# Patient Record
Sex: Female | Born: 1945 | Race: White | Hispanic: No | Marital: Married | Smoking: Former smoker
Health system: Southern US, Community
[De-identification: ages and names within clinical notes are randomized; demographics above are authoritative.]

## PROBLEM LIST (undated history)

## (undated) DIAGNOSIS — Z9221 Personal history of antineoplastic chemotherapy: Secondary | ICD-10-CM

## (undated) DIAGNOSIS — E1165 Type 2 diabetes mellitus with hyperglycemia: Secondary | ICD-10-CM

## (undated) DIAGNOSIS — E785 Hyperlipidemia, unspecified: Secondary | ICD-10-CM

## (undated) DIAGNOSIS — R519 Headache, unspecified: Secondary | ICD-10-CM

## (undated) DIAGNOSIS — F419 Anxiety disorder, unspecified: Secondary | ICD-10-CM

## (undated) DIAGNOSIS — M7710 Lateral epicondylitis, unspecified elbow: Secondary | ICD-10-CM

## (undated) DIAGNOSIS — C4490 Unspecified malignant neoplasm of skin, unspecified: Secondary | ICD-10-CM

## (undated) DIAGNOSIS — C189 Malignant neoplasm of colon, unspecified: Secondary | ICD-10-CM

## (undated) DIAGNOSIS — A809 Acute poliomyelitis, unspecified: Secondary | ICD-10-CM

## (undated) DIAGNOSIS — I700 Atherosclerosis of aorta: Secondary | ICD-10-CM

## (undated) DIAGNOSIS — G4730 Sleep apnea, unspecified: Secondary | ICD-10-CM

## (undated) DIAGNOSIS — E119 Type 2 diabetes mellitus without complications: Secondary | ICD-10-CM

## (undated) DIAGNOSIS — I10 Essential (primary) hypertension: Secondary | ICD-10-CM

## (undated) DIAGNOSIS — G4733 Obstructive sleep apnea (adult) (pediatric): Secondary | ICD-10-CM

## (undated) HISTORY — PX: COLON SURGERY: SHX602

## (undated) HISTORY — PX: CERVICAL DISC SURGERY: SHX588

## (undated) HISTORY — PX: TUBAL LIGATION: SHX77

## (undated) HISTORY — DX: Hyperlipidemia, unspecified: E78.5

## (undated) HISTORY — PX: PORT A CATH INJECTION (ARMC HX): HXRAD1731

## (undated) HISTORY — DX: Malignant neoplasm of colon, unspecified: C18.9

## (undated) HISTORY — DX: Type 2 diabetes mellitus without complications: E11.9

## (undated) HISTORY — PX: ESOPHAGOGASTRODUODENOSCOPY: SHX1529

## (undated) HISTORY — PX: CHOLECYSTECTOMY: SHX55

## (undated) HISTORY — PX: HERNIA REPAIR: SHX51

## (undated) HISTORY — DX: Essential (primary) hypertension: I10

## (undated) HISTORY — DX: Unspecified malignant neoplasm of skin, unspecified: C44.90

## (undated) HISTORY — PX: COLONOSCOPY: SHX174

## (undated) HISTORY — PX: APPENDECTOMY: SHX54

## (undated) HISTORY — PX: BACK SURGERY: SHX140

---

## 2004-05-02 ENCOUNTER — Ambulatory Visit: Payer: Self-pay | Admitting: Internal Medicine

## 2004-06-30 ENCOUNTER — Ambulatory Visit: Payer: Self-pay | Admitting: Internal Medicine

## 2005-05-09 ENCOUNTER — Ambulatory Visit: Payer: Self-pay

## 2006-05-31 ENCOUNTER — Ambulatory Visit: Payer: Self-pay | Admitting: Internal Medicine

## 2006-06-07 ENCOUNTER — Ambulatory Visit: Payer: Self-pay | Admitting: Internal Medicine

## 2006-06-16 ENCOUNTER — Ambulatory Visit: Payer: Self-pay | Admitting: Internal Medicine

## 2006-07-17 ENCOUNTER — Ambulatory Visit: Payer: Self-pay | Admitting: Internal Medicine

## 2006-08-29 ENCOUNTER — Ambulatory Visit: Payer: Self-pay | Admitting: Internal Medicine

## 2006-09-16 ENCOUNTER — Ambulatory Visit: Payer: Self-pay | Admitting: Internal Medicine

## 2007-07-24 ENCOUNTER — Ambulatory Visit: Payer: Self-pay | Admitting: Family Medicine

## 2007-12-09 ENCOUNTER — Ambulatory Visit: Payer: Self-pay | Admitting: Internal Medicine

## 2008-02-15 ENCOUNTER — Other Ambulatory Visit: Payer: Self-pay

## 2008-02-15 ENCOUNTER — Inpatient Hospital Stay: Payer: Self-pay | Admitting: Surgery

## 2008-02-15 ENCOUNTER — Ambulatory Visit: Payer: Self-pay | Admitting: Internal Medicine

## 2008-12-10 ENCOUNTER — Ambulatory Visit: Payer: Self-pay | Admitting: Internal Medicine

## 2008-12-14 ENCOUNTER — Ambulatory Visit: Payer: Self-pay | Admitting: Family Medicine

## 2009-05-11 ENCOUNTER — Ambulatory Visit: Payer: Self-pay | Admitting: Internal Medicine

## 2009-09-24 ENCOUNTER — Ambulatory Visit: Payer: Self-pay | Admitting: Internal Medicine

## 2010-03-22 ENCOUNTER — Ambulatory Visit: Payer: Self-pay | Admitting: Internal Medicine

## 2010-03-28 ENCOUNTER — Ambulatory Visit: Payer: Self-pay

## 2010-04-04 ENCOUNTER — Emergency Department: Payer: Self-pay | Admitting: Emergency Medicine

## 2010-04-04 ENCOUNTER — Ambulatory Visit: Payer: Self-pay | Admitting: Internal Medicine

## 2010-04-05 ENCOUNTER — Emergency Department: Payer: Self-pay | Admitting: Emergency Medicine

## 2010-04-13 ENCOUNTER — Ambulatory Visit: Payer: Self-pay | Admitting: Neurology

## 2010-04-25 ENCOUNTER — Ambulatory Visit: Payer: Self-pay | Admitting: Neurology

## 2010-04-29 ENCOUNTER — Ambulatory Visit: Payer: Self-pay | Admitting: Neurology

## 2010-05-19 ENCOUNTER — Ambulatory Visit: Payer: Self-pay | Admitting: Neurology

## 2010-06-23 ENCOUNTER — Encounter: Payer: Self-pay | Admitting: Rheumatology

## 2010-07-17 ENCOUNTER — Encounter: Payer: Self-pay | Admitting: Rheumatology

## 2011-04-25 ENCOUNTER — Ambulatory Visit: Payer: Self-pay

## 2011-04-25 LAB — COMPREHENSIVE METABOLIC PANEL
Albumin: 4 g/dL (ref 3.4–5.0)
Anion Gap: 8 (ref 7–16)
BUN: 16 mg/dL (ref 7–18)
Bilirubin,Total: 0.3 mg/dL (ref 0.2–1.0)
Creatinine: 1.02 mg/dL (ref 0.60–1.30)
EGFR (African American): >60
Glucose: 153 mg/dL — ABNORMAL HIGH (ref 65–99)
Osmolality: 284 (ref 275–301)
Potassium: 4.2 mmol/L (ref 3.5–5.1)
Sodium: 140 mmol/L (ref 136–145)
Total Protein: 7.3 g/dL (ref 6.4–8.2)

## 2011-04-25 LAB — CBC WITH DIFFERENTIAL/PLATELET
Basophil #: 0 10*3/uL (ref 0.0–0.1)
Basophil %: 0.5 %
Eosinophil #: 0.3 10*3/uL (ref 0.0–0.7)
Eosinophil %: 3.9 %
HGB: 15.3 g/dL (ref 12.0–16.0)
Lymphocyte #: 1.9 10*3/uL (ref 1.0–3.6)
MCH: 28.9 pg (ref 26.0–34.0)
MCHC: 32.9 g/dL (ref 32.0–36.0)
Neutrophil #: 4.5 10*3/uL (ref 1.4–6.5)
Neutrophil %: 60.9 %
Platelet: 187 10*3/uL (ref 150–440)
RBC: 5.27 10*6/uL — ABNORMAL HIGH (ref 3.80–5.20)
RDW: 13 % (ref 11.5–14.5)

## 2011-04-25 LAB — HEMOGLOBIN A1C: Hemoglobin A1C: 8.2 % — ABNORMAL HIGH (ref 4.2–6.3)

## 2011-08-03 ENCOUNTER — Ambulatory Visit: Payer: Self-pay

## 2011-09-29 ENCOUNTER — Ambulatory Visit: Payer: Self-pay | Admitting: Unknown Physician Specialty

## 2011-10-03 LAB — PATHOLOGY REPORT

## 2011-10-18 ENCOUNTER — Ambulatory Visit: Payer: Self-pay | Admitting: Surgery

## 2011-10-18 LAB — CBC WITH DIFFERENTIAL/PLATELET
Basophil %: 0.6 %
Eosinophil #: 0.2 10*3/uL (ref 0.0–0.7)
Eosinophil %: 3.2 %
HCT: 44.3 % (ref 35.0–47.0)
HGB: 14.8 g/dL (ref 12.0–16.0)
Lymphocyte %: 27.8 %
MCH: 29.3 pg (ref 26.0–34.0)
MCHC: 33.4 g/dL (ref 32.0–36.0)
Monocyte %: 9.4 %
Neutrophil #: 4.5 10*3/uL (ref 1.4–6.5)
Neutrophil %: 59 %
RBC: 5.04 10*6/uL (ref 3.80–5.20)

## 2011-10-18 LAB — BASIC METABOLIC PANEL
Anion Gap: 9 (ref 7–16)
BUN: 18 mg/dL (ref 7–18)
Calcium, Total: 8.9 mg/dL (ref 8.5–10.1)
Chloride: 105 mmol/L (ref 98–107)
Co2: 27 mmol/L (ref 21–32)
Creatinine: 1.09 mg/dL (ref 0.60–1.30)
EGFR (Non-African Amer.): 53 — ABNORMAL LOW
Glucose: 174 mg/dL — ABNORMAL HIGH (ref 65–99)
Osmolality: 287 (ref 275–301)
Sodium: 141 mmol/L (ref 136–145)

## 2011-10-24 ENCOUNTER — Inpatient Hospital Stay: Payer: Self-pay | Admitting: Surgery

## 2011-10-25 LAB — BASIC METABOLIC PANEL
Anion Gap: 8 (ref 7–16)
BUN: 9 mg/dL (ref 7–18)
Calcium, Total: 7.7 mg/dL — ABNORMAL LOW (ref 8.5–10.1)
Creatinine: 0.97 mg/dL (ref 0.60–1.30)
EGFR (African American): >60
EGFR (Non-African Amer.): >60
Glucose: 120 mg/dL — ABNORMAL HIGH (ref 65–99)
Osmolality: 276 (ref 275–301)
Potassium: 4.1 mmol/L (ref 3.5–5.1)

## 2011-10-25 LAB — CBC WITH DIFFERENTIAL/PLATELET
Basophil #: 0 10*3/uL (ref 0.0–0.1)
HCT: 40.1 % (ref 35.0–47.0)
Lymphocyte %: 16.1 %
MCH: 29.3 pg (ref 26.0–34.0)
MCHC: 33.7 g/dL (ref 32.0–36.0)
MCV: 87 fL (ref 80–100)
Monocyte #: 1.2 x10 3/mm — ABNORMAL HIGH (ref 0.2–0.9)
Neutrophil #: 7.6 10*3/uL — ABNORMAL HIGH (ref 1.4–6.5)
RDW: 13.7 % (ref 11.5–14.5)

## 2011-10-26 LAB — CBC WITH DIFFERENTIAL/PLATELET
Basophil #: 0 10*3/uL (ref 0.0–0.1)
Basophil %: 0.4 %
Eosinophil #: 0.2 10*3/uL (ref 0.0–0.7)
HGB: 13.7 g/dL (ref 12.0–16.0)
Lymphocyte #: 1.3 10*3/uL (ref 1.0–3.6)
MCH: 29.4 pg (ref 26.0–34.0)
MCHC: 33.8 g/dL (ref 32.0–36.0)
MCV: 87 fL (ref 80–100)
Monocyte #: 0.9 x10 3/mm (ref 0.2–0.9)
Neutrophil %: 72 %
Platelet: 165 10*3/uL (ref 150–440)

## 2011-10-26 LAB — BASIC METABOLIC PANEL
BUN: 8 mg/dL (ref 7–18)
Chloride: 103 mmol/L (ref 98–107)
Co2: 24 mmol/L (ref 21–32)
Creatinine: 0.87 mg/dL (ref 0.60–1.30)
EGFR (African American): >60
EGFR (Non-African Amer.): >60
Potassium: 4.2 mmol/L (ref 3.5–5.1)
Sodium: 138 mmol/L (ref 136–145)

## 2011-10-29 LAB — CBC WITH DIFFERENTIAL/PLATELET
Basophil #: 0 10*3/uL (ref 0.0–0.1)
Basophil %: 0.3 %
Eosinophil #: 0.3 10*3/uL (ref 0.0–0.7)
Eosinophil %: 3.6 %
HCT: 40.7 % (ref 35.0–47.0)
Lymphocyte #: 1.2 10*3/uL (ref 1.0–3.6)
Lymphocyte %: 13.2 %
MCH: 29 pg (ref 26.0–34.0)
MCHC: 33.2 g/dL (ref 32.0–36.0)
Monocyte #: 0.9 x10 3/mm (ref 0.2–0.9)
Neutrophil %: 72.5 %
RBC: 4.65 10*6/uL (ref 3.80–5.20)
RDW: 13.3 % (ref 11.5–14.5)
WBC: 9 10*3/uL (ref 3.6–11.0)

## 2011-10-29 LAB — BASIC METABOLIC PANEL
Anion Gap: 11 (ref 7–16)
Co2: 23 mmol/L (ref 21–32)
Creatinine: 0.79 mg/dL (ref 0.60–1.30)
EGFR (African American): >60
EGFR (Non-African Amer.): >60
Glucose: 120 mg/dL — ABNORMAL HIGH (ref 65–99)
Osmolality: 278 (ref 275–301)
Sodium: 139 mmol/L (ref 136–145)

## 2011-10-30 LAB — PATHOLOGY REPORT

## 2011-11-14 ENCOUNTER — Ambulatory Visit: Payer: Self-pay | Admitting: Internal Medicine

## 2011-11-14 LAB — HEPATIC FUNCTION PANEL A (ARMC)
Albumin: 3.8 g/dL (ref 3.4–5.0)
Alkaline Phosphatase: 66 U/L (ref 50–136)
Bilirubin, Direct: 0.1 mg/dL (ref 0.00–0.20)
Bilirubin,Total: 0.3 mg/dL (ref 0.2–1.0)
SGPT (ALT): 38 U/L

## 2011-11-14 LAB — CREATININE, SERUM
Creatinine: 1.09 mg/dL (ref 0.60–1.30)
EGFR (African American): >60

## 2011-11-16 ENCOUNTER — Ambulatory Visit: Payer: Self-pay | Admitting: Internal Medicine

## 2011-11-16 LAB — CREATININE, SERUM
Creatinine: 1.19 mg/dL (ref 0.60–1.30)
EGFR (African American): 55 — ABNORMAL LOW
EGFR (Non-African Amer.): 48 — ABNORMAL LOW

## 2011-11-20 LAB — CREATININE, SERUM
EGFR (African American): 43 — ABNORMAL LOW
EGFR (Non-African Amer.): 37 — ABNORMAL LOW

## 2011-11-21 LAB — BASIC METABOLIC PANEL
Anion Gap: 10 (ref 7–16)
BUN: 18 mg/dL (ref 7–18)
Chloride: 104 mmol/L (ref 98–107)
Co2: 28 mmol/L (ref 21–32)
Creatinine: 1.5 mg/dL — ABNORMAL HIGH (ref 0.60–1.30)
EGFR (African American): 42 — ABNORMAL LOW
Glucose: 56 mg/dL — ABNORMAL LOW (ref 65–99)
Osmolality: 283 (ref 275–301)

## 2011-11-21 LAB — URIC ACID: Uric Acid: 5.3 mg/dL (ref 2.6–6.0)

## 2011-11-22 LAB — CREATININE, SERUM: EGFR (Non-African Amer.): 42 — ABNORMAL LOW

## 2011-11-24 LAB — CREATININE, SERUM: EGFR (Non-African Amer.): 46 — ABNORMAL LOW

## 2011-12-08 ENCOUNTER — Ambulatory Visit: Payer: Self-pay | Admitting: Surgery

## 2011-12-12 LAB — COMPREHENSIVE METABOLIC PANEL
Alkaline Phosphatase: 61 U/L (ref 50–136)
BUN: 20 mg/dL — ABNORMAL HIGH (ref 7–18)
Calcium, Total: 8.8 mg/dL (ref 8.5–10.1)
Chloride: 103 mmol/L (ref 98–107)
Co2: 30 mmol/L (ref 21–32)
Creatinine: 1.05 mg/dL (ref 0.60–1.30)
EGFR (African American): >60
EGFR (Non-African Amer.): 55 — ABNORMAL LOW
Glucose: 157 mg/dL — ABNORMAL HIGH (ref 65–99)
Potassium: 3.9 mmol/L (ref 3.5–5.1)
SGOT(AST): 18 U/L (ref 15–37)
SGPT (ALT): 31 U/L (ref 12–78)
Total Protein: 7.1 g/dL (ref 6.4–8.2)

## 2011-12-12 LAB — CBC CANCER CENTER
Basophil #: 0 x10 3/mm (ref 0.0–0.1)
Eosinophil #: 0.4 x10 3/mm (ref 0.0–0.7)
Lymphocyte %: 24.2 %
MCH: 28.6 pg (ref 26.0–34.0)
MCHC: 32.9 g/dL (ref 32.0–36.0)
MCV: 87 fL (ref 80–100)
Monocyte #: 0.9 x10 3/mm (ref 0.2–0.9)
Neutrophil %: 60 %
Platelet: 195 x10 3/mm (ref 150–440)
RDW: 14.2 % (ref 11.5–14.5)

## 2011-12-17 ENCOUNTER — Ambulatory Visit: Payer: Self-pay | Admitting: Internal Medicine

## 2011-12-19 LAB — CBC CANCER CENTER
Basophil #: 0 x10 3/mm (ref 0.0–0.1)
Eosinophil #: 0.5 x10 3/mm (ref 0.0–0.7)
Lymphocyte #: 1.9 x10 3/mm (ref 1.0–3.6)
MCH: 28.3 pg (ref 26.0–34.0)
MCHC: 32.6 g/dL (ref 32.0–36.0)
MCV: 87 fL (ref 80–100)
Monocyte #: 0.4 x10 3/mm (ref 0.2–0.9)
Monocyte %: 6 %
Neutrophil %: 58.9 %
Platelet: 179 x10 3/mm (ref 150–440)
RBC: 4.75 10*6/uL (ref 3.80–5.20)
RDW: 14.2 % (ref 11.5–14.5)
WBC: 6.9 x10 3/mm (ref 3.6–11.0)

## 2011-12-19 LAB — CREATININE, SERUM
Creatinine: 1.22 mg/dL (ref 0.60–1.30)
EGFR (African American): 53 — ABNORMAL LOW
EGFR (Non-African Amer.): 46 — ABNORMAL LOW

## 2011-12-19 LAB — POTASSIUM: Potassium: 4 mmol/L (ref 3.5–5.1)

## 2011-12-19 LAB — MAGNESIUM: Magnesium: 1.8 mg/dL

## 2011-12-26 LAB — CBC CANCER CENTER
Basophil #: 0 x10 3/mm (ref 0.0–0.1)
Basophil %: 0.8 %
Eosinophil %: 4.7 %
HCT: 41.3 % (ref 35.0–47.0)
Lymphocyte #: 1.4 x10 3/mm (ref 1.0–3.6)
MCV: 86 fL (ref 80–100)
Monocyte %: 15.6 %
Neutrophil #: 2.3 x10 3/mm (ref 1.4–6.5)
RBC: 4.8 10*6/uL (ref 3.80–5.20)
WBC: 4.7 x10 3/mm (ref 3.6–11.0)

## 2011-12-26 LAB — COMPREHENSIVE METABOLIC PANEL
Albumin: 3.7 g/dL (ref 3.4–5.0)
Alkaline Phosphatase: 56 U/L (ref 50–136)
Anion Gap: 10 (ref 7–16)
BUN: 13 mg/dL (ref 7–18)
Bilirubin,Total: 0.4 mg/dL (ref 0.2–1.0)
Calcium, Total: 8.7 mg/dL (ref 8.5–10.1)
Chloride: 104 mmol/L (ref 98–107)
Co2: 29 mmol/L (ref 21–32)
Creatinine: 1.08 mg/dL (ref 0.60–1.30)
EGFR (Non-African Amer.): 53 — ABNORMAL LOW
Potassium: 3 mmol/L — ABNORMAL LOW (ref 3.5–5.1)
SGOT(AST): 23 U/L (ref 15–37)
SGPT (ALT): 39 U/L (ref 12–78)
Total Protein: 6.7 g/dL (ref 6.4–8.2)

## 2011-12-26 LAB — MAGNESIUM: Magnesium: 1.6 mg/dL — ABNORMAL LOW

## 2012-01-02 LAB — CBC CANCER CENTER
Basophil #: 0 x10 3/mm (ref 0.0–0.1)
Basophil %: 0.3 %
Eosinophil #: 0.4 x10 3/mm (ref 0.0–0.7)
HCT: 46 % (ref 35.0–47.0)
HGB: 14.9 g/dL (ref 12.0–16.0)
Lymphocyte %: 22 %
MCH: 28.3 pg (ref 26.0–34.0)
MCHC: 32.5 g/dL (ref 32.0–36.0)
MCV: 87 fL (ref 80–100)
Monocyte #: 0.7 x10 3/mm (ref 0.2–0.9)
Monocyte %: 8.4 %
Neutrophil #: 5.5 x10 3/mm (ref 1.4–6.5)
Neutrophil %: 64.4 %
RBC: 5.29 10*6/uL — ABNORMAL HIGH (ref 3.80–5.20)
RDW: 14.8 % — ABNORMAL HIGH (ref 11.5–14.5)

## 2012-01-02 LAB — POTASSIUM: Potassium: 4.8 mmol/L (ref 3.5–5.1)

## 2012-01-02 LAB — CREATININE, SERUM
EGFR (African American): 45 — ABNORMAL LOW
EGFR (Non-African Amer.): 39 — ABNORMAL LOW

## 2012-01-09 LAB — COMPREHENSIVE METABOLIC PANEL
Albumin: 3.1 g/dL — ABNORMAL LOW (ref 3.4–5.0)
Anion Gap: 9 (ref 7–16)
BUN: 11 mg/dL (ref 7–18)
Bilirubin,Total: 0.4 mg/dL (ref 0.2–1.0)
Calcium, Total: 8.5 mg/dL (ref 8.5–10.1)
Co2: 31 mmol/L (ref 21–32)
EGFR (African American): 56 — ABNORMAL LOW
EGFR (Non-African Amer.): 49 — ABNORMAL LOW
Glucose: 169 mg/dL — ABNORMAL HIGH (ref 65–99)
Osmolality: 290 (ref 275–301)
Potassium: 2.9 mmol/L — ABNORMAL LOW (ref 3.5–5.1)
SGOT(AST): 14 U/L — ABNORMAL LOW (ref 15–37)
Sodium: 144 mmol/L (ref 136–145)

## 2012-01-09 LAB — CBC WITH DIFFERENTIAL/PLATELET
Basophil #: 0 10*3/uL (ref 0.0–0.1)
Basophil %: 0.4 %
Eosinophil #: 0.2 10*3/uL (ref 0.0–0.7)
Eosinophil %: 2.6 %
HGB: 12.7 g/dL (ref 12.0–16.0)
Lymphocyte #: 1.8 10*3/uL (ref 1.0–3.6)
Lymphocyte %: 25 %
MCHC: 33 g/dL (ref 32.0–36.0)
MCV: 86 fL (ref 80–100)
Neutrophil %: 52.1 %
Platelet: 180 10*3/uL (ref 150–440)
RBC: 4.46 10*6/uL (ref 3.80–5.20)

## 2012-01-11 LAB — CBC CANCER CENTER
Basophil #: 0 x10 3/mm (ref 0.0–0.1)
Eosinophil #: 0.2 x10 3/mm (ref 0.0–0.7)
Lymphocyte #: 1.9 x10 3/mm (ref 1.0–3.6)
Lymphocyte %: 27.6 %
MCH: 28.5 pg (ref 26.0–34.0)
MCHC: 33.4 g/dL (ref 32.0–36.0)
MCV: 85 fL (ref 80–100)
Monocyte #: 1.4 x10 3/mm — ABNORMAL HIGH (ref 0.2–0.9)
Neutrophil #: 3.4 x10 3/mm (ref 1.4–6.5)
Neutrophil %: 48.8 %
Platelet: 196 x10 3/mm (ref 150–440)
RBC: 4.51 10*6/uL (ref 3.80–5.20)
RDW: 14.8 % — ABNORMAL HIGH (ref 11.5–14.5)

## 2012-01-11 LAB — POTASSIUM: Potassium: 3.3 mmol/L — ABNORMAL LOW (ref 3.5–5.1)

## 2012-01-16 ENCOUNTER — Ambulatory Visit: Payer: Self-pay | Admitting: Internal Medicine

## 2012-01-23 LAB — CBC CANCER CENTER
Basophil %: 0.6 %
Eosinophil %: 2 %
HGB: 12.7 g/dL (ref 12.0–16.0)
Lymphocyte #: 1.4 x10 3/mm (ref 1.0–3.6)
Lymphocyte %: 24.5 %
MCH: 28.3 pg (ref 26.0–34.0)
MCV: 87 fL (ref 80–100)
Monocyte #: 1.1 x10 3/mm — ABNORMAL HIGH (ref 0.2–0.9)
Neutrophil %: 53.7 %
RBC: 4.49 10*6/uL (ref 3.80–5.20)

## 2012-01-23 LAB — COMPREHENSIVE METABOLIC PANEL
Alkaline Phosphatase: 79 U/L (ref 50–136)
Anion Gap: 9 (ref 7–16)
BUN: 14 mg/dL (ref 7–18)
Bilirubin,Total: 0.4 mg/dL (ref 0.2–1.0)
Calcium, Total: 8.2 mg/dL — ABNORMAL LOW (ref 8.5–10.1)
Chloride: 108 mmol/L — ABNORMAL HIGH (ref 98–107)
EGFR (African American): >60
Glucose: 140 mg/dL — ABNORMAL HIGH (ref 65–99)
Potassium: 3.7 mmol/L (ref 3.5–5.1)
SGOT(AST): 20 U/L (ref 15–37)
SGPT (ALT): 32 U/L (ref 12–78)
Total Protein: 6.3 g/dL — ABNORMAL LOW (ref 6.4–8.2)

## 2012-01-23 LAB — MAGNESIUM: Magnesium: 1.9 mg/dL

## 2012-01-30 LAB — BASIC METABOLIC PANEL
Anion Gap: 12 (ref 7–16)
BUN: 12 mg/dL (ref 7–18)
Chloride: 105 mmol/L (ref 98–107)
Creatinine: 0.99 mg/dL (ref 0.60–1.30)
EGFR (African American): >60
EGFR (Non-African Amer.): 59 — ABNORMAL LOW
Osmolality: 290 (ref 275–301)
Potassium: 3.7 mmol/L (ref 3.5–5.1)
Sodium: 142 mmol/L (ref 136–145)

## 2012-01-30 LAB — CBC CANCER CENTER
Basophil #: 0 x10 3/mm (ref 0.0–0.1)
Eosinophil #: 0.2 x10 3/mm (ref 0.0–0.7)
Lymphocyte #: 1.8 x10 3/mm (ref 1.0–3.6)
MCH: 29.3 pg (ref 26.0–34.0)
MCV: 88 fL (ref 80–100)
Monocyte #: 1.1 x10 3/mm — ABNORMAL HIGH (ref 0.2–0.9)
Neutrophil %: 37.1 %
Platelet: 183 x10 3/mm (ref 150–440)
RDW: 17.1 % — ABNORMAL HIGH (ref 11.5–14.5)
WBC: 4.9 x10 3/mm (ref 3.6–11.0)

## 2012-02-06 LAB — CBC CANCER CENTER
Basophil #: 0 x10 3/mm (ref 0.0–0.1)
Basophil %: 0.5 %
Eosinophil #: 0.3 x10 3/mm (ref 0.0–0.7)
HGB: 14.6 g/dL (ref 12.0–16.0)
MCH: 29.5 pg (ref 26.0–34.0)
MCHC: 33.6 g/dL (ref 32.0–36.0)
MCV: 88 fL (ref 80–100)
Monocyte #: 0.6 x10 3/mm (ref 0.2–0.9)
Neutrophil %: 67.3 %
RBC: 4.93 10*6/uL (ref 3.80–5.20)
RDW: 16.7 % — ABNORMAL HIGH (ref 11.5–14.5)
WBC: 8.9 x10 3/mm (ref 3.6–11.0)

## 2012-02-06 LAB — MAGNESIUM: Magnesium: 2.1 mg/dL

## 2012-02-06 LAB — POTASSIUM: Potassium: 4.3 mmol/L (ref 3.5–5.1)

## 2012-02-09 LAB — CBC CANCER CENTER
Basophil %: 0.2 %
Eosinophil #: 0.1 x10 3/mm (ref 0.0–0.7)
Eosinophil %: 1.1 %
Lymphocyte %: 27.7 %
MCH: 29 pg (ref 26.0–34.0)
Monocyte #: 1.2 x10 3/mm — ABNORMAL HIGH (ref 0.2–0.9)
Monocyte %: 15.2 %
Neutrophil %: 55.8 %
Platelet: 137 x10 3/mm — ABNORMAL LOW (ref 150–440)
RBC: 4.59 10*6/uL (ref 3.80–5.20)
WBC: 8.2 x10 3/mm (ref 3.6–11.0)

## 2012-02-13 LAB — CBC CANCER CENTER
Basophil #: 0 x10 3/mm (ref 0.0–0.1)
Basophil %: 0.5 %
Eosinophil %: 2.7 %
MCH: 28.9 pg (ref 26.0–34.0)
MCHC: 32.9 g/dL (ref 32.0–36.0)
Monocyte #: 1.1 x10 3/mm — ABNORMAL HIGH (ref 0.2–0.9)
Neutrophil %: 52.5 %
RDW: 17.9 % — ABNORMAL HIGH (ref 11.5–14.5)

## 2012-02-13 LAB — COMPREHENSIVE METABOLIC PANEL
Anion Gap: 10 (ref 7–16)
BUN: 8 mg/dL (ref 7–18)
Calcium, Total: 8.1 mg/dL — ABNORMAL LOW (ref 8.5–10.1)
Chloride: 105 mmol/L (ref 98–107)
Co2: 25 mmol/L (ref 21–32)
Creatinine: 1.05 mg/dL (ref 0.60–1.30)
EGFR (African American): >60
EGFR (Non-African Amer.): 55 — ABNORMAL LOW
SGOT(AST): 23 U/L (ref 15–37)
SGPT (ALT): 33 U/L (ref 12–78)
Total Protein: 6.4 g/dL (ref 6.4–8.2)

## 2012-02-13 LAB — MAGNESIUM: Magnesium: 1.6 mg/dL — ABNORMAL LOW

## 2012-02-16 ENCOUNTER — Ambulatory Visit: Payer: Self-pay | Admitting: Internal Medicine

## 2012-02-20 LAB — CBC CANCER CENTER
Eosinophil #: 0.2 x10 3/mm (ref 0.0–0.7)
HCT: 39.7 % (ref 35.0–47.0)
HGB: 13.6 g/dL (ref 12.0–16.0)
Lymphocyte %: 34.9 %
MCH: 30.4 pg (ref 26.0–34.0)
Monocyte #: 1.3 x10 3/mm — ABNORMAL HIGH (ref 0.2–0.9)
Monocyte %: 20 %
Neutrophil #: 2.7 x10 3/mm (ref 1.4–6.5)
Neutrophil %: 40.8 %
Platelet: 202 x10 3/mm (ref 150–440)
RDW: 18.6 % — ABNORMAL HIGH (ref 11.5–14.5)
WBC: 6.6 x10 3/mm (ref 3.6–11.0)

## 2012-02-20 LAB — CREATININE, SERUM
EGFR (African American): >60
EGFR (Non-African Amer.): 58 — ABNORMAL LOW

## 2012-02-20 LAB — MAGNESIUM: Magnesium: 1.8 mg/dL

## 2012-02-20 LAB — POTASSIUM: Potassium: 4.3 mmol/L (ref 3.5–5.1)

## 2012-02-27 LAB — CBC CANCER CENTER
Basophil #: 0.1 x10 3/mm (ref 0.0–0.1)
Eosinophil #: 0.1 x10 3/mm (ref 0.0–0.7)
HCT: 42.7 % (ref 35.0–47.0)
Lymphocyte %: 29 %
Monocyte %: 10.4 %
Neutrophil #: 4.4 x10 3/mm (ref 1.4–6.5)
Neutrophil %: 58 %
Platelet: 170 x10 3/mm (ref 150–440)
RBC: 4.76 10*6/uL (ref 3.80–5.20)
WBC: 7.6 x10 3/mm (ref 3.6–11.0)

## 2012-02-27 LAB — POTASSIUM: Potassium: 4.6 mmol/L (ref 3.5–5.1)

## 2012-02-27 LAB — MAGNESIUM: Magnesium: 1.9 mg/dL

## 2012-03-05 LAB — CBC CANCER CENTER
Eosinophil #: 0.1 x10 3/mm (ref 0.0–0.7)
Eosinophil %: 1.9 %
MCHC: 33.3 g/dL (ref 32.0–36.0)
MCV: 91 fL (ref 80–100)
Monocyte %: 14.1 %
Neutrophil %: 58.2 %
Platelet: 147 x10 3/mm — ABNORMAL LOW (ref 150–440)
RBC: 4.33 10*6/uL (ref 3.80–5.20)
WBC: 6.6 x10 3/mm (ref 3.6–11.0)

## 2012-03-05 LAB — COMPREHENSIVE METABOLIC PANEL
Albumin: 3.5 g/dL (ref 3.4–5.0)
Alkaline Phosphatase: 83 U/L (ref 50–136)
Anion Gap: 11 (ref 7–16)
BUN: 16 mg/dL (ref 7–18)
Bilirubin,Total: 0.3 mg/dL (ref 0.2–1.0)
Chloride: 104 mmol/L (ref 98–107)
Co2: 27 mmol/L (ref 21–32)
Creatinine: 1.14 mg/dL (ref 0.60–1.30)
EGFR (Non-African Amer.): 50 — ABNORMAL LOW
Glucose: 286 mg/dL — ABNORMAL HIGH (ref 65–99)
SGPT (ALT): 39 U/L (ref 12–78)
Sodium: 142 mmol/L (ref 136–145)
Total Protein: 6.8 g/dL (ref 6.4–8.2)

## 2012-03-12 LAB — CBC CANCER CENTER
Basophil %: 1 %
Eosinophil #: 0.2 x10 3/mm (ref 0.0–0.7)
Eosinophil %: 3.5 %
HCT: 41 % (ref 35.0–47.0)
Lymphocyte #: 1.7 x10 3/mm (ref 1.0–3.6)
MCH: 30.4 pg (ref 26.0–34.0)
MCHC: 33.5 g/dL (ref 32.0–36.0)
MCV: 91 fL (ref 80–100)
Monocyte #: 1 x10 3/mm — ABNORMAL HIGH (ref 0.2–0.9)
Monocyte %: 16.5 %
Neutrophil #: 2.9 x10 3/mm (ref 1.4–6.5)
Platelet: 190 x10 3/mm (ref 150–440)
RBC: 4.52 10*6/uL (ref 3.80–5.20)

## 2012-03-17 ENCOUNTER — Ambulatory Visit: Payer: Self-pay | Admitting: Internal Medicine

## 2012-03-26 LAB — COMPREHENSIVE METABOLIC PANEL
Albumin: 3.9 g/dL (ref 3.4–5.0)
Anion Gap: 9 (ref 7–16)
BUN: 15 mg/dL (ref 7–18)
Bilirubin,Total: 0.4 mg/dL (ref 0.2–1.0)
Chloride: 101 mmol/L (ref 98–107)
Creatinine: 1.2 mg/dL (ref 0.60–1.30)
EGFR (African American): 54 — ABNORMAL LOW
Glucose: 240 mg/dL — ABNORMAL HIGH (ref 65–99)
Osmolality: 286 (ref 275–301)
Potassium: 4.7 mmol/L (ref 3.5–5.1)
SGOT(AST): 33 U/L (ref 15–37)
SGPT (ALT): 48 U/L (ref 12–78)
Total Protein: 7.4 g/dL (ref 6.4–8.2)

## 2012-03-26 LAB — CBC CANCER CENTER
Basophil %: 0.9 %
Eosinophil %: 2.5 %
HCT: 43.7 % (ref 35.0–47.0)
Lymphocyte #: 1.9 x10 3/mm (ref 1.0–3.6)
Lymphocyte %: 24.2 %
MCH: 29.5 pg (ref 26.0–34.0)
MCV: 90 fL (ref 80–100)
Monocyte #: 1 x10 3/mm — ABNORMAL HIGH (ref 0.2–0.9)
Monocyte %: 13.2 %
Neutrophil #: 4.7 x10 3/mm (ref 1.4–6.5)
Neutrophil %: 59.2 %

## 2012-03-26 LAB — MAGNESIUM: Magnesium: 2.1 mg/dL

## 2012-04-09 LAB — COMPREHENSIVE METABOLIC PANEL
Alkaline Phosphatase: 82 U/L (ref 50–136)
Anion Gap: 11 (ref 7–16)
BUN: 20 mg/dL — ABNORMAL HIGH (ref 7–18)
Chloride: 101 mmol/L (ref 98–107)
Creatinine: 1.25 mg/dL (ref 0.60–1.30)
EGFR (African American): 52 — ABNORMAL LOW
EGFR (Non-African Amer.): 44 — ABNORMAL LOW
Osmolality: 292 (ref 275–301)
Potassium: 4.4 mmol/L (ref 3.5–5.1)
SGOT(AST): 33 U/L (ref 15–37)
Total Protein: 7.2 g/dL (ref 6.4–8.2)

## 2012-04-09 LAB — CBC CANCER CENTER
Basophil #: 0.1 x10 3/mm (ref 0.0–0.1)
HGB: 14.5 g/dL (ref 12.0–16.0)
Lymphocyte #: 1.8 x10 3/mm (ref 1.0–3.6)
Lymphocyte %: 23.2 %
MCHC: 33.3 g/dL (ref 32.0–36.0)
Monocyte %: 13.7 %
Neutrophil %: 59.7 %
Platelet: 148 x10 3/mm — ABNORMAL LOW (ref 150–440)
RDW: 16.4 % — ABNORMAL HIGH (ref 11.5–14.5)
WBC: 7.6 x10 3/mm (ref 3.6–11.0)

## 2012-04-17 ENCOUNTER — Ambulatory Visit: Payer: Self-pay | Admitting: Internal Medicine

## 2012-04-23 LAB — COMPREHENSIVE METABOLIC PANEL
Albumin: 3.8 g/dL (ref 3.4–5.0)
Alkaline Phosphatase: 82 U/L (ref 50–136)
Chloride: 100 mmol/L (ref 98–107)
Co2: 26 mmol/L (ref 21–32)
EGFR (African American): 49 — ABNORMAL LOW
EGFR (Non-African Amer.): 42 — ABNORMAL LOW
Glucose: 369 mg/dL — ABNORMAL HIGH (ref 65–99)
Potassium: 4.5 mmol/L (ref 3.5–5.1)
SGOT(AST): 35 U/L (ref 15–37)
SGPT (ALT): 51 U/L (ref 12–78)
Sodium: 135 mmol/L — ABNORMAL LOW (ref 136–145)
Total Protein: 7 g/dL (ref 6.4–8.2)

## 2012-04-23 LAB — CBC CANCER CENTER
Basophil #: 0.1 x10 3/mm (ref 0.0–0.1)
Eosinophil %: 2.7 %
HCT: 42.2 % (ref 35.0–47.0)
HGB: 14.1 g/dL (ref 12.0–16.0)
MCHC: 33.4 g/dL (ref 32.0–36.0)
MCV: 93 fL (ref 80–100)
Monocyte #: 0.9 x10 3/mm (ref 0.2–0.9)
Monocyte %: 12.8 %
Neutrophil #: 4 x10 3/mm (ref 1.4–6.5)
RBC: 4.55 10*6/uL (ref 3.80–5.20)
RDW: 16.1 % — ABNORMAL HIGH (ref 11.5–14.5)
WBC: 6.8 x10 3/mm (ref 3.6–11.0)

## 2012-04-23 LAB — SEDIMENTATION RATE: Erythrocyte Sed Rate: 12 mm/hr (ref 0–30)

## 2012-05-07 LAB — CBC CANCER CENTER
Basophil #: 0.1 x10 3/mm (ref 0.0–0.1)
Basophil %: 0.6 %
Eosinophil #: 0.2 x10 3/mm (ref 0.0–0.7)
Eosinophil %: 1.9 %
HGB: 14.1 g/dL (ref 12.0–16.0)
Lymphocyte #: 1.3 x10 3/mm (ref 1.0–3.6)
Lymphocyte %: 14.5 %
MCH: 30.4 pg (ref 26.0–34.0)
Monocyte #: 1.2 x10 3/mm — ABNORMAL HIGH (ref 0.2–0.9)
Monocyte %: 12.8 %
Neutrophil #: 6.4 x10 3/mm (ref 1.4–6.5)
Neutrophil %: 70.2 %
RBC: 4.64 10*6/uL (ref 3.80–5.20)
WBC: 9.1 x10 3/mm (ref 3.6–11.0)

## 2012-05-07 LAB — COMPREHENSIVE METABOLIC PANEL
Albumin: 3.8 g/dL (ref 3.4–5.0)
Anion Gap: 12 (ref 7–16)
Chloride: 102 mmol/L (ref 98–107)
Co2: 23 mmol/L (ref 21–32)
Creatinine: 1.15 mg/dL (ref 0.60–1.30)
EGFR (African American): 57 — ABNORMAL LOW
EGFR (Non-African Amer.): 49 — ABNORMAL LOW
Glucose: 334 mg/dL — ABNORMAL HIGH (ref 65–99)
Osmolality: 289 (ref 275–301)
Potassium: 4.2 mmol/L (ref 3.5–5.1)
SGOT(AST): 35 U/L (ref 15–37)
SGPT (ALT): 51 U/L (ref 12–78)
Total Protein: 7 g/dL (ref 6.4–8.2)

## 2012-05-16 LAB — BASIC METABOLIC PANEL
Anion Gap: 13 (ref 7–16)
Chloride: 96 mmol/L — ABNORMAL LOW (ref 98–107)
Co2: 25 mmol/L (ref 21–32)
Creatinine: 1.41 mg/dL — ABNORMAL HIGH (ref 0.60–1.30)
EGFR (Non-African Amer.): 38 — ABNORMAL LOW
Osmolality: 287 (ref 275–301)
Potassium: 4.7 mmol/L (ref 3.5–5.1)
Sodium: 134 mmol/L — ABNORMAL LOW (ref 136–145)

## 2012-05-16 LAB — URINALYSIS, COMPLETE
Bacteria: NONE SEEN
Bilirubin,UR: NEGATIVE
Protein: NEGATIVE
Specific Gravity: 1.026 (ref 1.003–1.030)
Squamous Epithelial: <1
WBC UR: 1 /HPF (ref 0–5)

## 2012-05-18 ENCOUNTER — Ambulatory Visit: Payer: Self-pay | Admitting: Internal Medicine

## 2012-05-21 LAB — CBC CANCER CENTER
Basophil #: 0.1 x10 3/mm (ref 0.0–0.1)
Basophil %: 1.1 %
Eosinophil #: 0.2 x10 3/mm (ref 0.0–0.7)
Eosinophil %: 2 %
Lymphocyte #: 1.5 x10 3/mm (ref 1.0–3.6)
MCHC: 33.7 g/dL (ref 32.0–36.0)
MCV: 92 fL (ref 80–100)
Monocyte #: 1.1 x10 3/mm — ABNORMAL HIGH (ref 0.2–0.9)
Monocyte %: 13.6 %
Neutrophil #: 5.3 x10 3/mm (ref 1.4–6.5)
Neutrophil %: 64.9 %
Platelet: 150 x10 3/mm (ref 150–440)
RBC: 4.83 10*6/uL (ref 3.80–5.20)
RDW: 15.6 % — ABNORMAL HIGH (ref 11.5–14.5)

## 2012-05-21 LAB — COMPREHENSIVE METABOLIC PANEL
Alkaline Phosphatase: 80 U/L (ref 50–136)
BUN: 21 mg/dL — ABNORMAL HIGH (ref 7–18)
Bilirubin,Total: 0.4 mg/dL (ref 0.2–1.0)
Chloride: 100 mmol/L (ref 98–107)
EGFR (African American): 50 — ABNORMAL LOW
EGFR (Non-African Amer.): 43 — ABNORMAL LOW
Osmolality: 289 (ref 275–301)
Potassium: 4.5 mmol/L (ref 3.5–5.1)
SGOT(AST): 32 U/L (ref 15–37)

## 2012-05-28 LAB — CBC CANCER CENTER
Basophil %: 1.5 %
Eosinophil %: 1.9 %
HCT: 44.8 % (ref 35.0–47.0)
HGB: 15 g/dL (ref 12.0–16.0)
MCH: 30.7 pg (ref 26.0–34.0)
MCV: 92 fL (ref 80–100)
Monocyte #: 0.9 x10 3/mm (ref 0.2–0.9)
Neutrophil %: 62 %
Platelet: 161 x10 3/mm (ref 150–440)

## 2012-05-28 LAB — CREATININE, SERUM
Creatinine: 1.31 mg/dL — ABNORMAL HIGH (ref 0.60–1.30)
EGFR (African American): 49 — ABNORMAL LOW
EGFR (Non-African Amer.): 42 — ABNORMAL LOW

## 2012-05-28 LAB — HEPATIC FUNCTION PANEL A (ARMC)
Albumin: 4 g/dL (ref 3.4–5.0)
Alkaline Phosphatase: 82 U/L (ref 50–136)
Bilirubin,Total: 0.3 mg/dL (ref 0.2–1.0)
SGPT (ALT): 42 U/L (ref 12–78)
Total Protein: 7.3 g/dL (ref 6.4–8.2)

## 2012-06-11 LAB — COMPREHENSIVE METABOLIC PANEL
Alkaline Phosphatase: 67 U/L (ref 50–136)
Anion Gap: 9 (ref 7–16)
BUN: 19 mg/dL — ABNORMAL HIGH (ref 7–18)
Bilirubin,Total: 0.3 mg/dL (ref 0.2–1.0)
Co2: 28 mmol/L (ref 21–32)
EGFR (African American): 55 — ABNORMAL LOW
EGFR (Non-African Amer.): 48 — ABNORMAL LOW
Glucose: 214 mg/dL — ABNORMAL HIGH (ref 65–99)
Potassium: 4.4 mmol/L (ref 3.5–5.1)
SGOT(AST): 34 U/L (ref 15–37)
Total Protein: 7.1 g/dL (ref 6.4–8.2)

## 2012-06-11 LAB — CBC CANCER CENTER
Eosinophil %: 3 %
HCT: 46.2 % (ref 35.0–47.0)
HGB: 15.2 g/dL (ref 12.0–16.0)
Lymphocyte %: 23 %
MCHC: 32.9 g/dL (ref 32.0–36.0)
Monocyte #: 0.9 x10 3/mm (ref 0.2–0.9)
Monocyte %: 11.8 %
Neutrophil %: 61.5 %
Platelet: 147 x10 3/mm — ABNORMAL LOW (ref 150–440)
RBC: 5.03 10*6/uL (ref 3.80–5.20)
RDW: 15.1 % — ABNORMAL HIGH (ref 11.5–14.5)

## 2012-06-15 ENCOUNTER — Ambulatory Visit: Payer: Self-pay | Admitting: Internal Medicine

## 2012-06-25 LAB — CBC CANCER CENTER
Basophil %: 1 %
Eosinophil #: 0.2 x10 3/mm (ref 0.0–0.7)
Eosinophil %: 3.3 %
HCT: 43.6 % (ref 35.0–47.0)
HGB: 14.4 g/dL (ref 12.0–16.0)
MCHC: 33.1 g/dL (ref 32.0–36.0)
MCV: 91 fL (ref 80–100)
Monocyte #: 0.9 x10 3/mm (ref 0.2–0.9)
Monocyte %: 12.4 %
Neutrophil %: 60.8 %
RBC: 4.77 10*6/uL (ref 3.80–5.20)
WBC: 6.9 x10 3/mm (ref 3.6–11.0)

## 2012-06-25 LAB — COMPREHENSIVE METABOLIC PANEL
Albumin: 3.8 g/dL (ref 3.4–5.0)
Alkaline Phosphatase: 74 U/L (ref 50–136)
Anion Gap: 10 (ref 7–16)
Bilirubin,Total: 0.4 mg/dL (ref 0.2–1.0)
Calcium, Total: 9.2 mg/dL (ref 8.5–10.1)
Chloride: 101 mmol/L (ref 98–107)
Co2: 28 mmol/L (ref 21–32)
Creatinine: 1.14 mg/dL (ref 0.60–1.30)
EGFR (African American): 58 — ABNORMAL LOW
EGFR (Non-African Amer.): 50 — ABNORMAL LOW
Osmolality: 288 (ref 275–301)
Potassium: 4.3 mmol/L (ref 3.5–5.1)
Sodium: 139 mmol/L (ref 136–145)
Total Protein: 7.1 g/dL (ref 6.4–8.2)

## 2012-07-02 ENCOUNTER — Ambulatory Visit: Payer: Self-pay | Admitting: Surgery

## 2012-07-04 ENCOUNTER — Ambulatory Visit: Payer: Self-pay

## 2012-07-04 LAB — LIPID PANEL
HDL Cholesterol: 26 mg/dL — ABNORMAL LOW (ref 40–60)
Ldl Cholesterol, Calc: 47 mg/dL (ref 0–100)
Triglycerides: 275 mg/dL — ABNORMAL HIGH (ref 0–200)

## 2012-07-04 LAB — CBC WITH DIFFERENTIAL/PLATELET
Basophil #: 0 10*3/uL (ref 0.0–0.1)
Basophil %: 0.6 %
Eosinophil %: 2.3 %
HCT: 44.3 % (ref 35.0–47.0)
Lymphocyte #: 1.6 10*3/uL (ref 1.0–3.6)
Monocyte #: 0.7 x10 3/mm (ref 0.2–0.9)
Neutrophil #: 4.6 10*3/uL (ref 1.4–6.5)
RDW: 15 % — ABNORMAL HIGH (ref 11.5–14.5)
WBC: 7 10*3/uL (ref 3.6–11.0)

## 2012-07-04 LAB — COMPREHENSIVE METABOLIC PANEL
Albumin: 3.8 g/dL (ref 3.4–5.0)
Anion Gap: 12 (ref 7–16)
BUN: 19 mg/dL — ABNORMAL HIGH (ref 7–18)
Co2: 26 mmol/L (ref 21–32)
EGFR (African American): 52 — ABNORMAL LOW
EGFR (Non-African Amer.): 44 — ABNORMAL LOW
SGOT(AST): 33 U/L (ref 15–37)
SGPT (ALT): 48 U/L (ref 12–78)
Total Protein: 7.1 g/dL (ref 6.4–8.2)

## 2012-07-04 LAB — TSH: Thyroid Stimulating Horm: 2.08 u[IU]/mL

## 2012-07-16 ENCOUNTER — Ambulatory Visit: Payer: Self-pay | Admitting: Internal Medicine

## 2012-07-23 LAB — POTASSIUM: Potassium: 4.6 mmol/L (ref 3.5–5.1)

## 2012-07-29 ENCOUNTER — Ambulatory Visit: Payer: Self-pay | Admitting: Surgery

## 2012-07-29 LAB — CBC WITH DIFFERENTIAL/PLATELET
Eosinophil #: 0.3 10*3/uL (ref 0.0–0.7)
Eosinophil %: 2.9 %
HCT: 44.4 % (ref 35.0–47.0)
HGB: 14.9 g/dL (ref 12.0–16.0)
Lymphocyte #: 2.1 10*3/uL (ref 1.0–3.6)
Lymphocyte %: 22.7 %
MCH: 30.3 pg (ref 26.0–34.0)
MCHC: 33.4 g/dL (ref 32.0–36.0)
Monocyte #: 1 x10 3/mm — ABNORMAL HIGH (ref 0.2–0.9)
Monocyte %: 10.6 %
Neutrophil #: 5.8 10*3/uL (ref 1.4–6.5)
Platelet: 182 10*3/uL (ref 150–440)
RDW: 14.5 % (ref 11.5–14.5)
WBC: 9.2 10*3/uL (ref 3.6–11.0)

## 2012-07-29 LAB — BASIC METABOLIC PANEL
Anion Gap: 3 — ABNORMAL LOW (ref 7–16)
BUN: 20 mg/dL — ABNORMAL HIGH (ref 7–18)
Chloride: 106 mmol/L (ref 98–107)
EGFR (African American): >60
Osmolality: 286 (ref 275–301)
Potassium: 4.8 mmol/L (ref 3.5–5.1)

## 2012-08-05 ENCOUNTER — Ambulatory Visit: Payer: Self-pay | Admitting: Surgery

## 2012-08-06 LAB — CBC WITH DIFFERENTIAL/PLATELET
Basophil #: 0 10*3/uL (ref 0.0–0.1)
Eosinophil #: 0.3 10*3/uL (ref 0.0–0.7)
Eosinophil %: 2.9 %
HCT: 41.2 % (ref 35.0–47.0)
HGB: 13.8 g/dL (ref 12.0–16.0)
Lymphocyte #: 1.3 10*3/uL (ref 1.0–3.6)
Lymphocyte %: 12 %
MCH: 30.4 pg (ref 26.0–34.0)
MCHC: 33.4 g/dL (ref 32.0–36.0)
MCV: 91 fL (ref 80–100)
Monocyte #: 0.7 x10 3/mm (ref 0.2–0.9)
Monocyte %: 7 %
Neutrophil #: 8.2 10*3/uL — ABNORMAL HIGH (ref 1.4–6.5)
Neutrophil %: 77.9 %
Platelet: 166 10*3/uL (ref 150–440)
WBC: 10.5 10*3/uL (ref 3.6–11.0)

## 2012-08-06 LAB — BASIC METABOLIC PANEL
EGFR (African American): >60
EGFR (Non-African Amer.): >60
Glucose: 203 mg/dL — ABNORMAL HIGH (ref 65–99)
Potassium: 4.2 mmol/L (ref 3.5–5.1)
Sodium: 133 mmol/L — ABNORMAL LOW (ref 136–145)

## 2012-08-06 LAB — PATHOLOGY REPORT

## 2012-08-15 ENCOUNTER — Ambulatory Visit: Payer: Self-pay | Admitting: Internal Medicine

## 2012-08-21 LAB — POTASSIUM: Potassium: 4.4 mmol/L (ref 3.5–5.1)

## 2012-09-15 ENCOUNTER — Ambulatory Visit: Payer: Self-pay | Admitting: Internal Medicine

## 2012-10-15 ENCOUNTER — Ambulatory Visit: Payer: Self-pay | Admitting: Internal Medicine

## 2012-11-15 ENCOUNTER — Ambulatory Visit: Payer: Self-pay | Admitting: Internal Medicine

## 2012-12-10 LAB — CREATININE, SERUM
Creatinine: 1.11 mg/dL (ref 0.60–1.30)
EGFR (African American): 59 — ABNORMAL LOW
EGFR (Non-African Amer.): 51 — ABNORMAL LOW

## 2012-12-10 LAB — MAGNESIUM: Magnesium: 2.1 mg/dL

## 2012-12-10 LAB — POTASSIUM: Potassium: 4.8 mmol/L (ref 3.5–5.1)

## 2012-12-16 ENCOUNTER — Ambulatory Visit: Payer: Self-pay | Admitting: Internal Medicine

## 2013-01-21 ENCOUNTER — Ambulatory Visit: Payer: Self-pay | Admitting: Internal Medicine

## 2013-01-21 LAB — CREATININE, SERUM
Creatinine: 1.23 mg/dL (ref 0.60–1.30)
EGFR (African American): 52 — ABNORMAL LOW
EGFR (Non-African Amer.): 45 — ABNORMAL LOW

## 2013-01-21 LAB — CBC CANCER CENTER
Basophil #: 0.1 x10 3/mm (ref 0.0–0.1)
Basophil %: 0.7 %
Eosinophil %: 3.3 %
HCT: 44.9 % (ref 35.0–47.0)
HGB: 14.7 g/dL (ref 12.0–16.0)
Lymphocyte #: 1.9 x10 3/mm (ref 1.0–3.6)
Lymphocyte %: 24.7 %
MCH: 28.6 pg (ref 26.0–34.0)
MCHC: 32.8 g/dL (ref 32.0–36.0)
MCV: 87 fL (ref 80–100)
Neutrophil #: 4.9 x10 3/mm (ref 1.4–6.5)
Neutrophil %: 61.8 %
RBC: 5.15 10*6/uL (ref 3.80–5.20)

## 2013-01-21 LAB — HEPATIC FUNCTION PANEL A (ARMC)
Albumin: 3.8 g/dL (ref 3.4–5.0)
Bilirubin,Total: 0.5 mg/dL (ref 0.2–1.0)
SGOT(AST): 36 U/L (ref 15–37)
SGPT (ALT): 62 U/L (ref 12–78)
Total Protein: 7.3 g/dL (ref 6.4–8.2)

## 2013-01-27 ENCOUNTER — Ambulatory Visit: Payer: Self-pay | Admitting: Unknown Physician Specialty

## 2013-02-15 ENCOUNTER — Ambulatory Visit: Payer: Self-pay | Admitting: Internal Medicine

## 2013-03-17 ENCOUNTER — Ambulatory Visit: Payer: Self-pay | Admitting: Internal Medicine

## 2013-04-15 LAB — POTASSIUM: Potassium: 4.2 mmol/L (ref 3.5–5.1)

## 2013-04-15 LAB — MAGNESIUM: Magnesium: 2 mg/dL

## 2013-04-17 ENCOUNTER — Ambulatory Visit: Payer: Self-pay | Admitting: Internal Medicine

## 2013-05-27 ENCOUNTER — Ambulatory Visit: Payer: Self-pay | Admitting: Internal Medicine

## 2013-05-27 LAB — CREATININE, SERUM
CREATININE: 1.18 mg/dL (ref 0.60–1.30)
EGFR (African American): 55 — ABNORMAL LOW
GFR CALC NON AF AMER: 47 — AB

## 2013-05-27 LAB — MAGNESIUM: MAGNESIUM: 2.3 mg/dL

## 2013-05-27 LAB — POTASSIUM: POTASSIUM: 4.3 mmol/L (ref 3.5–5.1)

## 2013-05-28 LAB — CLOSTRIDIUM DIFFICILE(ARMC)

## 2013-06-02 LAB — STOOL CULTURE

## 2013-06-15 ENCOUNTER — Ambulatory Visit: Payer: Self-pay | Admitting: Internal Medicine

## 2013-07-08 LAB — CBC CANCER CENTER
BASOS PCT: 0.7 %
Basophil #: 0.1 x10 3/mm (ref 0.0–0.1)
EOS ABS: 0.3 x10 3/mm (ref 0.0–0.7)
EOS PCT: 3.1 %
HCT: 44.2 % (ref 35.0–47.0)
HGB: 14.6 g/dL (ref 12.0–16.0)
Lymphocyte #: 2 x10 3/mm (ref 1.0–3.6)
Lymphocyte %: 24.8 %
MCH: 28.9 pg (ref 26.0–34.0)
MCHC: 33.1 g/dL (ref 32.0–36.0)
MCV: 87 fL (ref 80–100)
MONO ABS: 0.7 x10 3/mm (ref 0.2–0.9)
MONOS PCT: 9.1 %
NEUTROS ABS: 5.1 x10 3/mm (ref 1.4–6.5)
Neutrophil %: 62.3 %
Platelet: 187 x10 3/mm (ref 150–440)
RBC: 5.07 10*6/uL (ref 3.80–5.20)
RDW: 14.1 % (ref 11.5–14.5)
WBC: 8.1 x10 3/mm (ref 3.6–11.0)

## 2013-07-08 LAB — CREATININE, SERUM
Creatinine: 1.09 mg/dL (ref 0.60–1.30)
EGFR (African American): >60
GFR CALC NON AF AMER: 52 — AB

## 2013-07-08 LAB — POTASSIUM: Potassium: 4.3 mmol/L (ref 3.5–5.1)

## 2013-07-08 LAB — MAGNESIUM: MAGNESIUM: 1.7 mg/dL — AB

## 2013-07-09 LAB — CEA: CEA: 2.6 ng/mL (ref 0.0–4.7)

## 2013-07-16 ENCOUNTER — Ambulatory Visit: Payer: Self-pay | Admitting: Internal Medicine

## 2013-08-19 ENCOUNTER — Ambulatory Visit: Payer: Self-pay | Admitting: Internal Medicine

## 2013-08-19 ENCOUNTER — Ambulatory Visit: Payer: Self-pay

## 2013-08-19 LAB — CBC WITH DIFFERENTIAL/PLATELET
Basophil #: 0.1 10*3/uL (ref 0.0–0.1)
Basophil %: 0.8 %
Eosinophil #: 0.3 10*3/uL (ref 0.0–0.7)
Eosinophil %: 3.6 %
HCT: 43.7 % (ref 35.0–47.0)
HGB: 14.3 g/dL (ref 12.0–16.0)
LYMPHS ABS: 2.4 10*3/uL (ref 1.0–3.6)
LYMPHS PCT: 31.1 %
MCH: 28.5 pg (ref 26.0–34.0)
MCHC: 32.6 g/dL (ref 32.0–36.0)
MCV: 87 fL (ref 80–100)
Monocyte #: 0.8 x10 3/mm (ref 0.2–0.9)
Monocyte %: 9.8 %
NEUTROS ABS: 4.2 10*3/uL (ref 1.4–6.5)
NEUTROS PCT: 54.7 %
PLATELETS: 191 10*3/uL (ref 150–440)
RBC: 5 10*6/uL (ref 3.80–5.20)
RDW: 14.1 % (ref 11.5–14.5)
WBC: 7.7 10*3/uL (ref 3.6–11.0)

## 2013-08-19 LAB — LIPID PANEL
CHOLESTEROL: 135 mg/dL (ref 0–200)
HDL: 28 mg/dL — AB (ref 40–60)
Ldl Cholesterol, Calc: 74 mg/dL (ref 0–100)
Triglycerides: 167 mg/dL (ref 0–200)
VLDL CHOLESTEROL, CALC: 33 mg/dL (ref 5–40)

## 2013-08-19 LAB — COMPREHENSIVE METABOLIC PANEL
ALT: 59 U/L (ref 12–78)
AST: 39 U/L — AB (ref 15–37)
Albumin: 4 g/dL (ref 3.4–5.0)
Alkaline Phosphatase: 46 U/L
Anion Gap: 7 (ref 7–16)
BUN: 20 mg/dL — AB (ref 7–18)
Bilirubin,Total: 0.4 mg/dL (ref 0.2–1.0)
CHLORIDE: 103 mmol/L (ref 98–107)
CO2: 29 mmol/L (ref 21–32)
Calcium, Total: 9.2 mg/dL (ref 8.5–10.1)
Creatinine: 1.11 mg/dL (ref 0.60–1.30)
EGFR (African American): 59 — ABNORMAL LOW
EGFR (Non-African Amer.): 51 — ABNORMAL LOW
GLUCOSE: 143 mg/dL — AB (ref 65–99)
Osmolality: 283 (ref 275–301)
Potassium: 4.5 mmol/L (ref 3.5–5.1)
SODIUM: 139 mmol/L (ref 136–145)
TOTAL PROTEIN: 7.4 g/dL (ref 6.4–8.2)

## 2013-08-19 LAB — TSH: Thyroid Stimulating Horm: 1.79 u[IU]/mL

## 2013-09-11 ENCOUNTER — Ambulatory Visit: Payer: Self-pay

## 2013-09-15 ENCOUNTER — Ambulatory Visit: Payer: Self-pay | Admitting: Internal Medicine

## 2013-10-01 LAB — CEA: CEA: 2.4 ng/mL (ref 0.0–4.7)

## 2013-10-15 ENCOUNTER — Ambulatory Visit: Payer: Self-pay | Admitting: Internal Medicine

## 2013-11-18 ENCOUNTER — Ambulatory Visit: Payer: Self-pay | Admitting: Internal Medicine

## 2013-12-16 ENCOUNTER — Ambulatory Visit: Payer: Self-pay | Admitting: Internal Medicine

## 2013-12-23 ENCOUNTER — Ambulatory Visit: Payer: Self-pay | Admitting: Internal Medicine

## 2013-12-23 LAB — TSH: Thyroid Stimulating Horm: 2.19 u[IU]/mL

## 2013-12-23 LAB — CBC WITH DIFFERENTIAL/PLATELET
Basophil #: 0 10*3/uL (ref 0.0–0.1)
Basophil %: 0.4 %
Eosinophil #: 0.3 10*3/uL (ref 0.0–0.7)
Eosinophil %: 2.9 %
HCT: 44.4 % (ref 35.0–47.0)
HGB: 14.3 g/dL (ref 12.0–16.0)
Lymphocyte #: 2.2 10*3/uL (ref 1.0–3.6)
Lymphocyte %: 22.1 %
MCH: 29 pg (ref 26.0–34.0)
MCHC: 32.3 g/dL (ref 32.0–36.0)
MCV: 90 fL (ref 80–100)
Monocyte #: 0.8 x10 3/mm (ref 0.2–0.9)
Monocyte %: 8.7 %
Neutrophil #: 6.4 10*3/uL (ref 1.4–6.5)
Neutrophil %: 65.9 %
Platelet: 194 10*3/uL (ref 150–440)
RBC: 4.95 10*6/uL (ref 3.80–5.20)
RDW: 14.1 % (ref 11.5–14.5)
WBC: 9.8 10*3/uL (ref 3.6–11.0)

## 2013-12-23 LAB — FERRITIN: Ferritin (ARMC): 203 ng/mL (ref 8–388)

## 2013-12-23 LAB — IRON AND TIBC
IRON: 64 ug/dL (ref 50–170)
Iron Bind.Cap.(Total): 368 ug/dL (ref 250–450)
Iron Saturation: 17 %
UNBOUND IRON-BIND. CAP.: 304 ug/dL

## 2013-12-23 LAB — COMPREHENSIVE METABOLIC PANEL
Albumin: 4.2 g/dL (ref 3.4–5.0)
Alkaline Phosphatase: 43 U/L — ABNORMAL LOW
Anion Gap: 8 (ref 7–16)
BILIRUBIN TOTAL: 0.2 mg/dL (ref 0.2–1.0)
BUN: 31 mg/dL — ABNORMAL HIGH (ref 7–18)
Calcium, Total: 9.7 mg/dL (ref 8.5–10.1)
Chloride: 102 mmol/L (ref 98–107)
Co2: 27 mmol/L (ref 21–32)
Creatinine: 1.11 mg/dL (ref 0.60–1.30)
EGFR (African American): 59 — ABNORMAL LOW
GFR CALC NON AF AMER: 51 — AB
GLUCOSE: 169 mg/dL — AB (ref 65–99)
Osmolality: 284 (ref 275–301)
Potassium: 4.3 mmol/L (ref 3.5–5.1)
SGOT(AST): 57 U/L — ABNORMAL HIGH (ref 15–37)
SGPT (ALT): 67 U/L — ABNORMAL HIGH
Sodium: 137 mmol/L (ref 136–145)
TOTAL PROTEIN: 7.5 g/dL (ref 6.4–8.2)

## 2013-12-23 LAB — FOLATE: FOLIC ACID: 10 ng/mL (ref 3.1–100.0)

## 2013-12-24 LAB — CEA: CEA: 2.4 ng/mL (ref 0.0–4.7)

## 2013-12-31 LAB — CEA: CEA: 2.4 ng/mL (ref 0.0–4.7)

## 2014-01-15 ENCOUNTER — Ambulatory Visit: Payer: Self-pay | Admitting: Internal Medicine

## 2014-02-03 LAB — HEPATIC FUNCTION PANEL A (ARMC)
Albumin: 4 g/dL (ref 3.4–5.0)
Alkaline Phosphatase: 42 U/L — ABNORMAL LOW
BILIRUBIN DIRECT: 0.1 mg/dL (ref 0.0–0.2)
BILIRUBIN TOTAL: 0.3 mg/dL (ref 0.2–1.0)
SGOT(AST): 36 U/L (ref 15–37)
SGPT (ALT): 61 U/L
TOTAL PROTEIN: 7.6 g/dL (ref 6.4–8.2)

## 2014-02-03 LAB — CREATININE, SERUM
Creatinine: 1.23 mg/dL (ref 0.60–1.30)
GFR CALC AF AMER: 56 — AB
GFR CALC NON AF AMER: 46 — AB

## 2014-02-03 LAB — MAGNESIUM: MAGNESIUM: 2.1 mg/dL

## 2014-02-03 LAB — IRON AND TIBC
IRON SATURATION: 18 %
Iron Bind.Cap.(Total): 373 ug/dL (ref 250–450)
Iron: 66 ug/dL (ref 50–170)
Unbound Iron-Bind.Cap.: 307 ug/dL

## 2014-02-03 LAB — POTASSIUM: POTASSIUM: 4.3 mmol/L (ref 3.5–5.1)

## 2014-02-05 ENCOUNTER — Ambulatory Visit: Payer: Self-pay | Admitting: Unknown Physician Specialty

## 2014-02-05 LAB — CLOSTRIDIUM DIFFICILE(ARMC)

## 2014-02-08 LAB — STOOL CULTURE

## 2014-02-15 ENCOUNTER — Ambulatory Visit: Payer: Self-pay | Admitting: Internal Medicine

## 2014-02-27 ENCOUNTER — Ambulatory Visit: Payer: Self-pay | Admitting: Unknown Physician Specialty

## 2014-02-27 LAB — CBC WITH DIFFERENTIAL/PLATELET
BASOS PCT: 0.6 %
Basophil #: 0.1 10*3/uL (ref 0.0–0.1)
EOS ABS: 0.2 10*3/uL (ref 0.0–0.7)
Eosinophil %: 3 %
HCT: 46.3 % (ref 35.0–47.0)
HGB: 14.8 g/dL (ref 12.0–16.0)
LYMPHS ABS: 2.3 10*3/uL (ref 1.0–3.6)
LYMPHS PCT: 27.7 %
MCH: 28.5 pg (ref 26.0–34.0)
MCHC: 32.1 g/dL (ref 32.0–36.0)
MCV: 89 fL (ref 80–100)
Monocyte #: 0.7 x10 3/mm (ref 0.2–0.9)
Monocyte %: 9.2 %
NEUTROS ABS: 4.8 10*3/uL (ref 1.4–6.5)
Neutrophil %: 59.5 %
PLATELETS: 205 10*3/uL (ref 150–440)
RBC: 5.2 10*6/uL (ref 3.80–5.20)
RDW: 14.1 % (ref 11.5–14.5)
WBC: 8.1 10*3/uL (ref 3.6–11.0)

## 2014-03-03 LAB — CEA: CEA: 2.6 ng/mL (ref 0.0–4.7)

## 2014-03-17 ENCOUNTER — Ambulatory Visit: Payer: Self-pay | Admitting: Internal Medicine

## 2014-03-18 LAB — CEA: CEA: 2.6 ng/mL (ref 0.0–4.7)

## 2014-04-17 ENCOUNTER — Ambulatory Visit: Payer: Self-pay | Admitting: Internal Medicine

## 2014-04-28 DIAGNOSIS — C180 Malignant neoplasm of cecum: Secondary | ICD-10-CM | POA: Diagnosis not present

## 2014-04-28 DIAGNOSIS — R42 Dizziness and giddiness: Secondary | ICD-10-CM | POA: Diagnosis not present

## 2014-04-28 DIAGNOSIS — G629 Polyneuropathy, unspecified: Secondary | ICD-10-CM | POA: Diagnosis not present

## 2014-04-28 DIAGNOSIS — E119 Type 2 diabetes mellitus without complications: Secondary | ICD-10-CM | POA: Diagnosis not present

## 2014-04-28 DIAGNOSIS — I10 Essential (primary) hypertension: Secondary | ICD-10-CM | POA: Diagnosis not present

## 2014-04-28 DIAGNOSIS — R918 Other nonspecific abnormal finding of lung field: Secondary | ICD-10-CM | POA: Diagnosis not present

## 2014-04-28 DIAGNOSIS — M79605 Pain in left leg: Secondary | ICD-10-CM | POA: Diagnosis not present

## 2014-04-28 DIAGNOSIS — E785 Hyperlipidemia, unspecified: Secondary | ICD-10-CM | POA: Diagnosis not present

## 2014-04-28 DIAGNOSIS — G8929 Other chronic pain: Secondary | ICD-10-CM | POA: Diagnosis not present

## 2014-04-28 LAB — CREATININE, SERUM
Creatinine: 1.2 mg/dL (ref 0.60–1.30)
EGFR (Non-African Amer.): 47 — ABNORMAL LOW
GFR CALC AF AMER: 57 — AB

## 2014-04-30 DIAGNOSIS — G8929 Other chronic pain: Secondary | ICD-10-CM | POA: Diagnosis not present

## 2014-04-30 DIAGNOSIS — E785 Hyperlipidemia, unspecified: Secondary | ICD-10-CM | POA: Diagnosis not present

## 2014-04-30 DIAGNOSIS — C180 Malignant neoplasm of cecum: Secondary | ICD-10-CM | POA: Diagnosis not present

## 2014-04-30 DIAGNOSIS — I10 Essential (primary) hypertension: Secondary | ICD-10-CM | POA: Diagnosis not present

## 2014-04-30 DIAGNOSIS — E119 Type 2 diabetes mellitus without complications: Secondary | ICD-10-CM | POA: Diagnosis not present

## 2014-04-30 DIAGNOSIS — G629 Polyneuropathy, unspecified: Secondary | ICD-10-CM | POA: Diagnosis not present

## 2014-04-30 DIAGNOSIS — M79605 Pain in left leg: Secondary | ICD-10-CM | POA: Diagnosis not present

## 2014-04-30 DIAGNOSIS — R42 Dizziness and giddiness: Secondary | ICD-10-CM | POA: Diagnosis not present

## 2014-04-30 DIAGNOSIS — R918 Other nonspecific abnormal finding of lung field: Secondary | ICD-10-CM | POA: Diagnosis not present

## 2014-05-05 DIAGNOSIS — R2689 Other abnormalities of gait and mobility: Secondary | ICD-10-CM | POA: Diagnosis not present

## 2014-05-05 DIAGNOSIS — S060X0A Concussion without loss of consciousness, initial encounter: Secondary | ICD-10-CM | POA: Diagnosis not present

## 2014-05-12 DIAGNOSIS — E782 Mixed hyperlipidemia: Secondary | ICD-10-CM | POA: Diagnosis not present

## 2014-05-12 DIAGNOSIS — Z8500 Personal history of malignant neoplasm of unspecified digestive organ: Secondary | ICD-10-CM | POA: Diagnosis not present

## 2014-05-12 DIAGNOSIS — K5900 Constipation, unspecified: Secondary | ICD-10-CM | POA: Diagnosis not present

## 2014-05-12 DIAGNOSIS — I10 Essential (primary) hypertension: Secondary | ICD-10-CM | POA: Diagnosis not present

## 2014-05-12 DIAGNOSIS — R42 Dizziness and giddiness: Secondary | ICD-10-CM | POA: Diagnosis not present

## 2014-05-12 DIAGNOSIS — E119 Type 2 diabetes mellitus without complications: Secondary | ICD-10-CM | POA: Diagnosis not present

## 2014-05-18 ENCOUNTER — Ambulatory Visit: Payer: Self-pay | Admitting: Internal Medicine

## 2014-05-20 ENCOUNTER — Encounter: Payer: Self-pay | Admitting: Nurse Practitioner

## 2014-05-20 DIAGNOSIS — M6281 Muscle weakness (generalized): Secondary | ICD-10-CM | POA: Diagnosis not present

## 2014-05-20 DIAGNOSIS — R2689 Other abnormalities of gait and mobility: Secondary | ICD-10-CM | POA: Diagnosis not present

## 2014-05-20 DIAGNOSIS — R269 Unspecified abnormalities of gait and mobility: Secondary | ICD-10-CM | POA: Diagnosis not present

## 2014-05-25 DIAGNOSIS — R269 Unspecified abnormalities of gait and mobility: Secondary | ICD-10-CM | POA: Diagnosis not present

## 2014-05-25 DIAGNOSIS — M6281 Muscle weakness (generalized): Secondary | ICD-10-CM | POA: Diagnosis not present

## 2014-05-25 DIAGNOSIS — R2689 Other abnormalities of gait and mobility: Secondary | ICD-10-CM | POA: Diagnosis not present

## 2014-05-28 DIAGNOSIS — M6281 Muscle weakness (generalized): Secondary | ICD-10-CM | POA: Diagnosis not present

## 2014-05-28 DIAGNOSIS — R2689 Other abnormalities of gait and mobility: Secondary | ICD-10-CM | POA: Diagnosis not present

## 2014-05-28 DIAGNOSIS — R269 Unspecified abnormalities of gait and mobility: Secondary | ICD-10-CM | POA: Diagnosis not present

## 2014-06-02 DIAGNOSIS — R269 Unspecified abnormalities of gait and mobility: Secondary | ICD-10-CM | POA: Diagnosis not present

## 2014-06-02 DIAGNOSIS — R2689 Other abnormalities of gait and mobility: Secondary | ICD-10-CM | POA: Diagnosis not present

## 2014-06-02 DIAGNOSIS — M6281 Muscle weakness (generalized): Secondary | ICD-10-CM | POA: Diagnosis not present

## 2014-06-04 DIAGNOSIS — M6281 Muscle weakness (generalized): Secondary | ICD-10-CM | POA: Diagnosis not present

## 2014-06-04 DIAGNOSIS — R2689 Other abnormalities of gait and mobility: Secondary | ICD-10-CM | POA: Diagnosis not present

## 2014-06-04 DIAGNOSIS — R269 Unspecified abnormalities of gait and mobility: Secondary | ICD-10-CM | POA: Diagnosis not present

## 2014-06-08 DIAGNOSIS — R269 Unspecified abnormalities of gait and mobility: Secondary | ICD-10-CM | POA: Diagnosis not present

## 2014-06-08 DIAGNOSIS — M6281 Muscle weakness (generalized): Secondary | ICD-10-CM | POA: Diagnosis not present

## 2014-06-08 DIAGNOSIS — R2689 Other abnormalities of gait and mobility: Secondary | ICD-10-CM | POA: Diagnosis not present

## 2014-06-09 ENCOUNTER — Ambulatory Visit: Payer: Self-pay | Admitting: Nurse Practitioner

## 2014-06-09 DIAGNOSIS — E785 Hyperlipidemia, unspecified: Secondary | ICD-10-CM | POA: Diagnosis not present

## 2014-06-09 DIAGNOSIS — R918 Other nonspecific abnormal finding of lung field: Secondary | ICD-10-CM | POA: Diagnosis not present

## 2014-06-09 DIAGNOSIS — R42 Dizziness and giddiness: Secondary | ICD-10-CM | POA: Diagnosis not present

## 2014-06-09 DIAGNOSIS — G629 Polyneuropathy, unspecified: Secondary | ICD-10-CM | POA: Diagnosis not present

## 2014-06-09 DIAGNOSIS — E119 Type 2 diabetes mellitus without complications: Secondary | ICD-10-CM | POA: Diagnosis not present

## 2014-06-09 DIAGNOSIS — M79605 Pain in left leg: Secondary | ICD-10-CM | POA: Diagnosis not present

## 2014-06-09 DIAGNOSIS — G8929 Other chronic pain: Secondary | ICD-10-CM | POA: Diagnosis not present

## 2014-06-09 DIAGNOSIS — I10 Essential (primary) hypertension: Secondary | ICD-10-CM | POA: Diagnosis not present

## 2014-06-09 DIAGNOSIS — C180 Malignant neoplasm of cecum: Secondary | ICD-10-CM | POA: Diagnosis not present

## 2014-06-10 DIAGNOSIS — M6281 Muscle weakness (generalized): Secondary | ICD-10-CM | POA: Diagnosis not present

## 2014-06-10 DIAGNOSIS — R269 Unspecified abnormalities of gait and mobility: Secondary | ICD-10-CM | POA: Diagnosis not present

## 2014-06-10 DIAGNOSIS — R2689 Other abnormalities of gait and mobility: Secondary | ICD-10-CM | POA: Diagnosis not present

## 2014-06-15 DIAGNOSIS — R2689 Other abnormalities of gait and mobility: Secondary | ICD-10-CM | POA: Diagnosis not present

## 2014-06-15 DIAGNOSIS — R269 Unspecified abnormalities of gait and mobility: Secondary | ICD-10-CM | POA: Diagnosis not present

## 2014-06-15 DIAGNOSIS — M6281 Muscle weakness (generalized): Secondary | ICD-10-CM | POA: Diagnosis not present

## 2014-06-16 ENCOUNTER — Encounter
Admit: 2014-06-16 | Disposition: A | Discharge status: Home / Self Care | Payer: Self-pay | Attending: Nurse Practitioner | Admitting: Nurse Practitioner

## 2014-06-16 ENCOUNTER — Ambulatory Visit
Admit: 2014-06-16 | Disposition: A | Discharge status: Home / Self Care | Payer: Self-pay | Attending: Internal Medicine | Admitting: Internal Medicine

## 2014-06-16 DIAGNOSIS — R918 Other nonspecific abnormal finding of lung field: Secondary | ICD-10-CM | POA: Diagnosis not present

## 2014-06-16 DIAGNOSIS — C180 Malignant neoplasm of cecum: Secondary | ICD-10-CM | POA: Diagnosis not present

## 2014-06-17 DIAGNOSIS — R269 Unspecified abnormalities of gait and mobility: Secondary | ICD-10-CM | POA: Diagnosis not present

## 2014-06-17 DIAGNOSIS — M6281 Muscle weakness (generalized): Secondary | ICD-10-CM | POA: Diagnosis not present

## 2014-06-23 DIAGNOSIS — M6281 Muscle weakness (generalized): Secondary | ICD-10-CM | POA: Diagnosis not present

## 2014-06-23 DIAGNOSIS — R269 Unspecified abnormalities of gait and mobility: Secondary | ICD-10-CM | POA: Diagnosis not present

## 2014-06-29 DIAGNOSIS — R269 Unspecified abnormalities of gait and mobility: Secondary | ICD-10-CM | POA: Diagnosis not present

## 2014-06-29 DIAGNOSIS — M6281 Muscle weakness (generalized): Secondary | ICD-10-CM | POA: Diagnosis not present

## 2014-07-01 DIAGNOSIS — E782 Mixed hyperlipidemia: Secondary | ICD-10-CM | POA: Diagnosis not present

## 2014-07-01 DIAGNOSIS — N390 Urinary tract infection, site not specified: Secondary | ICD-10-CM | POA: Diagnosis not present

## 2014-07-01 DIAGNOSIS — I10 Essential (primary) hypertension: Secondary | ICD-10-CM | POA: Diagnosis not present

## 2014-07-01 DIAGNOSIS — E1165 Type 2 diabetes mellitus with hyperglycemia: Secondary | ICD-10-CM | POA: Diagnosis not present

## 2014-07-01 DIAGNOSIS — C188 Malignant neoplasm of overlapping sites of colon: Secondary | ICD-10-CM | POA: Diagnosis not present

## 2014-07-06 DIAGNOSIS — M6281 Muscle weakness (generalized): Secondary | ICD-10-CM | POA: Diagnosis not present

## 2014-07-06 DIAGNOSIS — R269 Unspecified abnormalities of gait and mobility: Secondary | ICD-10-CM | POA: Diagnosis not present

## 2014-07-08 DIAGNOSIS — C180 Malignant neoplasm of cecum: Secondary | ICD-10-CM | POA: Diagnosis not present

## 2014-07-08 DIAGNOSIS — M6281 Muscle weakness (generalized): Secondary | ICD-10-CM | POA: Diagnosis not present

## 2014-07-08 DIAGNOSIS — R918 Other nonspecific abnormal finding of lung field: Secondary | ICD-10-CM | POA: Diagnosis not present

## 2014-07-08 DIAGNOSIS — K760 Fatty (change of) liver, not elsewhere classified: Secondary | ICD-10-CM | POA: Diagnosis not present

## 2014-07-08 DIAGNOSIS — C189 Malignant neoplasm of colon, unspecified: Secondary | ICD-10-CM | POA: Diagnosis not present

## 2014-07-08 DIAGNOSIS — R269 Unspecified abnormalities of gait and mobility: Secondary | ICD-10-CM | POA: Diagnosis not present

## 2014-07-13 DIAGNOSIS — M6281 Muscle weakness (generalized): Secondary | ICD-10-CM | POA: Diagnosis not present

## 2014-07-13 DIAGNOSIS — R269 Unspecified abnormalities of gait and mobility: Secondary | ICD-10-CM | POA: Diagnosis not present

## 2014-07-17 ENCOUNTER — Encounter
Admit: 2014-07-17 | Disposition: A | Discharge status: Home / Self Care | Payer: Self-pay | Attending: Nurse Practitioner | Admitting: Nurse Practitioner

## 2014-07-17 ENCOUNTER — Ambulatory Visit
Admit: 2014-07-17 | Disposition: A | Discharge status: Home / Self Care | Payer: Self-pay | Attending: Internal Medicine | Admitting: Internal Medicine

## 2014-07-20 DIAGNOSIS — R269 Unspecified abnormalities of gait and mobility: Secondary | ICD-10-CM | POA: Diagnosis not present

## 2014-07-20 DIAGNOSIS — M6281 Muscle weakness (generalized): Secondary | ICD-10-CM | POA: Diagnosis not present

## 2014-07-21 DIAGNOSIS — R197 Diarrhea, unspecified: Secondary | ICD-10-CM | POA: Diagnosis not present

## 2014-07-21 DIAGNOSIS — R918 Other nonspecific abnormal finding of lung field: Secondary | ICD-10-CM | POA: Diagnosis not present

## 2014-07-21 DIAGNOSIS — I10 Essential (primary) hypertension: Secondary | ICD-10-CM | POA: Diagnosis not present

## 2014-07-21 DIAGNOSIS — Z79899 Other long term (current) drug therapy: Secondary | ICD-10-CM | POA: Diagnosis not present

## 2014-07-21 DIAGNOSIS — C180 Malignant neoplasm of cecum: Secondary | ICD-10-CM | POA: Diagnosis not present

## 2014-07-21 DIAGNOSIS — E785 Hyperlipidemia, unspecified: Secondary | ICD-10-CM | POA: Diagnosis not present

## 2014-07-21 DIAGNOSIS — R42 Dizziness and giddiness: Secondary | ICD-10-CM | POA: Diagnosis not present

## 2014-07-21 DIAGNOSIS — E119 Type 2 diabetes mellitus without complications: Secondary | ICD-10-CM | POA: Diagnosis not present

## 2014-07-21 DIAGNOSIS — G629 Polyneuropathy, unspecified: Secondary | ICD-10-CM | POA: Diagnosis not present

## 2014-07-21 LAB — CBC CANCER CENTER
BASOS ABS: 0.1 x10 3/mm (ref 0.0–0.1)
Basophil %: 0.7 %
EOS PCT: 2.7 %
Eosinophil #: 0.3 x10 3/mm (ref 0.0–0.7)
HCT: 43.8 % (ref 35.0–47.0)
HGB: 14.6 g/dL (ref 12.0–16.0)
Lymphocyte #: 2.5 x10 3/mm (ref 1.0–3.6)
Lymphocyte %: 26.6 %
MCH: 28.8 pg (ref 26.0–34.0)
MCHC: 33.3 g/dL (ref 32.0–36.0)
MCV: 87 fL (ref 80–100)
MONO ABS: 0.8 x10 3/mm (ref 0.2–0.9)
Monocyte %: 8.6 %
NEUTROS PCT: 61.4 %
Neutrophil #: 5.8 x10 3/mm (ref 1.4–6.5)
PLATELETS: 181 x10 3/mm (ref 150–440)
RBC: 5.06 10*6/uL (ref 3.80–5.20)
RDW: 13.9 % (ref 11.5–14.5)
WBC: 9.5 x10 3/mm (ref 3.6–11.0)

## 2014-07-21 LAB — HEPATIC FUNCTION PANEL A (ARMC)
Albumin: 4.3 g/dL
Alkaline Phosphatase: 37 U/L — ABNORMAL LOW
BILIRUBIN TOTAL: 0.2 mg/dL — AB
SGOT(AST): 31 U/L
SGPT (ALT): 43 U/L
TOTAL PROTEIN: 7.2 g/dL

## 2014-07-22 DIAGNOSIS — R269 Unspecified abnormalities of gait and mobility: Secondary | ICD-10-CM | POA: Diagnosis not present

## 2014-07-22 DIAGNOSIS — M6281 Muscle weakness (generalized): Secondary | ICD-10-CM | POA: Diagnosis not present

## 2014-07-27 DIAGNOSIS — E1165 Type 2 diabetes mellitus with hyperglycemia: Secondary | ICD-10-CM | POA: Diagnosis not present

## 2014-07-27 DIAGNOSIS — I10 Essential (primary) hypertension: Secondary | ICD-10-CM | POA: Diagnosis not present

## 2014-07-27 DIAGNOSIS — F329 Major depressive disorder, single episode, unspecified: Secondary | ICD-10-CM | POA: Diagnosis not present

## 2014-08-04 NOTE — Op Note (Signed)
PATIENT NAME:  Tiffany Hawkins, Tiffany Hawkins MR#:  [id number] DATE OF BIRTH:  1945/07/12  DATE OF PROCEDURE:  12/08/2011  PREOPERATIVE DIAGNOSIS: Colon carcinoma.   POSTOPERATIVE DIAGNOSIS: Colon carcinoma.  PROCEDURE: Port placement.   SURGEON: Rodena Goldmann III, MD  ANESTHESIA: Monitored anesthetic care.  DESCRIPTION OF PROCEDURE: With the patient in the supine position after induction of appropriate intravenous sedation, the patient's right neck and chest were prepped with Betadine, draped with sterile towels. 1% Xylocaine buffered with sodium bicarbonate was injected under the clavicle and this area was incised without difficulty. Patient placed in slight Trendelenburg position. Subclavian vein was cannulated on a single pass but a wire could not be passed into the great vessel system even with fluoroscopic control. Two attempts were made without success. Attention was then turned to the right neck where patient's neck was interrogated with the ultrasound. The internal jugular vein was easily identified but appeared to be variable in size with her respirations. Under direct vision the vein was cannulated and a wire passed into the great vessel system under fluoroscopic control. A second incision was made on the chest wall to receive the Port-A-Cath device. The skin was anesthetized with 1% Xylocaine. This area is incised without difficulty. A subcutaneous suprafascial pocket was created for the Port-A-Cath device. The introducer was inserted over the wire and the dilator and wire removed. Heparin filled catheter was inserted through the introducer, appropriately positioned with Conray. It was positioned in the great vessel system. It was then tunneled to the second incision where it was tied to the Port-A-Cath device without difficulty. The Port-A-Cath device was then filled with Conray to confirm the absence of kinks or leaks and the appropriate position in the great vessel system. It was then flushed with  heparinized saline, sutured to the chest wall. 3-0 silk was utilized for that procedure. Subcutaneous space was obliterated with 3-0 Vicryl and all three incisions were closed with 4-0 nylon. Sterile dressings were applied. The patient returned to the recovery room having tolerated procedure well.     Sponge, instrument, needle count were correct x2 in the Operating Room.   ____________________________ Rodena Goldmann III, MD rle:cms D: 12/08/2011 11:05:40 ET T: 12/08/2011 11:12:00 ET JOB#: [id number]  cc: Rodena Goldmann III, MD, <Dictator> Rodena Goldmann MD ELECTRONICALLY SIGNED 12/10/2011 7:05

## 2014-08-04 NOTE — Discharge Summary (Signed)
PATIENT NAME:  Tiffany Hawkins, Tiffany Hawkins MR#:  [id number] DATE OF BIRTH:  05-13-1945  DATE OF ADMISSION:  10/24/2011 DATE OF DISCHARGE:  10/29/2011  BRIEF HISTORY: [name] is a 69 year old woman with recently identified right colon cancer.  She underwent colonoscopy for routine screening and was found to have a small, less than 2-cm, invasive cancer in her cecum. She was referred for elective evaluation and set up for laparoscopically assisted right colectomy. She was admitted to the hospital after appropriate preoperative preparation and informed consent and taken to surgery on the morning of October 24, 2011. The procedure was uncomplicated. She had no significant intraoperative problems. She was followed by the medical service while hospitalized, covering her multiple medical problems. She had slow return of bowel function. The pathology returned a T4 N0 lesion was 0/18 lymph nodes identified with malignancy. She was discharged home on the fourteenth to be followed in the office in 7 to 10 days' time. Bathing, activity, and driving instructions were given to the patient.   DISCHARGE MEDICATIONS:  1. Bisoprolol/ hydrochlorothiazide 5/6.25 mg once a day.  2. Celexa 20 mg p.o. once a day.  3. Glimepiride 4 mg, 2 tablets twice a day. 4. Januvia 100 mg once a day. 5. Lovastatin 20 mg b.i.d.  6. Omeprazole 20 mg b.i.d.  7. Gabapentin 300 mg at bedtime. 8. Fenofibrate 54 mg once a day at bedtime.  Discharge medications also include: 9. Reglan 10 mg p.o. q.i.d.  10. Oxycodone 5 mg p.o. q. 6 hours p.r.n. pain.   FINAL DISCHARGE DIAGNOSIS: Right colon carcinoma.       SURGERY:  Laparoscopically-assisted right colectomy.    ____________________________ Rodena Goldmann III, MD rle:bjt D: 11/03/2011 20:51:07 ET T: 11/06/2011 10:20:56 ET JOB#: [id number]  cc: Micheline Maze, MD, <Dictator> Manya Silvas, MD Lavera Guise, MD Rodena Goldmann MD ELECTRONICALLY SIGNED 11/07/2011 22:43

## 2014-08-07 NOTE — Discharge Summary (Signed)
PATIENT NAME:  Tiffany Hawkins, Tiffany Hawkins MR#:  [id number] DATE OF BIRTH:  [birth year]  DATE OF ADMISSION:  08/05/2012 DATE OF DISCHARGE:  08/09/2012  BRIEF HISTORY OF PRESENT ILLNESS:  The patient is a 68 year old woman with a large midline ventral hernia following a laparoscopic right colectomy.  She is becoming increasingly symptomatic and would like to have the hernia repaired.  After appropriate preoperative preparation and informed consent, she was taken to surgery on the morning of 08/05/2012.  A ventral hernia repair was performed without difficulty. synthetic mesh was utilized for the repair and a component separation performed in order to cover the mesh.  Postoperatively she had some significant pain control issues, mild nausea.  She had some confusion from her narcotics.  She had very slow return of bowel function and difficulty ambulating until today.  She was able to tolerate a liquid diet yesterday and advanced to a soft diet overnight.  Her wounds look good.  There is minimal JP drainage at the present time.  She was discharged with her drains in place.   DISCHARGE MEDICATIONS:  Include Bisoprolol hydrochlorothiazide 5 mg/6.25 mg once a day, gabapentin 300 mg by mouth at bedtime, fenofibrate 54 mg at bedtime, citalopram 20 mg by mouth twice daily, glimepiride 4 mg twice daily, lovastatin 20 mg by mouth at bedtime, magnesium oxide 400 mg twice daily, omeprazole 20 mg twice daily and potassium chloride 20 mEq twice daily.   FINAL DISCHARGE DIAGNOSIS:  Ventral hernia.   SURGERY:  Ventral hernia repair.    ____________________________ Micheline Maze, MD rle:ea D: 08/09/2012 16:26:43 ET T: 08/10/2012 05:01:23 ET JOB#: [id number]  cc: Micheline Maze, MD, <Dictator> Lavera Guise, MD Rodena Goldmann MD ELECTRONICALLY SIGNED 08/16/2012 16:27

## 2014-08-07 NOTE — Op Note (Signed)
PATIENT NAME:  Tiffany Hawkins, Tiffany Hawkins MR#:  [id number] DATE OF BIRTH:  [birth year]  DATE OF PROCEDURE:  08/05/2012  PREOPERATIVE DIAGNOSIS: Ventral hernia.  POSTOPERATIVE DIAGNOSIS: Ventral hernia.  OPERATION: Ventral hernia repair.  ANESTHESIA: General.  SURGEON: Dr. Pat Patrick.  OPERATIVE PROCEDURE: With the patient in the supine position and after induction of appropriate general anesthesia, the patient's abdomen was prepped with ChloraPrep and draped with sterile towels. An elliptical incision was made around the previous scar, which was removed. This incision was carried down through the subcutaneous tissue with Bovie electrocautery. The hernia sac was identified and dissected back to its base in the abdominal wall. There did appear to be multiple small hernia sacs and one large hernia sac. All of the defects were incorporated into a single defect and dissection carried out all the way to the lateral edge of the rectus sheath. A muscle-relaxing incision was made in that area to allow for better loop and out the anterior abdominal wall. The a piece of Physiomesh was brought to the table, appropriately fashioned, inserted into the defect, overlapped by several centimeters on each side. It was placed in appropriate position with 0 Prolene mattress using vertical mattress sutures. Once satisfactory position was obtained and the mesh was secured, the fascia was closed over it using 0 Prolene interrupted figure-of-eight sutures. The area was then irrigated. Then 19-French Blake drains were placed, one on each side into the dead space, and the umbilicus reapproximated with 0 Vicryl. The skin was clipped and a compressive sterile dressing was applied with the abdominal binder. The patient was returned to the recovery room, having tolerated the procedure well.   Sponge, instrument, and needle counts were correct x2 at the end of the operation.   ____________________________ Rodena Goldmann III,  MD rle:dm D: 08/05/2012 09:58:47 ET T: 08/05/2012 10:11:23 ET JOB#: [id number]  cc: Micheline Maze, MD, <Dictator> Lavera Guise, MD Rodena Goldmann MD ELECTRONICALLY SIGNED 08/06/2012 7:49

## 2014-08-09 NOTE — Op Note (Signed)
PATIENT NAME:  Tiffany Hawkins, Tiffany Hawkins MR#:  [id number] DATE OF BIRTH:  1946-01-19  DATE OF PROCEDURE:  10/24/2011  PREOPERATIVE DIAGNOSIS: Right colon carcinoma.   POSTOPERATIVE DIAGNOSIS: Right colon carcinoma.   PROCEDURE: Right laparoscopic-assisted ascending colectomy.   SURGEON: Rodena Goldmann, III, MD   ASSISTANT: PA Student   ANESTHESIA: General.   OPERATIVE PROCEDURE: With the patient in the supine position after the induction of appropriate general anesthesia and placement of a Foley catheter, the patient's abdomen was prepped with Betadine and draped with sterile towels. An alcohol wipe and Betadine-impregnated Steri-Drape were utilized. A small infraumbilical incision was made in the standard fashion, carried down bluntly through the subcutaneous tissue. The Veress needle was used to cannulate the peritoneal cavity. CO2 was insufflated to appropriate pressure measurements. When approximately 2.5 liters of CO2 were instilled, the Veress needle was withdrawn. An 11 mm [employer] Port was inserted into the peritoneal cavity. Preoperative position was confirmed, and CO2 was reinsufflated. A left upper quadrant transverse incision was made and an 11 mm port inserted under direct vision. A right lower quadrant transverse incision was made, and an 11 mm port was inserted under direct vision. The patient was rotated slightly to the left side. The cecum was identified without difficulty. There were some adhesions to the liver and the pelvic side wall from her previous cholecystectomy and appendectomy. The bowel was dissected using LigaSure apparatus from the terminal ileum to the mid transverse colon. Once satisfactory exposure was obtained, abdomen was desufflated and the midline incision extended above the umbilicus and carried down through the subcutaneous tissue with Bovie electrocautery. The midline fascia was identified and opened the length of the skin incision as was the peritoneum. The right  colon was mobilized into the incision without difficulty and the bowel examined from the terminal ileum to the mid transverse colon. An area was chosen on the mid transverse colon and terminal ileum for the resection, and both loops were divided with a single application of each of a GIA stapling device carrying a 75 mm load. The mesentery was taken down with LigaSure apparatus. The ileocolic arteries were individually doubly clipped and divided. The specimen was passed off the table and opened. There was a clear-cut tumor in the cecum and several small polyps on the ascending colon. Two loops of bowel were placed side by side.  A small enterotomy was made in each loop of bowel. A limb of the GIA stapling device was inserted into a loop of bowel, approximated and fired. The anastomosis was examined and no significant bleeding was encountered. The enterotomy was closed with a single application of the FW-26 device carrying a blue load. The mesenteric defect was closed with a running suture of 3-0 Vicryl, and the omentum was sutured over the anastomosis and the mesenteric defect using 3-0 silk. Bowel contents were returned to their anatomic position. The midline was then closed using running sutures of 0 Prolene from each end and tied in the middle. The abdomen was then reinsufflated and the area evaluated. The site of anastomosis, right colon, and bed of liver were irrigated with a liter of warm saline solution and suctioned clean. All ports were withdrawn without difficulty. Skin incisions were clipped. Sterile dressings were applied. The patient was returned to the recovery room having tolerated the procedure well. Sponge,  instrument, and needle counts were correct x2 in the operating room.    ____________________________ Rodena Goldmann III, MD rle:cbb D: 10/24/2011 13:09:23 ET T:  10/24/2011 13:23:03 ET JOB#: [id number]  cc: Micheline Maze, MD, <Dictator> Lavera Guise, MD Manya Silvas, MD Rodena Goldmann  MD ELECTRONICALLY SIGNED 10/24/2011 16:01

## 2014-08-09 NOTE — Consult Note (Signed)
Brief Consult Note: Patient was seen by consultant.   Consult note dictated.   Orders entered.   Comments: 69 yr old pt with HTN, DM, Recent diagnosis of colon ca s/p colectomy and doing well.  1. s/p colectomy for colon ca  2. HTN- add Hydralazine PRn. Well controlled.  3. DM-2- NPO. SSi with accuchecks q6 hrs.  4. DVT Px - Lovenox when ok per surgery. On TEDs.  Thank you for the consult. Will follow patient.  Electronic Signatures: Alba Destine (MD)  (Signed 10-Jul-13 14:31)  Authored: Brief Consult Note   Last Updated: 10-Jul-13 14:31 by Alba Destine (MD)

## 2014-08-09 NOTE — Consult Note (Signed)
PATIENT NAME:  Tiffany Hawkins, Tiffany Hawkins MR#:  [id number] DATE OF BIRTH:  1945/05/14  DATE OF CONSULTATION:  10/25/2011  REFERRING PHYSICIAN:  Dr. Pat Patrick CONSULTING PHYSICIAN:  Teanna Elem R. Aloura Matsuoka, MD  PRIMARY CARE PHYSICIAN:  Dr. Clayborn Bigness   REASON FOR CONSULTATION: Hypertension, diabetes management in a patient status post colectomy.   HISTORY OF PRESENTING ILLNESS: 69 year old Caucasian female patient with history of hypertension, diabetes, recent diagnosis of colon cancer, admitted to the hospital status post colectomy and is n.p.o. The patient complains of significant pain in her abdomen. No nausea or vomiting. No shortness of breath or chest pain. No rash, fever, or chills. She mentions that her blood pressure and diabetes are well controlled at home.  She is on Januvia and  glimepiride for her diabetes. She has never been on insulin. Takes bisoprolol and hydrochlorothiazide for hypertension. She is on a PCA pump for morphine here in the hospital for pain. She does complain of chronic dizziness, which has been extensively worked up with neurology and cardiology as outpatient.   PAST MEDICAL HISTORY:  Hypertension, diabetes, chronic dizziness, hyperlipidemia.   PAST SURGICAL: HISTORY:  Appendectomy and present colectomy.   ALLERGIES: No known drug allergies.   SOCIAL HISTORY:  The patient lives with her husband. Does not smoke. No alcohol. No illicit drugs.  CODE STATUS: FULL CODE.  REVIEW OF SYSTEMS:  Please see history of presenting illness.  The rest of the systems reviewed and negative.  HOME MEDICATIONS:  1. Bisoprolol/hydrochlorothiazide 5/625 mg orally twice a day.  2. Celexa 20 mg orally once a day.  3. Fenofibrate 54 mg orally once a day.  4. Gabapentin 300 mg orally once a day.  5. Glimepiride 4 mg orally 2 times a day. 6. Januvia 100 mg 1 tablet orally once a day.  7. Lovastatin 20 mg orally 2 times a day.  8. Omeprazole 20 mg orally once a day.   FAMILY HISTORY: Reviewed and  unknown.   PHYSICAL EXAMINATION: VITAL SIGNS: Temperature 97.6, pulse 84, respirations 20, blood pressure 102/69, saturating 93% on room air.   GENERAL: Obese Caucasian female patient lying in bed, comfortable conversation, cooperative with exam.  PSYCH:  Alert and oriented times three.  Mood and affect appropriate. Judgment intact.  HEENT: Atraumatic, normocephalic. Oral mucosa moist and pink. External ears and nose normal.  No pallor, no icterus. Pupils bilaterally equal and reactive to light.   NECK: Neck supple. No thyromegaly. No palpable lymph nodes. Trachea midline. No carotid bruit or  JVD.   CARDIOVASCULAR:  S1, S2, regular rate and rhythm without any murmurs. Peripheral pulses 2+.  RESPIRATORY:  Normal work of breathing. Clear to auscultation on both sides. Normal percussion note.  GI: Tender, distended abdomen with dressing over the surgical wounds. Bowel sounds absent. No hepatosplenomegaly palpable.   GU:  No CVA tenderness or bladder distention. No suprapubic tenderness.   SKIN: Warm and dry. No petechiae, rash, or ulcers.  Surgical wounds present.   MUSCULOSKELETAL:  No joint swelling, redness, or effusion of the large joints. Normal muscle tone.   NEURO:  Motor strength 5/5 in upper and lower extremities.  Sensation to fine touch intact all over.   LAB STUDIES: Show glucose of 120, BUN 9, creatinine 0.97. Sodium 138, potassium 4.1, WBC 10.5, hemoglobin 13.5, platelets 167.   ASSESSMENT AND PLAN:  1. Status post colectomy for colon cancer. Management as per surgical team.  2. Diabetes mellitus type 2. The patient will be on sliding scale insulin with  Accu-Cheks q. six hours as she is n.p.o. We will resume diabetic medications when the patient starts taking her oral diet, although at a lower dose.  3. Hypertension. Seems to be well controlled.  We will use p.r.n. IV medications for blood pressure control.  4. Sleep apnea. Continue CPAP at night.  5. Deep vein  thrombosis prophylaxis. As per surgery.   Thank you for the consult. We will follow the patient along with you in the hospital.   TIME SPENT IN COORDINATION OF CARE: 42 minutes.   ____________________________ Leia Alf Sherma Vanmetre, MD srs:bjt D: 10/25/2011 16:07:54 ET T: 10/25/2011 16:39:38 ET JOB#: [id number]  cc: Alveta Heimlich R. Darvin Neighbours, MD, <Dictator> Lavera Guise, MD Neita Carp MD ELECTRONICALLY SIGNED 10/27/2011 14:14

## 2014-08-27 DIAGNOSIS — I10 Essential (primary) hypertension: Secondary | ICD-10-CM | POA: Diagnosis not present

## 2014-08-27 DIAGNOSIS — F3341 Major depressive disorder, recurrent, in partial remission: Secondary | ICD-10-CM | POA: Diagnosis not present

## 2014-08-27 DIAGNOSIS — E1165 Type 2 diabetes mellitus with hyperglycemia: Secondary | ICD-10-CM | POA: Diagnosis not present

## 2014-08-31 ENCOUNTER — Other Ambulatory Visit: Payer: Self-pay | Admitting: *Deleted

## 2014-08-31 DIAGNOSIS — C189 Malignant neoplasm of colon, unspecified: Secondary | ICD-10-CM

## 2014-09-01 ENCOUNTER — Inpatient Hospital Stay: Payer: Commercial Managed Care - HMO | Attending: Internal Medicine

## 2014-09-01 ENCOUNTER — Other Ambulatory Visit
Admission: RE | Admit: 2014-09-01 | Discharge: 2014-09-01 | Disposition: A | Discharge status: Home / Self Care | Payer: Commercial Managed Care - HMO | Source: Ambulatory Visit | Attending: Internal Medicine | Admitting: Internal Medicine

## 2014-09-01 ENCOUNTER — Inpatient Hospital Stay: Payer: Commercial Managed Care - HMO

## 2014-09-01 DIAGNOSIS — C189 Malignant neoplasm of colon, unspecified: Secondary | ICD-10-CM

## 2014-09-01 DIAGNOSIS — I10 Essential (primary) hypertension: Secondary | ICD-10-CM | POA: Diagnosis not present

## 2014-09-01 DIAGNOSIS — E1122 Type 2 diabetes mellitus with diabetic chronic kidney disease: Secondary | ICD-10-CM | POA: Diagnosis not present

## 2014-09-01 DIAGNOSIS — C180 Malignant neoplasm of cecum: Secondary | ICD-10-CM | POA: Diagnosis not present

## 2014-09-01 LAB — FOLATE: Folate: 12.8 ng/mL (ref >5.9)

## 2014-09-01 LAB — CBC WITH DIFFERENTIAL/PLATELET
BASOS ABS: 0 10*3/uL (ref 0–0.1)
Basophils Relative: 0 %
EOS ABS: 0.2 10*3/uL (ref 0–0.7)
Eosinophils Relative: 3 %
HCT: 42.9 % (ref 35.0–47.0)
Hemoglobin: 14.3 g/dL (ref 12.0–16.0)
Lymphocytes Relative: 27 %
Lymphs Abs: 1.8 10*3/uL (ref 1.0–3.6)
MCH: 29.2 pg (ref 26.0–34.0)
MCHC: 33.3 g/dL (ref 32.0–36.0)
MCV: 87.8 fL (ref 80.0–100.0)
Monocytes Absolute: 0.6 10*3/uL (ref 0.2–0.9)
Monocytes Relative: 9 %
NEUTROS PCT: 61 %
Neutro Abs: 4.1 10*3/uL (ref 1.4–6.5)
PLATELETS: 165 10*3/uL (ref 150–440)
RBC: 4.89 MIL/uL (ref 3.80–5.20)
RDW: 13.8 % (ref 11.5–14.5)
WBC: 6.9 10*3/uL (ref 3.6–11.0)

## 2014-09-01 LAB — FERRITIN: Ferritin: 161 ng/mL (ref 11–307)

## 2014-09-01 LAB — COMPREHENSIVE METABOLIC PANEL
ALT: 46 U/L (ref 14–54)
ANION GAP: 12 (ref 5–15)
AST: 33 U/L (ref 15–41)
Albumin: 4.4 g/dL (ref 3.5–5.0)
Alkaline Phosphatase: 42 U/L (ref 38–126)
BUN: 20 mg/dL (ref 6–20)
CALCIUM: 9.2 mg/dL (ref 8.9–10.3)
CO2: 25 mmol/L (ref 22–32)
Chloride: 102 mmol/L (ref 101–111)
Creatinine, Ser: 0.95 mg/dL (ref 0.44–1.00)
GFR calc Af Amer: >60 mL/min (ref >60)
GFR calc non Af Amer: 60 mL/min — ABNORMAL LOW (ref >60)
Glucose, Bld: 228 mg/dL — ABNORMAL HIGH (ref 65–99)
Potassium: 3.8 mmol/L (ref 3.5–5.1)
Sodium: 139 mmol/L (ref 135–145)
TOTAL PROTEIN: 7.3 g/dL (ref 6.5–8.1)
Total Bilirubin: 0.6 mg/dL (ref 0.3–1.2)

## 2014-09-01 LAB — LIPID PANEL
CHOL/HDL RATIO: 5 ratio
Cholesterol: 154 mg/dL (ref 0–200)
HDL: 31 mg/dL — AB (ref >40)
LDL Cholesterol: 59 mg/dL (ref 0–99)
Triglycerides: 319 mg/dL — ABNORMAL HIGH (ref <150)
VLDL: 64 mg/dL — AB (ref 0–40)

## 2014-09-01 LAB — T4, FREE: Free T4: 0.95 ng/dL (ref 0.61–1.12)

## 2014-09-01 LAB — TSH: TSH: 2.066 u[IU]/mL (ref 0.350–4.500)

## 2014-09-01 MED ORDER — HEPARIN SOD (PORK) LOCK FLUSH 100 UNIT/ML IV SOLN
500.0000 [IU] | Freq: Once | INTRAVENOUS | Status: AC
  Administered 2014-09-01: 500 [IU] via INTRAVENOUS
  Filled 2014-09-01: qty 5

## 2014-09-01 MED ORDER — SODIUM CHLORIDE 0.9 % IJ SOLN
10.0000 mL | INTRAMUSCULAR | Status: DC | PRN
  Administered 2014-09-01: 10 mL via INTRAVENOUS
  Filled 2014-09-01: qty 10

## 2014-09-02 LAB — MISC LABCORP TEST (SEND OUT): LABCORP TEST CODE: 6072

## 2014-09-02 LAB — MICROALBUMIN, URINE: MICROALB UR: 30.6 ug/mL — AB

## 2014-09-02 LAB — VITAMIN B12: VITAMIN B 12: 297 pg/mL (ref 180–914)

## 2014-09-15 DIAGNOSIS — F3341 Major depressive disorder, recurrent, in partial remission: Secondary | ICD-10-CM | POA: Diagnosis not present

## 2014-09-15 DIAGNOSIS — G4733 Obstructive sleep apnea (adult) (pediatric): Secondary | ICD-10-CM | POA: Diagnosis not present

## 2014-09-15 DIAGNOSIS — I10 Essential (primary) hypertension: Secondary | ICD-10-CM | POA: Diagnosis not present

## 2014-09-15 DIAGNOSIS — D519 Vitamin B12 deficiency anemia, unspecified: Secondary | ICD-10-CM | POA: Diagnosis not present

## 2014-09-15 DIAGNOSIS — E782 Mixed hyperlipidemia: Secondary | ICD-10-CM | POA: Diagnosis not present

## 2014-09-15 DIAGNOSIS — Z8500 Personal history of malignant neoplasm of unspecified digestive organ: Secondary | ICD-10-CM | POA: Diagnosis not present

## 2014-09-15 DIAGNOSIS — E1165 Type 2 diabetes mellitus with hyperglycemia: Secondary | ICD-10-CM | POA: Diagnosis not present

## 2014-10-05 DIAGNOSIS — Z789 Other specified health status: Secondary | ICD-10-CM | POA: Diagnosis not present

## 2014-10-05 DIAGNOSIS — L578 Other skin changes due to chronic exposure to nonionizing radiation: Secondary | ICD-10-CM | POA: Diagnosis not present

## 2014-10-05 DIAGNOSIS — L728 Other follicular cysts of the skin and subcutaneous tissue: Secondary | ICD-10-CM | POA: Diagnosis not present

## 2014-10-05 DIAGNOSIS — L918 Other hypertrophic disorders of the skin: Secondary | ICD-10-CM | POA: Diagnosis not present

## 2014-10-05 DIAGNOSIS — Z1283 Encounter for screening for malignant neoplasm of skin: Secondary | ICD-10-CM | POA: Diagnosis not present

## 2014-10-05 DIAGNOSIS — B078 Other viral warts: Secondary | ICD-10-CM | POA: Diagnosis not present

## 2014-10-08 DIAGNOSIS — F3341 Major depressive disorder, recurrent, in partial remission: Secondary | ICD-10-CM | POA: Diagnosis not present

## 2014-10-08 DIAGNOSIS — C188 Malignant neoplasm of overlapping sites of colon: Secondary | ICD-10-CM | POA: Diagnosis not present

## 2014-10-08 DIAGNOSIS — E782 Mixed hyperlipidemia: Secondary | ICD-10-CM | POA: Diagnosis not present

## 2014-10-08 DIAGNOSIS — E1165 Type 2 diabetes mellitus with hyperglycemia: Secondary | ICD-10-CM | POA: Diagnosis not present

## 2014-10-08 DIAGNOSIS — I10 Essential (primary) hypertension: Secondary | ICD-10-CM | POA: Diagnosis not present

## 2014-10-12 ENCOUNTER — Inpatient Hospital Stay: Payer: Commercial Managed Care - HMO | Attending: Family Medicine | Admitting: Family Medicine

## 2014-10-12 ENCOUNTER — Other Ambulatory Visit: Payer: Self-pay | Admitting: Family Medicine

## 2014-10-12 ENCOUNTER — Inpatient Hospital Stay: Payer: Commercial Managed Care - HMO

## 2014-10-12 ENCOUNTER — Encounter: Payer: Self-pay | Admitting: Family Medicine

## 2014-10-12 VITALS — BP 133/74 | HR 84 | Temp 98.1°F | Resp 18 | Wt 151.2 lb

## 2014-10-12 DIAGNOSIS — Z9221 Personal history of antineoplastic chemotherapy: Secondary | ICD-10-CM | POA: Diagnosis not present

## 2014-10-12 DIAGNOSIS — Z87891 Personal history of nicotine dependence: Secondary | ICD-10-CM | POA: Insufficient documentation

## 2014-10-12 DIAGNOSIS — C189 Malignant neoplasm of colon, unspecified: Secondary | ICD-10-CM

## 2014-10-12 DIAGNOSIS — E785 Hyperlipidemia, unspecified: Secondary | ICD-10-CM | POA: Insufficient documentation

## 2014-10-12 DIAGNOSIS — C182 Malignant neoplasm of ascending colon: Secondary | ICD-10-CM | POA: Insufficient documentation

## 2014-10-12 DIAGNOSIS — Z9049 Acquired absence of other specified parts of digestive tract: Secondary | ICD-10-CM | POA: Insufficient documentation

## 2014-10-12 DIAGNOSIS — Z803 Family history of malignant neoplasm of breast: Secondary | ICD-10-CM | POA: Diagnosis not present

## 2014-10-12 DIAGNOSIS — Z95828 Presence of other vascular implants and grafts: Secondary | ICD-10-CM

## 2014-10-12 DIAGNOSIS — R918 Other nonspecific abnormal finding of lung field: Secondary | ICD-10-CM | POA: Insufficient documentation

## 2014-10-12 DIAGNOSIS — E119 Type 2 diabetes mellitus without complications: Secondary | ICD-10-CM | POA: Insufficient documentation

## 2014-10-12 DIAGNOSIS — I10 Essential (primary) hypertension: Secondary | ICD-10-CM | POA: Diagnosis not present

## 2014-10-12 DIAGNOSIS — Z800 Family history of malignant neoplasm of digestive organs: Secondary | ICD-10-CM | POA: Diagnosis not present

## 2014-10-12 DIAGNOSIS — Z85038 Personal history of other malignant neoplasm of large intestine: Secondary | ICD-10-CM | POA: Diagnosis not present

## 2014-10-12 DIAGNOSIS — Z923 Personal history of irradiation: Secondary | ICD-10-CM | POA: Diagnosis not present

## 2014-10-12 HISTORY — DX: Malignant neoplasm of colon, unspecified: C18.9

## 2014-10-12 LAB — COMPREHENSIVE METABOLIC PANEL
ALT: 51 U/L (ref 14–54)
ANION GAP: 11 (ref 5–15)
AST: 32 U/L (ref 15–41)
Albumin: 4.5 g/dL (ref 3.5–5.0)
Alkaline Phosphatase: 39 U/L (ref 38–126)
BUN: 21 mg/dL — AB (ref 6–20)
CHLORIDE: 100 mmol/L — AB (ref 101–111)
CO2: 26 mmol/L (ref 22–32)
Calcium: 9.3 mg/dL (ref 8.9–10.3)
Creatinine, Ser: 0.95 mg/dL (ref 0.44–1.00)
GFR calc Af Amer: >60 mL/min (ref >60)
GFR calc non Af Amer: 60 mL/min — ABNORMAL LOW (ref >60)
GLUCOSE: 163 mg/dL — AB (ref 65–99)
POTASSIUM: 4.2 mmol/L (ref 3.5–5.1)
Sodium: 137 mmol/L (ref 135–145)
Total Bilirubin: 0.4 mg/dL (ref 0.3–1.2)
Total Protein: 7.4 g/dL (ref 6.5–8.1)

## 2014-10-12 LAB — CBC WITH DIFFERENTIAL/PLATELET
Basophils Absolute: 0 10*3/uL (ref 0–0.1)
Basophils Relative: 0 %
EOS PCT: 3 %
Eosinophils Absolute: 0.2 10*3/uL (ref 0–0.7)
HEMATOCRIT: 44.2 % (ref 35.0–47.0)
Hemoglobin: 14.7 g/dL (ref 12.0–16.0)
LYMPHS ABS: 2.1 10*3/uL (ref 1.0–3.6)
Lymphocytes Relative: 25 %
MCH: 29.2 pg (ref 26.0–34.0)
MCHC: 33.4 g/dL (ref 32.0–36.0)
MCV: 87.6 fL (ref 80.0–100.0)
Monocytes Absolute: 0.8 10*3/uL (ref 0.2–0.9)
Monocytes Relative: 9 %
Neutro Abs: 5.4 10*3/uL (ref 1.4–6.5)
Neutrophils Relative %: 63 %
PLATELETS: 188 10*3/uL (ref 150–440)
RBC: 5.04 MIL/uL (ref 3.80–5.20)
RDW: 13.9 % (ref 11.5–14.5)
WBC: 8.5 10*3/uL (ref 3.6–11.0)

## 2014-10-12 MED ORDER — HEPARIN SOD (PORK) LOCK FLUSH 100 UNIT/ML IV SOLN
500.0000 [IU] | Freq: Once | INTRAVENOUS | Status: AC
  Administered 2014-10-12: 500 [IU] via INTRAVENOUS

## 2014-10-12 MED ORDER — LIDOCAINE-PRILOCAINE 2.5-2.5 % EX CREA: 1.0000 "application " | TOPICAL_CREAM | CUTANEOUS | Status: DC | PRN

## 2014-10-12 MED ORDER — SODIUM CHLORIDE 0.9 % IJ SOLN
10.0000 mL | INTRAMUSCULAR | Status: DC | PRN
  Administered 2014-10-12: 10 mL via INTRAVENOUS
  Filled 2014-10-12: qty 10

## 2014-10-12 NOTE — Progress Notes (Signed)
[place]  [phone]  [phone]     Tiffany Hawkins DOB: 1945-10-25  MR#: [id number]  TXM#:[id number]  No care team member to display  CHIEF COMPLAINT:  Chief Complaint  Patient presents with  . Follow-up    colon cancer   History of colon cancer of the cecum, intramucosal by colonoscopy in June 2013. 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 0 of 18 examined nodes were positive.  INTERVAL HISTORY:  Patient is a 69 year old female with past history of colon cancer from 2013, stage IIB. She reports having a change in lifestyle habits resulting in the 10 pound weight loss over the last several months. She is also managing her diabetes more closely, eating better, and exercising more. She overall feels very well and denies any complaints today.  REVIEW OF SYSTEMS:   Review of Systems  Constitutional: Negative for fever, chills, weight loss, malaise/fatigue and diaphoresis.  HENT: Negative for congestion, ear discharge, ear pain, hearing loss, nosebleeds, sore throat and tinnitus.   Eyes: Negative for blurred vision, double vision, photophobia, pain, discharge and redness.  Respiratory: Negative for cough, hemoptysis, sputum production, shortness of breath, wheezing and stridor.   Cardiovascular: Negative for chest pain, palpitations, orthopnea, claudication, leg swelling and PND.  Gastrointestinal: Negative for heartburn, nausea, vomiting, abdominal pain, diarrhea, constipation, blood in stool and melena.  Genitourinary: Negative.   Musculoskeletal: Negative.   Skin: Negative.   Neurological: Negative for dizziness, tingling, focal weakness, seizures, weakness and headaches.  Endo/Heme/Allergies: Does not bruise/bleed easily.  Psychiatric/Behavioral: Negative for  depression. The patient is not nervous/anxious and does not have insomnia.     As per HPI. Otherwise, a complete review of systems is negatve.  ONCOLOGY HISTORY:  No history exists.    PAST MEDICAL HISTORY: Past Medical History  Diagnosis Date  . Colon cancer 10/12/2014  . Hypertension   . Diabetes mellitus without complication   . Hyperlipidemia     PAST SURGICAL HISTORY: Past Surgical History  Procedure Laterality Date  . Appendectomy    . Cholecystectomy    . Tubal ligation      FAMILY HISTORY Family History  Problem Relation Age of Onset  . Alzheimer's disease Mother   . Heart disease Father   . Colon cancer Sister   . Breast cancer Cousin   . Breast cancer Cousin     GYNECOLOGIC HISTORY:  No LMP recorded.     ADVANCED DIRECTIVES:    HEALTH MAINTENANCE: History  Substance Use Topics  . Smoking status: Former Research scientist (life sciences)  . Smokeless tobacco: Never Used  . Alcohol Use: No     Colonoscopy:  PAP:  Bone density:  Lipid panel:  No Known Allergies  Current Outpatient Prescriptions  Medication Sig Dispense Refill  . bisoprolol-hydrochlorothiazide (ZIAC) 5-6.25 MG per tablet Take by mouth.    . citalopram (CELEXA) 20 MG tablet Take by mouth.    . Cyanocobalamin 2500 MCG SUBL Place under the tongue.    . fenofibrate 54 MG tablet Take by mouth.    Marland Kitchen glipiZIDE-metformin (METAGLIP) 5-500 MG per tablet Take by mouth.    . lovastatin (MEVACOR) 20 MG tablet Take by mouth.    . magnesium oxide (MAG-OX) 400 MG tablet Take by mouth.    . Multiple Vitamin (MULTI-VITAMINS) TABS Take by mouth.    . pantoprazole (  PROTONIX) 20 MG tablet Take by mouth.    . sitaGLIPtin (JANUVIA) 100 MG tablet Take by mouth.    . Vitamin D, Ergocalciferol, (DRISDOL) 50000 UNITS CAPS capsule Take by mouth.    . lidocaine-prilocaine (EMLA) cream Apply 1 application topically as needed. 30 g 0   No current facility-administered medications for this visit.   Facility-Administered  Medications Ordered in Other Visits  Medication Dose Route Frequency Provider Last Rate Last Dose  . sodium chloride 0.9 % injection 10 mL  10 mL Intravenous PRN Evlyn Kanner, NP   10 mL at 10/12/14 1215    OBJECTIVE: BP 133/74 mmHg  Pulse 84  Temp(Src) 98.1 F (36.7 C)  Resp 18  Wt 151 lb 3.8 oz (68.601 kg)   There is no height on file to calculate BMI.    ECOG FS:0 - Asymptomatic  General: Well-developed, well-nourished, no acute distress. Eyes: Pink conjunctiva, anicteric sclera. HEENT: Normocephalic, moist mucous membranes, clear oropharnyx. Lungs: Clear to auscultation bilaterally. Heart: Regular rate and rhythm. No rubs, murmurs, or gallops. Abdomen: Soft, nontender, nondistended. No organomegaly noted, normoactive bowel sounds. Musculoskeletal: No edema, cyanosis, or clubbing. Neuro: Alert, answering all questions appropriately. Cranial nerves grossly intact. Skin: No rashes or petechiae noted. Psych: Normal affect. Lymphatics: No cervical, calvicular, axillary or inguinal LAD.   LAB RESULTS:  Infusion on 10/12/2014  Component Date Value Ref Range Status  . WBC 10/12/2014 8.5  3.6 - 11.0 K/uL Final   A-LINE DRAW  . RBC 10/12/2014 5.04  3.80 - 5.20 MIL/uL Final  . Hemoglobin 10/12/2014 14.7  12.0 - 16.0 g/dL Final  . HCT 10/12/2014 44.2  35.0 - 47.0 % Final  . MCV 10/12/2014 87.6  80.0 - 100.0 fL Final  . MCH 10/12/2014 29.2  26.0 - 34.0 pg Final  . MCHC 10/12/2014 33.4  32.0 - 36.0 g/dL Final  . RDW 10/12/2014 13.9  11.5 - 14.5 % Final  . Platelets 10/12/2014 188  150 - 440 K/uL Final  . Neutrophils Relative % 10/12/2014 63   Final  . Neutro Abs 10/12/2014 5.4  1.4 - 6.5 K/uL Final  . Lymphocytes Relative 10/12/2014 25   Final  . Lymphs Abs 10/12/2014 2.1  1.0 - 3.6 K/uL Final  . Monocytes Relative 10/12/2014 9   Final  . Monocytes Absolute 10/12/2014 0.8  0.2 - 0.9 K/uL Final  . Eosinophils Relative 10/12/2014 3   Final  . Eosinophils Absolute 10/12/2014 0.2   0 - 0.7 K/uL Final  . Basophils Relative 10/12/2014 0   Final  . Basophils Absolute 10/12/2014 0.0  0 - 0.1 K/uL Final  . Sodium 10/12/2014 137  135 - 145 mmol/L Final  . Potassium 10/12/2014 4.2  3.5 - 5.1 mmol/L Final  . Chloride 10/12/2014 100* 101 - 111 mmol/L Final  . CO2 10/12/2014 26  22 - 32 mmol/L Final  . Glucose, Bld 10/12/2014 163* 65 - 99 mg/dL Final  . BUN 10/12/2014 21* 6 - 20 mg/dL Final  . Creatinine, Ser 10/12/2014 0.95  0.44 - 1.00 mg/dL Final  . Calcium 10/12/2014 9.3  8.9 - 10.3 mg/dL Final  . Total Protein 10/12/2014 7.4  6.5 - 8.1 g/dL Final  . Albumin 10/12/2014 4.5  3.5 - 5.0 g/dL Final  . AST 10/12/2014 32  15 - 41 U/L Final  . ALT 10/12/2014 51  14 - 54 U/L Final  . Alkaline Phosphatase 10/12/2014 39  38 - 126 U/L Final  . Total Bilirubin 10/12/2014 0.4  0.3 - 1.2 mg/dL Final  . GFR calc non Af Amer 10/12/2014 60* >60 mL/min Final  . GFR calc Af Amer 10/12/2014 >60  >60 mL/min Final   Comment: (NOTE) The eGFR has been calculated using the CKD EPI equation. This calculation has not been validated in all clinical situations. eGFR's persistently <60 mL/min signify possible Chronic Kidney Disease.   . Anion gap 10/12/2014 11  5 - 15 Final    STUDIES: No results found.  ASSESSMENT:  Stage II B colon cancer  PLAN:   1. Colon CA. Patient completed adjuvant chemotherapy as well as radiation. Most recent imaging was in March 2016, no evidence of local recurrence. She was noted to have some stable small bilateral pulmonary nodules that appeared benign, no lymphadenopathy noted. She was found to have extensive colonic diverticulosis. She is scheduled for next colonoscopy to be due in October 2016 with Dr. Vira Agar.  We'll continue with further evaluation of labs today as well as every 6 week port flushes.  Patient will be reevaluated again in 3 months.  Patient expressed understanding and was in agreement with this plan. She also understands that She can call  clinic at any time with any questions, concerns, or complaints.   Dr. Oliva Bustard was available for consultation and review of plan of care for this patient.  Evlyn Kanner, NP   10/12/2014 2:19 PM

## 2014-10-13 ENCOUNTER — Ambulatory Visit: Payer: Self-pay | Admitting: Family Medicine

## 2014-10-13 LAB — CEA: CEA: 1.5 ng/mL (ref 0.0–4.7)

## 2014-11-10 DIAGNOSIS — K219 Gastro-esophageal reflux disease without esophagitis: Secondary | ICD-10-CM | POA: Diagnosis not present

## 2014-11-10 DIAGNOSIS — Z0001 Encounter for general adult medical examination with abnormal findings: Secondary | ICD-10-CM | POA: Diagnosis not present

## 2014-11-10 DIAGNOSIS — F3341 Major depressive disorder, recurrent, in partial remission: Secondary | ICD-10-CM | POA: Diagnosis not present

## 2014-11-10 DIAGNOSIS — E1165 Type 2 diabetes mellitus with hyperglycemia: Secondary | ICD-10-CM | POA: Diagnosis not present

## 2014-11-10 DIAGNOSIS — I10 Essential (primary) hypertension: Secondary | ICD-10-CM | POA: Diagnosis not present

## 2014-11-24 ENCOUNTER — Inpatient Hospital Stay: Payer: Commercial Managed Care - HMO | Attending: Family Medicine

## 2014-11-24 DIAGNOSIS — Z452 Encounter for adjustment and management of vascular access device: Secondary | ICD-10-CM | POA: Diagnosis not present

## 2014-11-24 DIAGNOSIS — Z95828 Presence of other vascular implants and grafts: Secondary | ICD-10-CM

## 2014-11-24 DIAGNOSIS — C189 Malignant neoplasm of colon, unspecified: Secondary | ICD-10-CM | POA: Diagnosis not present

## 2014-11-24 MED ORDER — SODIUM CHLORIDE 0.9 % IJ SOLN
10.0000 mL | INTRAMUSCULAR | Status: DC | PRN
  Administered 2014-11-24: 10 mL via INTRAVENOUS
  Filled 2014-11-24: qty 10

## 2014-11-24 MED ORDER — HEPARIN SOD (PORK) LOCK FLUSH 100 UNIT/ML IV SOLN
500.0000 [IU] | Freq: Once | INTRAVENOUS | Status: AC
  Administered 2014-11-24: 500 [IU] via INTRAVENOUS

## 2015-01-04 ENCOUNTER — Other Ambulatory Visit: Payer: Self-pay | Admitting: Internal Medicine

## 2015-01-04 DIAGNOSIS — C189 Malignant neoplasm of colon, unspecified: Secondary | ICD-10-CM

## 2015-01-05 ENCOUNTER — Inpatient Hospital Stay: Payer: Commercial Managed Care - HMO | Admitting: Internal Medicine

## 2015-01-05 ENCOUNTER — Inpatient Hospital Stay: Payer: Commercial Managed Care - HMO

## 2015-01-05 ENCOUNTER — Other Ambulatory Visit: Payer: Commercial Managed Care - HMO

## 2015-01-05 DIAGNOSIS — I10 Essential (primary) hypertension: Secondary | ICD-10-CM | POA: Diagnosis not present

## 2015-01-05 DIAGNOSIS — Z8500 Personal history of malignant neoplasm of unspecified digestive organ: Secondary | ICD-10-CM | POA: Diagnosis not present

## 2015-01-05 DIAGNOSIS — R309 Painful micturition, unspecified: Secondary | ICD-10-CM | POA: Diagnosis not present

## 2015-01-05 DIAGNOSIS — F3341 Major depressive disorder, recurrent, in partial remission: Secondary | ICD-10-CM | POA: Diagnosis not present

## 2015-01-05 DIAGNOSIS — G4733 Obstructive sleep apnea (adult) (pediatric): Secondary | ICD-10-CM | POA: Diagnosis not present

## 2015-01-05 DIAGNOSIS — E1165 Type 2 diabetes mellitus with hyperglycemia: Secondary | ICD-10-CM | POA: Diagnosis not present

## 2015-02-02 DIAGNOSIS — H2513 Age-related nuclear cataract, bilateral: Secondary | ICD-10-CM | POA: Diagnosis not present

## 2015-02-16 ENCOUNTER — Inpatient Hospital Stay: Payer: Commercial Managed Care - HMO

## 2015-02-23 ENCOUNTER — Other Ambulatory Visit: Payer: Commercial Managed Care - HMO

## 2015-02-23 ENCOUNTER — Inpatient Hospital Stay: Payer: Commercial Managed Care - HMO

## 2015-02-23 ENCOUNTER — Ambulatory Visit: Payer: Commercial Managed Care - HMO | Admitting: Internal Medicine

## 2015-02-24 ENCOUNTER — Ambulatory Visit: Payer: Commercial Managed Care - HMO | Admitting: Hematology and Oncology

## 2015-02-24 ENCOUNTER — Inpatient Hospital Stay: Payer: Commercial Managed Care - HMO

## 2015-02-24 ENCOUNTER — Other Ambulatory Visit: Payer: Commercial Managed Care - HMO

## 2015-02-26 ENCOUNTER — Inpatient Hospital Stay (HOSPITAL_BASED_OUTPATIENT_CLINIC_OR_DEPARTMENT_OTHER): Payer: Commercial Managed Care - HMO | Admitting: Oncology

## 2015-02-26 ENCOUNTER — Inpatient Hospital Stay: Payer: Commercial Managed Care - HMO

## 2015-02-26 ENCOUNTER — Inpatient Hospital Stay: Payer: Commercial Managed Care - HMO | Attending: Oncology

## 2015-02-26 VITALS — BP 154/86 | HR 87 | Temp 98.2°F | Resp 18 | Wt 151.2 lb

## 2015-02-26 DIAGNOSIS — I10 Essential (primary) hypertension: Secondary | ICD-10-CM | POA: Insufficient documentation

## 2015-02-26 DIAGNOSIS — Z87891 Personal history of nicotine dependence: Secondary | ICD-10-CM | POA: Diagnosis not present

## 2015-02-26 DIAGNOSIS — Z794 Long term (current) use of insulin: Secondary | ICD-10-CM

## 2015-02-26 DIAGNOSIS — E785 Hyperlipidemia, unspecified: Secondary | ICD-10-CM

## 2015-02-26 DIAGNOSIS — E119 Type 2 diabetes mellitus without complications: Secondary | ICD-10-CM | POA: Diagnosis not present

## 2015-02-26 DIAGNOSIS — Z79899 Other long term (current) drug therapy: Secondary | ICD-10-CM | POA: Insufficient documentation

## 2015-02-26 DIAGNOSIS — Z800 Family history of malignant neoplasm of digestive organs: Secondary | ICD-10-CM

## 2015-02-26 DIAGNOSIS — C189 Malignant neoplasm of colon, unspecified: Secondary | ICD-10-CM

## 2015-02-26 DIAGNOSIS — Z85038 Personal history of other malignant neoplasm of large intestine: Secondary | ICD-10-CM | POA: Diagnosis not present

## 2015-02-26 DIAGNOSIS — Z803 Family history of malignant neoplasm of breast: Secondary | ICD-10-CM | POA: Insufficient documentation

## 2015-02-26 LAB — CBC WITH DIFFERENTIAL/PLATELET
BASOS ABS: 0 10*3/uL (ref 0–0.1)
BASOS PCT: 1 %
Eosinophils Absolute: 0.3 10*3/uL (ref 0–0.7)
Eosinophils Relative: 4 %
HEMATOCRIT: 43.9 % (ref 35.0–47.0)
HEMOGLOBIN: 14.6 g/dL (ref 12.0–16.0)
Lymphocytes Relative: 32 %
Lymphs Abs: 2.2 10*3/uL (ref 1.0–3.6)
MCH: 29.1 pg (ref 26.0–34.0)
MCHC: 33.4 g/dL (ref 32.0–36.0)
MCV: 87.4 fL (ref 80.0–100.0)
Monocytes Absolute: 0.7 10*3/uL (ref 0.2–0.9)
Monocytes Relative: 11 %
NEUTROS ABS: 3.6 10*3/uL (ref 1.4–6.5)
NEUTROS PCT: 52 %
Platelets: 171 10*3/uL (ref 150–440)
RBC: 5.02 MIL/uL (ref 3.80–5.20)
RDW: 13.4 % (ref 11.5–14.5)
WBC: 6.9 10*3/uL (ref 3.6–11.0)

## 2015-02-26 LAB — COMPREHENSIVE METABOLIC PANEL
ALK PHOS: 42 U/L (ref 38–126)
ALT: 34 U/L (ref 14–54)
ANION GAP: 8 (ref 5–15)
AST: 24 U/L (ref 15–41)
Albumin: 4.1 g/dL (ref 3.5–5.0)
BUN: 15 mg/dL (ref 6–20)
CALCIUM: 8.8 mg/dL — AB (ref 8.9–10.3)
CO2: 27 mmol/L (ref 22–32)
Chloride: 104 mmol/L (ref 101–111)
Creatinine, Ser: 0.95 mg/dL (ref 0.44–1.00)
GFR calc Af Amer: >60 mL/min (ref >60)
GFR calc non Af Amer: 59 mL/min — ABNORMAL LOW (ref >60)
GLUCOSE: 153 mg/dL — AB (ref 65–99)
Potassium: 3.9 mmol/L (ref 3.5–5.1)
SODIUM: 139 mmol/L (ref 135–145)
Total Bilirubin: 0.3 mg/dL (ref 0.3–1.2)
Total Protein: 7.2 g/dL (ref 6.5–8.1)

## 2015-02-26 MED ORDER — HEPARIN SOD (PORK) LOCK FLUSH 100 UNIT/ML IV SOLN
INTRAVENOUS | Status: AC
Start: 2015-02-26 — End: 2015-02-26
  Filled 2015-02-26: qty 5

## 2015-02-26 MED ORDER — SODIUM CHLORIDE 0.9 % IJ SOLN
10.0000 mL | INTRAMUSCULAR | Status: DC | PRN
  Administered 2015-02-26: 10 mL via INTRAVENOUS
  Filled 2015-02-26: qty 10

## 2015-02-26 MED ORDER — HEPARIN SOD (PORK) LOCK FLUSH 100 UNIT/ML IV SOLN
500.0000 [IU] | Freq: Once | INTRAVENOUS | Status: AC
  Administered 2015-02-26: 500 [IU] via INTRAVENOUS

## 2015-02-26 NOTE — Progress Notes (Signed)
Patient offers no concerns or problems today.

## 2015-02-27 LAB — CEA: CEA: 2.1 ng/mL (ref 0.0–4.7)

## 2015-03-08 ENCOUNTER — Other Ambulatory Visit: Payer: Self-pay | Admitting: Nurse Practitioner

## 2015-03-08 DIAGNOSIS — F411 Generalized anxiety disorder: Secondary | ICD-10-CM | POA: Diagnosis not present

## 2015-03-08 DIAGNOSIS — I10 Essential (primary) hypertension: Secondary | ICD-10-CM | POA: Diagnosis not present

## 2015-03-08 DIAGNOSIS — Z8500 Personal history of malignant neoplasm of unspecified digestive organ: Secondary | ICD-10-CM | POA: Diagnosis not present

## 2015-03-08 DIAGNOSIS — Z1231 Encounter for screening mammogram for malignant neoplasm of breast: Secondary | ICD-10-CM

## 2015-03-08 DIAGNOSIS — R110 Nausea: Secondary | ICD-10-CM | POA: Diagnosis not present

## 2015-03-08 DIAGNOSIS — E119 Type 2 diabetes mellitus without complications: Secondary | ICD-10-CM | POA: Diagnosis not present

## 2015-03-08 DIAGNOSIS — F331 Major depressive disorder, recurrent, moderate: Secondary | ICD-10-CM | POA: Diagnosis not present

## 2015-03-08 DIAGNOSIS — E2839 Other primary ovarian failure: Secondary | ICD-10-CM

## 2015-03-15 NOTE — Progress Notes (Signed)
[name]  [phone] Fax:(336) [phone]  ID: Tiffany Hawkins OB: 1945-11-18  MR#: [id number]  [id number]  No care team member to display  CHIEF COMPLAINT:  Chief Complaint  Patient presents with  . Colon Cancer    INTERVAL HISTORY: Patient returns to clinic today for repeat laboratory work and further evaluation. She currently feels well and is asymptomatic. She has no neurologic complaints. She denies any recent fevers. She has good appetite and denies weight loss. She denies any chest pain or shortness of breath. She has no abdominal pain and is noted no changes in her bowel movements. She denies any nausea, vomiting, constipation, diarrhea. She has no melena or hematochezia. Patient feels at her baseline and offers no specific complaints today.  REVIEW OF SYSTEMS:   Review of Systems  Constitutional: Negative.   Respiratory: Negative.   Cardiovascular: Negative.   Gastrointestinal: Negative.  Negative for blood in stool and melena.  Musculoskeletal: Negative.   Neurological: Negative.     As per HPI. Otherwise, a complete review of systems is negatve.  PAST MEDICAL HISTORY: Past Medical History  Diagnosis Date  . Colon cancer 10/12/2014  . Hypertension   . Diabetes mellitus without complication   . Hyperlipidemia     PAST SURGICAL HISTORY: Past Surgical History  Procedure Laterality Date  . Appendectomy    . Cholecystectomy    . Tubal ligation      FAMILY HISTORY Family History  Problem Relation Age of Onset  . Alzheimer's disease Mother   . Heart disease Father   . Colon cancer Sister   . Breast cancer Cousin   . Breast cancer Cousin        ADVANCED DIRECTIVES:    HEALTH MAINTENANCE: Social History  Substance Use Topics  . Smoking status: Former Research scientist (life sciences)  . Smokeless tobacco: Never Used  . Alcohol Use: No     Colonoscopy:  PAP:  Bone density:  Lipid panel:  No Known Allergies  Current Outpatient  Prescriptions  Medication Sig Dispense Refill  . citalopram (CELEXA) 20 MG tablet Take by mouth.    . Cyanocobalamin 2500 MCG SUBL Place 1,000 mcg under the tongue.     . fenofibrate 54 MG tablet Take by mouth.    . gabapentin (NEURONTIN) 300 MG capsule Take 300 mg by mouth at bedtime.    . insulin aspart (NOVOLOG) 100 UNIT/ML injection Inject 10 Units into the skin daily.    Marland Kitchen lidocaine-prilocaine (EMLA) cream Apply 1 application topically as needed. 30 g 0  . Liraglutide (VICTOZA [place]) Inject into the skin.    Marland Kitchen lovastatin (MEVACOR) 20 MG tablet Take by mouth.    . Multiple Vitamin (MULTI-VITAMINS) TABS Take by mouth.    . pantoprazole (PROTONIX) 20 MG tablet Take by mouth.    . Vitamin D, Ergocalciferol, (DRISDOL) 50000 UNITS CAPS capsule Take by mouth.     No current facility-administered medications for this visit.    OBJECTIVE: Filed Vitals:   02/26/15 1037  BP: 154/86  Pulse: 87  Temp: 98.2 F (36.8 C)  Resp: 18     There is no height on file to calculate BMI.    ECOG FS:0 - Asymptomatic  General: Well-developed, well-nourished, no acute distress. Eyes: Pink conjunctiva, anicteric sclera. Lungs: Clear to auscultation bilaterally. Heart: Regular rate and rhythm. No rubs, murmurs, or gallops. Abdomen: Soft, nontender, nondistended. No organomegaly noted, normoactive bowel sounds. Musculoskeletal: No edema, cyanosis, or clubbing. Neuro: Alert, answering all questions appropriately. Cranial  nerves grossly intact. Skin: No rashes or petechiae noted. Psych: Normal affect.   LAB RESULTS:  Lab Results  Component Value Date   NA 139 02/26/2015   K 3.9 02/26/2015   CL 104 02/26/2015   CO2 27 02/26/2015   GLUCOSE 153* 02/26/2015   BUN 15 02/26/2015   CREATININE 0.95 02/26/2015   CALCIUM 8.8* 02/26/2015   PROT 7.2 02/26/2015   ALBUMIN 4.1 02/26/2015   AST 24 02/26/2015   ALT 34 02/26/2015   ALKPHOS 42 02/26/2015   BILITOT 0.3 02/26/2015   GFRNONAA 59* 02/26/2015    GFRAA >60 02/26/2015    Lab Results  Component Value Date   WBC 6.9 02/26/2015   NEUTROABS 3.6 02/26/2015   HGB 14.6 02/26/2015   HCT 43.9 02/26/2015   MCV 87.4 02/26/2015   PLT 171 02/26/2015     STUDIES: No results found.  ASSESSMENT: Stage IIB adenocarcinoma of the colon  PLAN:    1. Colon cancer: Although patient was stage IIB, she was noted to have positive margins on her surgical resection therefore she underwent adjuvant chemotherapy and radiation therapy which she completed in March 2014. She had a CT scan completed March 2016 which revealed no evidence of disease. Her CEA is 2.1. She reports that she has a colonoscopy in the next several weeks. Now the patient is greater than 2 years removed from completing her treatments she can be switched to lab work and evaluation every 6 months.  Patient expressed understanding and was in agreement with this plan. She also understands that She can call clinic at any time with any questions, concerns, or complaints.   Colon cancer [facility])   Staging form: Colon and Rectum, AJCC 7th Edition     Clinical stage from 10/12/2011: Stage IIB (T4a, N0, M0) - Signed by Evlyn Kanner, NP on 10/12/2014   Lloyd Huger, MD   03/15/2015 11:03 PM

## 2015-03-17 ENCOUNTER — Ambulatory Visit: Payer: Commercial Managed Care - HMO

## 2015-03-17 ENCOUNTER — Other Ambulatory Visit: Payer: Commercial Managed Care - HMO

## 2015-03-18 ENCOUNTER — Ambulatory Visit
Admission: RE | Admit: 2015-03-18 | Discharge: 2015-03-18 | Disposition: A | Discharge status: Home / Self Care | Payer: Commercial Managed Care - HMO | Source: Ambulatory Visit | Attending: Nurse Practitioner | Admitting: Nurse Practitioner

## 2015-03-18 DIAGNOSIS — M810 Age-related osteoporosis without current pathological fracture: Secondary | ICD-10-CM | POA: Diagnosis not present

## 2015-03-18 DIAGNOSIS — E2839 Other primary ovarian failure: Secondary | ICD-10-CM | POA: Insufficient documentation

## 2015-03-18 DIAGNOSIS — Z1231 Encounter for screening mammogram for malignant neoplasm of breast: Secondary | ICD-10-CM | POA: Insufficient documentation

## 2015-03-30 ENCOUNTER — Inpatient Hospital Stay: Payer: Commercial Managed Care - HMO | Attending: Family Medicine

## 2015-04-06 DIAGNOSIS — J0101 Acute recurrent maxillary sinusitis: Secondary | ICD-10-CM | POA: Diagnosis not present

## 2015-04-13 DIAGNOSIS — Z85038 Personal history of other malignant neoplasm of large intestine: Secondary | ICD-10-CM | POA: Diagnosis not present

## 2015-05-11 ENCOUNTER — Inpatient Hospital Stay: Payer: Commercial Managed Care - HMO | Attending: Family Medicine

## 2015-05-14 ENCOUNTER — Encounter: Payer: Self-pay | Admitting: *Deleted

## 2015-05-17 ENCOUNTER — Ambulatory Visit: Payer: Medicare HMO | Admitting: Anesthesiology

## 2015-05-17 ENCOUNTER — Encounter
Admission: RE | Disposition: A | Discharge status: Home / Self Care | Payer: Self-pay | Source: Ambulatory Visit | Attending: Unknown Physician Specialty

## 2015-05-17 ENCOUNTER — Ambulatory Visit
Admission: RE | Admit: 2015-05-17 | Discharge: 2015-05-17 | Disposition: A | Discharge status: Home / Self Care | Payer: Medicare HMO | Source: Ambulatory Visit | Attending: Unknown Physician Specialty | Admitting: Unknown Physician Specialty

## 2015-05-17 DIAGNOSIS — K648 Other hemorrhoids: Secondary | ICD-10-CM | POA: Diagnosis not present

## 2015-05-17 DIAGNOSIS — K5730 Diverticulosis of large intestine without perforation or abscess without bleeding: Secondary | ICD-10-CM | POA: Diagnosis not present

## 2015-05-17 DIAGNOSIS — M7710 Lateral epicondylitis, unspecified elbow: Secondary | ICD-10-CM | POA: Diagnosis not present

## 2015-05-17 DIAGNOSIS — E785 Hyperlipidemia, unspecified: Secondary | ICD-10-CM | POA: Insufficient documentation

## 2015-05-17 DIAGNOSIS — E119 Type 2 diabetes mellitus without complications: Secondary | ICD-10-CM | POA: Insufficient documentation

## 2015-05-17 DIAGNOSIS — Z8249 Family history of ischemic heart disease and other diseases of the circulatory system: Secondary | ICD-10-CM | POA: Insufficient documentation

## 2015-05-17 DIAGNOSIS — Z794 Long term (current) use of insulin: Secondary | ICD-10-CM | POA: Insufficient documentation

## 2015-05-17 DIAGNOSIS — D123 Benign neoplasm of transverse colon: Secondary | ICD-10-CM | POA: Insufficient documentation

## 2015-05-17 DIAGNOSIS — K5190 Ulcerative colitis, unspecified, without complications: Secondary | ICD-10-CM | POA: Diagnosis not present

## 2015-05-17 DIAGNOSIS — Z800 Family history of malignant neoplasm of digestive organs: Secondary | ICD-10-CM | POA: Diagnosis not present

## 2015-05-17 DIAGNOSIS — K635 Polyp of colon: Secondary | ICD-10-CM | POA: Diagnosis not present

## 2015-05-17 DIAGNOSIS — K633 Ulcer of intestine: Secondary | ICD-10-CM | POA: Diagnosis not present

## 2015-05-17 DIAGNOSIS — Z85038 Personal history of other malignant neoplasm of large intestine: Secondary | ICD-10-CM | POA: Insufficient documentation

## 2015-05-17 DIAGNOSIS — I10 Essential (primary) hypertension: Secondary | ICD-10-CM | POA: Diagnosis not present

## 2015-05-17 DIAGNOSIS — Z79899 Other long term (current) drug therapy: Secondary | ICD-10-CM | POA: Insufficient documentation

## 2015-05-17 DIAGNOSIS — F419 Anxiety disorder, unspecified: Secondary | ICD-10-CM | POA: Insufficient documentation

## 2015-05-17 DIAGNOSIS — Z9049 Acquired absence of other specified parts of digestive tract: Secondary | ICD-10-CM | POA: Diagnosis not present

## 2015-05-17 DIAGNOSIS — K529 Noninfective gastroenteritis and colitis, unspecified: Secondary | ICD-10-CM | POA: Diagnosis not present

## 2015-05-17 DIAGNOSIS — Z803 Family history of malignant neoplasm of breast: Secondary | ICD-10-CM | POA: Diagnosis not present

## 2015-05-17 DIAGNOSIS — K640 First degree hemorrhoids: Secondary | ICD-10-CM | POA: Diagnosis not present

## 2015-05-17 DIAGNOSIS — K5289 Other specified noninfective gastroenteritis and colitis: Secondary | ICD-10-CM | POA: Diagnosis not present

## 2015-05-17 DIAGNOSIS — Z8612 Personal history of poliomyelitis: Secondary | ICD-10-CM | POA: Diagnosis not present

## 2015-05-17 DIAGNOSIS — G4730 Sleep apnea, unspecified: Secondary | ICD-10-CM | POA: Insufficient documentation

## 2015-05-17 DIAGNOSIS — Z87891 Personal history of nicotine dependence: Secondary | ICD-10-CM | POA: Diagnosis not present

## 2015-05-17 DIAGNOSIS — Z980 Intestinal bypass and anastomosis status: Secondary | ICD-10-CM | POA: Diagnosis not present

## 2015-05-17 DIAGNOSIS — Z820 Family history of epilepsy and other diseases of the nervous system: Secondary | ICD-10-CM | POA: Insufficient documentation

## 2015-05-17 DIAGNOSIS — K5790 Diverticulosis of intestine, part unspecified, without perforation or abscess without bleeding: Secondary | ICD-10-CM | POA: Diagnosis not present

## 2015-05-17 HISTORY — DX: Acute poliomyelitis, unspecified: A80.9

## 2015-05-17 HISTORY — DX: Sleep apnea, unspecified: G47.30

## 2015-05-17 HISTORY — DX: Anxiety disorder, unspecified: F41.9

## 2015-05-17 HISTORY — DX: Lateral epicondylitis, unspecified elbow: M77.10

## 2015-05-17 HISTORY — PX: COLONOSCOPY WITH PROPOFOL: SHX5780

## 2015-05-17 LAB — GLUCOSE, CAPILLARY: GLUCOSE-CAPILLARY: 123 mg/dL — AB (ref 65–99)

## 2015-05-17 SURGERY — COLONOSCOPY WITH PROPOFOL
Anesthesia: General

## 2015-05-17 MED ORDER — SODIUM CHLORIDE 0.9 % IV SOLN
INTRAVENOUS | Status: DC
Start: 2015-05-17 — End: 2015-05-17
  Administered 2015-05-17: 1000 mL via INTRAVENOUS
  Administered 2015-05-17: 08:00:00 via INTRAVENOUS

## 2015-05-17 MED ORDER — SODIUM CHLORIDE 0.9 % IV SOLN: INTRAVENOUS | Status: DC

## 2015-05-17 MED ORDER — PROPOFOL 500 MG/50ML IV EMUL
INTRAVENOUS | Status: DC | PRN
  Administered 2015-05-17: 120 ug/kg/min via INTRAVENOUS

## 2015-05-17 MED ORDER — FENTANYL CITRATE (PF) 100 MCG/2ML IJ SOLN
INTRAMUSCULAR | Status: DC | PRN
  Administered 2015-05-17: 50 ug via INTRAVENOUS

## 2015-05-17 MED ORDER — PROPOFOL 10 MG/ML IV BOLUS
INTRAVENOUS | Status: DC | PRN
  Administered 2015-05-17: 33.1 mg via INTRAVENOUS

## 2015-05-17 MED ORDER — MIDAZOLAM HCL 5 MG/5ML IJ SOLN
INTRAMUSCULAR | Status: DC | PRN
  Administered 2015-05-17: 1 mg via INTRAVENOUS

## 2015-05-17 NOTE — Transfer of Care (Signed)
Immediate Anesthesia Transfer of Care Note  Patient: Tiffany Hawkins  Procedure(s) Performed: Procedure(s): COLONOSCOPY WITH PROPOFOL (N/A)  Patient Location: PACU  Anesthesia Type:General  Level of Consciousness: sedated  Airway & Oxygen Therapy: Patient Spontanous Breathing  Post-op Assessment: Report given to RN  Post vital signs: Reviewed  Last Vitals:  Filed Vitals:   05/17/15 0652  BP: 132/80  Pulse: 83  Temp: 36.2 C  Resp: 17    Complications: No apparent anesthesia complications

## 2015-05-17 NOTE — Op Note (Signed)
[facility] Gastroenterology Patient Name: Tiffany Hawkins Procedure Date: 05/17/2015 7:27 AM MRN: [id number] Account #: [id number] Date of Birth: [birth year] Admit Type: Outpatient Age: 70 Room: [facility] ENDO ROOM 1 Gender: Female Note Status: Finalized Procedure:         Colonoscopy Indications:       High risk colon cancer surveillance: Personal history of                     colon cancer Providers:         Manya Silvas, MD Referring MD:      Lavera Guise, MD (Referring MD) Medicines:         Propofol per Anesthesia Complications:     No immediate complications. Procedure:         Pre-Anesthesia Assessment:                    - After reviewing the risks and benefits, the patient was                     deemed in satisfactory condition to undergo the procedure.                    After obtaining informed consent, the colonoscope was                     passed under direct vision. Throughout the procedure, the                     patient's blood pressure, pulse, and oxygen saturations                     were monitored continuously. The Colonoscope was                     introduced through the anus and advanced to the the                     ileocolonic anastomosis. The colonoscopy was performed                     without difficulty. The patient tolerated the procedure                     well. The quality of the bowel preparation was good. Findings:      A diminutive polyp was found in the transverse colon. The polyp was       sessile. The polyp was removed with a hot snare. Resection and retrieval       were complete.      Multiple small and large-mouthed diverticula were found in the sigmoid       colon, in the descending colon and in the transverse colon.      Internal hemorrhoids were found during endoscopy. The hemorrhoids were       small and Grade I (internal hemorrhoids that do not prolapse).      The exam was otherwise without  abnormality. Impression:        - One diminutive polyp in the transverse colon. Resected                     and retrieved.                    - Diverticulosis in the sigmoid colon, in the  descending                     colon and in the transverse colon.                    - Internal hemorrhoids.                    - The examination was otherwise normal. Recommendation:    - Await pathology results. Manya Silvas, MD 05/17/2015 8:00:19 AM This report has been signed electronically. Number of Addenda: 0 Note Initiated On: 05/17/2015 7:27 AM Scope Withdrawal Time: 0 hours 15 minutes 49 seconds  Total Procedure Duration: 0 hours 26 minutes 19 seconds       [facility]

## 2015-05-17 NOTE — H&P (Signed)
Primary Care Physician:  Ronnell Freshwater, NP Primary Gastroenterologist:  Dr. Vira Agar  Pre-Procedure History & Physical: HPI:  Tiffany Hawkins is a 70 y.o. female is here for an colonoscopy.   Past Medical History  Diagnosis Date  . Hypertension   . Diabetes mellitus without complication ([place])   . Hyperlipidemia   . Colon cancer ([place]) 10/12/2014    had chemo  . Lateral epicondylitis   . Polio   . Sleep apnea   . Anxiety     Past Surgical History  Procedure Laterality Date  . Appendectomy    . Cholecystectomy    . Tubal ligation    . Colon surgery    . Cervical disc surgery    . Hernia repair    . Port a cath injection ([facility] hx)    . Colonoscopy    . Esophagogastroduodenoscopy    . Back surgery      Prior to Admission medications   Medication Sig Start Date End Date Taking? Authorizing Provider  ondansetron (ZOFRAN-ODT) 4 MG disintegrating tablet Take 4 mg by mouth once.   Yes Historical Provider, MD  polyethylene glycol powder (GLYCOLAX/MIRALAX) powder Take 1 Container by mouth once.   Yes Historical Provider, MD  citalopram (CELEXA) 20 MG tablet Take by mouth.    Historical Provider, MD  Cyanocobalamin 2500 MCG SUBL Place 1,000 mcg under the tongue.     Historical Provider, MD  fenofibrate 54 MG tablet Take by mouth.    Historical Provider, MD  gabapentin (NEURONTIN) 300 MG capsule Take 300 mg by mouth at bedtime.    Historical Provider, MD  insulin aspart (NOVOLOG) 100 UNIT/ML injection Inject 10 Units into the skin daily.    Historical Provider, MD  lidocaine-prilocaine (EMLA) cream Apply 1 application topically as needed. 10/12/14   Evlyn Kanner, NP  Liraglutide (VICTOZA [place]) Inject into the skin.    Historical Provider, MD  lovastatin (MEVACOR) 20 MG tablet Take by mouth.    Historical Provider, MD  Multiple Vitamin (MULTI-VITAMINS) TABS Take by mouth.    Historical Provider, MD  pantoprazole (PROTONIX) 20 MG tablet Take by mouth.    Historical  Provider, MD  Vitamin D, Ergocalciferol, (DRISDOL) 50000 UNITS CAPS capsule Take by mouth.    Historical Provider, MD    Allergies as of 04/14/2015  . (No Known Allergies)    Family History  Problem Relation Age of Onset  . Alzheimer's disease Mother   . Heart disease Father   . Colon cancer Sister   . Breast cancer Cousin   . Breast cancer Cousin     Social History   Social History  . Marital Status: Married    Spouse Name: N/A  . Number of Children: N/A  . Years of Education: N/A   Occupational History  . Not on file.   Social History Main Topics  . Smoking status: Former Research scientist (life sciences)  . Smokeless tobacco: Never Used  . Alcohol Use: No  . Drug Use: No  . Sexual Activity: Not on file   Other Topics Concern  . Not on file   Social History Narrative    Review of Systems: See HPI, otherwise negative ROS  Physical Exam: BP 132/80 mmHg  Pulse 83  Temp(Src) 97.2 F (36.2 C) (Tympanic)  Resp 17  Ht 5' 1.5" (1.562 m)  Wt 66.225 kg (146 lb)  BMI 27.14 kg/m2  SpO2 99% General:   Alert,  pleasant and cooperative in NAD Head:  Normocephalic and  atraumatic. Neck:  Supple; no masses or thyromegaly. Lungs:  Clear throughout to auscultation.    Heart:  Regular rate and rhythm. Abdomen:  Soft, nontender and nondistended. Normal bowel sounds, without guarding, and without rebound.   Neurologic:  Alert and  oriented x4;  grossly normal neurologically.  Impression/Plan: Tiffany Hawkins is here for an colonoscopy to be performed for [facility] colon cancer  Risks, benefits, limitations, and alternatives regarding  colonoscopy have been reviewed with the patient.  Questions have been answered.  All parties agreeable.   Gaylyn Cheers, MD  05/17/2015, 7:25 AM

## 2015-05-17 NOTE — Anesthesia Preprocedure Evaluation (Signed)
Anesthesia Evaluation  Patient identified by MRN, date of birth, ID band Patient awake    Reviewed: Allergy & Precautions, NPO status , Patient's Chart, lab work & pertinent test results  History of Anesthesia Complications Negative for: history of anesthetic complications  Airway Mallampati: I       Dental   Pulmonary neg pulmonary ROS, sleep apnea , former smoker,           Cardiovascular hypertension,      Neuro/Psych    GI/Hepatic negative GI ROS, Neg liver ROS,   Endo/Other  diabetes, Oral Hypoglycemic Agents  Renal/GU negative Renal ROS     Musculoskeletal  (+) Arthritis ,   Abdominal   Peds  Hematology negative hematology ROS (+)   Anesthesia Other Findings   Reproductive/Obstetrics                             Anesthesia Physical Anesthesia Plan  ASA: III  Anesthesia Plan: General   Post-op Pain Management:    Induction: Intravenous  Airway Management Planned: Nasal Cannula  Additional Equipment:   Intra-op Plan:   Post-operative Plan:   Informed Consent: I have reviewed the patients History and Physical, chart, labs and discussed the procedure including the risks, benefits and alternatives for the proposed anesthesia with the patient or authorized representative who has indicated his/her understanding and acceptance.     Plan Discussed with:   Anesthesia Plan Comments:         Anesthesia Quick Evaluation

## 2015-05-17 NOTE — Anesthesia Postprocedure Evaluation (Signed)
Anesthesia Post Note  Patient: Tiffany Hawkins  Procedure(s) Performed: Procedure(s) (LRB): COLONOSCOPY WITH PROPOFOL (N/A)  Patient location during evaluation: Endoscopy Anesthesia Type: General Level of consciousness: awake and alert Pain management: pain level controlled Vital Signs Assessment: post-procedure vital signs reviewed and stable Respiratory status: spontaneous breathing and respiratory function stable Cardiovascular status: stable Anesthetic complications: no    Last Vitals:  Filed Vitals:   05/17/15 0652  BP: 132/80  Pulse: 83  Temp: 36.2 C  Resp: 17    Last Pain: There were no vitals filed for this visit.               KEPHART,WILLIAM K

## 2015-05-18 ENCOUNTER — Encounter: Payer: Self-pay | Admitting: Unknown Physician Specialty

## 2015-05-18 LAB — SURGICAL PATHOLOGY

## 2015-06-07 DIAGNOSIS — E782 Mixed hyperlipidemia: Secondary | ICD-10-CM | POA: Diagnosis not present

## 2015-06-07 DIAGNOSIS — F331 Major depressive disorder, recurrent, moderate: Secondary | ICD-10-CM | POA: Diagnosis not present

## 2015-06-07 DIAGNOSIS — K219 Gastro-esophageal reflux disease without esophagitis: Secondary | ICD-10-CM | POA: Diagnosis not present

## 2015-06-07 DIAGNOSIS — E119 Type 2 diabetes mellitus without complications: Secondary | ICD-10-CM | POA: Diagnosis not present

## 2015-06-07 DIAGNOSIS — I10 Essential (primary) hypertension: Secondary | ICD-10-CM | POA: Diagnosis not present

## 2015-06-07 DIAGNOSIS — Z8500 Personal history of malignant neoplasm of unspecified digestive organ: Secondary | ICD-10-CM | POA: Diagnosis not present

## 2015-06-22 ENCOUNTER — Inpatient Hospital Stay: Payer: Commercial Managed Care - HMO | Attending: Family Medicine

## 2015-06-22 DIAGNOSIS — Z452 Encounter for adjustment and management of vascular access device: Secondary | ICD-10-CM | POA: Insufficient documentation

## 2015-06-22 DIAGNOSIS — Z85038 Personal history of other malignant neoplasm of large intestine: Secondary | ICD-10-CM | POA: Diagnosis not present

## 2015-06-22 DIAGNOSIS — Z95828 Presence of other vascular implants and grafts: Secondary | ICD-10-CM

## 2015-06-22 MED ORDER — HEPARIN SOD (PORK) LOCK FLUSH 100 UNIT/ML IV SOLN
500.0000 [IU] | Freq: Once | INTRAVENOUS | Status: AC
  Administered 2015-06-22: 500 [IU] via INTRAVENOUS

## 2015-06-22 MED ORDER — SODIUM CHLORIDE 0.9% FLUSH
10.0000 mL | INTRAVENOUS | Status: DC | PRN
  Administered 2015-06-22: 10 mL via INTRAVENOUS
  Filled 2015-06-22: qty 10

## 2015-08-03 ENCOUNTER — Inpatient Hospital Stay: Payer: Commercial Managed Care - HMO | Attending: Family Medicine

## 2015-08-03 VITALS — BP 158/98 | HR 90 | Temp 97.4°F | Resp 20

## 2015-08-03 DIAGNOSIS — Z452 Encounter for adjustment and management of vascular access device: Secondary | ICD-10-CM | POA: Insufficient documentation

## 2015-08-03 DIAGNOSIS — Z85038 Personal history of other malignant neoplasm of large intestine: Secondary | ICD-10-CM | POA: Diagnosis not present

## 2015-08-03 DIAGNOSIS — Z95828 Presence of other vascular implants and grafts: Secondary | ICD-10-CM

## 2015-08-03 MED ORDER — SODIUM CHLORIDE 0.9% FLUSH
10.0000 mL | INTRAVENOUS | Status: DC | PRN
  Administered 2015-08-03: 10 mL via INTRAVENOUS
  Filled 2015-08-03: qty 10

## 2015-08-03 MED ORDER — HEPARIN SOD (PORK) LOCK FLUSH 100 UNIT/ML IV SOLN
500.0000 [IU] | Freq: Once | INTRAVENOUS | Status: AC
  Administered 2015-08-03: 500 [IU] via INTRAVENOUS
  Filled 2015-08-03: qty 5

## 2015-08-27 ENCOUNTER — Inpatient Hospital Stay: Payer: Commercial Managed Care - HMO | Attending: Oncology

## 2015-08-27 ENCOUNTER — Inpatient Hospital Stay (HOSPITAL_BASED_OUTPATIENT_CLINIC_OR_DEPARTMENT_OTHER): Payer: Commercial Managed Care - HMO | Admitting: Family Medicine

## 2015-08-27 VITALS — BP 147/92 | HR 92 | Temp 97.6°F | Wt 152.2 lb

## 2015-08-27 DIAGNOSIS — E119 Type 2 diabetes mellitus without complications: Secondary | ICD-10-CM | POA: Insufficient documentation

## 2015-08-27 DIAGNOSIS — Z794 Long term (current) use of insulin: Secondary | ICD-10-CM | POA: Diagnosis not present

## 2015-08-27 DIAGNOSIS — Z800 Family history of malignant neoplasm of digestive organs: Secondary | ICD-10-CM | POA: Insufficient documentation

## 2015-08-27 DIAGNOSIS — Z79899 Other long term (current) drug therapy: Secondary | ICD-10-CM | POA: Insufficient documentation

## 2015-08-27 DIAGNOSIS — F419 Anxiety disorder, unspecified: Secondary | ICD-10-CM | POA: Insufficient documentation

## 2015-08-27 DIAGNOSIS — Z803 Family history of malignant neoplasm of breast: Secondary | ICD-10-CM | POA: Diagnosis not present

## 2015-08-27 DIAGNOSIS — Z923 Personal history of irradiation: Secondary | ICD-10-CM

## 2015-08-27 DIAGNOSIS — Z8612 Personal history of poliomyelitis: Secondary | ICD-10-CM | POA: Insufficient documentation

## 2015-08-27 DIAGNOSIS — Z9221 Personal history of antineoplastic chemotherapy: Secondary | ICD-10-CM

## 2015-08-27 DIAGNOSIS — C189 Malignant neoplasm of colon, unspecified: Secondary | ICD-10-CM

## 2015-08-27 DIAGNOSIS — G4730 Sleep apnea, unspecified: Secondary | ICD-10-CM

## 2015-08-27 DIAGNOSIS — I10 Essential (primary) hypertension: Secondary | ICD-10-CM | POA: Diagnosis not present

## 2015-08-27 DIAGNOSIS — Z85038 Personal history of other malignant neoplasm of large intestine: Secondary | ICD-10-CM | POA: Insufficient documentation

## 2015-08-27 DIAGNOSIS — R300 Dysuria: Secondary | ICD-10-CM

## 2015-08-27 DIAGNOSIS — M7710 Lateral epicondylitis, unspecified elbow: Secondary | ICD-10-CM | POA: Diagnosis not present

## 2015-08-27 DIAGNOSIS — Z87891 Personal history of nicotine dependence: Secondary | ICD-10-CM

## 2015-08-27 DIAGNOSIS — Z452 Encounter for adjustment and management of vascular access device: Secondary | ICD-10-CM | POA: Insufficient documentation

## 2015-08-27 DIAGNOSIS — E785 Hyperlipidemia, unspecified: Secondary | ICD-10-CM | POA: Diagnosis not present

## 2015-08-27 LAB — URINALYSIS COMPLETE WITH MICROSCOPIC (ARMC ONLY)
BACTERIA UA: NONE SEEN
Bilirubin Urine: NEGATIVE
Glucose, UA: NEGATIVE mg/dL
Ketones, ur: NEGATIVE mg/dL
Leukocytes, UA: NEGATIVE
Nitrite: NEGATIVE
PROTEIN: 100 mg/dL — AB
SPECIFIC GRAVITY, URINE: 1.01 (ref 1.005–1.030)
WBC UA: NONE SEEN WBC/hpf (ref 0–5)
pH: 6 (ref 5.0–8.0)

## 2015-08-27 LAB — CBC WITH DIFFERENTIAL/PLATELET
Basophils Absolute: 0 10*3/uL (ref 0–0.1)
Basophils Relative: 1 %
Eosinophils Absolute: 0.3 10*3/uL (ref 0–0.7)
HEMATOCRIT: 47.2 % — AB (ref 35.0–47.0)
Hemoglobin: 15.9 g/dL (ref 12.0–16.0)
LYMPHS ABS: 2.4 10*3/uL (ref 1.0–3.6)
MCH: 28.8 pg (ref 26.0–34.0)
MCHC: 33.7 g/dL (ref 32.0–36.0)
MCV: 85.7 fL (ref 80.0–100.0)
MONO ABS: 0.9 10*3/uL (ref 0.2–0.9)
NEUTROS ABS: 5.6 10*3/uL (ref 1.4–6.5)
Neutrophils Relative %: 60 %
PLATELETS: 185 10*3/uL (ref 150–440)
RBC: 5.51 MIL/uL — ABNORMAL HIGH (ref 3.80–5.20)
RDW: 13.8 % (ref 11.5–14.5)
WBC: 9.3 10*3/uL (ref 3.6–11.0)

## 2015-08-27 NOTE — Progress Notes (Signed)
[facility]  Telephone:([phone] Fax:([phone]) [phone]  ID: Tiffany Hawkins OB: [birth year]  MR#: [id number]  [id number]  Patient Care Team: Ronnell Freshwater, NP as PCP - General (Family Medicine)  CHIEF COMPLAINT:  Chief Complaint  Patient presents with  . Colon Cancer    INTERVAL HISTORY: Patient returns to clinic today for repeat laboratory work and further evaluation. She doesn't feel well today but states she is under a lot of stress and she thinks that is the problem. She is having some pain on urination so she thinks she has a UTI. She has no neurologic complaints. She denies any recent fevers. She has good appetite and denies weight loss. She denies any chest pain or shortness of breath. She has no abdominal pain and is noted no changes in her bowel movements. She denies any nausea, vomiting, constipation, diarrhea. She has no melena or hematochezia. Patient feels at her baseline and offers no specific complaints today.  REVIEW OF SYSTEMS:   Review of Systems  Constitutional: Negative.   Respiratory: Negative.   Cardiovascular: Negative.   Gastrointestinal: Negative.  Negative for blood in stool and melena.  Genitourinary: Positive for dysuria.  Musculoskeletal: Negative.   Neurological: Negative.     As per HPI. Otherwise, a complete review of systems is negatve.  PAST MEDICAL HISTORY: Past Medical History  Diagnosis Date  . Hypertension   . Diabetes mellitus without complication ([place])   . Hyperlipidemia   . Colon cancer ([place]) 10/12/2014    had chemo  . Lateral epicondylitis   . Polio   . Sleep apnea   . Anxiety     PAST SURGICAL HISTORY: Past Surgical History  Procedure Laterality Date  . Appendectomy    . Cholecystectomy    . Tubal ligation    . Colon surgery    . Cervical disc surgery    . Hernia repair    . Port a cath injection ([facility] hx)    . Colonoscopy    . Esophagogastroduodenoscopy    . Back surgery    .  Colonoscopy with propofol N/A 05/17/2015    Procedure: COLONOSCOPY WITH PROPOFOL;  Surgeon: Manya Silvas, MD;  Location: [facility] ENDOSCOPY;  Service: Endoscopy;  Laterality: N/A;    FAMILY HISTORY Family History  Problem Relation Age of Onset  . Alzheimer's disease Mother   . Heart disease Father   . Colon cancer Sister   . Breast cancer Cousin   . Breast cancer Cousin        ADVANCED DIRECTIVES:    HEALTH MAINTENANCE: Social History  Substance Use Topics  . Smoking status: Former Research scientist (life sciences)  . Smokeless tobacco: Never Used  . Alcohol Use: No    No Known Allergies  Current Outpatient Prescriptions  Medication Sig Dispense Refill  . citalopram (CELEXA) 20 MG tablet Take by mouth.    . Cyanocobalamin 2500 MCG SUBL Place 1,000 mcg under the tongue.     . fenofibrate 54 MG tablet Take by mouth.    . gabapentin (NEURONTIN) 300 MG capsule Take 300 mg by mouth at bedtime. Reported on 08/27/2015    . insulin aspart (NOVOLOG) 100 UNIT/ML injection Inject 10 Units into the skin daily.    Marland Kitchen lidocaine-prilocaine (EMLA) cream Apply 1 application topically as needed. 30 g 0  . Liraglutide (VICTOZA Sunflower) Inject into the skin.    Marland Kitchen lovastatin (MEVACOR) 20 MG tablet Take by mouth.    . Multiple Vitamin (MULTI-VITAMINS) TABS Take by mouth.    Marland Kitchen  pantoprazole (PROTONIX) 20 MG tablet Take by mouth.    . Vitamin D, Ergocalciferol, (DRISDOL) 50000 UNITS CAPS capsule Take by mouth.    . ondansetron (ZOFRAN-ODT) 4 MG disintegrating tablet Take 4 mg by mouth once. Reported on 08/27/2015    . polyethylene glycol powder (GLYCOLAX/MIRALAX) powder Take 1 Container by mouth once. Reported on 08/27/2015     No current facility-administered medications for this visit.    OBJECTIVE: Filed Vitals:   08/27/15 1121  BP: 147/92  Pulse: 92  Temp: 97.6 F (36.4 C)     Body mass index is 28.3 kg/(m^2).    ECOG FS:0 - Asymptomatic  General: Well-developed, well-nourished, no acute distress. Eyes: Pink  conjunctiva, anicteric sclera. Lungs: Clear to auscultation bilaterally. Heart: Regular rate and rhythm. No rubs, murmurs, or gallops. Abdomen: Soft, nontender, nondistended. No organomegaly noted, normoactive bowel sounds. Musculoskeletal: No edema, cyanosis, or clubbing. Neuro: Alert, answering all questions appropriately. Cranial nerves grossly intact. Skin: No rashes or petechiae noted. Psych: Normal affect.   LAB RESULTS:  Lab Results  Component Value Date   NA 139 02/26/2015   K 3.9 02/26/2015   CL 104 02/26/2015   CO2 27 02/26/2015   GLUCOSE 153* 02/26/2015   BUN 15 02/26/2015   CREATININE 0.95 02/26/2015   CALCIUM 8.8* 02/26/2015   PROT 7.2 02/26/2015   ALBUMIN 4.1 02/26/2015   AST 24 02/26/2015   ALT 34 02/26/2015   ALKPHOS 42 02/26/2015   BILITOT 0.3 02/26/2015   GFRNONAA 59* 02/26/2015   GFRAA >60 02/26/2015    Lab Results  Component Value Date   WBC 9.3 08/27/2015   NEUTROABS 5.6 08/27/2015   HGB 15.9 08/27/2015   HCT 47.2* 08/27/2015   MCV 85.7 08/27/2015   PLT 185 08/27/2015     STUDIES: No results found.  ASSESSMENT: Stage IIB adenocarcinoma of the colon  PLAN:    1. Colon cancer: Although patient was stage IIB, she was noted to have positive margins on her surgical resection therefore she underwent adjuvant chemotherapy and radiation therapy which she completed in March 2014. She had a CT scan completed March 2016 which revealed no evidence of disease. Her CEA is 2.1 and pending for today. She had a colonoscopy on May 17, 2015 which was negative for malignancy. Her CBC today is WNL. Patient will return to clinic in 6 months with labs. 2. Dysuria: Urinalysis today is negative for UTI.   Patient expressed understanding and was in agreement with this plan. She also understands that She can call clinic at any time with any questions, concerns, or complaints.  Dr. Rogue Bussing was available for consultation and review of plan of care for this  patient.  Colon cancer [facility])   Staging form: Colon and Rectum, AJCC 7th Edition     Clinical stage from 10/12/2011: Stage IIB (T4a, N0, M0) - Signed by Evlyn Kanner, NP on 10/12/2014   Evlyn Kanner, NP   08/27/2015 12:37 PM

## 2015-08-27 NOTE — Progress Notes (Signed)
Patient ambulates without assistance, brought to exam room 3.  Patient denies pain or discomfort, but states she feels as if she has a UTI.  Vitals documented, medication record updated, information provided by patient

## 2015-08-30 LAB — CEA: CEA: 2.3 ng/mL (ref 0.0–4.7)

## 2015-09-09 DIAGNOSIS — M545 Low back pain: Secondary | ICD-10-CM | POA: Diagnosis not present

## 2015-09-09 DIAGNOSIS — E1165 Type 2 diabetes mellitus with hyperglycemia: Secondary | ICD-10-CM | POA: Diagnosis not present

## 2015-09-09 DIAGNOSIS — F3341 Major depressive disorder, recurrent, in partial remission: Secondary | ICD-10-CM | POA: Diagnosis not present

## 2015-09-09 DIAGNOSIS — Z8500 Personal history of malignant neoplasm of unspecified digestive organ: Secondary | ICD-10-CM | POA: Diagnosis not present

## 2015-09-09 DIAGNOSIS — I10 Essential (primary) hypertension: Secondary | ICD-10-CM | POA: Diagnosis not present

## 2015-09-09 DIAGNOSIS — E782 Mixed hyperlipidemia: Secondary | ICD-10-CM | POA: Diagnosis not present

## 2015-09-14 ENCOUNTER — Inpatient Hospital Stay: Payer: Commercial Managed Care - HMO

## 2015-09-14 VITALS — Resp 20

## 2015-09-14 DIAGNOSIS — I10 Essential (primary) hypertension: Secondary | ICD-10-CM | POA: Diagnosis not present

## 2015-09-14 DIAGNOSIS — G4730 Sleep apnea, unspecified: Secondary | ICD-10-CM | POA: Diagnosis not present

## 2015-09-14 DIAGNOSIS — Z95828 Presence of other vascular implants and grafts: Secondary | ICD-10-CM

## 2015-09-14 DIAGNOSIS — Z452 Encounter for adjustment and management of vascular access device: Secondary | ICD-10-CM | POA: Diagnosis not present

## 2015-09-14 DIAGNOSIS — F419 Anxiety disorder, unspecified: Secondary | ICD-10-CM | POA: Diagnosis not present

## 2015-09-14 DIAGNOSIS — Z85038 Personal history of other malignant neoplasm of large intestine: Secondary | ICD-10-CM | POA: Diagnosis not present

## 2015-09-14 DIAGNOSIS — M7710 Lateral epicondylitis, unspecified elbow: Secondary | ICD-10-CM | POA: Diagnosis not present

## 2015-09-14 DIAGNOSIS — E119 Type 2 diabetes mellitus without complications: Secondary | ICD-10-CM | POA: Diagnosis not present

## 2015-09-14 DIAGNOSIS — R300 Dysuria: Secondary | ICD-10-CM | POA: Diagnosis not present

## 2015-09-14 DIAGNOSIS — E785 Hyperlipidemia, unspecified: Secondary | ICD-10-CM | POA: Diagnosis not present

## 2015-09-14 MED ORDER — HEPARIN SOD (PORK) LOCK FLUSH 100 UNIT/ML IV SOLN
500.0000 [IU] | Freq: Once | INTRAVENOUS | Status: AC
  Administered 2015-09-14: 500 [IU] via INTRAVENOUS
  Filled 2015-09-14: qty 5

## 2015-09-14 MED ORDER — SODIUM CHLORIDE 0.9% FLUSH
10.0000 mL | INTRAVENOUS | Status: DC | PRN
  Administered 2015-09-14: 10 mL via INTRAVENOUS
  Filled 2015-09-14: qty 10

## 2015-09-21 ENCOUNTER — Other Ambulatory Visit
Admission: RE | Admit: 2015-09-21 | Discharge: 2015-09-21 | Disposition: A | Discharge status: Home / Self Care | Payer: Commercial Managed Care - HMO | Source: Ambulatory Visit | Attending: Nurse Practitioner | Admitting: Nurse Practitioner

## 2015-09-21 DIAGNOSIS — I10 Essential (primary) hypertension: Secondary | ICD-10-CM | POA: Insufficient documentation

## 2015-09-21 DIAGNOSIS — E559 Vitamin D deficiency, unspecified: Secondary | ICD-10-CM | POA: Insufficient documentation

## 2015-09-21 DIAGNOSIS — E119 Type 2 diabetes mellitus without complications: Secondary | ICD-10-CM | POA: Insufficient documentation

## 2015-09-21 LAB — CBC
HEMATOCRIT: 46.7 % (ref 35.0–47.0)
Hemoglobin: 15.4 g/dL (ref 12.0–16.0)
MCH: 28.6 pg (ref 26.0–34.0)
MCHC: 32.9 g/dL (ref 32.0–36.0)
MCV: 86.8 fL (ref 80.0–100.0)
Platelets: 186 10*3/uL (ref 150–440)
RBC: 5.38 MIL/uL — ABNORMAL HIGH (ref 3.80–5.20)
RDW: 13.5 % (ref 11.5–14.5)
WBC: 7.2 10*3/uL (ref 3.6–11.0)

## 2015-09-21 LAB — LIPID PANEL
CHOL/HDL RATIO: 3.6 ratio
CHOLESTEROL: 135 mg/dL (ref 0–200)
HDL: 38 mg/dL — AB (ref >40)
LDL Cholesterol: 60 mg/dL (ref 0–99)
Triglycerides: 184 mg/dL — ABNORMAL HIGH (ref <150)
VLDL: 37 mg/dL (ref 0–40)

## 2015-09-21 LAB — COMPREHENSIVE METABOLIC PANEL
ALBUMIN: 4.6 g/dL (ref 3.5–5.0)
ALT: 37 U/L (ref 14–54)
ANION GAP: 8 (ref 5–15)
AST: 28 U/L (ref 15–41)
Alkaline Phosphatase: 48 U/L (ref 38–126)
BILIRUBIN TOTAL: 0.5 mg/dL (ref 0.3–1.2)
BUN: 18 mg/dL (ref 6–20)
CO2: 27 mmol/L (ref 22–32)
Calcium: 9.2 mg/dL (ref 8.9–10.3)
Chloride: 104 mmol/L (ref 101–111)
Creatinine, Ser: 0.88 mg/dL (ref 0.44–1.00)
GFR calc Af Amer: >60 mL/min (ref >60)
GFR calc non Af Amer: >60 mL/min (ref >60)
GLUCOSE: 133 mg/dL — AB (ref 65–99)
POTASSIUM: 4.2 mmol/L (ref 3.5–5.1)
Sodium: 139 mmol/L (ref 135–145)
TOTAL PROTEIN: 7.5 g/dL (ref 6.5–8.1)

## 2015-09-21 LAB — T4, FREE: Free T4: 0.84 ng/dL (ref 0.61–1.12)

## 2015-09-21 LAB — TSH: TSH: 1.83 u[IU]/mL (ref 0.350–4.500)

## 2015-09-22 LAB — VITAMIN D 25 HYDROXY (VIT D DEFICIENCY, FRACTURES): VIT D 25 HYDROXY: 53.5 ng/mL (ref 30.0–100.0)

## 2015-09-22 LAB — MICROALBUMIN / CREATININE URINE RATIO
Creatinine, Urine: 64.1 mg/dL
MICROALB UR: 113.3 ug/mL — AB
Microalb Creat Ratio: 176.8 mg/g creat — ABNORMAL HIGH (ref 0.0–30.0)

## 2015-10-05 DIAGNOSIS — Z1283 Encounter for screening for malignant neoplasm of skin: Secondary | ICD-10-CM | POA: Diagnosis not present

## 2015-10-05 DIAGNOSIS — L720 Epidermal cyst: Secondary | ICD-10-CM | POA: Diagnosis not present

## 2015-10-05 DIAGNOSIS — D229 Melanocytic nevi, unspecified: Secondary | ICD-10-CM | POA: Diagnosis not present

## 2015-10-05 DIAGNOSIS — L821 Other seborrheic keratosis: Secondary | ICD-10-CM | POA: Diagnosis not present

## 2015-10-26 ENCOUNTER — Inpatient Hospital Stay: Payer: Commercial Managed Care - HMO | Attending: Family Medicine

## 2015-10-26 DIAGNOSIS — Z85038 Personal history of other malignant neoplasm of large intestine: Secondary | ICD-10-CM | POA: Diagnosis not present

## 2015-10-26 DIAGNOSIS — Z452 Encounter for adjustment and management of vascular access device: Secondary | ICD-10-CM | POA: Insufficient documentation

## 2015-10-26 DIAGNOSIS — Z95828 Presence of other vascular implants and grafts: Secondary | ICD-10-CM

## 2015-10-26 MED ORDER — HEPARIN SOD (PORK) LOCK FLUSH 100 UNIT/ML IV SOLN
500.0000 [IU] | Freq: Once | INTRAVENOUS | Status: AC
  Administered 2015-10-26: 500 [IU] via INTRAVENOUS

## 2015-10-26 MED ORDER — SODIUM CHLORIDE 0.9% FLUSH
10.0000 mL | INTRAVENOUS | Status: DC | PRN
  Administered 2015-10-26: 10 mL via INTRAVENOUS
  Filled 2015-10-26: qty 10

## 2015-10-26 MED ORDER — HEPARIN SOD (PORK) LOCK FLUSH 100 UNIT/ML IV SOLN
INTRAVENOUS | Status: AC
  Filled 2015-10-26: qty 5

## 2015-11-01 DIAGNOSIS — E1165 Type 2 diabetes mellitus with hyperglycemia: Secondary | ICD-10-CM | POA: Diagnosis not present

## 2015-11-01 DIAGNOSIS — I10 Essential (primary) hypertension: Secondary | ICD-10-CM | POA: Diagnosis not present

## 2015-11-01 DIAGNOSIS — E782 Mixed hyperlipidemia: Secondary | ICD-10-CM | POA: Diagnosis not present

## 2015-11-01 DIAGNOSIS — F3341 Major depressive disorder, recurrent, in partial remission: Secondary | ICD-10-CM | POA: Diagnosis not present

## 2015-11-01 DIAGNOSIS — F411 Generalized anxiety disorder: Secondary | ICD-10-CM | POA: Diagnosis not present

## 2015-11-01 DIAGNOSIS — Z9114 Patient's other noncompliance with medication regimen: Secondary | ICD-10-CM | POA: Diagnosis not present

## 2015-11-15 ENCOUNTER — Encounter: Payer: Self-pay | Admitting: Physical Therapy

## 2015-11-15 ENCOUNTER — Ambulatory Visit: Payer: Commercial Managed Care - HMO | Attending: Nurse Practitioner | Admitting: Physical Therapy

## 2015-11-15 DIAGNOSIS — Z0001 Encounter for general adult medical examination with abnormal findings: Secondary | ICD-10-CM | POA: Diagnosis not present

## 2015-11-15 DIAGNOSIS — E782 Mixed hyperlipidemia: Secondary | ICD-10-CM | POA: Diagnosis not present

## 2015-11-15 DIAGNOSIS — M6281 Muscle weakness (generalized): Secondary | ICD-10-CM | POA: Diagnosis not present

## 2015-11-15 DIAGNOSIS — R2689 Other abnormalities of gait and mobility: Secondary | ICD-10-CM

## 2015-11-15 DIAGNOSIS — E1165 Type 2 diabetes mellitus with hyperglycemia: Secondary | ICD-10-CM | POA: Diagnosis not present

## 2015-11-15 DIAGNOSIS — I10 Essential (primary) hypertension: Secondary | ICD-10-CM | POA: Diagnosis not present

## 2015-11-15 DIAGNOSIS — R262 Difficulty in walking, not elsewhere classified: Secondary | ICD-10-CM | POA: Diagnosis not present

## 2015-11-15 NOTE — Therapy (Signed)
[facility] [address] [place], [place], [address] Phone: [phone]   Fax:  [phone]  Physical Therapy Evaluation  Patient Details  Name: Tiffany Hawkins MRN: [id number] Date of Birth: 12-05-1945 Referring Provider: Leretha Pol  Encounter Date: 11/15/2015      PT End of Session - 11/15/15 1612    Visit Number 1   Number of Visits 8   Date for PT Re-Evaluation 12/13/15   Authorization - Visit Number 1   Authorization - Number of Visits 10   PT Start Time [id number]   PT Stop Time [id number]   PT Time Calculation (min) 55 min   Equipment Utilized During Treatment Gait belt   Activity Tolerance Patient tolerated treatment well;No increased pain;Patient limited by fatigue   Behavior During Therapy [facility] for tasks assessed/performed      Past Medical History:  Diagnosis Date  . Anxiety   . Colon cancer ([place]) 10/12/2014   had chemo  . Diabetes mellitus without complication ([place])   . Hyperlipidemia   . Hypertension   . Lateral epicondylitis   . Polio   . Sleep apnea     Past Surgical History:  Procedure Laterality Date  . APPENDECTOMY    . BACK SURGERY    . CERVICAL DISC SURGERY    . CHOLECYSTECTOMY    . COLON SURGERY    . COLONOSCOPY    . COLONOSCOPY WITH PROPOFOL N/A 05/17/2015   Procedure: COLONOSCOPY WITH PROPOFOL;  Surgeon: Manya Silvas, MD;  Location: [facility] ENDOSCOPY;  Service: Endoscopy;  Laterality: N/A;  . ESOPHAGOGASTRODUODENOSCOPY    . HERNIA REPAIR    . PORT A CATH INJECTION ([place] HX)    . TUBAL LIGATION      There were no vitals filed for this visit.       Subjective Assessment - 11/15/15 1732    Subjective Pt c/o of history of falling with 3 falls reported in the last week with no recent report of serious injury. She does not ambulate with an assistive device despite recommendation by PT and states that she feels using a cane is more problematic than it is helpful. She has access to RW and  rollator but prefers not to use them. Pt states she has a hx of LLE weakness.   Pertinent History Frequent falls; typically posterior   Limitations Walking;House hold activities   How long can you walk comfortably? 30 minutes   Patient Stated Goals pt would like to be able to amb safely, reduce falls, and be able to care for her great grandchildren   Currently in Pain? No/denies      Objective:  Neuro/Gait: Pt ambulates without assistive device in clinic 5 minutes. Amb with SPC in clinic with emphasis on reciprocal gait pattern using RUE. Pt demonstrates unsafe gait pattern with SPC secondary to coordination deficits with several near LOB/tripping hazard. Pt amb with rollator with improved stability and no near LOB. Emphasis on transfer safety, posture and gait pattern.  Pt response for medical necessity: Pt demonstrates deficits in LLE strength with accompanying balance deficits. She will benefit from skilled PT to address deficits, educate on correct assistive device selection and use and progression towards safe indep ambulation strategy.       PT Long Term Goals - 11/15/15 1742      PT LONG TERM GOAL #1   Title Pt will improve MMT scores by 1/2 grade to promote functional strength gains   Baseline Strength: R/L  hip flexion 4+/4, knee extension 4+/4-, knee flexion 4+/3+, dorsiflexion 5/4, plantarflexion 5/4-.   Time 4   Period Weeks   Status New     PT LONG TERM GOAL #2   Title Pt will improve BERG score by 10 pts to promote safety with ambulation and functional tasks.   Baseline BERG November 19, 2022: 36/56   Time 4   Period Weeks   Status New     PT LONG TERM GOAL #3   Title Pt will be able to perform tandem stance with LLE posterior for >30 sec to promote balance gains and safety with dynamic standing/reaching tasks   Baseline pt able to balance <5 sec with LLE posterior   Time 4   Period Weeks   Status New     PT LONG TERM GOAL #4   Title Pt will demonstrates safety with  ambulation using least restrictive AD with no LOB with 10 minutes of overground walking.    Baseline pt with unsteady independent gait   Time 4   Period Weeks               Plan - 2015/11/19 1613    Clinical Impression Statement Pt is a pleasant 70 year old female referred to physical therapy for history of recurrent falls, unsteady gait and LLE weakness. Strength: R/L hip flexion 4+/4, knee extension 4+/4-, knee flexion 4+/3+, dorsiflexion 5/4, plantarflexion 5/4-. Pt ambulates with unsteady gait pattern with tendency for hip ER of LLE and decreased stance time on LLE with preference for slower cadence due to feelings of unsteadiness and early onset of LLE fatigue. Pt demonstrates ability to ambulate with increased speed, stability and decreased compensation and onset of fatigue when using rollator. Pt with poor ambulation strategy when using SPC due to coordination deficits. Outcome Measures: BERG 36, 5XSTS 20sec.    Rehab Potential Fair   PT Frequency 2x / week   PT Duration 4 weeks   PT Treatment/Interventions ADLs/Self Care Home Management;Aquatic Therapy;Neuromuscular re-education;Balance training;Therapeutic exercise;Therapeutic activities;Functional mobility training;Stair training;Gait training;DME Instruction;Patient/family education;Manual techniques;Energy conservation   PT Next Visit Plan Issue HEP/ reassess and discuss need for assistive device   Consulted and Agree with Plan of Care Patient      Patient will benefit from skilled therapeutic intervention in order to improve the following deficits and impairments:  Abnormal gait, Decreased endurance, Decreased activity tolerance, Decreased strength, Pain, Difficulty walking, Decreased mobility, Decreased balance, Decreased range of motion, Improper body mechanics, Postural dysfunction, Decreased safety awareness  Visit Diagnosis: Muscle weakness  Difficulty in walking, not elsewhere classified  Abnormality of gait due to  impairment of balance      G-Codes - November 19, 2015 1615    Functional Assessment Tool Used Berg balance/ clinical impression/ muscle weakness/ gait difficulty.     Functional Limitation Mobility: Walking and moving around   Mobility: Walking and Moving Around Current Status [phone]) At least 40 percent but less than 60 percent impaired, limited or restricted   Mobility: Walking and Moving Around Goal Status [phone]) At least 1 percent but less than 20 percent impaired, limited or restricted       Problem List Patient Active Problem List   Diagnosis Date Noted  . Colon cancer ([place]) 10/12/2014   Pura Spice, PT, DPT # [phone] Derrill Memo, SPT 11/19/2015, 5:59 PM  [place] [facility] [address] [place], [place], [address] Phone: [phone]   Fax:  [phone]  Name: Tiffany Hawkins MRN: [id number] Date  of Birth: 19-Aug-1945

## 2015-11-17 ENCOUNTER — Ambulatory Visit: Payer: Commercial Managed Care - HMO | Attending: Nurse Practitioner | Admitting: Physical Therapy

## 2015-11-17 DIAGNOSIS — R2689 Other abnormalities of gait and mobility: Secondary | ICD-10-CM | POA: Diagnosis not present

## 2015-11-17 DIAGNOSIS — R262 Difficulty in walking, not elsewhere classified: Secondary | ICD-10-CM | POA: Diagnosis not present

## 2015-11-17 DIAGNOSIS — M6281 Muscle weakness (generalized): Secondary | ICD-10-CM

## 2015-11-18 ENCOUNTER — Encounter: Payer: Self-pay | Admitting: Physical Therapy

## 2015-11-18 NOTE — Therapy (Signed)
[facility] [address] [place], [place], [address] Phone: [phone]   Fax:  [phone]  Physical Therapy Treatment  Patient Details  Name: Tiffany Hawkins MRN: [id number] Date of Birth: 25-Jun-1945 Referring Provider: Leretha Pol  Encounter Date: 11/17/2015      PT End of Session - 11/18/15 1941    Visit Number 2   Number of Visits 8   Date for PT Re-Evaluation 12/13/15   Authorization - Visit Number 2   Authorization - Number of Visits 10   PT Start Time 1311   PT Stop Time 1410   PT Time Calculation (min) 59 min   Equipment Utilized During Treatment Gait belt   Activity Tolerance Patient tolerated treatment well;No increased pain;Patient limited by fatigue   Behavior During Therapy [facility] for tasks assessed/performed      Past Medical History:  Diagnosis Date  . Anxiety   . Colon cancer ([place]) 10/12/2014   had chemo  . Diabetes mellitus without complication ([place])   . Hyperlipidemia   . Hypertension   . Lateral epicondylitis   . Polio   . Sleep apnea     Past Surgical History:  Procedure Laterality Date  . APPENDECTOMY    . BACK SURGERY    . CERVICAL DISC SURGERY    . CHOLECYSTECTOMY    . COLON SURGERY    . COLONOSCOPY    . COLONOSCOPY WITH PROPOFOL N/A 05/17/2015   Procedure: COLONOSCOPY WITH PROPOFOL;  Surgeon: Manya Silvas, MD;  Location: [facility] ENDOSCOPY;  Service: Endoscopy;  Laterality: N/A;  . ESOPHAGOGASTRODUODENOSCOPY    . HERNIA REPAIR    . PORT A CATH INJECTION ([place] HX)    . TUBAL LIGATION      There were no vitals filed for this visit.      Subjective Assessment - 11/18/15 1943    Subjective Pt. states she is doing well and entered PT with use of SPC.  Pt. entered PT with a staggering gait pattern.     Pertinent History Frequent falls; typically posterior   Limitations Walking;House hold activities   How long can you walk comfortably? 30 minutes   Patient Stated Goals pt would  like to be able to amb safely, reduce falls, and be able to care for her great grandchildren   Currently in Pain? No/denies       OBJECTIVE:  There.ex: Standing hip ex. 2# ankle wt. (flexion/ abd./ knee flexion/ heel raises/ walking in //-bars/ seated LAQ/ marching 30x each.  Scifit L6 10 min. B UE/LE.  Discussed HEP.    Neuro. Mm: Step ups forward and lateral on 6" step.  Tandem stance/ gait.  Lateral walking in //-bars with no UE assist 5x each (cuing for posture).    Gait training:  Amb. In clinic with use of rollator and instruction in proper technique/ step pattern.  Amb. With use of SPC and 2-point gait pattern progressing to ambulation in parking lot/ grass.  Pt. Requires CGA for safety/ verbal cuing with SPC.      Pt response for medical necessity:  Pt. Will benefit from skilled PT services to increase B LE muscle to improve balance/ safety with ambulating (determining proper assistive device).         PT Long Term Goals - 11/15/15 1742      PT LONG TERM GOAL #1   Title Pt will improve MMT scores by 1/2 grade to promote functional strength gains   Baseline Strength: R/L  hip flexion 4+/4, knee extension 4+/4-, knee flexion 4+/3+, dorsiflexion 5/4, plantarflexion 5/4-.   Time 4   Period Weeks   Status New     PT LONG TERM GOAL #2   Title Pt will improve BERG score by 10 pts to promote safety with ambulation and functional tasks.   Baseline BERG 7/31: 36/56   Time 4   Period Weeks   Status New     PT LONG TERM GOAL #3   Title Pt will be able to perform tandem stance with LLE posterior for >30 sec to promote balance gains and safety with dynamic standing/reaching tasks   Baseline pt able to balance <5 sec with LLE posterior   Time 4   Period Weeks   Status New     PT LONG TERM GOAL #4   Title Pt will demonstrates safety with ambulation using least restrictive AD with no LOB with 10 minutes of overground walking.    Baseline pt with unsteady independent gait   Time 4    Period Weeks            Plan - 11/18/15 1945    Clinical Impression Statement Pt. ambulates with a staggering/ uncertain gait pattern in clinic with several episodes of a misstep/ scissoring gait requiring UE assist to prevent falls.  Pt. not interested in ambulating with use of rollator eventhough she ambulates with marked improvement, as compared to [facility].  Pt. requires SBA/CGA for safety with all standing/ walking/ ther.ex in clinic.     Rehab Potential Fair   PT Frequency 2x / week   PT Duration 4 weeks   PT Treatment/Interventions ADLs/Self Care Home Management;Aquatic Therapy;Neuromuscular re-education;Balance training;Therapeutic exercise;Therapeutic activities;Functional mobility training;Stair training;Gait training;DME Instruction;Patient/family education;Manual techniques;Energy conservation   PT Next Visit Plan Issue HEP/ reassess and discuss need for assistive device   Consulted and Agree with Plan of Care Patient      Patient will benefit from skilled therapeutic intervention in order to improve the following deficits and impairments:  Abnormal gait, Decreased endurance, Decreased activity tolerance, Decreased strength, Pain, Difficulty walking, Decreased mobility, Decreased balance, Decreased range of motion, Improper body mechanics, Postural dysfunction, Decreased safety awareness  Visit Diagnosis: Muscle weakness  Difficulty in walking, not elsewhere classified  Abnormality of gait due to impairment of balance     Problem List Patient Active Problem List   Diagnosis Date Noted  . Colon cancer ([place]) 10/12/2014   Pura Spice, PT, DPT # [phone]  11/18/2015, 7:50 PM  [place] [facility] [address] [place], [place], [address] Phone: [phone]   Fax:  [phone]  Name: Tiffany Hawkins MRN: [id number] Date of Birth: 1945/11/26

## 2015-11-22 ENCOUNTER — Encounter: Payer: Self-pay | Admitting: Physical Therapy

## 2015-11-22 ENCOUNTER — Ambulatory Visit: Payer: Commercial Managed Care - HMO | Admitting: Physical Therapy

## 2015-11-22 DIAGNOSIS — M6281 Muscle weakness (generalized): Secondary | ICD-10-CM

## 2015-11-22 DIAGNOSIS — R2689 Other abnormalities of gait and mobility: Secondary | ICD-10-CM | POA: Diagnosis not present

## 2015-11-22 DIAGNOSIS — R262 Difficulty in walking, not elsewhere classified: Secondary | ICD-10-CM | POA: Diagnosis not present

## 2015-11-22 NOTE — Therapy (Signed)
[facility] [address] [place], [place], [address] Phone: [phone]   Fax:  [phone]  Physical Therapy Treatment  Patient Details  Name: Tiffany Hawkins MRN: [id number] Date of Birth: May 28, 1945 Referring Provider: Leretha Pol  Encounter Date: 11/22/2015      PT End of Session - 11/22/15 1534    Visit Number 3   Number of Visits 8   Date for PT Re-Evaluation 12/13/15   Authorization - Visit Number 3   Authorization - Number of Visits 10   PT Start Time [id number]   PT Stop Time 1350   PT Time Calculation (min) 44 min   Equipment Utilized During Treatment Gait belt   Activity Tolerance Patient tolerated treatment well;No increased pain;Patient limited by fatigue   Behavior During Therapy [facility] for tasks assessed/performed      Past Medical History:  Diagnosis Date  . Anxiety   . Colon cancer ([place]) 10/12/2014   had chemo  . Diabetes mellitus without complication ([place])   . Hyperlipidemia   . Hypertension   . Lateral epicondylitis   . Polio   . Sleep apnea     Past Surgical History:  Procedure Laterality Date  . APPENDECTOMY    . BACK SURGERY    . CERVICAL DISC SURGERY    . CHOLECYSTECTOMY    . COLON SURGERY    . COLONOSCOPY    . COLONOSCOPY WITH PROPOFOL N/A 05/17/2015   Procedure: COLONOSCOPY WITH PROPOFOL;  Surgeon: Manya Silvas, MD;  Location: [facility] ENDOSCOPY;  Service: Endoscopy;  Laterality: N/A;  . ESOPHAGOGASTRODUODENOSCOPY    . HERNIA REPAIR    . PORT A CATH INJECTION ([place] [facility])    . TUBAL LIGATION      There were no vitals filed for this visit.      Subjective Assessment - 11/22/15 1532    Subjective Pt reports that she has "been good" and has not fallen since last PT visit. States that she felt mild muscle soreness following last session. Enters PT clinic with no assistive device.   Pertinent History Frequent falls; typically posterior   Limitations Walking;House hold activities   How  long can you walk comfortably? 30 minutes   Patient Stated Goals pt would like to be able to amb safely, reduce falls, and be able to care for her great grandchildren   Currently in Pain? No/denies      Objective:  Therapeutic Exercise: In // bars : Step on on 6'' step 2x15 with UE support prn; pt able to progress to no UE support but requiring CGA for safety. Seated knee extension RLE 2.5#, LLE 1.5# 2x20. Wall squat with towel 2x8 with pt c/o fatigue in anterior thigh. Heel raises 2x20. Resisted plantarflexion with red theraband 1x10 on LLE. Resisted dorsiflexion with red theraband 1x10 on LLE.  Gait training: Amb in clinic with rollator focusing on heel strike and postural stability. Pt demonstrates overall improved gait mechanics and stability as compared to indep amb. States that she feels more secure with rollator than with SPC. C/o of early onset of fatigue with ambulation, requiring seated rest break after approx 6 minutes.  Neuromuscular Re-ed: in // bars: static balance trials on air ex pad 5x1 minute. Cone taps with BLE and UE prn (pt alternating R/L) 3x15 with bilateral LE serving as stance leg. Step up on air ex pad with LLE as stance leg x30.   Pt response for medical necessity: Pt demonstrates deficits in balance, LE  strength and overall endurance. She will benefit from skilled PT program to promote safe ambulation strategy and improve overall stability and strength of LE.       PT Education - 11/22/15 1534    Education provided Yes   Education Details see HEP   Person(s) Educated Patient   Methods Explanation;Demonstration;Handout   Comprehension Verbalized understanding;Returned demonstration;Verbal cues required             PT Long Term Goals - 11/15/15 1742      PT LONG TERM GOAL #1   Title Pt will improve MMT scores by 1/2 grade to promote functional strength gains   Baseline Strength: R/L hip flexion 4+/4, knee extension 4+/4-, knee flexion 4+/3+, dorsiflexion  5/4, plantarflexion 5/4-.   Time 4   Period Weeks   Status New     PT LONG TERM GOAL #2   Title Pt will improve BERG score by 10 pts to promote safety with ambulation and functional tasks.   Baseline BERG 7/31: 36/56   Time 4   Period Weeks   Status New     PT LONG TERM GOAL #3   Title Pt will be able to perform tandem stance with LLE posterior for >30 sec to promote balance gains and safety with dynamic standing/reaching tasks   Baseline pt able to balance <5 sec with LLE posterior   Time 4   Period Weeks   Status New     PT LONG TERM GOAL #4   Title Pt will demonstrates safety with ambulation using least restrictive AD with no LOB with 10 minutes of overground walking.    Baseline pt with unsteady independent gait   Time 4   Period Weeks               Plan - 11/22/15 1535    Clinical Impression Statement Pt states inconsistent gait pattern when ambulating in // bars with occasional wide base of support followed by scissoring and reliance on bilateral UE to prevent LOB. Pt with several instances of LOB in // bars; able to self-correct with UE support. Pt ambulates in clinic with rollator with improved stability. Pt with early onset of fatigue during amb/standing tasks; requiring several seated rest breaks during therapy tx.   Rehab Potential Fair   PT Frequency 2x / week   PT Duration 4 weeks   PT Treatment/Interventions ADLs/Self Care Home Management;Aquatic Therapy;Neuromuscular re-education;Balance training;Therapeutic exercise;Therapeutic activities;Functional mobility training;Stair training;Gait training;DME Instruction;Patient/family education;Manual techniques;Energy conservation   PT Next Visit Plan balance HEP; review current HEP   Consulted and Agree with Plan of Care Patient      Patient will benefit from skilled therapeutic intervention in order to improve the following deficits and impairments:  Abnormal gait, Decreased endurance, Decreased activity  tolerance, Decreased strength, Pain, Difficulty walking, Decreased mobility, Decreased balance, Decreased range of motion, Improper body mechanics, Postural dysfunction, Decreased safety awareness  Visit Diagnosis: Muscle weakness  Difficulty in walking, not elsewhere classified  Abnormality of gait due to impairment of balance     Problem List Patient Active Problem List   Diagnosis Date Noted  . Colon cancer [facility]) 10/12/2014    Mickel Baas Krisy Dix SPT Pura Spice, PT, DPT # [phone] 11/22/2015, 3:38 PM  [facility] [address] [place], [place], [address] Phone: [phone]   Fax:  [phone]  Name: Tiffany Hawkins MRN: [id number] Date of Birth: 01-30-1946

## 2015-11-23 ENCOUNTER — Other Ambulatory Visit: Payer: Self-pay | Admitting: Nurse Practitioner

## 2015-11-23 DIAGNOSIS — R519 Headache, unspecified: Secondary | ICD-10-CM

## 2015-11-23 DIAGNOSIS — R51 Headache: Principal | ICD-10-CM

## 2015-11-24 ENCOUNTER — Encounter: Payer: Self-pay | Admitting: Physical Therapy

## 2015-11-24 ENCOUNTER — Ambulatory Visit: Payer: Commercial Managed Care - HMO | Admitting: Physical Therapy

## 2015-11-24 DIAGNOSIS — M6281 Muscle weakness (generalized): Secondary | ICD-10-CM | POA: Diagnosis not present

## 2015-11-24 DIAGNOSIS — R2689 Other abnormalities of gait and mobility: Secondary | ICD-10-CM | POA: Diagnosis not present

## 2015-11-24 DIAGNOSIS — R262 Difficulty in walking, not elsewhere classified: Secondary | ICD-10-CM | POA: Diagnosis not present

## 2015-11-25 NOTE — Therapy (Addendum)
[facility] [address] [place], [place], [address] Phone: [phone]   Fax:  [phone]  Physical Therapy Treatment  Patient Details  Name: Tiffany Hawkins MRN: [id number] Date of Birth: [birth year] Referring Provider: Leretha Pol  Encounter Date: 11/24/2015      PT End of Session - 11/25/15 1748    Visit Number 4   Number of Visits 8   Date for PT Re-Evaluation 12/13/15   Authorization - Visit Number 4   Authorization - Number of Visits 10   PT Start Time [id number]   PT Stop Time 1401   PT Time Calculation (min) 59 min   Equipment Utilized During Treatment Gait belt   Activity Tolerance Patient tolerated treatment well;No increased pain;Patient limited by fatigue   Behavior During Therapy [facility] for tasks assessed/performed      Past Medical History:  Diagnosis Date  . Anxiety   . Colon cancer ([place]) 10/12/2014   had chemo  . Diabetes mellitus without complication ([place])   . Hyperlipidemia   . Hypertension   . Lateral epicondylitis   . Polio   . Sleep apnea     Past Surgical History:  Procedure Laterality Date  . APPENDECTOMY    . BACK SURGERY    . CERVICAL DISC SURGERY    . CHOLECYSTECTOMY    . COLON SURGERY    . COLONOSCOPY    . COLONOSCOPY WITH PROPOFOL N/A 05/17/2015   Procedure: COLONOSCOPY WITH PROPOFOL;  Surgeon: Manya Silvas, MD;  Location: [facility] ENDOSCOPY;  Service: Endoscopy;  Laterality: N/A;  . ESOPHAGOGASTRODUODENOSCOPY    . HERNIA REPAIR    . PORT A CATH INJECTION ([place] HX)    . TUBAL LIGATION      There were no vitals filed for this visit.      Subjective Assessment - 11/24/15 1341    Pertinent History (P)  Frequent falls; typically posterior   Limitations (P)  Walking;House hold activities   How long can you walk comfortably? (P)  30 minutes   Patient Stated Goals (P)  pt would like to be able to amb safely, reduce falls, and be able to care for her great grandchildren   Currently  in Pain? (P)  No/denies      Pt. reports no falls but several LOB at home and requires use of furniture/ walls to maintain balance/safety.      Objective:  Therapeutic Exercise: In // bars : Step ups/downs on 6'' step 2x20 with UE support (cuing for less assist); pt able to progress to no UE support but requiring CGA for safety. Seated knee extension RLE 3#, LLE 2# 2x20. TG knee flexion with pt c/o fatigue in anterior thigh. Heel raises 2x20. Resisted plantarflexion with red theraband 1x10 on LLE. Resisted dorsiflexion with red theraband 1x10 on LLE.  Gait training: Amb in clinic with rollator focusing on heel strike and postural stability. Pt demonstrates overall improved gait mechanics and stability as compared to indep amb.  PT reinforced use/ need for rollator but pt. Verbalizes her desire to amb. With no device.   Neuromuscular Re-ed: in // bars: static balance trials on air ex pad 5x1 minute. Cone taps with BLE and UE prn (pt alternating R/L) 3x15 with bilateral LE serving as stance leg. Standing alt. UE/LE and turning activities in //-bars with CGA for safety/ cuing.   Pt response for medical necessity: Pt demonstrates deficits in balance, LE strength and overall endurance. She will benefit from skilled  PT program to promote safe ambulation strategy and improve overall stability and strength of LE.     Pt. has staggering gait/ posterior LOB with obstacle course and hallway activites.  L LE muscle weakness with inability to prevent falls when LOB backwards/ lateral without UE assist.  Pt reenforced need for assistive device when walking inside/ outside but pt. hesitant/ resistant.          PT Long Term Goals - 11/15/15 1742      PT LONG TERM GOAL #1   Title Pt will improve MMT scores by 1/2 grade to promote functional strength gains   Baseline Strength: R/L hip flexion 4+/4, knee extension 4+/4-, knee flexion 4+/3+, dorsiflexion 5/4, plantarflexion 5/4-.   Time 4   Period  Weeks   Status New     PT LONG TERM GOAL #2   Title Pt will improve BERG score by 10 pts to promote safety with ambulation and functional tasks.   Baseline BERG 7/31: 36/56   Time 4   Period Weeks   Status New     PT LONG TERM GOAL #3   Title Pt will be able to perform tandem stance with LLE posterior for >30 sec to promote balance gains and safety with dynamic standing/reaching tasks   Baseline pt able to balance <5 sec with LLE posterior   Time 4   Period Weeks   Status New     PT LONG TERM GOAL #4   Title Pt will demonstrates safety with ambulation using least restrictive AD with no LOB with 10 minutes of overground walking.    Baseline pt with unsteady independent gait   Time 4   Period Weeks           Plan - 11/25/15 1750    Rehab Potential Fair   PT Frequency 2x / week   PT Duration 4 weeks   PT Treatment/Interventions ADLs/Self Care Home Management;Aquatic Therapy;Neuromuscular re-education;Balance training;Therapeutic exercise;Therapeutic activities;Functional mobility training;Stair training;Gait training;DME Instruction;Patient/family education;Manual techniques;Energy conservation   PT Next Visit Plan balance HEP; review current HEP      Patient will benefit from skilled therapeutic intervention in order to improve the following deficits and impairments:  Abnormal gait, Decreased endurance, Decreased activity tolerance, Decreased strength, Pain, Difficulty walking, Decreased mobility, Decreased balance, Decreased range of motion, Improper body mechanics, Postural dysfunction, Decreased safety awareness  Visit Diagnosis: Muscle weakness  Difficulty in walking, not elsewhere classified  Abnormality of gait due to impairment of balance     Problem List Patient Active Problem List   Diagnosis Date Noted  . Colon cancer ([place]) 10/12/2014   Pura Spice, PT, DPT # [phone]  11/25/2015, 5:52 PM   [facility] [address] [place], [place], [address] Phone: [phone]   Fax:  [phone]  Name: Tiffany Hawkins MRN: [id number] Date of Birth: 27-Sep-1945

## 2015-11-29 ENCOUNTER — Ambulatory Visit: Payer: Commercial Managed Care - HMO | Admitting: Physical Therapy

## 2015-11-29 DIAGNOSIS — M6281 Muscle weakness (generalized): Secondary | ICD-10-CM | POA: Diagnosis not present

## 2015-11-29 DIAGNOSIS — R2689 Other abnormalities of gait and mobility: Secondary | ICD-10-CM | POA: Diagnosis not present

## 2015-11-29 DIAGNOSIS — R262 Difficulty in walking, not elsewhere classified: Secondary | ICD-10-CM | POA: Diagnosis not present

## 2015-11-30 ENCOUNTER — Encounter: Payer: Self-pay | Admitting: Physical Therapy

## 2015-11-30 NOTE — Therapy (Addendum)
[facility] [address] [place], [place], [address] Phone: [phone]   Fax:  [phone]  Physical Therapy Treatment  Patient Details  Name: Tiffany Hawkins MRN: [id number] Date of Birth: 15-Apr-1946 Referring Provider: Leretha Pol  Encounter Date: 11/29/2015      PT End of Session - 11/30/15 1629    Visit Number 5   Number of Visits 8   Date for PT Re-Evaluation 12/13/15   Authorization - Visit Number 5   Authorization - Number of Visits 10   PT Start Time 1301   PT Stop Time [id number]   PT Time Calculation (min) 57 min   Equipment Utilized During Treatment Gait belt   Activity Tolerance Patient tolerated treatment well;No increased pain;Patient limited by fatigue   Behavior During Therapy [facility] for tasks assessed/performed      Past Medical History:  Diagnosis Date  . Anxiety   . Colon cancer ([place]) 10/12/2014   had chemo  . Diabetes mellitus without complication ([place])   . Hyperlipidemia   . Hypertension   . Lateral epicondylitis   . Polio   . Sleep apnea     Past Surgical History:  Procedure Laterality Date  . APPENDECTOMY    . BACK SURGERY    . CERVICAL DISC SURGERY    . CHOLECYSTECTOMY    . COLON SURGERY    . COLONOSCOPY    . COLONOSCOPY WITH PROPOFOL N/A 05/17/2015   Procedure: COLONOSCOPY WITH PROPOFOL;  Surgeon: Manya Silvas, MD;  Location: [facility] ENDOSCOPY;  Service: Endoscopy;  Laterality: N/A;  . ESOPHAGOGASTRODUODENOSCOPY    . HERNIA REPAIR    . PORT A CATH INJECTION ([place] HX)    . TUBAL LIGATION      There were no vitals filed for this visit.      Subjective Assessment - 11/30/15 1629    Pertinent History Frequent falls; typically posterior   Limitations Walking;House hold activities   How long can you walk comfortably? 30 minutes   Patient Stated Goals pt would like to be able to amb safely, reduce falls, and be able to care for her great grandchildren      No falls but several  small LOB reported with walking around home and outside of house.  Pt. not using an assistive device but states she has a blue rollator at home.      Objective:  Therapeutic Exercise: In // bars : Step ups/downs on 6'' step 2x20 with UE support (cuing for less assist); pt able to progress to no UE support but requiring CGA for safety. Resisted gait 1BTB 10x all 4-planes (min. UE assist and verbal cuing provided).  TG knee flexion 10x3.    Gait training: Amb in clinic with rollator focusing on heel strike and postural stability. Pt demonstrates overall improved gait mechanics and stability as compared to indep amb.  PT reinforced use/ need for rollator but pt. Verbalizes her desire to amb. With no device.   Neuromuscular Re-ed: in // bars: Standing alt. UE/LE and turning activities in //-bars with CGA for safety/ cuing.  Functional reaching to cones/ picking up cones and maneuvering obstacles in hallway.  PT provides cuing to increase head position/ posture to ensure safety and proper technique (several small LOB with UE assist required).     Pt response for medical necessity: Pt demonstrates deficits in balance, LE strength and overall endurance. She will benefit from skilled PT program to promote safe ambulation strategy and improve overall  stability and strength of LE.    Pt. is having an MRI of the brain on 12/07/15.  Pt. continues to remain limited with balance tasks/ L LE muscle weakness and tends to lose balance backwards.  Pt. ambulated around PT clinic with use of rollator with increase cadence/ proper technique/ marked improvement with functional mobility.   PT is having a difficult time convincing pt. that she benefits from an assistive device even with several LOB during balance/ gait tasks.          PT Long Term Goals - 11/15/15 1742      PT LONG TERM GOAL #1   Title Pt will improve MMT scores by 1/2 grade to promote functional strength gains   Baseline Strength: R/L hip  flexion 4+/4, knee extension 4+/4-, knee flexion 4+/3+, dorsiflexion 5/4, plantarflexion 5/4-.   Time 4   Period Weeks   Status New     PT LONG TERM GOAL #2   Title Pt will improve BERG score by 10 pts to promote safety with ambulation and functional tasks.   Baseline BERG 7/31: 36/56   Time 4   Period Weeks   Status New     PT LONG TERM GOAL #3   Title Pt will be able to perform tandem stance with LLE posterior for >30 sec to promote balance gains and safety with dynamic standing/reaching tasks   Baseline pt able to balance <5 sec with LLE posterior   Time 4   Period Weeks   Status New     PT LONG TERM GOAL #4   Title Pt will demonstrates safety with ambulation using least restrictive AD with no LOB with 10 minutes of overground walking.    Baseline pt with unsteady independent gait   Time 4   Period Weeks            Plan - 11/30/15 1630    Rehab Potential Fair   PT Frequency 2x / week   PT Duration 4 weeks   PT Treatment/Interventions ADLs/Self Care Home Management;Aquatic Therapy;Neuromuscular re-education;Balance training;Therapeutic exercise;Therapeutic activities;Functional mobility training;Stair training;Gait training;DME Instruction;Patient/family education;Manual techniques;Energy conservation   PT Next Visit Plan Balance HEP.     Consulted and Agree with Plan of Care Patient      Patient will benefit from skilled therapeutic intervention in order to improve the following deficits and impairments:  Abnormal gait, Decreased endurance, Decreased activity tolerance, Decreased strength, Pain, Difficulty walking, Decreased mobility, Decreased balance, Decreased range of motion, Improper body mechanics, Postural dysfunction, Decreased safety awareness  Visit Diagnosis: Muscle weakness  Difficulty in walking, not elsewhere classified  Abnormality of gait due to impairment of balance     Problem List Patient Active Problem List   Diagnosis Date Noted  . Colon  cancer ([place]) 10/12/2014   Pura Spice, PT, DPT # [phone]  11/30/2015, 4:31 PM  [facility] [address] [place], [place], [address] Phone: [phone]   Fax:  [phone]  Name: Nimco Mchenry MRN: [id number] Date of Birth: [birth year]

## 2015-12-01 ENCOUNTER — Ambulatory Visit: Payer: Commercial Managed Care - HMO | Admitting: Physical Therapy

## 2015-12-01 DIAGNOSIS — R55 Syncope and collapse: Secondary | ICD-10-CM | POA: Diagnosis not present

## 2015-12-02 ENCOUNTER — Encounter: Payer: Self-pay | Admitting: Physical Therapy

## 2015-12-02 ENCOUNTER — Ambulatory Visit: Payer: Commercial Managed Care - HMO | Admitting: Physical Therapy

## 2015-12-02 DIAGNOSIS — R2689 Other abnormalities of gait and mobility: Secondary | ICD-10-CM

## 2015-12-02 DIAGNOSIS — M6281 Muscle weakness (generalized): Secondary | ICD-10-CM

## 2015-12-02 DIAGNOSIS — R262 Difficulty in walking, not elsewhere classified: Secondary | ICD-10-CM

## 2015-12-02 NOTE — Therapy (Addendum)
[facility] [address] [place], [place], [address] Phone: [phone]   Fax:  [phone]  Physical Therapy Treatment  Patient Details  Name: Tiffany Hawkins MRN: [id number] Date of Birth: 20-Mar-1946 Referring Provider: Leretha Pol  Encounter Date: 12/02/2015      PT End of Session - 12/03/15 1627    Visit Number 6   Number of Visits 8   Date for PT Re-Evaluation 12/13/15   Authorization - Visit Number 6   Authorization - Number of Visits 10   PT Start Time [id number]   PT Stop Time 1400   PT Time Calculation (min) 58 min   Activity Tolerance Patient tolerated treatment well;No increased pain;Patient limited by fatigue   Behavior During Therapy [facility] for tasks assessed/performed      Past Medical History:  Diagnosis Date  . Anxiety   . Colon cancer ([place]) 10/12/2014   had chemo  . Diabetes mellitus without complication ([place])   . Hyperlipidemia   . Hypertension   . Lateral epicondylitis   . Polio   . Sleep apnea     Past Surgical History:  Procedure Laterality Date  . APPENDECTOMY    . BACK SURGERY    . CERVICAL DISC SURGERY    . CHOLECYSTECTOMY    . COLON SURGERY    . COLONOSCOPY    . COLONOSCOPY WITH PROPOFOL N/A 05/17/2015   Procedure: COLONOSCOPY WITH PROPOFOL;  Surgeon: Manya Silvas, MD;  Location: [facility] ENDOSCOPY;  Service: Endoscopy;  Laterality: N/A;  . ESOPHAGOGASTRODUODENOSCOPY    . HERNIA REPAIR    . PORT A CATH INJECTION ([place] HX)    . TUBAL LIGATION      There were no vitals filed for this visit.      Subjective Assessment - 12/02/15 1251    Pertinent History Frequent falls; typically posterior   Limitations Walking;House hold activities   How long can you walk comfortably? 30 minutes   Patient Stated Goals pt would like to be able to amb safely, reduce falls, and be able to care for her great grandchildren   Currently in Pain? No/denies      Pt. states she is tired today but ready  to work with PT.  No c/o pain and states L lower leg is weak.  No falls.     Objective:  Therapeutic Exercise: In // bars : Scifit L6.5 5 min. Forward/ 5 min. Backwards (consistent cadence/ warm-up/ no charge)- B LE only.  Resisted gait 1BTB 10x all 4-planes in //-bars with min. To no UE assist.  TG knee flexion with ball 30x (no heel raises due to L gastroc weakness).  Supine ball ex. (knee to chest/ SLR/ bridging/ trunk rotn.)- 10x2.    Gait training: Amb outside of clinic with rollator focusing on heel strike and postural stability. Pt demonstrates overall improved gait mechanics and stability as compared to indep amb.  Moderate cuing to ensure consistent hip/knee flexion and heel strike.  Occasional lateral staggering gait without use of assistive device/ UE support.    Neuromuscular Re-ed: in // bars: Obstacle course (cone maneuvering/ cone taps/ step ups and overs/ Aire[employer] tasks).     Pt response for medical necessity: Pt demonstrates deficits in balance, LE strength and overall endurance. She will benefit from skilled PT program to promote safe ambulation strategy and improve overall stability and strength of LE.   Several episodes of staggering gait (lateral/posterior) with hallway obstacle course and balance tasks with no  UE assist.  Pt. ambulates with improved gait pattern/ safety with use of rollator outside of clinic in parking lot/ sidewalks.  Pt. unable to complete full L ankle PF/ heel raises on TG due to muscle weakness/ fatigue.  PT will reassess pt. goals over the next 2 PT tx. sessions to determine recert vs. discharge.         PT Long Term Goals - 11/15/15 1742      PT LONG TERM GOAL #1   Title Pt will improve MMT scores by 1/2 grade to promote functional strength gains   Baseline Strength: R/L hip flexion 4+/4, knee extension 4+/4-, knee flexion 4+/3+, dorsiflexion 5/4, plantarflexion 5/4-.   Time 4   Period Weeks   Status New     PT LONG TERM GOAL #2   Title Pt  will improve BERG score by 10 pts to promote safety with ambulation and functional tasks.   Baseline BERG 7/31: 36/56   Time 4   Period Weeks   Status New     PT LONG TERM GOAL #3   Title Pt will be able to perform tandem stance with LLE posterior for >30 sec to promote balance gains and safety with dynamic standing/reaching tasks   Baseline pt able to balance <5 sec with LLE posterior   Time 4   Period Weeks   Status New     PT LONG TERM GOAL #4   Title Pt will demonstrates safety with ambulation using least restrictive AD with no LOB with 10 minutes of overground walking.    Baseline pt with unsteady independent gait   Time 4   Period Weeks            Plan - 12/03/15 1627    Rehab Potential Fair   PT Frequency 2x / week   PT Duration 4 weeks   PT Treatment/Interventions ADLs/Self Care Home Management;Aquatic Therapy;Neuromuscular re-education;Balance training;Therapeutic exercise;Therapeutic activities;Functional mobility training;Stair training;Gait training;DME Instruction;Patient/family education;Manual techniques;Energy conservation   PT Next Visit Plan Discuss use of rollator at home/ HEP progression   Consulted and Agree with Plan of Care Patient      Patient will benefit from skilled therapeutic intervention in order to improve the following deficits and impairments:  Abnormal gait, Decreased endurance, Decreased activity tolerance, Decreased strength, Pain, Difficulty walking, Decreased mobility, Decreased balance, Decreased range of motion, Improper body mechanics, Postural dysfunction, Decreased safety awareness  Visit Diagnosis: Muscle weakness  Difficulty in walking, not elsewhere classified  Abnormality of gait due to impairment of balance     Problem List Patient Active Problem List   Diagnosis Date Noted  . Colon cancer ([place]) 10/12/2014   Pura Spice, PT, DPT # [id number]  12/03/2015, 4:29 PM  [facility] [facility] [address] [place], [place], [address] Phone: [phone]   Fax:  [phone]  Name: Tiffany Hawkins MRN: [id number] Date of Birth: 25-Mar-1946

## 2015-12-06 ENCOUNTER — Encounter: Payer: Self-pay | Admitting: Physical Therapy

## 2015-12-06 ENCOUNTER — Ambulatory Visit: Payer: Commercial Managed Care - HMO | Admitting: Physical Therapy

## 2015-12-06 DIAGNOSIS — M6281 Muscle weakness (generalized): Secondary | ICD-10-CM

## 2015-12-06 DIAGNOSIS — R262 Difficulty in walking, not elsewhere classified: Secondary | ICD-10-CM | POA: Diagnosis not present

## 2015-12-06 DIAGNOSIS — R2689 Other abnormalities of gait and mobility: Secondary | ICD-10-CM | POA: Diagnosis not present

## 2015-12-06 NOTE — Therapy (Signed)
[facility] [address] [place], [place], [address] Phone: [phone]   Fax:  [phone]  Physical Therapy Treatment  Patient Details  Name: Tiffany Hawkins MRN: [id number] Date of Birth: 1946-03-01 Referring Provider: Leretha Pol  Encounter Date: 12/06/2015      PT End of Session - 12/06/15 1413    Visit Number 7   Number of Visits 8   Date for PT Re-Evaluation 12/13/15   Authorization - Visit Number 7   Authorization - Number of Visits 10   PT Start Time 1301   PT Stop Time 1350   PT Time Calculation (min) 49 min   Equipment Utilized During Treatment Gait belt   Activity Tolerance Patient tolerated treatment well;No increased pain;Patient limited by fatigue   Behavior During Therapy [facility] for tasks assessed/performed      Past Medical History:  Diagnosis Date  . Anxiety   . Colon cancer ([place]) 10/12/2014   had chemo  . Diabetes mellitus without complication ([place])   . Hyperlipidemia   . Hypertension   . Lateral epicondylitis   . Polio   . Sleep apnea     Past Surgical History:  Procedure Laterality Date  . APPENDECTOMY    . BACK SURGERY    . CERVICAL DISC SURGERY    . CHOLECYSTECTOMY    . COLON SURGERY    . COLONOSCOPY    . COLONOSCOPY WITH PROPOFOL N/A 05/17/2015   Procedure: COLONOSCOPY WITH PROPOFOL;  Surgeon: Manya Silvas, MD;  Location: [facility] ENDOSCOPY;  Service: Endoscopy;  Laterality: N/A;  . ESOPHAGOGASTRODUODENOSCOPY    . HERNIA REPAIR    . PORT A CATH INJECTION ([place] HX)    . TUBAL LIGATION      There were no vitals filed for this visit.      Subjective Assessment - 12/06/15 1349    Subjective Pt states that she suffered a minor fall over on Saturday. States that she fell forward while reaching for a towel. Pt with no serious injuries, minor scrape on L knee. Pt states she felt majority of pain in her back.   Pertinent History Frequent falls; typically posterior   Limitations  Walking;House hold activities   How long can you walk comfortably? 30 minutes   Patient Stated Goals pt would like to be able to amb safely, reduce falls, and be able to care for her great grandchildren   Currently in Pain? Yes   Pain Score 7    Pain Location Back   Pain Orientation Lower   Pain Onset In the past 7 days      Objective:  Therapeutic Exercise: NuStep 5 minutes Level 4 (warm up/no charge). In // bars: forward reaching task with cones; emphasis on LE stability. Lumbar AROM: ext x10, flex x10, LF x10, ROT R/L x10. Step ups 6'' step with BUE progressing to no UE. Standing stretches hamstring 45sec x2, gastroc 45sec x2.   Neuromuscular Re-ed: tandem walking in // bars 46ft x6, backwards tandem walking in // bars 29ft x6 with UE prn; emphasis on no UE support. Static stance on air ex 1 minute x5 partial tandem.   Pt response for medical necessity: Pt demonstrates mild instability with forward reaching tasks requiring SPT assist x2 for LOB. She shows some difficulty with foot clearance during stepping task but is able to self correct LOB in all instances. Pt with initial c/o of LBP at beginning of session; following treatment she reports significant decrease in LBP  from 7/10 - 3/10.        PT Long Term Goals - 11/15/15 1742      PT LONG TERM GOAL #1   Title Pt will improve MMT scores by 1/2 grade to promote functional strength gains   Baseline Strength: R/L hip flexion 4+/4, knee extension 4+/4-, knee flexion 4+/3+, dorsiflexion 5/4, plantarflexion 5/4-.   Time 4   Period Weeks   Status New     PT LONG TERM GOAL #2   Title Pt will improve BERG score by 10 pts to promote safety with ambulation and functional tasks.   Baseline BERG 7/31: 36/56   Time 4   Period Weeks   Status New     PT LONG TERM GOAL #3   Title Pt will be able to perform tandem stance with LLE posterior for >30 sec to promote balance gains and safety with dynamic standing/reaching tasks   Baseline pt  able to balance <5 sec with LLE posterior   Time 4   Period Weeks   Status New     PT LONG TERM GOAL #4   Title Pt will demonstrates safety with ambulation using least restrictive AD with no LOB with 10 minutes of overground walking.    Baseline pt with unsteady independent gait   Time 4   Period Weeks               Plan - 12/06/15 1414    Clinical Impression Statement Pt demonstrates difficulty with forward reaching task; with 2 LOB requiring SPT min A for correction. Pt demonstrates stiffness in lumbar AROM and LE; responds well to brief lumbar AROM ther ex and hamstring/gastroc stretch with overall decrease in LBP. Pt continues to be reluctant to implement rollator/rw into lifestyle but is considering it after fall this weekend.   Rehab Potential Fair   PT Frequency 2x / week   PT Duration 4 weeks   PT Treatment/Interventions ADLs/Self Care Home Management;Aquatic Therapy;Neuromuscular re-education;Balance training;Therapeutic exercise;Therapeutic activities;Functional mobility training;Stair training;Gait training;DME Instruction;Patient/family education;Manual techniques;Energy conservation   PT Next Visit Plan Discuss use of rollator at home/ HEP progression   Consulted and Agree with Plan of Care Patient      Patient will benefit from skilled therapeutic intervention in order to improve the following deficits and impairments:  Abnormal gait, Decreased endurance, Decreased activity tolerance, Decreased strength, Pain, Difficulty walking, Decreased mobility, Decreased balance, Decreased range of motion, Improper body mechanics, Postural dysfunction, Decreased safety awareness  Visit Diagnosis: Muscle weakness  Difficulty in walking, not elsewhere classified  Abnormality of gait due to impairment of balance     Problem List Patient Active Problem List   Diagnosis Date Noted  . Colon cancer ([place]) 10/12/2014   Pura Spice, PT, DPT # [id number] Tiffany Hawkins  SPT 12/06/2015, 2:23 PM  [place] [facility] [address] [place], [place], [address] Phone: [phone]   Fax:  [phone]  Name: Tiffany Hawkins MRN: [id number] Date of Birth: [birth year]

## 2015-12-07 ENCOUNTER — Ambulatory Visit
Admission: RE | Admit: 2015-12-07 | Discharge: 2015-12-07 | Disposition: A | Discharge status: Home / Self Care | Payer: Commercial Managed Care - HMO | Source: Ambulatory Visit | Attending: Nurse Practitioner | Admitting: Nurse Practitioner

## 2015-12-07 ENCOUNTER — Inpatient Hospital Stay: Payer: Commercial Managed Care - HMO | Attending: Oncology

## 2015-12-07 DIAGNOSIS — R51 Headache: Secondary | ICD-10-CM | POA: Diagnosis not present

## 2015-12-07 DIAGNOSIS — Z452 Encounter for adjustment and management of vascular access device: Secondary | ICD-10-CM | POA: Diagnosis not present

## 2015-12-07 DIAGNOSIS — Z85038 Personal history of other malignant neoplasm of large intestine: Secondary | ICD-10-CM | POA: Diagnosis not present

## 2015-12-07 DIAGNOSIS — R519 Headache, unspecified: Secondary | ICD-10-CM

## 2015-12-07 DIAGNOSIS — Z95828 Presence of other vascular implants and grafts: Secondary | ICD-10-CM

## 2015-12-07 MED ORDER — SODIUM CHLORIDE 0.9% FLUSH
10.0000 mL | INTRAVENOUS | Status: DC | PRN
  Administered 2015-12-07: 10 mL via INTRAVENOUS
  Filled 2015-12-07: qty 10

## 2015-12-07 MED ORDER — HEPARIN SOD (PORK) LOCK FLUSH 100 UNIT/ML IV SOLN
500.0000 [IU] | Freq: Once | INTRAVENOUS | Status: AC
  Administered 2015-12-07: 500 [IU] via INTRAVENOUS
  Filled 2015-12-07: qty 5

## 2015-12-08 ENCOUNTER — Ambulatory Visit: Payer: Commercial Managed Care - HMO | Admitting: Physical Therapy

## 2015-12-08 ENCOUNTER — Encounter: Payer: Self-pay | Admitting: Physical Therapy

## 2015-12-08 DIAGNOSIS — M6281 Muscle weakness (generalized): Secondary | ICD-10-CM | POA: Diagnosis not present

## 2015-12-08 DIAGNOSIS — R2689 Other abnormalities of gait and mobility: Secondary | ICD-10-CM

## 2015-12-08 DIAGNOSIS — R262 Difficulty in walking, not elsewhere classified: Secondary | ICD-10-CM

## 2015-12-08 NOTE — Therapy (Signed)
[facility] [address] [place], [place], [address] Phone: [phone]   Fax:  [phone]  Physical Therapy Treatment  Patient Details  Name: Tiffany Hawkins MRN: [id number] Date of Birth: 03/03/1946 Referring Provider: Leretha Pol  Encounter Date: 12/08/2015      PT End of Session - 12/08/15 1302    Visit Number 8   Number of Visits 12   Date for PT Re-Evaluation 12/13/15   Authorization - Visit Number 8   Authorization - Number of Visits 10   PT Start Time 1975   PT Stop Time 8832   PT Time Calculation (min) 53 min   Equipment Utilized During Treatment Gait belt   Activity Tolerance Patient tolerated treatment well;No increased pain;Patient limited by fatigue   Behavior During Therapy [facility] for tasks assessed/performed      Past Medical History:  Diagnosis Date  . Anxiety   . Colon cancer ([place]) 10/12/2014   had chemo  . Diabetes mellitus without complication ([place])   . Hyperlipidemia   . Hypertension   . Lateral epicondylitis   . Polio   . Sleep apnea     Past Surgical History:  Procedure Laterality Date  . APPENDECTOMY    . BACK SURGERY    . CERVICAL DISC SURGERY    . CHOLECYSTECTOMY    . COLON SURGERY    . COLONOSCOPY    . COLONOSCOPY WITH PROPOFOL N/A 05/17/2015   Procedure: COLONOSCOPY WITH PROPOFOL;  Surgeon: Manya Silvas, MD;  Location: [facility] ENDOSCOPY;  Service: Endoscopy;  Laterality: N/A;  . ESOPHAGOGASTRODUODENOSCOPY    . HERNIA REPAIR    . PORT A CATH INJECTION ([place] HX)    . TUBAL LIGATION      There were no vitals filed for this visit.      Subjective Assessment - 12/08/15 1259    Subjective Pt reports no falls since last appointment, says she has been doing her stretching but only feels it on RLE. She says she felt moderate muscle soreness after last appointment but feels difficulty was appropriate.    Pertinent History Frequent falls; typically posterior   Limitations  Walking;House hold activities   How long can you walk comfortably? 30 minutes   Patient Stated Goals pt would like to be able to amb safely, reduce falls, and be able to care for her great grandchildren   Currently in Pain? No/denies      Objective:  Therapeutic Exercise: NuStep 10 minutes Level 5.5 (warm up/no charge). In // bars: alternating steps on 6'' step x30 bilaterally. Supine bridge 1x10, with RTB at proximal knee for glute/hip abd emphasis 2x10. Sidelying clamshell 2x10 BLE. Sidelying reverse clamshell 1x10 BLE. Sidelying hip abd 1x10 BLE. Pt requires tactile cueing for isolation of hip abductors.  Neuromuscular Re-ed: static balance in // bars 2 minutes. Narrow base of support 1 minute. Eyes closed static balance 10 sec x2. Tandem stance 30sec x2. Single leg stance BLE static balance x10 with 1-10 sec holds. Forward reach with no UE x5.   Pt response for medical necessity: Pt demonstrates improvements in functional mobility and balance with limited strength gains. She continues to experience falls and would benefit from use of assistive device and continued physical therapy to decrease fall risk and promote safety with ambulation/household tasks.       PT Long Term Goals - 12/08/15 1532      PT LONG TERM GOAL #1   Title Pt will improve MMT scores  by 1/2 grade to promote functional strength gains   Baseline Strength: R/L hip flexion 4+/4, knee extension 4+/4-, knee flexion 4+/3+, dorsiflexion 5/4, plantarflexion 5/4-. 8/23: hip add 4/4-, hip abd 4+/4-   Time 4   Period Weeks   Status Partially Met     PT LONG TERM GOAL #2   Title Pt will improve BERG score by 10 pts to promote safety with ambulation and functional tasks.   Baseline BERG 7/31: 36/56. 8/23: 52/56 with instability for set up   Time 4   Period Weeks   Status Achieved     PT LONG TERM GOAL #3   Title Pt will be able to perform tandem stance with LLE posterior for >30 sec to promote balance gains and safety  with dynamic standing/reaching tasks   Baseline pt able to balance <5 sec with LLE posterior 8/23: pt able to achieve 30 sec 3/5 trials   Time 4   Period Weeks   Status Partially Met     PT LONG TERM GOAL #4   Title Pt will demonstrates safety with ambulation using least restrictive AD with no LOB with 10 minutes of overground walking.    Baseline pt with unsteady independent gait. 8/23: pt not interested in using AD; continues to amb indep with low endurance   Time 4   Period Weeks   Status Partially Met     PT LONG TERM GOAL #5   Title Pt will achieve SLS on BLE for >15 secs to promote safety with static/dynamic balance tasks and decrease fall risk   Baseline 8/23: pt able to achieve SLS on RLE >5 sec, LLE <5 sec   Time 4   Period Weeks   Status New               Plan - 12/08/15 1526    Clinical Impression Statement Pt demonstrates improvement in static/dynamic balance testing with score of 52/56 on BERG balance assessment with inconsistent presentation of instability with set up tasks. She continues to demonstrate weakness in bilateral lower extremities with abd 4+/4-, add 4/4 with minimal improvement in all other planes. Pt with tendency for hip flexor/quad dominance when performing hip abduction exercises; improved isolation of glute/hip abd with theraband resistance and forward positioning of hip. Pt demonstrates progress in balance and functional mobility but still with progress to be made. She will benefit from skilled PT to progress balance/strengthening program to promote safe functional mobility.   Rehab Potential Fair   PT Frequency 2x / week   PT Duration 4 weeks   PT Treatment/Interventions ADLs/Self Care Home Management;Aquatic Therapy;Neuromuscular re-education;Balance training;Therapeutic exercise;Therapeutic activities;Functional mobility training;Stair training;Gait training;DME Instruction;Patient/family education;Manual techniques;Energy conservation   PT Next  Visit Plan progress balance/strengthening tasks; glute/abd focus   Consulted and Agree with Plan of Care Patient      Patient will benefit from skilled therapeutic intervention in order to improve the following deficits and impairments:  Abnormal gait, Decreased endurance, Decreased activity tolerance, Decreased strength, Pain, Difficulty walking, Decreased mobility, Decreased balance, Decreased range of motion, Improper body mechanics, Postural dysfunction, Decreased safety awareness  Visit Diagnosis: Muscle weakness  Difficulty in walking, not elsewhere classified  Abnormality of gait due to impairment of balance     Problem List Patient Active Problem List   Diagnosis Date Noted  . Colon cancer ([place]) 10/12/2014   Pura Spice, PT, DPT # [id number] Derrill Memo, SPT 12/09/2015, 3:56 PM  [facility]  [address] [place], [place], [address] Phone: [phone]   Fax:  [phone]  Name: Tiffany Hawkins MRN: [id number] Date of Birth: [birth year]

## 2015-12-13 ENCOUNTER — Encounter: Payer: Self-pay | Admitting: Physical Therapy

## 2015-12-13 ENCOUNTER — Ambulatory Visit: Payer: Commercial Managed Care - HMO | Admitting: Physical Therapy

## 2015-12-13 DIAGNOSIS — M6281 Muscle weakness (generalized): Secondary | ICD-10-CM

## 2015-12-13 DIAGNOSIS — R2689 Other abnormalities of gait and mobility: Secondary | ICD-10-CM | POA: Diagnosis not present

## 2015-12-13 DIAGNOSIS — R262 Difficulty in walking, not elsewhere classified: Secondary | ICD-10-CM

## 2015-12-13 NOTE — Therapy (Signed)
[facility] [address]. [place], [place], [address] Phone: [phone]   Fax:  [phone]  Physical Therapy Treatment  Patient Details  Name: Tiffany Hawkins MRN: [id number] Date of Birth: 01/14/1946 Referring Provider: Leretha Pol  Encounter Date: 12/13/2015      PT End of Session - 12/13/15 1749    Visit Number 9   Number of Visits 16   Date for PT Re-Evaluation 01/10/16   Authorization - Visit Number 9   Authorization - Number of Visits 18   PT Start Time 1300   PT Stop Time 1351   PT Time Calculation (min) 51 min   Equipment Utilized During Treatment Gait belt   Activity Tolerance Patient tolerated treatment well;No increased pain   Behavior During Therapy WFL for tasks assessed/performed      Past Medical History:  Diagnosis Date  . Anxiety   . Colon cancer ([place]) 10/12/2014   had chemo  . Diabetes mellitus without complication ([place])   . Hyperlipidemia   . Hypertension   . Lateral epicondylitis   . Polio   . Sleep apnea     Past Surgical History:  Procedure Laterality Date  . APPENDECTOMY    . BACK SURGERY    . CERVICAL DISC SURGERY    . CHOLECYSTECTOMY    . COLON SURGERY    . COLONOSCOPY    . COLONOSCOPY WITH PROPOFOL N/A 05/17/2015   Procedure: COLONOSCOPY WITH PROPOFOL;  Surgeon: Manya Silvas, MD;  Location: [facility] ENDOSCOPY;  Service: Endoscopy;  Laterality: N/A;  . ESOPHAGOGASTRODUODENOSCOPY    . HERNIA REPAIR    . PORT A CATH INJECTION ([place] HX)    . TUBAL LIGATION      There were no vitals filed for this visit.      Subjective Assessment - 12/13/15 1347    Subjective Pt reports no falls, complaints of mild muscle soreness in hip abductors following last session but pt says its a "feel good" sore.    Pertinent History Frequent falls; typically posterior   Limitations Walking;House hold activities   How long can you walk comfortably? 30 minutes   Patient Stated Goals pt would like  to be able to amb safely, reduce falls, and be able to care for her great grandchildren   Currently in Pain? No/denies      Objective:  Neuromuscular Re-ed: Forward reaching tasks with cones on waist height table: touches with BUE 8 sets x6 cones, cone stacking 20 cones x2. Weighted punches #2 static stance x20. Weighted bicep curls #1 static stance x20. Weighted punches on air ex #2 x20. Tricep ext #2 static stance x20. Obstacle course with 3'' steps 2 air ex pads 33f x 8 forward steps/176fx 6 lateral steps without UE support, 12 ft x 1 forward steps with UE support. Lumbar rotation/UE diagonals with 2# medicine ball on air ex pad x30 ea.  Pt response for medical necessity: Pt with no c/o of LBP this session with mild c/o of muscle soreness in posterior BLE after reaching activities. Pt with dizziness following obstacle course/dynamic gait exercise when performed without UE support.       PT Long Term Goals - 12/08/15 1532      PT LONG TERM GOAL #1   Title Pt will improve MMT scores by 1/2 grade to promote functional strength gains   Baseline Strength: R/L hip flexion 4+/4, knee extension 4+/4-, knee flexion 4+/3+, dorsiflexion 5/4, plantarflexion 5/4-. 8/23: hip add 4/4-,  hip abd 4+/4-   Time 4   Period Weeks   Status Partially Met     PT LONG TERM GOAL #2   Title Pt will improve BERG score by 10 pts to promote safety with ambulation and functional tasks.   Baseline BERG 7/31: 36/56. 8/23: 52/56 with instability for set up   Time 4   Period Weeks   Status Achieved     PT LONG TERM GOAL #3   Title Pt will be able to perform tandem stance with LLE posterior for >30 sec to promote balance gains and safety with dynamic standing/reaching tasks   Baseline pt able to balance <5 sec with LLE posterior 8/23: pt able to achieve 30 sec 3/5 trials   Time 4   Period Weeks   Status Partially Met     PT LONG TERM GOAL #4   Title Pt will demonstrates safety with ambulation using least  restrictive AD with no LOB with 10 minutes of overground walking.    Baseline pt with unsteady independent gait. 8/23: pt not interested in using AD; continues to amb indep with low endurance   Time 4   Period Weeks   Status Partially Met     PT LONG TERM GOAL #5   Title Pt will achieve SLS on BLE for >15 secs to promote safety with static/dynamic balance tasks and decrease fall risk   Baseline 8/23: pt able to achieve SLS on RLE >5 sec, LLE <5 sec   Time 4   Period Weeks   Status New            Plan - 2015/12/26 1753    Clinical Impression Statement Pt demonstrates mild-moderate difficulty with dynamic reaching tasks with early onset of fatigue in BLE. She experiences moderate dizziness with dynamic gait/obstacle course involving repeitive stepping and no UE use; pt with no c/o of dizziness when performed with UE support.   Rehab Potential Fair   PT Frequency 2x / week   PT Duration 4 weeks   PT Treatment/Interventions ADLs/Self Care Home Management;Aquatic Therapy;Neuromuscular re-education;Balance training;Therapeutic exercise;Therapeutic activities;Functional mobility training;Stair training;Gait training;DME Instruction;Patient/family education;Manual techniques;Energy conservation   PT Next Visit Plan progress balance/strengthening tasks; glute/abd focus   Consulted and Agree with Plan of Care Patient      Patient will benefit from skilled therapeutic intervention in order to improve the following deficits and impairments:  Abnormal gait, Decreased endurance, Decreased activity tolerance, Decreased strength, Pain, Difficulty walking, Decreased mobility, Decreased balance, Decreased range of motion, Improper body mechanics, Postural dysfunction, Decreased safety awareness  Visit Diagnosis: Muscle weakness  Difficulty in walking, not elsewhere classified  Abnormality of gait due to impairment of balance       G-Codes - 12-26-2015 1650    Functional Assessment Tool Used Berg  balance/ clinical impression/ muscle weakness/ gait difficulty.     Functional Limitation Mobility: Walking and moving around   Mobility: Walking and Moving Around Current Status [phone]) At least 20 percent but less than 40 percent impaired, limited or restricted   Mobility: Walking and Moving Around Goal Status [phone]) At least 1 percent but less than 20 percent impaired, limited or restricted      Problem List Patient Active Problem List   Diagnosis Date Noted  . Colon cancer ([place]) 10/12/2014   Pura Spice, PT, DPT # [id number] Derrill Memo, SPT 12/14/2015, 7:51 AM  [facility] [address] [place], [place], [address] Phone: [phone]  Fax:  [phone]  Name: Tiffany Hawkins MRN: [id number] Date of Birth: Feb 15, 1946

## 2015-12-15 ENCOUNTER — Ambulatory Visit: Payer: Commercial Managed Care - HMO | Admitting: Physical Therapy

## 2015-12-15 DIAGNOSIS — R2689 Other abnormalities of gait and mobility: Secondary | ICD-10-CM | POA: Diagnosis not present

## 2015-12-15 DIAGNOSIS — R55 Syncope and collapse: Secondary | ICD-10-CM | POA: Diagnosis not present

## 2015-12-15 DIAGNOSIS — R262 Difficulty in walking, not elsewhere classified: Secondary | ICD-10-CM

## 2015-12-15 DIAGNOSIS — M6281 Muscle weakness (generalized): Secondary | ICD-10-CM

## 2015-12-15 NOTE — Therapy (Signed)
[facility] [address] [place], , [address] Phone: [phone]   Fax:  [phone]  Physical Therapy Treatment  Patient Details  Name: Tiffany Hawkins MRN: [id number] Date of Birth: 09/20/1945 Referring Provider: Heather Boscia  Encounter Date: 12/15/2015      PT End of Session - 12/15/15 1317    Visit Number 10   Number of Visits 16   Date for PT Re-Evaluation 01/10/16   Authorization - Visit Number 10   Authorization - Number of Visits 18   PT Start Time 1258   PT Stop Time 1357   PT Time Calculation (min) 59 min   Equipment Utilized During Treatment Gait belt   Activity Tolerance Patient tolerated treatment well;No increased pain   Behavior During Therapy WFL for tasks assessed/performed      Past Medical History:  Diagnosis Date  . Anxiety   . Colon cancer ([facility]) 10/12/2014   had chemo  . Diabetes mellitus without complication ([facility])   . Hyperlipidemia   . Hypertension   . Lateral epicondylitis   . Polio   . Sleep apnea     Past Surgical History:  Procedure Laterality Date  . APPENDECTOMY    . BACK SURGERY    . CERVICAL DISC SURGERY    . CHOLECYSTECTOMY    . COLON SURGERY    . COLONOSCOPY    . COLONOSCOPY WITH PROPOFOL N/A 05/17/2015   Procedure: COLONOSCOPY WITH PROPOFOL;  Surgeon: Robert T Elliott, MD;  Location: [facility] ENDOSCOPY;  Service: Endoscopy;  Laterality: N/A;  . ESOPHAGOGASTRODUODENOSCOPY    . HERNIA REPAIR    . PORT A CATH INJECTION ([facility] HX)    . TUBAL LIGATION      There were no vitals filed for this visit.      Subjective Assessment - 12/15/15 1312    Subjective Pt. reports no new complaints or falls at this time.  Pt. states her great-grandchild is due in 12 days and she really wants to be able to hold him.     Pertinent History Frequent falls; typically posterior   Limitations Walking;House hold activities   How long can you walk comfortably? 30 minutes   Patient Stated  Goals pt would like to be able to amb safely, reduce falls, and be able to care for her great grandchildren   Currently in Pain? No/denies      Objective:  Neuromuscular Re-ed: Forward reaching tasks with cones on waist height table (focus on turning to L/R), braiding balance tasks in //-bars (no UE assist)- CGA for safety with greater difficulty with lateral steps L to R,  5# wt. Holding/ carrying tasks (simulating carrying a baby)- marching/ walking, Tandem gait (forward/backwards), Sit to stands from green chair with focus on proper technique/ posture.  Walking outside with no assistive device focusing on step pattern/ posture/ balance (down ramp/curb).  There.ex.: Scifit L4.5-6 10 min. B UE/LE.  Discussed HEP.     Pt response for medical necessity: Pt benefits from LE strengthening/ balance tasks to promote safety and decrease fall risk.  Pt. Easily fatigued but progressing with fewer rest breaks.  Pt. Continues to require occasional UE assist/ touch reference with higher level balance tasks.         PT Long Term Goals - 12/08/15 1532      PT LONG TERM GOAL #1   Title Pt will improve MMT scores by 1/2 grade to promote functional strength gains   Baseline Strength: R/L hip   flexion 4+/4, knee extension 4+/4-, knee flexion 4+/3+, dorsiflexion 5/4, plantarflexion 5/4-. 8/23: hip add 4/4-, hip abd 4+/4-   Time 4   Period Weeks   Status Partially Met     PT LONG TERM GOAL #2   Title Pt will improve BERG score by 10 pts to promote safety with ambulation and functional tasks.   Baseline BERG 7/31: 36/56. 8/23: 52/56 with instability for set up   Time 4   Period Weeks   Status Achieved     PT LONG TERM GOAL #3   Title Pt will be able to perform tandem stance with LLE posterior for >30 sec to promote balance gains and safety with dynamic standing/reaching tasks   Baseline pt able to balance <5 sec with LLE posterior 8/23: pt able to achieve 30 sec 3/5 trials   Time 4   Period Weeks    Status Partially Met     PT LONG TERM GOAL #4   Title Pt will demonstrates safety with ambulation using least restrictive AD with no LOB with 10 minutes of overground walking.    Baseline pt with unsteady independent gait. 8/23: pt not interested in using AD; continues to amb indep with low endurance   Time 4   Period Weeks   Status Partially Met     PT LONG TERM GOAL #5   Title Pt will achieve SLS on BLE for >15 secs to promote safety with static/dynamic balance tasks and decrease fall risk   Baseline 8/23: pt able to achieve SLS on RLE >5 sec, LLE <5 sec   Time 4   Period Weeks   Status New            Plan - 12/15/15 1319    Clinical Impression Statement Pt. has difficulty with lateral walking/ L LE muscle control without use of UE assist.  Pt. will occasionally require UE assist in //-bars to correct balance/ prevent posterior lean or LOB.  Moderate B LE muscle fatigue after Scifit at end of tx. session.  SBA/CGA to safely ambulate from clinic to car.     Rehab Potential Fair   PT Frequency 2x / week   PT Duration 4 weeks   PT Treatment/Interventions ADLs/Self Care Home Management;Aquatic Therapy;Neuromuscular re-education;Balance training;Therapeutic exercise;Therapeutic activities;Functional mobility training;Stair training;Gait training;DME Instruction;Patient/family education;Manual techniques;Energy conservation   PT Next Visit Plan progress balance/strengthening tasks; glute/abd focus   Consulted and Agree with Plan of Care Patient      Patient will benefit from skilled therapeutic intervention in order to improve the following deficits and impairments:  Abnormal gait, Decreased endurance, Decreased activity tolerance, Decreased strength, Pain, Difficulty walking, Decreased mobility, Decreased balance, Decreased range of motion, Improper body mechanics, Postural dysfunction, Decreased safety awareness  Visit Diagnosis: Muscle weakness  Difficulty in walking, not  elsewhere classified  Abnormality of gait due to impairment of balance     Problem List Patient Active Problem List   Diagnosis Date Noted  . Colon cancer ([place]) 10/12/2014   Pura Spice, PT, DPT # [id number] Derrill Memo, SPT 12/15/2015, 2:06 PM  [facility] [address] [place], [place], [address] Phone: [phone]   Fax:  [phone]  Name: Ruthanne Mcneish MRN: [id number] Date of Birth: Mar 25, 1946

## 2015-12-21 ENCOUNTER — Encounter: Payer: Self-pay | Admitting: Physical Therapy

## 2015-12-21 ENCOUNTER — Ambulatory Visit: Payer: Commercial Managed Care - HMO | Admitting: Physical Therapy

## 2015-12-21 ENCOUNTER — Ambulatory Visit: Payer: Commercial Managed Care - HMO | Attending: Nurse Practitioner | Admitting: Physical Therapy

## 2015-12-21 DIAGNOSIS — R2689 Other abnormalities of gait and mobility: Secondary | ICD-10-CM | POA: Diagnosis not present

## 2015-12-21 DIAGNOSIS — R262 Difficulty in walking, not elsewhere classified: Secondary | ICD-10-CM | POA: Insufficient documentation

## 2015-12-21 DIAGNOSIS — M6281 Muscle weakness (generalized): Secondary | ICD-10-CM | POA: Insufficient documentation

## 2015-12-21 NOTE — Therapy (Signed)
[facility] [address] [place], [place], [address] Phone: [phone]   Fax:  [phone]  Physical Therapy Treatment  Patient Details  Name: Tiffany Hawkins MRN: [id number] Date of Birth: 1945/11/01 Referring Provider: Leretha Pol  Encounter Date: 12/21/2015      PT End of Session - 12/21/15 1314    Visit Number 11   Number of Visits 16   Date for PT Re-Evaluation 01/10/16   Authorization - Visit Number 11   Authorization - Number of Visits 18   PT Start Time 2505   PT Stop Time 3976   PT Time Calculation (min) 50 min   Equipment Utilized During Treatment Gait belt   Activity Tolerance Patient tolerated treatment well;Patient limited by fatigue   Behavior During Therapy [facility] for tasks assessed/performed      Past Medical History:  Diagnosis Date  . Anxiety   . Colon cancer ([place]) 10/12/2014   had chemo  . Diabetes mellitus without complication ([place])   . Hyperlipidemia   . Hypertension   . Lateral epicondylitis   . Polio   . Sleep apnea     Past Surgical History:  Procedure Laterality Date  . APPENDECTOMY    . BACK SURGERY    . CERVICAL DISC SURGERY    . CHOLECYSTECTOMY    . COLON SURGERY    . COLONOSCOPY    . COLONOSCOPY WITH PROPOFOL N/A 05/17/2015   Procedure: COLONOSCOPY WITH PROPOFOL;  Surgeon: Manya Silvas, MD;  Location: [facility] ENDOSCOPY;  Service: Endoscopy;  Laterality: N/A;  . ESOPHAGOGASTRODUODENOSCOPY    . HERNIA REPAIR    . PORT A CATH INJECTION ([place] HX)    . TUBAL LIGATION      There were no vitals filed for this visit.      Subjective Assessment - 12/21/15 1255    Subjective Pt reports that she has been feeling dizzy all day today. Reports that she has been having some difficulty keeping her BP under control lately.   Pertinent History Frequent falls; typically posterior   Limitations Walking;House hold activities   How long can you walk comfortably? 30 minutes   Patient  Stated Goals pt would like to be able to amb safely, reduce falls, and be able to care for her great grandchildren   Currently in Pain? No/denies      Objective:  Vitals: BP 125/74 following Sci Fit  Neuromuscular Re-ed: Static balance on air ex bicep curls x30 (#1), shoulder flexion x30 (#1), Lumbar rotation #1 x30 (loss of balance with R rotation; inversion of R foot), punches x45 (#1).   Therapeutic Exercise: SciFit 10 minutes Level 5 (warm up/no charge). Seated LAQ #2.5 RLE #1.5 LLE 2x20. Marching #2.5 RLE #1.5 LLE x30 ea. Resisted hamstring curls GTB RLE, RTB RLE.  Pt response for medical necessity: Pt experiencing increased dizziness this session; pt belief that it is secondary to stress. She demonstrates difficulty with ambulation, with increased tendency for UE support on walls/gym equipment and has difficulty with RLE balance tasks. RLE is typically more stable than LLE.        PT Long Term Goals - 12/08/15 1532      PT LONG TERM GOAL #1   Title Pt will improve MMT scores by 1/2 grade to promote functional strength gains   Baseline Strength: R/L hip flexion 4+/4, knee extension 4+/4-, knee flexion 4+/3+, dorsiflexion 5/4, plantarflexion 5/4-. 8/23: hip add 4/4-, hip abd 4+/4-   Time 4  Period Weeks   Status Partially Met     PT LONG TERM GOAL #2   Title Pt will improve BERG score by 10 pts to promote safety with ambulation and functional tasks.   Baseline BERG 7/31: 36/56. 8/23: 52/56 with instability for set up   Time 4   Period Weeks   Status Achieved     PT LONG TERM GOAL #3   Title Pt will be able to perform tandem stance with LLE posterior for >30 sec to promote balance gains and safety with dynamic standing/reaching tasks   Baseline pt able to balance <5 sec with LLE posterior 8/23: pt able to achieve 30 sec 3/5 trials   Time 4   Period Weeks   Status Partially Met     PT LONG TERM GOAL #4   Title Pt will demonstrates safety with ambulation using least  restrictive AD with no LOB with 10 minutes of overground walking.    Baseline pt with unsteady independent gait. 8/23: pt not interested in using AD; continues to amb indep with low endurance   Time 4   Period Weeks   Status Partially Met     PT LONG TERM GOAL #5   Title Pt will achieve SLS on BLE for >15 secs to promote safety with static/dynamic balance tasks and decrease fall risk   Baseline 8/23: pt able to achieve SLS on RLE >5 sec, LLE <5 sec   Time 4   Period Weeks   Status New               Plan - 12/21/15 1541    Clinical Impression Statement Pt is experiencing increased dizziness this session; demonstrates difficulty ambulating in straight line with several diversions/need for UE assist and tendency to hold on to walls/gym equipment etc. Pt with difficulty with R sided weight bearing today with tendency for R foot inversion during static balance tasks.    Rehab Potential Fair   PT Frequency 2x / week   PT Duration 4 weeks   PT Treatment/Interventions ADLs/Self Care Home Management;Aquatic Therapy;Neuromuscular re-education;Balance training;Therapeutic exercise;Therapeutic activities;Functional mobility training;Stair training;Gait training;DME Instruction;Patient/family education;Manual techniques;Energy conservation   PT Next Visit Plan progress balance/strengthening tasks; glute/abd focus   Consulted and Agree with Plan of Care Patient      Patient will benefit from skilled therapeutic intervention in order to improve the following deficits and impairments:  Abnormal gait, Decreased endurance, Decreased activity tolerance, Decreased strength, Pain, Difficulty walking, Decreased mobility, Decreased balance, Decreased range of motion, Improper body mechanics, Postural dysfunction, Decreased safety awareness  Visit Diagnosis: Muscle weakness  Difficulty in walking, not elsewhere classified  Abnormality of gait due to impairment of balance     Problem  List Patient Active Problem List   Diagnosis Date Noted  . Colon cancer ([place]) 10/12/2014   Pura Spice, PT, DPT # [phone] Derrill Memo, SPT 12/22/2015, 12:42 PM  [facility] [address] [place], [place], [address] Phone: [phone]   Fax:  [phone]  Name: Braylinn Gulden MRN: [id number] Date of Birth: [birth year]

## 2015-12-23 ENCOUNTER — Encounter: Payer: Self-pay | Admitting: Physical Therapy

## 2015-12-23 ENCOUNTER — Ambulatory Visit: Payer: Commercial Managed Care - HMO | Admitting: Physical Therapy

## 2015-12-23 DIAGNOSIS — R2689 Other abnormalities of gait and mobility: Secondary | ICD-10-CM | POA: Diagnosis not present

## 2015-12-23 DIAGNOSIS — M6281 Muscle weakness (generalized): Secondary | ICD-10-CM

## 2015-12-23 DIAGNOSIS — R262 Difficulty in walking, not elsewhere classified: Secondary | ICD-10-CM | POA: Diagnosis not present

## 2015-12-23 NOTE — Therapy (Signed)
[facility] [address] [place], [place], [address] Phone: [phone]   Fax:  [phone]  Physical Therapy Treatment  Patient Details  Name: Tiffany Hawkins MRN: [id number] Date of Birth: 03-Aug-1945 Referring Provider: Leretha Pol  Encounter Date: 12/23/2015      PT End of Session - 12/23/15 1332    Visit Number 12   Number of Visits 16   Date for PT Re-Evaluation 01/10/16   Authorization - Visit Number 12   Authorization - Number of Visits 18   PT Start Time 3536   PT Stop Time 1424   PT Time Calculation (min) 58 min   Equipment Utilized During Treatment Gait belt   Activity Tolerance Patient tolerated treatment well;Patient limited by fatigue   Behavior During Therapy [facility] for tasks assessed/performed      Past Medical History:  Diagnosis Date  . Anxiety   . Colon cancer ([place]) 10/12/2014   had chemo  . Diabetes mellitus without complication ([place])   . Hyperlipidemia   . Hypertension   . Lateral epicondylitis   . Polio   . Sleep apnea     Past Surgical History:  Procedure Laterality Date  . APPENDECTOMY    . BACK SURGERY    . CERVICAL DISC SURGERY    . CHOLECYSTECTOMY    . COLON SURGERY    . COLONOSCOPY    . COLONOSCOPY WITH PROPOFOL N/A 05/17/2015   Procedure: COLONOSCOPY WITH PROPOFOL;  Surgeon: Manya Silvas, MD;  Location: [facility] ENDOSCOPY;  Service: Endoscopy;  Laterality: N/A;  . ESOPHAGOGASTRODUODENOSCOPY    . HERNIA REPAIR    . PORT A CATH INJECTION ([place] HX)    . TUBAL LIGATION      There were no vitals filed for this visit.      Subjective Assessment - 12/23/15 1331    Subjective Pt reports that she is feeling more stable on her feet today; states she has not had a fall.    Pertinent History Frequent falls; typically posterior   Limitations Walking;House hold activities   How long can you walk comfortably? 30 minutes   Patient Stated Goals pt would like to be able to amb safely,  reduce falls, and be able to care for her great grandchildren   Currently in Pain? No/denies      Objective:  Therapeutic Exercise: SciFit 10 minutes Level 5 (warm up/no charge). In // bars with SPT CGA: Step ups/down on 6'' step with no UE support (progressing from fingertip assist to no UE assist) x30. Lateral step ups on 6'' step with no UE support x30. Step up/down on 2'' air ex pad with no UE assist x30, lateral steps on 2'' air ex pad with no UE assist x30.   Gait: Overground walking; curb navigation, hills, uneven terrain, stairs. Demonstrates mild difficulty with foot clearance after several minutes secondary to fatigue. No LOB, mild scuffing of bilateral LE with fatigue.  Pt response for medical necessity: Pt requires several seated rest breaks this session. She demonstrates difficulty stepping onto unstable surface with problems with decreased foot clearance bilaterally.          PT Long Term Goals - 12/08/15 1532      PT LONG TERM GOAL #1   Title Pt will improve MMT scores by 1/2 grade to promote functional strength gains   Baseline Strength: R/L hip flexion 4+/4, knee extension 4+/4-, knee flexion 4+/3+, dorsiflexion 5/4, plantarflexion 5/4-. 8/23: hip add 4/4-, hip abd  4+/4-   Time 4   Period Weeks   Status Partially Met     PT LONG TERM GOAL #2   Title Pt will improve BERG score by 10 pts to promote safety with ambulation and functional tasks.   Baseline BERG 7/31: 36/56. 8/23: 52/56 with instability for set up   Time 4   Period Weeks   Status Achieved     PT LONG TERM GOAL #3   Title Pt will be able to perform tandem stance with LLE posterior for >30 sec to promote balance gains and safety with dynamic standing/reaching tasks   Baseline pt able to balance <5 sec with LLE posterior 8/23: pt able to achieve 30 sec 3/5 trials   Time 4   Period Weeks   Status Partially Met     PT LONG TERM GOAL #4   Title Pt will demonstrates safety with ambulation using least  restrictive AD with no LOB with 10 minutes of overground walking.    Baseline pt with unsteady independent gait. 8/23: pt not interested in using AD; continues to amb indep with low endurance   Time 4   Period Weeks   Status Partially Met     PT LONG TERM GOAL #5   Title Pt will achieve SLS on BLE for >15 secs to promote safety with static/dynamic balance tasks and decrease fall risk   Baseline 8/23: pt able to achieve SLS on RLE >5 sec, LLE <5 sec   Time 4   Period Weeks   Status New               Plan - 12/23/15 1555    Clinical Impression Statement Pt demonstrates improved stability in ambulation this session; she is able to perform overground walking on uneven terrain with navigation of curbs, sloped pavement and outdoor stairs with single sided railing. Pt demonstrates most difficulty with stepping task on unstable surface with improved performance of step up/down on 6'' step with no UE support.   Rehab Potential Fair   PT Frequency 2x / week   PT Duration 4 weeks   PT Treatment/Interventions ADLs/Self Care Home Management;Aquatic Therapy;Neuromuscular re-education;Balance training;Therapeutic exercise;Therapeutic activities;Functional mobility training;Stair training;Gait training;DME Instruction;Patient/family education;Manual techniques;Energy conservation   PT Next Visit Plan progress balance/strengthening tasks; glute/abd focus   Consulted and Agree with Plan of Care Patient      Patient will benefit from skilled therapeutic intervention in order to improve the following deficits and impairments:  Abnormal gait, Decreased endurance, Decreased activity tolerance, Decreased strength, Pain, Difficulty walking, Decreased mobility, Decreased balance, Decreased range of motion, Improper body mechanics, Postural dysfunction, Decreased safety awareness  Visit Diagnosis: Muscle weakness  Difficulty in walking, not elsewhere classified  Abnormality of gait due to impairment of  balance     Problem List Patient Active Problem List   Diagnosis Date Noted  . Colon cancer ([place]) 10/12/2014   Pura Spice, PT, DPT # [id number] Mickel Baas Kiaria Quinnell SPT 12/23/2015, 4:04 PM  [facility] [address] [place], [place], [address] Phone: [phone]   Fax:  [phone]  Name: Tiffany Hawkins MRN: [id number] Date of Birth: Dec 21, 1945

## 2015-12-28 ENCOUNTER — Ambulatory Visit: Payer: Commercial Managed Care - HMO | Admitting: Physical Therapy

## 2015-12-28 ENCOUNTER — Encounter: Payer: Self-pay | Admitting: Physical Therapy

## 2015-12-28 DIAGNOSIS — M6281 Muscle weakness (generalized): Secondary | ICD-10-CM | POA: Diagnosis not present

## 2015-12-28 DIAGNOSIS — R2689 Other abnormalities of gait and mobility: Secondary | ICD-10-CM | POA: Diagnosis not present

## 2015-12-28 DIAGNOSIS — R262 Difficulty in walking, not elsewhere classified: Secondary | ICD-10-CM

## 2015-12-28 NOTE — Therapy (Signed)
[facility] [address] [place], [place], [address] Phone: [phone]   Fax:  [phone]  Physical Therapy Treatment  Patient Details  Name: Tiffany Hawkins MRN: [id number] Date of Birth: 03/02/1946 Referring Provider: Leretha Pol  Encounter Date: 12/28/2015      PT End of Session - 12/28/15 1455    Visit Number 13   Number of Visits 16   Date for PT Re-Evaluation 01/10/16   Authorization - Visit Number 13   Authorization - Number of Visits 18   PT Start Time 1324   PT Stop Time 1425   PT Time Calculation (min) 59 min   Equipment Utilized During Treatment Gait belt   Activity Tolerance Patient tolerated treatment well;Patient limited by fatigue   Behavior During Therapy [facility] for tasks assessed/performed      Past Medical History:  Diagnosis Date  . Anxiety   . Colon cancer ([place]) 10/12/2014   had chemo  . Diabetes mellitus without complication ([place])   . Hyperlipidemia   . Hypertension   . Lateral epicondylitis   . Polio   . Sleep apnea     Past Surgical History:  Procedure Laterality Date  . APPENDECTOMY    . BACK SURGERY    . CERVICAL DISC SURGERY    . CHOLECYSTECTOMY    . COLON SURGERY    . COLONOSCOPY    . COLONOSCOPY WITH PROPOFOL N/A 05/17/2015   Procedure: COLONOSCOPY WITH PROPOFOL;  Surgeon: Manya Silvas, MD;  Location: [facility] ENDOSCOPY;  Service: Endoscopy;  Laterality: N/A;  . ESOPHAGOGASTRODUODENOSCOPY    . HERNIA REPAIR    . PORT A CATH INJECTION ([place] HX)    . TUBAL LIGATION      There were no vitals filed for this visit.      Subjective Assessment - 12/28/15 1450    Subjective Pt states she is feeling discouraged with her balance/leg strength. States she has episodes of feeling "woozy" prior to experiencing a fall/LOB. Reports she has had several "almost falls" while walking to her daughter's house but has been able to use her husband for support.   Pertinent History Frequent  falls; typically posterior   Limitations Walking;House hold activities   How long can you walk comfortably? 30 minutes   Patient Stated Goals pt would like to be able to amb safely, reduce falls, and be able to care for her great grandchildren   Currently in Pain? No/denies     Objective:  Therapeutic Exercise: Focus on endurance base exercised this session secondary to pt tendency for falls/LOB when fatigued. Amb with rollator in clinic 3 minutes; cueing for upright posture, emphasis on reliance of LE for balance. Marching in // bars 3 minutes with unilateral UE support. 6 minutes 30 sec reciprocal UE diagonals/LE hip flexion. 8 minutes 44 sec lateral step with leading leg hip flexion. 11 minutes 33 secs step 3'' up on air ex (unstable surface).  Pt response for medical necessity: Pt requires several seated rest breaks following standing exercise. Prior to onset of fatigue is able to perform all tasks with no/limited UE support; as pt fatigues she experiences increased LOB but able to self-correct in // bars with UE support/SPT CGA/SBA for safety.       PT Long Term Goals - 12/08/15 1532      PT LONG TERM GOAL #1   Title Pt will improve MMT scores by 1/2 grade to promote functional strength gains   Baseline Strength: R/L  hip flexion 4+/4, knee extension 4+/4-, knee flexion 4+/3+, dorsiflexion 5/4, plantarflexion 5/4-. 8/23: hip add 4/4-, hip abd 4+/4-   Time 4   Period Weeks   Status Partially Met     PT LONG TERM GOAL #2   Title Pt will improve BERG score by 10 pts to promote safety with ambulation and functional tasks.   Baseline BERG 7/31: 36/56. 8/23: 52/56 with instability for set up   Time 4   Period Weeks   Status Achieved     PT LONG TERM GOAL #3   Title Pt will be able to perform tandem stance with LLE posterior for >30 sec to promote balance gains and safety with dynamic standing/reaching tasks   Baseline pt able to balance <5 sec with LLE posterior 8/23: pt able to  achieve 30 sec 3/5 trials   Time 4   Period Weeks   Status Partially Met     PT LONG TERM GOAL #4   Title Pt will demonstrates safety with ambulation using least restrictive AD with no LOB with 10 minutes of overground walking.    Baseline pt with unsteady independent gait. 8/23: pt not interested in using AD; continues to amb indep with low endurance   Time 4   Period Weeks   Status Partially Met     PT LONG TERM GOAL #5   Title Pt will achieve SLS on BLE for >15 secs to promote safety with static/dynamic balance tasks and decrease fall risk   Baseline 8/23: pt able to achieve SLS on RLE >5 sec, LLE <5 sec   Time 4   Period Weeks   Status New           Plan - 12/28/15 1512    Clinical Impression Statement PT session emphasis on standing endurance tasks with dynamic balance. Pt demonstrates good initial form/stability for all tasks but worsens with fatigue requiring increased UE assist and SPT assist to correct LOB. She continues to have difficulty with single leg stance tasks with tendency for quick swing phase during marching tasks.   Rehab Potential Fair   PT Frequency 2x / week   PT Duration 4 weeks   PT Treatment/Interventions ADLs/Self Care Home Management;Aquatic Therapy;Neuromuscular re-education;Balance training;Therapeutic exercise;Therapeutic activities;Functional mobility training;Stair training;Gait training;DME Instruction;Patient/family education;Manual techniques;Energy conservation   PT Next Visit Plan progress balance/strengthening tasks; glute/abd focus   Consulted and Agree with Plan of Care Patient      Patient will benefit from skilled therapeutic intervention in order to improve the following deficits and impairments:  Abnormal gait, Decreased endurance, Decreased activity tolerance, Decreased strength, Pain, Difficulty walking, Decreased mobility, Decreased balance, Decreased range of motion, Improper body mechanics, Postural dysfunction, Decreased safety  awareness  Visit Diagnosis: Muscle weakness  Difficulty in walking, not elsewhere classified  Abnormality of gait due to impairment of balance     Problem List Patient Active Problem List   Diagnosis Date Noted  . Colon cancer ([place]) 10/12/2014   Pura Spice, PT, DPT # [id number] Mickel Baas Ashtan Laton SPT 12/28/2015, 3:15 PM  [facility] [address] [place], [place], [address] Phone: [phone]   Fax:  [phone]  Name: Tiffany Hawkins MRN: [id number] Date of Birth: [birth year]

## 2015-12-30 ENCOUNTER — Ambulatory Visit: Payer: Commercial Managed Care - HMO | Admitting: Physical Therapy

## 2015-12-30 ENCOUNTER — Encounter: Payer: Self-pay | Admitting: Physical Therapy

## 2015-12-30 DIAGNOSIS — R2689 Other abnormalities of gait and mobility: Secondary | ICD-10-CM

## 2015-12-30 DIAGNOSIS — R110 Nausea: Secondary | ICD-10-CM | POA: Diagnosis not present

## 2015-12-30 DIAGNOSIS — M6281 Muscle weakness (generalized): Secondary | ICD-10-CM | POA: Diagnosis not present

## 2015-12-30 DIAGNOSIS — E1165 Type 2 diabetes mellitus with hyperglycemia: Secondary | ICD-10-CM | POA: Diagnosis not present

## 2015-12-30 DIAGNOSIS — R262 Difficulty in walking, not elsewhere classified: Secondary | ICD-10-CM

## 2015-12-30 DIAGNOSIS — G14 Postpolio syndrome: Secondary | ICD-10-CM | POA: Diagnosis not present

## 2015-12-30 NOTE — Therapy (Signed)
[facility] [address] [place], [place], [address] Phone: [phone]   Fax:  [phone]  Physical Therapy Treatment  Patient Details  Name: Tiffany Hawkins MRN: [id number] Date of Birth: 04-25-1945 Referring Provider: Leretha Pol  Encounter Date: 12/30/2015      PT End of Session - 12/30/15 1358    Visit Number 14   Number of Visits 16   Date for PT Re-Evaluation 01/10/16   Authorization - Visit Number 14   Authorization - Number of Visits 18   PT Start Time 1300   PT Stop Time 1275   PT Time Calculation (min) 55 min   Equipment Utilized During Treatment Gait belt   Activity Tolerance Patient tolerated treatment well;Patient limited by fatigue   Behavior During Therapy [facility] for tasks assessed/performed      Past Medical History:  Diagnosis Date  . Anxiety   . Colon cancer ([place]) 10/12/2014   had chemo  . Diabetes mellitus without complication ([place])   . Hyperlipidemia   . Hypertension   . Lateral epicondylitis   . Polio   . Sleep apnea     Past Surgical History:  Procedure Laterality Date  . APPENDECTOMY    . BACK SURGERY    . CERVICAL DISC SURGERY    . CHOLECYSTECTOMY    . COLON SURGERY    . COLONOSCOPY    . COLONOSCOPY WITH PROPOFOL N/A 05/17/2015   Procedure: COLONOSCOPY WITH PROPOFOL;  Surgeon: Manya Silvas, MD;  Location: [facility] ENDOSCOPY;  Service: Endoscopy;  Laterality: N/A;  . ESOPHAGOGASTRODUODENOSCOPY    . HERNIA REPAIR    . PORT A CATH INJECTION ([place] HX)    . TUBAL LIGATION      There were no vitals filed for this visit.      Subjective Assessment - 12/30/15 1358    Subjective Subjective: pt arrives to PT session wearing flip flops; states that she was busy on her way over and was not able to get home in time to pick up her gym shoes. Says that she was sore following her last PT appointment but muscle soreness has resolved by today.   Pertinent History Frequent falls; typically  posterior   Limitations Walking;House hold activities   How long can you walk comfortably? 30 minutes   Patient Stated Goals pt would like to be able to amb safely, reduce falls, and be able to care for her great grandchildren   Currently in Pain? No/denies     Objective:  Neuromuscular Re-ed: Cone tap 4 cones bilat LE as stance leg (16 minutes). Ball catch outside of base of support (5 minutes). Cone reach outside of base of support bilat UE (5 minutes). Marching on air ex pad (11 minutes).   SciFit 10 minutes Level 6.0 (no charge)  Pt response for medical necessity: Pt requires one seated rest break this session after 21 continued minutes of exercise. She has most difficulty with marching on air ex pad; experiences increased LOB as she begins to fatigue.      PT Long Term Goals - 12/08/15 1532      PT LONG TERM GOAL #1   Title Pt will improve MMT scores by 1/2 grade to promote functional strength gains   Baseline Strength: R/L hip flexion 4+/4, knee extension 4+/4-, knee flexion 4+/3+, dorsiflexion 5/4, plantarflexion 5/4-. 8/23: hip add 4/4-, hip abd 4+/4-   Time 4   Period Weeks   Status Partially Met  PT LONG TERM GOAL #2   Title Pt will improve BERG score by 10 pts to promote safety with ambulation and functional tasks.   Baseline BERG 7/31: 36/56. 8/23: 52/56 with instability for set up   Time 4   Period Weeks   Status Achieved     PT LONG TERM GOAL #3   Title Pt will be able to perform tandem stance with LLE posterior for >30 sec to promote balance gains and safety with dynamic standing/reaching tasks   Baseline pt able to balance <5 sec with LLE posterior 8/23: pt able to achieve 30 sec 3/5 trials   Time 4   Period Weeks   Status Partially Met     PT LONG TERM GOAL #4   Title Pt will demonstrates safety with ambulation using least restrictive AD with no LOB with 10 minutes of overground walking.    Baseline pt with unsteady independent gait. 8/23: pt not  interested in using AD; continues to amb indep with low endurance   Time 4   Period Weeks   Status Partially Met     PT LONG TERM GOAL #5   Title Pt will achieve SLS on BLE for >15 secs to promote safety with static/dynamic balance tasks and decrease fall risk   Baseline 8/23: pt able to achieve SLS on RLE >5 sec, LLE <5 sec   Time 4   Period Weeks   Status New               Plan - 12/30/15 1358    Clinical Impression Statement Pt experienced moderate muscle soreness following last session but is able to perform all requested exercises today with increased standing tolerance for more static/balance oriented tasks.    Rehab Potential Fair   PT Frequency 2x / week   PT Duration 4 weeks   PT Treatment/Interventions ADLs/Self Care Home Management;Aquatic Therapy;Neuromuscular re-education;Balance training;Therapeutic exercise;Therapeutic activities;Functional mobility training;Stair training;Gait training;DME Instruction;Patient/family education;Manual techniques;Energy conservation   PT Next Visit Plan progress balance/strengthening tasks; glute/abd focus   Consulted and Agree with Plan of Care Patient      Patient will benefit from skilled therapeutic intervention in order to improve the following deficits and impairments:  Abnormal gait, Decreased endurance, Decreased activity tolerance, Decreased strength, Pain, Difficulty walking, Decreased mobility, Decreased balance, Decreased range of motion, Improper body mechanics, Postural dysfunction, Decreased safety awareness  Visit Diagnosis: Muscle weakness  Difficulty in walking, not elsewhere classified  Abnormality of gait due to impairment of balance     Problem List Patient Active Problem List   Diagnosis Date Noted  . Colon cancer ([place]) 10/12/2014   Pura Spice, PT, DPT # [id number] Mickel Baas Layan Zalenski SPT 12/30/2015, 3:27 PM  [facility] [address] [place], [place],  [address] Phone: [phone]   Fax:  [phone]  Name: Tiffany Hawkins MRN: [id number] Date of Birth: 08/19/1945

## 2016-01-03 ENCOUNTER — Ambulatory Visit: Payer: Commercial Managed Care - HMO | Admitting: Physical Therapy

## 2016-01-06 ENCOUNTER — Ambulatory Visit: Payer: Commercial Managed Care - HMO | Admitting: Physical Therapy

## 2016-01-06 ENCOUNTER — Encounter: Payer: Self-pay | Admitting: Physical Therapy

## 2016-01-06 DIAGNOSIS — R262 Difficulty in walking, not elsewhere classified: Secondary | ICD-10-CM | POA: Diagnosis not present

## 2016-01-06 DIAGNOSIS — R2689 Other abnormalities of gait and mobility: Secondary | ICD-10-CM

## 2016-01-06 DIAGNOSIS — M6281 Muscle weakness (generalized): Secondary | ICD-10-CM | POA: Diagnosis not present

## 2016-01-06 NOTE — Therapy (Signed)
[place] [facility] [address] [place], [place], [address] Phone: [phone]   Fax:  [phone]  Physical Therapy Treatment/Discharge  Patient Details  Name: Tiffany Hawkins MRN: [id number] Date of Birth: 12/25/1945 Referring Provider: Leretha Pol  Encounter Date: 01/06/2016      PT End of Session - 01/06/16 1504    Visit Number 15   Number of Visits 16   Date for PT Re-Evaluation 01/10/16   Authorization - Visit Number 15   Authorization - Number of Visits 18   PT Start Time 6314   PT Stop Time 1449   PT Time Calculation (min) 52 min   Equipment Utilized During Treatment Gait belt   Activity Tolerance Patient tolerated treatment well   Behavior During Therapy [facility] for tasks assessed/performed      Past Medical History:  Diagnosis Date  . Anxiety   . Colon cancer ([place]) 10/12/2014   had chemo  . Diabetes mellitus without complication ([place])   . Hyperlipidemia   . Hypertension   . Lateral epicondylitis   . Polio   . Sleep apnea     Past Surgical History:  Procedure Laterality Date  . APPENDECTOMY    . BACK SURGERY    . CERVICAL DISC SURGERY    . CHOLECYSTECTOMY    . COLON SURGERY    . COLONOSCOPY    . COLONOSCOPY WITH PROPOFOL N/A 05/17/2015   Procedure: COLONOSCOPY WITH PROPOFOL;  Surgeon: Manya Silvas, MD;  Location: [facility] ENDOSCOPY;  Service: Endoscopy;  Laterality: N/A;  . ESOPHAGOGASTRODUODENOSCOPY    . HERNIA REPAIR    . PORT A CATH INJECTION ([place] HX)    . TUBAL LIGATION      There were no vitals filed for this visit.      Subjective Assessment - 01/06/16 1502    Subjective Pt states she is feeling stable and that physical therapy has helped her. 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    Pertinent History Frequent falls; typically posterior   Limitations Walking;House hold activities   How long can you walk  comfortably? 30 minutes   Patient Stated Goals pt would like to be able to amb safely, reduce falls, and be able to care for her great grandchildren   Currently in Pain? No/denies     Objective:  Neuromusuclar Re-ed: In // bars with SPT SBA: Amb with 9# weight 5 min. 5 minutes over 2 2'' wooden plank and air ex pad. 5 minutes lateral step over 2 2'' plank and air ex pad. 5 minutes forward step over 2 2'' wooden plank and air ex pad with 9#. Tandem stance RLE/LLE x5 30 sec. SLS 30 sec RLE x3, 5-10 sec LLE.  Pt response for medical necessity: Pt demonstrates good tolerance to exercise today with seated rest breaks in between 5 minutes bouts. She is able to perform obstacle course exercise with 9# with mild difficulty but with no LOB/balance checks required. Pt feels that she is stable enough on her feet to discontinue PT at this time and progress with HEP to promote longterm functional mobility.       PT Long Term Goals - 01/06/16 1507      PT LONG TERM GOAL #1   Title Pt will improve MMT scores by 1/2 grade to promote functional strength gains   Baseline Strength: R/L hip flexion 4+/4, knee extension 4+/4-, knee flexion 4+/3+, dorsiflexion  5/4, plantarflexion 5/4-. 8/23: hip add 4/4-, hip abd 4+/4-   Time 4   Period Weeks   Status Partially Met     PT LONG TERM GOAL #2   Title Pt will improve BERG score by 10 pts to promote safety with ambulation and functional tasks.   Baseline BERG 7/31: 36/56. 8/23: 52/56 with instability for set up   Time 4   Period Weeks   Status Achieved     PT LONG TERM GOAL #3   Title Pt will be able to perform tandem stance with LLE posterior for >30 sec to promote balance gains and safety with dynamic standing/reaching tasks   Baseline pt able to balance <5 sec with LLE posterior 8/23: pt able to achieve 30 sec 3/5 trials 9/21: pt able to achieve 30 sec with RLE posterior; between 20-25 sec consistently with LLE posterior   Time 4   Period Weeks   Status  Partially Met     PT LONG TERM GOAL #4   Title Pt will demonstrates safety with ambulation using least restrictive AD with no LOB with 10 minutes of overground walking.    Baseline pt with unsteady independent gait. 8/23: pt not interested in using AD; continues to amb indep with low endurance 9/21: pt with episodes of instability but she is able to determine before exercise initiation; able to tolerate 10 minutes of exercise in clinic   Time 4   Period Weeks   Status Achieved     PT LONG TERM GOAL #5   Title Pt will achieve SLS on BLE for >15 secs to promote safety with static/dynamic balance tasks and decrease fall risk   Baseline 8/23: pt able to achieve SLS on RLE >5 sec, LLE <5 sec. 9/21: RLE >30 sec, LLE <10 sec.    Time 4   Period Weeks   Status Partially Met               Plan - 01/06/16 1505    Clinical Impression Statement Pt performs all neuromusuclar re-ed exercises with no LOB or required UE assist. Pt still limited with tandem balance and single leg tasks with RLE as primary stability leg. She is able to stand for 30 sec in tandem stance with RLE posterior and in SLS with RLE; <5 sec LLE SLS. Pt will progress to HEP program at this time to promote longterm mobility and f/u with PT as needed.   Rehab Potential Fair   PT Frequency 2x / week   PT Duration 4 weeks   PT Treatment/Interventions ADLs/Self Care Home Management;Aquatic Therapy;Neuromuscular re-education;Balance training;Therapeutic exercise;Therapeutic activities;Functional mobility training;Stair training;Gait training;DME Instruction;Patient/family education;Manual techniques;Energy conservation   PT Next Visit Plan progress balance/strengthening tasks; glute/abd focus   Consulted and Agree with Plan of Care Patient      Patient will benefit from skilled therapeutic intervention in order to improve the following deficits and impairments:  Abnormal gait, Decreased endurance, Decreased activity tolerance,  Decreased strength, Pain, Difficulty walking, Decreased mobility, Decreased balance, Decreased range of motion, Improper body mechanics, Postural dysfunction, Decreased safety awareness  Visit Diagnosis: Muscle weakness  Difficulty in walking, not elsewhere classified  Abnormality of gait due to impairment of balance     Problem List Patient Active Problem List   Diagnosis Date Noted  . Colon cancer ([place]) 10/12/2014   Pura Spice, PT, DPT # [id number] Mickel Baas Tyson Masin SPT 01/06/2016, 3:12 PM  [place] [facility] [address] [place],  [place], [address] Phone: [phone]   Fax:  [phone]  Name: Shrika Milos MRN: [id number] Date of Birth: 12/08/1945

## 2016-01-18 ENCOUNTER — Inpatient Hospital Stay: Payer: Commercial Managed Care - HMO | Attending: Oncology

## 2016-02-29 ENCOUNTER — Other Ambulatory Visit: Payer: Medicare HMO

## 2016-03-01 ENCOUNTER — Other Ambulatory Visit: Payer: Self-pay | Admitting: *Deleted

## 2016-03-01 DIAGNOSIS — C189 Malignant neoplasm of colon, unspecified: Secondary | ICD-10-CM

## 2016-03-02 NOTE — Progress Notes (Signed)
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  INTERVAL HISTORY: Patient returns to clinic today for repeat laboratory work and further evaluation. She has noticed increased nausea and vomiting over the past several months which has a slightly anxious since she states this is how she presented prior to her initial diagnosis. She otherwise feels well. She has no neurologic complaints. She denies any recent fevers. She has a good appetite and denies weight loss. She denies any chest pain or shortness of breath. She has no abdominal pain and is noted no changes in her bowel movements. She has no melena or hematochezia. Patient offers no further specific complaints today.  REVIEW OF SYSTEMS:   Review of Systems  Constitutional: Negative.  Negative for fever and malaise/fatigue.  Respiratory: Negative.   Cardiovascular: Negative.  Negative for chest pain and leg swelling.  Gastrointestinal: Positive for nausea and vomiting. Negative for abdominal pain, blood in stool, constipation, diarrhea and melena.  Genitourinary: Negative.  Negative for dysuria.  Musculoskeletal: Negative.   Neurological: Negative.  Negative for weakness.  Psychiatric/Behavioral: The patient is nervous/anxious.     As per HPI. Otherwise, a complete review of systems is negative.  PAST MEDICAL HISTORY: Past Medical History:  Diagnosis Date  . Anxiety   . Colon cancer ([place]) 10/12/2014   had chemo  . Diabetes mellitus without complication ([place])   . Hyperlipidemia   . Hypertension   . Lateral epicondylitis   . Polio   . Sleep apnea     PAST SURGICAL HISTORY: Past Surgical History:  Procedure Laterality Date  . APPENDECTOMY    . BACK SURGERY    . CERVICAL DISC  SURGERY    . CHOLECYSTECTOMY    . COLON SURGERY    . COLONOSCOPY    . COLONOSCOPY WITH PROPOFOL N/A 05/17/2015   Procedure: COLONOSCOPY WITH PROPOFOL;  Surgeon: Manya Silvas, MD;  Location[facility] ENDOSCOPY;  Service: Endoscopy;  Laterality: N/A;  . ESOPHAGOGASTRODUODENOSCOPY    . HERNIA REPAIR    . PORT A CATH INJECTION ([place] HX)    . TUBAL LIGATION      FAMILY HISTORY Family History  Problem Relation Age of Onset  . Alzheimer's disease Mother   . Heart disease Father   . Colon cancer Sister   . Breast cancer Cousin   . Breast cancer Cousin        ADVANCED DIRECTIVES:    HEALTH MAINTENANCE: Social History  Substance Use Topics  . Smoking status: Former Research scientist (life sciences)  . Smokeless tobacco: Never Used  . Alcohol use No    No Known Allergies  Current Outpatient Prescriptions  Medication Sig Dispense Refill  . citalopram (CELEXA) 20 MG tablet Take by mouth.    . Cyanocobalamin 2500 MCG SUBL Place 1,000 mcg under the tongue.     . fenofibrate 54 MG tablet Take by mouth.    . gabapentin (NEURONTIN) 300 MG capsule Take 300 mg by mouth at bedtime. Reported on 08/27/2015    . insulin aspart (NOVOLOG) 100 UNIT/ML injection Inject 10 Units into the skin daily.    Marland Kitchen lidocaine-prilocaine (EMLA) cream Apply 1 application topically as needed. 30 g 0  . Liraglutide (VICTOZA [place]) Inject into the skin.    Marland Kitchen lovastatin (MEVACOR) 20 MG tablet Take by mouth.    Marland Kitchen  Multiple Vitamin (MULTI-VITAMINS) TABS Take by mouth.    . ondansetron (ZOFRAN-ODT) 4 MG disintegrating tablet Take 4 mg by mouth once. Reported on 08/27/2015    . pantoprazole (PROTONIX) 20 MG tablet Take by mouth.    . polyethylene glycol powder (GLYCOLAX/MIRALAX) powder Take 1 Container by mouth once. Reported on 08/27/2015    . Vitamin D, Ergocalciferol, (DRISDOL) 50000 UNITS CAPS capsule Take by mouth.     No current facility-administered medications for this visit.     OBJECTIVE: Vitals:   03/03/16 1112  BP: (!) 156/92    Pulse: (!) 106  Temp: 97.6 F (36.4 C)     Body mass index is 27.13 kg/m.    ECOG FS:0 - Asymptomatic  General: Well-developed, well-nourished, no acute distress. Eyes: Pink conjunctiva, anicteric sclera. Lungs: Clear to auscultation bilaterally. Heart: Regular rate and rhythm. No rubs, murmurs, or gallops. Abdomen: Soft, nontender, nondistended. No organomegaly noted, normoactive bowel sounds. Musculoskeletal: No edema, cyanosis, or clubbing. Neuro: Alert, answering all questions appropriately. Cranial nerves grossly intact. Skin: No rashes or petechiae noted. Psych: Normal affect.   LAB RESULTS:  Lab Results  Component Value Date   NA 139 09/21/2015   K 4.2 09/21/2015   CL 104 09/21/2015   CO2 27 09/21/2015   GLUCOSE 133 (H) 09/21/2015   BUN 18 09/21/2015   CREATININE 0.88 09/21/2015   CALCIUM 9.2 09/21/2015   PROT 7.5 09/21/2015   ALBUMIN 4.6 09/21/2015   AST 28 09/21/2015   ALT 37 09/21/2015   ALKPHOS 48 09/21/2015   BILITOT 0.5 09/21/2015   GFRNONAA >60 09/21/2015   GFRAA >60 09/21/2015    Lab Results  Component Value Date   WBC 10.1 03/03/2016   NEUTROABS 6.6 (H) 03/03/2016   HGB 16.2 (H) 03/03/2016   HCT 47.8 (H) 03/03/2016   MCV 86.0 03/03/2016   PLT 198 03/03/2016   Lab Results  Component Value Date   CEA 2.3 08/27/2015     STUDIES: No results found.  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 She had a CT scan completed March 2016 which revealed no evidence of disease. Her CEA is pending for today. She had a colonoscopy on May 17, 2015 which was negative for malignancy. Given patient's nausea vomiting and was recommended to repeat CT scan. Patient does not wish to  proceed given financial concerns, but has agreed to imaging if her CEA increases or her systems continue to persist. Otherwise patient will return to clinic in 6 months with labs. 2. Nausea and vomiting: Unclear etiology, consider repeat imaging as above. 3. Polycythemia: Possibly some hemoconcentration secondary to mild dehydration.  Patient expressed understanding and was in agreement with this plan. She also understands that She can call clinic at any time with any questions, concerns, or complaints.   Colon cancer [facility])   Staging form: Colon and Rectum, AJCC 7th Edition     Clinical stage from 10/12/2011: Stage IIB (T4a, N0, M0) - Signed by Evlyn Kanner, NP on 10/12/2014   Lloyd Huger, MD   03/04/2016 8:14 AM

## 2016-03-03 ENCOUNTER — Inpatient Hospital Stay: Payer: Commercial Managed Care - HMO | Attending: Oncology | Admitting: Oncology

## 2016-03-03 ENCOUNTER — Other Ambulatory Visit: Payer: Self-pay | Admitting: *Deleted

## 2016-03-03 ENCOUNTER — Encounter: Payer: Self-pay | Admitting: Oncology

## 2016-03-03 ENCOUNTER — Inpatient Hospital Stay: Payer: Commercial Managed Care - HMO

## 2016-03-03 VITALS — BP 156/92 | HR 106 | Temp 97.6°F | Wt 145.9 lb

## 2016-03-03 DIAGNOSIS — Z800 Family history of malignant neoplasm of digestive organs: Secondary | ICD-10-CM | POA: Diagnosis not present

## 2016-03-03 DIAGNOSIS — E785 Hyperlipidemia, unspecified: Secondary | ICD-10-CM | POA: Diagnosis not present

## 2016-03-03 DIAGNOSIS — F419 Anxiety disorder, unspecified: Secondary | ICD-10-CM | POA: Diagnosis not present

## 2016-03-03 DIAGNOSIS — I10 Essential (primary) hypertension: Secondary | ICD-10-CM

## 2016-03-03 DIAGNOSIS — Z79899 Other long term (current) drug therapy: Secondary | ICD-10-CM | POA: Insufficient documentation

## 2016-03-03 DIAGNOSIS — Z803 Family history of malignant neoplasm of breast: Secondary | ICD-10-CM | POA: Diagnosis not present

## 2016-03-03 DIAGNOSIS — R112 Nausea with vomiting, unspecified: Secondary | ICD-10-CM | POA: Diagnosis not present

## 2016-03-03 DIAGNOSIS — Z9221 Personal history of antineoplastic chemotherapy: Secondary | ICD-10-CM | POA: Insufficient documentation

## 2016-03-03 DIAGNOSIS — E119 Type 2 diabetes mellitus without complications: Secondary | ICD-10-CM | POA: Insufficient documentation

## 2016-03-03 DIAGNOSIS — C182 Malignant neoplasm of ascending colon: Secondary | ICD-10-CM

## 2016-03-03 DIAGNOSIS — Z8612 Personal history of poliomyelitis: Secondary | ICD-10-CM

## 2016-03-03 DIAGNOSIS — C189 Malignant neoplasm of colon, unspecified: Secondary | ICD-10-CM

## 2016-03-03 DIAGNOSIS — G4700 Insomnia, unspecified: Secondary | ICD-10-CM | POA: Diagnosis not present

## 2016-03-03 DIAGNOSIS — Z794 Long term (current) use of insulin: Secondary | ICD-10-CM

## 2016-03-03 DIAGNOSIS — Z923 Personal history of irradiation: Secondary | ICD-10-CM | POA: Diagnosis not present

## 2016-03-03 DIAGNOSIS — Z85038 Personal history of other malignant neoplasm of large intestine: Secondary | ICD-10-CM | POA: Diagnosis present

## 2016-03-03 DIAGNOSIS — R110 Nausea: Secondary | ICD-10-CM | POA: Insufficient documentation

## 2016-03-03 DIAGNOSIS — M7710 Lateral epicondylitis, unspecified elbow: Secondary | ICD-10-CM | POA: Insufficient documentation

## 2016-03-03 DIAGNOSIS — Z9049 Acquired absence of other specified parts of digestive tract: Secondary | ICD-10-CM | POA: Diagnosis not present

## 2016-03-03 DIAGNOSIS — Z87891 Personal history of nicotine dependence: Secondary | ICD-10-CM | POA: Insufficient documentation

## 2016-03-03 LAB — CBC WITH DIFFERENTIAL/PLATELET
BASOS ABS: 0.1 10*3/uL (ref 0–0.1)
Basophils Relative: 1 %
Eosinophils Absolute: 0.2 10*3/uL (ref 0–0.7)
Eosinophils Relative: 2 %
HEMATOCRIT: 47.8 % — AB (ref 35.0–47.0)
HEMOGLOBIN: 16.2 g/dL — AB (ref 12.0–16.0)
LYMPHS PCT: 23 %
Lymphs Abs: 2.3 10*3/uL (ref 1.0–3.6)
MCH: 29 pg (ref 26.0–34.0)
MCHC: 33.8 g/dL (ref 32.0–36.0)
MCV: 86 fL (ref 80.0–100.0)
Monocytes Absolute: 0.9 10*3/uL (ref 0.2–0.9)
Monocytes Relative: 9 %
NEUTROS ABS: 6.6 10*3/uL — AB (ref 1.4–6.5)
Neutrophils Relative %: 65 %
Platelets: 198 10*3/uL (ref 150–440)
RBC: 5.57 MIL/uL — AB (ref 3.80–5.20)
RDW: 13.1 % (ref 11.5–14.5)
WBC: 10.1 10*3/uL (ref 3.6–11.0)

## 2016-03-03 MED ORDER — SODIUM CHLORIDE 0.9% FLUSH
10.0000 mL | INTRAVENOUS | Status: DC | PRN
  Administered 2016-03-03: 10 mL via INTRAVENOUS
  Filled 2016-03-03: qty 10

## 2016-03-03 MED ORDER — HEPARIN SOD (PORK) LOCK FLUSH 100 UNIT/ML IV SOLN
500.0000 [IU] | Freq: Once | INTRAVENOUS | Status: AC
  Administered 2016-03-03: 500 [IU] via INTRAVENOUS

## 2016-03-04 LAB — CEA: CEA: 2.5 ng/mL (ref 0.0–4.7)

## 2016-03-17 DIAGNOSIS — E119 Type 2 diabetes mellitus without complications: Secondary | ICD-10-CM | POA: Diagnosis not present

## 2016-04-14 ENCOUNTER — Inpatient Hospital Stay: Payer: Commercial Managed Care - HMO | Attending: Oncology

## 2016-04-14 DIAGNOSIS — Z85038 Personal history of other malignant neoplasm of large intestine: Secondary | ICD-10-CM | POA: Insufficient documentation

## 2016-04-14 DIAGNOSIS — Z95828 Presence of other vascular implants and grafts: Secondary | ICD-10-CM

## 2016-04-14 MED ORDER — HEPARIN SOD (PORK) LOCK FLUSH 100 UNIT/ML IV SOLN
500.0000 [IU] | Freq: Once | INTRAVENOUS | Status: AC
  Administered 2016-04-14: 500 [IU] via INTRAVENOUS

## 2016-04-14 MED ORDER — HEPARIN SOD (PORK) LOCK FLUSH 100 UNIT/ML IV SOLN
INTRAVENOUS | Status: AC
  Filled 2016-04-14: qty 5

## 2016-04-14 MED ORDER — SODIUM CHLORIDE 0.9% FLUSH
10.0000 mL | INTRAVENOUS | Status: DC | PRN
  Administered 2016-04-14: 10 mL via INTRAVENOUS
  Filled 2016-04-14: qty 10

## 2016-04-27 DIAGNOSIS — D519 Vitamin B12 deficiency anemia, unspecified: Secondary | ICD-10-CM | POA: Diagnosis not present

## 2016-04-27 DIAGNOSIS — I10 Essential (primary) hypertension: Secondary | ICD-10-CM | POA: Diagnosis not present

## 2016-04-27 DIAGNOSIS — E1165 Type 2 diabetes mellitus with hyperglycemia: Secondary | ICD-10-CM | POA: Diagnosis not present

## 2016-04-27 DIAGNOSIS — R2689 Other abnormalities of gait and mobility: Secondary | ICD-10-CM | POA: Diagnosis not present

## 2016-05-26 ENCOUNTER — Inpatient Hospital Stay: Payer: Commercial Managed Care - HMO | Attending: Oncology

## 2016-05-26 VITALS — BP 147/86 | HR 87 | Temp 97.5°F

## 2016-05-26 DIAGNOSIS — Z85038 Personal history of other malignant neoplasm of large intestine: Secondary | ICD-10-CM | POA: Insufficient documentation

## 2016-05-26 DIAGNOSIS — Z95828 Presence of other vascular implants and grafts: Secondary | ICD-10-CM

## 2016-05-26 DIAGNOSIS — Z452 Encounter for adjustment and management of vascular access device: Secondary | ICD-10-CM | POA: Diagnosis not present

## 2016-05-26 MED ORDER — HEPARIN SOD (PORK) LOCK FLUSH 100 UNIT/ML IV SOLN
500.0000 [IU] | Freq: Once | INTRAVENOUS | Status: AC
  Administered 2016-05-26: 500 [IU] via INTRAVENOUS

## 2016-05-26 MED ORDER — HEPARIN SOD (PORK) LOCK FLUSH 100 UNIT/ML IV SOLN
INTRAVENOUS | Status: AC
  Filled 2016-05-26: qty 5

## 2016-05-26 MED ORDER — SODIUM CHLORIDE 0.9% FLUSH
10.0000 mL | INTRAVENOUS | Status: DC | PRN
  Administered 2016-05-26: 10 mL via INTRAVENOUS
  Filled 2016-05-26: qty 10

## 2016-06-05 DIAGNOSIS — R2689 Other abnormalities of gait and mobility: Secondary | ICD-10-CM | POA: Diagnosis not present

## 2016-06-05 DIAGNOSIS — G14 Postpolio syndrome: Secondary | ICD-10-CM | POA: Diagnosis not present

## 2016-06-05 DIAGNOSIS — E1165 Type 2 diabetes mellitus with hyperglycemia: Secondary | ICD-10-CM | POA: Diagnosis not present

## 2016-06-22 ENCOUNTER — Emergency Department: Payer: Medicare HMO

## 2016-06-22 ENCOUNTER — Emergency Department
Admission: EM | Admit: 2016-06-22 | Discharge: 2016-06-22 | Disposition: A | Discharge status: Home / Self Care | Payer: Medicare HMO | Attending: Emergency Medicine | Admitting: Emergency Medicine

## 2016-06-22 ENCOUNTER — Encounter: Payer: Self-pay | Admitting: Emergency Medicine

## 2016-06-22 DIAGNOSIS — E119 Type 2 diabetes mellitus without complications: Secondary | ICD-10-CM | POA: Insufficient documentation

## 2016-06-22 DIAGNOSIS — Z794 Long term (current) use of insulin: Secondary | ICD-10-CM | POA: Insufficient documentation

## 2016-06-22 DIAGNOSIS — J189 Pneumonia, unspecified organism: Secondary | ICD-10-CM

## 2016-06-22 DIAGNOSIS — I10 Essential (primary) hypertension: Secondary | ICD-10-CM | POA: Diagnosis not present

## 2016-06-22 DIAGNOSIS — R1030 Lower abdominal pain, unspecified: Secondary | ICD-10-CM | POA: Insufficient documentation

## 2016-06-22 DIAGNOSIS — Z79899 Other long term (current) drug therapy: Secondary | ICD-10-CM | POA: Diagnosis not present

## 2016-06-22 DIAGNOSIS — R109 Unspecified abdominal pain: Secondary | ICD-10-CM | POA: Diagnosis not present

## 2016-06-22 DIAGNOSIS — R05 Cough: Secondary | ICD-10-CM | POA: Diagnosis present

## 2016-06-22 DIAGNOSIS — Z85038 Personal history of other malignant neoplasm of large intestine: Secondary | ICD-10-CM | POA: Diagnosis not present

## 2016-06-22 DIAGNOSIS — M549 Dorsalgia, unspecified: Secondary | ICD-10-CM | POA: Diagnosis not present

## 2016-06-22 DIAGNOSIS — R079 Chest pain, unspecified: Secondary | ICD-10-CM | POA: Diagnosis not present

## 2016-06-22 DIAGNOSIS — Z87891 Personal history of nicotine dependence: Secondary | ICD-10-CM | POA: Insufficient documentation

## 2016-06-22 DIAGNOSIS — R1084 Generalized abdominal pain: Secondary | ICD-10-CM | POA: Diagnosis not present

## 2016-06-22 DIAGNOSIS — J9811 Atelectasis: Secondary | ICD-10-CM | POA: Diagnosis not present

## 2016-06-22 LAB — GLUCOSE, CAPILLARY: GLUCOSE-CAPILLARY: 130 mg/dL — AB (ref 65–99)

## 2016-06-22 LAB — COMPREHENSIVE METABOLIC PANEL
ALBUMIN: 4.4 g/dL (ref 3.5–5.0)
ALK PHOS: 51 U/L (ref 38–126)
ALT: 31 U/L (ref 14–54)
AST: 21 U/L (ref 15–41)
Anion gap: 7 (ref 5–15)
BILIRUBIN TOTAL: 0.6 mg/dL (ref 0.3–1.2)
BUN: 24 mg/dL — AB (ref 6–20)
CALCIUM: 8.9 mg/dL (ref 8.9–10.3)
CO2: 28 mmol/L (ref 22–32)
CREATININE: 0.91 mg/dL (ref 0.44–1.00)
Chloride: 107 mmol/L (ref 101–111)
GFR calc Af Amer: >60 mL/min (ref >60)
GFR calc non Af Amer: >60 mL/min (ref >60)
GLUCOSE: 134 mg/dL — AB (ref 65–99)
Potassium: 4.3 mmol/L (ref 3.5–5.1)
Sodium: 142 mmol/L (ref 135–145)
TOTAL PROTEIN: 7.7 g/dL (ref 6.5–8.1)

## 2016-06-22 LAB — URINALYSIS, COMPLETE (UACMP) WITH MICROSCOPIC
BACTERIA UA: NONE SEEN
Bilirubin Urine: NEGATIVE
GLUCOSE, UA: NEGATIVE mg/dL
HGB URINE DIPSTICK: NEGATIVE
Ketones, ur: NEGATIVE mg/dL
NITRITE: NEGATIVE
Protein, ur: 100 mg/dL — AB
SPECIFIC GRAVITY, URINE: 1.024 (ref 1.005–1.030)
pH: 5 (ref 5.0–8.0)

## 2016-06-22 LAB — CBC
HCT: 45.5 % (ref 35.0–47.0)
Hemoglobin: 15.4 g/dL (ref 12.0–16.0)
MCH: 29.4 pg (ref 26.0–34.0)
MCHC: 33.9 g/dL (ref 32.0–36.0)
MCV: 86.8 fL (ref 80.0–100.0)
Platelets: 191 10*3/uL (ref 150–440)
RBC: 5.24 MIL/uL — ABNORMAL HIGH (ref 3.80–5.20)
RDW: 13.3 % (ref 11.5–14.5)
WBC: 9.6 10*3/uL (ref 3.6–11.0)

## 2016-06-22 LAB — TROPONIN I: TROPONIN I: 0.03 ng/mL — AB (ref <0.03)

## 2016-06-22 LAB — LIPASE, BLOOD: Lipase: 23 U/L (ref 11–51)

## 2016-06-22 MED ORDER — ONDANSETRON HCL 4 MG/2ML IJ SOLN
4.0000 mg | Freq: Once | INTRAMUSCULAR | Status: AC
  Administered 2016-06-22: 4 mg via INTRAVENOUS
  Filled 2016-06-22: qty 2

## 2016-06-22 MED ORDER — SODIUM CHLORIDE 0.9 % IV BOLUS (SEPSIS)
1000.0000 mL | Freq: Once | INTRAVENOUS | Status: AC
  Administered 2016-06-22: 1000 mL via INTRAVENOUS

## 2016-06-22 MED ORDER — MORPHINE SULFATE (PF) 4 MG/ML IV SOLN
4.0000 mg | Freq: Once | INTRAVENOUS | Status: AC
  Administered 2016-06-22: 4 mg via INTRAVENOUS
  Filled 2016-06-22: qty 1

## 2016-06-22 MED ORDER — IOPAMIDOL (ISOVUE-300) INJECTION 61%
30.0000 mL | Freq: Once | INTRAVENOUS | Status: AC | PRN
  Administered 2016-06-22: 30 mL via ORAL

## 2016-06-22 MED ORDER — IOPAMIDOL (ISOVUE-300) INJECTION 61%
100.0000 mL | Freq: Once | INTRAVENOUS | Status: AC | PRN
Start: 2016-06-22 — End: 2016-06-22
  Administered 2016-06-22: 100 mL via INTRAVENOUS

## 2016-06-22 MED ORDER — TRAMADOL HCL 50 MG PO TABS: 50.0000 mg | ORAL_TABLET | Freq: Four times a day (QID) | ORAL | 0 refills | Status: DC | PRN

## 2016-06-22 MED ORDER — AZITHROMYCIN 250 MG PO TABS: ORAL_TABLET | ORAL | 0 refills | Status: AC

## 2016-06-22 NOTE — ED Notes (Signed)
Patient transported to CT 

## 2016-06-22 NOTE — Discharge Instructions (Signed)
Please take your antibiotic as prescribed and Ultram as needed for discomfort. Please call the number provided for GYN to arrange a follow-up appointment. You have been provided a copy of the CT scan please bring this to your GYN appointment. Return to the emergency department for any increased pain, fever, or any other symptom personally concerning to yourself.  Also has been discussed please arrange with her primary care doctor to have a repeat chest x-ray performed in 4 weeks to ensure resolution of your pneumonia.

## 2016-06-22 NOTE — ED Triage Notes (Signed)
Pt states upper abd pain radiating to back for "several weeks", pain is intermittent. Off and on n/v as well.

## 2016-06-22 NOTE — ED Provider Notes (Signed)
[facility] Emergency Department Provider Note  Time seen: 7:04 PM  I have reviewed the triage vital signs and the nursing notes.   HISTORY  Chief Complaint Abdominal Pain and Chest Pain (rib pain)    HPI Tiffany Hawkins is a 71 y.o. female with a past medical history of anxiety, colon cancer, diabetes, hypertension, hyperlipidemia, presents to the emergency department with abdominal discomfort. According to the patient for the past 2 weeks she has been expressing intermittent abdominal pain especially in the lower abdomen. She also states a mild cough the past one week denies sputum production. Denies chest pain. Denies fever. States she has had 1 or 2 episodes of loose stool denies nausea or vomiting. Denies dysuria. Currently describes abdominal pain as moderate 5/10 dull aching pain diffusely across her abdomen but more so in the lower abdomen.  Past Medical History:  Diagnosis Date  . Anxiety   . Colon cancer ([place]) 10/12/2014   had chemo  . Diabetes mellitus without complication ([place])   . Hyperlipidemia   . Hypertension   . Lateral epicondylitis   . Polio   . Sleep apnea     Patient Active Problem List   Diagnosis Date Noted  . Cancer of ascending colon ([place]) 10/12/2014    Past Surgical History:  Procedure Laterality Date  . APPENDECTOMY    . BACK SURGERY    . CERVICAL DISC SURGERY    . CHOLECYSTECTOMY    . COLON SURGERY    . COLONOSCOPY    . COLONOSCOPY WITH PROPOFOL N/A 05/17/2015   Procedure: COLONOSCOPY WITH PROPOFOL;  Surgeon: Manya Silvas, MD;  Location: [facility] ENDOSCOPY;  Service: Endoscopy;  Laterality: N/A;  . ESOPHAGOGASTRODUODENOSCOPY    . HERNIA REPAIR    . PORT A CATH INJECTION ([place] HX)    . TUBAL LIGATION      Prior to Admission medications   Medication Sig Start Date End Date Taking? Authorizing Provider  citalopram (CELEXA) 20 MG tablet Take by mouth.    Historical Provider, MD  Cyanocobalamin 2500 MCG SUBL  Place 1,000 mcg under the tongue.     Historical Provider, MD  fenofibrate 54 MG tablet Take by mouth.    Historical Provider, MD  gabapentin (NEURONTIN) 300 MG capsule Take 300 mg by mouth at bedtime. Reported on 08/27/2015    Historical Provider, MD  insulin aspart (NOVOLOG) 100 UNIT/ML injection Inject 10 Units into the skin daily.    Historical Provider, MD  lidocaine-prilocaine (EMLA) cream Apply 1 application topically as needed. 10/12/14   Evlyn Kanner, NP  Liraglutide (VICTOZA [place]) Inject into the skin.    Historical Provider, MD  lovastatin (MEVACOR) 20 MG tablet Take by mouth.    Historical Provider, MD  Multiple Vitamin (MULTI-VITAMINS) TABS Take by mouth.    Historical Provider, MD  ondansetron (ZOFRAN-ODT) 4 MG disintegrating tablet Take 4 mg by mouth once. Reported on 08/27/2015    Historical Provider, MD  pantoprazole (PROTONIX) 20 MG tablet Take by mouth.    Historical Provider, MD  polyethylene glycol powder (GLYCOLAX/MIRALAX) powder Take 1 Container by mouth once. Reported on 08/27/2015    Historical Provider, MD  Vitamin D, Ergocalciferol, (DRISDOL) 50000 UNITS CAPS capsule Take by mouth.    Historical Provider, MD    No Known Allergies  Family History  Problem Relation Age of Onset  . Alzheimer's disease Mother   . Heart disease Father   . Colon cancer Sister   . Breast cancer Cousin   .  Breast cancer Cousin     Social History Social History  Substance Use Topics  . Smoking status: Former Research scientist (life sciences)  . Smokeless tobacco: Never Used  . Alcohol use No    Review of Systems Constitutional: Negative for fever Cardiovascular: Negative for chest pain. Respiratory: Negative for shortness of breath. Gastrointestinal: Lower abdominal pain. Genitourinary: Negative for dysuria. Neurological: Negative for headache 10-point ROS otherwise negative.  ____________________________________________   PHYSICAL EXAM:  VITAL SIGNS: ED Triage Vitals  Enc Vitals Group     BP  06/22/16 1305 (!) 157/79     Pulse Rate 06/22/16 1305 87     Resp 06/22/16 1305 18     Temp 06/22/16 1305 98.1 F (36.7 C)     Temp Source 06/22/16 1305 Oral     SpO2 06/22/16 1305 98 %     Weight 06/22/16 1305 148 lb (67.1 kg)     Height 06/22/16 1305 5\' 1"  (1.549 m)     Head Circumference --      Peak Flow --      Pain Score 06/22/16 1310 4     Pain Loc --      Pain Edu? --      Excl. in [place]? --     Constitutional: Alert and oriented. Well appearing and in no distress. Eyes: Normal exam ENT   Head: Normocephalic and atraumatic   Mouth/Throat: Mucous membranes are moist. Cardiovascular: Normal rate, regular rhythm. No murmur Respiratory: Normal respiratory effort without tachypnea nor retractions. Breath sounds are clear  Gastrointestinal: Soft and nontender. No distention.  Musculoskeletal: Nontender with normal range of motion in all extremities. Neurologic:  Normal speech and language. No gross focal neurologic deficits Skin:  Skin is warm, dry and intact.  Psychiatric: Mood and affect are normal.   ____________________________________________    EKG  EKG reviewed and interpreted by myself as normal sinus rhythm at 92 bpm, narrow QRS, normal axis, normal intervals, no ST changes. Normal EKG.  ____________________________________________    RADIOLOGY  IMPRESSION: No acute bony abnormality of the right ribcage is observed. Hazy increased density in the right mid upper lung worrisome for atelectasis or early pneumonia. Followup PA and lateral chest X-ray is recommended in 3-4 weeks following trial of antibiotic therapy to ensure resolution and exclude underlying malignancy.  IMPRESSION: 1. Stable bibasilar subpleural pulmonary nodules since 2016 consistent benign findings. 2. Fatty infiltration of the liver. Status post cholecystectomy. 3. Simple bilateral renal cysts. 4. Engorgement of the periuterine vessels and left gonadal vein in keeping with pelvic  vascular congestion syndrome, a diagnosis of exclusion in the workup of pelvic pain. 5. Right hemicolectomy without focal abnormality at anastomosis. 6. Mild contrast and fluid-filled distention of proximal small bowel. An enteritis is not entirely excluded.    ____________________________________________   INITIAL IMPRESSION / ASSESSMENT AND PLAN / ED COURSE  Pertinent labs & imaging results that were available during my care of the patient were reviewed by me and considered in my medical decision making (see chart for details).  The patient presents to the emergency department with 2 weeks of intermittent abdominal pain which she states is getting worse, mostly located in the lower abdomen. Patient also states 1 week of cough although denies any fever or sputum production. Denies any chest pain. Patient's labs are largely within normal limits, troponin is negative. Urinalysis is largely normal, urine culture has been added on. CT scan of the abdomen/pelvis is largely within normal limits besides possible pelvic vascular congestion syndrome. I  discussed GYN follow-up with the patient. Given the patient's chest x-ray findings concerning for early pneumonia we will place on Zithromax, Ultram for abdominal discomfort and have her follow-up with GYN for further evaluation. Patient agreeable to this plan. Discussed with the patient the need for a repeat chest x-ray in 4 weeks to ensure resolution and to rule out malignancy.  ____________________________________________   FINAL CLINICAL IMPRESSION(S) / ED DIAGNOSES  Pneumonia Abdominal pain    Harvest Dark, MD 06/22/16 9323

## 2016-06-24 LAB — URINE CULTURE

## 2016-06-26 ENCOUNTER — Ambulatory Visit: Payer: Self-pay | Admitting: Obstetrics & Gynecology

## 2016-06-29 ENCOUNTER — Ambulatory Visit (INDEPENDENT_AMBULATORY_CARE_PROVIDER_SITE_OTHER): Payer: Medicare HMO | Admitting: Obstetrics & Gynecology

## 2016-06-29 ENCOUNTER — Encounter: Payer: Self-pay | Admitting: Obstetrics & Gynecology

## 2016-06-29 VITALS — BP 134/82 | HR 91 | Ht 62.0 in | Wt 150.0 lb

## 2016-06-29 DIAGNOSIS — N9489 Other specified conditions associated with female genital organs and menstrual cycle: Secondary | ICD-10-CM | POA: Insufficient documentation

## 2016-06-29 DIAGNOSIS — Z124 Encounter for screening for malignant neoplasm of cervix: Secondary | ICD-10-CM | POA: Diagnosis not present

## 2016-06-29 DIAGNOSIS — I862 Pelvic varices: Secondary | ICD-10-CM | POA: Insufficient documentation

## 2016-06-29 DIAGNOSIS — R1900 Intra-abdominal and pelvic swelling, mass and lump, unspecified site: Secondary | ICD-10-CM | POA: Diagnosis not present

## 2016-06-29 DIAGNOSIS — R1031 Right lower quadrant pain: Secondary | ICD-10-CM | POA: Diagnosis not present

## 2016-06-29 DIAGNOSIS — I899 Noninfective disorder of lymphatic vessels and lymph nodes, unspecified: Secondary | ICD-10-CM | POA: Diagnosis not present

## 2016-06-29 NOTE — Progress Notes (Signed)
HPI: Patient is a 71 y.o. [id number] who is postmenopausal, presents today for a problem visit.  She complains of recent findings of pelvic varices by CT - Pelvis.  Pt has had symptoms of pain.  Pain has been intermittant in RLQ (as most pain described is RUQ radiating to back).  RLQ pain is mild, no associated sx's, no modifiers, no context.  Some bloating that is off and on noted.  Previous evaluation: emergency room visit for RUQ pain is otherwise negative, although she does have a history of multiple surgeries for and after colon cancer. Prior Diagnosis: none.  No prior cyst, fibroid, PID, cesareans; has had BTL. Previous Treatment: none.  No lower extremity of vulvar varicosities noted.  PMHx: She  has a past medical history of Anxiety; Colon cancer ([place]) (10/12/2014); Diabetes mellitus without complication ([place]); Hyperlipidemia; Hypertension; Lateral epicondylitis; Polio; and Sleep apnea. Also,  has a past surgical history that includes Appendectomy; Cholecystectomy; Tubal ligation; Colon surgery; Cervical disc surgery; Hernia repair; PORT A CATH INJECTION ([facility] HX); Colonoscopy; Esophagogastroduodenoscopy; Back surgery; and Colonoscopy with propofol (N/A, 05/17/2015)., family history includes Alzheimer's disease in her mother; Breast cancer in her cousin and cousin; Colon cancer in her sister; Heart disease in her father.,  reports that she has quit smoking. She has never used smokeless tobacco. She reports that she does not drink alcohol or use drugs.  She has a current medication list which includes the following prescription(s): ondansetron, citalopram, cyanocobalamin, fenofibrate, fenofibrate, insulin aspart, insulin aspart, lidocaine-prilocaine, liraglutide, liraglutide, lovastatin, lovastatin, multi-vitamins, ondansetron, pantoprazole, and vitamin d (ergocalciferol). Also, has No Known Allergies.  Review of Systems  Constitutional: Negative for chills, fever and malaise/fatigue.  HENT:  Negative for congestion, sinus pain and sore throat.   Eyes: Negative for blurred vision and pain.  Respiratory: Negative for cough and wheezing.   Cardiovascular: Negative for chest pain and leg swelling.  Gastrointestinal: Negative for abdominal pain, constipation, diarrhea, heartburn, nausea and vomiting.  Genitourinary: Negative for dysuria, frequency, hematuria and urgency.  Musculoskeletal: Negative for back pain, joint pain, myalgias and neck pain.  Skin: Negative for itching and rash.  Neurological: Negative for dizziness, tremors and weakness.  Endo/Heme/Allergies: Does not bruise/bleed easily.  Psychiatric/Behavioral: Negative for depression. The patient is not nervous/anxious and does not have insomnia.     Objective: BP 134/82   Pulse 91   Ht 5\' 2"  (1.575 m)   Wt 150 lb (68 kg)   BMI 27.44 kg/m  Physical Exam  Constitutional: She is oriented to person, place, and time. She appears well-developed and well-nourished. No distress.  Genitourinary: Rectum normal, vagina normal and uterus normal. Pelvic exam was performed with patient supine. There is no rash or lesion on the right labia. There is no rash or lesion on the left labia. Vagina exhibits no lesion. No bleeding in the vagina. Right adnexum does not display mass and does not display tenderness. Left adnexum does not display mass and does not display tenderness. Cervix does not exhibit motion tenderness, lesion, friability or polyp.   Uterus is mobile and midaxial. Uterus is not enlarged or exhibiting a mass.  HENT:  Head: Normocephalic and atraumatic. Head is without laceration.  Right Ear: Hearing normal.  Left Ear: Hearing normal.  Neck: Normal range of motion. Neck supple. No thyromegaly present.  Cardiovascular: Normal rate and regular rhythm.  Exam reveals no gallop and no friction rub.   No murmur heard. Pulmonary/Chest: Effort normal and breath sounds normal. No respiratory distress. She has no  wheezes.    Abdominal: Soft. Bowel sounds are normal. She exhibits no distension. There is no tenderness. There is no rebound.  Musculoskeletal: Normal range of motion.  Neurological: She is alert and oriented to person, place, and time. No cranial nerve deficit.  Skin: Skin is warm and dry.  Psychiatric: She has a normal mood and affect. Judgment normal.  Vitals reviewed.   ASSESSMENT/PLAN:    Problem List Items Addressed This Visit      Pelvic varices     Incidental finding vs etiology for lower pelvic pain; [place] to further assess         Right lower quadrant pain - Primary   Relevant Orders   US Transvaginal Non-OB   CA 125    Other Visit Diagnoses    Screening for cervical cancer       Relevant Orders   IGP, Aptima HPV      Barnett Applebaum, MD, [facility], [facility] 06/29/2016  3:23 PM

## 2016-06-30 LAB — CA 125: CA 125: 10 U/mL (ref 0.0–38.1)

## 2016-07-03 LAB — IGP, APTIMA HPV
HPV APTIMA: NEGATIVE
PAP SMEAR COMMENT: 0

## 2016-07-04 NOTE — Progress Notes (Signed)
LM 07/04/16

## 2016-07-07 ENCOUNTER — Inpatient Hospital Stay: Payer: Medicare HMO | Attending: Oncology

## 2016-07-07 VITALS — Resp 18

## 2016-07-07 DIAGNOSIS — C189 Malignant neoplasm of colon, unspecified: Secondary | ICD-10-CM | POA: Insufficient documentation

## 2016-07-07 DIAGNOSIS — Z95828 Presence of other vascular implants and grafts: Secondary | ICD-10-CM

## 2016-07-07 DIAGNOSIS — C182 Malignant neoplasm of ascending colon: Secondary | ICD-10-CM

## 2016-07-07 LAB — CBC WITH DIFFERENTIAL/PLATELET
BASOS ABS: 0 10*3/uL (ref 0–0.1)
Basophils Relative: 0 %
Eosinophils Absolute: 0.3 10*3/uL (ref 0–0.7)
Eosinophils Relative: 4 %
HEMATOCRIT: 42 % (ref 35.0–47.0)
HEMOGLOBIN: 14.1 g/dL (ref 12.0–16.0)
Lymphocytes Relative: 29 %
Lymphs Abs: 2.5 10*3/uL (ref 1.0–3.6)
MCH: 28.7 pg (ref 26.0–34.0)
MCHC: 33.5 g/dL (ref 32.0–36.0)
MCV: 85.7 fL (ref 80.0–100.0)
Monocytes Absolute: 0.9 10*3/uL (ref 0.2–0.9)
Monocytes Relative: 10 %
NEUTROS PCT: 57 %
Neutro Abs: 5.1 10*3/uL (ref 1.4–6.5)
Platelets: 192 10*3/uL (ref 150–440)
RBC: 4.9 MIL/uL (ref 3.80–5.20)
RDW: 13.3 % (ref 11.5–14.5)
WBC: 8.8 10*3/uL (ref 3.6–11.0)

## 2016-07-07 MED ORDER — SODIUM CHLORIDE 0.9% FLUSH
10.0000 mL | INTRAVENOUS | Status: DC | PRN
  Administered 2016-07-07: 10 mL via INTRAVENOUS
  Filled 2016-07-07: qty 10

## 2016-07-07 MED ORDER — HEPARIN SOD (PORK) LOCK FLUSH 100 UNIT/ML IV SOLN
500.0000 [IU] | Freq: Once | INTRAVENOUS | Status: AC
  Administered 2016-07-07: 500 [IU] via INTRAVENOUS
  Filled 2016-07-07: qty 5

## 2016-07-07 NOTE — Patient Instructions (Signed)
Lab work

## 2016-07-08 LAB — CEA: CEA: 1.8 ng/mL (ref 0.0–4.7)

## 2016-07-14 ENCOUNTER — Encounter: Payer: Medicare HMO | Attending: Nurse Practitioner | Admitting: *Deleted

## 2016-07-14 ENCOUNTER — Encounter: Payer: Self-pay | Admitting: *Deleted

## 2016-07-14 VITALS — BP 144/84 | Ht 62.0 in | Wt 147.0 lb

## 2016-07-14 DIAGNOSIS — I10 Essential (primary) hypertension: Secondary | ICD-10-CM | POA: Diagnosis not present

## 2016-07-14 DIAGNOSIS — Z713 Dietary counseling and surveillance: Secondary | ICD-10-CM | POA: Diagnosis not present

## 2016-07-14 DIAGNOSIS — E119 Type 2 diabetes mellitus without complications: Secondary | ICD-10-CM | POA: Insufficient documentation

## 2016-07-14 NOTE — Patient Instructions (Addendum)
Check blood sugars 1 x day before breakfast or 2 hrs after supper every day  Exercise:  Begin walking for 10-15 minutes 3 days a week and gradually increase to 30 minutes 5 x week  Eat 3 meals day,  1 snack a day Space meals 4-6 hours apart Don't skips meals   Carry fast acting glucose and a snack at all times Rotate injection sites  Write down list of medications with name, dosage and frequency to keep in your purse  Call if you want to return for appointment with the dietitian or nurse

## 2016-07-14 NOTE — Progress Notes (Signed)
Diabetes Self-Management Education  Visit Type: First/Initial  Appt. Start Time: 1115 Appt. End Time: 0762  07/14/2016  [name], identified by name and date of birth, is a 71 y.o. female with a diagnosis of Diabetes: Type 2.   ASSESSMENT  Blood pressure (!) 144/84, height 5' 2"  (1.575 m), weight 147 lb (66.7 kg). Body mass index is 26.89 kg/m.      Diabetes Self-Management Education - 07/14/16 1311      Visit Information   Visit Type First/Initial     Initial Visit   Diabetes Type Type 2   Are you currently following a meal plan? No   Are you taking your medications as prescribed? Yes  "Pt stopped taking her NovoLog and Victoza for 4 months due to cost - she was in the donut hole" She is waiting on a prescription for Celexa from her MD office. She hasn't been taking that for several months.    Date Diagnosed "several years"     Health Coping   How would you rate your overall health? Good     Psychosocial Assessment   Patient Belief/Attitude about Diabetes Other (comment)  "mad"   Self-care barriers Other (comment)  Memory loss   Self-management support Doctor's office;Family   Other persons present Family Member  Daughter   Patient Concerns Nutrition/Meal planning;Medication;Glycemic Control;Weight Control;Monitoring   Special Needs None   Preferred Learning Style Auditory   Learning Readiness Ready   How often do you need to have someone help you when you read instructions, pamphlets, or other written materials from your doctor or pharmacy? 1 - Never   What is the last grade level you completed in school? 12th     Pre-Education Assessment   Patient understands the diabetes disease and treatment process. Needs Instruction   Patient understands incorporating nutritional management into lifestyle. Needs Instruction   Patient undertands incorporating physical activity into lifestyle. Needs Instruction   Patient understands using medications safely. Needs  Instruction   Patient understands monitoring blood glucose, interpreting and using results Needs Review   Patient understands prevention, detection, and treatment of acute complications. Needs Instruction   Patient understands prevention, detection, and treatment of chronic complications. Needs Instruction   Patient understands how to develop strategies to address psychosocial issues. Needs Instruction   Patient understands how to develop strategies to promote health/change behavior. Needs Instruction     Complications   Last HgB A1C per patient/outside source --  Pt can't remember her last A1C.    How often do you check your blood sugar? 1-2 times/day   Fasting Blood glucose range (mg/dL) 70-129;130-179  Pt reports FBG's 126-168 mg/dL. She reports 168 mg/dL this morning after having large portion of mac-n-cheese last night.    Have you had a dilated eye exam in the past 12 months? Yes   Have you had a dental exam in the past 12 months? Yes   Are you checking your feet? No     Dietary Intake   Breakfast bacon, sausage, country ham, eggs, toast, grilled chicken wtih cheese on hamburger bun   Lunch leftovers from supper   Dinner skips or has chicken, fish, slaw, green beans, mac-n-cheese   Beverage(s) water, coffee     Exercise   Exercise Type ADL's     Patient Education   Previous Diabetes Education Yes (please comment)  Pt can't remember but she "came to an appointment here for diabetes."   Disease state  Definition of diabetes, type 1 and  2, and the diagnosis of diabetes;Explored patient's options for treatment of their diabetes   Nutrition management  Role of diet in the treatment of diabetes and the relationship between the three main macronutrients and blood glucose level;Reviewed blood glucose goals for pre and post meals and how to evaluate the patients' food intake on their blood glucose level.;Meal timing in regards to the patients' current diabetes medication.   Physical  activity and exercise  Role of exercise on diabetes management, blood pressure control and cardiac health.   Medications Taught/reviewed insulin injection, site rotation, insulin storage and needle disposal.;Reviewed patients medication for diabetes, action, purpose, timing of dose and side effects.   Monitoring Purpose and frequency of SMBG.;Taught/discussed recording of test results and interpretation of SMBG.;Identified appropriate SMBG and/or A1C goals.   Acute complications Taught treatment of hypoglycemia - the 15 rule.   Chronic complications Relationship between chronic complications and blood glucose control   Psychosocial adjustment Role of stress on diabetes;Identified and addressed patients feelings and concerns about diabetes     Individualized Goals (developed by patient)   Reducing Risk Improve blood sugars Decrease medications Prevent diabetes complications Lose weight     Outcomes   Expected Outcomes Demonstrated interest in learning. Expect positive outcomes   Program Status Not Completed      Individualized Plan for Diabetes Self-Management Training:   Learning Objective:  Patient will have a greater understanding of diabetes self-management. Patient education plan is to attend individual and/or group sessions per assessed needs and concerns.   Plan:   Patient Instructions  Check blood sugars 1 x day before breakfast or 2 hrs after supper every day Exercise:  Begin walking for 10-15 minutes 3 days a week and gradually increase to 30 minutes 5 x week Eat 3 meals day,  1 snack a day Space meals 4-6 hours apart Don't skips meals  Carry fast acting glucose and a snack at all times Rotate injection sites Write down list of medications with name, dosage and frequency to keep in your purse Call if you want to return for appointment with the dietitian or nurse   Expected Outcomes:  Demonstrated interest in learning. Expect positive outcomes  Education material  provided: General Meal Planning Guidelines Simple Meal Plan Getting Started (Insulin) BD Symptoms, causes and treatments of Hypoglycemia  If problems or questions, patient to contact team via:  Johny Drilling, [place], [place], CDE [phone]  Future DSME appointment:  The patient has been to diabetes education in the past. She doesn't want to come to a follow up appointment at this time. She reports that she will work on her diet first. She was offered a return appointment with the dietitian or nurse.

## 2016-07-21 ENCOUNTER — Other Ambulatory Visit: Payer: Self-pay | Admitting: Nurse Practitioner

## 2016-07-21 ENCOUNTER — Ambulatory Visit
Admission: RE | Admit: 2016-07-21 | Discharge: 2016-07-21 | Disposition: A | Discharge status: Home / Self Care | Payer: Medicare HMO | Source: Ambulatory Visit | Attending: Nurse Practitioner | Admitting: Nurse Practitioner

## 2016-07-21 DIAGNOSIS — J189 Pneumonia, unspecified organism: Secondary | ICD-10-CM | POA: Insufficient documentation

## 2016-07-21 DIAGNOSIS — R0602 Shortness of breath: Secondary | ICD-10-CM

## 2016-07-21 DIAGNOSIS — I10 Essential (primary) hypertension: Secondary | ICD-10-CM | POA: Diagnosis not present

## 2016-07-21 DIAGNOSIS — H60512 Acute actinic otitis externa, left ear: Secondary | ICD-10-CM | POA: Diagnosis not present

## 2016-07-21 DIAGNOSIS — E1165 Type 2 diabetes mellitus with hyperglycemia: Secondary | ICD-10-CM | POA: Diagnosis not present

## 2016-07-21 DIAGNOSIS — F3341 Major depressive disorder, recurrent, in partial remission: Secondary | ICD-10-CM | POA: Diagnosis not present

## 2016-07-21 DIAGNOSIS — R110 Nausea: Secondary | ICD-10-CM | POA: Diagnosis not present

## 2016-07-21 DIAGNOSIS — M545 Low back pain: Secondary | ICD-10-CM | POA: Diagnosis not present

## 2016-07-21 DIAGNOSIS — J069 Acute upper respiratory infection, unspecified: Secondary | ICD-10-CM | POA: Diagnosis not present

## 2016-07-24 ENCOUNTER — Ambulatory Visit (INDEPENDENT_AMBULATORY_CARE_PROVIDER_SITE_OTHER): Payer: Medicare HMO | Admitting: Obstetrics & Gynecology

## 2016-07-24 ENCOUNTER — Ambulatory Visit (INDEPENDENT_AMBULATORY_CARE_PROVIDER_SITE_OTHER): Payer: Medicare HMO

## 2016-07-24 ENCOUNTER — Other Ambulatory Visit: Payer: Self-pay | Admitting: Nurse Practitioner

## 2016-07-24 ENCOUNTER — Encounter: Payer: Self-pay | Admitting: Obstetrics & Gynecology

## 2016-07-24 VITALS — BP 132/80 | HR 99 | Ht 62.0 in | Wt 147.0 lb

## 2016-07-24 DIAGNOSIS — R10811 Right upper quadrant abdominal tenderness: Secondary | ICD-10-CM | POA: Diagnosis not present

## 2016-07-24 DIAGNOSIS — I862 Pelvic varices: Secondary | ICD-10-CM | POA: Diagnosis not present

## 2016-07-24 DIAGNOSIS — R1031 Right lower quadrant pain: Secondary | ICD-10-CM

## 2016-07-24 DIAGNOSIS — Z1231 Encounter for screening mammogram for malignant neoplasm of breast: Secondary | ICD-10-CM

## 2016-07-24 NOTE — Progress Notes (Signed)
  HPI: Abdominal Pain Patient presents for evaluation of abdominal pain. The pain is described as aching, and is 2/10 in intensity. Pain is located in the RUQ area without radiation. Onset was gradual occurring a few months ago. Symptoms have been gradually improving since. Aggravating factors: none. Alleviating factors: none. Associated symptoms: none. The patient denies anorexia, arthralagias, chills, constipation, diarrhea, fever, melena, nausea, sweats and vomiting. Risk factors for pelvic/abdominal pain include none.   Ultrasound demonstrates no masses seen These findings are Pelvis - no cysts or ovarian cocnerns; also no evidence for pelvic congestion based on this [place].  (prior CT had suggesiton of this).  PMHx: She  has a past medical history of Anxiety; Colon cancer ([place]) (10/12/2014); Diabetes mellitus without complication ([place]); Hyperlipidemia; Hypertension; Lateral epicondylitis; Polio; and Sleep apnea. Also,  has a past surgical history that includes Appendectomy; Cholecystectomy; Tubal ligation; Colon surgery; Cervical disc surgery; Hernia repair; PORT A CATH INJECTION ([facility]X); Colonoscopy; Esophagogastroduodenoscopy; Back surgery; and Colonoscopy with propofol (N/A, 05/17/2015)., family history includes Alzheimer's disease in her mother; Breast cancer in her cousin and cousin; Colon cancer in her sister; Heart disease in her father.,  reports that she quit smoking about 35 years ago. Her smoking use included Cigarettes. She smoked 1.00 pack per day. She has never used smokeless tobacco. She reports that she does not drink alcohol or use drugs.  She has a current medication list which includes the following prescription(s): alprazolam, amlodipine, citalopram, fenofibrate, ibuprofen, insulin aspart prot & aspart, lidocaine-prilocaine, liraglutide, lovastatin, multi-vitamins, and pantoprazole. Also, has No Known Allergies.  Review of Systems  Constitutional: Negative for chills, fever and  malaise/fatigue.  HENT: Negative for congestion, sinus pain and sore throat.   Eyes: Negative for blurred vision and pain.  Respiratory: Negative for cough and wheezing.   Cardiovascular: Negative for chest pain and leg swelling.  Gastrointestinal: Negative for abdominal pain, constipation, diarrhea, heartburn, nausea and vomiting.  Genitourinary: Negative for dysuria, frequency, hematuria and urgency.  Musculoskeletal: Negative for back pain, joint pain, myalgias and neck pain.  Skin: Negative for itching and rash.  Neurological: Negative for dizziness, tremors and weakness.  Endo/Heme/Allergies: Does not bruise/bleed easily.  Psychiatric/Behavioral: Negative for depression. The patient is not nervous/anxious and does not have insomnia.     Objective: BP 132/80   Pulse 99   Ht 5\' 2"  (1.575 m)   Wt 147 lb (66.7 kg)   BMI 26.89 kg/m   Physical examination Constitutional NAD, Conversant  Skin No rashes, lesions or ulceration.   Extremities: Moves all appropriately.  Normal ROM for age. No lymphadenopathy.  Neuro: Grossly intact  Psych: Oriented to PPT.  Normal mood. Normal affect.   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  Barnett Applebaum, MD, [facility], [facility] 07/24/2016  3:52 PM

## 2016-07-28 ENCOUNTER — Encounter: Payer: Medicare HMO | Attending: Nurse Practitioner | Admitting: Dietician

## 2016-07-28 ENCOUNTER — Encounter: Payer: Self-pay | Admitting: Dietician

## 2016-07-28 ENCOUNTER — Other Ambulatory Visit: Payer: Self-pay | Admitting: Obstetrics & Gynecology

## 2016-07-28 VITALS — BP 120/76 | Ht 62.0 in | Wt 147.7 lb

## 2016-07-28 DIAGNOSIS — E119 Type 2 diabetes mellitus without complications: Secondary | ICD-10-CM | POA: Diagnosis not present

## 2016-07-28 DIAGNOSIS — Z713 Dietary counseling and surveillance: Secondary | ICD-10-CM | POA: Insufficient documentation

## 2016-07-28 NOTE — Patient Instructions (Signed)
Use menus to help plan balanced meals, keeping to small portions of starches, generous portions of the "free" vegetables, and moderate portions of protein foods.

## 2016-07-28 NOTE — Progress Notes (Signed)
Diabetes Self-Management Education  Visit Type:  Follow-up  Appt. Start Time: 1330 Appt. End Time: 9622  07/28/2016  [name], identified by name and date of birth, is a 71 y.o. female with a diagnosis of Diabetes:  .   ASSESSMENT  Blood pressure 120/76, height 5\' 2"  (1.575 m), weight 147 lb 11.2 oz (67 kg). Body mass index is 27.01 kg/m.       Diabetes Self-Management Education - 29/79/89 2119      Complications   How often do you check your blood sugar? 1-2 times/day   Fasting Blood glucose range (mg/dL) 130-179  141-163   Postprandial Blood glucose range (mg/dL) 70-129;130-179  107-172 + one reading of 246   Number of hypoglycemic episodes per month 0   Have you had a dilated eye exam in the past 12 months? Yes   Have you had a dental exam in the past 12 months? Yes   Are you checking your feet? Yes   How many days per week are you checking your feet? 7     Dietary Intake   Breakfast 3 meals and 1-2 snacks   Beverage(s) water, occasionally diet soda-- 1/2 bottle, avg 2c. black coffee (used to drink 5)     Exercise   Exercise Type ADL's  occasional walking, has exercise bike     Patient Education   Nutrition management  Role of diet in the treatment of diabetes and the relationship between the three main macronutrients and blood glucose level;Food label reading, portion sizes and measuring food.;Reviewed blood glucose goals for pre and post meals and how to evaluate the patients' food intake on their blood glucose level.;Meal options for control of blood glucose level and chronic complications.;Other (comment)  basic meal planning using plate method and food models   Physical activity and exercise  Role of exercise on diabetes management, blood pressure control and cardiac health.   Medications Reviewed patients medication for diabetes, action, purpose, timing of dose and side effects.     Post-Education Assessment   Patient understands the diabetes disease and  treatment process. Demonstrates understanding / competency   Patient understands incorporating nutritional management into lifestyle. Demonstrates understanding / competency   Patient undertands incorporating physical activity into lifestyle. Demonstrates understanding / competency   Patient understands using medications safely. Needs Review   Patient understands monitoring blood glucose, interpreting and using results Demonstrates understanding / competency   Patient understands prevention, detection, and treatment of acute complications. Demonstrates understanding / competency   Patient understands prevention, detection, and treatment of chronic complications. Demonstrates understanding / competency   Patient understands how to develop strategies to address psychosocial issues. Needs Review   Patient understands how to develop strategies to promote health/change behavior. Needs Review     Outcomes   Program Status Completed      Learning Objective:  Patient will have a greater understanding of diabetes self-management. Patient education plan is to attend individual and/or group sessions per assessed needs and concerns.  Patient voices confusion over how to plan balanced meals. Used food models and sample menus to aid basic instruction.   She also mentions forgetting to take insulin at times, until 1-2 hours after meals and doesn't know if she should skip the dose or take it late. Reviewed action and timing of insulin and food, and advised her to discuss with MD-- encouraged her to ask about taking insulin late but combining with a snack to avoid hypoglycemia.   Plan:  No additional  appointments, patient to call with any questions or concerns.   Expected Outcomes:  Demonstrated interest in learning. Expect positive outcomes  Education material provided: Planning A Balanced Meal         Quick and Healthy Meals           Smart Snacking  If problems or questions, patient to contact team  via:  Phone

## 2016-08-10 ENCOUNTER — Ambulatory Visit
Admission: RE | Admit: 2016-08-10 | Discharge: 2016-08-10 | Disposition: A | Discharge status: Home / Self Care | Payer: Medicare HMO | Source: Ambulatory Visit | Attending: Nurse Practitioner | Admitting: Nurse Practitioner

## 2016-08-10 DIAGNOSIS — Z1231 Encounter for screening mammogram for malignant neoplasm of breast: Secondary | ICD-10-CM | POA: Insufficient documentation

## 2016-08-10 HISTORY — DX: Personal history of antineoplastic chemotherapy: Z92.21

## 2016-08-30 NOTE — Progress Notes (Signed)
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  INTERVAL HISTORY: Patient returns to clinic today for repeat laboratory work and routine six-month evaluation. She has chronic back pain that she feels might be slightly worse. She also has occasional dizziness. She otherwise feels well. She has no other neurologic complaints. She denies any recent fevers. She has a good appetite and denies weight loss. She denies any chest pain or shortness of breath. She denies any nausea, vomiting, constipation, or diarrhea. She has no abdominal pain and is noted no changes in her bowel movements. She has no melena or hematochezia. Patient offers no further specific complaints today.  REVIEW OF SYSTEMS:   Review of Systems  Constitutional: Negative.  Negative for fever, malaise/fatigue and weight loss.  Respiratory: Negative.   Cardiovascular: Negative.  Negative for chest pain and leg swelling.  Gastrointestinal: Negative for abdominal pain, blood in stool, constipation, diarrhea, melena, nausea and vomiting.  Genitourinary: Negative.  Negative for dysuria.  Musculoskeletal: Positive for back pain.  Neurological: Positive for dizziness. Negative for weakness.  Psychiatric/Behavioral: The patient is nervous/anxious.     As per HPI. Otherwise, a complete review of systems is negative.  PAST MEDICAL HISTORY: Past Medical History:  Diagnosis Date  . Anxiety   . Colon cancer ([place]) 10/12/2014   had chemo  . Diabetes mellitus without complication ([place])   . Hyperlipidemia   . Hypertension   . Lateral epicondylitis   . Personal history of chemotherapy   . Polio   . Sleep apnea     PAST SURGICAL HISTORY: Past Surgical History:  Procedure Laterality Date    . APPENDECTOMY    . BACK SURGERY    . CERVICAL DISC SURGERY    . CHOLECYSTECTOMY    . COLON SURGERY    . COLONOSCOPY    . COLONOSCOPY WITH PROPOFOL N/A 05/17/2015   Procedure: COLONOSCOPY WITH PROPOFOL;  Surgeon: Manya Silvas, MD;  Location: [facility] ENDOSCOPY;  Service: Endoscopy;  Laterality: N/A;  . ESOPHAGOGASTRODUODENOSCOPY    . HERNIA REPAIR    . PORT A CATH INJECTION ([place] HX)    . TUBAL LIGATION      FAMILY HISTORY Family History  Problem Relation Age of Onset  . Alzheimer's disease Mother   . Heart disease Father   . Colon cancer Sister   . Colon cancer Cousin   . Colon cancer Cousin        ADVANCED DIRECTIVES:    HEALTH MAINTENANCE: Social History  Substance Use Topics  . Smoking status: Former Smoker    Packs/day: 1.00    Types: Cigarettes    Quit date: 04/17/1981  . Smokeless tobacco: Never Used  . Alcohol use No    No Known Allergies  Current Outpatient Prescriptions  Medication Sig Dispense Refill  . ALPRAZolam (XANAX) 0.25 MG tablet Take 0.25 mg by mouth 2 (two) times daily as needed for anxiety.    Marland Kitchen amLODipine (NORVASC) 5 MG tablet Take 5 mg by mouth daily.    . citalopram (CELEXA) 20 MG tablet Take 20 mg by mouth daily.     . fenofibrate 54 MG tablet Take 54 mg by mouth daily.     Marland Kitchen ibuprofen (ADVIL,MOTRIN) 800 MG tablet Take 800 mg by mouth every 8 (eight) hours as needed.    Marland Kitchen  Insulin Aspart Prot & Aspart (NOVOLOG MIX 70/30 FLEXPEN [place]) Inject 6 Units into the skin daily before supper.    . lidocaine-prilocaine (EMLA) cream Apply 1 application topically as needed. 30 g 0  . Liraglutide (VICTOZA [place]) Inject 1.8 mg into the skin daily.     Marland Kitchen lovastatin (MEVACOR) 20 MG tablet Take 20 mg by mouth at bedtime.     . Multiple Vitamin (MULTI-VITAMINS) TABS Take 1 tablet by mouth daily.     . pantoprazole (PROTONIX) 20 MG tablet Take 40 mg by mouth daily.      No current facility-administered medications for this visit.    Facility-Administered  Medications Ordered in Other Visits  Medication Dose Route Frequency Provider Last Rate Last Dose  . sodium chloride flush (NS) 0.9 % injection 10 mL  10 mL Intravenous PRN Lloyd Huger, MD   10 mL at 09/01/16 1000    OBJECTIVE: Vitals:   09/01/16 1024  BP: (!) 150/90  Pulse: 99  Temp: 97 F (36.1 C)     Body mass index is 26.65 kg/m.    ECOG FS:0 - Asymptomatic  General: Well-developed, well-nourished, no acute distress. Eyes: Pink conjunctiva, anicteric sclera. Lungs: Clear to auscultation bilaterally. Heart: Regular rate and rhythm. No rubs, murmurs, or gallops. Abdomen: Soft, nontender, nondistended. No organomegaly noted, normoactive bowel sounds. Musculoskeletal: No edema, cyanosis, or clubbing. Neuro: Alert, answering all questions appropriately. Cranial nerves grossly intact. Skin: No rashes or petechiae noted. Psych: Normal affect.   LAB RESULTS:  Lab Results  Component Value Date   NA 142 06/22/2016   K 4.3 06/22/2016   CL 107 06/22/2016   CO2 28 06/22/2016   GLUCOSE 134 (H) 06/22/2016   BUN 24 (H) 06/22/2016   CREATININE 0.91 06/22/2016   CALCIUM 8.9 06/22/2016   PROT 7.7 06/22/2016   ALBUMIN 4.4 06/22/2016   AST 21 06/22/2016   ALT 31 06/22/2016   ALKPHOS 51 06/22/2016   BILITOT 0.6 06/22/2016   GFRNONAA >60 06/22/2016   GFRAA >60 06/22/2016    Lab Results  Component Value Date   WBC 8.8 07/07/2016   NEUTROABS 5.1 07/07/2016   HGB 14.1 07/07/2016   HCT 42.0 07/07/2016   MCV 85.7 07/07/2016   PLT 192 07/07/2016   Lab Results  Component Value Date   CEA 1.8 07/07/2016     STUDIES: Mm Digital Screening Bilateral  Result Date: 08/10/2016 CLINICAL DATA:  Screening. EXAM: DIGITAL SCREENING BILATERAL MAMMOGRAM WITH CAD COMPARISON:  Previous exam(s). ACR Breast Density Category b: There are scattered areas of fibroglandular density. FINDINGS: There are no findings suspicious for malignancy. Images were processed with CAD. IMPRESSION: No  mammographic evidence of malignancy. A result letter of this screening mammogram will be mailed directly to the patient. RECOMMENDATION: Screening mammogram in one year. (Code:SM-B-01Y) BI-RADS CATEGORY  1: Negative. Electronically Signed   By: Ammie Ferrier M.D.   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 She had a CT scan completed March 2016 which revealed no evidence of disease. Her CEA most recent CEA was 1.8, today's result is pending. She had a colonoscopy on May 17, 2015 which was negative for malignancy. No further imaging is necessary unless there is  suspicion of recurrence. Return to clinic in 6 months with repeat laboratory work and further evaluation. 2. Back pain: Chronic. Patient was instructed to discuss with her primary care provider. 3. Dizziness: Also chronic. Monitor.  Patient expressed understanding and was in agreement with this plan. She also understands that She can call clinic at any time with any questions, concerns, or complaints.   Colon cancer [facility])   Staging form: Colon and Rectum, AJCC 7th Edition     Clinical stage from 10/12/2011: Stage IIB (T4a, N0, M0) - Signed by Evlyn Kanner, NP on 10/12/2014   Lloyd Huger, MD   09/01/2016 10:49 AM

## 2016-09-01 ENCOUNTER — Inpatient Hospital Stay (HOSPITAL_BASED_OUTPATIENT_CLINIC_OR_DEPARTMENT_OTHER): Payer: Medicare HMO | Admitting: Oncology

## 2016-09-01 ENCOUNTER — Encounter: Payer: Self-pay | Admitting: Oncology

## 2016-09-01 ENCOUNTER — Inpatient Hospital Stay: Payer: Medicare HMO

## 2016-09-01 ENCOUNTER — Inpatient Hospital Stay: Payer: Medicare HMO | Attending: Oncology

## 2016-09-01 VITALS — BP 150/90 | HR 99 | Temp 97.0°F | Ht 62.0 in | Wt 145.7 lb

## 2016-09-01 DIAGNOSIS — G8929 Other chronic pain: Secondary | ICD-10-CM

## 2016-09-01 DIAGNOSIS — M549 Dorsalgia, unspecified: Secondary | ICD-10-CM

## 2016-09-01 DIAGNOSIS — E785 Hyperlipidemia, unspecified: Secondary | ICD-10-CM | POA: Insufficient documentation

## 2016-09-01 DIAGNOSIS — Z85038 Personal history of other malignant neoplasm of large intestine: Secondary | ICD-10-CM | POA: Diagnosis not present

## 2016-09-01 DIAGNOSIS — Z800 Family history of malignant neoplasm of digestive organs: Secondary | ICD-10-CM | POA: Insufficient documentation

## 2016-09-01 DIAGNOSIS — G4730 Sleep apnea, unspecified: Secondary | ICD-10-CM | POA: Diagnosis not present

## 2016-09-01 DIAGNOSIS — Z9221 Personal history of antineoplastic chemotherapy: Secondary | ICD-10-CM | POA: Diagnosis not present

## 2016-09-01 DIAGNOSIS — Z794 Long term (current) use of insulin: Secondary | ICD-10-CM | POA: Insufficient documentation

## 2016-09-01 DIAGNOSIS — R42 Dizziness and giddiness: Secondary | ICD-10-CM | POA: Diagnosis not present

## 2016-09-01 DIAGNOSIS — Z8612 Personal history of poliomyelitis: Secondary | ICD-10-CM

## 2016-09-01 DIAGNOSIS — C182 Malignant neoplasm of ascending colon: Secondary | ICD-10-CM

## 2016-09-01 DIAGNOSIS — Z87891 Personal history of nicotine dependence: Secondary | ICD-10-CM | POA: Insufficient documentation

## 2016-09-01 DIAGNOSIS — E119 Type 2 diabetes mellitus without complications: Secondary | ICD-10-CM | POA: Insufficient documentation

## 2016-09-01 DIAGNOSIS — F419 Anxiety disorder, unspecified: Secondary | ICD-10-CM | POA: Insufficient documentation

## 2016-09-01 DIAGNOSIS — I10 Essential (primary) hypertension: Secondary | ICD-10-CM | POA: Insufficient documentation

## 2016-09-01 LAB — COMPREHENSIVE METABOLIC PANEL
ALT: 30 U/L (ref 14–54)
AST: 24 U/L (ref 15–41)
Albumin: 4.3 g/dL (ref 3.5–5.0)
Alkaline Phosphatase: 48 U/L (ref 38–126)
Anion gap: 9 (ref 5–15)
BUN: 24 mg/dL — ABNORMAL HIGH (ref 6–20)
CO2: 24 mmol/L (ref 22–32)
Calcium: 8.9 mg/dL (ref 8.9–10.3)
Chloride: 103 mmol/L (ref 101–111)
Creatinine, Ser: 0.82 mg/dL (ref 0.44–1.00)
GFR calc Af Amer: >60 mL/min (ref >60)
GFR calc non Af Amer: >60 mL/min (ref >60)
Glucose, Bld: 194 mg/dL — ABNORMAL HIGH (ref 65–99)
Potassium: 3.5 mmol/L (ref 3.5–5.1)
Sodium: 136 mmol/L (ref 135–145)
Total Bilirubin: 0.5 mg/dL (ref 0.3–1.2)
Total Protein: 7.2 g/dL (ref 6.5–8.1)

## 2016-09-01 LAB — CBC WITH DIFFERENTIAL/PLATELET
Basophils Absolute: 0 10*3/uL (ref 0–0.1)
Basophils Relative: 1 %
Eosinophils Absolute: 0.2 10*3/uL (ref 0–0.7)
Eosinophils Relative: 3 %
HCT: 43.8 % (ref 35.0–47.0)
Hemoglobin: 14.8 g/dL (ref 12.0–16.0)
Lymphocytes Relative: 22 %
Lymphs Abs: 1.9 10*3/uL (ref 1.0–3.6)
MCH: 29.3 pg (ref 26.0–34.0)
MCHC: 33.9 g/dL (ref 32.0–36.0)
MCV: 86.6 fL (ref 80.0–100.0)
Monocytes Absolute: 0.7 10*3/uL (ref 0.2–0.9)
Monocytes Relative: 9 %
Neutro Abs: 5.6 10*3/uL (ref 1.4–6.5)
Neutrophils Relative %: 65 %
Platelets: 186 10*3/uL (ref 150–440)
RBC: 5.06 MIL/uL (ref 3.80–5.20)
RDW: 13.2 % (ref 11.5–14.5)
WBC: 8.5 10*3/uL (ref 3.6–11.0)

## 2016-09-01 MED ORDER — SODIUM CHLORIDE 0.9% FLUSH
10.0000 mL | INTRAVENOUS | Status: DC | PRN
  Administered 2016-09-01: 10 mL via INTRAVENOUS
  Filled 2016-09-01: qty 10

## 2016-09-01 MED ORDER — HEPARIN SOD (PORK) LOCK FLUSH 100 UNIT/ML IV SOLN
500.0000 [IU] | Freq: Once | INTRAVENOUS | Status: AC
  Administered 2016-09-01: 500 [IU] via INTRAVENOUS

## 2016-09-01 NOTE — Progress Notes (Signed)
Patient here for follow up. She was recently hospitalized for pnuemonia.

## 2016-09-02 LAB — CEA: CEA: 2.3 ng/mL (ref 0.0–4.7)

## 2016-10-04 DIAGNOSIS — L918 Other hypertrophic disorders of the skin: Secondary | ICD-10-CM | POA: Diagnosis not present

## 2016-10-04 DIAGNOSIS — L720 Epidermal cyst: Secondary | ICD-10-CM | POA: Diagnosis not present

## 2016-10-04 DIAGNOSIS — L680 Hirsutism: Secondary | ICD-10-CM | POA: Diagnosis not present

## 2016-10-04 DIAGNOSIS — L570 Actinic keratosis: Secondary | ICD-10-CM | POA: Diagnosis not present

## 2016-10-09 DIAGNOSIS — L700 Acne vulgaris: Secondary | ICD-10-CM | POA: Diagnosis not present

## 2016-10-13 ENCOUNTER — Inpatient Hospital Stay: Payer: Medicare HMO | Attending: Oncology

## 2016-10-13 DIAGNOSIS — C182 Malignant neoplasm of ascending colon: Secondary | ICD-10-CM | POA: Diagnosis not present

## 2016-10-13 DIAGNOSIS — Z95828 Presence of other vascular implants and grafts: Secondary | ICD-10-CM

## 2016-10-13 DIAGNOSIS — Z452 Encounter for adjustment and management of vascular access device: Secondary | ICD-10-CM | POA: Diagnosis not present

## 2016-10-13 MED ORDER — HEPARIN SOD (PORK) LOCK FLUSH 100 UNIT/ML IV SOLN
INTRAVENOUS | Status: AC
  Filled 2016-10-13: qty 5

## 2016-10-13 MED ORDER — SODIUM CHLORIDE 0.9% FLUSH
10.0000 mL | INTRAVENOUS | Status: DC | PRN
  Administered 2016-10-13: 10 mL via INTRAVENOUS
  Filled 2016-10-13: qty 10

## 2016-10-13 MED ORDER — HEPARIN SOD (PORK) LOCK FLUSH 100 UNIT/ML IV SOLN
500.0000 [IU] | Freq: Once | INTRAVENOUS | Status: AC
  Administered 2016-10-13: 500 [IU] via INTRAVENOUS

## 2016-10-30 DIAGNOSIS — E119 Type 2 diabetes mellitus without complications: Secondary | ICD-10-CM | POA: Diagnosis not present

## 2016-10-30 DIAGNOSIS — I10 Essential (primary) hypertension: Secondary | ICD-10-CM | POA: Diagnosis not present

## 2016-10-30 DIAGNOSIS — F411 Generalized anxiety disorder: Secondary | ICD-10-CM | POA: Diagnosis not present

## 2016-10-30 DIAGNOSIS — E782 Mixed hyperlipidemia: Secondary | ICD-10-CM | POA: Diagnosis not present

## 2016-10-30 DIAGNOSIS — F331 Major depressive disorder, recurrent, moderate: Secondary | ICD-10-CM | POA: Diagnosis not present

## 2016-11-24 ENCOUNTER — Inpatient Hospital Stay: Payer: Medicare HMO | Attending: Oncology

## 2017-01-05 ENCOUNTER — Inpatient Hospital Stay: Payer: Medicare HMO | Attending: Oncology

## 2017-01-05 ENCOUNTER — Inpatient Hospital Stay: Payer: Medicare HMO

## 2017-01-05 VITALS — BP 154/89 | HR 108 | Resp 20

## 2017-01-05 DIAGNOSIS — Z85038 Personal history of other malignant neoplasm of large intestine: Secondary | ICD-10-CM | POA: Diagnosis not present

## 2017-01-05 DIAGNOSIS — Z95828 Presence of other vascular implants and grafts: Secondary | ICD-10-CM

## 2017-01-05 DIAGNOSIS — Z452 Encounter for adjustment and management of vascular access device: Secondary | ICD-10-CM | POA: Insufficient documentation

## 2017-01-05 MED ORDER — HEPARIN SOD (PORK) LOCK FLUSH 100 UNIT/ML IV SOLN
500.0000 [IU] | Freq: Once | INTRAVENOUS | Status: AC
  Administered 2017-01-05: 500 [IU] via INTRAVENOUS
  Filled 2017-01-05: qty 5

## 2017-01-05 MED ORDER — SODIUM CHLORIDE 0.9% FLUSH
10.0000 mL | INTRAVENOUS | Status: DC | PRN
  Administered 2017-01-05: 10 mL via INTRAVENOUS
  Filled 2017-01-05: qty 10

## 2017-01-29 DIAGNOSIS — E782 Mixed hyperlipidemia: Secondary | ICD-10-CM | POA: Diagnosis not present

## 2017-01-29 DIAGNOSIS — Z0001 Encounter for general adult medical examination with abnormal findings: Secondary | ICD-10-CM | POA: Diagnosis not present

## 2017-01-29 DIAGNOSIS — H9203 Otalgia, bilateral: Secondary | ICD-10-CM | POA: Diagnosis not present

## 2017-01-29 DIAGNOSIS — E1165 Type 2 diabetes mellitus with hyperglycemia: Secondary | ICD-10-CM | POA: Diagnosis not present

## 2017-02-16 ENCOUNTER — Other Ambulatory Visit
Admission: RE | Admit: 2017-02-16 | Discharge: 2017-02-16 | Disposition: A | Discharge status: Home / Self Care | Payer: Medicare HMO | Source: Ambulatory Visit | Attending: Family Medicine | Admitting: Family Medicine

## 2017-02-16 ENCOUNTER — Inpatient Hospital Stay: Payer: Medicare HMO | Attending: Oncology

## 2017-02-16 ENCOUNTER — Inpatient Hospital Stay: Payer: Medicare HMO | Admitting: Oncology

## 2017-02-16 ENCOUNTER — Inpatient Hospital Stay: Payer: Medicare HMO

## 2017-02-16 DIAGNOSIS — Z0001 Encounter for general adult medical examination with abnormal findings: Secondary | ICD-10-CM | POA: Diagnosis not present

## 2017-02-16 DIAGNOSIS — E782 Mixed hyperlipidemia: Secondary | ICD-10-CM | POA: Insufficient documentation

## 2017-02-16 DIAGNOSIS — Z79899 Other long term (current) drug therapy: Secondary | ICD-10-CM | POA: Insufficient documentation

## 2017-02-16 DIAGNOSIS — G4730 Sleep apnea, unspecified: Secondary | ICD-10-CM | POA: Insufficient documentation

## 2017-02-16 DIAGNOSIS — I10 Essential (primary) hypertension: Secondary | ICD-10-CM | POA: Insufficient documentation

## 2017-02-16 DIAGNOSIS — Z8612 Personal history of poliomyelitis: Secondary | ICD-10-CM | POA: Insufficient documentation

## 2017-02-16 DIAGNOSIS — Z87891 Personal history of nicotine dependence: Secondary | ICD-10-CM | POA: Insufficient documentation

## 2017-02-16 DIAGNOSIS — Z9221 Personal history of antineoplastic chemotherapy: Secondary | ICD-10-CM | POA: Insufficient documentation

## 2017-02-16 DIAGNOSIS — Z794 Long term (current) use of insulin: Secondary | ICD-10-CM | POA: Insufficient documentation

## 2017-02-16 DIAGNOSIS — E785 Hyperlipidemia, unspecified: Secondary | ICD-10-CM | POA: Diagnosis not present

## 2017-02-16 DIAGNOSIS — R42 Dizziness and giddiness: Secondary | ICD-10-CM | POA: Insufficient documentation

## 2017-02-16 DIAGNOSIS — M7710 Lateral epicondylitis, unspecified elbow: Secondary | ICD-10-CM | POA: Insufficient documentation

## 2017-02-16 DIAGNOSIS — F419 Anxiety disorder, unspecified: Secondary | ICD-10-CM | POA: Diagnosis not present

## 2017-02-16 DIAGNOSIS — E1165 Type 2 diabetes mellitus with hyperglycemia: Secondary | ICD-10-CM | POA: Diagnosis not present

## 2017-02-16 DIAGNOSIS — M549 Dorsalgia, unspecified: Secondary | ICD-10-CM | POA: Diagnosis not present

## 2017-02-16 DIAGNOSIS — Z800 Family history of malignant neoplasm of digestive organs: Secondary | ICD-10-CM | POA: Diagnosis not present

## 2017-02-16 DIAGNOSIS — C182 Malignant neoplasm of ascending colon: Secondary | ICD-10-CM | POA: Insufficient documentation

## 2017-02-16 LAB — CBC WITH DIFFERENTIAL/PLATELET
Basophils Absolute: 0 10*3/uL (ref 0–0.1)
Basophils Relative: 1 %
EOS ABS: 0.2 10*3/uL (ref 0–0.7)
Eosinophils Relative: 3 %
HCT: 44.1 % (ref 35.0–47.0)
HEMOGLOBIN: 15 g/dL (ref 12.0–16.0)
LYMPHS ABS: 2.3 10*3/uL (ref 1.0–3.6)
LYMPHS PCT: 32 %
MCH: 29.4 pg (ref 26.0–34.0)
MCHC: 34 g/dL (ref 32.0–36.0)
MCV: 86.5 fL (ref 80.0–100.0)
Monocytes Absolute: 0.8 10*3/uL (ref 0.2–0.9)
Monocytes Relative: 11 %
NEUTROS PCT: 53 %
Neutro Abs: 3.8 10*3/uL (ref 1.4–6.5)
Platelets: 199 10*3/uL (ref 150–440)
RBC: 5.1 MIL/uL (ref 3.80–5.20)
RDW: 13.3 % (ref 11.5–14.5)
WBC: 7.2 10*3/uL (ref 3.6–11.0)

## 2017-02-16 LAB — COMPREHENSIVE METABOLIC PANEL
ALK PHOS: 56 U/L (ref 38–126)
ALT: 35 U/L (ref 14–54)
ANION GAP: 9 (ref 5–15)
AST: 29 U/L (ref 15–41)
Albumin: 4.4 g/dL (ref 3.5–5.0)
BILIRUBIN TOTAL: 0.9 mg/dL (ref 0.3–1.2)
BUN: 33 mg/dL — ABNORMAL HIGH (ref 6–20)
CALCIUM: 8.8 mg/dL — AB (ref 8.9–10.3)
CO2: 26 mmol/L (ref 22–32)
Chloride: 103 mmol/L (ref 101–111)
Creatinine, Ser: 1.19 mg/dL — ABNORMAL HIGH (ref 0.44–1.00)
GFR calc non Af Amer: 44 mL/min — ABNORMAL LOW (ref >60)
GFR, EST AFRICAN AMERICAN: 52 mL/min — AB (ref >60)
Glucose, Bld: 151 mg/dL — ABNORMAL HIGH (ref 65–99)
Potassium: 3.6 mmol/L (ref 3.5–5.1)
Sodium: 138 mmol/L (ref 135–145)
TOTAL PROTEIN: 7.5 g/dL (ref 6.5–8.1)

## 2017-02-16 LAB — VITAMIN B12: Vitamin B-12: 373 pg/mL (ref 180–914)

## 2017-02-16 LAB — T4, FREE: Free T4: 0.87 ng/dL (ref 0.61–1.12)

## 2017-02-16 LAB — LIPID PANEL
Cholesterol: 176 mg/dL (ref 0–200)
HDL: 34 mg/dL — ABNORMAL LOW (ref >40)
LDL CALC: 110 mg/dL — AB (ref 0–99)
Total CHOL/HDL Ratio: 5.2 RATIO
Triglycerides: 160 mg/dL — ABNORMAL HIGH (ref <150)
VLDL: 32 mg/dL (ref 0–40)

## 2017-02-16 LAB — FOLATE: FOLATE: 20.8 ng/mL (ref >5.9)

## 2017-02-16 LAB — TSH: TSH: 0.991 u[IU]/mL (ref 0.350–4.500)

## 2017-02-16 MED ORDER — SODIUM CHLORIDE 0.9% FLUSH
10.0000 mL | INTRAVENOUS | Status: DC | PRN
  Administered 2017-02-16: 10 mL via INTRAVENOUS
  Filled 2017-02-16: qty 10

## 2017-02-16 MED ORDER — HEPARIN SOD (PORK) LOCK FLUSH 100 UNIT/ML IV SOLN
500.0000 [IU] | Freq: Once | INTRAVENOUS | Status: AC
  Administered 2017-02-16: 500 [IU] via INTRAVENOUS

## 2017-02-16 MED ORDER — HEPARIN SOD (PORK) LOCK FLUSH 100 UNIT/ML IV SOLN
INTRAVENOUS | Status: AC
  Filled 2017-02-16: qty 5

## 2017-02-17 LAB — CEA: CEA1: 2.2 ng/mL (ref 0.0–4.7)

## 2017-02-17 LAB — VITAMIN D 25 HYDROXY (VIT D DEFICIENCY, FRACTURES): VIT D 25 HYDROXY: 33 ng/mL (ref 30.0–100.0)

## 2017-02-28 NOTE — Progress Notes (Signed)
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  INTERVAL HISTORY: Patient returns to clinic today for repeat laboratory work and routine six-month evaluation. She currently feels well and asymptomatic.  She is having a difficult time with her blood sugars and has occasional dizziness because of this. She otherwise feels well. She has no other neurologic complaints. She denies any recent fevers. She has a good appetite and denies weight loss. She denies any chest pain or shortness of breath. She denies any nausea, vomiting, constipation, or diarrhea. She has no abdominal pain and is noted no changes in her bowel movements. She has no melena or hematochezia. Patient offers no further specific complaints today.  REVIEW OF SYSTEMS:   Review of Systems  Constitutional: Negative.  Negative for fever, malaise/fatigue and weight loss.  Respiratory: Negative.   Cardiovascular: Negative.  Negative for chest pain and leg swelling.  Gastrointestinal: Negative for abdominal pain, blood in stool, constipation, diarrhea, melena, nausea and vomiting.  Genitourinary: Negative.  Negative for dysuria.  Musculoskeletal: Positive for back pain.  Neurological: Positive for dizziness. Negative for weakness.  Psychiatric/Behavioral: The patient is nervous/anxious.     As per HPI. Otherwise, a complete review of systems is negative.  PAST MEDICAL HISTORY: Past Medical History:  Diagnosis Date  . Anxiety   . Colon cancer ([place]) 10/12/2014   had chemo  . Diabetes mellitus without complication ([place])   . Hyperlipidemia   . Hypertension   . Lateral epicondylitis   . Personal history of chemotherapy   . Polio   . Sleep apnea     PAST SURGICAL HISTORY: Past  Surgical History:  Procedure Laterality Date  . APPENDECTOMY    . BACK SURGERY    . CERVICAL DISC SURGERY    . CHOLECYSTECTOMY    . COLON SURGERY    . COLONOSCOPY    . COLONOSCOPY WITH PROPOFOL N/A 05/17/2015   Performed by Manya Silvas, MD at [place]  . ESOPHAGOGASTRODUODENOSCOPY    . HERNIA REPAIR    . PORT A CATH INJECTION ([place] HX)    . TUBAL LIGATION      FAMILY HISTORY Family History  Problem Relation Age of Onset  . Alzheimer's disease Mother   . Heart disease Father   . Colon cancer Sister   . Colon cancer Cousin   . Colon cancer Cousin        ADVANCED DIRECTIVES:    HEALTH MAINTENANCE: Social History   Tobacco Use  . Smoking status: Former Smoker    Packs/day: 1.00    Types: Cigarettes    Last attempt to quit: 04/17/1981    Years since quitting: 35.9  . Smokeless tobacco: Never Used  Substance Use Topics  . Alcohol use: No  . Drug use: No    No Known Allergies  Current Outpatient Medications  Medication Sig Dispense Refill  . ALPRAZolam (XANAX) 0.25 MG tablet Take 0.25 mg by mouth 2 (two) times daily as needed for anxiety.    Marland Kitchen amLODipine (NORVASC) 5 MG tablet Take 5 mg by mouth daily.    . Celecoxib (CELEBREX PO) Take by mouth.    . citalopram (CELEXA) 20 MG tablet Take 20 mg by mouth daily.     . fenofibrate 54 MG tablet Take 54 mg by  mouth daily.     Marland Kitchen ibuprofen (ADVIL,MOTRIN) 800 MG tablet Take 800 mg by mouth every 8 (eight) hours as needed.    . Insulin Aspart Prot & Aspart (NOVOLOG MIX 70/30 FLEXPEN [place]) Inject 15 Units 2 (two) times daily into the skin.     Marland Kitchen lidocaine-prilocaine (EMLA) cream Apply 1 application topically as needed. 30 g 0  . lovastatin (MEVACOR) 20 MG tablet Take 20 mg by mouth at bedtime.     . Multiple Vitamin (MULTI-VITAMINS) TABS Take 1 tablet by mouth daily.     . pantoprazole (PROTONIX) 20 MG tablet Take 40 mg by mouth daily.     . Liraglutide (VICTOZA [place]) Inject 1.8 mg into the skin daily.      No current  facility-administered medications for this visit.     OBJECTIVE: Vitals:   03/02/17 1112  BP: 131/89  Pulse: 70  Resp: 18  Temp: 98.2 F (36.8 C)     Body mass index is 27.02 kg/m.    ECOG FS:0 - Asymptomatic  General: Well-developed, well-nourished, no acute distress. Eyes: Pink conjunctiva, anicteric sclera. Lungs: Clear to auscultation bilaterally. Heart: Regular rate and rhythm. No rubs, murmurs, or gallops. Abdomen: Soft, nontender, nondistended. No organomegaly noted, normoactive bowel sounds. Musculoskeletal: No edema, cyanosis, or clubbing. Neuro: Alert, answering all questions appropriately. Cranial nerves grossly intact. Skin: No rashes or petechiae noted. Psych: Normal affect.   LAB RESULTS:  Lab Results  Component Value Date   NA 138 02/16/2017   K 3.6 02/16/2017   CL 103 02/16/2017   CO2 26 02/16/2017   GLUCOSE 151 (H) 02/16/2017   BUN 33 (H) 02/16/2017   CREATININE 1.19 (H) 02/16/2017   CALCIUM 8.8 (L) 02/16/2017   PROT 7.5 02/16/2017   ALBUMIN 4.4 02/16/2017   AST 29 02/16/2017   ALT 35 02/16/2017   ALKPHOS 56 02/16/2017   BILITOT 0.9 02/16/2017   GFRNONAA 44 (L) 02/16/2017   GFRAA 52 (L) 02/16/2017    Lab Results  Component Value Date   WBC 7.2 02/16/2017   NEUTROABS 3.8 02/16/2017   HGB 15.0 02/16/2017   HCT 44.1 02/16/2017   MCV 86.5 02/16/2017   PLT 199 02/16/2017   Lab Results  Component Value Date   CEA 2.3 09/01/2016     STUDIES: No results found.  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 She had a CT scan completed March 2016 which revealed no evidence of disease. Her CEA most recent CEA is 2.3, today's result is pending. She had a colonoscopy on  May 17, 2015 which was negative for malignancy. No further imaging is necessary unless there is suspicion of recurrence. Return to clinic in 6 months with repeat laboratory work and further evaluation. 2. Back pain: Chronic. 3. Dizziness: Possibly secondary to erratic blood sugars.  Continue treatment and monitoring per primary care. 4.  Hyperglycemia: Continue management per primary care.  Patient expressed understanding and was in agreement with this plan. She also understands that She can call clinic at any time with any questions, concerns, or complaints.   Colon cancer [facility])   Staging form: Colon and Rectum, AJCC 7th Edition     Clinical stage from 10/12/2011: Stage IIB (T4a, N0, M0) - Signed by  Evlyn Kanner, NP on 10/12/2014   Lloyd Huger, MD   03/03/2017 10:45 AM

## 2017-03-02 ENCOUNTER — Inpatient Hospital Stay (HOSPITAL_BASED_OUTPATIENT_CLINIC_OR_DEPARTMENT_OTHER): Payer: Medicare HMO | Admitting: Oncology

## 2017-03-02 VITALS — BP 131/89 | HR 70 | Temp 98.2°F | Resp 18 | Wt 147.7 lb

## 2017-03-02 DIAGNOSIS — I10 Essential (primary) hypertension: Secondary | ICD-10-CM | POA: Diagnosis not present

## 2017-03-02 DIAGNOSIS — E785 Hyperlipidemia, unspecified: Secondary | ICD-10-CM | POA: Diagnosis not present

## 2017-03-02 DIAGNOSIS — Z79899 Other long term (current) drug therapy: Secondary | ICD-10-CM

## 2017-03-02 DIAGNOSIS — C182 Malignant neoplasm of ascending colon: Secondary | ICD-10-CM

## 2017-03-02 DIAGNOSIS — Z87891 Personal history of nicotine dependence: Secondary | ICD-10-CM

## 2017-03-02 DIAGNOSIS — E1165 Type 2 diabetes mellitus with hyperglycemia: Secondary | ICD-10-CM

## 2017-03-02 DIAGNOSIS — Z794 Long term (current) use of insulin: Secondary | ICD-10-CM

## 2017-03-02 DIAGNOSIS — Z8612 Personal history of poliomyelitis: Secondary | ICD-10-CM

## 2017-03-02 DIAGNOSIS — F419 Anxiety disorder, unspecified: Secondary | ICD-10-CM

## 2017-03-02 DIAGNOSIS — M7710 Lateral epicondylitis, unspecified elbow: Secondary | ICD-10-CM

## 2017-03-02 DIAGNOSIS — M549 Dorsalgia, unspecified: Secondary | ICD-10-CM

## 2017-03-02 DIAGNOSIS — Z9221 Personal history of antineoplastic chemotherapy: Secondary | ICD-10-CM

## 2017-03-02 DIAGNOSIS — R42 Dizziness and giddiness: Secondary | ICD-10-CM | POA: Diagnosis not present

## 2017-03-02 DIAGNOSIS — G4730 Sleep apnea, unspecified: Secondary | ICD-10-CM

## 2017-03-02 DIAGNOSIS — Z800 Family history of malignant neoplasm of digestive organs: Secondary | ICD-10-CM

## 2017-03-20 ENCOUNTER — Ambulatory Visit: Payer: Self-pay | Admitting: Surgery

## 2017-03-30 ENCOUNTER — Inpatient Hospital Stay: Payer: Medicare HMO | Attending: Oncology

## 2017-03-30 VITALS — Resp 18

## 2017-03-30 DIAGNOSIS — Z95828 Presence of other vascular implants and grafts: Secondary | ICD-10-CM

## 2017-03-30 DIAGNOSIS — Z452 Encounter for adjustment and management of vascular access device: Secondary | ICD-10-CM | POA: Diagnosis not present

## 2017-03-30 DIAGNOSIS — C182 Malignant neoplasm of ascending colon: Secondary | ICD-10-CM | POA: Diagnosis not present

## 2017-03-30 MED ORDER — HEPARIN SOD (PORK) LOCK FLUSH 100 UNIT/ML IV SOLN
INTRAVENOUS | Status: AC
  Filled 2017-03-30: qty 5

## 2017-03-30 MED ORDER — SODIUM CHLORIDE 0.9% FLUSH
10.0000 mL | INTRAVENOUS | Status: DC | PRN
  Administered 2017-03-30: 10 mL via INTRAVENOUS
  Filled 2017-03-30: qty 10

## 2017-03-30 MED ORDER — HEPARIN SOD (PORK) LOCK FLUSH 100 UNIT/ML IV SOLN
500.0000 [IU] | Freq: Once | INTRAVENOUS | Status: AC
  Administered 2017-03-30: 500 [IU] via INTRAVENOUS

## 2017-04-18 ENCOUNTER — Other Ambulatory Visit: Payer: Self-pay | Admitting: Nurse Practitioner

## 2017-04-18 ENCOUNTER — Telehealth: Payer: Self-pay

## 2017-04-18 DIAGNOSIS — J069 Acute upper respiratory infection, unspecified: Secondary | ICD-10-CM

## 2017-04-18 MED ORDER — AMOXICILLIN-POT CLAVULANATE 875-125 MG PO TABS: 1.0000 | ORAL_TABLET | Freq: Two times a day (BID) | ORAL | 0 refills | Status: DC

## 2017-04-18 NOTE — Telephone Encounter (Signed)
Called pt to let her know that augmentin 875 mg twice daily was sent in to her pharmacy for her congestion and cough.

## 2017-04-18 NOTE — Progress Notes (Signed)
Start augmentin 875mg  bid for 10 days. Needs samples of victoza

## 2017-05-02 DIAGNOSIS — H9203 Otalgia, bilateral: Secondary | ICD-10-CM | POA: Insufficient documentation

## 2017-05-02 DIAGNOSIS — E1165 Type 2 diabetes mellitus with hyperglycemia: Secondary | ICD-10-CM | POA: Insufficient documentation

## 2017-05-02 DIAGNOSIS — E782 Mixed hyperlipidemia: Secondary | ICD-10-CM | POA: Insufficient documentation

## 2017-05-02 DIAGNOSIS — E1169 Type 2 diabetes mellitus with other specified complication: Secondary | ICD-10-CM | POA: Insufficient documentation

## 2017-05-03 ENCOUNTER — Encounter: Payer: Self-pay | Admitting: Nurse Practitioner

## 2017-05-03 ENCOUNTER — Ambulatory Visit (INDEPENDENT_AMBULATORY_CARE_PROVIDER_SITE_OTHER): Payer: Medicare HMO | Admitting: Nurse Practitioner

## 2017-05-03 VITALS — BP 154/91 | HR 79 | Resp 16 | Ht 62.0 in | Wt 150.2 lb

## 2017-05-03 DIAGNOSIS — E1165 Type 2 diabetes mellitus with hyperglycemia: Secondary | ICD-10-CM | POA: Diagnosis not present

## 2017-05-03 DIAGNOSIS — I10 Essential (primary) hypertension: Secondary | ICD-10-CM

## 2017-05-03 DIAGNOSIS — J069 Acute upper respiratory infection, unspecified: Secondary | ICD-10-CM

## 2017-05-03 DIAGNOSIS — C182 Malignant neoplasm of ascending colon: Secondary | ICD-10-CM

## 2017-05-03 DIAGNOSIS — I152 Hypertension secondary to endocrine disorders: Secondary | ICD-10-CM | POA: Insufficient documentation

## 2017-05-03 DIAGNOSIS — E782 Mixed hyperlipidemia: Secondary | ICD-10-CM

## 2017-05-03 LAB — POCT GLYCOSYLATED HEMOGLOBIN (HGB A1C): Hemoglobin A1C: 8

## 2017-05-03 MED ORDER — LEVOFLOXACIN 500 MG PO TABS: 500.0000 mg | ORAL_TABLET | Freq: Every day | ORAL | 0 refills | Status: DC

## 2017-05-03 MED ORDER — SEMAGLUTIDE(0.25 OR 0.5MG/DOS) 2 MG/1.5ML ~~LOC~~ SOPN: 0.5000 mg | PEN_INJECTOR | SUBCUTANEOUS | 3 refills | Status: DC

## 2017-05-03 NOTE — Progress Notes (Signed)
[facility] [place], [place] [address]  Internal MEDICINE  Office Visit Note  Patient Name: Tiffany Hawkins  [id number]  [id number]  Date of Service: 05/03/2017  No chief complaint on file.   URI   This is a new problem. The current episode started in the past 7 days. The problem has been unchanged. There has been no fever. Associated symptoms include congestion, coughing, ear pain, headaches, a plugged ear sensation, rhinorrhea, sinus pain, sneezing, a sore throat, swollen glands and wheezing. She has tried acetaminophen, decongestant, NSAIDs and sleep for the symptoms. The treatment provided mild relief.  Diabetes  She presents for her follow-up diabetic visit. She has type 2 diabetes mellitus. No MedicAlert identification noted. Her disease course has been worsening. Hypoglycemia symptoms include dizziness, headaches, mood changes, nervousness/anxiousness and sleepiness. Associated symptoms include fatigue, polydipsia, polyuria, visual change and weakness. Symptoms are worsening. Diabetic complications include peripheral neuropathy. Risk factors for coronary artery disease include dyslipidemia, hypertension and post-menopausal. Current diabetic treatment includes insulin injections and oral agent (dual therapy). She is compliant with treatment some of the time. Her weight is stable. She is following a diabetic and generally healthy diet. Meal planning includes ADA exchanges and avoidance of concentrated sweets. She has not had a previous visit with a dietitian. She rarely participates in exercise. Her home blood glucose trend is increasing steadily. An ACE inhibitor/angiotensin II receptor blocker is being taken. She does not see a podiatrist.Eye exam is current.    Pt is here for routine follow up.    Current Medication: Outpatient Encounter Medications as of 05/03/2017  Medication Sig Note  . ALPRAZolam (XANAX) 0.25 MG tablet Take 0.25 mg by mouth 2 (two)  times daily as needed for anxiety.   Marland Kitchen amLODipine (NORVASC) 5 MG tablet Take 5 mg by mouth daily.   . Blood Glucose Calibration (ACCU-CHEK SMARTVIEW CONTROL VI) by In Vitro route. Use as di   . Celecoxib (CELEBREX PO) Take by mouth.   . citalopram (CELEXA) 20 MG tablet Take 20 mg by mouth daily.  07/14/2016: Hasn't been taking in several months. Waiting on new prescription from MD.   . fenofibrate 54 MG tablet Take 54 mg by mouth daily.    Marland Kitchen glucose blood (ACCU-CHEK SMARTVIEW) test strip 1 each by Other route as needed for other. Use as instructed 3 times   . ibuprofen (ADVIL,MOTRIN) 800 MG tablet Take 800 mg by mouth every 8 (eight) hours as needed.   . Insulin Aspart Prot & Aspart (NOVOLOG MIX 70/30 FLEXPEN [place]) Inject 15 Units 2 (two) times daily into the skin.    Marland Kitchen lidocaine-prilocaine (EMLA) cream Apply 1 application topically as needed.   . Liraglutide (VICTOZA ) Inject 1.8 mg into the skin daily.    Marland Kitchen lovastatin (MEVACOR) 20 MG tablet Take 20 mg by mouth at bedtime.    . Multiple Vitamin (MULTI-VITAMINS) TABS Take 1 tablet by mouth daily.    . pantoprazole (PROTONIX) 20 MG tablet Take 40 mg by mouth daily.    Marland Kitchen amoxicillin-clavulanate (AUGMENTIN) 875-125 MG tablet Take 1 tablet by mouth 2 (two) times daily. (Patient not taking: Reported on 05/03/2017)    No facility-administered encounter medications on file as of 05/03/2017.     Surgical History: Past Surgical History:  Procedure Laterality Date  . APPENDECTOMY    . BACK SURGERY    . CERVICAL DISC SURGERY    . CHOLECYSTECTOMY    . COLON SURGERY    . COLONOSCOPY    .  COLONOSCOPY WITH PROPOFOL N/A 05/17/2015   Procedure: COLONOSCOPY WITH PROPOFOL;  Surgeon: Manya Silvas, MD;  Location: [facility] ENDOSCOPY;  Service: Endoscopy;  Laterality: N/A;  . ESOPHAGOGASTRODUODENOSCOPY    . HERNIA REPAIR    . PORT A CATH INJECTION ([place] HX)    . TUBAL LIGATION      Medical History: Past Medical History:  Diagnosis Date  . Anxiety   .  Colon cancer ([place]) 10/12/2014   had chemo  . Diabetes mellitus without complication ([place])   . Hyperlipidemia   . Hypertension   . Lateral epicondylitis   . Personal history of chemotherapy   . Polio   . Sleep apnea     Family History: Family History  Problem Relation Age of Onset  . Alzheimer's disease Mother   . Heart disease Father   . Colon cancer Sister   . Colon cancer Cousin   . Colon cancer Cousin     Social History   Socioeconomic History  . Marital status: Married    Spouse name: Not on file  . Number of children: Not on file  . Years of education: Not on file  . Highest education level: Not on file  Social Needs  . Financial resource strain: Not on file  . Food insecurity - worry: Not on file  . Food insecurity - inability: Not on file  . Transportation needs - medical: Not on file  . Transportation needs - non-medical: Not on file  Occupational History  . Not on file  Tobacco Use  . Smoking status: Former Smoker    Packs/day: 1.00    Types: Cigarettes    Last attempt to quit: 04/17/1981    Years since quitting: 36.0  . Smokeless tobacco: Never Used  Substance and Sexual Activity  . Alcohol use: No  . Drug use: No  . Sexual activity: No  Other Topics Concern  . Not on file  Social History Narrative  . Not on file      Review of Systems  Constitutional: Positive for activity change and fatigue. Negative for fever.  HENT: Positive for congestion, ear pain, rhinorrhea, sinus pressure, sinus pain, sneezing, sore throat and voice change.   Eyes: Positive for itching.  Respiratory: Positive for cough and wheezing.   Gastrointestinal: Positive for constipation.  Endocrine: Positive for polydipsia and polyuria.  Musculoskeletal: Positive for arthralgias.  Neurological: Positive for dizziness, weakness and headaches.  Hematological: Negative.   Psychiatric/Behavioral: The patient is nervous/anxious.     Today's Vitals   05/03/17 1110  BP: (!)  154/91  Pulse: 79  Resp: 16  SpO2: 95%  Weight: 150 lb 3.2 oz (68.1 kg)  Height: 5\' 2"  (1.575 m)    Physical Exam  Constitutional: She is oriented to person, place, and time. She appears well-developed and well-nourished. No distress.  HENT:  Head: Normocephalic and atraumatic.  Right Ear: Tympanic membrane is erythematous and bulging.  Left Ear: Tympanic membrane is erythematous and bulging.  Nose: Rhinorrhea present. Right sinus exhibits frontal sinus tenderness. Left sinus exhibits frontal sinus tenderness.  Mouth/Throat: Posterior oropharyngeal edema present. No oropharyngeal exudate.  Eyes: EOM are normal. Pupils are equal, round, and reactive to light.  Neck: Normal range of motion. Neck supple. No JVD present. No tracheal deviation present. No thyromegaly present.  Cardiovascular: Normal rate, regular rhythm and normal heart sounds. Exam reveals no gallop and no friction rub.  No murmur heard. Pulmonary/Chest: Effort normal. No respiratory distress. She has wheezes. She has no  rales. She exhibits no tenderness.  Abdominal: Soft. Bowel sounds are normal. There is no tenderness.  Musculoskeletal: Normal range of motion.  Lymphadenopathy:    She has cervical adenopathy.  Neurological: She is alert and oriented to person, place, and time. No cranial nerve deficit.  Skin: Skin is warm and dry. She is not diaphoretic.  Psychiatric: She has a normal mood and affect. Her behavior is normal. Judgment and thought content normal.   Assessment/Plan:  1. Uncontrolled type 2 diabetes mellitus with hyperglycemia (HCC) - POCT HgB A1C - 8.0 today. Add ozempic 0.25mg  [place] weekly. Sample provided. Increase to 0.5mg  [place] weekly with new pen. Continue oral diabetic medication as prescribed.  - Semaglutide (OZEMPIC) 0.25 or 0.5 MG/DOSE SOPN; Inject 0.5 mg into the skin once a week.  Dispense: 4 pen; Refill: 3  2. Acute upper respiratory infection - levofloxacin (LEVAQUIN) 500 MG tablet; Take 1  tablet (500 mg total) by mouth daily.  Dispense: 7 tablet; Refill: 0 OTC medication should be used to help relieve symptoms. Samples Mucinex provided.   3. Essential hypertension Stable. Continue bp medication as prescribed.   4. Mixed hyperlipidemia Stable. Continue statin as prescribed.   5. Cancer of ascending colon ([place]) Continue regular visits with oncology/GI as scheduled for monitoring.   She should follow up in 3 months and sooner if needed.   General Counseling: ilianna bown understanding of the findings of todays visit and agrees with plan of treatment. I have discussed any further diagnostic evaluation that may be needed or ordered today. We also reviewed her medications today. she has been encouraged to call the office with any questions or concerns that should arise related to todays visit.   This patient was seen by Leretha Pol, FNP- C in Collaboration with Dr Lavera Guise as a part of collaborative care agreement    Orders Placed This Encounter  Procedures  . POCT HgB A1C     Time spent: 25 Minutes   Dr Lavera Guise Internal medicine

## 2017-05-03 NOTE — Patient Instructions (Addendum)
Diabetes Mellitus and Nutrition When you have diabetes (diabetes mellitus), it is very important to have healthy eating habits because your blood sugar (glucose) levels are greatly affected by what you eat and drink. Eating healthy foods in the appropriate amounts, at about the same times every day, can help you:  Control your blood glucose.  Lower your risk of heart disease.  Improve your blood pressure.  Reach or maintain a healthy weight.  Every person with diabetes is different, and each person has different needs for a meal plan. Your health care provider may recommend that you work with a diet and nutrition specialist (dietitian) to make a meal plan that is best for you. Your meal plan may vary depending on factors such as:  The calories you need.  The medicines you take.  Your weight.  Your blood glucose, blood pressure, and cholesterol levels.  Your activity level.  Other health conditions you have, such as heart or kidney disease.  How do carbohydrates affect me? Carbohydrates affect your blood glucose level more than any other type of food. Eating carbohydrates naturally increases the amount of glucose in your blood. Carbohydrate counting is a method for keeping track of how many carbohydrates you eat. Counting carbohydrates is important to keep your blood glucose at a healthy level, especially if you use insulin or take certain oral diabetes medicines. It is important to know how many carbohydrates you can safely have in each meal. This is different for every person. Your dietitian can help you calculate how many carbohydrates you should have at each meal and for snack. Foods that contain carbohydrates include:  Bread, cereal, rice, pasta, and crackers.  Potatoes and corn.  Peas, beans, and lentils.  Milk and yogurt.  Fruit and juice.  Desserts, such as cakes, cookies, ice cream, and candy.  How does alcohol affect me? Alcohol can cause a sudden decrease in blood  glucose (hypoglycemia), especially if you use insulin or take certain oral diabetes medicines. Hypoglycemia can be a life-threatening condition. Symptoms of hypoglycemia (sleepiness, dizziness, and confusion) are similar to symptoms of having too much alcohol. If your health care provider says that alcohol is safe for you, follow these guidelines:  Limit alcohol intake to no more than 1 drink per day for nonpregnant women and 2 drinks per day for men. One drink equals 12 oz of beer, 5 oz of wine, or 1 oz of hard liquor.  Do not drink on an empty stomach.  Keep yourself hydrated with water, diet soda, or unsweetened iced tea.  Keep in mind that regular soda, juice, and other mixers may contain a lot of sugar and must be counted as carbohydrates.  What are tips for following this plan? Reading food labels  Start by checking the serving size on the label. The amount of calories, carbohydrates, fats, and other nutrients listed on the label are based on one serving of the food. Many foods contain more than one serving per package.  Check the total grams (g) of carbohydrates in one serving. You can calculate the number of servings of carbohydrates in one serving by dividing the total carbohydrates by 15. For example, if a food has 30 g of total carbohydrates, it would be equal to 2 servings of carbohydrates.  Check the number of grams (g) of saturated and trans fats in one serving. Choose foods that have low or no amount of these fats.  Check the number of milligrams (mg) of sodium in one serving. Most people   should limit total sodium intake to less than 2,300 mg per day.  Always check the nutrition information of foods labeled as "low-fat" or "nonfat". These foods may be higher in added sugar or refined carbohydrates and should be avoided.  Talk to your dietitian to identify your daily goals for nutrients listed on the label. Shopping  Avoid buying canned, premade, or processed foods. These  foods tend to be high in fat, sodium, and added sugar.  Shop around the outside edge of the grocery store. This includes fresh fruits and vegetables, bulk grains, fresh meats, and fresh dairy. Cooking  Use low-heat cooking methods, such as baking, instead of high-heat cooking methods like deep frying.  Cook using healthy oils, such as olive, canola, or sunflower oil.  Avoid cooking with butter, cream, or high-fat meats. Meal planning  Eat meals and snacks regularly, preferably at the same times every day. Avoid going long periods of time without eating.  Eat foods high in fiber, such as fresh fruits, vegetables, beans, and whole grains. Talk to your dietitian about how many servings of carbohydrates you can eat at each meal.  Eat 4-6 ounces of lean protein each day, such as lean meat, chicken, fish, eggs, or tofu. 1 ounce is equal to 1 ounce of meat, chicken, or fish, 1 egg, or 1/4 cup of tofu.  Eat some foods each day that contain healthy fats, such as avocado, nuts, seeds, and fish. Lifestyle   Check your blood glucose regularly.  Exercise at least 30 minutes 5 or more days each week, or as told by your health care provider.  Take medicines as told by your health care provider.  Do not use any products that contain nicotine or tobacco, such as cigarettes and e-cigarettes. If you need help quitting, ask your health care provider.  Work with a counselor or diabetes educator to identify strategies to manage stress and any emotional and social challenges. What are some questions to ask my health care provider?  Do I need to meet with a diabetes educator?  Do I need to meet with a dietitian?  What number can I call if I have questions?  When are the best times to check my blood glucose? Where to find more information:  American Diabetes Association: diabetes.org/food-and-fitness/food  Academy of Nutrition and Dietetics:  www.eatright.org/resources/health/diseases-and-conditions/diabetes  National Institute of Diabetes and Digestive and Kidney Diseases (NIH): www.niddk.nih.gov/health-information/diabetes/overview/diet-eating-physical-activity Summary  A healthy meal plan will help you control your blood glucose and maintain a healthy lifestyle.  Working with a diet and nutrition specialist (dietitian) can help you make a meal plan that is best for you.  Keep in mind that carbohydrates and alcohol have immediate effects on your blood glucose levels. It is important to count carbohydrates and to use alcohol carefully. This information is not intended to replace advice given to you by your health care provider. Make sure you discuss any questions you have with your health care provider. Document Released: 12/29/2004 Document Revised: 05/08/2016 Document Reviewed: 05/08/2016 Elsevier Interactive Patient Education  2018 Elsevier Inc.  

## 2017-05-16 ENCOUNTER — Other Ambulatory Visit: Payer: Self-pay

## 2017-05-16 MED ORDER — INSULIN ASPART PROT & ASPART (70-30 MIX) 100 UNIT/ML PEN: 15.0000 [IU] | PEN_INJECTOR | Freq: Two times a day (BID) | SUBCUTANEOUS | 11 refills | Status: DC

## 2017-05-18 ENCOUNTER — Other Ambulatory Visit: Payer: Self-pay

## 2017-05-18 MED ORDER — CITALOPRAM HYDROBROMIDE 20 MG PO TABS: ORAL_TABLET | ORAL | 1 refills | Status: DC

## 2017-05-28 ENCOUNTER — Other Ambulatory Visit: Payer: Self-pay | Admitting: Internal Medicine

## 2017-08-02 ENCOUNTER — Ambulatory Visit (INDEPENDENT_AMBULATORY_CARE_PROVIDER_SITE_OTHER): Payer: Medicare HMO | Admitting: Nurse Practitioner

## 2017-08-02 ENCOUNTER — Encounter: Payer: Self-pay | Admitting: Nurse Practitioner

## 2017-08-02 VITALS — BP 138/82 | HR 78 | Resp 16 | Ht 61.5 in | Wt 149.4 lb

## 2017-08-02 DIAGNOSIS — E782 Mixed hyperlipidemia: Secondary | ICD-10-CM | POA: Diagnosis not present

## 2017-08-02 DIAGNOSIS — Z23 Encounter for immunization: Secondary | ICD-10-CM | POA: Diagnosis not present

## 2017-08-02 DIAGNOSIS — I10 Essential (primary) hypertension: Secondary | ICD-10-CM | POA: Diagnosis not present

## 2017-08-02 DIAGNOSIS — E11649 Type 2 diabetes mellitus with hypoglycemia without coma: Secondary | ICD-10-CM

## 2017-08-02 LAB — POCT GLYCOSYLATED HEMOGLOBIN (HGB A1C): Hemoglobin A1C: 6.4

## 2017-08-02 NOTE — Progress Notes (Signed)
[facility] [place], [place] [address]  Internal MEDICINE  Office Visit Note  Patient Name: Tiffany Hawkins  [id number]  [id number]  Date of Service: 08/22/2017   Pt is here for routine follow up.   Chief Complaint  Patient presents with  . Diabetes  . Extremity Weakness    Diabetes  She presents for her follow-up diabetic visit. She has type 2 diabetes mellitus. No MedicAlert identification noted. Her disease course has been stable. Hypoglycemia symptoms include dizziness, headaches, nervousness/anxiousness and tremors. Associated symptoms include blurred vision, fatigue and weakness. Pertinent negatives for diabetes include no chest pain, no polydipsia and no polyuria. There are no hypoglycemic complications. Symptoms are stable. Diabetic complications include a CVA and peripheral neuropathy. Risk factors for coronary artery disease include diabetes mellitus, dyslipidemia, hypertension, post-menopausal and stress. Current diabetic treatment includes insulin injections (ozempic 0.5mg  weekly). She is compliant with treatment all of the time. Her weight is stable. She is following a generally healthy diet. Meal planning includes avoidance of concentrated sweets. She has had a previous visit with a dietitian. She participates in exercise intermittently. There is no change in her home blood glucose trend. An ACE inhibitor/angiotensin II receptor blocker is not being taken. She does not see a podiatrist.Eye exam is current.       Current Medication: Outpatient Encounter Medications as of 08/02/2017  Medication Sig  . ALPRAZolam (XANAX) 0.25 MG tablet Take 0.25 mg by mouth 2 (two) times daily as needed for anxiety.  Marland Kitchen amLODipine (NORVASC) 5 MG tablet TAKE 1 TABLET EVERY DAY  . amoxicillin-clavulanate (AUGMENTIN) 875-125 MG tablet Take 1 tablet by mouth 2 (two) times daily.  . Blood Glucose Calibration (ACCU-CHEK SMARTVIEW CONTROL VI) by In Vitro route. Use as  di  . Celecoxib (CELEBREX PO) Take by mouth.  . citalopram (CELEXA) 20 MG tablet Take 2 tablets daily for depression  . fenofibrate 54 MG tablet Take 54 mg by mouth daily.   Marland Kitchen glucose blood (ACCU-CHEK SMARTVIEW) test strip 1 each by Other route as needed for other. Use as instructed 3 times  . ibuprofen (ADVIL,MOTRIN) 800 MG tablet Take 800 mg by mouth every 8 (eight) hours as needed.  . insulin aspart protamine - aspart (NOVOLOG MIX 70/30 FLEXPEN) (70-30) 100 UNIT/ML FlexPen Inject 0.15 mLs (15 Units total) into the skin 2 (two) times daily.  Marland Kitchen levofloxacin (LEVAQUIN) 500 MG tablet Take 1 tablet (500 mg total) by mouth daily.  Marland Kitchen lidocaine-prilocaine (EMLA) cream Apply 1 application topically as needed.  . lovastatin (MEVACOR) 20 MG tablet Take 20 mg by mouth at bedtime.   . Multiple Vitamin (MULTI-VITAMINS) TABS Take 1 tablet by mouth daily.   . pantoprazole (PROTONIX) 20 MG tablet Take 40 mg by mouth daily.   . Semaglutide (OZEMPIC) 0.25 or 0.5 MG/DOSE SOPN Inject 0.5 mg into the skin once a week.   No facility-administered encounter medications on file as of 08/02/2017.     Surgical History: Past Surgical History:  Procedure Laterality Date  . APPENDECTOMY    . BACK SURGERY    . CERVICAL DISC SURGERY    . CHOLECYSTECTOMY    . COLON SURGERY    . COLONOSCOPY    . COLONOSCOPY WITH PROPOFOL N/A 05/17/2015   Procedure: COLONOSCOPY WITH PROPOFOL;  Surgeon: Manya Silvas, MD;  Location: [facility] ENDOSCOPY;  Service: Endoscopy;  Laterality: N/A;  . ESOPHAGOGASTRODUODENOSCOPY    . HERNIA REPAIR    . PORT A CATH INJECTION ([place] [facility])    .  TUBAL LIGATION      Medical History: Past Medical History:  Diagnosis Date  . Anxiety   . Colon cancer ([place]) 10/12/2014   had chemo  . Diabetes mellitus without complication ([place])   . Hyperlipidemia   . Hypertension   . Lateral epicondylitis   . Personal history of chemotherapy   . Polio   . Sleep apnea     Family History: Family History   Problem Relation Age of Onset  . Alzheimer's disease Mother   . Heart disease Father   . Colon cancer Sister   . Colon cancer Cousin   . Colon cancer Cousin     Social History   Socioeconomic History  . Marital status: Married    Spouse name: Not on file  . Number of children: Not on file  . Years of education: Not on file  . Highest education level: Not on file  Occupational History  . Not on file  Social Needs  . Financial resource strain: Not on file  . Food insecurity:    Worry: Not on file    Inability: Not on file  . Transportation needs:    Medical: Not on file    Non-medical: Not on file  Tobacco Use  . Smoking status: Former Smoker    Packs/day: 1.00    Types: Cigarettes    Last attempt to quit: 04/17/1981    Years since quitting: 36.3  . Smokeless tobacco: Never Used  Substance and Sexual Activity  . Alcohol use: No  . Drug use: No  . Sexual activity: Never  Lifestyle  . Physical activity:    Days per week: Not on file    Minutes per session: Not on file  . Stress: Not on file  Relationships  . Social connections:    Talks on phone: Not on file    Gets together: Not on file    Attends religious service: Not on file    Active member of club or organization: Not on file    Attends meetings of clubs or organizations: Not on file    Relationship status: Not on file  . Intimate partner violence:    Fear of current or ex partner: Not on file    Emotionally abused: Not on file    Physically abused: Not on file    Forced sexual activity: Not on file  Other Topics Concern  . Not on file  Social History Narrative  . Not on file      Review of Systems  Constitutional: Positive for fatigue. Negative for activity change, fever and unexpected weight change.  HENT: Negative for congestion, ear pain, rhinorrhea, sinus pressure, sinus pain, sneezing, sore throat and voice change.   Eyes: Positive for blurred vision. Negative for discharge and itching.   Respiratory: Negative for cough, chest tightness, shortness of breath and wheezing.   Cardiovascular: Negative for chest pain and palpitations.  Gastrointestinal: Positive for constipation. Negative for diarrhea, nausea and vomiting.  Endocrine: Negative for polydipsia and polyuria.       Blood sugars doing well   Genitourinary: Negative for flank pain and frequency.  Musculoskeletal: Positive for arthralgias and myalgias.  Skin: Negative for rash.  Allergic/Immunologic: Negative for environmental allergies.  Neurological: Positive for dizziness, tremors, weakness and headaches.  Hematological: Negative.  Negative for adenopathy.  Psychiatric/Behavioral: Positive for dysphoric mood. The patient is nervous/anxious.     Today's Vitals   08/02/17 1109  BP: 138/82  Pulse: 78  Resp: 16  SpO2: 95%  Weight: 149 lb 6.4 oz (67.8 kg)  Height: 5' 1.5" (1.562 m)    Physical Exam  Constitutional: She is oriented to person, place, and time. She appears well-developed and well-nourished. No distress.  HENT:  Head: Normocephalic and atraumatic.  Right Ear: Tympanic membrane is erythematous and bulging.  Left Ear: Tympanic membrane is erythematous and bulging.  Nose: Rhinorrhea present. Right sinus exhibits frontal sinus tenderness. Left sinus exhibits frontal sinus tenderness.  Mouth/Throat: Posterior oropharyngeal edema present. No oropharyngeal exudate.  Eyes: Pupils are equal, round, and reactive to light. Conjunctivae and EOM are normal.  Neck: Normal range of motion. Neck supple. No JVD present. Carotid bruit is not present. No tracheal deviation present. No thyromegaly present.  Cardiovascular: Normal rate, regular rhythm and normal heart sounds. Exam reveals no gallop and no friction rub.  No murmur heard. Pulmonary/Chest: Effort normal and breath sounds normal. No respiratory distress. She has no wheezes. She has no rales. She exhibits no tenderness.  Abdominal: Soft. Bowel sounds are  normal. There is no tenderness.  Musculoskeletal: Normal range of motion.  Lymphadenopathy:    She has no cervical adenopathy.  Neurological: She is alert and oriented to person, place, and time. She displays normal reflexes. No cranial nerve deficit.  Skin: Skin is warm and dry. Capillary refill takes 2 to 3 seconds. She is not diaphoretic.  Psychiatric: Her speech is normal and behavior is normal. Judgment and thought content normal. Her mood appears anxious. Cognition and memory are normal.  Nursing note and vitals reviewed.   Assessment/Plan: 1. Uncontrolled type 2 diabetes mellitus with hypoglycemia, unspecified hypoglycemia coma status (HCC) - POCT HgB A1C 6.4 today. Continue current diabetic regimen. Follow ADA diet. Refer to eye center for diabetic eye exam.  - Ambulatory referral to Ophthalmology  2. Essential hypertension Stable. Continue bp medication as prescribed.   3. Mixed hyperlipidemia Continue statin as prescribed.   4. Need for vaccination against Streptococcus pneumoniae using pneumococcal conjugate vaccine 13 Order for Prevnar 13 sent to pharmacy today. - Pneumococcal conjugate vaccine 13-valent IM  General Counseling: Jaycey verbalizes understanding of the findings of todays visit and agrees with plan of treatment. I have discussed any further diagnostic evaluation that may be needed or ordered today. We also reviewed her medications today. she has been encouraged to call the office with any questions or concerns that should arise related to todays visit.  Diabetes Counseling:  1. Addition of ACE inh/ ARB'S for nephroprotection. 2. Diabetic foot care, prevention of complications.  3.Exercise and lose weight.  4. Diabetic eye examination, 5. Monitor blood sugar closlely. nutrition counseling.  6.Sign and symptoms of hypoglycemia including shaking sweating,confusion and headaches.   This patient was seen by Leretha Pol, FNP- C in Collaboration with Dr Lavera Guise as a part of collaborative care agreement    Orders Placed This Encounter  Procedures  . Pneumococcal conjugate vaccine 13-valent IM  . Ambulatory referral to Ophthalmology  . POCT HgB A1C     Time spent: 25 Minutes      Dr Lavera Guise Internal medicine

## 2017-08-22 DIAGNOSIS — Z23 Encounter for immunization: Secondary | ICD-10-CM | POA: Insufficient documentation

## 2017-08-22 DIAGNOSIS — Z794 Long term (current) use of insulin: Secondary | ICD-10-CM | POA: Insufficient documentation

## 2017-08-22 DIAGNOSIS — E11649 Type 2 diabetes mellitus with hypoglycemia without coma: Secondary | ICD-10-CM | POA: Insufficient documentation

## 2017-08-22 DIAGNOSIS — N1832 Chronic kidney disease, stage 3b: Secondary | ICD-10-CM | POA: Insufficient documentation

## 2017-08-24 ENCOUNTER — Encounter: Payer: Self-pay | Admitting: Nurse Practitioner

## 2017-08-24 ENCOUNTER — Ambulatory Visit (INDEPENDENT_AMBULATORY_CARE_PROVIDER_SITE_OTHER): Payer: Medicare HMO | Admitting: Nurse Practitioner

## 2017-08-24 VITALS — BP 140/95 | HR 93 | Resp 16 | Ht 62.0 in | Wt 148.4 lb

## 2017-08-24 DIAGNOSIS — E782 Mixed hyperlipidemia: Secondary | ICD-10-CM

## 2017-08-24 DIAGNOSIS — G4489 Other headache syndrome: Secondary | ICD-10-CM | POA: Diagnosis not present

## 2017-08-24 DIAGNOSIS — J0140 Acute pansinusitis, unspecified: Secondary | ICD-10-CM | POA: Diagnosis not present

## 2017-08-24 DIAGNOSIS — E1165 Type 2 diabetes mellitus with hyperglycemia: Secondary | ICD-10-CM | POA: Insufficient documentation

## 2017-08-24 DIAGNOSIS — I10 Essential (primary) hypertension: Secondary | ICD-10-CM

## 2017-08-24 MED ORDER — BUTALBITAL-APAP-CAFFEINE 50-325-40 MG PO TABS: 1.0000 | ORAL_TABLET | Freq: Four times a day (QID) | ORAL | 1 refills | Status: AC | PRN

## 2017-08-24 MED ORDER — FENOFIBRATE 54 MG PO TABS
54.0000 mg | ORAL_TABLET | Freq: Every day | ORAL | 4 refills | Status: DC
Start: 2017-08-24 — End: 2018-08-20

## 2017-08-24 MED ORDER — CLARITHROMYCIN 500 MG PO TABS: 500.0000 mg | ORAL_TABLET | Freq: Two times a day (BID) | ORAL | 0 refills | Status: DC

## 2017-08-24 NOTE — Progress Notes (Addendum)
[facility] [place], [place] [address]  Internal MEDICINE  Office Visit Note  Patient Name: Tiffany Hawkins  [id number]  [id number]  Date of Service: 08/24/2017    Pt is here for a sick visit.  Chief Complaint  Patient presents with  . Headache    throbbing pain radiating to the ear area. been going on for about 5 days     Headache   This is a new problem. The current episode started in the past 7 days. The problem occurs constantly. The problem has been unchanged. The pain is located in the right unilateral and occipital region. The pain does not radiate. The pain quality is not similar to prior headaches. The quality of the pain is described as throbbing. The pain is at a severity of 8/10. The pain is moderate. Associated symptoms include blurred vision, dizziness, ear pain, nausea, rhinorrhea, a visual change and weakness. Pertinent negatives include no back pain, coughing, eye pain, eye redness, fever, photophobia, sore throat or vomiting. The symptoms are aggravated by emotional stress and fatigue. She has tried NSAIDs for the symptoms. The treatment provided mild relief. Her past medical history is significant for cancer and hypertension.       Current Medication:  Outpatient Encounter Medications as of 08/24/2017  Medication Sig  . ALPRAZolam (XANAX) 0.25 MG tablet Take 0.25 mg by mouth 2 (two) times daily as needed for anxiety.  Marland Kitchen amLODipine (NORVASC) 5 MG tablet TAKE 1 TABLET EVERY DAY  . amoxicillin-clavulanate (AUGMENTIN) 875-125 MG tablet Take 1 tablet by mouth 2 (two) times daily.  . Blood Glucose Calibration (ACCU-CHEK SMARTVIEW CONTROL VI) by In Vitro route. Use as di  . Celecoxib (CELEBREX PO) Take by mouth.  . citalopram (CELEXA) 20 MG tablet Take 2 tablets daily for depression  . glucose blood (ACCU-CHEK SMARTVIEW) test strip 1 each by Other route as needed for other. Use as instructed 3 times  . ibuprofen (ADVIL,MOTRIN) 800 MG  tablet Take 800 mg by mouth every 8 (eight) hours as needed.  . insulin aspart protamine - aspart (NOVOLOG MIX 70/30 FLEXPEN) (70-30) 100 UNIT/ML FlexPen Inject 0.15 mLs (15 Units total) into the skin 2 (two) times daily.  Marland Kitchen lidocaine-prilocaine (EMLA) cream Apply 1 application topically as needed.  . lovastatin (MEVACOR) 20 MG tablet Take 20 mg by mouth at bedtime.   . Multiple Vitamin (MULTI-VITAMINS) TABS Take 1 tablet by mouth daily.   . pantoprazole (PROTONIX) 20 MG tablet Take 40 mg by mouth daily.   . Semaglutide (OZEMPIC) 0.25 or 0.5 MG/DOSE SOPN Inject 0.5 mg into the skin once a week.  . [DISCONTINUED] levofloxacin (LEVAQUIN) 500 MG tablet Take 1 tablet (500 mg total) by mouth daily.  . butalbital-acetaminophen-caffeine (FIORICET, ESGIC) 50-325-40 MG tablet Take 1 tablet by mouth every 6 (six) hours as needed for headache.  . clarithromycin (BIAXIN) 500 MG tablet Take 1 tablet (500 mg total) by mouth 2 (two) times daily.  . fenofibrate 54 MG tablet Take 1 tablet (54 mg total) by mouth daily.  . [DISCONTINUED] fenofibrate 54 MG tablet Take 54 mg by mouth daily.    No facility-administered encounter medications on file as of 08/24/2017.       Medical History: Past Medical History:  Diagnosis Date  . Anxiety   . Colon cancer ([place]) 10/12/2014   had chemo  . Diabetes mellitus without complication ([place])   . Hyperlipidemia   . Hypertension   . Lateral epicondylitis   . Personal  history of chemotherapy   . Polio   . Sleep apnea      Vital Signs: BP (!) 140/95 (BP Location: Left Arm, Patient Position: Sitting, Cuff Size: Normal)   Pulse 93   Resp 16   Ht 5\' 2"  (1.575 m)   Wt 148 lb 6.4 oz (67.3 kg)   SpO2 96%   BMI 27.14 kg/m    Review of Systems  Constitutional: Positive for activity change, chills and fatigue. Negative for fever.  HENT: Positive for congestion, ear pain, postnasal drip, rhinorrhea and sinus pain. Negative for sore throat and voice change.   Eyes:  Positive for blurred vision. Negative for photophobia, pain, discharge, redness, itching and visual disturbance.  Respiratory: Negative for cough, chest tightness and wheezing.   Cardiovascular: Negative for chest pain and palpitations.  Gastrointestinal: Positive for nausea. Negative for vomiting.  Endocrine:       Blood sugars doing well   Genitourinary: Negative for frequency, hematuria and urgency.  Musculoskeletal: Positive for myalgias. Negative for arthralgias and back pain.  Skin: Negative for rash.  Allergic/Immunologic: Negative for environmental allergies.  Neurological: Positive for dizziness, weakness and headaches.  Hematological: Positive for adenopathy.  Psychiatric/Behavioral: Positive for dysphoric mood. The patient is nervous/anxious.     Physical Exam  Constitutional: She is oriented to person, place, and time. She appears well-developed and well-nourished. No distress.  HENT:  Head: Normocephalic and atraumatic.  Nose: Right sinus exhibits maxillary sinus tenderness and frontal sinus tenderness. Left sinus exhibits maxillary sinus tenderness and frontal sinus tenderness.  Mouth/Throat: Oropharynx is clear and moist. No oropharyngeal exudate.  Eyes: Pupils are equal, round, and reactive to light. EOM are normal.  Neck: Normal range of motion. Neck supple. No JVD present. No tracheal deviation present. No thyromegaly present.  Cardiovascular: Normal rate, regular rhythm and normal heart sounds. Exam reveals no gallop and no friction rub.  No murmur heard. Pulmonary/Chest: Effort normal. No respiratory distress. She has no wheezes. She has no rales. She exhibits no tenderness.  Abdominal: Soft. Bowel sounds are normal.  Musculoskeletal: Normal range of motion.  Lymphadenopathy:    She has cervical adenopathy.  Neurological: She is alert and oriented to person, place, and time. She has normal strength. She is not disoriented. She displays normal reflexes. No cranial  nerve deficit. She displays a negative Romberg sign. Gait abnormal.  Accessory muscles mildly weak, bilaterally.   Skin: Skin is warm and dry. Capillary refill takes 2 to 3 seconds. She is not diaphoretic.  Psychiatric: Her behavior is normal. Judgment and thought content normal. Her mood appears anxious.  Nursing note and vitals reviewed.   Assessment/Plan: 1. Acute non-recurrent pansinusitis Symptoms cnsistent with acute sinus infection. Treat with biaxin bid for 10 days.  - clarithromycin (BIAXIN) 500 MG tablet; Take 1 tablet (500 mg total) by mouth 2 (two) times daily.  Dispense: 20 tablet; Refill: 0  2. Other headache syndrome fioricet may be taken as needed and as prescribed for acute headache. Will get MRI of brain for further evaluation. Neuro exam normal. Advised patient to be seen in ER if any change is strength or speech or gait. She agrees.  - butalbital-acetaminophen-caffeine (FIORICET, ESGIC) 50-325-40 MG tablet; Take 1 tablet by mouth every 6 (six) hours as needed for headache.  Dispense: 30 tablet; Refill: 1 - MR Brain W Wo Contrast; Future  3. Mixed hyperlipidemia - fenofibrate 54 MG tablet; Take 1 tablet (54 mg total) by mouth daily.  Dispense: 90 tablet; Refill: 4  4. Essential hypertension Continue bp medication as prescribed.   5. Uncontrolled type 2 diabetes mellitus with hyperglycemia ([place]) Continue all diabetic medicatin as prescribed.    General Counseling: olga bourbeau understanding of the findings of todays visit and agrees with plan of treatment. I have discussed any further diagnostic evaluation that may be needed or ordered today. We also reviewed her medications today. she has been encouraged to call the office with any questions or concerns that should arise related to todays visit.  Rest and increase fluids. Continue using OTC medication to control symptoms.   This patient was seen by Leretha Pol, FNP- C in Collaboration with Dr Lavera Guise as  a part of collaborative care agreement    Orders Placed This Encounter  Procedures  . MR Brain W Wo Contrast    Meds ordered this encounter  Medications  . clarithromycin (BIAXIN) 500 MG tablet    Sig: Take 1 tablet (500 mg total) by mouth 2 (two) times daily.    Dispense:  20 tablet    Refill:  0    Order Specific Question:   Supervising Provider    Answer:   Lavera Guise [[id number]]  . butalbital-acetaminophen-caffeine (FIORICET, ESGIC) 50-325-40 MG tablet    Sig: Take 1 tablet by mouth every 6 (six) hours as needed for headache.    Dispense:  30 tablet    Refill:  1    Order Specific Question:   Supervising Provider    Answer:   Lavera Guise [[id number]]  . fenofibrate 54 MG tablet    Sig: Take 1 tablet (54 mg total) by mouth daily.    Dispense:  90 tablet    Refill:  4    Order Specific Question:   Supervising Provider    Answer:   Lavera Guise [[id number]]    Time spent: 20 Minutes

## 2017-08-27 ENCOUNTER — Other Ambulatory Visit: Payer: Self-pay

## 2017-08-27 MED ORDER — SULFAMETHOXAZOLE-TRIMETHOPRIM 800-160 MG PO TABS: 1.0000 | ORAL_TABLET | Freq: Two times a day (BID) | ORAL | 0 refills | Status: DC

## 2017-08-27 NOTE — Telephone Encounter (Signed)
Pt called that Biaxin is not covered by insurance as per heather change to bactrim also pt id having diarrhea as per heather advised pt can take otc diarrhea can take otc imodium and drink lots of water

## 2017-08-28 NOTE — Progress Notes (Signed)
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  INTERVAL HISTORY: Patient returns to clinic today for routine six-month evaluation.  She has had recent severe headaches that is being managed by her primary provider.  She otherwise feels well and is asymptomatic.  She has no other neurologic complaints. She has a good appetite and denies weight loss. She denies any chest pain or shortness of breath. She denies any nausea, vomiting, constipation, or diarrhea. She has no abdominal pain and is noted no changes in her bowel movements. She has no melena or hematochezia.  Patient offers no further specific complaints today.  REVIEW OF SYSTEMS:   Review of Systems  Constitutional: Negative.  Negative for fever, malaise/fatigue and weight loss.  Respiratory: Negative.   Cardiovascular: Negative.  Negative for chest pain and leg swelling.  Gastrointestinal: Negative for abdominal pain, blood in stool, constipation, diarrhea, melena, nausea and vomiting.  Genitourinary: Negative.  Negative for dysuria.  Musculoskeletal: Positive for back pain.  Neurological: Positive for headaches. Negative for dizziness, sensory change, focal weakness and weakness.  Psychiatric/Behavioral: Negative.  The patient is not nervous/anxious.     As per HPI. Otherwise, a complete review of systems is negative.  PAST MEDICAL HISTORY: Past Medical History:  Diagnosis Date  . Anxiety   . Colon cancer ([place]) 10/12/2014   had chemo  . Diabetes mellitus without complication ([place])   . Hyperlipidemia   . Hypertension   . Lateral epicondylitis   . Personal history of chemotherapy   . Polio   . Sleep apnea      PAST SURGICAL HISTORY: Past Surgical History:  Procedure Laterality Date  . APPENDECTOMY    . BACK SURGERY    . CERVICAL DISC SURGERY    . CHOLECYSTECTOMY    . COLON SURGERY    . COLONOSCOPY    . COLONOSCOPY WITH PROPOFOL N/A 05/17/2015   Procedure: COLONOSCOPY WITH PROPOFOL;  Surgeon: Manya Silvas, MD;  Location: [facility] ENDOSCOPY;  Service: Endoscopy;  Laterality: N/A;  . ESOPHAGOGASTRODUODENOSCOPY    . HERNIA REPAIR    . PORT A CATH INJECTION ([facility])    . TUBAL LIGATION      FAMILY HISTORY Family History  Problem Relation Age of Onset  . Alzheimer's disease Mother   . Heart disease Father   . Colon cancer Sister   . Colon cancer Cousin   . Colon cancer Cousin        ADVANCED DIRECTIVES:    HEALTH MAINTENANCE: Social History   Tobacco Use  . Smoking status: Former Smoker    Packs/day: 1.00    Types: Cigarettes    Last attempt to quit: 04/17/1981    Years since quitting: 36.4  . Smokeless tobacco: Never Used  Substance Use Topics  . Alcohol use: No  . Drug use: No    No Known Allergies  Current Outpatient Medications  Medication Sig Dispense Refill  . amLODipine (NORVASC) 5 MG tablet TAKE 1 TABLET EVERY DAY 90 tablet 3  . Blood Glucose Calibration (ACCU-CHEK SMARTVIEW CONTROL VI) by In Vitro route. Use as di    . butalbital-acetaminophen-caffeine (FIORICET, ESGIC) 50-325-40 MG tablet Take 1 tablet by mouth every 6 (six) hours as needed for headache. 30 tablet 1  .  citalopram (CELEXA) 20 MG tablet Take 2 tablets daily for depression 180 tablet 1  . fenofibrate 54 MG tablet Take 1 tablet (54 mg total) by mouth daily. 90 tablet 4  . glucose blood (ACCU-CHEK SMARTVIEW) test strip 1 each by Other route as needed for other. Use as instructed 3 times    . insulin aspart protamine - aspart (NOVOLOG MIX 70/30 FLEXPEN) (70-30) 100 UNIT/ML FlexPen Inject 0.15 mLs (15 Units total) into the skin 2 (two) times daily. 15 mL 11  . lovastatin (MEVACOR) 20 MG tablet Take  20 mg by mouth at bedtime.     . Multiple Vitamin (MULTI-VITAMINS) TABS Take 1 tablet by mouth daily.     . pantoprazole (PROTONIX) 20 MG tablet Take 40 mg by mouth daily.     . Semaglutide (OZEMPIC) 0.25 or 0.5 MG/DOSE SOPN Inject 0.5 mg into the skin once a week. 4 pen 3  . sulfamethoxazole-trimethoprim (BACTRIM DS,SEPTRA DS) 800-160 MG tablet Take 1 tablet by mouth 2 (two) times daily. 20 tablet 0  . ALPRAZolam (XANAX) 0.25 MG tablet Take 0.25 mg by mouth 2 (two) times daily as needed for anxiety.    . Celecoxib (CELEBREX PO) Take by mouth.    Marland Kitchen ibuprofen (ADVIL,MOTRIN) 800 MG tablet Take 800 mg by mouth every 8 (eight) hours as needed.    . lidocaine-prilocaine (EMLA) cream Apply 1 application topically as needed. (Patient not taking: Reported on 08/31/2017) 30 g 0  . ondansetron (ZOFRAN-ODT) 4 MG disintegrating tablet Take by mouth.     No current facility-administered medications for this visit.     OBJECTIVE: Vitals:   08/31/17 1020  BP: (!) 159/98  Pulse: (!) 102  Resp: 18  Temp: (!) 97.3 F (36.3 C)     Body mass index is 27.64 kg/m.    ECOG FS:0 - Asymptomatic  General: Well-developed, well-nourished, no acute distress. Eyes: Pink conjunctiva, anicteric sclera. Lungs: Clear to auscultation bilaterally. Heart: Regular rate and rhythm. No rubs, murmurs, or gallops. Abdomen: Soft, nontender, nondistended. No organomegaly noted, normoactive bowel sounds. Musculoskeletal: No edema, cyanosis, or clubbing. Neuro: Alert, answering all questions appropriately. Cranial nerves grossly intact. Skin: No rashes or petechiae noted. Psych: Normal affect.  LAB RESULTS:  Lab Results  Component Value Date   NA 138 02/16/2017   K 3.6 02/16/2017   CL 103 02/16/2017   CO2 26 02/16/2017   GLUCOSE 151 (H) 02/16/2017   BUN 33 (H) 02/16/2017   CREATININE 1.19 (H) 02/16/2017   CALCIUM 8.8 (L) 02/16/2017   PROT 7.5 02/16/2017   ALBUMIN 4.4 02/16/2017   AST 29 02/16/2017   ALT 35  02/16/2017   ALKPHOS 56 02/16/2017   BILITOT 0.9 02/16/2017   GFRNONAA 44 (L) 02/16/2017   GFRAA 52 (L) 02/16/2017    Lab Results  Component Value Date   WBC 9.2 08/29/2017   NEUTROABS 5.5 08/29/2017   HGB 15.3 08/29/2017   HCT 42.6 08/29/2017   MCV 85.0 08/29/2017   PLT 190 08/29/2017   Lab Results  Component Value Date   CEA 2.3 09/01/2016     STUDIES: No results found.  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 She had a CT scan completed March 2016 which revealed no evidence  of disease. No further imaging is necessary unless there is suspicion of recurrence.  Her most recent CEA continues to be within normal limits at 2.4.  Colonoscopy on May 17, 2015 was negative for malignancy.  Repeat in January 2020.  Return to clinic in 6 months for repeat laboratory work and further evaluation. 2.  Headaches: Patient reports she has declined imaging studies.  Continue treatment and evaluation per primary care.  Approximately 20 minutes was spent in discussion of which greater than 50% was consultation.  Patient expressed understanding and was in agreement with this plan. She also understands that She can call clinic at any time with any questions, concerns, or complaints.   Colon cancer [facility])   Staging form: Colon and Rectum, AJCC 7th Edition     Clinical stage from 10/12/2011: Stage IIB (T4a, N0, M0) - Signed by Evlyn Kanner, NP on 10/12/2014   Lloyd Huger, MD   09/01/2017 10:41 AM

## 2017-08-29 ENCOUNTER — Other Ambulatory Visit: Payer: Medicare HMO

## 2017-08-29 ENCOUNTER — Other Ambulatory Visit: Payer: Self-pay | Admitting: *Deleted

## 2017-08-29 ENCOUNTER — Inpatient Hospital Stay: Payer: Medicare HMO | Attending: Oncology

## 2017-08-29 DIAGNOSIS — Z87891 Personal history of nicotine dependence: Secondary | ICD-10-CM | POA: Diagnosis not present

## 2017-08-29 DIAGNOSIS — Z9221 Personal history of antineoplastic chemotherapy: Secondary | ICD-10-CM | POA: Insufficient documentation

## 2017-08-29 DIAGNOSIS — R51 Headache: Secondary | ICD-10-CM | POA: Diagnosis not present

## 2017-08-29 DIAGNOSIS — E119 Type 2 diabetes mellitus without complications: Secondary | ICD-10-CM | POA: Insufficient documentation

## 2017-08-29 DIAGNOSIS — Z79899 Other long term (current) drug therapy: Secondary | ICD-10-CM | POA: Diagnosis not present

## 2017-08-29 DIAGNOSIS — Z794 Long term (current) use of insulin: Secondary | ICD-10-CM | POA: Diagnosis not present

## 2017-08-29 DIAGNOSIS — Z800 Family history of malignant neoplasm of digestive organs: Secondary | ICD-10-CM | POA: Insufficient documentation

## 2017-08-29 DIAGNOSIS — C189 Malignant neoplasm of colon, unspecified: Secondary | ICD-10-CM

## 2017-08-29 DIAGNOSIS — Z85038 Personal history of other malignant neoplasm of large intestine: Secondary | ICD-10-CM | POA: Insufficient documentation

## 2017-08-29 DIAGNOSIS — Z923 Personal history of irradiation: Secondary | ICD-10-CM | POA: Insufficient documentation

## 2017-08-29 DIAGNOSIS — Z452 Encounter for adjustment and management of vascular access device: Secondary | ICD-10-CM | POA: Diagnosis present

## 2017-08-29 LAB — CBC WITH DIFFERENTIAL/PLATELET
BASOS ABS: 0.1 10*3/uL (ref 0–0.1)
BASOS PCT: 1 %
Eosinophils Absolute: 0.4 10*3/uL (ref 0–0.7)
Eosinophils Relative: 4 %
HCT: 42.6 % (ref 35.0–47.0)
HEMOGLOBIN: 15.3 g/dL (ref 12.0–16.0)
LYMPHS PCT: 27 %
Lymphs Abs: 2.5 10*3/uL (ref 1.0–3.6)
MCH: 30.6 pg (ref 26.0–34.0)
MCHC: 35.9 g/dL (ref 32.0–36.0)
MCV: 85 fL (ref 80.0–100.0)
MONOS PCT: 9 %
Monocytes Absolute: 0.8 10*3/uL (ref 0.2–0.9)
NEUTROS ABS: 5.5 10*3/uL (ref 1.4–6.5)
NEUTROS PCT: 59 %
Platelets: 190 10*3/uL (ref 150–440)
RBC: 5.01 MIL/uL (ref 3.80–5.20)
RDW: 14 % (ref 11.5–14.5)
WBC: 9.2 10*3/uL (ref 3.6–11.0)

## 2017-08-29 MED ORDER — SODIUM CHLORIDE 0.9% FLUSH
10.0000 mL | INTRAVENOUS | Status: DC | PRN
  Administered 2017-08-29: 10 mL via INTRAVENOUS
  Filled 2017-08-29: qty 10

## 2017-08-29 MED ORDER — HEPARIN SOD (PORK) LOCK FLUSH 100 UNIT/ML IV SOLN
500.0000 [IU] | Freq: Once | INTRAVENOUS | Status: AC
  Administered 2017-08-29: 500 [IU] via INTRAVENOUS

## 2017-08-30 LAB — CEA: CEA: 2.4 ng/mL (ref 0.0–4.7)

## 2017-08-31 ENCOUNTER — Other Ambulatory Visit: Payer: Self-pay

## 2017-08-31 ENCOUNTER — Inpatient Hospital Stay (HOSPITAL_BASED_OUTPATIENT_CLINIC_OR_DEPARTMENT_OTHER): Payer: Medicare HMO | Admitting: Oncology

## 2017-08-31 VITALS — BP 159/98 | HR 102 | Temp 97.3°F | Resp 18 | Wt 151.1 lb

## 2017-08-31 DIAGNOSIS — Z79899 Other long term (current) drug therapy: Secondary | ICD-10-CM

## 2017-08-31 DIAGNOSIS — Z87891 Personal history of nicotine dependence: Secondary | ICD-10-CM

## 2017-08-31 DIAGNOSIS — Z794 Long term (current) use of insulin: Secondary | ICD-10-CM | POA: Diagnosis not present

## 2017-08-31 DIAGNOSIS — Z800 Family history of malignant neoplasm of digestive organs: Secondary | ICD-10-CM

## 2017-08-31 DIAGNOSIS — Z9221 Personal history of antineoplastic chemotherapy: Secondary | ICD-10-CM | POA: Diagnosis not present

## 2017-08-31 DIAGNOSIS — Z923 Personal history of irradiation: Secondary | ICD-10-CM | POA: Diagnosis not present

## 2017-08-31 DIAGNOSIS — R51 Headache: Secondary | ICD-10-CM | POA: Diagnosis not present

## 2017-08-31 DIAGNOSIS — Z85038 Personal history of other malignant neoplasm of large intestine: Secondary | ICD-10-CM | POA: Diagnosis not present

## 2017-08-31 DIAGNOSIS — E119 Type 2 diabetes mellitus without complications: Secondary | ICD-10-CM

## 2017-08-31 DIAGNOSIS — C182 Malignant neoplasm of ascending colon: Secondary | ICD-10-CM

## 2017-08-31 NOTE — Progress Notes (Signed)
Here for follow up. Per pt has had an occipital region headache for 2 weeks. Saw [employer] PA and given Fioricet and abx. Per pt she feels like she is " getting a little better "  Stated she is doing well-just getting over episode of diarrhea lasting 1 week. Otherwise moving bowels once per day.

## 2017-09-06 ENCOUNTER — Telehealth: Payer: Self-pay | Admitting: Nurse Practitioner

## 2017-09-06 NOTE — Telephone Encounter (Signed)
Pt called  Stating that for two weeks her blood sugar has been running between 175-235. She stated she takes ozempic but she hadnt took it since last week until today. (she took one dose). Pt also stated that her blood pressure has been elevated for the past two weeks (diastolic number has been ranging from 90-100's). She stopped taking the antibiotic prescribed and the headache medication that was provided at the last visit because the throbbing for the headache has stopped its just a dull feeling now. Per FNP, Leretha Pol I advised the pt to continue taking the ozempic to control her blood sugar levels and to discontinue the antibiotic if that's what's making her have headaches. Also if her blood pressure doesn't get any better by the weekend and the diastolic number is still elevated to go to the emergency room or an urgent care, if it gets better and she still feels like she need to be seen to call and make an appointment to be seen here at the office.

## 2017-09-11 ENCOUNTER — Other Ambulatory Visit: Payer: Self-pay | Admitting: Nurse Practitioner

## 2017-09-11 ENCOUNTER — Telehealth: Payer: Self-pay

## 2017-09-11 DIAGNOSIS — E1165 Type 2 diabetes mellitus with hyperglycemia: Secondary | ICD-10-CM

## 2017-09-11 NOTE — Telephone Encounter (Signed)
Called to let them know the orders are in.

## 2017-09-11 NOTE — Telephone Encounter (Signed)
Put in order for BMP to check renal functions.

## 2017-09-11 NOTE — Progress Notes (Signed)
Put in order for BMP to check renal functions.

## 2017-09-12 ENCOUNTER — Other Ambulatory Visit
Admission: RE | Admit: 2017-09-12 | Discharge: 2017-09-12 | Disposition: A | Discharge status: Home / Self Care | Payer: Medicare HMO | Source: Ambulatory Visit | Attending: Nurse Practitioner | Admitting: Nurse Practitioner

## 2017-09-12 ENCOUNTER — Ambulatory Visit
Admission: RE | Admit: 2017-09-12 | Discharge: 2017-09-12 | Disposition: A | Discharge status: Home / Self Care | Payer: Medicare HMO | Source: Ambulatory Visit | Attending: Nurse Practitioner | Admitting: Nurse Practitioner

## 2017-09-12 DIAGNOSIS — G4489 Other headache syndrome: Secondary | ICD-10-CM | POA: Diagnosis not present

## 2017-09-12 DIAGNOSIS — E1165 Type 2 diabetes mellitus with hyperglycemia: Secondary | ICD-10-CM | POA: Insufficient documentation

## 2017-09-12 DIAGNOSIS — G319 Degenerative disease of nervous system, unspecified: Secondary | ICD-10-CM | POA: Insufficient documentation

## 2017-09-12 DIAGNOSIS — R42 Dizziness and giddiness: Secondary | ICD-10-CM | POA: Diagnosis not present

## 2017-09-12 LAB — BASIC METABOLIC PANEL
Anion gap: 10 (ref 5–15)
BUN: 19 mg/dL (ref 6–20)
CHLORIDE: 103 mmol/L (ref 101–111)
CO2: 23 mmol/L (ref 22–32)
Calcium: 9 mg/dL (ref 8.9–10.3)
Creatinine, Ser: 0.9 mg/dL (ref 0.44–1.00)
GFR calc Af Amer: >60 mL/min (ref >60)
GFR calc non Af Amer: >60 mL/min (ref >60)
GLUCOSE: 187 mg/dL — AB (ref 65–99)
POTASSIUM: 3.9 mmol/L (ref 3.5–5.1)
SODIUM: 136 mmol/L (ref 135–145)

## 2017-09-12 MED ORDER — GADOBENATE DIMEGLUMINE 529 MG/ML IV SOLN
14.0000 mL | Freq: Once | INTRAVENOUS | Status: AC | PRN
  Administered 2017-09-12: 15 mL via INTRAVENOUS

## 2017-09-12 NOTE — Progress Notes (Signed)
Pt was notified and to continue to take medication regularly.

## 2017-09-12 NOTE — Progress Notes (Signed)
Please let the patient know that her labs looked good, except sugar was elevated. All other values were normal. Thanks.

## 2017-09-13 ENCOUNTER — Telehealth: Payer: Self-pay

## 2017-09-13 NOTE — Telephone Encounter (Signed)
Called pt about MRI results and she voiced her concern about her BP readings being too high. She said her BP was running high and she was checking BP before and after taking medications.   Spoke with Leretha Pol and she said BP readings should be taken 3-4 hours after taken medications.   Let pt know.

## 2017-09-13 NOTE — Progress Notes (Signed)
Pt was notified. White matter changes are normal for aging.

## 2017-09-14 DIAGNOSIS — E113293 Type 2 diabetes mellitus with mild nonproliferative diabetic retinopathy without macular edema, bilateral: Secondary | ICD-10-CM | POA: Diagnosis not present

## 2017-10-15 DIAGNOSIS — Z1283 Encounter for screening for malignant neoplasm of skin: Secondary | ICD-10-CM | POA: Diagnosis not present

## 2017-10-15 DIAGNOSIS — D239 Other benign neoplasm of skin, unspecified: Secondary | ICD-10-CM | POA: Diagnosis not present

## 2017-10-15 DIAGNOSIS — L680 Hirsutism: Secondary | ICD-10-CM | POA: Diagnosis not present

## 2017-10-15 DIAGNOSIS — Z872 Personal history of diseases of the skin and subcutaneous tissue: Secondary | ICD-10-CM | POA: Diagnosis not present

## 2017-10-15 DIAGNOSIS — L7211 Pilar cyst: Secondary | ICD-10-CM | POA: Diagnosis not present

## 2017-10-15 DIAGNOSIS — L570 Actinic keratosis: Secondary | ICD-10-CM | POA: Diagnosis not present

## 2017-10-15 DIAGNOSIS — L821 Other seborrheic keratosis: Secondary | ICD-10-CM | POA: Diagnosis not present

## 2017-10-15 DIAGNOSIS — L578 Other skin changes due to chronic exposure to nonionizing radiation: Secondary | ICD-10-CM | POA: Diagnosis not present

## 2017-10-19 ENCOUNTER — Inpatient Hospital Stay: Payer: Medicare HMO | Attending: Oncology

## 2017-11-06 ENCOUNTER — Encounter: Payer: Self-pay | Admitting: Nurse Practitioner

## 2017-11-06 ENCOUNTER — Ambulatory Visit (INDEPENDENT_AMBULATORY_CARE_PROVIDER_SITE_OTHER): Payer: Medicare HMO | Admitting: Nurse Practitioner

## 2017-11-06 VITALS — BP 145/83 | HR 94 | Resp 16 | Ht 61.5 in | Wt 149.6 lb

## 2017-11-06 DIAGNOSIS — E1165 Type 2 diabetes mellitus with hyperglycemia: Secondary | ICD-10-CM

## 2017-11-06 DIAGNOSIS — I10 Essential (primary) hypertension: Secondary | ICD-10-CM | POA: Diagnosis not present

## 2017-11-06 DIAGNOSIS — E782 Mixed hyperlipidemia: Secondary | ICD-10-CM | POA: Diagnosis not present

## 2017-11-06 LAB — POCT GLYCOSYLATED HEMOGLOBIN (HGB A1C): Hemoglobin A1C: 6.6 % — AB (ref 4.0–5.6)

## 2017-11-06 MED ORDER — GLUCOSE BLOOD VI STRP: ORAL_STRIP | 5 refills | Status: DC

## 2017-11-06 MED ORDER — SHARPS CONTAINER MISC: 1.0000 | 5 refills | Status: AC | PRN

## 2017-11-06 NOTE — Progress Notes (Signed)
[facility] [place], [place] [address]  Internal MEDICINE  Office Visit Note  Patient Name: Tiffany Hawkins  [id number]  [id number]  Date of Service: 11/25/2017  Chief Complaint  Patient presents with  . Diabetes    3 month follow up    Diabetes  She presents for her follow-up diabetic visit. She has type 2 diabetes mellitus. Her disease course has been stable. Hypoglycemia symptoms include dizziness, headaches and nervousness/anxiousness. Associated symptoms include foot paresthesias, visual change and weakness. Pertinent negatives for diabetes include no chest pain and no fatigue. There are no hypoglycemic complications. Symptoms are stable. There are no diabetic complications. Risk factors for coronary artery disease include diabetes mellitus, dyslipidemia and post-menopausal. Current diabetic treatment includes insulin injections. She is compliant with treatment most of the time. Her weight is stable. She is following a generally healthy diet. Meal planning includes avoidance of concentrated sweets. She has not had a previous visit with a dietitian. She participates in exercise intermittently. There is no change in her home blood glucose trend. An ACE inhibitor/angiotensin II receptor blocker is not being taken. She does not see a podiatrist.Eye exam is current.       Current Medication: Outpatient Encounter Medications as of 11/06/2017  Medication Sig  . ALPRAZolam (XANAX) 0.25 MG tablet Take 0.25 mg by mouth 2 (two) times daily as needed for anxiety.  Marland Kitchen amLODipine (NORVASC) 5 MG tablet TAKE 1 TABLET EVERY DAY  . Blood Glucose Calibration (ACCU-CHEK SMARTVIEW CONTROL VI) by In Vitro route. Use as di  . butalbital-acetaminophen-caffeine (FIORICET, ESGIC) 50-325-40 MG tablet Take 1 tablet by mouth every 6 (six) hours as needed for headache.  . Celecoxib (CELEBREX PO) Take by mouth.  . citalopram (CELEXA) 20 MG tablet Take 2 tablets daily for  depression  . fenofibrate 54 MG tablet Take 1 tablet (54 mg total) by mouth daily.  Marland Kitchen glucose blood (ACCU-CHEK SMARTVIEW) test strip Blood sugar checks TID  . ibuprofen (ADVIL,MOTRIN) 800 MG tablet Take 800 mg by mouth every 8 (eight) hours as needed.  . insulin aspart protamine - aspart (NOVOLOG MIX 70/30 FLEXPEN) (70-30) 100 UNIT/ML FlexPen Inject 0.15 mLs (15 Units total) into the skin 2 (two) times daily.  Marland Kitchen lidocaine-prilocaine (EMLA) cream Apply 1 application topically as needed.  . lovastatin (MEVACOR) 20 MG tablet Take 20 mg by mouth at bedtime.   . Multiple Vitamin (MULTI-VITAMINS) TABS Take 1 tablet by mouth daily.   . ondansetron (ZOFRAN-ODT) 4 MG disintegrating tablet Take by mouth.  . pantoprazole (PROTONIX) 20 MG tablet Take 40 mg by mouth daily.   . Semaglutide (OZEMPIC) 0.25 or 0.5 MG/DOSE SOPN Inject 0.5 mg into the skin once a week.  . [DISCONTINUED] glucose blood (ACCU-CHEK SMARTVIEW) test strip 1 each by Other route as needed for other. Use as instructed 3 times  . [DISCONTINUED] sulfamethoxazole-trimethoprim (BACTRIM DS,SEPTRA DS) 800-160 MG tablet Take 1 tablet by mouth 2 (two) times daily.  . sharps container 1 each by Does not apply route as needed. To use for insulin syringes and needles. Taking insulin at least twice daily.  E11.65   No facility-administered encounter medications on file as of 11/06/2017.     Surgical History: Past Surgical History:  Procedure Laterality Date  . APPENDECTOMY    . BACK SURGERY    . CERVICAL DISC SURGERY    . CHOLECYSTECTOMY    . COLON SURGERY    . COLONOSCOPY    . COLONOSCOPY WITH PROPOFOL N/A 05/17/2015  Procedure: COLONOSCOPY WITH PROPOFOL;  Surgeon: Manya Silvas, MD;  Location: [facility] ENDOSCOPY;  Service: Endoscopy;  Laterality: N/A;  . ESOPHAGOGASTRODUODENOSCOPY    . HERNIA REPAIR    . PORT A CATH INJECTION ([place] HX)    . TUBAL LIGATION      Medical History: Past Medical History:  Diagnosis Date  . Anxiety   .  Colon cancer ([place]) 10/12/2014   had chemo  . Diabetes mellitus without complication ([place])   . Hyperlipidemia   . Hypertension   . Lateral epicondylitis   . Personal history of chemotherapy   . Polio   . Sleep apnea     Family History: Family History  Problem Relation Age of Onset  . Alzheimer's disease Mother   . Heart disease Father   . Colon cancer Sister   . Colon cancer Cousin   . Colon cancer Cousin     Social History   Socioeconomic History  . Marital status: Married    Spouse name: Not on file  . Number of children: Not on file  . Years of education: Not on file  . Highest education level: Not on file  Occupational History  . Not on file  Social Needs  . Financial resource strain: Not on file  . Food insecurity:    Worry: Not on file    Inability: Not on file  . Transportation needs:    Medical: Not on file    Non-medical: Not on file  Tobacco Use  . Smoking status: Former Smoker    Packs/day: 1.00    Types: Cigarettes    Last attempt to quit: 04/17/1981    Years since quitting: 36.6  . Smokeless tobacco: Never Used  Substance and Sexual Activity  . Alcohol use: No  . Drug use: No  . Sexual activity: Never  Lifestyle  . Physical activity:    Days per week: Not on file    Minutes per session: Not on file  . Stress: Not on file  Relationships  . Social connections:    Talks on phone: Not on file    Gets together: Not on file    Attends religious service: Not on file    Active member of club or organization: Not on file    Attends meetings of clubs or organizations: Not on file    Relationship status: Not on file  . Intimate partner violence:    Fear of current or ex partner: Not on file    Emotionally abused: Not on file    Physically abused: Not on file    Forced sexual activity: Not on file  Other Topics Concern  . Not on file  Social History Narrative  . Not on file      Review of Systems  Constitutional: Negative for activity change,  chills, fatigue and fever.  HENT: Negative for congestion, ear pain, postnasal drip, rhinorrhea, sinus pain, sore throat and voice change.   Eyes: Negative for photophobia, pain, discharge, redness, itching and visual disturbance.       New diagnosis of cataracts. Will schedule to have them removed in upcoming months.   Respiratory: Negative for cough, chest tightness and wheezing.   Cardiovascular: Negative for chest pain and palpitations.  Gastrointestinal: Positive for nausea. Negative for vomiting.  Endocrine:       Blood sugars doing well   Genitourinary: Negative.  Negative for frequency, hematuria and urgency.  Musculoskeletal: Positive for myalgias. Negative for arthralgias and back pain.  Skin: Negative  for rash.  Allergic/Immunologic: Negative for environmental allergies.  Neurological: Positive for dizziness, weakness and headaches.  Hematological: Positive for adenopathy.  Psychiatric/Behavioral: Positive for dysphoric mood. The patient is nervous/anxious.     Today's Vitals   11/06/17 1438  BP: (!) 145/83  Pulse: 94  Resp: 16  SpO2: 96%  Weight: 149 lb 9.6 oz (67.9 kg)  Height: 5' 1.5" (1.562 m)    Physical Exam  Constitutional: She is oriented to person, place, and time. She appears well-developed and well-nourished. No distress.  HENT:  Head: Normocephalic and atraumatic.  Nose: Nose normal. Right sinus exhibits no maxillary sinus tenderness and no frontal sinus tenderness. Left sinus exhibits no maxillary sinus tenderness and no frontal sinus tenderness.  Mouth/Throat: Oropharynx is clear and moist. No oropharyngeal exudate.  Eyes: Pupils are equal, round, and reactive to light. Conjunctivae and EOM are normal.  Neck: Normal range of motion. Neck supple. No JVD present. No tracheal deviation present. No thyromegaly present.  Cardiovascular: Normal rate, regular rhythm and normal heart sounds. Exam reveals no gallop and no friction rub.  No murmur  heard. Pulmonary/Chest: Effort normal and breath sounds normal. No respiratory distress. She has no wheezes. She has no rales. She exhibits no tenderness.  Abdominal: Soft. Bowel sounds are normal. There is no tenderness.  Musculoskeletal: Normal range of motion.  Lymphadenopathy:    She has no cervical adenopathy.  Neurological: She is alert and oriented to person, place, and time. She has normal strength. She is not disoriented. She displays normal reflexes. No cranial nerve deficit. She displays a negative Romberg sign. Gait abnormal.  Accessory muscles mildly weak, bilaterally.   Skin: Skin is warm and dry. Capillary refill takes 2 to 3 seconds. She is not diaphoretic.  Psychiatric: Her behavior is normal. Judgment and thought content normal. Her mood appears anxious.  Nursing note and vitals reviewed.  Assessment/Plan: 1. Uncontrolled type 2 diabetes mellitus with hyperglycemia (HCC) - POCT HgB A1C 6.6 today. Continue diabetic medication as prescribed  - glucose blood (ACCU-CHEK SMARTVIEW) test strip; Blood sugar checks TID  Dispense: 100 each; Refill: 5 - sharps container; 1 each by Does not apply route as needed. To use for insulin syringes and needles. Taking insulin at least twice daily.  E11.65  Dispense: 1 each; Refill: 5  2. Essential hypertension Stable. Continue bp medication as prescribed   3. Mixed hyperlipidemia Continue statin as prescribed   General Counseling: belle charlie understanding of the findings of todays visit and agrees with plan of treatment. I have discussed any further diagnostic evaluation that may be needed or ordered today. We also reviewed her medications today. she has been encouraged to call the office with any questions or concerns that should arise related to todays visit.  Diabetes Counseling:  1. Addition of ACE inh/ ARB'S for nephroprotection. Microalbumin is updated  2. Diabetic foot care, prevention of complications. Podiatry consult 3.  Exercise and lose weight.  4. Diabetic eye examination, Diabetic eye exam is updated  5. Monitor blood sugar closlely. nutrition counseling.  6. Sign and symptoms of hypoglycemia including shaking sweating,confusion and headaches.   This patient was seen by Leretha Pol FNP Collaboration with Dr Lavera Guise as a part of collaborative care agreement  Orders Placed This Encounter  Procedures  . POCT HgB A1C    Meds ordered this encounter  Medications  . glucose blood (ACCU-CHEK SMARTVIEW) test strip    Sig: Blood sugar checks TID    Dispense:  100  each    Refill:  5    Order Specific Question:   Supervising Provider    Answer:   Lavera Guise [[id number]]  . sharps container    Sig: 1 each by Does not apply route as needed. To use for insulin syringes and needles. Taking insulin at least twice daily.  E11.65    Dispense:  1 each    Refill:  5    Order Specific Question:   Supervising Provider    Answer:   Lavera Guise [[id number]]    Time spent: 61 Minutes      Dr Lavera Guise Internal medicine

## 2017-11-19 DIAGNOSIS — H2512 Age-related nuclear cataract, left eye: Secondary | ICD-10-CM | POA: Diagnosis not present

## 2017-11-26 ENCOUNTER — Encounter: Payer: Self-pay | Admitting: *Deleted

## 2017-11-26 ENCOUNTER — Other Ambulatory Visit: Payer: Self-pay

## 2017-11-29 NOTE — Discharge Instructions (Signed)

## 2017-11-30 ENCOUNTER — Inpatient Hospital Stay: Payer: Medicare HMO | Attending: Oncology

## 2017-12-03 ENCOUNTER — Ambulatory Visit: Payer: Medicare HMO | Admitting: Anesthesiology

## 2017-12-03 ENCOUNTER — Ambulatory Visit
Admission: RE | Admit: 2017-12-03 | Discharge: 2017-12-03 | Disposition: A | Discharge status: Home / Self Care | Payer: Medicare HMO | Source: Ambulatory Visit | Attending: Ophthalmology | Admitting: Ophthalmology

## 2017-12-03 ENCOUNTER — Encounter
Admission: RE | Disposition: A | Discharge status: Home / Self Care | Payer: Self-pay | Source: Ambulatory Visit | Attending: Ophthalmology

## 2017-12-03 DIAGNOSIS — K219 Gastro-esophageal reflux disease without esophagitis: Secondary | ICD-10-CM | POA: Insufficient documentation

## 2017-12-03 DIAGNOSIS — Z79899 Other long term (current) drug therapy: Secondary | ICD-10-CM | POA: Insufficient documentation

## 2017-12-03 DIAGNOSIS — G4730 Sleep apnea, unspecified: Secondary | ICD-10-CM | POA: Insufficient documentation

## 2017-12-03 DIAGNOSIS — I10 Essential (primary) hypertension: Secondary | ICD-10-CM | POA: Insufficient documentation

## 2017-12-03 DIAGNOSIS — Z794 Long term (current) use of insulin: Secondary | ICD-10-CM | POA: Diagnosis not present

## 2017-12-03 DIAGNOSIS — E119 Type 2 diabetes mellitus without complications: Secondary | ICD-10-CM | POA: Diagnosis not present

## 2017-12-03 DIAGNOSIS — H2512 Age-related nuclear cataract, left eye: Secondary | ICD-10-CM | POA: Diagnosis not present

## 2017-12-03 DIAGNOSIS — E7800 Pure hypercholesterolemia, unspecified: Secondary | ICD-10-CM | POA: Diagnosis not present

## 2017-12-03 DIAGNOSIS — Z9221 Personal history of antineoplastic chemotherapy: Secondary | ICD-10-CM | POA: Diagnosis not present

## 2017-12-03 DIAGNOSIS — Z87891 Personal history of nicotine dependence: Secondary | ICD-10-CM | POA: Insufficient documentation

## 2017-12-03 DIAGNOSIS — F419 Anxiety disorder, unspecified: Secondary | ICD-10-CM | POA: Diagnosis not present

## 2017-12-03 DIAGNOSIS — Z9049 Acquired absence of other specified parts of digestive tract: Secondary | ICD-10-CM | POA: Diagnosis not present

## 2017-12-03 DIAGNOSIS — Z85038 Personal history of other malignant neoplasm of large intestine: Secondary | ICD-10-CM | POA: Insufficient documentation

## 2017-12-03 DIAGNOSIS — H25812 Combined forms of age-related cataract, left eye: Secondary | ICD-10-CM | POA: Diagnosis not present

## 2017-12-03 HISTORY — PX: CATARACT EXTRACTION W/PHACO: SHX586

## 2017-12-03 LAB — GLUCOSE, CAPILLARY
GLUCOSE-CAPILLARY: 159 mg/dL — AB (ref 70–99)
Glucose-Capillary: 178 mg/dL — ABNORMAL HIGH (ref 70–99)

## 2017-12-03 SURGERY — PHACOEMULSIFICATION, CATARACT, WITH IOL INSERTION
Anesthesia: Monitor Anesthesia Care | Site: Eye | Laterality: Left | Wound class: "Clean "

## 2017-12-03 MED ORDER — MOXIFLOXACIN HCL 0.5 % OP SOLN
OPHTHALMIC | Status: DC | PRN
  Administered 2017-12-03: 0.2 mL via OPHTHALMIC

## 2017-12-03 MED ORDER — MIDAZOLAM HCL 2 MG/2ML IJ SOLN
INTRAMUSCULAR | Status: DC | PRN
  Administered 2017-12-03 (×2): 1 mg via INTRAVENOUS

## 2017-12-03 MED ORDER — LIDOCAINE HCL (PF) 2 % IJ SOLN
INTRAMUSCULAR | Status: DC | PRN
  Administered 2017-12-03: 1 mL via INTRAOCULAR

## 2017-12-03 MED ORDER — SODIUM HYALURONATE 23 MG/ML IO SOLN
INTRAOCULAR | Status: DC | PRN
  Administered 2017-12-03: 0.6 mL via INTRAOCULAR

## 2017-12-03 MED ORDER — ACETAMINOPHEN 325 MG PO TABS: 325.0000 mg | ORAL_TABLET | ORAL | Status: DC | PRN

## 2017-12-03 MED ORDER — SODIUM HYALURONATE 10 MG/ML IO SOLN
INTRAOCULAR | Status: DC | PRN
  Administered 2017-12-03: 0.55 mL via INTRAOCULAR

## 2017-12-03 MED ORDER — ACETAMINOPHEN 160 MG/5ML PO SOLN: 325.0000 mg | ORAL | Status: DC | PRN

## 2017-12-03 MED ORDER — FENTANYL CITRATE (PF) 100 MCG/2ML IJ SOLN
INTRAMUSCULAR | Status: DC | PRN
  Administered 2017-12-03 (×2): 50 ug via INTRAVENOUS

## 2017-12-03 MED ORDER — ARMC OPHTHALMIC DILATING DROPS
1.0000 "application " | OPHTHALMIC | Status: DC | PRN
  Administered 2017-12-03 (×3): 1 via OPHTHALMIC

## 2017-12-03 MED ORDER — EPINEPHRINE PF 1 MG/ML IJ SOLN
INTRAOCULAR | Status: DC | PRN
  Administered 2017-12-03: 94 mL via OPHTHALMIC

## 2017-12-03 SURGICAL SUPPLY — 17 items
CANNULA ANT/CHMB 27G (MISCELLANEOUS) ×1 IMPLANT
CANNULA ANT/CHMB 27GA (MISCELLANEOUS) ×3 IMPLANT
DISSECTOR HYDRO NUCLEUS 50X22 (MISCELLANEOUS) ×3 IMPLANT
GLOVE BIO SURGEON STRL SZ8 (GLOVE) ×3 IMPLANT
GLOVE SURG LX 7.5 STRW (GLOVE) ×2
GLOVE SURG LX STRL 7.5 STRW (GLOVE) ×1 IMPLANT
GOWN STRL REUS W/ TWL LRG LVL3 (GOWN DISPOSABLE) ×2 IMPLANT
GOWN STRL REUS W/TWL LRG LVL3 (GOWN DISPOSABLE) ×4
LENS IOL TECNIS ITEC 24.0 (Intraocular Lens) ×2 IMPLANT
MARKER SKIN DUAL TIP RULER LAB (MISCELLANEOUS) ×3 IMPLANT
PACK CATARACT (MISCELLANEOUS) ×3 IMPLANT
PACK DR. KING ARMS (PACKS) ×3 IMPLANT
PACK EYE AFTER SURG (MISCELLANEOUS) ×3 IMPLANT
SYR 3ML LL SCALE MARK (SYRINGE) ×3 IMPLANT
SYR TB 1ML LUER SLIP (SYRINGE) ×3 IMPLANT
WATER STERILE IRR 500ML POUR (IV SOLUTION) ×3 IMPLANT
WIPE NON LINTING 3.25X3.25 (MISCELLANEOUS) ×3 IMPLANT

## 2017-12-03 NOTE — Anesthesia Preprocedure Evaluation (Signed)
Anesthesia Evaluation  Patient identified by MRN, date of birth, ID band Patient awake    Reviewed: Allergy & Precautions, H&P , NPO status , Patient's Chart, lab work & pertinent test results, reviewed documented beta blocker date and time   Airway Mallampati: II  TM Distance: >3 FB Neck ROM: full    Dental no notable dental hx.    Pulmonary sleep apnea , former smoker,    Pulmonary exam normal breath sounds clear to auscultation       Cardiovascular Exercise Tolerance: Good hypertension,  Rhythm:regular Rate:Normal     Neuro/Psych negative neurological ROS  negative psych ROS   GI/Hepatic negative GI ROS, Neg liver ROS,   Endo/Other  diabetes, Type 2, Insulin Dependent  Renal/GU negative Renal ROS  negative genitourinary   Musculoskeletal   Abdominal   Peds  Hematology Hx of colon CA, s/p chemo in 2016   Anesthesia Other Findings   Reproductive/Obstetrics negative OB ROS                             Anesthesia Physical Anesthesia Plan  ASA: II  Anesthesia Plan: MAC   Post-op Pain Management:    Induction:   PONV Risk Score and Plan:   Airway Management Planned:   Additional Equipment:   Intra-op Plan:   Post-operative Plan:   Informed Consent: I have reviewed the patients History and Physical, chart, labs and discussed the procedure including the risks, benefits and alternatives for the proposed anesthesia with the patient or authorized representative who has indicated his/her understanding and acceptance.   Dental Advisory Given  Plan Discussed with: CRNA  Anesthesia Plan Comments:         Anesthesia Quick Evaluation

## 2017-12-03 NOTE — Op Note (Signed)
OPERATIVE NOTE  Tiffany Hawkins [id number] 12/03/2017   PREOPERATIVE DIAGNOSIS:  Nuclear sclerotic cataract left eye.  H25.12   POSTOPERATIVE DIAGNOSIS:    Nuclear sclerotic cataract left eye.     PROCEDURE:  Phacoemusification with posterior chamber intraocular lens placement of the left eye   LENS:   Implant Name Type Inv. Item Serial No. Manufacturer Lot No. LRB No. Used  LENS IOL DIOP 24.0 - [id number] Intraocular Lens LENS IOL DIOP 24.0 [id number] AMO  Left 1       PCB00 +24.0   ULTRASOUND TIME: 1 minutes 27 seconds.  CDE 8.01   SURGEON:  Benay Pillow, MD, MPH   ANESTHESIA:  Topical with tetracaine drops augmented with 1% preservative-free intracameral lidocaine.  ESTIMATED BLOOD LOSS: <1 mL   COMPLICATIONS:  None.   DESCRIPTION OF PROCEDURE:  The patient was identified in the holding room and transported to the operating room and placed in the supine position under the operating microscope.  The left eye was identified as the operative eye and it was prepped and draped in the usual sterile ophthalmic fashion.   A 1.0 millimeter clear-corneal paracentesis was made at the 5:00 position. 0.5 ml of preservative-free 1% lidocaine with epinephrine was injected into the anterior chamber.  The anterior chamber was filled with Healon 5 viscoelastic.  A 2.4 millimeter keratome was used to make a near-clear corneal incision at the 2:00 position.  A curvilinear capsulorrhexis was made with a cystotome and capsulorrhexis forceps.  Balanced salt solution was used to hydrodissect and hydrodelineate the nucleus.   Phacoemulsification was then used in stop and chop fashion to remove the lens nucleus and epinucleus.  The remaining cortex was then removed using the irrigation and aspiration handpiece. Healon was then placed into the capsular bag to distend it for lens placement.  A lens was then injected into the capsular bag.  The remaining viscoelastic was aspirated.   Wounds were  hydrated with balanced salt solution.  The anterior chamber was inflated to a physiologic pressure with balanced salt solution.  Intracameral vigamox 0.1 mL undiltued was injected into the eye and a drop placed onto the ocular surface.  No wound leaks were noted.  The patient was taken to the recovery room in stable condition without complications of anesthesia or surgery  Benay Pillow 12/03/2017, 10:04 AM

## 2017-12-03 NOTE — Anesthesia Postprocedure Evaluation (Signed)
Anesthesia Post Note  Patient: Tiffany Hawkins  Procedure(s) Performed: CATARACT EXTRACTION PHACO AND INTRAOCULAR LENS PLACEMENT (IOC) LEFT  DIABETIC (Left Eye)  Patient location during evaluation: PACU Anesthesia Type: MAC Level of consciousness: awake and alert Pain management: pain level controlled Vital Signs Assessment: post-procedure vital signs reviewed and stable Respiratory status: spontaneous breathing, nonlabored ventilation, respiratory function stable and patient connected to nasal cannula oxygen Cardiovascular status: stable and blood pressure returned to baseline Postop Assessment: no apparent nausea or vomiting Anesthetic complications: no    Trecia Rogers

## 2017-12-03 NOTE — Transfer of Care (Signed)
Immediate Anesthesia Transfer of Care Note  Patient: Tiffany Hawkins  Procedure(s) Performed: CATARACT EXTRACTION PHACO AND INTRAOCULAR LENS PLACEMENT (IOC) LEFT  DIABETIC (Left Eye)  Patient Location: PACU  Anesthesia Type: MAC  Level of Consciousness: awake, alert  and patient cooperative  Airway and Oxygen Therapy: Patient Spontanous Breathing and Patient connected to supplemental oxygen  Post-op Assessment: Post-op Vital signs reviewed, Patient's Cardiovascular Status Stable, Respiratory Function Stable, Patent Airway and No signs of Nausea or vomiting  Post-op Vital Signs: Reviewed and stable  Complications: No apparent anesthesia complications

## 2017-12-03 NOTE — H&P (Signed)
The History and Physical notes are on paper, have been signed, and are to be scanned.   I have examined the patient and there are no changes to the H&P.   Benay Pillow 12/03/2017 9:31 AM

## 2017-12-03 NOTE — Anesthesia Procedure Notes (Signed)
Procedure Name: MAC Date/Time: 12/03/2017 9:43 AM Performed by: Lind Guest, CRNA Pre-anesthesia Checklist: Patient identified, Emergency Drugs available, Suction available, Patient being monitored and Timeout performed Patient Re-evaluated:Patient Re-evaluated prior to induction Oxygen Delivery Method: Nasal cannula

## 2017-12-04 ENCOUNTER — Encounter: Payer: Self-pay | Admitting: Ophthalmology

## 2018-01-01 DIAGNOSIS — H2511 Age-related nuclear cataract, right eye: Secondary | ICD-10-CM | POA: Diagnosis not present

## 2018-01-07 ENCOUNTER — Other Ambulatory Visit: Payer: Self-pay

## 2018-01-07 ENCOUNTER — Encounter: Payer: Self-pay | Admitting: *Deleted

## 2018-01-11 ENCOUNTER — Inpatient Hospital Stay: Payer: Medicare HMO | Attending: Oncology

## 2018-01-11 NOTE — Discharge Instructions (Signed)

## 2018-01-14 ENCOUNTER — Encounter
Admission: RE | Disposition: A | Discharge status: Home / Self Care | Payer: Self-pay | Source: Ambulatory Visit | Attending: Ophthalmology

## 2018-01-14 ENCOUNTER — Ambulatory Visit
Admission: RE | Admit: 2018-01-14 | Discharge: 2018-01-14 | Disposition: A | Discharge status: Home / Self Care | Payer: Medicare HMO | Source: Ambulatory Visit | Attending: Ophthalmology | Admitting: Ophthalmology

## 2018-01-14 ENCOUNTER — Ambulatory Visit: Payer: Medicare HMO | Admitting: Anesthesiology

## 2018-01-14 ENCOUNTER — Other Ambulatory Visit: Payer: Self-pay

## 2018-01-14 DIAGNOSIS — E1136 Type 2 diabetes mellitus with diabetic cataract: Secondary | ICD-10-CM | POA: Diagnosis not present

## 2018-01-14 DIAGNOSIS — E7800 Pure hypercholesterolemia, unspecified: Secondary | ICD-10-CM | POA: Insufficient documentation

## 2018-01-14 DIAGNOSIS — E1165 Type 2 diabetes mellitus with hyperglycemia: Secondary | ICD-10-CM

## 2018-01-14 DIAGNOSIS — F419 Anxiety disorder, unspecified: Secondary | ICD-10-CM | POA: Insufficient documentation

## 2018-01-14 DIAGNOSIS — G4730 Sleep apnea, unspecified: Secondary | ICD-10-CM | POA: Insufficient documentation

## 2018-01-14 DIAGNOSIS — I10 Essential (primary) hypertension: Secondary | ICD-10-CM | POA: Diagnosis not present

## 2018-01-14 DIAGNOSIS — Z87891 Personal history of nicotine dependence: Secondary | ICD-10-CM | POA: Insufficient documentation

## 2018-01-14 DIAGNOSIS — Z9221 Personal history of antineoplastic chemotherapy: Secondary | ICD-10-CM | POA: Diagnosis not present

## 2018-01-14 DIAGNOSIS — H2511 Age-related nuclear cataract, right eye: Secondary | ICD-10-CM | POA: Diagnosis not present

## 2018-01-14 DIAGNOSIS — Z794 Long term (current) use of insulin: Secondary | ICD-10-CM | POA: Diagnosis not present

## 2018-01-14 DIAGNOSIS — Z79899 Other long term (current) drug therapy: Secondary | ICD-10-CM | POA: Diagnosis not present

## 2018-01-14 DIAGNOSIS — Z85038 Personal history of other malignant neoplasm of large intestine: Secondary | ICD-10-CM | POA: Insufficient documentation

## 2018-01-14 DIAGNOSIS — H25811 Combined forms of age-related cataract, right eye: Secondary | ICD-10-CM | POA: Diagnosis not present

## 2018-01-14 HISTORY — PX: CATARACT EXTRACTION W/PHACO: SHX586

## 2018-01-14 LAB — GLUCOSE, CAPILLARY
GLUCOSE-CAPILLARY: 198 mg/dL — AB (ref 70–99)
Glucose-Capillary: 186 mg/dL — ABNORMAL HIGH (ref 70–99)

## 2018-01-14 SURGERY — PHACOEMULSIFICATION, CATARACT, WITH IOL INSERTION
Anesthesia: Monitor Anesthesia Care | Site: Eye | Laterality: Right

## 2018-01-14 MED ORDER — MIDAZOLAM HCL 2 MG/2ML IJ SOLN
INTRAMUSCULAR | Status: DC | PRN
  Administered 2018-01-14: 2 mg via INTRAVENOUS

## 2018-01-14 MED ORDER — ACETAMINOPHEN 325 MG PO TABS: 325.0000 mg | ORAL_TABLET | ORAL | Status: DC | PRN

## 2018-01-14 MED ORDER — SEMAGLUTIDE(0.25 OR 0.5MG/DOS) 2 MG/1.5ML ~~LOC~~ SOPN: 0.5000 mg | PEN_INJECTOR | SUBCUTANEOUS | 3 refills | Status: DC

## 2018-01-14 MED ORDER — TETRACAINE HCL 0.5 % OP SOLN
1.0000 [drp] | OPHTHALMIC | Status: DC | PRN
  Administered 2018-01-14 (×2): 1 [drp] via OPHTHALMIC

## 2018-01-14 MED ORDER — ACETAMINOPHEN 160 MG/5ML PO SOLN: 325.0000 mg | ORAL | Status: DC | PRN

## 2018-01-14 MED ORDER — SODIUM HYALURONATE 23 MG/ML IO SOLN
INTRAOCULAR | Status: DC | PRN
  Administered 2018-01-14: 0.6 mL via INTRAOCULAR

## 2018-01-14 MED ORDER — MOXIFLOXACIN HCL 0.5 % OP SOLN
OPHTHALMIC | Status: DC | PRN
  Administered 2018-01-14: 0.2 mL via OPHTHALMIC

## 2018-01-14 MED ORDER — ONDANSETRON HCL 4 MG/2ML IJ SOLN: 4.0000 mg | Freq: Once | INTRAMUSCULAR | Status: DC | PRN

## 2018-01-14 MED ORDER — EPINEPHRINE PF 1 MG/ML IJ SOLN
INTRAOCULAR | Status: DC | PRN
  Administered 2018-01-14: 78 mL via OPHTHALMIC

## 2018-01-14 MED ORDER — SODIUM HYALURONATE 10 MG/ML IO SOLN
INTRAOCULAR | Status: DC | PRN
  Administered 2018-01-14: 0.55 mL via INTRAOCULAR

## 2018-01-14 MED ORDER — LIDOCAINE HCL (PF) 2 % IJ SOLN
INTRAOCULAR | Status: DC | PRN
  Administered 2018-01-14: 1 mL via INTRAOCULAR

## 2018-01-14 MED ORDER — ARMC OPHTHALMIC DILATING DROPS
1.0000 "application " | OPHTHALMIC | Status: DC | PRN
  Administered 2018-01-14 (×3): 1 via OPHTHALMIC

## 2018-01-14 MED ORDER — FENTANYL CITRATE (PF) 100 MCG/2ML IJ SOLN
INTRAMUSCULAR | Status: DC | PRN
  Administered 2018-01-14: 50 ug via INTRAVENOUS

## 2018-01-14 SURGICAL SUPPLY — 17 items
CANNULA ANT/CHMB 27G (MISCELLANEOUS) ×1 IMPLANT
CANNULA ANT/CHMB 27GA (MISCELLANEOUS) ×3 IMPLANT
DISSECTOR HYDRO NUCLEUS 50X22 (MISCELLANEOUS) ×3 IMPLANT
GLOVE BIO SURGEON STRL SZ8 (GLOVE) ×3 IMPLANT
GLOVE SURG LX 7.5 STRW (GLOVE) ×2
GLOVE SURG LX STRL 7.5 STRW (GLOVE) ×1 IMPLANT
GOWN STRL REUS W/ TWL LRG LVL3 (GOWN DISPOSABLE) ×2 IMPLANT
GOWN STRL REUS W/TWL LRG LVL3 (GOWN DISPOSABLE) ×4
LENS IOL TECNIS ITEC 24.0 (Intraocular Lens) ×2 IMPLANT
MARKER SKIN DUAL TIP RULER LAB (MISCELLANEOUS) ×3 IMPLANT
PACK DR. KING ARMS (PACKS) ×3 IMPLANT
PACK EYE AFTER SURG (MISCELLANEOUS) ×3 IMPLANT
PACK OPTHALMIC (MISCELLANEOUS) ×3 IMPLANT
SYR 3ML LL SCALE MARK (SYRINGE) ×3 IMPLANT
SYR TB 1ML LUER SLIP (SYRINGE) ×3 IMPLANT
WATER STERILE IRR 500ML POUR (IV SOLUTION) ×3 IMPLANT
WIPE NON LINTING 3.25X3.25 (MISCELLANEOUS) ×3 IMPLANT

## 2018-01-14 NOTE — Anesthesia Preprocedure Evaluation (Signed)
Anesthesia Evaluation  Patient identified by MRN, date of birth, ID band Patient awake    Reviewed: Allergy & Precautions, H&P , NPO status , Patient's Chart, lab work & pertinent test results, reviewed documented beta blocker date and time   Airway Mallampati: II  TM Distance: >3 FB Neck ROM: full    Dental no notable dental hx.    Pulmonary sleep apnea , former smoker,    Pulmonary exam normal breath sounds clear to auscultation       Cardiovascular Exercise Tolerance: Good hypertension,  Rhythm:regular Rate:Normal     Neuro/Psych  Headaches, Anxiety    GI/Hepatic negative GI ROS, Neg liver ROS,   Endo/Other  diabetes, Type 2, Insulin Dependent  Renal/GU negative Renal ROS  negative genitourinary   Musculoskeletal   Abdominal   Peds  Hematology negative hematology ROS (+) Hx of colon CA, s/p chemo in 2016   Anesthesia Other Findings   Reproductive/Obstetrics negative OB ROS                             Anesthesia Physical  Anesthesia Plan  ASA: II  Anesthesia Plan: MAC   Post-op Pain Management:    Induction:   PONV Risk Score and Plan:   Airway Management Planned:   Additional Equipment:   Intra-op Plan:   Post-operative Plan:   Informed Consent: I have reviewed the patients History and Physical, chart, labs and discussed the procedure including the risks, benefits and alternatives for the proposed anesthesia with the patient or authorized representative who has indicated his/her understanding and acceptance.   Dental Advisory Given  Plan Discussed with: CRNA  Anesthesia Plan Comments:         Anesthesia Quick Evaluation

## 2018-01-14 NOTE — H&P (Signed)
The History and Physical notes are on paper, have been signed, and are to be scanned.   I have examined the patient and there are no changes to the H&P.   Benay Pillow 01/14/2018 7:15 AM

## 2018-01-14 NOTE — Anesthesia Postprocedure Evaluation (Signed)
Anesthesia Post Note  Patient: Tiffany Hawkins  Procedure(s) Performed: CATARACT EXTRACTION PHACO AND INTRAOCULAR LENS PLACEMENT (IOC) RIGHT DIABETIC (Right Eye)  Patient location during evaluation: PACU Anesthesia Type: MAC Level of consciousness: awake and alert Pain management: pain level controlled Vital Signs Assessment: post-procedure vital signs reviewed and stable Respiratory status: spontaneous breathing, nonlabored ventilation, respiratory function stable and patient connected to nasal cannula oxygen Cardiovascular status: stable and blood pressure returned to baseline Postop Assessment: no apparent nausea or vomiting Anesthetic complications: no    Alisa Graff

## 2018-01-14 NOTE — Transfer of Care (Signed)
Immediate Anesthesia Transfer of Care Note  Patient: Tiffany Hawkins  Procedure(s) Performed: CATARACT EXTRACTION PHACO AND INTRAOCULAR LENS PLACEMENT (IOC) RIGHT DIABETIC (Right Eye)  Patient Location: PACU  Anesthesia Type: MAC  Level of Consciousness: awake, alert  and patient cooperative  Airway and Oxygen Therapy: Patient Spontanous Breathing and Patient connected to supplemental oxygen  Post-op Assessment: Post-op Vital signs reviewed, Patient's Cardiovascular Status Stable, Respiratory Function Stable, Patent Airway and No signs of Nausea or vomiting  Post-op Vital Signs: Reviewed and stable  Complications: No apparent anesthesia complications

## 2018-01-14 NOTE — Op Note (Signed)
OPERATIVE NOTE  VEGAS COFFIN [id number] 01/14/2018   PREOPERATIVE DIAGNOSIS:  Nuclear sclerotic cataract right eye.  H25.11   POSTOPERATIVE DIAGNOSIS:    Nuclear sclerotic cataract right eye.     PROCEDURE:  Phacoemusification with posterior chamber intraocular lens placement of the right eye   LENS:   Implant Name Type Inv. Item Serial No. Manufacturer Lot No. LRB No. Used  LENS IOL DIOP 24.0 - [id number] Intraocular Lens LENS IOL DIOP 24.0 [id number] AMO  Right 1       PCB00 +24.0   ULTRASOUND TIME: 0 minutes 24 seconds.  CDE 2.56   SURGEON:  Benay Pillow, MD, MPH  ANESTHESIOLOGIST: Anesthesiologist: Alisa Graff, MD CRNA: Mayme Genta, CRNA   ANESTHESIA:  Topical with tetracaine drops augmented with 1% preservative-free intracameral lidocaine.  ESTIMATED BLOOD LOSS: less than 1 mL.   COMPLICATIONS:  None.   DESCRIPTION OF PROCEDURE:  The patient was identified in the holding room and transported to the operating room and placed in the supine position under the operating microscope.  The right eye was identified as the operative eye and it was prepped and draped in the usual sterile ophthalmic fashion.   A 1.0 millimeter clear-corneal paracentesis was made at the 10:30 position. 0.5 ml of preservative-free 1% lidocaine with epinephrine was injected into the anterior chamber.  The anterior chamber was filled with Healon 5 viscoelastic.  A 2.4 millimeter keratome was used to make a near-clear corneal incision at the 8:00 position.  A curvilinear capsulorrhexis was made with a cystotome and capsulorrhexis forceps.  Balanced salt solution was used to hydrodissect and hydrodelineate the nucleus.   Phacoemulsification was then used in stop and chop fashion to remove the lens nucleus and epinucleus.  The remaining cortex was then removed using the irrigation and aspiration handpiece. Healon was then placed into the capsular bag to distend it for lens placement.  A lens was  then injected into the capsular bag.  The remaining viscoelastic was aspirated.   Wounds were hydrated with balanced salt solution.  The anterior chamber was inflated to a physiologic pressure with balanced salt solution.   Intracameral vigamox 0.1 mL undiluted was injected into the eye and a drop placed onto the ocular surface.  No wound leaks were noted.  The patient was taken to the recovery room in stable condition without complications of anesthesia or surgery  Benay Pillow 01/14/2018, 7:51 AM

## 2018-01-14 NOTE — Anesthesia Procedure Notes (Signed)
Procedure Name: MAC Performed by: Delila Kuklinski, CRNA Pre-anesthesia Checklist: Patient identified, Emergency Drugs available, Suction available, Timeout performed and Patient being monitored Patient Re-evaluated:Patient Re-evaluated prior to induction Oxygen Delivery Method: Nasal cannula Placement Confirmation: positive ETCO2       

## 2018-01-17 DIAGNOSIS — C44729 Squamous cell carcinoma of skin of left lower limb, including hip: Secondary | ICD-10-CM | POA: Diagnosis not present

## 2018-01-17 DIAGNOSIS — D485 Neoplasm of uncertain behavior of skin: Secondary | ICD-10-CM | POA: Diagnosis not present

## 2018-02-07 ENCOUNTER — Encounter: Payer: Self-pay | Admitting: Nurse Practitioner

## 2018-02-07 ENCOUNTER — Ambulatory Visit (INDEPENDENT_AMBULATORY_CARE_PROVIDER_SITE_OTHER): Payer: Medicare HMO | Admitting: Nurse Practitioner

## 2018-02-07 VITALS — BP 152/87 | HR 85 | Resp 16 | Ht 62.0 in | Wt 148.0 lb

## 2018-02-07 DIAGNOSIS — C44709 Unspecified malignant neoplasm of skin of left lower limb, including hip: Secondary | ICD-10-CM

## 2018-02-07 DIAGNOSIS — E1165 Type 2 diabetes mellitus with hyperglycemia: Secondary | ICD-10-CM

## 2018-02-07 DIAGNOSIS — Z1239 Encounter for other screening for malignant neoplasm of breast: Secondary | ICD-10-CM | POA: Diagnosis not present

## 2018-02-07 DIAGNOSIS — I10 Essential (primary) hypertension: Secondary | ICD-10-CM

## 2018-02-07 LAB — POCT GLYCOSYLATED HEMOGLOBIN (HGB A1C): Hemoglobin A1C: 7.2 % — AB (ref 4.0–5.6)

## 2018-02-07 NOTE — Progress Notes (Signed)
[facility] [place], [place] [address]  Internal MEDICINE  Office Visit Note  Patient Name: Tiffany Hawkins  [id number]  [id number]  Date of Service: 02/10/2018  Chief Complaint  Patient presents with  . Medical Management of Chronic Issues    3 month follow up. pt has an appointment tomorrow for skin cancer  . Diabetes  . Hyperlipidemia  . Hypertension  . Quality Metric Gaps    flu vaccine    The patient has a skin cancer on the left lower leg. She is scheduled to have it removed tomorrow.   Diabetes  She presents for her follow-up diabetic visit. She has type 2 diabetes mellitus. Her disease course has been worsening. Hypoglycemia symptoms include dizziness, headaches and nervousness/anxiousness. There are no diabetic associated symptoms. Pertinent negatives for diabetes include no chest pain and no fatigue. There are no hypoglycemic complications. Symptoms are worsening. Risk factors for coronary artery disease include diabetes mellitus, dyslipidemia, hypertension and stress. Current diabetic treatment includes insulin injections (ozempic 0.5mg  weekly). She is compliant with treatment most of the time. Her weight is stable. She is following a generally healthy diet. Meal planning includes avoidance of concentrated sweets. She has not had a previous visit with a dietitian. She participates in exercise every other day. There is no change in her home blood glucose trend. An ACE inhibitor/angiotensin II receptor blocker is being taken. She does not see a podiatrist.Eye exam is current.       Current Medication: Outpatient Encounter Medications as of 02/07/2018  Medication Sig Note  . ALPRAZolam (XANAX) 0.25 MG tablet Take 0.25 mg by mouth 2 (two) times daily as needed for anxiety. 01/14/2018: PRN  . amLODipine (NORVASC) 5 MG tablet TAKE 1 TABLET EVERY DAY   . Blood Glucose Calibration (ACCU-CHEK SMARTVIEW CONTROL VI) by In Vitro route. Use as di   .  Celecoxib (CELEBREX PO) Take by mouth.   . citalopram (CELEXA) 20 MG tablet Take 2 tablets daily for depression   . fenofibrate 54 MG tablet Take 1 tablet (54 mg total) by mouth daily.   Marland Kitchen glucose blood (ACCU-CHEK SMARTVIEW) test strip Blood sugar checks TID   . ibuprofen (ADVIL,MOTRIN) 800 MG tablet Take 800 mg by mouth every 8 (eight) hours as needed.   . insulin aspart protamine - aspart (NOVOLOG MIX 70/30 FLEXPEN) (70-30) 100 UNIT/ML FlexPen Inject 0.15 mLs (15 Units total) into the skin 2 (two) times daily. 12/03/2017: 7.5 units   . lidocaine-prilocaine (EMLA) cream Apply 1 application topically as needed. 01/14/2018: prn  . lovastatin (MEVACOR) 20 MG tablet Take 20 mg by mouth at bedtime.    . Multiple Vitamin (MULTI-VITAMINS) TABS Take 1 tablet by mouth daily.    . ondansetron (ZOFRAN-ODT) 4 MG disintegrating tablet Take by mouth. 01/14/2018: PRN  . pantoprazole (PROTONIX) 20 MG tablet Take 40 mg by mouth daily.    . Semaglutide,0.25 or 0.5MG /DOS, (OZEMPIC, 0.25 OR 0.5 MG/DOSE,) 2 MG/1.5ML SOPN Inject 0.5 mg into the skin once a week.   . sharps container 1 each by Does not apply route as needed. To use for insulin syringes and needles. Taking insulin at least twice daily.  E11.65   . butalbital-acetaminophen-caffeine (FIORICET, ESGIC) 50-325-40 MG tablet Take 1 tablet by mouth every 6 (six) hours as needed for headache. (Patient not taking: Reported on 11/26/2017)    No facility-administered encounter medications on file as of 02/07/2018.     Surgical History: Past Surgical History:  Procedure Laterality Date  .  APPENDECTOMY    . BACK SURGERY    . CATARACT EXTRACTION W/PHACO Left 12/03/2017   Procedure: CATARACT EXTRACTION PHACO AND INTRAOCULAR LENS PLACEMENT ([place]) LEFT  DIABETIC;  Surgeon: Eulogio Bear, MD;  Location: [facility];  Service: Ophthalmology;  Laterality: Left;  Diabetic - insulin  . CATARACT EXTRACTION W/PHACO Right 01/14/2018   Procedure: CATARACT EXTRACTION  PHACO AND INTRAOCULAR LENS PLACEMENT ([place]) RIGHT DIABETIC;  Surgeon: Eulogio Bear, MD;  Location: [facility];  Service: Ophthalmology;  Laterality: Right;  Diabetic - insulin  . CERVICAL DISC SURGERY    . CHOLECYSTECTOMY    . COLON SURGERY    . COLONOSCOPY    . COLONOSCOPY WITH PROPOFOL N/A 05/17/2015   Procedure: COLONOSCOPY WITH PROPOFOL;  Surgeon: Manya Silvas, MD;  Location: [facility] ENDOSCOPY;  Service: Endoscopy;  Laterality: N/A;  . ESOPHAGOGASTRODUODENOSCOPY    . HERNIA REPAIR    . PORT A CATH INJECTION ([place] HX)    . TUBAL LIGATION      Medical History: Past Medical History:  Diagnosis Date  . Anxiety   . Colon cancer ([place]) 10/12/2014   had chemo  . Diabetes mellitus without complication ([place])   . Hyperlipidemia   . Hypertension   . Lateral epicondylitis   . Personal history of chemotherapy   . Polio    childhood  . Sleep apnea    no CPAP    Family History: Family History  Problem Relation Age of Onset  . Alzheimer's disease Mother   . Heart disease Father   . Colon cancer Sister   . Colon cancer Cousin   . Colon cancer Cousin     Social History   Socioeconomic History  . Marital status: Married    Spouse name: Not on file  . Number of children: Not on file  . Years of education: Not on file  . Highest education level: Not on file  Occupational History  . Not on file  Social Needs  . Financial resource strain: Not on file  . Food insecurity:    Worry: Not on file    Inability: Not on file  . Transportation needs:    Medical: Not on file    Non-medical: Not on file  Tobacco Use  . Smoking status: Former Smoker    Packs/day: 1.00    Types: Cigarettes    Last attempt to quit: 04/17/1981    Years since quitting: 36.8  . Smokeless tobacco: Never Used  Substance and Sexual Activity  . Alcohol use: No  . Drug use: No  . Sexual activity: Never  Lifestyle  . Physical activity:    Days per week: Not on file    Minutes per session: Not  on file  . Stress: Not on file  Relationships  . Social connections:    Talks on phone: Not on file    Gets together: Not on file    Attends religious service: Not on file    Active member of club or organization: Not on file    Attends meetings of clubs or organizations: Not on file    Relationship status: Not on file  . Intimate partner violence:    Fear of current or ex partner: Not on file    Emotionally abused: Not on file    Physically abused: Not on file    Forced sexual activity: Not on file  Other Topics Concern  . Not on file  Social History Narrative  . Not on file  Review of Systems  Constitutional: Negative for activity change, chills, fatigue and fever.  HENT: Negative for congestion, ear pain, postnasal drip, rhinorrhea, sinus pain, sore throat and voice change.   Eyes: Negative for photophobia, pain, discharge, redness, itching and visual disturbance.       Bilateral cataract removal since her last visit  Respiratory: Negative for cough, chest tightness and wheezing.   Cardiovascular: Negative for chest pain and palpitations.  Gastrointestinal: Positive for nausea. Negative for vomiting.  Endocrine:       Blood sugars doing well   Genitourinary: Negative.  Negative for frequency, hematuria and urgency.  Musculoskeletal: Positive for myalgias. Negative for arthralgias and back pain.  Skin: Negative for rash.       Single, reddened lesion on the left left leg.   Allergic/Immunologic: Negative for environmental allergies.  Neurological: Positive for dizziness and headaches.  Hematological: Positive for adenopathy.  Psychiatric/Behavioral: Positive for dysphoric mood. The patient is nervous/anxious.     Today's Vitals   02/07/18 1059  BP: (!) 152/87  Pulse: 85  Resp: 16  SpO2: 95%  Weight: 148 lb (67.1 kg)  Height: 5\' 2"  (1.575 m)    Physical Exam  Constitutional: She is oriented to person, place, and time. She appears well-developed and  well-nourished. No distress.  HENT:  Head: Normocephalic and atraumatic.  Nose: Nose normal. Right sinus exhibits no maxillary sinus tenderness and no frontal sinus tenderness. Left sinus exhibits no maxillary sinus tenderness and no frontal sinus tenderness.  Mouth/Throat: Oropharynx is clear and moist. No oropharyngeal exudate.  Eyes: Pupils are equal, round, and reactive to light. Conjunctivae and EOM are normal.  Neck: Normal range of motion. Neck supple. No JVD present. No tracheal deviation present. No thyromegaly present.  Cardiovascular: Normal rate, regular rhythm and normal heart sounds. Exam reveals no gallop and no friction rub.  No murmur heard. Pulmonary/Chest: Effort normal and breath sounds normal. No respiratory distress. She has no wheezes. She has no rales. She exhibits no tenderness.  Abdominal: Soft. Bowel sounds are normal. There is no tenderness.  Musculoskeletal: Normal range of motion.  Lymphadenopathy:    She has no cervical adenopathy.  Neurological: She is alert and oriented to person, place, and time. She has normal strength. She is not disoriented. She displays normal reflexes. No cranial nerve deficit. She displays a negative Romberg sign. Gait abnormal.  Accessory muscles mildly weak, bilaterally.   Skin: Skin is warm and dry. Capillary refill takes 2 to 3 seconds. She is not diaphoretic.     Psychiatric: Her behavior is normal. Judgment and thought content normal. Her mood appears anxious.  Nursing note and vitals reviewed.  Assessment/Plan:  1. Uncontrolled type 2 diabetes mellitus with hyperglycemia (HCC) - POCT HgB A1C 7.2 today. Overall, well controlled. Continue diabetic medication as prescribed.   2. Essential hypertension Stable. Continue bp medication as prescribed  3. 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 Scheduled to have removal of affected area next week per dermatology.  4. Screening for breast cancer - MM DIGITAL SCREENING  BILATERAL; Future  General Counseling: synda bagent understanding of the findings of todays visit and agrees with plan of treatment. I have discussed any further diagnostic evaluation that may be needed or ordered today. We also reviewed her medications today. she has been encouraged to call the office with any questions or concerns that should arise related to todays visit.  Diabetes Counseling:  1. Addition of ACE inh/ ARB'S for nephroprotection. Microalbumin is updated  2. Diabetic foot care, prevention of complications. Podiatry consult 3. Exercise and lose weight.  4. Diabetic eye examination, Diabetic eye exam is updated  5. Monitor blood sugar closlely. nutrition counseling.  6. Sign and symptoms of hypoglycemia including shaking sweating,confusion and headaches.  This patient was seen by Leretha Pol FNP Collaboration with Dr Lavera Guise as a part of collaborative care agreement  Orders Placed This Encounter  Procedures  . MM DIGITAL SCREENING BILATERAL  . POCT HgB A1C      Time spent: 25 Minutes      Dr Lavera Guise Internal medicine

## 2018-02-08 DIAGNOSIS — C44729 Squamous cell carcinoma of skin of left lower limb, including hip: Secondary | ICD-10-CM | POA: Diagnosis not present

## 2018-02-10 DIAGNOSIS — C44709 Unspecified malignant neoplasm of skin of left lower limb, including hip: Secondary | ICD-10-CM | POA: Insufficient documentation

## 2018-02-10 DIAGNOSIS — Z0001 Encounter for general adult medical examination with abnormal findings: Secondary | ICD-10-CM | POA: Insufficient documentation

## 2018-02-19 ENCOUNTER — Ambulatory Visit
Admission: RE | Admit: 2018-02-19 | Discharge: 2018-02-19 | Disposition: A | Discharge status: Home / Self Care | Payer: Medicare HMO | Source: Ambulatory Visit | Attending: Nurse Practitioner | Admitting: Nurse Practitioner

## 2018-02-19 ENCOUNTER — Other Ambulatory Visit: Payer: Self-pay | Admitting: Internal Medicine

## 2018-02-19 DIAGNOSIS — Z1231 Encounter for screening mammogram for malignant neoplasm of breast: Secondary | ICD-10-CM | POA: Diagnosis not present

## 2018-02-19 DIAGNOSIS — Z1239 Encounter for other screening for malignant neoplasm of breast: Secondary | ICD-10-CM

## 2018-02-21 ENCOUNTER — Ambulatory Visit: Payer: Self-pay | Admitting: Nurse Practitioner

## 2018-02-22 ENCOUNTER — Inpatient Hospital Stay: Payer: Medicare HMO

## 2018-02-22 ENCOUNTER — Encounter: Payer: Self-pay | Admitting: Nurse Practitioner

## 2018-02-22 ENCOUNTER — Ambulatory Visit (INDEPENDENT_AMBULATORY_CARE_PROVIDER_SITE_OTHER): Payer: Medicare HMO | Admitting: Nurse Practitioner

## 2018-02-22 VITALS — BP 153/81 | HR 93 | Resp 16 | Ht 62.0 in | Wt 149.6 lb

## 2018-02-22 DIAGNOSIS — Z0001 Encounter for general adult medical examination with abnormal findings: Secondary | ICD-10-CM | POA: Diagnosis not present

## 2018-02-22 DIAGNOSIS — C44709 Unspecified malignant neoplasm of skin of left lower limb, including hip: Secondary | ICD-10-CM | POA: Diagnosis not present

## 2018-02-22 DIAGNOSIS — R300 Dysuria: Secondary | ICD-10-CM

## 2018-02-22 DIAGNOSIS — E1165 Type 2 diabetes mellitus with hyperglycemia: Secondary | ICD-10-CM

## 2018-02-22 DIAGNOSIS — I10 Essential (primary) hypertension: Secondary | ICD-10-CM | POA: Diagnosis not present

## 2018-02-22 NOTE — Progress Notes (Signed)
[facility] [place], [place] [address]  Internal MEDICINE  Office Visit Note  Patient Name: Tiffany Hawkins  [id number]  [id number]  Date of Service: 02/23/2018  Chief Complaint  Patient presents with  . Medicare Wellness    well visit  . Hypertension  . Hyperlipidemia  . Diabetes     Patient arrives for annual medicare visit. Patient recently had cancer lesion removed from her left lower leg. Patient finished her antibiotic course 2 days ago. Patient reports increased number of loose stools per day. Patient also had two episodes of nausea with vomiting within these two weeks. Patient feels a little better for the past 2 days since she stopped antibiotics. Patient is educated to increase her fluid intake and return back to the clinic in one week if the problem persists. Otherwise, patient feels well. Patient denies chest pain, shortness of breath, sudden weight change, and vision disturbance. Patient had recent cataract removal surgery and doing well with it too. Patient does not have any other concerns or questions.   Pt is here for routine health maintenance examination  Current Medication: Outpatient Encounter Medications as of 02/22/2018  Medication Sig Note  . ALPRAZolam (XANAX) 0.25 MG tablet Take 0.25 mg by mouth 2 (two) times daily as needed for anxiety. 01/14/2018: PRN  . amLODipine (NORVASC) 5 MG tablet TAKE 1 TABLET EVERY DAY   . Blood Glucose Calibration (ACCU-CHEK SMARTVIEW CONTROL VI) by In Vitro route. Use as di   . Celecoxib (CELEBREX PO) Take by mouth.   . citalopram (CELEXA) 20 MG tablet Take 2 tablets daily for depression   . fenofibrate 54 MG tablet Take 1 tablet (54 mg total) by mouth daily.   Marland Kitchen glucose blood (ACCU-CHEK SMARTVIEW) test strip Blood sugar checks TID   . ibuprofen (ADVIL,MOTRIN) 800 MG tablet Take 800 mg by mouth every 8 (eight) hours as needed.   . insulin aspart protamine - aspart (NOVOLOG MIX 70/30 FLEXPEN) (70-30) 100  UNIT/ML FlexPen Inject 0.15 mLs (15 Units total) into the skin 2 (two) times daily. 12/03/2017: 7.5 units   . lidocaine-prilocaine (EMLA) cream Apply 1 application topically as needed. 01/14/2018: prn  . lovastatin (MEVACOR) 20 MG tablet TAKE 1 TABLET AT BEDTIME FOR HIGH CHOLESTEROL   . Multiple Vitamin (MULTI-VITAMINS) TABS Take 1 tablet by mouth daily.    . ondansetron (ZOFRAN-ODT) 4 MG disintegrating tablet Take by mouth. 01/14/2018: PRN  . pantoprazole (PROTONIX) 20 MG tablet Take 40 mg by mouth daily.    . Semaglutide,0.25 or 0.5MG /DOS, (OZEMPIC, 0.25 OR 0.5 MG/DOSE,) 2 MG/1.5ML SOPN Inject 0.5 mg into the skin once a week.   . sharps container 1 each by Does not apply route as needed. To use for insulin syringes and needles. Taking insulin at least twice daily.  E11.65   . butalbital-acetaminophen-caffeine (FIORICET, ESGIC) 50-325-40 MG tablet Take 1 tablet by mouth every 6 (six) hours as needed for headache. (Patient not taking: Reported on 11/26/2017)    No facility-administered encounter medications on file as of 02/22/2018.     Surgical History: Past Surgical History:  Procedure Laterality Date  . APPENDECTOMY    . BACK SURGERY    . CATARACT EXTRACTION W/PHACO Left 12/03/2017   Procedure: CATARACT EXTRACTION PHACO AND INTRAOCULAR LENS PLACEMENT ([place]) LEFT  DIABETIC;  Surgeon: Eulogio Bear, MD;  Location: [facility];  Service: Ophthalmology;  Laterality: Left;  Diabetic - insulin  . CATARACT EXTRACTION W/PHACO Right 01/14/2018   Procedure: CATARACT EXTRACTION  PHACO AND INTRAOCULAR LENS PLACEMENT ([facility]) RIGHT DIABETIC;  Surgeon: Eulogio Bear, MD;  Location: [facility];  Service: Ophthalmology;  Laterality: Right;  Diabetic - insulin  . CERVICAL DISC SURGERY    . CHOLECYSTECTOMY    . COLON SURGERY    . COLONOSCOPY    . COLONOSCOPY WITH PROPOFOL N/A 05/17/2015   Procedure: COLONOSCOPY WITH PROPOFOL;  Surgeon: Manya Silvas, MD;  Location: [facility] ENDOSCOPY;   Service: Endoscopy;  Laterality: N/A;  . ESOPHAGOGASTRODUODENOSCOPY    . HERNIA REPAIR    . PORT A CATH INJECTION ([place] HX)    . TUBAL LIGATION      Medical History: Past Medical History:  Diagnosis Date  . Anxiety   . Colon cancer ([place]) 10/12/2014   had chemo  . Diabetes mellitus without complication ([place])   . Hyperlipidemia   . Hypertension   . Lateral epicondylitis   . Personal history of chemotherapy   . Polio    childhood  . Sleep apnea    no CPAP    Family History: Family History  Problem Relation Age of Onset  . Alzheimer's disease Mother   . Heart disease Father   . Colon cancer Sister   . Colon cancer Cousin   . Colon cancer Cousin   . Breast cancer Neg Hx       Review of Systems  Constitutional: Negative for activity change, appetite change, chills, diaphoresis, fatigue, fever and unexpected weight change.  HENT: Negative for congestion, ear discharge, ear pain, facial swelling, hearing loss, rhinorrhea, sinus pressure, sinus pain, sore throat, trouble swallowing and voice change.   Eyes: Negative for pain, discharge, redness, itching and visual disturbance.  Respiratory: Negative for apnea, cough, choking, chest tightness, wheezing and stridor.   Cardiovascular: Negative for chest pain, palpitations and leg swelling.  Gastrointestinal: Positive for diarrhea and nausea. Negative for abdominal distention, abdominal pain and blood in stool.  Endocrine: Negative for cold intolerance, heat intolerance, polydipsia, polyphagia and polyuria.       Blood sugars doing well   Genitourinary: Negative for difficulty urinating, dysuria, flank pain, frequency, hematuria, pelvic pain and urgency.  Musculoskeletal: Negative for arthralgias and back pain.  Skin: Positive for wound. Negative for color change, pallor and rash.       Stiches removed formed left lower leg, site looks good without any signs of infection  Allergic/Immunologic: Negative for environmental allergies.   Neurological: Positive for dizziness. Negative for tremors, seizures, facial asymmetry, speech difficulty, light-headedness, numbness and headaches.  Hematological: Negative for adenopathy.  Psychiatric/Behavioral: Negative for agitation, behavioral problems, confusion and decreased concentration. The patient is nervous/anxious.     Today's Vitals   02/22/18 1146  BP: (!) 153/81  Pulse: 93  Resp: 16  SpO2: 94%  Weight: 149 lb 9.6 oz (67.9 kg)  Height: 5\' 2"  (1.575 m)    Physical Exam  Constitutional: She is oriented to person, place, and time. She appears well-developed and well-nourished.  HENT:  Head: Normocephalic and atraumatic.  Right Ear: External ear normal.  Left Ear: External ear normal.  Nose: Nose normal.  Mouth/Throat: Oropharynx is clear and moist. No oropharyngeal exudate.  Eyes: Pupils are equal, round, and reactive to light. Conjunctivae and EOM are normal. Right eye exhibits no discharge. Left eye exhibits no discharge. No scleral icterus.  Neck: Normal range of motion. Neck supple. No JVD present. Carotid bruit is not present. No tracheal deviation present. No thyromegaly present.  Cardiovascular: Normal rate, regular rhythm, normal heart  sounds and intact distal pulses. Exam reveals no gallop and no friction rub.  No murmur heard. Pulmonary/Chest: Effort normal and breath sounds normal. No stridor. No respiratory distress. She has no wheezes. She has no rales. She exhibits no tenderness.  Abdominal: Soft. Bowel sounds are normal. She exhibits no distension and no mass. There is no tenderness. There is no rebound and no guarding.  Musculoskeletal: Normal range of motion. She exhibits no edema or deformity.  Neurological: She is alert and oriented to person, place, and time. No cranial nerve deficit or sensory deficit. Coordination abnormal.  The patient is at her neurological baseline.   Skin: Skin is warm and dry. She is not diaphoretic.  Psychiatric: Her speech  is normal and behavior is normal. Judgment and thought content normal. Her mood appears anxious. Cognition and memory are normal.  Nursing note and vitals reviewed.   Depression screen [facility] 2/9 02/22/2018 02/07/2018 11/06/2017 08/24/2017 08/02/2017  Decreased Interest 0 0 0 0 0  Down, Depressed, Hopeless 0 0 0 0 0  PHQ - 2 Score 0 0 0 0 0    Functional Status Survey: Is the patient deaf or have difficulty hearing?: No Does the patient have difficulty seeing, even when wearing glasses/contacts?: No(had cataract surgery go back 03/08/18, making adjustments) Does the patient have difficulty concentrating, remembering, or making decisions?: Yes Does the patient have difficulty walking or climbing stairs?: Yes(pt is off balance) Does the patient have difficulty dressing or bathing?: No Does the patient have difficulty doing errands alone such as visiting a doctor's office or shopping?: No  MMSE - [place] Exam 02/22/2018  Orientation to time 5  Orientation to Place 5  Registration 3  Attention/ Calculation 5  Recall 3  Language- name 2 objects 2  Language- repeat 1  Language- follow 3 step command 3  Language- read & follow direction 1  Write a sentence 1  Copy design 1  Total score 30    Fall Risk  02/22/2018 02/07/2018 11/06/2017 08/24/2017 08/02/2017  Falls in the past year? 0 No No No No    LABS: Recent Results (from the past 2160 hour(s))  Glucose, capillary     Status: Abnormal   Collection Time: 12/03/17  8:27 AM  Result Value Ref Range   Glucose-Capillary 178 (H) 70 - 99 mg/dL  Glucose, capillary     Status: Abnormal   Collection Time: 12/03/17 10:08 AM  Result Value Ref Range   Glucose-Capillary 159 (H) 70 - 99 mg/dL  Glucose, capillary     Status: Abnormal   Collection Time: 01/14/18  6:43 AM  Result Value Ref Range   Glucose-Capillary 198 (H) 70 - 99 mg/dL  Glucose, capillary     Status: Abnormal   Collection Time: 01/14/18  7:57 AM  Result Value Ref Range    Glucose-Capillary 186 (H) 70 - 99 mg/dL  POCT HgB A1C     Status: Abnormal   Collection Time: 02/07/18 11:12 AM  Result Value Ref Range   Hemoglobin A1C 7.2 (A) 4.0 - 5.6 %   HbA1c POC (<> result, manual entry)     HbA1c, POC (prediabetic range)     HbA1c, POC (controlled diabetic range)      Assessment/Plan: 1. Encounter for general adult medical examination with abnormal findings Annual health maintenance exam today  2. Uncontrolled type 2 diabetes mellitus with hyperglycemia (HCC) Last check HgbA1c done 02/07/2018 and was 7.2. Continue diabetic medication as prescribed. Sample of ozempic was provided today  3. Essential hypertension Blood pressure stable. Continue bp medication as prescribed   4. 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 Stitches removed from site about 2 weeks ago and site looks good without evidence of inflammation or infection. Advised her to follow up with dermatology as scheduled.   5. Dysuria Routine u/a obtained today.  General Counseling: sue fernicola understanding of the findings of todays visit and agrees with plan of treatment. I have discussed any further diagnostic evaluation that may be needed or ordered today. We also reviewed her medications today. she has been encouraged to call the office with any questions or concerns that should arise related to todays visit.    Counseling: Patient is educated about increased physical activities and healthy nutrition choice. Patient encouraged to eliminated any sugar and limit her carbohydrates intake. Patient verbalizes understanding.  Diabetes Counseling:  1. Addition of ACE inh/ ARB'S for nephroprotection. Microalbumin is updated  2. Diabetic foot care, prevention of complications. Podiatry consult 3. Exercise and lose weight.  4. Diabetic eye examination, Diabetic eye exam is updated  5. Monitor blood sugar closlely. nutrition counseling.  6. Sign and symptoms of  hypoglycemia including shaking sweating,confusion and headaches.  This patient was seen by Leretha Pol FNP Collaboration with Dr Lavera Guise as a part of collaborative care agreement  Time spent: [place], MD  Internal Medicine

## 2018-02-23 DIAGNOSIS — R300 Dysuria: Secondary | ICD-10-CM | POA: Insufficient documentation

## 2018-02-24 NOTE — Progress Notes (Signed)
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  INTERVAL HISTORY: Patient returns to clinic today for routine six-month evaluation.  She currently feels well and is asymptomatic.  She has no neurologic complaints.  She has a good appetite and denies weight loss. She denies any chest pain or shortness of breath. She denies any nausea, vomiting, constipation, or diarrhea. She has no abdominal pain and is noted no changes in her bowel movements.  She denies any melena or hematochezia.  She has no urinary complaints.  Patient feels at her baseline offers no specific complaints today.  REVIEW OF SYSTEMS:   Review of Systems  Constitutional: Negative.  Negative for fever, malaise/fatigue and weight loss.  Respiratory: Negative.  Negative for cough and shortness of breath.   Cardiovascular: Negative.  Negative for chest pain and leg swelling.  Gastrointestinal: Negative for abdominal pain, blood in stool, constipation, diarrhea, melena, nausea and vomiting.  Genitourinary: Negative.  Negative for dysuria.  Musculoskeletal: Negative.  Negative for back pain.  Neurological: Negative.  Negative for dizziness, sensory change, focal weakness, weakness and headaches.  Psychiatric/Behavioral: Negative.  The patient is not nervous/anxious.     As per HPI. Otherwise, a complete review of systems is negative.  PAST MEDICAL HISTORY: Past Medical History:  Diagnosis Date  . Anxiety   . Colon cancer ([place]) 10/12/2014   had chemo  . Diabetes mellitus without complication ([place])   . Hyperlipidemia   . Hypertension   . Lateral  epicondylitis   . Personal history of chemotherapy   . Polio    childhood  . Sleep apnea    no CPAP    PAST SURGICAL HISTORY: Past Surgical History:  Procedure Laterality Date  . APPENDECTOMY    . BACK SURGERY    . CATARACT EXTRACTION W/PHACO Left 12/03/2017   Procedure: CATARACT EXTRACTION PHACO AND INTRAOCULAR LENS PLACEMENT ([place]) LEFT  DIABETIC;  Surgeon: Eulogio Bear, MD;  Location: [facility];  Service: Ophthalmology;  Laterality: Left;  Diabetic - insulin  . CATARACT EXTRACTION W/PHACO Right 01/14/2018   Procedure: CATARACT EXTRACTION PHACO AND INTRAOCULAR LENS PLACEMENT ([place]) RIGHT DIABETIC;  Surgeon: Eulogio Bear, MD;  Location: [place];  Service: Ophthalmology;  Laterality: Right;  Diabetic - insulin  . CERVICAL DISC SURGERY    . CHOLECYSTECTOMY    . COLON SURGERY    . COLONOSCOPY    . COLONOSCOPY WITH PROPOFOL N/A 05/17/2015   Procedure: COLONOSCOPY WITH PROPOFOL;  Surgeon: Manya Silvas, MD;  [facility] ENDOSCOPY;  Service: Endoscopy;  Laterality: N/A;  . ESOPHAGOGASTRODUODENOSCOPY    . HERNIA REPAIR    . PORT A CATH INJECTION ([place] HX)    . TUBAL LIGATION      FAMILY HISTORY Family History  Problem Relation Age of Onset  . Alzheimer's disease Mother   . Heart disease Father   . Colon cancer Sister   . Colon cancer Cousin   . Colon cancer Cousin   . Breast cancer Neg Hx        ADVANCED DIRECTIVES:    HEALTH MAINTENANCE: Social History   Tobacco Use  . Smoking status: Former Smoker    Packs/day: 1.00  Types: Cigarettes    Last attempt to quit: 04/17/1981    Years since quitting: 36.8  . Smokeless tobacco: Never Used  Substance Use Topics  . Alcohol use: No  . Drug use: No    No Known Allergies  Current Outpatient Medications  Medication Sig Dispense Refill  . ALPRAZolam (XANAX) 0.25 MG tablet Take 0.25 mg by mouth 2 (two) times daily as needed for anxiety.    Marland Kitchen amLODipine (NORVASC) 5 MG tablet TAKE 1 TABLET  EVERY DAY 90 tablet 3  . Blood Glucose Calibration (ACCU-CHEK SMARTVIEW CONTROL VI) by In Vitro route. Use as di    . butalbital-acetaminophen-caffeine (FIORICET, ESGIC) 50-325-40 MG tablet Take 1 tablet by mouth every 6 (six) hours as needed for headache. 30 tablet 1  . Celecoxib (CELEBREX PO) Take by mouth.    . citalopram (CELEXA) 20 MG tablet Take 2 tablets daily for depression 180 tablet 1  . fenofibrate 54 MG tablet Take 1 tablet (54 mg total) by mouth daily. 90 tablet 4  . glucose blood (ACCU-CHEK SMARTVIEW) test strip Blood sugar checks TID 100 each 5  . ibuprofen (ADVIL,MOTRIN) 800 MG tablet Take 800 mg by mouth every 8 (eight) hours as needed.    . insulin aspart protamine - aspart (NOVOLOG MIX 70/30 FLEXPEN) (70-30) 100 UNIT/ML FlexPen Inject 0.15 mLs (15 Units total) into the skin 2 (two) times daily. 15 mL 11  . lidocaine-prilocaine (EMLA) cream Apply 1 application topically as needed. 30 g 0  . lovastatin (MEVACOR) 20 MG tablet TAKE 1 TABLET AT BEDTIME FOR HIGH CHOLESTEROL 90 tablet 0  . Multiple Vitamin (MULTI-VITAMINS) TABS Take 1 tablet by mouth daily.     . ondansetron (ZOFRAN-ODT) 4 MG disintegrating tablet Take by mouth.    . pantoprazole (PROTONIX) 20 MG tablet Take 40 mg by mouth daily.     . Semaglutide,0.25 or 0.5MG /DOS, (OZEMPIC, 0.25 OR 0.5 MG/DOSE,) 2 MG/1.5ML SOPN Inject 0.5 mg into the skin once a week. 4 pen 3  . sharps container 1 each by Does not apply route as needed. To use for insulin syringes and needles. Taking insulin at least twice daily.  E11.65 1 each 5   No current facility-administered medications for this visit.     OBJECTIVE: Vitals:   03/01/18 1036  BP: (!) 153/98  Pulse: 90  Resp: 18  Temp: (!) 97.5 F (36.4 C)     Body mass index is 27.02 kg/m.    ECOG FS:0 - Asymptomatic  General: Well-developed, well-nourished, no acute distress. Eyes: Pink conjunctiva, anicteric sclera. HEENT: Normocephalic, moist mucous membranes. Lungs: Clear to  auscultation bilaterally. Heart: Regular rate and rhythm. No rubs, murmurs, or gallops. Abdomen: Soft, nontender, nondistended. No organomegaly noted, normoactive bowel sounds. Musculoskeletal: No edema, cyanosis, or clubbing. Neuro: Alert, answering all questions appropriately. Cranial nerves grossly intact. Skin: No rashes or petechiae noted. Psych: Normal affect.  LAB RESULTS:  Lab Results  Component Value Date   NA 136 09/12/2017   K 3.9 09/12/2017   CL 103 09/12/2017   CO2 23 09/12/2017   GLUCOSE 187 (H) 09/12/2017   BUN 19 09/12/2017   CREATININE 0.90 09/12/2017   CALCIUM 9.0 09/12/2017   PROT 7.5 02/16/2017   ALBUMIN 4.4 02/16/2017   AST 29 02/16/2017   ALT 35 02/16/2017   ALKPHOS 56 02/16/2017   BILITOT 0.9 02/16/2017   GFRNONAA >60 09/12/2017   GFRAA >60 09/12/2017    Lab Results  Component Value Date   WBC 8.7 03/01/2018  NEUTROABS 5.5 03/01/2018   HGB 14.9 03/01/2018   HCT 44.4 03/01/2018   MCV 84.7 03/01/2018   PLT 241 03/01/2018   Lab Results  Component Value Date   CEA 2.3 09/01/2016     STUDIES: Mm 3d Screen Breast Bilateral  Result Date: 02/20/2018 CLINICAL DATA:  Screening. EXAM: DIGITAL SCREENING BILATERAL MAMMOGRAM WITH TOMO AND CAD COMPARISON:  Previous exam(s). ACR Breast Density Category b: There are scattered areas of fibroglandular density. FINDINGS: There are no findings suspicious for malignancy. Images were processed with CAD. IMPRESSION: No mammographic evidence of malignancy. A result letter of this screening mammogram will be mailed directly to the patient. RECOMMENDATION: Screening mammogram in one year. (Code:SM-B-01Y) BI-RADS CATEGORY  1: Negative. Electronically Signed   By: Lillia Mountain M.D.   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 She had a CT scan completed March 2016 which revealed no evidence of disease. No further imaging is necessary unless there is suspicion of recurrence.  Her most recent CEA continues to be within normal limits at 2.3.  Today's result is pending.  Patient last had a colonoscopy on May 17, 2015 that was reported as normal.  A referral was sent to GI to repeat colonoscopy in January 2020.  Return to clinic in 6 months for routine evaluation. 2.  Port: Patient will require port removal and referral sent to surgery.  I spent a total of 20 minutes face-to-face with the patient of which greater than 50% of the visit was spent in counseling and coordination of care as detailed above.   Patient expressed understanding and was in agreement with this plan. She also understands that She can call clinic at any time with any questions, concerns, or complaints.   Colon cancer [facility])   Staging form: Colon and Rectum, AJCC 7th Edition     Clinical stage from 10/12/2011: Stage IIB (T4a, N0, M0) - Signed by Evlyn Kanner, NP on 10/12/2014   Lloyd Huger, MD   03/01/2018 3:28 PM

## 2018-03-01 ENCOUNTER — Encounter: Payer: Self-pay | Admitting: Oncology

## 2018-03-01 ENCOUNTER — Inpatient Hospital Stay: Payer: Medicare HMO

## 2018-03-01 ENCOUNTER — Inpatient Hospital Stay: Payer: Medicare HMO | Attending: Oncology | Admitting: Oncology

## 2018-03-01 VITALS — BP 153/98 | HR 90 | Temp 97.5°F | Resp 18 | Wt 147.7 lb

## 2018-03-01 DIAGNOSIS — F419 Anxiety disorder, unspecified: Secondary | ICD-10-CM | POA: Diagnosis not present

## 2018-03-01 DIAGNOSIS — E119 Type 2 diabetes mellitus without complications: Secondary | ICD-10-CM

## 2018-03-01 DIAGNOSIS — C182 Malignant neoplasm of ascending colon: Secondary | ICD-10-CM

## 2018-03-01 DIAGNOSIS — Z9221 Personal history of antineoplastic chemotherapy: Secondary | ICD-10-CM | POA: Diagnosis not present

## 2018-03-01 DIAGNOSIS — Z95828 Presence of other vascular implants and grafts: Secondary | ICD-10-CM

## 2018-03-01 DIAGNOSIS — Z791 Long term (current) use of non-steroidal anti-inflammatories (NSAID): Secondary | ICD-10-CM

## 2018-03-01 DIAGNOSIS — Z85038 Personal history of other malignant neoplasm of large intestine: Secondary | ICD-10-CM

## 2018-03-01 DIAGNOSIS — Z87891 Personal history of nicotine dependence: Secondary | ICD-10-CM | POA: Diagnosis not present

## 2018-03-01 DIAGNOSIS — Z923 Personal history of irradiation: Secondary | ICD-10-CM

## 2018-03-01 DIAGNOSIS — I10 Essential (primary) hypertension: Secondary | ICD-10-CM

## 2018-03-01 DIAGNOSIS — Z79899 Other long term (current) drug therapy: Secondary | ICD-10-CM

## 2018-03-01 LAB — CBC WITH DIFFERENTIAL/PLATELET
Abs Immature Granulocytes: 0.03 10*3/uL (ref 0.00–0.07)
BASOS PCT: 1 %
Basophils Absolute: 0 10*3/uL (ref 0.0–0.1)
EOS ABS: 0.3 10*3/uL (ref 0.0–0.5)
EOS PCT: 3 %
HEMATOCRIT: 44.4 % (ref 36.0–46.0)
Hemoglobin: 14.9 g/dL (ref 12.0–15.0)
IMMATURE GRANULOCYTES: 0 %
LYMPHS ABS: 2.1 10*3/uL (ref 0.7–4.0)
Lymphocytes Relative: 24 %
MCH: 28.4 pg (ref 26.0–34.0)
MCHC: 33.6 g/dL (ref 30.0–36.0)
MCV: 84.7 fL (ref 80.0–100.0)
MONOS PCT: 9 %
Monocytes Absolute: 0.8 10*3/uL (ref 0.1–1.0)
Neutro Abs: 5.5 10*3/uL (ref 1.7–7.7)
Neutrophils Relative %: 63 %
PLATELETS: 241 10*3/uL (ref 150–400)
RBC: 5.24 MIL/uL — ABNORMAL HIGH (ref 3.87–5.11)
RDW: 13 % (ref 11.5–15.5)
WBC: 8.7 10*3/uL (ref 4.0–10.5)
nRBC: 0 % (ref 0.0–0.2)

## 2018-03-01 MED ORDER — SODIUM CHLORIDE 0.9% FLUSH
10.0000 mL | INTRAVENOUS | Status: DC | PRN
  Filled 2018-03-01: qty 10

## 2018-03-01 MED ORDER — HEPARIN SOD (PORK) LOCK FLUSH 100 UNIT/ML IV SOLN: 500.0000 [IU] | Freq: Once | INTRAVENOUS | Status: DC

## 2018-03-01 MED ORDER — HEPARIN SOD (PORK) LOCK FLUSH 100 UNIT/ML IV SOLN
500.0000 [IU] | Freq: Once | INTRAVENOUS | Status: AC
  Administered 2018-03-01: 500 [IU] via INTRAVENOUS

## 2018-03-01 MED ORDER — SODIUM CHLORIDE 0.9% FLUSH
10.0000 mL | INTRAVENOUS | Status: DC | PRN
  Administered 2018-03-01: 10 mL via INTRAVENOUS
  Filled 2018-03-01: qty 10

## 2018-03-01 NOTE — Progress Notes (Signed)
Patient here today for colon cancer, denies any concerns today.

## 2018-03-02 LAB — CEA: CEA: 2.5 ng/mL (ref 0.0–4.7)

## 2018-03-13 ENCOUNTER — Other Ambulatory Visit: Payer: Self-pay

## 2018-03-13 DIAGNOSIS — C189 Malignant neoplasm of colon, unspecified: Secondary | ICD-10-CM

## 2018-03-19 DIAGNOSIS — Z95828 Presence of other vascular implants and grafts: Secondary | ICD-10-CM | POA: Diagnosis not present

## 2018-03-22 ENCOUNTER — Other Ambulatory Visit
Admission: RE | Admit: 2018-03-22 | Discharge: 2018-03-22 | Disposition: A | Discharge status: Home / Self Care | Payer: Medicare HMO | Attending: Nurse Practitioner | Admitting: Nurse Practitioner

## 2018-03-22 DIAGNOSIS — I10 Essential (primary) hypertension: Secondary | ICD-10-CM | POA: Diagnosis not present

## 2018-03-22 DIAGNOSIS — E559 Vitamin D deficiency, unspecified: Secondary | ICD-10-CM | POA: Insufficient documentation

## 2018-03-22 DIAGNOSIS — E785 Hyperlipidemia, unspecified: Secondary | ICD-10-CM | POA: Diagnosis not present

## 2018-03-22 DIAGNOSIS — Z0000 Encounter for general adult medical examination without abnormal findings: Secondary | ICD-10-CM | POA: Insufficient documentation

## 2018-03-22 LAB — CBC
HCT: 44.7 % (ref 36.0–46.0)
Hemoglobin: 14.8 g/dL (ref 12.0–15.0)
MCH: 28.2 pg (ref 26.0–34.0)
MCHC: 33.1 g/dL (ref 30.0–36.0)
MCV: 85.1 fL (ref 80.0–100.0)
PLATELETS: 221 10*3/uL (ref 150–400)
RBC: 5.25 MIL/uL — ABNORMAL HIGH (ref 3.87–5.11)
RDW: 13.2 % (ref 11.5–15.5)
WBC: 7.5 10*3/uL (ref 4.0–10.5)
nRBC: 0 % (ref 0.0–0.2)

## 2018-03-22 LAB — COMPREHENSIVE METABOLIC PANEL
ALBUMIN: 4.2 g/dL (ref 3.5–5.0)
ALT: 28 U/L (ref 0–44)
ANION GAP: 10 (ref 5–15)
AST: 24 U/L (ref 15–41)
Alkaline Phosphatase: 54 U/L (ref 38–126)
BUN: 24 mg/dL — ABNORMAL HIGH (ref 8–23)
CALCIUM: 9.2 mg/dL (ref 8.9–10.3)
CO2: 27 mmol/L (ref 22–32)
Chloride: 104 mmol/L (ref 98–111)
Creatinine, Ser: 0.97 mg/dL (ref 0.44–1.00)
GFR calc non Af Amer: 58 mL/min — ABNORMAL LOW (ref >60)
GLUCOSE: 175 mg/dL — AB (ref 70–99)
POTASSIUM: 3.7 mmol/L (ref 3.5–5.1)
SODIUM: 141 mmol/L (ref 135–145)
Total Bilirubin: 0.7 mg/dL (ref 0.3–1.2)
Total Protein: 7.6 g/dL (ref 6.5–8.1)

## 2018-03-22 LAB — LIPID PANEL
CHOL/HDL RATIO: 5.3 ratio
Cholesterol: 165 mg/dL (ref 0–200)
HDL: 31 mg/dL — AB (ref >40)
LDL Cholesterol: 91 mg/dL (ref 0–99)
TRIGLYCERIDES: 216 mg/dL — AB (ref <150)
VLDL: 43 mg/dL — ABNORMAL HIGH (ref 0–40)

## 2018-03-22 LAB — TSH: TSH: 2.339 u[IU]/mL (ref 0.350–4.500)

## 2018-03-22 LAB — T4, FREE: FREE T4: 0.82 ng/dL (ref 0.82–1.77)

## 2018-03-23 ENCOUNTER — Ambulatory Visit
Admission: EM | Admit: 2018-03-23 | Discharge: 2018-03-23 | Disposition: A | Discharge status: Home / Self Care | Payer: Medicare HMO | Attending: Family Medicine | Admitting: Family Medicine

## 2018-03-23 ENCOUNTER — Encounter: Payer: Self-pay | Admitting: Emergency Medicine

## 2018-03-23 ENCOUNTER — Other Ambulatory Visit: Payer: Self-pay

## 2018-03-23 DIAGNOSIS — R059 Cough, unspecified: Secondary | ICD-10-CM

## 2018-03-23 DIAGNOSIS — J0100 Acute maxillary sinusitis, unspecified: Secondary | ICD-10-CM

## 2018-03-23 DIAGNOSIS — B9789 Other viral agents as the cause of diseases classified elsewhere: Secondary | ICD-10-CM | POA: Diagnosis not present

## 2018-03-23 DIAGNOSIS — R05 Cough: Secondary | ICD-10-CM | POA: Diagnosis not present

## 2018-03-23 LAB — VITAMIN D 25 HYDROXY (VIT D DEFICIENCY, FRACTURES): VIT D 25 HYDROXY: 24.7 ng/mL — AB (ref 30.0–100.0)

## 2018-03-23 MED ORDER — BENZONATATE 200 MG PO CAPS: 200.0000 mg | ORAL_CAPSULE | Freq: Three times a day (TID) | ORAL | 0 refills | Status: DC | PRN

## 2018-03-23 MED ORDER — HYDROCOD POLST-CPM POLST ER 10-8 MG/5ML PO SUER: 5.0000 mL | Freq: Every evening | ORAL | 0 refills | Status: DC | PRN

## 2018-03-23 MED ORDER — CITALOPRAM HYDROBROMIDE 20 MG PO TABS: 20.0000 mg | ORAL_TABLET | Freq: Every day | ORAL | 0 refills | Status: DC

## 2018-03-23 MED ORDER — AMOXICILLIN 875 MG PO TABS: 875.0000 mg | ORAL_TABLET | Freq: Two times a day (BID) | ORAL | 0 refills | Status: DC

## 2018-03-23 NOTE — ED Triage Notes (Signed)
Patient c/o sneezing, cough, and runny nose for a month.  Patient states that her granddaughter was recently diagnosed with RSV.

## 2018-03-23 NOTE — ED Provider Notes (Signed)
[facility]    CSN: [id number] Arrival date & time: 03/23/18  0857     History   Chief Complaint Chief Complaint  Patient presents with  . Cough    HPI Tiffany Hawkins is a 72 y.o. female.   The history is provided by the patient.  Cough  Associated symptoms: rhinorrhea   URI  Presenting symptoms: congestion, cough and rhinorrhea   Severity:  Moderate Onset quality:  Sudden Duration:  1 month Timing:  Constant Progression:  Unchanged Chronicity:  New Relieved by:  Nothing Ineffective treatments:  OTC medications Associated symptoms: sinus pain   Risk factors: sick contacts     Past Medical History:  Diagnosis Date  . Anxiety   . Colon cancer ([place]) 10/12/2014   had chemo  . Diabetes mellitus without complication ([place])   . Hyperlipidemia   . Hypertension   . Lateral epicondylitis   . Personal history of chemotherapy   . Polio    childhood  . Sleep apnea    no CPAP    Patient Active Problem List   Diagnosis Date Noted  . Dysuria 02/23/2018  . 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 Encounter for general adult medical examination with abnormal findings 02/10/2018  . Acute non-recurrent pansinusitis 08/24/2017  . Other headache syndrome 08/24/2017  . Uncontrolled type 2 diabetes mellitus with hyperglycemia ([place]) 08/24/2017  . Uncontrolled type 2 diabetes mellitus with hypoglycemia ([place]) 08/22/2017  . Need for vaccination against Streptococcus pneumoniae using pneumococcal conjugate vaccine 13 08/22/2017  . Essential hypertension 05/03/2017  . Otalgia, bilateral 05/02/2017  . Type 2 diabetes mellitus with hyperglycemia ([place]) 05/02/2017  . Mixed hyperlipidemia 05/02/2017  . Right lower quadrant pain 06/29/2016  . 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 History of colon cancer 04/13/2015  . Cancer of ascending colon ([place]) 10/12/2014    Past Surgical History:  Procedure Laterality Date    . APPENDECTOMY    . BACK SURGERY    . CATARACT EXTRACTION W/PHACO Left 12/03/2017   Procedure: CATARACT EXTRACTION PHACO AND INTRAOCULAR LENS PLACEMENT ([place]) LEFT  DIABETIC;  Surgeon: Eulogio Bear, MD;  Location: [place];  Service: Ophthalmology;  Laterality: Left;  Diabetic - insulin  . CATARACT EXTRACTION W/PHACO Right 01/14/2018   Procedure: CATARACT EXTRACTION PHACO AND INTRAOCULAR LENS PLACEMENT ([place]) RIGHT DIABETIC;  Surgeon: Eulogio Bear, MD;  Location: [place];  Service: Ophthalmology;  Laterality: Right;  Diabetic - insulin  . CERVICAL DISC SURGERY    . CHOLECYSTECTOMY    . COLON SURGERY    . COLONOSCOPY    . COLONOSCOPY WITH PROPOFOL N/A 05/17/2015   Procedure: COLONOSCOPY WITH PROPOFOL;  Surgeon: Manya Silvas, MD;  Location: [facility] ENDOSCOPY;  Service: Endoscopy;  Laterality: N/A;  . ESOPHAGOGASTRODUODENOSCOPY    . HERNIA REPAIR    . PORT A CATH INJECTION ([place] HX)    . TUBAL LIGATION      OB History    Gravida  2   Para  2   Term  1   Preterm  1   AB      Living  2     SAB      TAB      Ectopic      Multiple      Live Births               Home Medications    Prior to Admission medications  Medication Sig Start Date End Date Taking? Authorizing Provider  ALPRAZolam (XANAX) 0.25 MG tablet Take 0.25 mg by mouth 2 (two) times daily as needed for anxiety.   Yes [provider]  amLODipine (NORVASC) 5 MG tablet TAKE 1 TABLET EVERY DAY 05/28/17  Yes Ronnell Freshwater, NP  citalopram (CELEXA) 20 MG tablet Take 2 tablets daily for depression 05/18/17  Yes Boscia, Heather E, NP  fenofibrate 54 MG tablet Take 1 tablet (54 mg total) by mouth daily. 08/24/17  Yes Boscia, Heather E, NP  insulin aspart protamine - aspart (NOVOLOG MIX 70/30 FLEXPEN) (70-30) 100 UNIT/ML FlexPen Inject 0.15 mLs (15 Units total) into the skin 2 (two) times daily. 05/16/17  Yes Boscia, Heather E, NP  lovastatin (MEVACOR) 20 MG tablet TAKE 1  TABLET AT BEDTIME FOR HIGH CHOLESTEROL 02/19/18  Yes Boscia, Heather E, NP  pantoprazole (PROTONIX) 20 MG tablet Take 40 mg by mouth daily.    Yes [provider]  Semaglutide,0.25 or 0.5MG /DOS, (OZEMPIC, 0.25 OR 0.5 MG/DOSE,) 2 MG/1.5ML SOPN Inject 0.5 mg into the skin once a week. 01/14/18  Yes Boscia, Greer Ee, NP  amoxicillin (AMOXIL) 875 MG tablet Take 1 tablet (875 mg total) by mouth 2 (two) times daily. 03/23/18   Norval Gable, MD  benzonatate (TESSALON) 200 MG capsule Take 1 capsule (200 mg total) by mouth 3 (three) times daily as needed. 03/23/18   Norval Gable, MD  Blood Glucose Calibration (ACCU-CHEK SMARTVIEW CONTROL VI) by In Vitro route. Use as di    [provider]  butalbital-acetaminophen-caffeine (FIORICET, ESGIC) [phone] MG tablet Take 1 tablet by mouth every 6 (six) hours as needed for headache. 08/24/17 08/24/18  Ronnell Freshwater, NP  Celecoxib (CELEBREX PO) Take by mouth.    [provider]  chlorpheniramine-HYDROcodone (TUSSIONEX PENNKINETIC ER) 10-8 MG/5ML SUER Take 5 mLs by mouth at bedtime as needed. 03/23/18   Norval Gable, MD  citalopram (CELEXA) 20 MG tablet Take 1 tablet (20 mg total) by mouth daily. 03/23/18   Norval Gable, MD  glucose blood (ACCU-CHEK SMARTVIEW) test strip Blood sugar checks TID 11/06/17   Ronnell Freshwater, NP  ibuprofen (ADVIL,MOTRIN) 800 MG tablet Take 800 mg by mouth every 8 (eight) hours as needed.    [provider]  lidocaine-prilocaine (EMLA) cream Apply 1 application topically as needed. 10/12/14   Evlyn Kanner, NP  Multiple Vitamin (MULTI-VITAMINS) TABS Take 1 tablet by mouth daily.     [provider]  ondansetron (ZOFRAN-ODT) 4 MG disintegrating tablet Take by mouth. 04/13/15   [provider]  sharps container 1 each by Does not apply route as needed. To use for insulin syringes and needles. Taking insulin at least twice daily.  E11.65 11/06/17   Ronnell Freshwater, NP    Family  History Family History  Problem Relation Age of Onset  . Alzheimer's disease Mother   . Heart disease Father   . Colon cancer Sister   . Colon cancer Cousin   . Colon cancer Cousin   . Breast cancer Neg Hx     Social History Social History   Tobacco Use  . Smoking status: Former Smoker    Packs/day: 1.00    Types: Cigarettes    Last attempt to quit: 04/17/1981    Years since quitting: 36.9  . Smokeless tobacco: Never Used  Substance Use Topics  . Alcohol use: No  . Drug use: No     Allergies   Patient has no known  allergies.   Review of Systems Review of Systems  HENT: Positive for congestion, rhinorrhea and sinus pain.   Respiratory: Positive for cough.      Physical Exam Triage Vital Signs ED Triage Vitals  Enc Vitals Group     BP 03/23/18 0916 (!) 149/90     Pulse Rate 03/23/18 0916 87     Resp 03/23/18 0916 16     Temp 03/23/18 0916 97.6 F (36.4 C)     Temp Source 03/23/18 0916 Oral     SpO2 03/23/18 0916 98 %     Weight 03/23/18 0911 148 lb (67.1 kg)     Height 03/23/18 0911 5\' 2"  (1.575 m)     Head Circumference --      Peak Flow --      Pain Score 03/23/18 0911 0     Pain Loc --      Pain Edu? --      Excl. in [place]? --    No data found.  Updated Vital Signs BP (!) 149/90 (BP Location: Left Arm)   Pulse 87   Temp 97.6 F (36.4 C) (Oral)   Resp 16   Ht 5\' 2"  (1.575 m)   Wt 67.1 kg   SpO2 98%   BMI 27.07 kg/m   Visual Acuity Right Eye Distance:   Left Eye Distance:   Bilateral Distance:    Right Eye Near:   Left Eye Near:    Bilateral Near:     Physical Exam  Constitutional: She appears well-developed and well-nourished. No distress.  HENT:  Head: Normocephalic and atraumatic.  Right Ear: Tympanic membrane, external ear and ear canal normal.  Left Ear: Tympanic membrane, external ear and ear canal normal.  Nose: Mucosal edema and rhinorrhea present. No nose lacerations, sinus tenderness, nasal deformity, septal deviation or nasal  septal hematoma. No epistaxis.  No foreign bodies. Right sinus exhibits maxillary sinus tenderness and frontal sinus tenderness. Left sinus exhibits maxillary sinus tenderness and frontal sinus tenderness.  Mouth/Throat: Uvula is midline, oropharynx is clear and moist and mucous membranes are normal. No oropharyngeal exudate.  Eyes: Conjunctivae are normal. Right eye exhibits no discharge. Left eye exhibits no discharge. No scleral icterus.  Neck: Normal range of motion. Neck supple. No thyromegaly present.  Cardiovascular: Normal rate, regular rhythm and normal heart sounds.  Pulmonary/Chest: Effort normal and breath sounds normal. No stridor. No respiratory distress. She has no wheezes. She has no rales.  Lymphadenopathy:    She has no cervical adenopathy.  Skin: She is not diaphoretic.  Nursing note and vitals reviewed.    UC Treatments / Results  Labs (all labs ordered are listed, but only abnormal results are displayed) Labs Reviewed - No data to display  EKG None  Radiology No results found.  Procedures Procedures (including critical care time)  Medications Ordered in UC Medications - No data to display  Initial Impression / Assessment and Plan / UC Course  I have reviewed the triage vital signs and the nursing notes.  Pertinent labs & imaging results that were available during my care of the patient were reviewed by me and considered in my medical decision making (see chart for details).      Final Clinical Impressions(s) / UC Diagnoses   Final diagnoses:  Acute maxillary sinusitis, recurrence not specified  Cough    ED Prescriptions    Medication Sig Dispense Auth. Provider   amoxicillin (AMOXIL) 875 MG tablet Take 1 tablet (875 mg total) by  mouth 2 (two) times daily. 20 tablet Norval Gable, MD   benzonatate (TESSALON) 200 MG capsule Take 1 capsule (200 mg total) by mouth 3 (three) times daily as needed. 30 capsule Norval Gable, MD    chlorpheniramine-HYDROcodone (TUSSIONEX PENNKINETIC ER) 10-8 MG/5ML SUER Take 5 mLs by mouth at bedtime as needed. 60 mL Norval Gable, MD   citalopram (CELEXA) 20 MG tablet Take 1 tablet (20 mg total) by mouth daily. 15 tablet Norval Gable, MD     1. diagnosis reviewed with patient 2. rx as per orders above; reviewed possible side effects, interactions, risks and benefits   3. Follow-up prn if symptoms worsen or don't improve    Controlled Substance Prescriptions [place] Controlled Substance Registry consulted? Not Applicable   Norval Gable, MD 03/23/18 1451

## 2018-03-25 ENCOUNTER — Other Ambulatory Visit: Payer: Self-pay

## 2018-03-25 ENCOUNTER — Encounter: Payer: Self-pay | Admitting: *Deleted

## 2018-03-26 ENCOUNTER — Other Ambulatory Visit: Payer: Self-pay

## 2018-03-26 DIAGNOSIS — E1165 Type 2 diabetes mellitus with hyperglycemia: Secondary | ICD-10-CM

## 2018-03-26 MED ORDER — GLUCOSE BLOOD VI STRP: ORAL_STRIP | 5 refills | Status: DC

## 2018-03-28 NOTE — Anesthesia Preprocedure Evaluation (Addendum)
Anesthesia Evaluation  Patient identified by MRN, date of birth, ID band Patient awake    Reviewed: Allergy & Precautions, NPO status , Patient's Chart, lab work & pertinent test results  History of Anesthesia Complications Negative for: history of anesthetic complications  Airway Mallampati: II   Neck ROM: Full    Dental no notable dental hx.    Pulmonary sleep apnea , former smoker (quit 1983),    Pulmonary exam normal breath sounds clear to auscultation       Cardiovascular Exercise Tolerance: Good hypertension, Normal cardiovascular exam Rhythm:Regular Rate:Normal     Neuro/Psych  Headaches, PSYCHIATRIC DISORDERS Anxiety    GI/Hepatic GERD  ,  Endo/Other  diabetes, Type 2, Insulin Dependent  Renal/GU negative Renal ROS     Musculoskeletal   Abdominal   Peds  Hematology Colon CA   Anesthesia Other Findings   Reproductive/Obstetrics                            Anesthesia Physical Anesthesia Plan  ASA: III  Anesthesia Plan: General   Post-op Pain Management:    Induction: Intravenous  PONV Risk Score and Plan: 3 and TIVA and Propofol infusion  Airway Management Planned: Natural Airway  Additional Equipment:   Intra-op Plan:   Post-operative Plan:   Informed Consent: I have reviewed the patients History and Physical, chart, labs and discussed the procedure including the risks, benefits and alternatives for the proposed anesthesia with the patient or authorized representative who has indicated his/her understanding and acceptance.     Plan Discussed with: CRNA  Anesthesia Plan Comments:        Anesthesia Quick Evaluation

## 2018-03-29 NOTE — Discharge Instructions (Signed)
General Anesthesia, Adult, Care After °These instructions provide you with information about caring for yourself after your procedure. Your health care provider may also give you more specific instructions. Your treatment has been planned according to current medical practices, but problems sometimes occur. Call your health care provider if you have any problems or questions after your procedure. °What can I expect after the procedure? °After the procedure, it is common to have: °· Vomiting. °· A sore throat. °· Mental slowness. ° °It is common to feel: °· Nauseous. °· Cold or shivery. °· Sleepy. °· Tired. °· Sore or achy, even in parts of your body where you did not have surgery. ° °Follow these instructions at home: °For at least 24 hours after the procedure: °· Do not: °? Participate in activities where you could fall or become injured. °? Drive. °? Use heavy machinery. °? Drink alcohol. °? Take sleeping pills or medicines that cause drowsiness. °? Make important decisions or sign legal documents. °? Take care of children on your own. °· Rest. °Eating and drinking °· If you vomit, drink water, juice, or soup when you can drink without vomiting. °· Drink enough fluid to keep your urine clear or pale yellow. °· Make sure you have little or no nausea before eating solid foods. °· Follow the diet recommended by your health care provider. °General instructions °· Have a responsible adult stay with you until you are awake and alert. °· Return to your normal activities as told by your health care provider. Ask your health care provider what activities are safe for you. °· Take over-the-counter and prescription medicines only as told by your health care provider. °· If you smoke, do not smoke without supervision. °· Keep all follow-up visits as told by your health care provider. This is important. °Contact a health care provider if: °· You continue to have nausea or vomiting at home, and medicines are not helpful. °· You  cannot drink fluids or start eating again. °· You cannot urinate after 8-12 hours. °· You develop a skin rash. °· You have fever. °· You have increasing redness at the site of your procedure. °Get help right away if: °· You have difficulty breathing. °· You have chest pain. °· You have unexpected bleeding. °· You feel that you are having a life-threatening or urgent problem. °This information is not intended to replace advice given to you by your health care provider. Make sure you discuss any questions you have with your health care provider. °Document Released: 07/10/2000 Document Revised: 09/06/2015 Document Reviewed: 03/18/2015 °Elsevier Interactive Patient Education © 2018 [employer] ° °

## 2018-04-01 ENCOUNTER — Ambulatory Visit: Payer: Medicare HMO | Admitting: Anesthesiology

## 2018-04-01 ENCOUNTER — Encounter
Admission: RE | Disposition: A | Discharge status: Home / Self Care | Payer: Self-pay | Source: Home / Self Care | Attending: Gastroenterology

## 2018-04-01 ENCOUNTER — Ambulatory Visit
Admission: RE | Admit: 2018-04-01 | Discharge: 2018-04-01 | Disposition: A | Discharge status: Home / Self Care | Payer: Medicare HMO | Attending: Gastroenterology | Admitting: Gastroenterology

## 2018-04-01 DIAGNOSIS — Z85038 Personal history of other malignant neoplasm of large intestine: Secondary | ICD-10-CM

## 2018-04-01 DIAGNOSIS — Z8249 Family history of ischemic heart disease and other diseases of the circulatory system: Secondary | ICD-10-CM | POA: Insufficient documentation

## 2018-04-01 DIAGNOSIS — K219 Gastro-esophageal reflux disease without esophagitis: Secondary | ICD-10-CM | POA: Insufficient documentation

## 2018-04-01 DIAGNOSIS — G4730 Sleep apnea, unspecified: Secondary | ICD-10-CM | POA: Diagnosis not present

## 2018-04-01 DIAGNOSIS — E785 Hyperlipidemia, unspecified: Secondary | ICD-10-CM | POA: Insufficient documentation

## 2018-04-01 DIAGNOSIS — D125 Benign neoplasm of sigmoid colon: Secondary | ICD-10-CM | POA: Diagnosis not present

## 2018-04-01 DIAGNOSIS — K641 Second degree hemorrhoids: Secondary | ICD-10-CM | POA: Diagnosis not present

## 2018-04-01 DIAGNOSIS — E119 Type 2 diabetes mellitus without complications: Secondary | ICD-10-CM | POA: Diagnosis not present

## 2018-04-01 DIAGNOSIS — Z87891 Personal history of nicotine dependence: Secondary | ICD-10-CM | POA: Insufficient documentation

## 2018-04-01 DIAGNOSIS — K635 Polyp of colon: Secondary | ICD-10-CM

## 2018-04-01 DIAGNOSIS — Z79899 Other long term (current) drug therapy: Secondary | ICD-10-CM | POA: Insufficient documentation

## 2018-04-01 DIAGNOSIS — Z791 Long term (current) use of non-steroidal anti-inflammatories (NSAID): Secondary | ICD-10-CM | POA: Insufficient documentation

## 2018-04-01 DIAGNOSIS — Z794 Long term (current) use of insulin: Secondary | ICD-10-CM | POA: Diagnosis not present

## 2018-04-01 DIAGNOSIS — I10 Essential (primary) hypertension: Secondary | ICD-10-CM | POA: Insufficient documentation

## 2018-04-01 DIAGNOSIS — Z9221 Personal history of antineoplastic chemotherapy: Secondary | ICD-10-CM | POA: Diagnosis not present

## 2018-04-01 DIAGNOSIS — K5730 Diverticulosis of large intestine without perforation or abscess without bleeding: Secondary | ICD-10-CM | POA: Insufficient documentation

## 2018-04-01 DIAGNOSIS — F419 Anxiety disorder, unspecified: Secondary | ICD-10-CM | POA: Diagnosis not present

## 2018-04-01 DIAGNOSIS — Z08 Encounter for follow-up examination after completed treatment for malignant neoplasm: Secondary | ICD-10-CM | POA: Diagnosis not present

## 2018-04-01 HISTORY — PX: COLONOSCOPY WITH PROPOFOL: SHX5780

## 2018-04-01 HISTORY — PX: POLYPECTOMY: SHX5525

## 2018-04-01 LAB — GLUCOSE, CAPILLARY
Glucose-Capillary: 152 mg/dL — ABNORMAL HIGH (ref 70–99)
Glucose-Capillary: 140 mg/dL — ABNORMAL HIGH (ref 70–99)

## 2018-04-01 SURGERY — COLONOSCOPY WITH PROPOFOL
Anesthesia: General | Site: Rectum

## 2018-04-01 MED ORDER — LIDOCAINE HCL (CARDIAC) PF 100 MG/5ML IV SOSY
PREFILLED_SYRINGE | INTRAVENOUS | Status: DC | PRN
  Administered 2018-04-01: 30 mg via INTRAVENOUS

## 2018-04-01 MED ORDER — STERILE WATER FOR IRRIGATION IR SOLN
Status: DC | PRN
  Administered 2018-04-01: 08:00:00

## 2018-04-01 MED ORDER — ONDANSETRON HCL 4 MG/2ML IJ SOLN: 4.0000 mg | Freq: Once | INTRAMUSCULAR | Status: DC | PRN

## 2018-04-01 MED ORDER — ACETAMINOPHEN 160 MG/5ML PO SOLN: 325.0000 mg | ORAL | Status: DC | PRN

## 2018-04-01 MED ORDER — ACETAMINOPHEN 325 MG PO TABS: 650.0000 mg | ORAL_TABLET | Freq: Once | ORAL | Status: DC | PRN

## 2018-04-01 MED ORDER — PROPOFOL 10 MG/ML IV BOLUS
INTRAVENOUS | Status: DC | PRN
  Administered 2018-04-01 (×2): 40 mg via INTRAVENOUS
  Administered 2018-04-01: 100 mg via INTRAVENOUS
  Administered 2018-04-01 (×3): 40 mg via INTRAVENOUS

## 2018-04-01 MED ORDER — LACTATED RINGERS IV SOLN
1000.0000 mL | INTRAVENOUS | Status: DC
  Administered 2018-04-01: 1000 mL via INTRAVENOUS

## 2018-04-01 SURGICAL SUPPLY — 9 items
CANISTER SUCT 1200ML W/VALVE (MISCELLANEOUS) ×3 IMPLANT
CLIP HMST 235XBRD CATH ROT (MISCELLANEOUS) ×1 IMPLANT
CLIP RESOLUTION 360 11X235 (MISCELLANEOUS) ×2
GOWN CVR UNV OPN BCK APRN NK (MISCELLANEOUS) ×2 IMPLANT
GOWN ISOL THUMB LOOP REG UNIV (MISCELLANEOUS) ×4
KIT ENDO PROCEDURE OLY (KITS) ×3 IMPLANT
SNARE SHORT THROW 13M SML OVAL (MISCELLANEOUS) ×3 IMPLANT
TRAP ETRAP POLY (MISCELLANEOUS) ×3 IMPLANT
WATER STERILE IRR 250ML POUR (IV SOLUTION) ×3 IMPLANT

## 2018-04-01 NOTE — Anesthesia Procedure Notes (Signed)
Performed by: Jessa Stinson, CRNA Pre-anesthesia Checklist: Patient identified, Emergency Drugs available, Suction available, Timeout performed and Patient being monitored Patient Re-evaluated:Patient Re-evaluated prior to induction Oxygen Delivery Method: Nasal cannula Placement Confirmation: positive ETCO2       

## 2018-04-01 NOTE — H&P (Signed)
[name], MD [facility] [address] [place], [place] [address] [phone] Fax : [phone]  Primary Care Physician:  Ronnell Freshwater, NP Primary Gastroenterologist:  Dr. Allen Norris  Pre-Procedure History & Physical: HPI:  Tiffany Hawkins is a 72 y.o. female is here for an colonoscopy.   Past Medical History:  Diagnosis Date  . Anxiety   . Colon cancer ([place]) 10/12/2014   had chemo  . Diabetes mellitus without complication ([place])   . Hyperlipidemia   . Hypertension   . Lateral epicondylitis   . Personal history of chemotherapy   . Polio    childhood  . Sleep apnea    no CPAP, sx improved with wt loss    Past Surgical History:  Procedure Laterality Date  . APPENDECTOMY    . BACK SURGERY    . CATARACT EXTRACTION W/PHACO Left 12/03/2017   Procedure: CATARACT EXTRACTION PHACO AND INTRAOCULAR LENS PLACEMENT ([place]) LEFT  DIABETIC;  Surgeon: Eulogio Bear, MD;  Location: [place];  Service: Ophthalmology;  Laterality: Left;  Diabetic - insulin  . CATARACT EXTRACTION W/PHACO Right 01/14/2018   Procedure: CATARACT EXTRACTION PHACO AND INTRAOCULAR LENS PLACEMENT ([place]) RIGHT DIABETIC;  Surgeon: Eulogio Bear, MD;  Location: [place];  Service: Ophthalmology;  Laterality: Right;  Diabetic - insulin  . CERVICAL DISC SURGERY    . CHOLECYSTECTOMY    . COLON SURGERY    . COLONOSCOPY    . COLONOSCOPY WITH PROPOFOL N/A 05/17/2015   Procedure: COLONOSCOPY WITH PROPOFOL;  Surgeon: Manya Silvas, MD;  Location: [facility] ENDOSCOPY;  Service: Endoscopy;  Laterality: N/A;  . ESOPHAGOGASTRODUODENOSCOPY    . HERNIA REPAIR    . PORT A CATH INJECTION ([place] HX)     Port has been removed  . TUBAL LIGATION      Prior to Admission medications   Medication Sig Start Date End Date Taking? Authorizing Provider  ALPRAZolam (XANAX) 0.25 MG tablet Take 0.25 mg by mouth 2 (two) times daily as needed for anxiety.   Yes [provider]  amLODipine (NORVASC)  5 MG tablet TAKE 1 TABLET EVERY DAY 05/28/17  Yes Boscia, Heather E, NP  amoxicillin (AMOXIL) 875 MG tablet Take 1 tablet (875 mg total) by mouth 2 (two) times daily. 03/23/18  Yes Norval Gable, MD  benzonatate (TESSALON) 200 MG capsule Take 1 capsule (200 mg total) by mouth 3 (three) times daily as needed. 03/23/18  Yes Norval Gable, MD  Blood Glucose Calibration (ACCU-CHEK SMARTVIEW CONTROL VI) by In Vitro route. Use as di   Yes [provider]  chlorpheniramine-HYDROcodone (TUSSIONEX PENNKINETIC ER) 10-8 MG/5ML SUER Take 5 mLs by mouth at bedtime as needed. 03/23/18  Yes Norval Gable, MD  citalopram (CELEXA) 20 MG tablet Take 2 tablets daily for depression 05/18/17  Yes Boscia, Heather E, NP  fenofibrate 54 MG tablet Take 1 tablet (54 mg total) by mouth daily. 08/24/17  Yes Ronnell Freshwater, NP  glucose blood (ACCU-CHEK SMARTVIEW) test strip Blood sugar checks TID 03/26/18  Yes Boscia, Heather E, NP  ibuprofen (ADVIL,MOTRIN) 800 MG tablet Take 800 mg by mouth every 8 (eight) hours as needed.   Yes [provider]  insulin aspart protamine - aspart (NOVOLOG MIX 70/30 FLEXPEN) (70-30) 100 UNIT/ML FlexPen Inject 0.15 mLs (15 Units total) into the skin 2 (two) times daily. 05/16/17  Yes Ronnell Freshwater, NP  lovastatin (MEVACOR) 20 MG tablet TAKE 1 TABLET AT BEDTIME FOR HIGH CHOLESTEROL 02/19/18  Yes Boscia,  Heather E, NP  ondansetron (ZOFRAN-ODT) 4 MG disintegrating tablet Take by mouth. 04/13/15  Yes [provider]  pantoprazole (PROTONIX) 20 MG tablet Take 40 mg by mouth daily.    Yes [provider]  Semaglutide,0.25 or 0.5MG /DOS, (OZEMPIC, 0.25 OR 0.5 MG/DOSE,) 2 MG/1.5ML SOPN Inject 0.5 mg into the skin once a week. 01/14/18  Yes Ronnell Freshwater, NP  sharps container 1 each by Does not apply route as needed. To use for insulin syringes and needles. Taking insulin at least twice daily.  E11.65 11/06/17  Yes Boscia, Greer Ee, NP    butalbital-acetaminophen-caffeine (FIORICET, ESGIC) 50-325-40 MG tablet Take 1 tablet by mouth every 6 (six) hours as needed for headache. Patient not taking: Reported on 03/25/2018 08/24/17 08/24/18  Ronnell Freshwater, NP  Celecoxib (CELEBREX PO) Take by mouth.    [provider]  citalopram (CELEXA) 20 MG tablet Take 1 tablet (20 mg total) by mouth daily. 03/23/18   Norval Gable, MD  lidocaine-prilocaine (EMLA) cream Apply 1 application topically as needed. Patient not taking: Reported on 03/25/2018 10/12/14   Evlyn Kanner, NP    Allergies as of 03/13/2018  . (No Known Allergies)    Family History  Problem Relation Age of Onset  . Alzheimer's disease Mother   . Heart disease Father   . Colon cancer Sister   . Colon cancer Cousin   . Colon cancer Cousin   . Breast cancer Neg Hx     Social History   Socioeconomic History  . Marital status: Married    Spouse name: Not on file  . Number of children: Not on file  . Years of education: Not on file  . Highest education level: Not on file  Occupational History  . Not on file  Social Needs  . Financial resource strain: Not on file  . Food insecurity:    Worry: Not on file    Inability: Not on file  . Transportation needs:    Medical: Not on file    Non-medical: Not on file  Tobacco Use  . Smoking status: Former Smoker    Packs/day: 1.00    Types: Cigarettes    Last attempt to quit: 04/17/1981    Years since quitting: 36.9  . Smokeless tobacco: Never Used  Substance and Sexual Activity  . Alcohol use: No  . Drug use: No  . Sexual activity: Never  Lifestyle  . Physical activity:    Days per week: Not on file    Minutes per session: Not on file  . Stress: Not on file  Relationships  . Social connections:    Talks on phone: Not on file    Gets together: Not on file    Attends religious service: Not on file    Active member of club or organization: Not on file    Attends meetings of clubs or organizations:  Not on file    Relationship status: Not on file  . Intimate partner violence:    Fear of current or ex partner: Not on file    Emotionally abused: Not on file    Physically abused: Not on file    Forced sexual activity: Not on file  Other Topics Concern  . Not on file  Social History Narrative  . Not on file    Review of Systems: See HPI, otherwise negative ROS  Physical Exam: BP 140/85   Pulse 86   Temp 98.6 F (37 C) (Temporal)   Resp 16  Ht 5\' 2"  (1.575 m)   Wt 64.9 kg   SpO2 98%   BMI 26.16 kg/m  General:   Alert,  pleasant and cooperative in NAD Head:  Normocephalic and atraumatic. Neck:  Supple; no masses or thyromegaly. Lungs:  Clear throughout to auscultation.    Heart:  Regular rate and rhythm. Abdomen:  Soft, nontender and nondistended. Normal bowel sounds, without guarding, and without rebound.   Neurologic:  Alert and  oriented x4;  grossly normal neurologically.  Impression/Plan: Tiffany Hawkins is here for an colonoscopy to be performed for histroy of colon cancer.  Risks, benefits, limitations, and alternatives regarding  colonoscopy have been reviewed with the patient.  Questions have been answered.  All parties agreeable.   [name], MD  04/01/2018, 7:39 AM

## 2018-04-01 NOTE — Transfer of Care (Signed)
Immediate Anesthesia Transfer of Care Note  Patient: Tiffany Hawkins  Procedure(s) Performed: COLONOSCOPY WITH BIOPSY (N/A Rectum) POLYPECTOMY (N/A Rectum)  Patient Location: PACU  Anesthesia Type: General  Level of Consciousness: awake, alert  and patient cooperative  Airway and Oxygen Therapy: Patient Spontanous Breathing and Patient connected to supplemental oxygen  Post-op Assessment: Post-op Vital signs reviewed, Patient's Cardiovascular Status Stable, Respiratory Function Stable, Patent Airway and No signs of Nausea or vomiting  Post-op Vital Signs: Reviewed and stable  Complications: No apparent anesthesia complications

## 2018-04-01 NOTE — Op Note (Signed)
[facility] Gastroenterology Patient Name: Tiffany Hawkins Procedure Date: 04/01/2018 7:45 AM MRN: [id number] Account #: [id number] Date of Birth: [birth year] Admit Type: Outpatient Age: 72 Room: [facility] OR ROOM 01 Gender: Female Note Status: Finalized Procedure:            Colonoscopy Indications:          High risk colon cancer surveillance: Personal history                        of colon cancer 2014 Providers:            [name] MD, MD Referring MD:         Lavera Guise, MD (Referring MD) Medicines:            Propofol per Anesthesia Complications:        No immediate complications. Procedure:            Pre-Anesthesia Assessment:                       - Prior to the procedure, a History and Physical was                        performed, and patient medications and allergies were                        reviewed. The patient's tolerance of previous                        anesthesia was also reviewed. The risks and benefits of                        the procedure and the sedation options and risks were                        discussed with the patient. All questions were                        answered, and informed consent was obtained. Prior                        Anticoagulants: The patient has taken no previous                        anticoagulant or antiplatelet agents. ASA Grade                        Assessment: II - A patient with mild systemic disease.                        After reviewing the risks and benefits, the patient was                        deemed in satisfactory condition to undergo the                        procedure.                       After obtaining informed consent, the colonoscope was  passed under direct vision. Throughout the procedure,                        the patient's blood pressure, pulse, and oxygen                        saturations were monitored continuously. The was   introduced through the anus and advanced to the the                        ileocolonic anastomosis. The colonoscopy was performed                        without difficulty. The patient tolerated the procedure                        well. The quality of the bowel preparation was                        excellent. Findings:      The perianal and digital rectal examinations were normal.      Multiple small-mouthed diverticula were found in the sigmoid colon and       descending colon.      A 5 mm polyp was found in the sigmoid colon. The polyp was sessile. The       polyp was removed with a cold snare. Resection and retrieval were       complete. To prevent bleeding post-intervention, one hemostatic clip was       successfully placed (MR conditional). There was no bleeding at the end       of the procedure.      Non-bleeding internal hemorrhoids were found during retroflexion. The       hemorrhoids were Grade II (internal hemorrhoids that prolapse but reduce       spontaneously). Impression:           - Diverticulosis in the sigmoid colon and in the                        descending colon.                       - One 5 mm polyp in the sigmoid colon, removed with a                        cold snare. Resected and retrieved. Clip (MR                        conditional) was placed.                       - Non-bleeding internal hemorrhoids. Recommendation:       - Discharge patient to home.                       - Resume previous diet.                       - Continue present medications.                       - Await pathology results.                       -  Repeat colonoscopy in 5 years for surveillance. Procedure Code(s):    --- Professional ---                       [phone], Colonoscopy, flexible; with removal of tumor(s),                        polyp(s), or other lesion(s) by snare technique Diagnosis Code(s):    --- Professional ---                       W73.710, Personal history of other  malignant neoplasm                        of large intestine                       D12.5, Benign neoplasm of sigmoid colon CPT copyright 2018 [employer]. All rights reserved. The codes documented in this report are preliminary and upon coder review may  be revised to meet current compliance requirements. [name] MD, MD 04/01/2018 8:19:56 AM This report has been signed electronically. Number of Addenda: 0 Note Initiated On: 04/01/2018 7:45 AM Scope Withdrawal Time: 0 hours 8 minutes 30 seconds  Total Procedure Duration: 0 hours 19 minutes 43 seconds       [facility]

## 2018-04-01 NOTE — Anesthesia Postprocedure Evaluation (Signed)
Anesthesia Post Note  Patient: Tiffany Hawkins  Procedure(s) Performed: COLONOSCOPY WITH BIOPSY (N/A Rectum) POLYPECTOMY (N/A Rectum)  Patient location during evaluation: PACU Anesthesia Type: General Level of consciousness: awake and alert, oriented and patient cooperative Pain management: pain level controlled Vital Signs Assessment: post-procedure vital signs reviewed and stable Respiratory status: spontaneous breathing, nonlabored ventilation and respiratory function stable Cardiovascular status: blood pressure returned to baseline and stable Postop Assessment: adequate PO intake Anesthetic complications: no    Darrin Nipper

## 2018-04-02 ENCOUNTER — Encounter: Payer: Self-pay | Admitting: Gastroenterology

## 2018-04-11 ENCOUNTER — Other Ambulatory Visit: Payer: Self-pay | Admitting: Internal Medicine

## 2018-04-12 ENCOUNTER — Other Ambulatory Visit: Payer: Self-pay

## 2018-04-12 MED ORDER — AMLODIPINE BESYLATE 5 MG PO TABS: 5.0000 mg | ORAL_TABLET | Freq: Every day | ORAL | 3 refills | Status: DC

## 2018-04-26 ENCOUNTER — Other Ambulatory Visit: Payer: Self-pay

## 2018-04-26 MED ORDER — LOVASTATIN 20 MG PO TABS: ORAL_TABLET | ORAL | 1 refills | Status: DC

## 2018-05-09 ENCOUNTER — Ambulatory Visit (INDEPENDENT_AMBULATORY_CARE_PROVIDER_SITE_OTHER): Payer: Medicare HMO | Admitting: Nurse Practitioner

## 2018-05-09 ENCOUNTER — Encounter: Payer: Self-pay | Admitting: Nurse Practitioner

## 2018-05-09 VITALS — BP 154/86 | HR 90 | Temp 97.5°F | Resp 16 | Ht 62.0 in | Wt 145.4 lb

## 2018-05-09 DIAGNOSIS — R110 Nausea: Secondary | ICD-10-CM

## 2018-05-09 DIAGNOSIS — R197 Diarrhea, unspecified: Secondary | ICD-10-CM

## 2018-05-09 DIAGNOSIS — K529 Noninfective gastroenteritis and colitis, unspecified: Secondary | ICD-10-CM | POA: Insufficient documentation

## 2018-05-09 DIAGNOSIS — E1165 Type 2 diabetes mellitus with hyperglycemia: Secondary | ICD-10-CM

## 2018-05-09 DIAGNOSIS — I10 Essential (primary) hypertension: Secondary | ICD-10-CM

## 2018-05-09 LAB — POCT GLYCOSYLATED HEMOGLOBIN (HGB A1C): Hemoglobin A1C: 7.3 % — AB (ref 4.0–5.6)

## 2018-05-09 MED ORDER — CIPROFLOXACIN HCL 500 MG PO TABS: 500.0000 mg | ORAL_TABLET | Freq: Two times a day (BID) | ORAL | 0 refills | Status: DC

## 2018-05-09 MED ORDER — DICYCLOMINE HCL 10 MG PO CAPS: 10.0000 mg | ORAL_CAPSULE | Freq: Three times a day (TID) | ORAL | 2 refills | Status: DC

## 2018-05-09 MED ORDER — ONDANSETRON HCL 4 MG PO TABS: 4.0000 mg | ORAL_TABLET | Freq: Three times a day (TID) | ORAL | 2 refills | Status: DC | PRN

## 2018-05-09 NOTE — Progress Notes (Signed)
[facility] [place], [place] [address]  Internal MEDICINE  Office Visit Note  Patient Name: Tiffany Hawkins  [id number]  [id number]  Date of Service: 05/09/2018  Chief Complaint  Patient presents with  . Medical Management of Chronic Issues    3 month follow up  . Diabetes    A1C  . Diarrhea    pt has been experiencing vomiting and diarrhea for the psat month with low grade fever it comes and goes and it happens at night time, once this episode happens the patient is stuckk in bed the next day not able to walk or eat.   . Vomiting  . Fever    The patient is here for routine follow up visit. She states that she is having trouble with intermittent vomiting and diarrhea. Happens once or twice per week. Lasts for about 24 hours then resolves on it's own. On days when this happens, she does have abdominal cramping prior to each episode of diarrhea. She has history of colon cancer. Her last colonoscopy was 02/2018. She did have diverticulosis without evidence of malignancy. Her next one is due 02/2023. Blood sugars and blood pressure is generally stable. She is otherwise feeling well.       Current Medication: Outpatient Encounter Medications as of 05/09/2018  Medication Sig Note  . ALPRAZolam (XANAX) 0.25 MG tablet Take 0.25 mg by mouth 2 (two) times daily as needed for anxiety. 01/14/2018: PRN  . amLODipine (NORVASC) 5 MG tablet Take 1 tablet (5 mg total) by mouth daily.   . benzonatate (TESSALON) 200 MG capsule Take 1 capsule (200 mg total) by mouth 3 (three) times daily as needed.   . Blood Glucose Calibration (ACCU-CHEK SMARTVIEW CONTROL VI) by In Vitro route. Use as di   . Celecoxib (CELEBREX PO) Take by mouth.   . chlorpheniramine-HYDROcodone (TUSSIONEX PENNKINETIC ER) 10-8 MG/5ML SUER Take 5 mLs by mouth at bedtime as needed.   . citalopram (CELEXA) 20 MG tablet Take 2 tablets daily for depression   . citalopram (CELEXA) 20 MG tablet Take 1 tablet (20  mg total) by mouth daily.   . fenofibrate 54 MG tablet Take 1 tablet (54 mg total) by mouth daily.   Marland Kitchen glucose blood (ACCU-CHEK SMARTVIEW) test strip Blood sugar checks TID   . ibuprofen (ADVIL,MOTRIN) 800 MG tablet Take 800 mg by mouth every 8 (eight) hours as needed.   . insulin aspart protamine - aspart (NOVOLOG MIX 70/30 FLEXPEN) (70-30) 100 UNIT/ML FlexPen Inject 0.15 mLs (15 Units total) into the skin 2 (two) times daily. 12/03/2017: 7.5 units   . lovastatin (MEVACOR) 20 MG tablet TAKE 1 TABLET AT BEDTIME FOR HIGH CHOLESTEROL   . ondansetron (ZOFRAN-ODT) 4 MG disintegrating tablet Take by mouth. 01/14/2018: PRN  . pantoprazole (PROTONIX) 20 MG tablet Take 40 mg by mouth daily.    . pantoprazole (PROTONIX) 40 MG tablet TAKE 1 TABLET EVERY DAY  FOR  REFLUX   . Semaglutide,0.25 or 0.5MG /DOS, (OZEMPIC, 0.25 OR 0.5 MG/DOSE,) 2 MG/1.5ML SOPN Inject 0.5 mg into the skin once a week.   . sharps container 1 each by Does not apply route as needed. To use for insulin syringes and needles. Taking insulin at least twice daily.  E11.65   . butalbital-acetaminophen-caffeine (FIORICET, ESGIC) 50-325-40 MG tablet Take 1 tablet by mouth every 6 (six) hours as needed for headache. (Patient not taking: Reported on 03/25/2018)   . ciprofloxacin (CIPRO) 500 MG tablet Take 1 tablet (500 mg  total) by mouth 2 (two) times daily.   Marland Kitchen dicyclomine (BENTYL) 10 MG capsule Take 1 capsule (10 mg total) by mouth 4 (four) times daily -  before meals and at bedtime.   . lidocaine-prilocaine (EMLA) cream Apply 1 application topically as needed. (Patient not taking: Reported on 03/25/2018) 01/14/2018: prn  . ondansetron (ZOFRAN) 4 MG tablet Take 1 tablet (4 mg total) by mouth every 8 (eight) hours as needed for nausea or vomiting.   . [DISCONTINUED] amoxicillin (AMOXIL) 875 MG tablet Take 1 tablet (875 mg total) by mouth 2 (two) times daily. (Patient not taking: Reported on 05/09/2018)    No facility-administered encounter medications  on file as of 05/09/2018.     Surgical History: Past Surgical History:  Procedure Laterality Date  . APPENDECTOMY    . BACK SURGERY    . CATARACT EXTRACTION W/PHACO Left 12/03/2017   Procedure: CATARACT EXTRACTION PHACO AND INTRAOCULAR LENS PLACEMENT ([place]) LEFT  DIABETIC;  Surgeon: Eulogio Bear, MD;  Location: [place];  Service: Ophthalmology;  Laterality: Left;  Diabetic - insulin  . CATARACT EXTRACTION W/PHACO Right 01/14/2018   Procedure: CATARACT EXTRACTION PHACO AND INTRAOCULAR LENS PLACEMENT ([place]) RIGHT DIABETIC;  Surgeon: Eulogio Bear, MD;  Location: [place];  Service: Ophthalmology;  Laterality: Right;  Diabetic - insulin  . CERVICAL DISC SURGERY    . CHOLECYSTECTOMY    . COLON SURGERY    . COLONOSCOPY    . COLONOSCOPY WITH PROPOFOL N/A 05/17/2015   Procedure: COLONOSCOPY WITH PROPOFOL;  Surgeon: Manya Silvas, MD;  Location[facility] ENDOSCOPY;  Service: Endoscopy;  Laterality: N/A;  . COLONOSCOPY WITH PROPOFOL N/A 04/01/2018   Procedure: COLONOSCOPY WITH BIOPSY;  Surgeon: [name], MD;  Location: [place];  Service: Endoscopy;  Laterality: N/A;  Diabetic - insulin  . ESOPHAGOGASTRODUODENOSCOPY    . HERNIA REPAIR    . POLYPECTOMY N/A 04/01/2018   Procedure: POLYPECTOMY;  Surgeon: [name], MD;  Location: [place];  Service: Endoscopy;  Laterality: N/A;  . PORT A CATH INJECTION ([place] [facility])     Port has been removed  . TUBAL LIGATION      Medical History: Past Medical History:  Diagnosis Date  . Anxiety   . Colon cancer ([place]) 10/12/2014   had chemo  . Diabetes mellitus without complication ([place])   . Hyperlipidemia   . Hypertension   . Lateral epicondylitis   . Personal history of chemotherapy   . Polio    childhood  . Sleep apnea    no CPAP, sx improved with wt loss    Family History: Family History  Problem Relation Age of Onset  . Alzheimer's disease Mother   . Heart disease Father   . Colon cancer  Sister   . Colon cancer Cousin   . Colon cancer Cousin   . Breast cancer Neg Hx     Social History   Socioeconomic History  . Marital status: Married    Spouse name: Not on file  . Number of children: Not on file  . Years of education: Not on file  . Highest education level: Not on file  Occupational History  . Not on file  Social Needs  . Financial resource strain: Not on file  . Food insecurity:    Worry: Not on file    Inability: Not on file  . Transportation needs:    Medical: Not on file    Non-medical: Not on file  Tobacco Use  .  Smoking status: Former Smoker    Packs/day: 1.00    Types: Cigarettes    Last attempt to quit: 04/17/1981    Years since quitting: 37.0  . Smokeless tobacco: Never Used  Substance and Sexual Activity  . Alcohol use: No  . Drug use: No  . Sexual activity: Never  Lifestyle  . Physical activity:    Days per week: Not on file    Minutes per session: Not on file  . Stress: Not on file  Relationships  . Social connections:    Talks on phone: Not on file    Gets together: Not on file    Attends religious service: Not on file    Active member of club or organization: Not on file    Attends meetings of clubs or organizations: Not on file    Relationship status: Not on file  . Intimate partner violence:    Fear of current or ex partner: Not on file    Emotionally abused: Not on file    Physically abused: Not on file    Forced sexual activity: Not on file  Other Topics Concern  . Not on file  Social History Narrative  . Not on file      Review of Systems  Constitutional: Negative for activity change, chills, fatigue and fever.  HENT: Negative for congestion, ear pain, postnasal drip, rhinorrhea, sinus pain, sore throat and voice change.   Respiratory: Negative for cough, chest tightness and wheezing.   Cardiovascular: Negative for chest pain and palpitations.  Gastrointestinal: Positive for diarrhea, nausea and vomiting.        Abdominal cramping. Symptoms are intermittent, occurring every few days .  Endocrine: Negative for cold intolerance, heat intolerance, polydipsia and polyuria.       Blood sugars doing well   Musculoskeletal: Positive for myalgias. Negative for arthralgias and back pain.  Skin: Negative for rash.       Single, reddened lesion on the left left leg.   Allergic/Immunologic: Negative for environmental allergies.  Neurological: Positive for dizziness and headaches.  Hematological: Negative for adenopathy.  Psychiatric/Behavioral: Positive for dysphoric mood. The patient is nervous/anxious.     Today's Vitals   05/09/18 0937  BP: (!) 154/86  Pulse: 90  Resp: 16  Temp: (!) 97.5 F (36.4 C)  SpO2: 99%  Weight: 145 lb 6.4 oz (66 kg)  Height: 5\' 2"  (1.575 m)   Body mass index is 26.59 kg/m.  Physical Exam Vitals signs and nursing note reviewed.  Constitutional:      General: She is not in acute distress.    Appearance: Normal appearance. She is well-developed. She is not diaphoretic.  HENT:     Head: Normocephalic and atraumatic.     Mouth/Throat:     Pharynx: No oropharyngeal exudate.  Eyes:     Conjunctiva/sclera: Conjunctivae normal.     Pupils: Pupils are equal, round, and reactive to light.  Neck:     Musculoskeletal: Normal range of motion and neck supple.     Thyroid: No thyromegaly.     Vascular: No JVD.     Trachea: No tracheal deviation.  Cardiovascular:     Rate and Rhythm: Normal rate and regular rhythm.     Heart sounds: Normal heart sounds. No murmur. No friction rub. No gallop.   Pulmonary:     Effort: Pulmonary effort is normal. No respiratory distress.     Breath sounds: No wheezing or rales.  Chest:     Chest  wall: No tenderness.  Abdominal:     General: Bowel sounds are normal.     Palpations: Abdomen is soft.     Tenderness: There is abdominal tenderness.     Comments: Mild,generalized tenderness.   Musculoskeletal: Normal range of motion.   Lymphadenopathy:     Cervical: No cervical adenopathy.  Skin:    General: Skin is warm and dry.  Neurological:     Mental Status: She is alert and oriented to person, place, and time. Mental status is at baseline.     Cranial Nerves: No cranial nerve deficit.     Gait: Gait abnormal.  Psychiatric:        Mood and Affect: Mood is anxious.        Behavior: Behavior normal.        Thought Content: Thought content normal.        Judgment: Judgment normal.    Assessment/Plan: 1. Uncontrolled type 2 diabetes mellitus with hyperglycemia (HCC) - POCT HgB A1C 7.3 today. Continue diabetic medication as prescribed. Recommend she follow diabetic diet more closely.   2. Gastroenteritis Start cipro 500mg  twice daily for 10 days. Increase fluid intake.  - ciprofloxacin (CIPRO) 500 MG tablet; Take 1 tablet (500 mg total) by mouth 2 (two) times daily.  Dispense: 20 tablet; Refill: 0  3. Diarrhea, unspecified type Add dicyclomine 10mg  up to three times daily as needed for abdominal cramping and diarrhea.  - dicyclomine (BENTYL) 10 MG capsule; Take 1 capsule (10 mg total) by mouth 4 (four) times daily -  before meals and at bedtime.  Dispense: 60 capsule; Refill: 2  4. Nausea zofran as needed and as prescribed. BRAT diet discussed ad recommended  - ondansetron (ZOFRAN) 4 MG tablet; Take 1 tablet (4 mg total) by mouth every 8 (eight) hours as needed for nausea or vomiting.  Dispense: 30 tablet; Refill: 2  5. Essential hypertension Stable. Continue bp medication as prescribed   General Counseling: barbar brede understanding of the findings of todays visit and agrees with plan of treatment. I have discussed any further diagnostic evaluation that may be needed or ordered today. We also reviewed her medications today. she has been encouraged to call the office with any questions or concerns that should arise related to todays visit.  The 'BRAT' diet is suggested, then progress to diet as tolerated  as symptoms abate. Call if bloody stools, persistent diarrhea, vomiting, fever or abdominal pain.  Diabetes Counseling:  1. Addition of ACE inh/ ARB'S for nephroprotection. Microalbumin is updated  2. Diabetic foot care, prevention of complications. Podiatry consult 3. Exercise and lose weight.  4. Diabetic eye examination, Diabetic eye exam is updated  5. Monitor blood sugar closlely. nutrition counseling.  6. Sign and symptoms of hypoglycemia including shaking sweating,confusion and headaches.  This patient was seen by Leretha Pol FNP Collaboration with Dr Lavera Guise as a part of collaborative care agreement  Orders Placed This Encounter  Procedures  . POCT HgB A1C    Meds ordered this encounter  Medications  . ciprofloxacin (CIPRO) 500 MG tablet    Sig: Take 1 tablet (500 mg total) by mouth 2 (two) times daily.    Dispense:  20 tablet    Refill:  0    Order Specific Question:   Supervising Provider    Answer:   Lavera Guise [[id number]]  . ondansetron (ZOFRAN) 4 MG tablet    Sig: Take 1 tablet (4 mg total) by mouth every 8 (eight) hours  as needed for nausea or vomiting.    Dispense:  30 tablet    Refill:  2    Order Specific Question:   Supervising Provider    Answer:   Lavera Guise [[id number]]  . dicyclomine (BENTYL) 10 MG capsule    Sig: Take 1 capsule (10 mg total) by mouth 4 (four) times daily -  before meals and at bedtime.    Dispense:  60 capsule    Refill:  2    Order Specific Question:   Supervising Provider    Answer:   Lavera Guise [[id number]]    Time spent: 57 Minutes      Dr Lavera Guise Internal medicine

## 2018-05-30 ENCOUNTER — Encounter: Payer: Self-pay | Admitting: Emergency Medicine

## 2018-05-30 ENCOUNTER — Ambulatory Visit
Admission: EM | Admit: 2018-05-30 | Discharge: 2018-05-30 | Disposition: A | Discharge status: Home / Self Care | Payer: Medicare HMO | Attending: Family Medicine | Admitting: Family Medicine

## 2018-05-30 ENCOUNTER — Other Ambulatory Visit: Payer: Self-pay

## 2018-05-30 DIAGNOSIS — J069 Acute upper respiratory infection, unspecified: Secondary | ICD-10-CM | POA: Diagnosis not present

## 2018-05-30 DIAGNOSIS — R197 Diarrhea, unspecified: Secondary | ICD-10-CM

## 2018-05-30 DIAGNOSIS — E119 Type 2 diabetes mellitus without complications: Secondary | ICD-10-CM

## 2018-05-30 DIAGNOSIS — Z87891 Personal history of nicotine dependence: Secondary | ICD-10-CM

## 2018-05-30 DIAGNOSIS — R112 Nausea with vomiting, unspecified: Secondary | ICD-10-CM | POA: Insufficient documentation

## 2018-05-30 LAB — GLUCOSE, CAPILLARY: Glucose-Capillary: 145 mg/dL — ABNORMAL HIGH (ref 70–99)

## 2018-05-30 MED ORDER — BENZONATATE 200 MG PO CAPS: ORAL_CAPSULE | ORAL | 0 refills | Status: DC

## 2018-05-30 MED ORDER — ONDANSETRON 8 MG PO TBDP
8.0000 mg | ORAL_TABLET | Freq: Once | ORAL | Status: AC
  Administered 2018-05-30: 8 mg via ORAL

## 2018-05-30 MED ORDER — CITALOPRAM HYDROBROMIDE 20 MG PO TABS: ORAL_TABLET | ORAL | 1 refills | Status: DC

## 2018-05-30 MED ORDER — ONDANSETRON 8 MG PO TBDP: 8.0000 mg | ORAL_TABLET | Freq: Two times a day (BID) | ORAL | 0 refills | Status: DC

## 2018-05-30 MED ORDER — ONDANSETRON HCL 4 MG/2ML IJ SOLN: 8.0000 mg | Freq: Once | INTRAMUSCULAR | Status: DC

## 2018-05-30 NOTE — Discharge Instructions (Signed)
Plenty of fluids.  Keep your urine as clear as possible.  If you worsen go to the emergency room.

## 2018-05-30 NOTE — ED Provider Notes (Signed)
[facility]    CSN: [id number] Arrival date & time: 05/30/18  1019     History   Chief Complaint Chief Complaint  Patient presents with  . Emesis    HPI AMMY LIENHARD is a 73 y.o. female.   HPI. 73 year old female accompanied by her husband presents with recurrent nausea vomiting and cough over the last 3 months that has waxed and waned.  At this time symptoms started about a week ago.  This includes the nausea vomiting and diarrhea well as malaise weakness fatigue.  Patient is type II diabetic the difficulty with control.  She states that she will have this recurrently as she is watching her grandchildren who go to childcare as well.  Not had fever or chills.  Had 5 episodes of diarrhea since yesterday.  It is watery without blood.  He does not endorse significant abdominal pain.        Past Medical History:  Diagnosis Date  . Anxiety   . Colon cancer ([place]) 10/12/2014   had chemo  . Diabetes mellitus without complication ([place])   . Hyperlipidemia   . Hypertension   . Lateral epicondylitis   . Personal history of chemotherapy   . Polio    childhood  . Sleep apnea    no CPAP, sx improved with wt loss    Patient Active Problem List   Diagnosis Date Noted  . Gastroenteritis 05/09/2018  . Diarrhea 05/09/2018  . Nausea 05/09/2018  . Personal history of colon cancer   . Polyp of sigmoid colon   . Dysuria 02/23/2018  . 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 Encounter for general adult medical examination with abnormal findings 02/10/2018  . Acute non-recurrent pansinusitis 08/24/2017  . Other headache syndrome 08/24/2017  . Uncontrolled type 2 diabetes mellitus with hyperglycemia ([place]) 08/24/2017  . Uncontrolled type 2 diabetes mellitus with hypoglycemia ([place]) 08/22/2017  . Need for vaccination against Streptococcus pneumoniae using pneumococcal conjugate vaccine 13 08/22/2017  . Essential hypertension 05/03/2017  . Otalgia,  bilateral 05/02/2017  . Type 2 diabetes mellitus with hyperglycemia ([place]) 05/02/2017  . Mixed hyperlipidemia 05/02/2017  . Right lower quadrant pain 06/29/2016  . 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 History of colon cancer 04/13/2015  . Cancer of ascending colon ([place]) 10/12/2014    Past Surgical History:  Procedure Laterality Date  . APPENDECTOMY    . BACK SURGERY    . CATARACT EXTRACTION W/PHACO Left 12/03/2017   Procedure: CATARACT EXTRACTION PHACO AND INTRAOCULAR LENS PLACEMENT ([place]) LEFT  DIABETIC;  Surgeon: Eulogio Bear, MD;  Location: [facility];  Service: Ophthalmology;  Laterality: Left;  Diabetic - insulin  . CATARACT EXTRACTION W/PHACO Right 01/14/2018   Procedure: CATARACT EXTRACTION PHACO AND INTRAOCULAR LENS PLACEMENT ([place]) RIGHT DIABETIC;  Surgeon: Eulogio Bear, MD;  Location: [place];  Service: Ophthalmology;  Laterality: Right;  Diabetic - insulin  . CERVICAL DISC SURGERY    . CHOLECYSTECTOMY    . COLON SURGERY    . COLONOSCOPY    . COLONOSCOPY WITH PROPOFOL N/A 05/17/2015   Procedure: COLONOSCOPY WITH PROPOFOL;  Surgeon: Manya Silvas, MD;  Location: [facility] ENDOSCOPY;  Service: Endoscopy;  Laterality: N/A;  . COLONOSCOPY WITH PROPOFOL N/A 04/01/2018   Procedure: COLONOSCOPY WITH BIOPSY;  Surgeon: [name], MD;  Location: [place];  Service: Endoscopy;  Laterality: N/A;  Diabetic - insulin  . ESOPHAGOGASTRODUODENOSCOPY    . HERNIA  REPAIR    . POLYPECTOMY N/A 04/01/2018   Procedure: POLYPECTOMY;  Surgeon: [name], MD;  Location: [place];  Service: Endoscopy;  Laterality: N/A;  . PORT A CATH INJECTION ([place] HX)     Port has been removed  . TUBAL LIGATION      OB History    Gravida  2   Para  2   Term  1   Preterm  1   AB      Living  2     SAB      TAB      Ectopic      Multiple      Live Births               Home Medications    Prior  to Admission medications   Medication Sig Start Date End Date Taking? Authorizing Provider  ALPRAZolam (XANAX) 0.25 MG tablet Take 0.25 mg by mouth 2 (two) times daily as needed for anxiety.    [provider]  amLODipine (NORVASC) 5 MG tablet Take 1 tablet (5 mg total) by mouth daily. 04/12/18   Ronnell Freshwater, NP  benzonatate (TESSALON) 200 MG capsule Take one cap TID PRN cough 05/30/18   Crecencio Mc P, PA-C  Blood Glucose Calibration (ACCU-CHEK SMARTVIEW CONTROL VI) by In Vitro route. Use as di    [provider]  butalbital-acetaminophen-caffeine (FIORICET, ESGIC) 50-325-40 MG tablet Take 1 tablet by mouth every 6 (six) hours as needed for headache. Patient not taking: Reported on 03/25/2018 08/24/17 08/24/18  Ronnell Freshwater, NP  Celecoxib (CELEBREX PO) Take by mouth.    [provider]  chlorpheniramine-HYDROcodone (TUSSIONEX PENNKINETIC ER) 10-8 MG/5ML SUER Take 5 mLs by mouth at bedtime as needed. 03/23/18   Norval Gable, MD  ciprofloxacin (CIPRO) 500 MG tablet Take 1 tablet (500 mg total) by mouth 2 (two) times daily. 05/09/18   Ronnell Freshwater, NP  citalopram (CELEXA) 20 MG tablet Take 1 tablet (20 mg total) by mouth daily. 03/23/18   Norval Gable, MD  citalopram (CELEXA) 20 MG tablet Take 2 tablets daily for depression 05/30/18   Ronnell Freshwater, NP  dicyclomine (BENTYL) 10 MG capsule Take 1 capsule (10 mg total) by mouth 4 (four) times daily -  before meals and at bedtime. 05/09/18   Ronnell Freshwater, NP  fenofibrate 54 MG tablet Take 1 tablet (54 mg total) by mouth daily. 08/24/17   Ronnell Freshwater, NP  glucose blood (ACCU-CHEK SMARTVIEW) test strip Blood sugar checks TID 03/26/18   Ronnell Freshwater, NP  ibuprofen (ADVIL,MOTRIN) 800 MG tablet Take 800 mg by mouth every 8 (eight) hours as needed.    [provider]  insulin aspart protamine - aspart (NOVOLOG MIX 70/30 FLEXPEN) (70-30) 100 UNIT/ML FlexPen Inject 0.15 mLs (15 Units total) into  the skin 2 (two) times daily. 05/16/17   Ronnell Freshwater, NP  lidocaine-prilocaine (EMLA) cream Apply 1 application topically as needed. Patient not taking: Reported on 03/25/2018 10/12/14   Evlyn Kanner, NP  lovastatin (MEVACOR) 20 MG tablet TAKE 1 TABLET AT BEDTIME FOR HIGH CHOLESTEROL 04/26/18   Ronnell Freshwater, NP  ondansetron (ZOFRAN ODT) 8 MG disintegrating tablet Take 1 tablet (8 mg total) by mouth 2 (two) times daily. 05/30/18   Lorin Picket, PA-C  ondansetron (ZOFRAN) 4 MG tablet Take 1 tablet (4 mg total) by mouth every 8 (eight) hours as needed for nausea or vomiting. 05/09/18  Ronnell Freshwater, NP  pantoprazole (PROTONIX) 20 MG tablet Take 40 mg by mouth daily.     [provider]  pantoprazole (PROTONIX) 40 MG tablet TAKE 1 TABLET EVERY DAY  FOR  REFLUX 04/12/18   Ronnell Freshwater, NP  Semaglutide,0.25 or 0.5MG /DOS, (OZEMPIC, 0.25 OR 0.5 MG/DOSE,) 2 MG/1.5ML SOPN Inject 0.5 mg into the skin once a week. 01/14/18   Ronnell Freshwater, NP  sharps container 1 each by Does not apply route as needed. To use for insulin syringes and needles. Taking insulin at least twice daily.  E11.65 11/06/17   Ronnell Freshwater, NP    Family History Family History  Problem Relation Age of Onset  . Alzheimer's disease Mother   . Heart disease Father   . Colon cancer Sister   . Colon cancer Cousin   . Colon cancer Cousin   . Breast cancer Neg Hx     Social History Social History   Tobacco Use  . Smoking status: Former Smoker    Packs/day: 1.00    Types: Cigarettes    Last attempt to quit: 04/17/1981    Years since quitting: 37.1  . Smokeless tobacco: Never Used  Substance Use Topics  . Alcohol use: No  . Drug use: No     Allergies   Patient has no known allergies.   Review of Systems Review of Systems  Constitutional: Positive for activity change, appetite change and fatigue. Negative for chills, diaphoresis and fever.  HENT: Positive for congestion, postnasal drip  and sinus pressure.   Respiratory: Positive for cough.   Gastrointestinal: Positive for diarrhea, nausea and vomiting.  All other systems reviewed and are negative.    Physical Exam Triage Vital Signs ED Triage Vitals  Enc Vitals Group     BP 05/30/18 1035 120/88     Pulse Rate 05/30/18 1035 95     Resp 05/30/18 1035 16     Temp 05/30/18 1035 98.8 F (37.1 C)     Temp Source 05/30/18 1035 Oral     SpO2 05/30/18 1035 97 %     Weight 05/30/18 1036 140 lb (63.5 kg)     Height 05/30/18 1036 5\' 1"  (1.549 m)     Head Circumference --      Peak Flow --      Pain Score 05/30/18 1034 0     Pain Loc --      Pain Edu? --      Excl. in [place]? --    No data found.  Updated Vital Signs BP 120/88 (BP Location: Right Arm)   Pulse 95   Temp 98.8 F (37.1 C) (Oral)   Resp 16   Ht 5\' 1"  (1.549 m)   Wt 140 lb (63.5 kg)   SpO2 97%   BMI 26.45 kg/m   Visual Acuity Right Eye Distance:   Left Eye Distance:   Bilateral Distance:    Right Eye Near:   Left Eye Near:    Bilateral Near:     Physical Exam Vitals signs and nursing note reviewed.  Constitutional:      General: She is not in acute distress.    Appearance: Normal appearance. She is not ill-appearing, toxic-appearing or diaphoretic.  HENT:     Head: Normocephalic.     Right Ear: Tympanic membrane, ear canal and external ear normal.     Left Ear: Tympanic membrane, ear canal and external ear normal.     Nose: Nose normal. No congestion  or rhinorrhea.     Mouth/Throat:     Mouth: Mucous membranes are moist.     Pharynx: No oropharyngeal exudate or posterior oropharyngeal erythema.  Eyes:     General:        Right eye: No discharge.        Left eye: No discharge.     Conjunctiva/sclera: Conjunctivae normal.  Neck:     Musculoskeletal: Normal range of motion. No muscular tenderness.  Pulmonary:     Effort: Pulmonary effort is normal.     Breath sounds: Normal breath sounds.  Abdominal:     General: Bowel sounds are  normal. There is no distension.     Palpations: Abdomen is soft.     Tenderness: There is no guarding or rebound.     Comments: Nominal tenderness is diffuse.  Musculoskeletal: Normal range of motion.  Skin:    General: Skin is warm and dry.  Neurological:     General: No focal deficit present.     Mental Status: She is alert and oriented to person, place, and time.  Psychiatric:        Mood and Affect: Mood normal.        Behavior: Behavior normal.        Thought Content: Thought content normal.        Judgment: Judgment normal.      UC Treatments / Results  Labs (all labs ordered are listed, but only abnormal results are displayed) Labs Reviewed  GLUCOSE, CAPILLARY - Abnormal; Notable for the following components:      Result Value   Glucose-Capillary 145 (*)    All other components within normal limits  CBG MONITORING, ED    EKG None  Radiology No results found.  Procedures Procedures (including critical care time)  Medications Ordered in UC Medications  ondansetron (ZOFRAN-ODT) disintegrating tablet 8 mg (8 mg Oral Given 05/30/18 1122)    Initial Impression / Assessment and Plan / UC Course  I have reviewed the triage vital signs and the nursing notes.  Pertinent labs & imaging results that were available during my care of the patient were reviewed by me and considered in my medical decision making (see chart for details).    Patient has nausea vomiting diarrhea with evidence of mild dehydration well as an upper respiratory infection.  I have given her Zofran here and she states that she was feeling better afterwards and has been able to tolerate half a bottle of Pedialyte before discharge.  Will prescribe Zofran 8 mg that she can take every 12 hours to allow her to have adequate fluid intake.  I have given her information on all of the diagnoses.  Her upper respiratory infection she will be given Tessalon Per[place] for cough suppression but otherwise needs to rest  use Tylenol or Motrin as necessary for fever chills or body aches.  If she worsens I have told her to go to the emergency room.  CBG was 145 today.  Final Clinical Impressions(s) / UC Diagnoses   Final diagnoses:  Nausea vomiting and diarrhea  Acute upper respiratory infection     Discharge Instructions     Plenty of fluids.  Keep your urine as clear as possible.  If you worsen go to the emergency room.    ED Prescriptions    Medication Sig Dispense Auth. Provider   ondansetron (ZOFRAN ODT) 8 MG disintegrating tablet Take 1 tablet (8 mg total) by mouth 2 (two) times daily. 6 tablet Lynwood Dawley,  Malachy Chamber, PA-C   benzonatate (TESSALON) 200 MG capsule Take one cap TID PRN cough 30 capsule Lorin Picket, PA-C     Controlled Substance Prescriptions [place] Controlled Substance Registry consulted? Not Applicable   Lorin Picket, PA-C 05/30/18 1154

## 2018-05-30 NOTE — ED Triage Notes (Signed)
Pt reports recurrent n/v and cough over last 3 months reports this time symptoms started about 1 week ago described and generalized weakness and fatigue, n/v and cough

## 2018-06-10 DIAGNOSIS — L905 Scar conditions and fibrosis of skin: Secondary | ICD-10-CM | POA: Diagnosis not present

## 2018-06-10 DIAGNOSIS — C44729 Squamous cell carcinoma of skin of left lower limb, including hip: Secondary | ICD-10-CM | POA: Diagnosis not present

## 2018-06-10 DIAGNOSIS — Z872 Personal history of diseases of the skin and subcutaneous tissue: Secondary | ICD-10-CM | POA: Diagnosis not present

## 2018-06-10 DIAGNOSIS — L578 Other skin changes due to chronic exposure to nonionizing radiation: Secondary | ICD-10-CM | POA: Diagnosis not present

## 2018-06-10 DIAGNOSIS — L7211 Pilar cyst: Secondary | ICD-10-CM | POA: Diagnosis not present

## 2018-06-10 DIAGNOSIS — L738 Other specified follicular disorders: Secondary | ICD-10-CM | POA: Diagnosis not present

## 2018-07-04 DIAGNOSIS — M7981 Nontraumatic hematoma of soft tissue: Secondary | ICD-10-CM | POA: Diagnosis not present

## 2018-07-16 ENCOUNTER — Telehealth: Payer: Self-pay

## 2018-07-16 NOTE — Telephone Encounter (Signed)
Pt called requesting sample for ozempic, husband will pick it up today. (given 1 sample)

## 2018-08-13 ENCOUNTER — Encounter: Payer: Self-pay | Admitting: Nurse Practitioner

## 2018-08-13 ENCOUNTER — Other Ambulatory Visit: Payer: Self-pay

## 2018-08-13 ENCOUNTER — Ambulatory Visit (INDEPENDENT_AMBULATORY_CARE_PROVIDER_SITE_OTHER): Payer: Medicare HMO | Admitting: Nurse Practitioner

## 2018-08-13 VITALS — BP 148/93 | HR 84 | Ht 62.0 in | Wt 149.0 lb

## 2018-08-13 DIAGNOSIS — R42 Dizziness and giddiness: Secondary | ICD-10-CM | POA: Insufficient documentation

## 2018-08-13 DIAGNOSIS — E1165 Type 2 diabetes mellitus with hyperglycemia: Secondary | ICD-10-CM | POA: Diagnosis not present

## 2018-08-13 DIAGNOSIS — E782 Mixed hyperlipidemia: Secondary | ICD-10-CM | POA: Diagnosis not present

## 2018-08-13 DIAGNOSIS — I10 Essential (primary) hypertension: Secondary | ICD-10-CM

## 2018-08-13 NOTE — Progress Notes (Signed)
Pt blood pressure and blood glucose elevated, informed provider.

## 2018-08-13 NOTE — Progress Notes (Signed)
[facility] [place], [place] [address]  Internal MEDICINE  Telephone Visit  Patient Name: Tiffany Hawkins  [id number]  [id number]  Date of Service: 08/13/2018  I connected with the patient at 10:38am by telephone and verified the patients identity using two identifiers.   I discussed the limitations, risks, security and privacy concerns of performing an evaluation and management service by telephone and the availability of in person appointments. I also discussed with the patient that there may be a patient responsible charge related to the service.  The patient expressed understanding and agrees to proceed.    Chief Complaint  Patient presents with  . Telephone Screen    PHONE VISIT [phone]  . Telephone Assessment  . Hypertension    blood pressure has been high for a couple fo wks, had been running over 157/104,   . Diabetes    blood glucose has been high, this morning 180  . Headache    pt experiencing off balance not sure if its due to blood pressure being elevated    The patient has been contacted via telephone for follow up visit due to concerns for spread of novel coronavirus. States that she has had increased stress in her life. As a result, her blood pressures are running around 150s over 100s. Blood sugars are running elevated. She states that friends and family are bringing food and she is cooking comfort foods for family. Has not been making the best diet choices at this time. She is taking her medications as prescribed.       Current Medication: Outpatient Encounter Medications as of 08/13/2018  Medication Sig Note  . ALPRAZolam (XANAX) 0.25 MG tablet Take 0.25 mg by mouth 2 (two) times daily as needed for anxiety. 01/14/2018: PRN  . amLODipine (NORVASC) 5 MG tablet Take 1 tablet (5 mg total) by mouth daily.   . benzonatate (TESSALON) 200 MG capsule Take one cap TID PRN cough   . Blood Glucose Calibration (ACCU-CHEK SMARTVIEW CONTROL VI)  by In Vitro route. Use as di   . Celecoxib (CELEBREX PO) Take by mouth.   . chlorpheniramine-HYDROcodone (TUSSIONEX PENNKINETIC ER) 10-8 MG/5ML SUER Take 5 mLs by mouth at bedtime as needed.   . ciprofloxacin (CIPRO) 500 MG tablet Take 1 tablet (500 mg total) by mouth 2 (two) times daily.   . citalopram (CELEXA) 20 MG tablet Take 1 tablet (20 mg total) by mouth daily.   . citalopram (CELEXA) 20 MG tablet Take 2 tablets daily for depression   . dicyclomine (BENTYL) 10 MG capsule Take 1 capsule (10 mg total) by mouth 4 (four) times daily -  before meals and at bedtime.   . fenofibrate 54 MG tablet Take 1 tablet (54 mg total) by mouth daily.   Marland Kitchen glucose blood (ACCU-CHEK SMARTVIEW) test strip Blood sugar checks TID   . ibuprofen (ADVIL,MOTRIN) 800 MG tablet Take 800 mg by mouth every 8 (eight) hours as needed.   . insulin aspart protamine - aspart (NOVOLOG MIX 70/30 FLEXPEN) (70-30) 100 UNIT/ML FlexPen Inject 0.15 mLs (15 Units total) into the skin 2 (two) times daily. 12/03/2017: 7.5 units   . lidocaine-prilocaine (EMLA) cream Apply 1 application topically as needed. 01/14/2018: prn  . lovastatin (MEVACOR) 20 MG tablet TAKE 1 TABLET AT BEDTIME FOR HIGH CHOLESTEROL   . ondansetron (ZOFRAN ODT) 8 MG disintegrating tablet Take 1 tablet (8 mg total) by mouth 2 (two) times daily.   . ondansetron (ZOFRAN) 4 MG tablet Take  1 tablet (4 mg total) by mouth every 8 (eight) hours as needed for nausea or vomiting.   . pantoprazole (PROTONIX) 20 MG tablet Take 40 mg by mouth daily.    . pantoprazole (PROTONIX) 40 MG tablet TAKE 1 TABLET EVERY DAY  FOR  REFLUX   . Semaglutide,0.25 or 0.5MG /DOS, (OZEMPIC, 0.25 OR 0.5 MG/DOSE,) 2 MG/1.5ML SOPN Inject 0.5 mg into the skin once a week.   . sharps container 1 each by Does not apply route as needed. To use for insulin syringes and needles. Taking insulin at least twice daily.  E11.65   . butalbital-acetaminophen-caffeine (FIORICET, ESGIC) 50-325-40 MG tablet Take 1 tablet  by mouth every 6 (six) hours as needed for headache. (Patient not taking: Reported on 03/25/2018)    No facility-administered encounter medications on file as of 08/13/2018.     Surgical History: Past Surgical History:  Procedure Laterality Date  . APPENDECTOMY    . BACK SURGERY    . CATARACT EXTRACTION W/PHACO Left 12/03/2017   Procedure: CATARACT EXTRACTION PHACO AND INTRAOCULAR LENS PLACEMENT ([place]) LEFT  DIABETIC;  Surgeon: Eulogio Bear, MD;  Location: [place];  Service: Ophthalmology;  Laterality: Left;  Diabetic - insulin  . CATARACT EXTRACTION W/PHACO Right 01/14/2018   Procedure: CATARACT EXTRACTION PHACO AND INTRAOCULAR LENS PLACEMENT ([place]) RIGHT DIABETIC;  Surgeon: Eulogio Bear, MD;  Location: [place];  Service: Ophthalmology;  Laterality: Right;  Diabetic - insulin  . CERVICAL DISC SURGERY    . CHOLECYSTECTOMY    . COLON SURGERY    . COLONOSCOPY    . COLONOSCOPY WITH PROPOFOL N/A 05/17/2015   Procedure: COLONOSCOPY WITH PROPOFOL;  Surgeon: Manya Silvas, MD;  Location: [facility] ENDOSCOPY;  Service: Endoscopy;  Laterality: N/A;  . COLONOSCOPY WITH PROPOFOL N/A 04/01/2018   Procedure: COLONOSCOPY WITH BIOPSY;  Surgeon: [name], MD;  Location: [facility];  Service: Endoscopy;  Laterality: N/A;  Diabetic - insulin  . ESOPHAGOGASTRODUODENOSCOPY    . HERNIA REPAIR    . POLYPECTOMY N/A 04/01/2018   Procedure: POLYPECTOMY;  Surgeon: [name], MD;  Location: [facility];  Service: Endoscopy;  Laterality: N/A;  . PORT A CATH INJECTION ([place] HX)     Port has been removed  . TUBAL LIGATION      Medical History: Past Medical History:  Diagnosis Date  . Anxiety   . Colon cancer ([place]) 10/12/2014   had chemo  . Diabetes mellitus without complication ([place])   . Hyperlipidemia   . Hypertension   . Lateral epicondylitis   . Personal history of chemotherapy   . Polio    childhood  . Sleep apnea    no CPAP, sx improved with wt  loss    Family History: Family History  Problem Relation Age of Onset  . Alzheimer's disease Mother   . Heart disease Father   . Colon cancer Sister   . Colon cancer Cousin   . Colon cancer Cousin   . Breast cancer Neg Hx     Social History   Socioeconomic History  . Marital status: Married    Spouse name: Not on file  . Number of children: Not on file  . Years of education: Not on file  . Highest education level: Not on file  Occupational History  . Not on file  Social Needs  . Financial resource strain: Not on file  . Food insecurity:    Worry: Not on file    Inability: Not on file  .  Transportation needs:    Medical: Not on file    Non-medical: Not on file  Tobacco Use  . Smoking status: Former Smoker    Packs/day: 1.00    Types: Cigarettes    Last attempt to quit: 04/17/1981    Years since quitting: 37.3  . Smokeless tobacco: Never Used  Substance and Sexual Activity  . Alcohol use: No  . Drug use: No  . Sexual activity: Never  Lifestyle  . Physical activity:    Days per week: Not on file    Minutes per session: Not on file  . Stress: Not on file  Relationships  . Social connections:    Talks on phone: Not on file    Gets together: Not on file    Attends religious service: Not on file    Active member of club or organization: Not on file    Attends meetings of clubs or organizations: Not on file    Relationship status: Not on file  . Intimate partner violence:    Fear of current or ex partner: Not on file    Emotionally abused: Not on file    Physically abused: Not on file    Forced sexual activity: Not on file  Other Topics Concern  . Not on file  Social History Narrative  . Not on file      Review of Systems  Constitutional: Negative for activity change, chills, fatigue and fever.  HENT: Negative for congestion, ear pain, postnasal drip, rhinorrhea, sinus pain, sore throat and voice change.   Respiratory: Negative for cough, chest tightness  and wheezing.   Cardiovascular: Negative for chest pain and palpitations.       Blood pressure elevated recently   Gastrointestinal: Negative for nausea and vomiting.  Endocrine:       Elevated blood sugars past few weeks.   Musculoskeletal: Negative for arthralgias, back pain and myalgias.  Skin: Negative for rash.       Single, reddened lesion on the left left leg.   Allergic/Immunologic: Negative for environmental allergies.  Neurological: Positive for dizziness and headaches.  Hematological: Negative for adenopathy.  Psychiatric/Behavioral: Negative for dysphoric mood. The patient is nervous/anxious.     Today's Vitals   08/13/18 1019  BP: (!) 148/93  Pulse: 84  Weight: 149 lb (67.6 kg)  Height: 5\' 2"  (1.575 m)   Body mass index is 27.25 kg/m.  Observation/Objective:  The patient is alert and oriented. She does sound worried and stressed. No labored breathing is appreciated. No acute distress at this time.    Assessment/Plan: 1. Uncontrolled type 2 diabetes mellitus with hyperglycemia (HCC) Blood sugars running elevated recently, likely due to increased family stress. No changes made today. Will check HgbA1c at next in-office visit and adjust dosing as indicated.   2. Essential hypertension Mildly elevated today, but generally well controlled. No changes made to meds today. Continue to monitor closely.   3. Mixed hyperlipidemia Continue lovastatin. May consider replacing with crestor at next visit.   4. Dizziness Chronic problem for patient. Continue regular visits with neurology as scheduled.   General Counseling: malissia rabbani understanding of the findings of today's phone visit and agrees with plan of treatment. I have discussed any further diagnostic evaluation that may be needed or ordered today. We also reviewed her medications today. she has been encouraged to call the office with any questions or concerns that should arise related to todays  visit.  Diabetes Counseling:  1. Addition of ACE inh/  ARB'S for nephroprotection. Microalbumin is updated  2. Diabetic foot care, prevention of complications. Podiatry consult 3. Exercise and lose weight.  4. Diabetic eye examination, Diabetic eye exam is updated  5. Monitor blood sugar closlely. nutrition counseling.  6. Sign and symptoms of hypoglycemia including shaking sweating,confusion and headaches.  Hypertension Counseling:   The following hypertensive lifestyle modification were recommended and discussed:  1. Limiting alcohol intake to less than 1 oz/day of ethanol:(24 oz of beer or 8 oz of wine or 2 oz of 100-proof whiskey). 2. Take baby ASA 81 mg daily. 3. Importance of regular aerobic exercise and losing weight. 4. Reduce dietary saturated fat and cholesterol intake for overall cardiovascular health. 5. Maintaining adequate dietary potassium, calcium, and magnesium intake. 6. Regular monitoring of the blood pressure. 7. Reduce sodium intake to less than 100 mmol/day (less than 2.3 gm of sodium or less than 6 gm of sodium choride)   This patient was seen by [place] with Dr Lavera Guise as a part of collaborative care agreement   Time spent: 25 Minutes    Dr Lavera Guise Internal medicine

## 2018-08-20 ENCOUNTER — Encounter: Payer: Self-pay | Admitting: Nurse Practitioner

## 2018-08-20 ENCOUNTER — Other Ambulatory Visit: Payer: Self-pay

## 2018-08-20 ENCOUNTER — Ambulatory Visit (INDEPENDENT_AMBULATORY_CARE_PROVIDER_SITE_OTHER): Payer: Medicare HMO | Admitting: Nurse Practitioner

## 2018-08-20 VITALS — BP 149/87 | HR 92 | Temp 98.7°F | Resp 16 | Ht 62.0 in | Wt 149.0 lb

## 2018-08-20 DIAGNOSIS — I10 Essential (primary) hypertension: Secondary | ICD-10-CM | POA: Diagnosis not present

## 2018-08-20 DIAGNOSIS — E782 Mixed hyperlipidemia: Secondary | ICD-10-CM

## 2018-08-20 DIAGNOSIS — R42 Dizziness and giddiness: Secondary | ICD-10-CM

## 2018-08-20 DIAGNOSIS — E1165 Type 2 diabetes mellitus with hyperglycemia: Secondary | ICD-10-CM | POA: Diagnosis not present

## 2018-08-20 LAB — POCT GLYCOSYLATED HEMOGLOBIN (HGB A1C): Hemoglobin A1C: 7.3 % — AB (ref 4.0–5.6)

## 2018-08-20 MED ORDER — FENOFIBRATE 54 MG PO TABS: 54.0000 mg | ORAL_TABLET | Freq: Every day | ORAL | 3 refills | Status: DC

## 2018-08-20 MED ORDER — LIRAGLUTIDE 18 MG/3ML ~~LOC~~ SOPN: 1.8000 mg | PEN_INJECTOR | Freq: Every day | SUBCUTANEOUS | 3 refills | Status: DC

## 2018-08-20 NOTE — Progress Notes (Signed)
[facility] [place], [place] [address]  Internal MEDICINE  Office Visit Note  Patient Name: Tiffany Hawkins  [id number]  [id number]  Date of Service: 08/20/2018   Pt is here for a sick visit.  Chief Complaint  Patient presents with  . Diabetes    blood sugars elevated, pt mentioned spouse recently diagnoses with pneumonia  . Hypertension    blood pressure elevated  . Dizziness    wobbly     The patient is here for sick visit. She states that she is feeling very fatigued. States that her blood sugars are running between 170 and 200 recently. Thought it was related to stress, however, stress has passed and blood sugars remain elevated. She is feeling more dizzy recently, off balance. Blood pressure has been elevated as well. Spoke to her pharmacist about diabetic medication and feels like victoza worked better for her than the [place]. Pharmacist told her that victoza is much more affordable at this point, as she is no longer in or close to the donut hole.        Current Medication:  Outpatient Encounter Medications as of 08/20/2018  Medication Sig Note  . ALPRAZolam (XANAX) 0.25 MG tablet Take 0.25 mg by mouth 2 (two) times daily as needed for anxiety. 01/14/2018: PRN  . amLODipine (NORVASC) 5 MG tablet Take 1 tablet (5 mg total) by mouth daily.   . benzonatate (TESSALON) 200 MG capsule Take one cap TID PRN cough   . Blood Glucose Calibration (ACCU-CHEK SMARTVIEW CONTROL VI) by In Vitro route. Use as di   . butalbital-acetaminophen-caffeine (FIORICET, ESGIC) 50-325-40 MG tablet Take 1 tablet by mouth every 6 (six) hours as needed for headache.   . Celecoxib (CELEBREX PO) Take by mouth.   . chlorpheniramine-HYDROcodone (TUSSIONEX PENNKINETIC ER) 10-8 MG/5ML SUER Take 5 mLs by mouth at bedtime as needed.   . citalopram (CELEXA) 20 MG tablet Take 1 tablet (20 mg total) by mouth daily.   Marland Kitchen dicyclomine (BENTYL) 10 MG capsule Take 1 capsule (10 mg total) by  mouth 4 (four) times daily -  before meals and at bedtime.   . fenofibrate 54 MG tablet Take 1 tablet (54 mg total) by mouth daily.   Marland Kitchen glucose blood (ACCU-CHEK SMARTVIEW) test strip Blood sugar checks TID   . ibuprofen (ADVIL,MOTRIN) 800 MG tablet Take 800 mg by mouth every 8 (eight) hours as needed.   . insulin aspart protamine - aspart (NOVOLOG MIX 70/30 FLEXPEN) (70-30) 100 UNIT/ML FlexPen Inject 0.15 mLs (15 Units total) into the skin 2 (two) times daily. 12/03/2017: 7.5 units   . lidocaine-prilocaine (EMLA) cream Apply 1 application topically as needed. 01/14/2018: prn  . lovastatin (MEVACOR) 20 MG tablet TAKE 1 TABLET AT BEDTIME FOR HIGH CHOLESTEROL   . ondansetron (ZOFRAN ODT) 8 MG disintegrating tablet Take 1 tablet (8 mg total) by mouth 2 (two) times daily.   . ondansetron (ZOFRAN) 4 MG tablet Take 1 tablet (4 mg total) by mouth every 8 (eight) hours as needed for nausea or vomiting.   . pantoprazole (PROTONIX) 20 MG tablet Take 40 mg by mouth daily.    . pantoprazole (PROTONIX) 40 MG tablet TAKE 1 TABLET EVERY DAY  FOR  REFLUX   . sharps container 1 each by Does not apply route as needed. To use for insulin syringes and needles. Taking insulin at least twice daily.  E11.65   . [DISCONTINUED] ciprofloxacin (CIPRO) 500 MG tablet Take 1 tablet (500 mg total)  by mouth 2 (two) times daily.   . [DISCONTINUED] citalopram (CELEXA) 20 MG tablet Take 2 tablets daily for depression   . [DISCONTINUED] fenofibrate 54 MG tablet Take 1 tablet (54 mg total) by mouth daily.   . [DISCONTINUED] Semaglutide,0.25 or 0.5MG /DOS, (OZEMPIC, 0.25 OR 0.5 MG/DOSE,) 2 MG/1.5ML SOPN Inject 0.5 mg into the skin once a week. 08/20/2018: go back to victoza  . liraglutide (VICTOZA) 18 MG/3ML SOPN Inject 0.3 mLs (1.8 mg total) into the skin daily.    No facility-administered encounter medications on file as of 08/20/2018.       Medical History: Past Medical History:  Diagnosis Date  . Anxiety   . Colon cancer ([place])  10/12/2014   had chemo  . Diabetes mellitus without complication ([place])   . Hyperlipidemia   . Hypertension   . Lateral epicondylitis   . Personal history of chemotherapy   . Polio    childhood  . Sleep apnea    no CPAP, sx improved with wt loss    Today's Vitals   08/20/18 1417  BP: (!) 149/87  Pulse: 92  Resp: 16  Temp: 98.7 F (37.1 C)  SpO2: 97%  Weight: 149 lb (67.6 kg)  Height: 5\' 2"  (1.575 m)   Body mass index is 27.25 kg/m.  Review of Systems  Constitutional: Positive for fatigue. Negative for activity change, chills and fever.  HENT: Negative for congestion, ear pain, postnasal drip, rhinorrhea, sinus pain, sore throat and voice change.   Respiratory: Negative for cough, chest tightness and wheezing.   Cardiovascular: Negative for chest pain and palpitations.       Blood pressure elevated recently   Gastrointestinal: Negative for nausea and vomiting.  Endocrine: Negative for cold intolerance, heat intolerance, polydipsia and polyuria.       Elevated blood sugars past few weeks.   Musculoskeletal: Negative for arthralgias, back pain and myalgias.  Skin: Negative for rash.  Allergic/Immunologic: Negative for environmental allergies.  Neurological: Positive for dizziness and headaches.  Hematological: Negative for adenopathy.  Psychiatric/Behavioral: Negative for dysphoric mood. The patient is nervous/anxious.     Physical Exam Vitals signs and nursing note reviewed.  Constitutional:      General: She is not in acute distress.    Appearance: Normal appearance. She is well-developed. She is not diaphoretic.  HENT:     Head: Normocephalic and atraumatic.     Mouth/Throat:     Pharynx: No oropharyngeal exudate.  Eyes:     Conjunctiva/sclera: Conjunctivae normal.     Pupils: Pupils are equal, round, and reactive to light.  Neck:     Musculoskeletal: Normal range of motion and neck supple.     Thyroid: No thyromegaly.     Vascular: No carotid bruit or JVD.      Trachea: No tracheal deviation.  Cardiovascular:     Rate and Rhythm: Normal rate and regular rhythm.     Heart sounds: Normal heart sounds. No murmur. No friction rub. No gallop.   Pulmonary:     Effort: Pulmonary effort is normal. No respiratory distress.     Breath sounds: Normal breath sounds. No wheezing or rales.  Chest:     Chest wall: No tenderness.  Abdominal:     General: Bowel sounds are normal.     Palpations: Abdomen is soft.     Comments: Mild,generalized tenderness.   Musculoskeletal: Normal range of motion.  Lymphadenopathy:     Cervical: No cervical adenopathy.  Skin:    General: Skin  is warm and dry.  Neurological:     Mental Status: She is alert and oriented to person, place, and time. Mental status is at baseline.     Cranial Nerves: No cranial nerve deficit.     Gait: Gait abnormal.     Comments: Pupils are equal, round, and reactive to light and accomodation. Cranial nerves are intact. Strength is mildly weak but equal, bilaterally.   Psychiatric:        Mood and Affect: Mood is anxious.        Behavior: Behavior normal.        Thought Content: Thought content normal.        Judgment: Judgment normal.    Assessment/Plan: 1. Uncontrolled type 2 diabetes mellitus with hyperglycemia (HCC) - POCT HgB A1C 7.3 today. D/c ozempic and restart victoza 1.8mg  daily. Continue novolin R 15 units twice daily. Continue to monitor blood sugars closely and bring log to next visit . - liraglutide (VICTOZA) 18 MG/3ML SOPN; Inject 0.3 mLs (1.8 mg total) into the skin daily.  Dispense: 9 mL; Refill: 3  2. Mixed hyperlipidemia - fenofibrate 54 MG tablet; Take 1 tablet (54 mg total) by mouth daily.  Dispense: 90 tablet; Refill: 3  3. Dizziness Quick neurological exam within normal limits. Increased dizziness possibly from elevated sugars. Will monitor closely.   4. Essential hypertension Blood pressures are stable. Continue bp medication as prescribed   General  Counseling: luvinia lucy understanding of the findings of todays visit and agrees with plan of treatment. I have discussed any further diagnostic evaluation that may be needed or ordered today. We also reviewed her medications today. she has been encouraged to call the office with any questions or concerns that should arise related to todays visit.    Counseling:  Diabetes Counseling:  1. Addition of ACE inh/ ARB'S for nephroprotection. Microalbumin is updated  2. Diabetic foot care, prevention of complications. Podiatry consult 3. Exercise and lose weight.  4. Diabetic eye examination, Diabetic eye exam is updated  5. Monitor blood sugar closlely. nutrition counseling.  6. Sign and symptoms of hypoglycemia including shaking sweating,confusion and headaches.  This patient was seen by Leretha Pol FNP Collaboration with Dr Lavera Guise as a part of collaborative care agreement  Orders Placed This Encounter  Procedures  . POCT HgB A1C    Meds ordered this encounter  Medications  . fenofibrate 54 MG tablet    Sig: Take 1 tablet (54 mg total) by mouth daily.    Dispense:  90 tablet    Refill:  3    Order Specific Question:   Supervising Provider    Answer:   Lavera Guise [[id number]]  . liraglutide (VICTOZA) 18 MG/3ML SOPN    Sig: Inject 0.3 mLs (1.8 mg total) into the skin daily.    Dispense:  9 mL    Refill:  3    D/c ozempic    Order Specific Question:   Supervising Provider    Answer:   Lavera Guise [[id number]]    Time spent: 30 Minutes

## 2018-08-20 NOTE — Progress Notes (Signed)
Pt blood pressure elevated, informed provider. 

## 2018-08-20 NOTE — Telephone Encounter (Signed)
Can we give her an app

## 2018-09-30 ENCOUNTER — Ambulatory Visit (INDEPENDENT_AMBULATORY_CARE_PROVIDER_SITE_OTHER): Payer: Medicare HMO | Admitting: Nurse Practitioner

## 2018-09-30 ENCOUNTER — Encounter: Payer: Self-pay | Admitting: Nurse Practitioner

## 2018-09-30 ENCOUNTER — Other Ambulatory Visit: Payer: Self-pay

## 2018-09-30 VITALS — BP 155/89 | HR 96 | Resp 16 | Ht 62.0 in | Wt 149.0 lb

## 2018-09-30 DIAGNOSIS — I10 Essential (primary) hypertension: Secondary | ICD-10-CM | POA: Diagnosis not present

## 2018-09-30 DIAGNOSIS — R42 Dizziness and giddiness: Secondary | ICD-10-CM

## 2018-09-30 DIAGNOSIS — E1165 Type 2 diabetes mellitus with hyperglycemia: Secondary | ICD-10-CM | POA: Diagnosis not present

## 2018-09-30 LAB — POCT GLYCOSYLATED HEMOGLOBIN (HGB A1C): Hemoglobin A1C: 6.9 % — AB (ref 4.0–5.6)

## 2018-09-30 MED ORDER — AMLODIPINE BESYLATE 5 MG PO TABS: 10.0000 mg | ORAL_TABLET | Freq: Every day | ORAL | 1 refills | Status: DC

## 2018-09-30 NOTE — Progress Notes (Signed)
[facility] [place], [place] [address]  Internal MEDICINE  Office Visit Note  Patient Name: Tiffany Hawkins  [id number]  [id number]  Date of Service: 09/30/2018  Chief Complaint  Patient presents with  . Medical Management of Chronic Issues    6wk follow up, pt stated that her balance is getting worse  . Diabetes    A1C    The patient is here for routine follow up. Blood pressures have been running slightly elevated. generally 283 to 151 systolic and 90 diastolic. Blood pressure is elevated today. She states that she is feeling very tired.  Has intermittent episodes of nausea with vomiting and diarrhea. This is made worse by certain foods. States that this has been issue her entire life. She does have history of colon cancer. Her last colonoscopy was 03/2018 and overall was good. Repeat is due in 2024. She has not noted blood in the stool       Current Medication: Outpatient Encounter Medications as of 09/30/2018  Medication Sig Note  . ALPRAZolam (XANAX) 0.25 MG tablet Take 0.25 mg by mouth 2 (two) times daily as needed for anxiety. 01/14/2018: PRN  . amLODipine (NORVASC) 5 MG tablet Take 2 tablets (10 mg total) by mouth daily.   . benzonatate (TESSALON) 200 MG capsule Take one cap TID PRN cough   . Blood Glucose Calibration (ACCU-CHEK SMARTVIEW CONTROL VI) by In Vitro route. Use as di   . Celecoxib (CELEBREX PO) Take by mouth.   . chlorpheniramine-HYDROcodone (TUSSIONEX PENNKINETIC ER) 10-8 MG/5ML SUER Take 5 mLs by mouth at bedtime as needed.   . citalopram (CELEXA) 20 MG tablet Take 1 tablet (20 mg total) by mouth daily.   Marland Kitchen dicyclomine (BENTYL) 10 MG capsule Take 1 capsule (10 mg total) by mouth 4 (four) times daily -  before meals and at bedtime.   . fenofibrate 54 MG tablet Take 1 tablet (54 mg total) by mouth daily.   Marland Kitchen glucose blood (ACCU-CHEK SMARTVIEW) test strip Blood sugar checks TID   . ibuprofen (ADVIL,MOTRIN) 800 MG tablet Take 800 mg by  mouth every 8 (eight) hours as needed.   . insulin aspart protamine - aspart (NOVOLOG MIX 70/30 FLEXPEN) (70-30) 100 UNIT/ML FlexPen Inject 0.15 mLs (15 Units total) into the skin 2 (two) times daily. 12/03/2017: 7.5 units   . lidocaine-prilocaine (EMLA) cream Apply 1 application topically as needed. 01/14/2018: prn  . liraglutide (VICTOZA) 18 MG/3ML SOPN Inject 0.3 mLs (1.8 mg total) into the skin daily.   Marland Kitchen lovastatin (MEVACOR) 20 MG tablet TAKE 1 TABLET AT BEDTIME FOR HIGH CHOLESTEROL   . ondansetron (ZOFRAN ODT) 8 MG disintegrating tablet Take 1 tablet (8 mg total) by mouth 2 (two) times daily.   . ondansetron (ZOFRAN) 4 MG tablet Take 1 tablet (4 mg total) by mouth every 8 (eight) hours as needed for nausea or vomiting.   . pantoprazole (PROTONIX) 20 MG tablet Take 40 mg by mouth daily.    . pantoprazole (PROTONIX) 40 MG tablet TAKE 1 TABLET EVERY DAY  FOR  REFLUX   . sharps container 1 each by Does not apply route as needed. To use for insulin syringes and needles. Taking insulin at least twice daily.  E11.65   . [DISCONTINUED] amLODipine (NORVASC) 5 MG tablet Take 1 tablet (5 mg total) by mouth daily.    No facility-administered encounter medications on file as of 09/30/2018.     Surgical History: Past Surgical History:  Procedure Laterality Date  .  APPENDECTOMY    . BACK SURGERY    . CATARACT EXTRACTION W/PHACO Left 12/03/2017   Procedure: CATARACT EXTRACTION PHACO AND INTRAOCULAR LENS PLACEMENT ([place]) LEFT  DIABETIC;  Surgeon: Eulogio Bear, MD;  Location: [facility];  Service: Ophthalmology;  Laterality: Left;  Diabetic - insulin  . CATARACT EXTRACTION W/PHACO Right 01/14/2018   Procedure: CATARACT EXTRACTION PHACO AND INTRAOCULAR LENS PLACEMENT ([place]) RIGHT DIABETIC;  Surgeon: Eulogio Bear, MD;  Location: [facility];  Service: Ophthalmology;  Laterality: Right;  Diabetic - insulin  . CERVICAL DISC SURGERY    . CHOLECYSTECTOMY    . COLON SURGERY    .  COLONOSCOPY    . COLONOSCOPY WITH PROPOFOL N/A 05/17/2015   Procedure: COLONOSCOPY WITH PROPOFOL;  Surgeon: Manya Silvas, MD;  Location: [facility] ENDOSCOPY;  Service: Endoscopy;  Laterality: N/A;  . COLONOSCOPY WITH PROPOFOL N/A 04/01/2018   Procedure: COLONOSCOPY WITH BIOPSY;  Surgeon: [name], MD;  Location: [facility];  Service: Endoscopy;  Laterality: N/A;  Diabetic - insulin  . ESOPHAGOGASTRODUODENOSCOPY    . HERNIA REPAIR    . POLYPECTOMY N/A 04/01/2018   Procedure: POLYPECTOMY;  Surgeon: [name], MD;  Location: [facility];  Service: Endoscopy;  Laterality: N/A;  . PORT A CATH INJECTION ([place] [facility])     Port has been removed  . TUBAL LIGATION      Medical History: Past Medical History:  Diagnosis Date  . Anxiety   . Colon cancer ([place]) 10/12/2014   had chemo  . Diabetes mellitus without complication ([place])   . Hyperlipidemia   . Hypertension   . Lateral epicondylitis   . Personal history of chemotherapy   . Polio    childhood  . Sleep apnea    no CPAP, sx improved with wt loss    Family History: Family History  Problem Relation Age of Onset  . Alzheimer's disease Mother   . Heart disease Father   . Colon cancer Sister   . Colon cancer Cousin   . Colon cancer Cousin   . Breast cancer Neg Hx     Social History   Socioeconomic History  . Marital status: Married    Spouse name: Not on file  . Number of children: Not on file  . Years of education: Not on file  . Highest education level: Not on file  Occupational History  . Not on file  Social Needs  . Financial resource strain: Not on file  . Food insecurity    Worry: Not on file    Inability: Not on file  . Transportation needs    Medical: Not on file    Non-medical: Not on file  Tobacco Use  . Smoking status: Former Smoker    Packs/day: 1.00    Types: Cigarettes    Quit date: 04/17/1981    Years since quitting: 37.4  . Smokeless tobacco: Never Used  Substance and Sexual Activity   . Alcohol use: No  . Drug use: No  . Sexual activity: Never  Lifestyle  . Physical activity    Days per week: Not on file    Minutes per session: Not on file  . Stress: Not on file  Relationships  . Social Herbalist on phone: Not on file    Gets together: Not on file    Attends religious service: Not on file    Active member of club or organization: Not on file    Attends meetings of  clubs or organizations: Not on file    Relationship status: Not on file  . Intimate partner violence    Fear of current or ex partner: Not on file    Emotionally abused: Not on file    Physically abused: Not on file    Forced sexual activity: Not on file  Other Topics Concern  . Not on file  Social History Narrative  . Not on file      Review of Systems  Constitutional: Positive for fatigue. Negative for activity change, chills and fever.  HENT: Negative for congestion, ear pain, postnasal drip, rhinorrhea, sinus pain, sore throat and voice change.   Respiratory: Negative for cough, chest tightness and wheezing.   Cardiovascular: Negative for chest pain and palpitations.       Blood pressure elevated recently generally running 614 and 431 systolic and 90 diastolic.   Gastrointestinal: Positive for constipation, diarrhea and vomiting. Negative for blood in stool and nausea.  Endocrine: Negative for cold intolerance, heat intolerance, polydipsia and polyuria.       Elevated blood sugars past few weeks.   Musculoskeletal: Negative for arthralgias, back pain and myalgias.  Skin: Negative for rash.  Allergic/Immunologic: Negative for environmental allergies.  Neurological: Positive for dizziness and headaches.  Hematological: Negative for adenopathy.  Psychiatric/Behavioral: Negative for dysphoric mood. The patient is nervous/anxious.    Today's Vitals   09/30/18 1059  BP: (!) 155/89  Pulse: 96  Resp: 16  SpO2: 96%  Weight: 149 lb (67.6 kg)  Height: 5\' 2"  (1.575 m)   Body  mass index is 27.25 kg/m.  Physical Exam Vitals signs and nursing note reviewed.  Constitutional:      General: She is not in acute distress.    Appearance: Normal appearance. She is well-developed. She is not diaphoretic.  HENT:     Head: Normocephalic and atraumatic.     Mouth/Throat:     Pharynx: No oropharyngeal exudate.  Eyes:     Conjunctiva/sclera: Conjunctivae normal.     Pupils: Pupils are equal, round, and reactive to light.  Neck:     Musculoskeletal: Normal range of motion and neck supple.     Thyroid: No thyromegaly.     Vascular: No carotid bruit or JVD.     Trachea: No tracheal deviation.  Cardiovascular:     Rate and Rhythm: Normal rate and regular rhythm.     Heart sounds: Normal heart sounds. No murmur. No friction rub. No gallop.   Pulmonary:     Effort: Pulmonary effort is normal. No respiratory distress.     Breath sounds: Normal breath sounds. No wheezing or rales.  Chest:     Chest wall: No tenderness.  Abdominal:     General: Bowel sounds are normal.     Palpations: Abdomen is soft.     Tenderness: There is abdominal tenderness.     Comments: Mild, left lower quadrant tenderness.   Musculoskeletal: Normal range of motion.  Lymphadenopathy:     Cervical: No cervical adenopathy.  Skin:    General: Skin is warm and dry.  Neurological:     Mental Status: She is alert and oriented to person, place, and time. Mental status is at baseline.     Cranial Nerves: No cranial nerve deficit.     Gait: Gait abnormal.     Comments: Pupils are equal, round, and reactive to light and accomodation. Cranial nerves are intact. Strength is mildly weak but equal, bilaterally.   Psychiatric:  Mood and Affect: Mood is anxious.        Behavior: Behavior normal.        Thought Content: Thought content normal.        Judgment: Judgment normal.   Assessment/Plan: 1. Uncontrolled type 2 diabetes mellitus with hyperglycemia (HCC) - POCT HgB A1C 6.9 today. Continue  diabetic medications as prescribed. Continue to monitor closely.   2. Essential hypertension Increase amlodipine 5mg  to two tablets every day. Limit intake of salt in the diet and increase water intake.  - amLODipine (NORVASC) 5 MG tablet; Take 2 tablets (10 mg total) by mouth daily.  Dispense: 180 tablet; Refill: 1  3. Dizziness Stable. Consider PT program to help with balance and dizziness. Will monitor.   General Counseling: fritzie prioleau understanding of the findings of todays visit and agrees with plan of treatment. I have discussed any further diagnostic evaluation that may be needed or ordered today. We also reviewed her medications today. she has been encouraged to call the office with any questions or concerns that should arise related to todays visit.  Hypertension Counseling:   The following hypertensive lifestyle modification were recommended and discussed:  1. Limiting alcohol intake to less than 1 oz/day of ethanol:(24 oz of beer or 8 oz of wine or 2 oz of 100-proof whiskey). 2. Take baby ASA 81 mg daily. 3. Importance of regular aerobic exercise and losing weight. 4. Reduce dietary saturated fat and cholesterol intake for overall cardiovascular health. 5. Maintaining adequate dietary potassium, calcium, and magnesium intake. 6. Regular monitoring of the blood pressure. 7. Reduce sodium intake to less than 100 mmol/day (less than 2.3 gm of sodium or less than 6 gm of sodium choride)   This patient was seen by [place] with Dr Lavera Guise as a part of collaborative care agreement  Orders Placed This Encounter  Procedures  . POCT HgB A1C    Meds ordered this encounter  Medications  . amLODipine (NORVASC) 5 MG tablet    Sig: Take 2 tablets (10 mg total) by mouth daily.    Dispense:  180 tablet    Refill:  1    Order Specific Question:   Supervising Provider    Answer:   Lavera Guise [[id number]]    Time spent: 5 Minutes      Dr Lavera Guise Internal medicine

## 2018-10-03 ENCOUNTER — Other Ambulatory Visit: Payer: Self-pay | Admitting: Nurse Practitioner

## 2018-10-03 DIAGNOSIS — D509 Iron deficiency anemia, unspecified: Secondary | ICD-10-CM | POA: Diagnosis not present

## 2018-10-03 DIAGNOSIS — R5383 Other fatigue: Secondary | ICD-10-CM | POA: Diagnosis not present

## 2018-10-03 DIAGNOSIS — E538 Deficiency of other specified B group vitamins: Secondary | ICD-10-CM | POA: Diagnosis not present

## 2018-10-03 DIAGNOSIS — E119 Type 2 diabetes mellitus without complications: Secondary | ICD-10-CM | POA: Diagnosis not present

## 2018-10-04 LAB — CBC WITH DIFFERENTIAL/PLATELET
Basophils Absolute: 0 10*3/uL (ref 0.0–0.2)
Basos: 1 %
EOS (ABSOLUTE): 0.2 10*3/uL (ref 0.0–0.4)
Eos: 3 %
Hematocrit: 46.2 % (ref 34.0–46.6)
Hemoglobin: 15.5 g/dL (ref 11.1–15.9)
Immature Grans (Abs): 0.1 10*3/uL (ref 0.0–0.1)
Immature Granulocytes: 1 %
Lymphocytes Absolute: 2.3 10*3/uL (ref 0.7–3.1)
Lymphs: 27 %
MCH: 28.9 pg (ref 26.6–33.0)
MCHC: 33.5 g/dL (ref 31.5–35.7)
MCV: 86 fL (ref 79–97)
Monocytes Absolute: 0.8 10*3/uL (ref 0.1–0.9)
Monocytes: 10 %
Neutrophils Absolute: 5.1 10*3/uL (ref 1.4–7.0)
Neutrophils: 58 %
Platelets: 233 10*3/uL (ref 150–450)
RBC: 5.37 x10E6/uL — ABNORMAL HIGH (ref 3.77–5.28)
RDW: 13.2 % (ref 11.7–15.4)
WBC: 8.6 10*3/uL (ref 3.4–10.8)

## 2018-10-04 LAB — TSH+FREE T4
Free T4: 1.07 ng/dL (ref 0.82–1.77)
TSH: 2.31 u[IU]/mL (ref 0.450–4.500)

## 2018-10-04 LAB — COMPREHENSIVE METABOLIC PANEL
ALT: 33 IU/L — ABNORMAL HIGH (ref 0–32)
AST: 25 IU/L (ref 0–40)
Albumin/Globulin Ratio: 1.8 (ref 1.2–2.2)
Albumin: 4.5 g/dL (ref 3.7–4.7)
Alkaline Phosphatase: 64 IU/L (ref 39–117)
BUN/Creatinine Ratio: 16 (ref 12–28)
BUN: 20 mg/dL (ref 8–27)
Bilirubin Total: 0.3 mg/dL (ref 0.0–1.2)
CO2: 24 mmol/L (ref 20–29)
Calcium: 9.7 mg/dL (ref 8.7–10.3)
Chloride: 103 mmol/L (ref 96–106)
Creatinine, Ser: 1.25 mg/dL — ABNORMAL HIGH (ref 0.57–1.00)
GFR calc Af Amer: 49 mL/min/{1.73_m2} — ABNORMAL LOW (ref >59)
GFR calc non Af Amer: 43 mL/min/{1.73_m2} — ABNORMAL LOW (ref >59)
Globulin, Total: 2.5 g/dL (ref 1.5–4.5)
Glucose: 164 mg/dL — ABNORMAL HIGH (ref 65–99)
Potassium: 4.1 mmol/L (ref 3.5–5.2)
Sodium: 143 mmol/L (ref 134–144)
Total Protein: 7 g/dL (ref 6.0–8.5)

## 2018-10-04 LAB — VITAMIN B12: Vitamin B-12: 303 pg/mL (ref 232–1245)

## 2018-10-04 LAB — FERRITIN: Ferritin: 377 ng/mL — ABNORMAL HIGH (ref 15–150)

## 2018-10-04 LAB — SPECIMEN STATUS REPORT

## 2018-10-10 ENCOUNTER — Other Ambulatory Visit: Payer: Self-pay

## 2018-10-10 DIAGNOSIS — E782 Mixed hyperlipidemia: Secondary | ICD-10-CM

## 2018-10-10 MED ORDER — FENOFIBRATE 54 MG PO TABS: 54.0000 mg | ORAL_TABLET | Freq: Every day | ORAL | 3 refills | Status: DC

## 2018-10-17 DIAGNOSIS — Z859 Personal history of malignant neoplasm, unspecified: Secondary | ICD-10-CM | POA: Diagnosis not present

## 2018-10-17 DIAGNOSIS — Z872 Personal history of diseases of the skin and subcutaneous tissue: Secondary | ICD-10-CM | POA: Diagnosis not present

## 2018-10-17 DIAGNOSIS — L578 Other skin changes due to chronic exposure to nonionizing radiation: Secondary | ICD-10-CM | POA: Diagnosis not present

## 2018-10-17 DIAGNOSIS — L821 Other seborrheic keratosis: Secondary | ICD-10-CM | POA: Diagnosis not present

## 2018-10-17 DIAGNOSIS — L570 Actinic keratosis: Secondary | ICD-10-CM | POA: Diagnosis not present

## 2018-10-22 DIAGNOSIS — E119 Type 2 diabetes mellitus without complications: Secondary | ICD-10-CM | POA: Diagnosis not present

## 2018-10-28 ENCOUNTER — Other Ambulatory Visit: Payer: Self-pay

## 2018-10-28 DIAGNOSIS — E1165 Type 2 diabetes mellitus with hyperglycemia: Secondary | ICD-10-CM

## 2018-10-28 MED ORDER — ACCU-CHEK SMARTVIEW VI STRP: ORAL_STRIP | 5 refills | Status: DC

## 2018-10-31 ENCOUNTER — Other Ambulatory Visit: Payer: Self-pay

## 2018-10-31 ENCOUNTER — Ambulatory Visit (INDEPENDENT_AMBULATORY_CARE_PROVIDER_SITE_OTHER): Payer: Medicare HMO | Admitting: Nurse Practitioner

## 2018-10-31 ENCOUNTER — Encounter: Payer: Self-pay | Admitting: Nurse Practitioner

## 2018-10-31 VITALS — BP 143/87 | HR 88 | Temp 97.6°F | Resp 16 | Ht 62.0 in | Wt 151.2 lb

## 2018-10-31 DIAGNOSIS — R7989 Other specified abnormal findings of blood chemistry: Secondary | ICD-10-CM | POA: Insufficient documentation

## 2018-10-31 DIAGNOSIS — F321 Major depressive disorder, single episode, moderate: Secondary | ICD-10-CM | POA: Insufficient documentation

## 2018-10-31 DIAGNOSIS — E1165 Type 2 diabetes mellitus with hyperglycemia: Secondary | ICD-10-CM | POA: Diagnosis not present

## 2018-10-31 DIAGNOSIS — I10 Essential (primary) hypertension: Secondary | ICD-10-CM | POA: Diagnosis not present

## 2018-10-31 DIAGNOSIS — K760 Fatty (change of) liver, not elsewhere classified: Secondary | ICD-10-CM | POA: Diagnosis not present

## 2018-10-31 MED ORDER — ESCITALOPRAM OXALATE 10 MG PO TABS: 10.0000 mg | ORAL_TABLET | Freq: Every day | ORAL | 3 refills | Status: DC

## 2018-10-31 MED ORDER — LISINOPRIL 5 MG PO TABS: 5.0000 mg | ORAL_TABLET | Freq: Every day | ORAL | 3 refills | Status: DC

## 2018-10-31 MED ORDER — NOVOLOG MIX 70/30 FLEXPEN (70-30) 100 UNIT/ML ~~LOC~~ SUPN: 18.0000 [IU] | PEN_INJECTOR | Freq: Two times a day (BID) | SUBCUTANEOUS | 11 refills | Status: DC

## 2018-10-31 NOTE — Progress Notes (Signed)
Pt blood pressure elevated,  She took it at home this morning it was BP 130/90 P 93  Has been keeping a log of it, yesterday  BP 151/101 P 106 Informed provider.

## 2018-10-31 NOTE — Progress Notes (Signed)
[facility] [place],  [id number]  Internal MEDICINE  Office Visit Note  Patient Name: Tiffany Hawkins  [id number]  [id number]  Date of Service: 10/31/2018  Chief Complaint  Patient presents with  . Medical Management of Chronic Issues    4wk follow up, increased amlodipine  . Hypertension    pt concerned about blood pressure still running high, noticed that she is off balance  . Labs Only    review labs    The patient is here for follow up visit. Increased amlodipine to 10mg  daily. Blood pressure Is improved. Diastolic pressure and pulse are still elevated. She has brought blood sugar log which shows that her blood sugars are also increased over the last few weeks. She states that she feels tired often and is losing he balance more often. She used to see Dr. Manuella Ghazi, neurologist for this in the past. Was taking medication for this in the past. She has not seen him for some time. Wasn't sure that seeing him was doing much good for her. Her last MRI of the brain was done 08/2017 and showed chronic atrophy and some white matte abnormalities. It was stable from prior exams.       Current Medication: Outpatient Encounter Medications as of 10/31/2018  Medication Sig Note  . ALPRAZolam (XANAX) 0.25 MG tablet Take 0.25 mg by mouth 2 (two) times daily as needed for anxiety. 01/14/2018: PRN  . amLODipine (NORVASC) 5 MG tablet Take 2 tablets (10 mg total) by mouth daily.   . benzonatate (TESSALON) 200 MG capsule Take one cap TID PRN cough   . Blood Glucose Calibration (ACCU-CHEK SMARTVIEW CONTROL VI) by In Vitro route. Use as di   . Celecoxib (CELEBREX PO) Take by mouth.   . chlorpheniramine-HYDROcodone (TUSSIONEX PENNKINETIC ER) 10-8 MG/5ML SUER Take 5 mLs by mouth at bedtime as needed.   . dicyclomine (BENTYL) 10 MG capsule Take 1 capsule (10 mg total) by mouth 4 (four) times daily -  before meals and at bedtime.   . fenofibrate 54 MG tablet Take 1 tablet (54 mg  total) by mouth daily.   Marland Kitchen glucose blood (ACCU-CHEK SMARTVIEW) test strip Blood sugar checks TID   . ibuprofen (ADVIL,MOTRIN) 800 MG tablet Take 800 mg by mouth every 8 (eight) hours as needed.   . insulin aspart protamine - aspart (NOVOLOG MIX 70/30 FLEXPEN) (70-30) 100 UNIT/ML FlexPen Inject 0.18 mLs (18 Units total) into the skin 2 (two) times daily.   Marland Kitchen lidocaine-prilocaine (EMLA) cream Apply 1 application topically as needed. 01/14/2018: prn  . liraglutide (VICTOZA) 18 MG/3ML SOPN Inject 0.3 mLs (1.8 mg total) into the skin daily.   Marland Kitchen lovastatin (MEVACOR) 20 MG tablet TAKE 1 TABLET AT BEDTIME FOR HIGH CHOLESTEROL   . ondansetron (ZOFRAN ODT) 8 MG disintegrating tablet Take 1 tablet (8 mg total) by mouth 2 (two) times daily.   . ondansetron (ZOFRAN) 4 MG tablet Take 1 tablet (4 mg total) by mouth every 8 (eight) hours as needed for nausea or vomiting.   . pantoprazole (PROTONIX) 20 MG tablet Take 40 mg by mouth daily.    . pantoprazole (PROTONIX) 40 MG tablet TAKE 1 TABLET EVERY DAY  FOR  REFLUX   . sharps container 1 each by Does not apply route as needed. To use for insulin syringes and needles. Taking insulin at least twice daily.  E11.65   . [DISCONTINUED] citalopram (CELEXA) 20 MG tablet Take 1 tablet (20 mg total) by mouth  daily. 10/31/2018: change to escitalopram  . [DISCONTINUED] insulin aspart protamine - aspart (NOVOLOG MIX 70/30 FLEXPEN) (70-30) 100 UNIT/ML FlexPen Inject 0.15 mLs (15 Units total) into the skin 2 (two) times daily. 12/03/2017: 7.5 units   . escitalopram (LEXAPRO) 10 MG tablet Take 1 tablet (10 mg total) by mouth daily.   Marland Kitchen lisinopril (ZESTRIL) 5 MG tablet Take 1 tablet (5 mg total) by mouth daily.    No facility-administered encounter medications on file as of 10/31/2018.     Surgical History: Past Surgical History:  Procedure Laterality Date  . APPENDECTOMY    . BACK SURGERY    . CATARACT EXTRACTION W/PHACO Left 12/03/2017   Procedure: CATARACT EXTRACTION PHACO  AND INTRAOCULAR LENS PLACEMENT ([place]) LEFT  DIABETIC;  Surgeon: Eulogio Bear, MD;  Location: [place];  Service: Ophthalmology;  Laterality: Left;  Diabetic - insulin  . CATARACT EXTRACTION W/PHACO Right 01/14/2018   Procedure: CATARACT EXTRACTION PHACO AND INTRAOCULAR LENS PLACEMENT ([place]) RIGHT DIABETIC;  Surgeon: Eulogio Bear, MD;  Location: [facility];  Service: Ophthalmology;  Laterality: Right;  Diabetic - insulin  . CERVICAL DISC SURGERY    . CHOLECYSTECTOMY    . COLON SURGERY    . COLONOSCOPY    . COLONOSCOPY WITH PROPOFOL N/A 05/17/2015   Procedure: COLONOSCOPY WITH PROPOFOL;  Surgeon: Manya Silvas, MD;  Location: [facility] ENDOSCOPY;  Service: Endoscopy;  Laterality: N/A;  . COLONOSCOPY WITH PROPOFOL N/A 04/01/2018   Procedure: COLONOSCOPY WITH BIOPSY;  Surgeon: [name], MD;  Location: [facility];  Service: Endoscopy;  Laterality: N/A;  Diabetic - insulin  . ESOPHAGOGASTRODUODENOSCOPY    . HERNIA REPAIR    . POLYPECTOMY N/A 04/01/2018   Procedure: POLYPECTOMY;  Surgeon: [name], MD;  Location: [place];  Service: Endoscopy;  Laterality: N/A;  . PORT A CATH INJECTION ([place] [facility])     Port has been removed  . TUBAL LIGATION      Medical History: Past Medical History:  Diagnosis Date  . Anxiety   . Colon cancer ([place]) 10/12/2014   had chemo  . Diabetes mellitus without complication ([place])   . Hyperlipidemia   . Hypertension   . Lateral epicondylitis   . Personal history of chemotherapy   . Polio    childhood  . Sleep apnea    no CPAP, sx improved with wt loss    Family History: Family History  Problem Relation Age of Onset  . Alzheimer's disease Mother   . Heart disease Father   . Colon cancer Sister   . Colon cancer Cousin   . Colon cancer Cousin   . Breast cancer Neg Hx     Social History   Socioeconomic History  . Marital status: Married    Spouse name: Not on file  . Number of children: Not on file  .  Years of education: Not on file  . Highest education level: Not on file  Occupational History  . Not on file  Social Needs  . Financial resource strain: Not on file  . Food insecurity    Worry: Not on file    Inability: Not on file  . Transportation needs    Medical: Not on file    Non-medical: Not on file  Tobacco Use  . Smoking status: Former Smoker    Packs/day: 1.00    Types: Cigarettes    Quit date: 04/17/1981    Years since quitting: 37.5  . Smokeless tobacco: Never Used  Substance  and Sexual Activity  . Alcohol use: No  . Drug use: No  . Sexual activity: Never  Lifestyle  . Physical activity    Days per week: Not on file    Minutes per session: Not on file  . Stress: Not on file  Relationships  . Social Herbalist on phone: Not on file    Gets together: Not on file    Attends religious service: Not on file    Active member of club or organization: Not on file    Attends meetings of clubs or organizations: Not on file    Relationship status: Not on file  . Intimate partner violence    Fear of current or ex partner: Not on file    Emotionally abused: Not on file    Physically abused: Not on file    Forced sexual activity: Not on file  Other Topics Concern  . Not on file  Social History Narrative  . Not on file      Review of Systems  Constitutional: Positive for fatigue. Negative for activity change, chills and fever.  HENT: Negative for congestion, ear pain, postnasal drip, rhinorrhea, sinus pain, sore throat and voice change.   Respiratory: Negative for cough, chest tightness and wheezing.   Cardiovascular: Negative for chest pain and palpitations.       Blood pressure elevated recently generally running 454 and 098 systolic and 90 diastolic.   Gastrointestinal: Positive for constipation, diarrhea, nausea and vomiting. Negative for blood in stool.  Endocrine: Negative for cold intolerance, heat intolerance, polydipsia and polyuria.        Elevated blood sugars past few weeks.   Musculoskeletal: Negative for arthralgias, back pain and myalgias.  Skin: Negative for rash.  Allergic/Immunologic: Negative for environmental allergies.  Neurological: Positive for dizziness, weakness and headaches.  Hematological: Negative for adenopathy.  Psychiatric/Behavioral: Negative for dysphoric mood. The patient is nervous/anxious.     Today's Vitals   10/31/18 1152  BP: (!) 143/87  Pulse: 88  Resp: 16  Temp: 97.6 F (36.4 C)  SpO2: 97%  Weight: 151 lb 3.2 oz (68.6 kg)  Height: 5\' 2"  (1.575 m)   Body mass index is 27.65 kg/m.  Physical Exam Vitals signs and nursing note reviewed.  Constitutional:      General: She is not in acute distress.    Appearance: Normal appearance. She is well-developed. She is not diaphoretic.  HENT:     Head: Normocephalic and atraumatic.     Mouth/Throat:     Pharynx: No oropharyngeal exudate.  Eyes:     Conjunctiva/sclera: Conjunctivae normal.     Pupils: Pupils are equal, round, and reactive to light.  Neck:     Musculoskeletal: Normal range of motion and neck supple.     Thyroid: No thyromegaly.     Vascular: No carotid bruit or JVD.     Trachea: No tracheal deviation.  Cardiovascular:     Rate and Rhythm: Normal rate and regular rhythm.     Heart sounds: Normal heart sounds. No murmur. No friction rub. No gallop.   Pulmonary:     Effort: Pulmonary effort is normal. No respiratory distress.     Breath sounds: Normal breath sounds. No wheezing or rales.  Chest:     Chest wall: No tenderness.  Abdominal:     General: Bowel sounds are normal.     Palpations: Abdomen is soft.     Tenderness: There is abdominal tenderness.  Comments: Mild, left lower quadrant tenderness.   Musculoskeletal: Normal range of motion.  Lymphadenopathy:     Cervical: No cervical adenopathy.  Skin:    General: Skin is warm and dry.  Neurological:     Mental Status: She is alert and oriented to person,  place, and time. Mental status is at baseline.     Cranial Nerves: No cranial nerve deficit.     Gait: Gait abnormal.     Comments: Pupils are equal, round, and reactive to light and accomodation. Cranial nerves are intact. Strength is mildly weak but equal, bilaterally.   Psychiatric:        Mood and Affect: Mood is anxious.        Behavior: Behavior normal.        Thought Content: Thought content normal.        Judgment: Judgment normal.    Assessment/Plan: 1. Essential hypertension Add lisinopril 5mg  daily. Continue with amlodipine at current dose.  - lisinopril (ZESTRIL) 5 MG tablet; Take 1 tablet (5 mg total) by mouth daily.  Dispense: 30 tablet; Refill: 3  2. Uncontrolled type 2 diabetes mellitus with hyperglycemia (HCC) Increase humalog 70/30 mix to 18 units twice daily. Continue other diabetic medication as prescribed  - insulin aspart protamine - aspart (NOVOLOG MIX 70/30 FLEXPEN) (70-30) 100 UNIT/ML FlexPen; Inject 0.18 mLs (18 Units total) into the skin 2 (two) times daily.  Dispense: 15 mL; Refill: 11  3. Hepatic steatosis Abdominal ultrasound for further evaluation.  - US Abdomen Complete; Future  4. Elevated ferritin Abdominal ultrasound ordered. Repeat labs prior to next visit.   5. 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HCC) Change citalopram to lexapro 10mg  daily. Written instructions for stopping citalopram provided. Patient voiced understanding.  - escitalopram (LEXAPRO) 10 MG tablet; Take 1 tablet (10 mg total) by mouth daily.  Dispense: 30 tablet; Refill: 3  General Counseling: Abygail verbalizes understanding of the findings of todays visit and agrees with plan of treatment. I have discussed any further diagnostic evaluation that may be needed or ordered today. We also reviewed her medications today. she has been encouraged to call the office with any questions or concerns that should arise related to todays visit.  Hypertension Counseling:   The following  hypertensive lifestyle modification were recommended and discussed:  1. Limiting alcohol intake to less than 1 oz/day of ethanol:(24 oz of beer or 8 oz of wine or 2 oz of 100-proof whiskey). 2. Take baby ASA 81 mg daily. 3. Importance of regular aerobic exercise and losing weight. 4. Reduce dietary saturated fat and cholesterol intake for overall cardiovascular health. 5. Maintaining adequate dietary potassium, calcium, and magnesium intake. 6. Regular monitoring of the blood pressure. 7. Reduce sodium intake to less than 100 mmol/day (less than 2.3 gm of sodium or less than 6 gm of sodium choride)   Diabetes Counseling:  1. Addition of ACE inh/ ARB'S for nephroprotection. Microalbumin is updated  2. Diabetic foot care, prevention of complications. Podiatry consult 3. Exercise and lose weight.  4. Diabetic eye examination, Diabetic eye exam is updated  5. Monitor blood sugar closlely. nutrition counseling.  6. Sign and symptoms of hypoglycemia including shaking sweating,confusion and headaches.  This patient was seen by Leretha Pol FNP Collaboration with Dr Lavera Guise as a part of collaborative care agreement  Orders Placed This Encounter  Procedures  . US Abdomen Complete    Meds ordered this encounter  Medications  . lisinopril (ZESTRIL) 5 MG tablet  Sig: Take 1 tablet (5 mg total) by mouth daily.    Dispense:  30 tablet    Refill:  3    Order Specific Question:   Supervising Provider    Answer:   Lavera Guise [[id number]]  . escitalopram (LEXAPRO) 10 MG tablet    Sig: Take 1 tablet (10 mg total) by mouth daily.    Dispense:  30 tablet    Refill:  3    Patient given instructions to wean down citalopram to off and start escitalopram.    Order Specific Question:   Supervising Provider    Answer:   Lavera Guise [place]  . insulin aspart protamine - aspart (NOVOLOG MIX 70/30 FLEXPEN) (70-30) 100 UNIT/ML FlexPen    Sig: Inject 0.18 mLs (18 Units total) into the skin 2 (two)  times daily.    Dispense:  15 mL    Refill:  11    Increase dose    Order Specific Question:   Supervising Provider    Answer:   Lavera Guise [[id number]]    Time spent: 39 Minutes      Dr Lavera Guise Internal medicine

## 2018-11-05 DIAGNOSIS — L820 Inflamed seborrheic keratosis: Secondary | ICD-10-CM | POA: Diagnosis not present

## 2018-11-05 DIAGNOSIS — C44729 Squamous cell carcinoma of skin of left lower limb, including hip: Secondary | ICD-10-CM | POA: Diagnosis not present

## 2018-11-05 DIAGNOSIS — L570 Actinic keratosis: Secondary | ICD-10-CM | POA: Diagnosis not present

## 2018-11-08 ENCOUNTER — Ambulatory Visit: Payer: Medicare HMO

## 2018-11-08 ENCOUNTER — Other Ambulatory Visit: Payer: Self-pay

## 2018-11-08 DIAGNOSIS — K760 Fatty (change of) liver, not elsewhere classified: Secondary | ICD-10-CM | POA: Diagnosis not present

## 2018-11-11 ENCOUNTER — Other Ambulatory Visit: Payer: Self-pay | Admitting: Nurse Practitioner

## 2018-11-11 DIAGNOSIS — R7989 Other specified abnormal findings of blood chemistry: Secondary | ICD-10-CM | POA: Diagnosis not present

## 2018-11-11 DIAGNOSIS — R944 Abnormal results of kidney function studies: Secondary | ICD-10-CM | POA: Diagnosis not present

## 2018-11-12 LAB — COMPREHENSIVE METABOLIC PANEL
ALT: 21 IU/L (ref 0–32)
AST: 15 IU/L (ref 0–40)
Albumin/Globulin Ratio: 2 (ref 1.2–2.2)
Albumin: 4.5 g/dL (ref 3.7–4.7)
Alkaline Phosphatase: 51 IU/L (ref 39–117)
BUN/Creatinine Ratio: 17 (ref 12–28)
BUN: 23 mg/dL (ref 8–27)
Bilirubin Total: 0.3 mg/dL (ref 0.0–1.2)
CO2: 24 mmol/L (ref 20–29)
Calcium: 9.3 mg/dL (ref 8.7–10.3)
Chloride: 103 mmol/L (ref 96–106)
Creatinine, Ser: 1.34 mg/dL — ABNORMAL HIGH (ref 0.57–1.00)
GFR calc Af Amer: 45 mL/min/{1.73_m2} — ABNORMAL LOW (ref >59)
GFR calc non Af Amer: 39 mL/min/{1.73_m2} — ABNORMAL LOW (ref >59)
Globulin, Total: 2.2 g/dL (ref 1.5–4.5)
Glucose: 128 mg/dL — ABNORMAL HIGH (ref 65–99)
Potassium: 4.2 mmol/L (ref 3.5–5.2)
Sodium: 142 mmol/L (ref 134–144)
Total Protein: 6.7 g/dL (ref 6.0–8.5)

## 2018-11-12 LAB — CBC
Hematocrit: 43.2 % (ref 34.0–46.6)
Hemoglobin: 14.7 g/dL (ref 11.1–15.9)
MCH: 28.7 pg (ref 26.6–33.0)
MCHC: 34 g/dL (ref 31.5–35.7)
MCV: 84 fL (ref 79–97)
Platelets: 224 10*3/uL (ref 150–450)
RBC: 5.12 x10E6/uL (ref 3.77–5.28)
RDW: 12.8 % (ref 11.7–15.4)
WBC: 6.4 10*3/uL (ref 3.4–10.8)

## 2018-11-12 LAB — FERRITIN: Ferritin: 423 ng/mL — ABNORMAL HIGH (ref 15–150)

## 2018-11-15 ENCOUNTER — Other Ambulatory Visit: Payer: Self-pay

## 2018-11-15 ENCOUNTER — Encounter: Payer: Self-pay | Admitting: Nurse Practitioner

## 2018-11-15 ENCOUNTER — Ambulatory Visit (INDEPENDENT_AMBULATORY_CARE_PROVIDER_SITE_OTHER): Payer: Medicare HMO | Admitting: Nurse Practitioner

## 2018-11-15 VITALS — BP 126/70 | HR 85 | Resp 16 | Ht 62.0 in | Wt 145.6 lb

## 2018-11-15 DIAGNOSIS — R269 Unspecified abnormalities of gait and mobility: Secondary | ICD-10-CM | POA: Diagnosis not present

## 2018-11-15 DIAGNOSIS — I10 Essential (primary) hypertension: Secondary | ICD-10-CM

## 2018-11-15 DIAGNOSIS — N281 Cyst of kidney, acquired: Secondary | ICD-10-CM | POA: Diagnosis not present

## 2018-11-15 DIAGNOSIS — K760 Fatty (change of) liver, not elsewhere classified: Secondary | ICD-10-CM

## 2018-11-15 DIAGNOSIS — E1165 Type 2 diabetes mellitus with hyperglycemia: Secondary | ICD-10-CM | POA: Diagnosis not present

## 2018-11-15 NOTE — Progress Notes (Signed)
[facility] [place], [place] [address]  Internal MEDICINE  Office Visit Note  Patient Name: Tiffany Hawkins  [id number]  [id number]  Date of Service: 11/27/2018  Chief Complaint  Patient presents with  . Follow-up    [place] results  . Hypertension  . Hyperlipidemia  . Anxiety  . Arthritis  . Quality Metric Gaps    PNA vac low risk adult    The patient is here for follow up visit. She had abdominal ultrasound due to elevated renal functions on recent routine, fasting labs. She had also been having generalized abdominal tenderness. Ultrasound showed fatty infiltration of the liver as well as bilateral renal cysts, both measuring about 3.0cm in diameter. Blood pressure is well controlled. She continues to have trouble with balance. Does not necessarily feel dizzy, just unable to balance properly. She has seen neurology for this in the past. Had been taking medication for vertigo, but not helping. 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 There is possible link between this and balance issue. Unsure if this has been addressed In the past.       Current Medication: Outpatient Encounter Medications as of 11/15/2018  Medication Sig Note  . ALPRAZolam (XANAX) 0.25 MG tablet Take 0.25 mg by mouth 2 (two) times daily as needed for anxiety. 01/14/2018: PRN  . amLODipine (NORVASC) 5 MG tablet Take 2 tablets (10 mg total) by mouth daily.   . benzonatate (TESSALON) 200 MG capsule Take one cap TID PRN cough   . Blood Glucose Calibration (ACCU-CHEK SMARTVIEW CONTROL VI) by In Vitro route. Use as di   . Celecoxib (CELEBREX PO) Take by mouth.   . chlorpheniramine-HYDROcodone (TUSSIONEX PENNKINETIC ER) 10-8 MG/5ML SUER Take 5 mLs by mouth at bedtime as needed.   . dicyclomine (BENTYL) 10 MG capsule Take 1 capsule (10 mg total) by mouth 4 (four) times daily -  before meals and at bedtime.   Marland Kitchen escitalopram  (LEXAPRO) 10 MG tablet Take 1 tablet (10 mg total) by mouth daily.   . fenofibrate 54 MG tablet Take 1 tablet (54 mg total) by mouth daily.   Marland Kitchen glucose blood (ACCU-CHEK SMARTVIEW) test strip Blood sugar checks TID   . ibuprofen (ADVIL,MOTRIN) 800 MG tablet Take 800 mg by mouth every 8 (eight) hours as needed.   . insulin aspart protamine - aspart (NOVOLOG MIX 70/30 FLEXPEN) (70-30) 100 UNIT/ML FlexPen Inject 0.18 mLs (18 Units total) into the skin 2 (two) times daily.   Marland Kitchen lidocaine-prilocaine (EMLA) cream Apply 1 application topically as needed. 01/14/2018: prn  . lisinopril (ZESTRIL) 5 MG tablet Take 1 tablet (5 mg total) by mouth daily.   Marland Kitchen lovastatin (MEVACOR) 20 MG tablet TAKE 1 TABLET AT BEDTIME FOR HIGH CHOLESTEROL   . ondansetron (ZOFRAN ODT) 8 MG disintegrating tablet Take 1 tablet (8 mg total) by mouth 2 (two) times daily.   . ondansetron (ZOFRAN) 4 MG tablet Take 1 tablet (4 mg total) by mouth every 8 (eight) hours as needed for nausea or vomiting.   . pantoprazole (PROTONIX) 20 MG tablet Take 40 mg by mouth daily.    . pantoprazole (PROTONIX) 40 MG tablet TAKE 1 TABLET EVERY DAY  FOR  REFLUX   . sharps container 1 each by Does not apply route as needed. To use for insulin syringes and needles. Taking insulin at least twice daily.  E11.65   . [DISCONTINUED] liraglutide (  VICTOZA) 18 MG/3ML SOPN Inject 0.3 mLs (1.8 mg total) into the skin daily.    No facility-administered encounter medications on file as of 11/15/2018.     Surgical History: Past Surgical History:  Procedure Laterality Date  . APPENDECTOMY    . BACK SURGERY    . CATARACT EXTRACTION W/PHACO Left 12/03/2017   Procedure: CATARACT EXTRACTION PHACO AND INTRAOCULAR LENS PLACEMENT ([place]) LEFT  DIABETIC;  Surgeon: Eulogio Bear, MD;  Location: [facility];  Service: Ophthalmology;  Laterality: Left;  Diabetic - insulin  . CATARACT EXTRACTION W/PHACO Right 01/14/2018   Procedure: CATARACT EXTRACTION PHACO AND  INTRAOCULAR LENS PLACEMENT ([place]) RIGHT DIABETIC;  Surgeon: Eulogio Bear, MD;  Location: [facility];  Service: Ophthalmology;  Laterality: Right;  Diabetic - insulin  . CERVICAL DISC SURGERY    . CHOLECYSTECTOMY    . COLON SURGERY    . COLONOSCOPY    . COLONOSCOPY WITH PROPOFOL N/A 05/17/2015   Procedure: COLONOSCOPY WITH PROPOFOL;  Surgeon: Manya Silvas, MD;  Location: [facility] ENDOSCOPY;  Service: Endoscopy;  Laterality: N/A;  . COLONOSCOPY WITH PROPOFOL N/A 04/01/2018   Procedure: COLONOSCOPY WITH BIOPSY;  Surgeon: [name], MD;  Location: [facility];  Service: Endoscopy;  Laterality: N/A;  Diabetic - insulin  . ESOPHAGOGASTRODUODENOSCOPY    . HERNIA REPAIR    . POLYPECTOMY N/A 04/01/2018   Procedure: POLYPECTOMY;  Surgeon: [name], MD;  Location: [facility];  Service: Endoscopy;  Laterality: N/A;  . PORT A CATH INJECTION ([place] HX)     Port has been removed  . TUBAL LIGATION      Medical History: Past Medical History:  Diagnosis Date  . Anxiety   . Colon cancer ([place]) 10/12/2014   had chemo  . Diabetes mellitus without complication ([place])   . Hyperlipidemia   . Hypertension   . Lateral epicondylitis   . Personal history of chemotherapy   . Polio    childhood  . Sleep apnea    no CPAP, sx improved with wt loss    Family History: Family History  Problem Relation Age of Onset  . Alzheimer's disease Mother   . Heart disease Father   . Colon cancer Sister   . Colon cancer Cousin   . Colon cancer Cousin   . Breast cancer Neg Hx     Social History   Socioeconomic History  . Marital status: Married    Spouse name: Not on file  . Number of children: Not on file  . Years of education: Not on file  . Highest education level: Not on file  Occupational History  . Not on file  Social Needs  . Financial resource strain: Not on file  . Food insecurity    Worry: Not on file    Inability: Not on file  . Transportation needs     Medical: Not on file    Non-medical: Not on file  Tobacco Use  . Smoking status: Former Smoker    Packs/day: 1.00    Types: Cigarettes    Quit date: 04/17/1981    Years since quitting: 37.6  . Smokeless tobacco: Never Used  Substance and Sexual Activity  . Alcohol use: No  . Drug use: No  . Sexual activity: Never  Lifestyle  . Physical activity    Days per week: Not on file    Minutes per session: Not on file  . Stress: Not on file  Relationships  . Social connections  Talks on phone: Not on file    Gets together: Not on file    Attends religious service: Not on file    Active member of club or organization: Not on file    Attends meetings of clubs or organizations: Not on file    Relationship status: Not on file  . Intimate partner violence    Fear of current or ex partner: Not on file    Emotionally abused: Not on file    Physically abused: Not on file    Forced sexual activity: Not on file  Other Topics Concern  . Not on file  Social History Narrative  . Not on file      Review of Systems  Constitutional: Positive for fatigue. Negative for activity change, chills and fever.  HENT: Negative for congestion, ear pain, postnasal drip, rhinorrhea, sinus pain, sore throat and voice change.   Respiratory: Negative for cough, chest tightness and wheezing.   Cardiovascular: Negative for chest pain and palpitations.       Blood pressure elevated recently generally running 154 and 008 systolic and 90 diastolic.   Gastrointestinal: Positive for constipation, diarrhea and nausea. Negative for blood in stool and vomiting.  Endocrine: Negative for cold intolerance, heat intolerance, polydipsia and polyuria.       Blood sugars doing ok.  Musculoskeletal: Positive for gait problem and myalgias. Negative for arthralgias and back pain.       Left leg weakness.   Skin: Negative for rash.  Allergic/Immunologic: Negative for environmental allergies.  Neurological: Positive for  weakness and headaches.  Hematological: Negative for adenopathy.  Psychiatric/Behavioral: Negative for dysphoric mood. The patient is nervous/anxious.     Today's Vitals   11/15/18 1544  BP: 126/70  Pulse: 85  Resp: 16  SpO2: 94%  Weight: 145 lb 9.6 oz (66 kg)  Height: 5\' 2"  (1.575 m)   Body mass index is 26.63 kg/m.  Physical Exam Vitals signs and nursing note reviewed.  Constitutional:      General: She is not in acute distress.    Appearance: Normal appearance. She is well-developed. She is not diaphoretic.  HENT:     Head: Normocephalic and atraumatic.     Mouth/Throat:     Pharynx: No oropharyngeal exudate.  Eyes:     Conjunctiva/sclera: Conjunctivae normal.     Pupils: Pupils are equal, round, and reactive to light.  Neck:     Musculoskeletal: Normal range of motion and neck supple.     Thyroid: No thyromegaly.     Vascular: No carotid bruit or JVD.     Trachea: No tracheal deviation.  Cardiovascular:     Rate and Rhythm: Normal rate and regular rhythm.     Heart sounds: Normal heart sounds. No murmur. No friction rub. No gallop.   Pulmonary:     Effort: Pulmonary effort is normal. No respiratory distress.     Breath sounds: Normal breath sounds. No wheezing or rales.  Chest:     Chest wall: No tenderness.  Abdominal:     General: Bowel sounds are normal.     Palpations: Abdomen is soft.     Tenderness: There is no abdominal tenderness.  Musculoskeletal: Normal range of motion.     Comments: Left lower extremity smaller in size than right. Muscle atrophy noted. Gait abnormal.   Lymphadenopathy:     Cervical: No cervical adenopathy.  Skin:    General: Skin is warm and dry.  Neurological:     Mental Status:  She is alert and oriented to person, place, and time. Mental status is at baseline.     Cranial Nerves: No cranial nerve deficit.     Gait: Gait abnormal.     Comments: Pupils are equal, round, and reactive to light and accomodation. Cranial nerves are  intact. Strength is mildly weak but equal, bilaterally.   Psychiatric:        Mood and Affect: Mood is anxious.        Behavior: Behavior normal.        Thought Content: Thought content normal.        Judgment: Judgment normal.   Assessment/Plan: 1. Renal cyst Reviewed results of abdominal ultrasound which showed bilateral renal cysts measuring approximately 3.0cm in diameter. Will refer to urology for further evaluation.  - Ambulatory referral to Urology  2. Hepatic steatosis Abdominal ultrasound did show fatty infiltration of liver. Will continue to monitor.   3. Abnormality of gait and mobility Left lower extremity atrophy. 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 Will look into brace to provide support for the lower extremity.   4. Uncontrolled type 2 diabetes mellitus with hyperglycemia ([place]) Continue diabetic medication as prescribed   5. Essential hypertension Continue bp medication as prescribed   General Counseling: averly ericson understanding of the findings of todays visit and agrees with plan of treatment. I have discussed any further diagnostic evaluation that may be needed or ordered today. We also reviewed her medications today. she has been encouraged to call the office with any questions or concerns that should arise related to todays visit.  This patient was seen by Leretha Pol FNP Collaboration with Dr Lavera Guise as a part of collaborative care agreement  Orders Placed This Encounter  Procedures  . Ambulatory referral to Urology      Time spent: 25 Minutes      Dr Lavera Guise Internal medicine

## 2018-11-20 ENCOUNTER — Encounter: Payer: Self-pay | Admitting: Nurse Practitioner

## 2018-11-25 ENCOUNTER — Other Ambulatory Visit: Payer: Self-pay | Admitting: Nurse Practitioner

## 2018-11-25 DIAGNOSIS — E1165 Type 2 diabetes mellitus with hyperglycemia: Secondary | ICD-10-CM

## 2018-11-25 MED ORDER — VICTOZA 18 MG/3ML ~~LOC~~ SOPN: 1.8000 mg | PEN_INJECTOR | Freq: Every day | SUBCUTANEOUS | 3 refills | Status: DC

## 2018-11-27 ENCOUNTER — Telehealth: Payer: Self-pay

## 2018-11-27 DIAGNOSIS — N281 Cyst of kidney, acquired: Secondary | ICD-10-CM | POA: Insufficient documentation

## 2018-11-27 DIAGNOSIS — R269 Unspecified abnormalities of gait and mobility: Secondary | ICD-10-CM | POA: Insufficient documentation

## 2018-11-28 ENCOUNTER — Other Ambulatory Visit: Payer: Self-pay

## 2018-11-28 DIAGNOSIS — E1165 Type 2 diabetes mellitus with hyperglycemia: Secondary | ICD-10-CM

## 2018-11-28 MED ORDER — VICTOZA 18 MG/3ML ~~LOC~~ SOPN: 1.8000 mg | PEN_INJECTOR | Freq: Every day | SUBCUTANEOUS | 3 refills | Status: DC

## 2018-11-29 ENCOUNTER — Telehealth: Payer: Self-pay | Admitting: Nurse Practitioner

## 2018-11-29 NOTE — Telephone Encounter (Signed)
If we have samples of ozempic, we can give that to her. She would need to take 0.5mg  once weekly. Thanks

## 2018-11-29 NOTE — Telephone Encounter (Signed)
Samples of Ozempic given to patient

## 2018-11-29 NOTE — Telephone Encounter (Signed)
PT CALLED IN REGARDS TO HER VICTOZA , PT STATES THAT THE MEDICATION IS OVER $700 BECAUSE SHE IS IN THE DONUT HOLE WITH INSURANCE , SHE STATE THAT THIS IS WHAT HAPPENED LAST YEAR WITH THE SAME MEDICATION ,

## 2018-12-02 NOTE — Telephone Encounter (Signed)
Hey. What was the clinic you were talking about? Post-polio clinic?

## 2018-12-04 ENCOUNTER — Other Ambulatory Visit: Payer: Self-pay

## 2018-12-13 ENCOUNTER — Other Ambulatory Visit: Payer: Self-pay

## 2018-12-13 ENCOUNTER — Encounter: Payer: Self-pay | Admitting: Urology

## 2018-12-13 ENCOUNTER — Ambulatory Visit (INDEPENDENT_AMBULATORY_CARE_PROVIDER_SITE_OTHER): Payer: Medicare HMO | Admitting: Urology

## 2018-12-13 VITALS — BP 114/69 | HR 103 | Ht 61.0 in | Wt 144.0 lb

## 2018-12-13 DIAGNOSIS — N281 Cyst of kidney, acquired: Secondary | ICD-10-CM

## 2018-12-13 DIAGNOSIS — N179 Acute kidney failure, unspecified: Secondary | ICD-10-CM | POA: Diagnosis not present

## 2018-12-13 NOTE — Progress Notes (Signed)
12/13/2018 2:53 PM   Tiffany Hawkins [birth year] [id number]  Referring provider: Ronnell Freshwater, NP [address] [place],  [place] [address]  Chief Complaint  Patient presents with  . Renal Cyst    New Patient    HPI: 73 year old female referred for further evaluation of bilateral renal cyst.  She had a renal ultrasound performed at [facility] per report shows bilateral renal cyst of which the left was described as complex measuring 3.0 x 2.3 x 2.4 cm.  Unfortunately, we do not have the actual images (report only).  Renal ultrasound was performed for rising creatinine.  Her creatinine was normal on 01/2018.  It rose to 1.25 on 09/2018 prompting this study.  They continue to rise now up to 1.34 on 11/11/2018.  There is no hydronephrosis appreciated on ultrasound per the report.   Notably, she had a CT of the abdomen pelvis with contrast in 2018 which showed bilateral simple renal cyst measuring no more than 2 cm bilaterally.  There are no adverse features or renal pathology identified.  CT scan was personally reviewed today and I agree with the radiologic interpretation.  She denies any flank pain, gross hematuria, any significant urinary symptoms.  PMH: Past Medical History:  Diagnosis Date  . Anxiety   . Colon cancer ([place]) 10/12/2014   had chemo  . Diabetes mellitus without complication ([place])   . Hyperlipidemia   . Hypertension   . Lateral epicondylitis   . Personal history of chemotherapy   . Polio    childhood  . Sleep apnea    no CPAP, sx improved with wt loss    Surgical History: Past Surgical History:  Procedure Laterality Date  . APPENDECTOMY    . BACK SURGERY    . CATARACT EXTRACTION W/PHACO Left 12/03/2017   Procedure: CATARACT EXTRACTION PHACO AND INTRAOCULAR LENS PLACEMENT ([place]) LEFT  DIABETIC;  Surgeon: Eulogio Bear, MD;  Location: [facility];  Service: Ophthalmology;  Laterality: Left;  Diabetic - insulin  . CATARACT EXTRACTION W/PHACO  Right 01/14/2018   Procedure: CATARACT EXTRACTION PHACO AND INTRAOCULAR LENS PLACEMENT ([place]) RIGHT DIABETIC;  Surgeon: Eulogio Bear, MD;  Location: [facility];  Service: Ophthalmology;  Laterality: Right;  Diabetic - insulin  . CERVICAL DISC SURGERY    . CHOLECYSTECTOMY    . COLON SURGERY    . COLONOSCOPY    . COLONOSCOPY WITH PROPOFOL N/A 05/17/2015   Procedure: COLONOSCOPY WITH PROPOFOL;  Surgeon: Manya Silvas, MD;  [facility] ENDOSCOPY;  Service: Endoscopy;  Laterality: N/A;  . COLONOSCOPY WITH PROPOFOL N/A 04/01/2018   Procedure: COLONOSCOPY WITH BIOPSY;  Surgeon: [name], MD;  Location: [facility];  Service: Endoscopy;  Laterality: N/A;  Diabetic - insulin  . ESOPHAGOGASTRODUODENOSCOPY    . HERNIA REPAIR    . POLYPECTOMY N/A 04/01/2018   Procedure: POLYPECTOMY;  Surgeon: [name], MD;  Location: [facility];  Service: Endoscopy;  Laterality: N/A;  . PORT A CATH INJECTION ([place] HX)     Port has been removed  . TUBAL LIGATION      Home Medications:  Allergies as of 12/13/2018   No Known Allergies     Medication List       Accurate as of December 13, 2018 11:59 PM. If you have any questions, ask your nurse or doctor.        STOP taking these medications   chlorpheniramine-HYDROcodone 10-8 MG/5ML [place]er Commonly known as: Tussionex Pennkinetic ER Stopped by: Caryl Pina  Erlene Quan, MD     TAKE these medications   ACCU-CHEK SMARTVIEW CONTROL VI by In Vitro route. Use as di   Accu-Chek SmartView test strip Generic drug: glucose blood Blood sugar checks TID   ALPRAZolam 0.25 MG tablet Commonly known as: XANAX Take 0.25 mg by mouth 2 (two) times daily as needed for anxiety.   amLODipine 5 MG tablet Commonly known as: NORVASC Take 2 tablets (10 mg total) by mouth daily.   benzonatate 200 MG capsule Commonly known as: TESSALON Take one cap TID PRN cough   CELEBREX PO Take by mouth.   dicyclomine 10 MG capsule Commonly known as:  Bentyl Take 1 capsule (10 mg total) by mouth 4 (four) times daily -  before meals and at bedtime.   escitalopram 10 MG tablet Commonly known as: Lexapro Take 1 tablet (10 mg total) by mouth daily.   fenofibrate 54 MG tablet Take 1 tablet (54 mg total) by mouth daily.   ibuprofen 800 MG tablet Commonly known as: ADVIL Take 800 mg by mouth every 8 (eight) hours as needed.   lidocaine-prilocaine cream Commonly known as: EMLA Apply 1 application topically as needed.   lisinopril 5 MG tablet Commonly known as: ZESTRIL Take 1 tablet (5 mg total) by mouth daily.   lovastatin 20 MG tablet Commonly known as: MEVACOR TAKE 1 TABLET AT BEDTIME FOR HIGH CHOLESTEROL   NovoLOG Mix 70/30 FlexPen (70-30) 100 UNIT/ML FlexPen Generic drug: insulin aspart protamine - aspart Inject 0.18 mLs (18 Units total) into the skin 2 (two) times daily.   ondansetron 4 MG tablet Commonly known as: Zofran Take 1 tablet (4 mg total) by mouth every 8 (eight) hours as needed for nausea or vomiting.   ondansetron 8 MG disintegrating tablet Commonly known as: Zofran ODT Take 1 tablet (8 mg total) by mouth 2 (two) times daily.   OZEMPIC (0.25 OR 0.5 MG/DOSE) [place] Inject 0.5 mg into the skin once a week.   pantoprazole 20 MG tablet Commonly known as: PROTONIX Take 40 mg by mouth daily.   pantoprazole 40 MG tablet Commonly known as: PROTONIX TAKE 1 TABLET EVERY DAY  FOR  REFLUX   sharps container 1 each by Does not apply route as needed. To use for insulin syringes and needles. Taking insulin at least twice daily.  E11.65   Victoza 18 MG/3ML Sopn Generic drug: liraglutide Inject 0.3 mLs (1.8 mg total) into the skin daily.       Allergies: No Known Allergies  Family History: Family History  Problem Relation Age of Onset  . Alzheimer's disease Mother   . Heart disease Father   . Colon cancer Sister   . Colon cancer Cousin   . Colon cancer Cousin   . Breast cancer Neg Hx     Social History:   reports that she quit smoking about 37 years ago. Her smoking use included cigarettes. She smoked 1.00 pack per day. She has never used smokeless tobacco. She reports that she does not drink alcohol or use drugs.  ROS: UROLOGY Frequent Urination?: No Hard to postpone urination?: No Burning/pain with urination?: No Get up at night to urinate?: Yes Leakage of urine?: No Urine stream starts and stops?: No Trouble starting stream?: No Do you have to strain to urinate?: No Blood in urine?: No Urinary tract infection?: No Sexually transmitted disease?: No Injury to kidneys or bladder?: No Painful intercourse?: No Weak stream?: No Currently pregnant?: No Vaginal bleeding?: No Last menstrual period?: n  Gastrointestinal  Nausea?: Yes Vomiting?: Yes Indigestion/heartburn?: Yes Diarrhea?: Yes Constipation?: No  Constitutional Fever: No Night sweats?: No Weight loss?: No Fatigue?: Yes  Skin Skin rash/lesions?: No Itching?: Yes  Eyes Blurred vision?: Yes Double vision?: No  Ears/Nose/Throat Sore throat?: No Sinus problems?: No  Hematologic/Lymphatic Swollen glands?: No Easy bruising?: No  Cardiovascular Leg swelling?: No Chest pain?: No  Respiratory Cough?: Yes Shortness of breath?: No  Endocrine Excessive thirst?: No  Musculoskeletal Back pain?: Yes Joint pain?: Yes  Neurological Headaches?: No Dizziness?: Yes  Psychologic Depression?: No Anxiety?: Yes  Physical Exam: BP 114/69   Pulse (!) 103   Ht 5\' 1"  (1.549 m)   Wt 144 lb (65.3 kg)   BMI 27.21 kg/m   Constitutional:  Alert and oriented, No acute distress.  Daughter present by telephone today. HEENT: [place] AT, moist mucus membranes.  Trachea midline, no masses. Cardiovascular: No clubbing, cyanosis, or edema. Respiratory: Normal respiratory effort, no increased work of breathing. GI: Abdomen is soft, nontender, nondistended, no abdominal masses GU: No CVA tenderness Skin: No rashes, bruises  or suspicious lesions. Neurologic: Grossly intact, no focal deficits, moving all 4 extremities. Psychiatric: Normal mood and affect.  Laboratory Data: Lab Results  Component Value Date   WBC 6.4 11/11/2018   HGB 14.7 11/11/2018   HCT 43.2 11/11/2018   MCV 84 11/11/2018   PLT 224 11/11/2018    Lab Results  Component Value Date   CREATININE 1.34 (H) 11/11/2018     Lab Results  Component Value Date   HGBA1C 6.9 (A) 09/30/2018    Urinalysis    Component Value Date/Time   COLORURINE YELLOW (A) 06/22/2016 1725   APPEARANCEUR CLEAR (A) 06/22/2016 1725   APPEARANCEUR Clear 05/16/2012 1321   LABSPEC 1.024 06/22/2016 1725   LABSPEC 1.026 05/16/2012 1321   PHURINE 5.0 06/22/2016 1725   GLUCOSEU NEGATIVE 06/22/2016 1725   GLUCOSEU >=500 05/16/2012 1321   HGBUR NEGATIVE 06/22/2016 1725   BILIRUBINUR NEGATIVE 06/22/2016 1725   BILIRUBINUR Negative 05/16/2012 1321   KETONESUR NEGATIVE 06/22/2016 1725   PROTEINUR 100 (A) 06/22/2016 1725   NITRITE NEGATIVE 06/22/2016 1725   LEUKOCYTESUR TRACE (A) 06/22/2016 1725   LEUKOCYTESUR Negative 05/16/2012 1321    Lab Results  Component Value Date   BACTERIA NONE SEEN 06/22/2016    Pertinent Imaging: CT of abdomen pelvis with contrast on 06/22/2016 was personally reviewed today.  Based on these images, the renal cyst in question does appear smaller on previous imaging.  In 2018, have no pathologic features including no enhancement or any other suspicious features.  Bosniak 1.  Assessment & Plan:    1. Renal cyst We discussed the Bosniak classification for renal cyst today at length as well as as the complexity/size of the lesion increases, so does the concern for underlying malignancy.  Virtually, I am not able to see the images performed at her primary care physician thus not able to classify its Bosniak lesion.  She does not have a disc with her today, only the report.  I recommended that she either bring me the disc or I can repeat  a renal ultrasound in about a month or so as a formal study.  We can use the CT for comparison.  She would like to have the imaging repeated in about a month.  I will call her once I have these results to review.  If she needs to come in for repeat assessment or continued surveillance of this lesion, will make the appropriate arrangements.  All questions were answered. - US RENAL; Future  2. Acute kidney injury ([place]) Rising creatinine, unclear etiology No evidence of obstruction Discussed with her that if her creatinine continues to rise, she may consider referral to nephrology by her primary care physician, she will discuss this further  Will call with RUS results  Hollice Espy, [facility] [address] [place], [place] [address] [phone]

## 2019-01-09 ENCOUNTER — Other Ambulatory Visit: Payer: Self-pay

## 2019-01-09 MED ORDER — LOVASTATIN 20 MG PO TABS: ORAL_TABLET | ORAL | 1 refills | Status: DC

## 2019-01-13 ENCOUNTER — Other Ambulatory Visit: Payer: Self-pay

## 2019-01-13 ENCOUNTER — Ambulatory Visit
Admission: RE | Admit: 2019-01-13 | Discharge: 2019-01-13 | Disposition: A | Discharge status: Home / Self Care | Payer: Medicare HMO | Source: Ambulatory Visit | Attending: Urology | Admitting: Urology

## 2019-01-13 DIAGNOSIS — N281 Cyst of kidney, acquired: Secondary | ICD-10-CM | POA: Diagnosis not present

## 2019-01-15 ENCOUNTER — Telehealth: Payer: Self-pay | Admitting: *Deleted

## 2019-01-15 NOTE — Telephone Encounter (Addendum)
Patient informed-verbalized understanding   ----- Message from Hollice Espy, MD sent at 01/14/2019  7:56 AM EDT ----- Please let this patient know that she has small simple cysts on both kidneys.  These are classified as Bosniak 1 meaning that they have no complexity and no risk for malignancy.  These do not need to be followed.  This is great news. Please have her f/u with [place] as needed.  Hollice Espy, MD

## 2019-01-27 ENCOUNTER — Telehealth: Payer: Self-pay

## 2019-01-28 ENCOUNTER — Other Ambulatory Visit: Payer: Self-pay

## 2019-01-28 ENCOUNTER — Ambulatory Visit (INDEPENDENT_AMBULATORY_CARE_PROVIDER_SITE_OTHER): Payer: Medicare HMO | Admitting: Internal Medicine

## 2019-01-28 ENCOUNTER — Encounter: Payer: Self-pay | Admitting: Internal Medicine

## 2019-01-28 DIAGNOSIS — R05 Cough: Secondary | ICD-10-CM

## 2019-01-28 DIAGNOSIS — E1165 Type 2 diabetes mellitus with hyperglycemia: Secondary | ICD-10-CM

## 2019-01-28 DIAGNOSIS — R1084 Generalized abdominal pain: Secondary | ICD-10-CM | POA: Diagnosis not present

## 2019-01-28 DIAGNOSIS — R058 Other specified cough: Secondary | ICD-10-CM

## 2019-01-28 DIAGNOSIS — N182 Chronic kidney disease, stage 2 (mild): Secondary | ICD-10-CM

## 2019-01-28 DIAGNOSIS — M79604 Pain in right leg: Secondary | ICD-10-CM | POA: Diagnosis not present

## 2019-01-28 DIAGNOSIS — Z794 Long term (current) use of insulin: Secondary | ICD-10-CM

## 2019-01-28 DIAGNOSIS — M79605 Pain in left leg: Secondary | ICD-10-CM | POA: Diagnosis not present

## 2019-01-28 LAB — POCT GLYCOSYLATED HEMOGLOBIN (HGB A1C): Hemoglobin A1C: 5.5 % (ref 4.0–5.6)

## 2019-01-28 NOTE — Progress Notes (Signed)
[facility] [place], [place] [address]  Internal MEDICINE  Office Visit Note  Patient Name: Tiffany Hawkins  [id number]  [id number]  Date of Service: 01/30/2019  Chief Complaint  Patient presents with  . Medical Management of Chronic Issues    Pt went to see  urology didn't relief  . Abdominal Pain  . Diabetes  . Anxiety    HPI Pt is here with few complaints 1. Generalized abdominal pain, seen by urology for possible renal cyst on U/S. No obvious cause, elevated renal functions, pt is also on Ozempic  2. Heaviness in legs, difficulty walking.  3. Cough, dry and hacking, post nasal drip ( on lisinopril)   Current Medication: Outpatient Encounter Medications as of 01/28/2019  Medication Sig Note  . ALPRAZolam (XANAX) 0.25 MG tablet Take 0.25 mg by mouth 2 (two) times daily as needed for anxiety. 01/14/2018: PRN  . amLODipine (NORVASC) 5 MG tablet Take 2 tablets (10 mg total) by mouth daily.   . benzonatate (TESSALON) 200 MG capsule Take one cap TID PRN cough   . Blood Glucose Calibration (ACCU-CHEK SMARTVIEW CONTROL VI) by In Vitro route. Use as di   . dicyclomine (BENTYL) 10 MG capsule Take 1 capsule (10 mg total) by mouth 4 (four) times daily -  before meals and at bedtime.   Marland Kitchen escitalopram (LEXAPRO) 10 MG tablet Take 1 tablet (10 mg total) by mouth daily.   . fenofibrate 54 MG tablet Take 1 tablet (54 mg total) by mouth daily.   Marland Kitchen glucose blood (ACCU-CHEK SMARTVIEW) test strip Blood sugar checks TID   . ibuprofen (ADVIL,MOTRIN) 800 MG tablet Take 800 mg by mouth every 8 (eight) hours as needed.   . insulin aspart protamine - aspart (NOVOLOG MIX 70/30 FLEXPEN) (70-30) 100 UNIT/ML FlexPen Inject 0.18 mLs (18 Units total) into the skin 2 (two) times daily.   Marland Kitchen lidocaine-prilocaine (EMLA) cream Apply 1 application topically as needed. 01/14/2018: prn  . lisinopril (ZESTRIL) 5 MG tablet Take 1 tablet (5 mg total) by mouth daily.   Marland Kitchen lovastatin (MEVACOR) 20  MG tablet TAKE 1 TABLET AT BEDTIME FOR HIGH CHOLESTEROL   . ondansetron (ZOFRAN ODT) 8 MG disintegrating tablet Take 1 tablet (8 mg total) by mouth 2 (two) times daily.   . ondansetron (ZOFRAN) 4 MG tablet Take 1 tablet (4 mg total) by mouth every 8 (eight) hours as needed for nausea or vomiting.   . pantoprazole (PROTONIX) 20 MG tablet Take 40 mg by mouth daily.    . pantoprazole (PROTONIX) 40 MG tablet TAKE 1 TABLET EVERY DAY  FOR  REFLUX   . Semaglutide (OZEMPIC, 0.25 OR 0.5 MG/DOSE, [place]) Inject 0.5 mg into the skin once a week.   . sharps container 1 each by Does not apply route as needed. To use for insulin syringes and needles. Taking insulin at least twice daily.  E11.65   . [DISCONTINUED] Celecoxib (CELEBREX PO) Take by mouth.   . [DISCONTINUED] liraglutide (VICTOZA) 18 MG/3ML SOPN Inject 0.3 mLs (1.8 mg total) into the skin daily.    No facility-administered encounter medications on file as of 01/28/2019.     Surgical History: Past Surgical History:  Procedure Laterality Date  . APPENDECTOMY    . BACK SURGERY    . CATARACT EXTRACTION W/PHACO Left 12/03/2017   Procedure: CATARACT EXTRACTION PHACO AND INTRAOCULAR LENS PLACEMENT ([place]) LEFT  DIABETIC;  Surgeon: Eulogio Bear, MD;  Location: [place];  Service: Ophthalmology;  Laterality: Left;  Diabetic - insulin  . CATARACT EXTRACTION W/PHACO Right 01/14/2018   Procedure: CATARACT EXTRACTION PHACO AND INTRAOCULAR LENS PLACEMENT ([place]) RIGHT DIABETIC;  Surgeon: Eulogio Bear, MD;  Location: [place];  Service: Ophthalmology;  Laterality: Right;  Diabetic - insulin  . CERVICAL DISC SURGERY    . CHOLECYSTECTOMY    . COLON SURGERY    . COLONOSCOPY    . COLONOSCOPY WITH PROPOFOL N/A 05/17/2015   Procedure: COLONOSCOPY WITH PROPOFOL;  Surgeon: Manya Silvas, MD;  Location: [facility] ENDOSCOPY;  Service: Endoscopy;  Laterality: N/A;  . COLONOSCOPY WITH PROPOFOL N/A 04/01/2018   Procedure: COLONOSCOPY WITH BIOPSY;   Surgeon: [name], MD;  Location: [place];  Service: Endoscopy;  Laterality: N/A;  Diabetic - insulin  . ESOPHAGOGASTRODUODENOSCOPY    . HERNIA REPAIR    . POLYPECTOMY N/A 04/01/2018   Procedure: POLYPECTOMY;  Surgeon: [name], MD;  Location: [place];  Service: Endoscopy;  Laterality: N/A;  . PORT A CATH INJECTION ([place] [facility])     Port has been removed  . TUBAL LIGATION      Medical History: Past Medical History:  Diagnosis Date  . Anxiety   . Colon cancer ([place]) 10/12/2014   had chemo  . Diabetes mellitus without complication ([place])   . Hyperlipidemia   . Hypertension   . Lateral epicondylitis   . Personal history of chemotherapy   . Polio    childhood  . Sleep apnea    no CPAP, sx improved with wt loss    Family History: Family History  Problem Relation Age of Onset  . Alzheimer's disease Mother   . Heart disease Father   . Colon cancer Sister   . Colon cancer Cousin   . Colon cancer Cousin   . Breast cancer Neg Hx     Social History   Socioeconomic History  . Marital status: Married    Spouse name: Not on file  . Number of children: Not on file  . Years of education: Not on file  . Highest education level: Not on file  Occupational History  . Not on file  Social Needs  . Financial resource strain: Not on file  . Food insecurity    Worry: Not on file    Inability: Not on file  . Transportation needs    Medical: Not on file    Non-medical: Not on file  Tobacco Use  . Smoking status: Former Smoker    Packs/day: 1.00    Types: Cigarettes    Quit date: 04/17/1981    Years since quitting: 37.8  . Smokeless tobacco: Never Used  Substance and Sexual Activity  . Alcohol use: No  . Drug use: No  . Sexual activity: Never  Lifestyle  . Physical activity    Days per week: Not on file    Minutes per session: Not on file  . Stress: Not on file  Relationships  . Social Herbalist on phone: Not on file    Gets together:  Not on file    Attends religious service: Not on file    Active member of club or organization: Not on file    Attends meetings of clubs or organizations: Not on file    Relationship status: Not on file  . Intimate partner violence    Fear of current or ex partner: Not on file    Emotionally abused: Not on file    Physically abused: Not  on file    Forced sexual activity: Not on file  Other Topics Concern  . Not on file  Social History Narrative  . Not on file   Review of Systems  Constitutional: Negative for chills, diaphoresis and fatigue.  HENT: Negative for ear pain, postnasal drip and sinus pressure.   Eyes: Negative for photophobia, discharge, redness, itching and visual disturbance.  Respiratory: Negative for cough, shortness of breath and wheezing.   Cardiovascular: Negative for chest pain, palpitations and leg swelling.  Gastrointestinal: Positive for abdominal pain. Negative for constipation, diarrhea, nausea and vomiting.  Genitourinary: Negative for dysuria and flank pain.  Musculoskeletal: Positive for gait problem. Negative for arthralgias, back pain and neck pain.  Skin: Negative for color change.  Allergic/Immunologic: Negative for environmental allergies and food allergies.  Neurological: Negative for dizziness and headaches.  Hematological: Does not bruise/bleed easily.  Psychiatric/Behavioral: Negative for agitation, behavioral problems (depression) and hallucinations.    Vital Signs: BP 133/80   Pulse 92   Temp (!) 97.2 F (36.2 C)   Resp 16   Ht 5' 1.5" (1.562 m)   Wt 141 lb 9.6 oz (64.2 kg)   SpO2 94%   BMI 26.32 kg/m    Physical Exam Constitutional:      General: She is not in acute distress.    Appearance: She is well-developed. She is not diaphoretic.  HENT:     Head: Normocephalic and atraumatic.     Mouth/Throat:     Pharynx: No oropharyngeal exudate.  Eyes:     Pupils: Pupils are equal, round, and reactive to light.  Neck:      Musculoskeletal: Normal range of motion and neck supple.     Thyroid: No thyromegaly.     Vascular: No JVD.     Trachea: No tracheal deviation.  Cardiovascular:     Rate and Rhythm: Normal rate and regular rhythm.     Heart sounds: Normal heart sounds. No murmur. No friction rub. No gallop.   Pulmonary:     Effort: Pulmonary effort is normal. No respiratory distress.     Breath sounds: No wheezing or rales.  Chest:     Chest wall: No tenderness.  Abdominal:     General: Abdomen is flat. Bowel sounds are normal.     Palpations: Abdomen is soft.     Tenderness: There is abdominal tenderness.  Musculoskeletal: Normal range of motion.  Lymphadenopathy:     Cervical: No cervical adenopathy.  Skin:    General: Skin is warm and dry.     Comments: Pt has decreased peripheral pulses   Neurological:     Mental Status: She is alert and oriented to person, place, and time.     Cranial Nerves: No cranial nerve deficit.  Psychiatric:        Behavior: Behavior normal.        Thought Content: Thought content normal.        Judgment: Judgment normal.    Assessment/Plan: 1. Generalized abdominal pain Vague in nature, nausea as well, will stop ozempic, hg A1c is 5.5  2. Pain in both lower extremities Pt seems to be a poor historian decreased PP - POCT ABI Screening Pilot No Charge ( abnormal)  - US ARTERIAL LOWER EXTREMITY DUPLEX BILATERAL; Future  3. Chronic kidney disease, stage II (mild) Will monitor for now  4. Recurrent dry cough Pt is instructed to get Flonase OTC, if no improement seen, might need to stop ace inh, or need allergy testing,  will follow   5. Type 2 diabetes mellitus with hyperglycemia, with long-term current use of insulin (HCC) - POCT HgB A1C - US ARTERIAL LOWER EXTREMITY DUPLEX BILATERAL; Future  General Counseling: calise bute understanding of the findings of todays visit and agrees with plan of treatment. I have discussed any further diagnostic evaluation  that may be needed or ordered today. We also reviewed her medications today. she has been encouraged to call the office with any questions or concerns that should arise related to todays visit.    Orders Placed This Encounter  Procedures  . US ARTERIAL LOWER EXTREMITY DUPLEX BILATERAL  . POCT HgB A1C  . POCT ABI Screening Pilot No Charge     Time spent:35 Minutes  Dr Lavera Guise Internal medicine

## 2019-01-28 NOTE — Telephone Encounter (Signed)
Error

## 2019-02-13 ENCOUNTER — Other Ambulatory Visit: Payer: Self-pay | Admitting: Internal Medicine

## 2019-02-13 DIAGNOSIS — Z1231 Encounter for screening mammogram for malignant neoplasm of breast: Secondary | ICD-10-CM

## 2019-02-14 ENCOUNTER — Ambulatory Visit: Payer: Medicare HMO

## 2019-02-14 ENCOUNTER — Other Ambulatory Visit: Payer: Self-pay

## 2019-02-14 DIAGNOSIS — Z794 Long term (current) use of insulin: Secondary | ICD-10-CM

## 2019-02-14 DIAGNOSIS — M79604 Pain in right leg: Secondary | ICD-10-CM | POA: Diagnosis not present

## 2019-02-14 DIAGNOSIS — M79605 Pain in left leg: Secondary | ICD-10-CM | POA: Diagnosis not present

## 2019-02-14 DIAGNOSIS — E1165 Type 2 diabetes mellitus with hyperglycemia: Secondary | ICD-10-CM

## 2019-02-14 DIAGNOSIS — N182 Chronic kidney disease, stage 2 (mild): Secondary | ICD-10-CM

## 2019-02-24 ENCOUNTER — Ambulatory Visit (INDEPENDENT_AMBULATORY_CARE_PROVIDER_SITE_OTHER): Payer: Medicare HMO | Admitting: Nurse Practitioner

## 2019-02-24 ENCOUNTER — Other Ambulatory Visit: Payer: Self-pay

## 2019-02-24 ENCOUNTER — Encounter: Payer: Self-pay | Admitting: Nurse Practitioner

## 2019-02-24 VITALS — BP 130/73 | HR 87 | Temp 97.3°F | Resp 16 | Ht 61.0 in | Wt 142.6 lb

## 2019-02-24 DIAGNOSIS — N182 Chronic kidney disease, stage 2 (mild): Secondary | ICD-10-CM | POA: Diagnosis not present

## 2019-02-24 DIAGNOSIS — Z794 Long term (current) use of insulin: Secondary | ICD-10-CM

## 2019-02-24 DIAGNOSIS — E1165 Type 2 diabetes mellitus with hyperglycemia: Secondary | ICD-10-CM

## 2019-02-24 DIAGNOSIS — K219 Gastro-esophageal reflux disease without esophagitis: Secondary | ICD-10-CM | POA: Diagnosis not present

## 2019-02-24 DIAGNOSIS — Z0001 Encounter for general adult medical examination with abnormal findings: Secondary | ICD-10-CM

## 2019-02-24 DIAGNOSIS — Z23 Encounter for immunization: Secondary | ICD-10-CM | POA: Diagnosis not present

## 2019-02-24 DIAGNOSIS — R519 Headache, unspecified: Secondary | ICD-10-CM | POA: Diagnosis not present

## 2019-02-24 DIAGNOSIS — I10 Essential (primary) hypertension: Secondary | ICD-10-CM | POA: Diagnosis not present

## 2019-02-24 MED ORDER — IBUPROFEN 800 MG PO TABS: 800.0000 mg | ORAL_TABLET | Freq: Three times a day (TID) | ORAL | 1 refills | Status: DC | PRN

## 2019-02-24 MED ORDER — TETANUS-DIPHTH-ACELL PERTUSSIS 5-2.5-18.5 LF-MCG/0.5 IM SUSP: 0.5000 mL | Freq: Once | INTRAMUSCULAR | 0 refills | Status: AC

## 2019-02-24 MED ORDER — PANTOPRAZOLE SODIUM 20 MG PO TBEC: 40.0000 mg | DELAYED_RELEASE_TABLET | Freq: Every day | ORAL | 1 refills | Status: DC

## 2019-02-24 NOTE — Progress Notes (Signed)
[facility] [place], [place] [address]  Internal MEDICINE  Office Visit Note  Patient Name: Tiffany Hawkins  [id number]  [id number]  Date of Service: 03/14/2019  Chief Complaint  Patient presents with  . Annual Exam    review ultrasound  . Diabetes  . Hypertension  . Hyperlipidemia  . Quality Metric Gaps    pna vacc  . Cough     The patient is here for health maintenance exam today. Blood sugars are doing well with medication changes. Her last Hgba1c was 01/28/2019 and was 5.5, which is the best it has been in some time. She states that she is feeling well with no major concerns or complaints today. Her family is expecting the birth of a new great grand child. She is overdue to have a Tdap booster and would like to have this before the new baby arrives. Herr most recent labs done in 10/2018 did indicate elevated renal functions. She should have these rechecked since medication changes were made. She did have ultrasound of the kidneys which did show simple renal cysts, but was otherwise, without abnormalities. She has mammogram scheduled for 02/27/2019. She did have arterial ultrasound of both lower extremities since her most recent visit and this showed no evidence of arterial stenosis, bilaterally.   Pt is here for routine health maintenance examination  Current Medication: Outpatient Encounter Medications as of 02/24/2019  Medication Sig Note  . citalopram (CELEXA) 20 MG tablet Take 20 mg by mouth daily. Take 2 tablets daily for depression   . fenofibrate 54 MG tablet Take 1 tablet (54 mg total) by mouth daily.   Marland Kitchen glucose blood (ACCU-CHEK SMARTVIEW) test strip Blood sugar checks TID   . ibuprofen (ADVIL) 800 MG tablet Take 1 tablet (800 mg total) by mouth every 8 (eight) hours as needed.   . insulin aspart protamine - aspart (NOVOLOG MIX 70/30 FLEXPEN) (70-30) 100 UNIT/ML FlexPen Inject 0.18 mLs (18 Units total) into the skin 2 (two) times daily.   Marland Kitchen  lovastatin (MEVACOR) 20 MG tablet TAKE 1 TABLET AT BEDTIME FOR HIGH CHOLESTEROL   . ondansetron (ZOFRAN ODT) 8 MG disintegrating tablet Take 1 tablet (8 mg total) by mouth 2 (two) times daily.   . ondansetron (ZOFRAN) 4 MG tablet Take 1 tablet (4 mg total) by mouth every 8 (eight) hours as needed for nausea or vomiting.   . pantoprazole (PROTONIX) 20 MG tablet Take 2 tablets (40 mg total) by mouth daily.   . sharps container 1 each by Does not apply route as needed. To use for insulin syringes and needles. Taking insulin at least twice daily.  E11.65   . [DISCONTINUED] amLODipine (NORVASC) 5 MG tablet Take 2 tablets (10 mg total) by mouth daily.   . [DISCONTINUED] ibuprofen (ADVIL,MOTRIN) 800 MG tablet Take 800 mg by mouth every 8 (eight) hours as needed.   . [DISCONTINUED] lisinopril (ZESTRIL) 5 MG tablet Take 1 tablet (5 mg total) by mouth daily.   . [DISCONTINUED] pantoprazole (PROTONIX) 20 MG tablet Take 40 mg by mouth daily.    . Blood Glucose Calibration (ACCU-CHEK SMARTVIEW CONTROL VI) by In Vitro route. Use as di   . [EXPIRED] Tdap (BOOSTRIX) 5-2.5-18.5 LF-MCG/0.5 injection Inject 0.5 mLs into the muscle once for 1 dose.   . [DISCONTINUED] ALPRAZolam (XANAX) 0.25 MG tablet Take 0.25 mg by mouth 2 (two) times daily as needed for anxiety. 01/14/2018: PRN  . [DISCONTINUED] benzonatate (TESSALON) 200 MG capsule Take one cap TID  PRN cough (Patient not taking: Reported on 02/24/2019)   . [DISCONTINUED] Celecoxib (CELEBREX PO) Take by mouth.   . [DISCONTINUED] chlorpheniramine-HYDROcodone (TUSSIONEX PENNKINETIC ER) 10-8 MG/5ML SUER Take 5 mLs by mouth at bedtime as needed.   . [DISCONTINUED] dicyclomine (BENTYL) 10 MG capsule Take 1 capsule (10 mg total) by mouth 4 (four) times daily -  before meals and at bedtime. (Patient not taking: Reported on 02/24/2019)   . [DISCONTINUED] escitalopram (LEXAPRO) 10 MG tablet Take 1 tablet (10 mg total) by mouth daily. (Patient not taking: Reported on 02/24/2019)    . [DISCONTINUED] lidocaine-prilocaine (EMLA) cream Apply 1 application topically as needed. (Patient not taking: Reported on 02/24/2019) 01/14/2018: prn  . [DISCONTINUED] liraglutide (VICTOZA) 18 MG/3ML SOPN Inject 0.3 mLs (1.8 mg total) into the skin daily.   . [DISCONTINUED] lovastatin (MEVACOR) 20 MG tablet TAKE 1 TABLET AT BEDTIME FOR HIGH CHOLESTEROL   . [DISCONTINUED] pantoprazole (PROTONIX) 40 MG tablet TAKE 1 TABLET EVERY DAY  FOR  REFLUX (Patient not taking: Reported on 02/24/2019)   . [DISCONTINUED] Semaglutide (OZEMPIC, 0.25 OR 0.5 MG/DOSE, [place]) Inject 0.5 mg into the skin once a week.    No facility-administered encounter medications on file as of 02/24/2019.     Surgical History: Past Surgical History:  Procedure Laterality Date  . APPENDECTOMY    . BACK SURGERY    . CATARACT EXTRACTION W/PHACO Left 12/03/2017   Procedure: CATARACT EXTRACTION PHACO AND INTRAOCULAR LENS PLACEMENT ([place]) LEFT  DIABETIC;  Surgeon: Eulogio Bear, MD;  Location: [place];  Service: Ophthalmology;  Laterality: Left;  Diabetic - insulin  . CATARACT EXTRACTION W/PHACO Right 01/14/2018   Procedure: CATARACT EXTRACTION PHACO AND INTRAOCULAR LENS PLACEMENT ([place]) RIGHT DIABETIC;  Surgeon: Eulogio Bear, MD;  Location: [place];  Service: Ophthalmology;  Laterality: Right;  Diabetic - insulin  . CERVICAL DISC SURGERY    . CHOLECYSTECTOMY    . COLON SURGERY    . COLONOSCOPY    . COLONOSCOPY WITH PROPOFOL N/A 05/17/2015   Procedure: COLONOSCOPY WITH PROPOFOL;  Surgeon: Manya Silvas, MD;  Location[facility]COPY;  Service: Endoscopy;  Laterality: N/A;  . COLONOSCOPY WITH PROPOFOL N/A 04/01/2018   Procedure: COLONOSCOPY WITH BIOPSY;  Surgeon: [name], MD;  Location: [place];  Service: Endoscopy;  Laterality: N/A;  Diabetic - insulin  . ESOPHAGOGASTRODUODENOSCOPY    . HERNIA REPAIR    . POLYPECTOMY N/A 04/01/2018   Procedure: POLYPECTOMY;  Surgeon: [name],  MD;  Location: [place];  Service: Endoscopy;  Laterality: N/A;  . PORT A CATH INJECTION ([place] [facility])     Port has been removed  . TUBAL LIGATION      Medical History: Past Medical History:  Diagnosis Date  . Anxiety   . Colon cancer ([place]) 10/12/2014   had chemo  . Diabetes mellitus without complication ([place])   . Hyperlipidemia   . Hypertension   . Lateral epicondylitis   . Personal history of chemotherapy   . Polio    childhood  . Sleep apnea    no CPAP, sx improved with wt loss    Family History: Family History  Problem Relation Age of Onset  . Alzheimer's disease Mother   . Heart disease Father   . Colon cancer Sister   . Colon cancer Cousin   . Colon cancer Cousin   . Breast cancer Neg Hx       Review of Systems  Constitutional: Negative for chills, fatigue and unexpected  weight change.  HENT: Negative for congestion, postnasal drip, rhinorrhea, sneezing and sore throat.   Respiratory: Negative for cough, chest tightness and shortness of breath.   Cardiovascular: Negative for chest pain and palpitations.  Gastrointestinal: Negative for abdominal pain, constipation, diarrhea, nausea and vomiting.  Endocrine: Negative for cold intolerance, heat intolerance, polydipsia and polyuria.       Blood sugars doing well   Genitourinary: Negative for dysuria, frequency and urgency.  Musculoskeletal: Negative for arthralgias, back pain, joint swelling and neck pain.  Skin: Negative for rash.  Allergic/Immunologic: Negative for environmental allergies.  Neurological: Positive for dizziness. Negative for tremors, numbness and headaches.  Hematological: Negative for adenopathy. Does not bruise/bleed easily.  Psychiatric/Behavioral: Negative for behavioral problems (Depression), sleep disturbance and suicidal ideas. The patient is nervous/anxious.      Today's Vitals   02/24/19 1207  BP: 130/73  Pulse: 87  Resp: 16  Temp: (!) 97.3 F (36.3 C)  SpO2: 96%   Weight: 142 lb 9.6 oz (64.7 kg)  Height: 5\' 1"  (1.549 m)   Body mass index is 26.94 kg/m.  Physical Exam Vitals signs and nursing note reviewed.  Constitutional:      General: She is not in acute distress.    Appearance: Normal appearance. She is well-developed. She is not diaphoretic.  HENT:     Head: Normocephalic and atraumatic.     Nose: Nose normal.     Mouth/Throat:     Pharynx: No oropharyngeal exudate.  Eyes:     Extraocular Movements: Extraocular movements intact.     Pupils: Pupils are equal, round, and reactive to light.  Neck:     Musculoskeletal: Normal range of motion and neck supple.     Thyroid: No thyromegaly.     Vascular: No carotid bruit or JVD.     Trachea: No tracheal deviation.  Cardiovascular:     Rate and Rhythm: Normal rate and regular rhythm.     Pulses: Normal pulses.          Dorsalis pedis pulses are 2+ on the right side and 2+ on the left side.       Posterior tibial pulses are 2+ on the right side and 2+ on the left side.     Heart sounds: Normal heart sounds. No murmur. No friction rub. No gallop.   Pulmonary:     Effort: Pulmonary effort is normal. No respiratory distress.     Breath sounds: Normal breath sounds. No wheezing or rales.  Chest:     Chest wall: No tenderness.     Breasts:        Right: Normal. No swelling, bleeding, inverted nipple, mass, nipple discharge, skin change or tenderness.        Left: Normal. No swelling, bleeding, inverted nipple, mass, nipple discharge, skin change or tenderness.  Abdominal:     General: Bowel sounds are normal.     Palpations: Abdomen is soft.     Tenderness: There is no abdominal tenderness.  Musculoskeletal: Normal range of motion.     Right foot: Normal range of motion. No deformity.     Left foot: Normal range of motion. No deformity.  Feet:     Right foot:     Protective Sensation: 10 sites tested. 10 sites sensed.     Skin integrity: Skin integrity normal.     Toenail Condition:  Right toenails are normal.     Left foot:     Protective Sensation: 10 sites tested. 10 sites  sensed.     Skin integrity: Skin integrity normal.     Toenail Condition: Left toenails are normal.  Lymphadenopathy:     Cervical: No cervical adenopathy.     Upper Body:     Right upper body: No axillary adenopathy.     Left upper body: No axillary adenopathy.  Skin:    General: Skin is warm and dry.  Neurological:     General: No focal deficit present.     Mental Status: She is alert and oriented to person, place, and time. Mental status is at baseline.     Cranial Nerves: No cranial nerve deficit.  Psychiatric:        Mood and Affect: Mood normal.        Behavior: Behavior normal.        Thought Content: Thought content normal.        Judgment: Judgment normal.    Depression screen [facility] 2/9 02/24/2019 10/31/2018 09/30/2018 08/20/2018 05/09/2018  Decreased Interest 0 0 0 0 0  Down, Depressed, Hopeless 0 0 0 0 0  PHQ - 2 Score 0 0 0 0 0    Functional Status Survey: Is the patient deaf or have difficulty hearing?: Yes Does the patient have difficulty seeing, even when wearing glasses/contacts?: No Does the patient have difficulty concentrating, remembering, or making decisions?: No Does the patient have difficulty walking or climbing stairs?: Yes(balance issues) Does the patient have difficulty dressing or bathing?: Yes(balance issues) Does the patient have difficulty doing errands alone such as visiting a doctor's office or shopping?: No  MMSE - Mini Mental State Exam 02/24/2019 02/22/2018  Orientation to time 5 5  Orientation to Place 5 5  Registration 3 3  Attention/ Calculation 5 5  Recall 3 3  Language- name 2 objects 2 2  Language- repeat 1 1  Language- follow 3 step command 3 3  Language- read & follow direction 1 1  Write a sentence 1 1  Copy design 1 1  Total score 30 30    Fall Risk  02/24/2019 10/31/2018 09/30/2018 08/20/2018 08/13/2018  Falls in the past year? 0 1 0 0 0   Number falls in past yr: - 0 - - -  Injury with Fall? - 0 - - -      LABS: Recent Results (from the past 2160 hour(s))  POCT HgB A1C     Status: None   Collection Time: 01/28/19 10:54 AM  Result Value Ref Range   Hemoglobin A1C 5.5 4.0 - 5.6 %   HbA1c POC (<> result, manual entry)     HbA1c, POC (prediabetic range)     HbA1c, POC (controlled diabetic range)    Basic metabolic panel     Status: Abnormal   Collection Time: 03/11/19  9:41 AM  Result Value Ref Range   Sodium 137 135 - 145 mmol/L   Potassium 3.7 3.5 - 5.1 mmol/L   Chloride 103 98 - 111 mmol/L   CO2 23 22 - 32 mmol/L   Glucose, Bld 147 (H) 70 - 99 mg/dL   BUN 23 8 - 23 mg/dL   Creatinine, Ser 1.04 (H) 0.44 - 1.00 mg/dL   Calcium 9.2 8.9 - 10.3 mg/dL   GFR calc non Af Amer 53 (L) >60 mL/min   GFR calc Af Amer >60 >60 mL/min   Anion gap 11 5 - 15    Comment: Performed at [facility], [address], [place], [place] [address]  Assessment/Plan:  1. Encounter for general adult medical examination with abnormal findings Annual health maintenance exam today.   2. Type 2 diabetes mellitus with hyperglycemia, with long-term current use of insulin ([place]) Most recent HgbA1c 01/2019 is 5.5. continue diabetic medication as prescribed. Monitor blood sugars closely.   3. Essential hypertension Stable. Continue bp medication as prescribed   4. Chronic kidney disease, stage II (mild) Recheck BMP for further evaluation.  5. Gastroesophageal reflux disease without esophagitis - pantoprazole (PROTONIX) 20 MG tablet; Take 2 tablets (40 mg total) by mouth daily.  Dispense: 90 tablet; Refill: 1  6. Intractable episodic headache, unspecified headache type May take ibuptofen 800mg  as needed and as prescribed for acute headaches.  - ibuprofen (ADVIL) 800 MG tablet; Take 1 tablet (800 mg total) by mouth every 8 (eight) hours as needed.  Dispense: 90 tablet; Refill: 1  7. Need for prophylactic vaccination with  combined diphtheria-tetanus-pertussis (DTaP) vaccine Prescription for Tdap booster sent to her pharmacy for administration.  - Tdap ([place]) 5-2.5-18.5 LF-MCG/0.5 injection; Inject 0.5 mLs into the muscle once for 1 dose.  Dispense: 0.5 mL; Refill: 0.  General Counseling: jenniferlynn hodgdon understanding of the findings of todays visit and agrees with plan of treatment. I have discussed any further diagnostic evaluation that may be needed or ordered today. We also reviewed her medications today. she has been encouraged to call the office with any questions or concerns that should arise related to todays visit.    Counseling:  Diabetes Counseling:  1. Addition of ACE inh/ ARB'S for nephroprotection. Microalbumin is updated  2. Diabetic foot care, prevention of complications. Podiatry consult 3. Exercise and lose weight.  4. Diabetic eye examination, Diabetic eye exam is updated  5. Monitor blood sugar closlely. nutrition counseling.  6. Sign and symptoms of hypoglycemia including shaking sweating,confusion and headaches.  This patient was seen by [place] with Dr Lavera Guise as a part of collaborative care agreement  Meds ordered this encounter  Medications  . pantoprazole (PROTONIX) 20 MG tablet    Sig: Take 2 tablets (40 mg total) by mouth daily.    Dispense:  90 tablet    Refill:  1    Order Specific Question:   Supervising Provider    Answer:   Lavera Guise [id number]  . ibuprofen (ADVIL) 800 MG tablet    Sig: Take 1 tablet (800 mg total) by mouth every 8 (eight) hours as needed.    Dispense:  90 tablet    Refill:  1    Order Specific Question:   Supervising Provider    Answer:   Lavera Guise [id number]  . Tdap (BOOSTRIX) 5-2.5-18.5 LF-MCG/0.5 injection    Sig: Inject 0.5 mLs into the muscle once for 1 dose.    Dispense:  0.5 mL    Refill:  0    Order Specific Question:   Supervising Provider    Answer:   Lavera Guise [id number]    Time spent: [place], MD  Internal Medicine

## 2019-02-26 ENCOUNTER — Other Ambulatory Visit: Payer: Self-pay

## 2019-02-26 ENCOUNTER — Ambulatory Visit
Admission: RE | Admit: 2019-02-26 | Discharge: 2019-02-26 | Disposition: A | Discharge status: Home / Self Care | Payer: Medicare HMO | Source: Ambulatory Visit | Attending: Internal Medicine | Admitting: Internal Medicine

## 2019-02-26 DIAGNOSIS — Z1231 Encounter for screening mammogram for malignant neoplasm of breast: Secondary | ICD-10-CM | POA: Insufficient documentation

## 2019-02-28 ENCOUNTER — Telehealth: Payer: Self-pay

## 2019-02-28 NOTE — Telephone Encounter (Signed)
Pt was notified.  

## 2019-02-28 NOTE — Telephone Encounter (Signed)
-----   Message from Lavera Guise, MD sent at 02/27/2019  6:16 PM EST ----- Arterial dopplers are within normal limits, will be discussed on next visit

## 2019-03-03 ENCOUNTER — Other Ambulatory Visit: Payer: Self-pay | Admitting: Internal Medicine

## 2019-03-03 DIAGNOSIS — I10 Essential (primary) hypertension: Secondary | ICD-10-CM

## 2019-03-03 MED ORDER — AMLODIPINE BESYLATE 5 MG PO TABS: 10.0000 mg | ORAL_TABLET | Freq: Every day | ORAL | 1 refills | Status: DC

## 2019-03-10 ENCOUNTER — Other Ambulatory Visit: Payer: Self-pay

## 2019-03-10 DIAGNOSIS — I10 Essential (primary) hypertension: Secondary | ICD-10-CM

## 2019-03-10 MED ORDER — LISINOPRIL 5 MG PO TABS: 5.0000 mg | ORAL_TABLET | Freq: Every day | ORAL | 3 refills | Status: DC

## 2019-03-11 ENCOUNTER — Other Ambulatory Visit: Payer: Self-pay

## 2019-03-11 ENCOUNTER — Other Ambulatory Visit
Admission: RE | Admit: 2019-03-11 | Discharge: 2019-03-11 | Disposition: A | Discharge status: Home / Self Care | Payer: Medicare HMO | Attending: Nurse Practitioner | Admitting: Nurse Practitioner

## 2019-03-11 DIAGNOSIS — R945 Abnormal results of liver function studies: Secondary | ICD-10-CM | POA: Diagnosis not present

## 2019-03-11 LAB — BASIC METABOLIC PANEL
Anion gap: 11 (ref 5–15)
BUN: 23 mg/dL (ref 8–23)
CO2: 23 mmol/L (ref 22–32)
Calcium: 9.2 mg/dL (ref 8.9–10.3)
Chloride: 103 mmol/L (ref 98–111)
Creatinine, Ser: 1.04 mg/dL — ABNORMAL HIGH (ref 0.44–1.00)
GFR calc Af Amer: >60 mL/min (ref >60)
GFR calc non Af Amer: 53 mL/min — ABNORMAL LOW (ref >60)
Glucose, Bld: 147 mg/dL — ABNORMAL HIGH (ref 70–99)
Potassium: 3.7 mmol/L (ref 3.5–5.1)
Sodium: 137 mmol/L (ref 135–145)

## 2019-03-11 NOTE — Progress Notes (Signed)
Labs look better. Improved renal functions.

## 2019-03-12 ENCOUNTER — Other Ambulatory Visit: Payer: Self-pay

## 2019-03-12 DIAGNOSIS — I10 Essential (primary) hypertension: Secondary | ICD-10-CM

## 2019-03-12 MED ORDER — LISINOPRIL 5 MG PO TABS: 5.0000 mg | ORAL_TABLET | Freq: Every day | ORAL | 1 refills | Status: DC

## 2019-03-14 DIAGNOSIS — R519 Headache, unspecified: Secondary | ICD-10-CM | POA: Insufficient documentation

## 2019-03-14 DIAGNOSIS — K219 Gastro-esophageal reflux disease without esophagitis: Secondary | ICD-10-CM | POA: Insufficient documentation

## 2019-03-14 DIAGNOSIS — Z23 Encounter for immunization: Secondary | ICD-10-CM | POA: Insufficient documentation

## 2019-03-14 DIAGNOSIS — N182 Chronic kidney disease, stage 2 (mild): Secondary | ICD-10-CM | POA: Insufficient documentation

## 2019-03-19 ENCOUNTER — Other Ambulatory Visit: Payer: Self-pay

## 2019-03-19 DIAGNOSIS — I10 Essential (primary) hypertension: Secondary | ICD-10-CM

## 2019-03-19 MED ORDER — LISINOPRIL 5 MG PO TABS: 5.0000 mg | ORAL_TABLET | Freq: Every day | ORAL | 1 refills | Status: DC

## 2019-03-31 ENCOUNTER — Other Ambulatory Visit: Payer: Self-pay

## 2019-03-31 DIAGNOSIS — E1165 Type 2 diabetes mellitus with hyperglycemia: Secondary | ICD-10-CM

## 2019-03-31 MED ORDER — ACCU-CHEK SMARTVIEW VI STRP: ORAL_STRIP | 11 refills | Status: DC

## 2019-04-02 DIAGNOSIS — Z20828 Contact with and (suspected) exposure to other viral communicable diseases: Secondary | ICD-10-CM | POA: Diagnosis not present

## 2019-04-02 DIAGNOSIS — R05 Cough: Secondary | ICD-10-CM | POA: Diagnosis not present

## 2019-04-23 ENCOUNTER — Telehealth: Payer: Self-pay

## 2019-04-23 NOTE — Telephone Encounter (Signed)
LMOM FOR PATIENT TO SCREEN AND CONFIRM 04-25-19 OV.

## 2019-04-25 ENCOUNTER — Ambulatory Visit: Payer: Medicare HMO | Admitting: Nurse Practitioner

## 2019-04-29 ENCOUNTER — Telehealth: Payer: Self-pay

## 2019-04-29 NOTE — Telephone Encounter (Signed)
Confirmed 05-01-19 ov as virtual.

## 2019-05-01 ENCOUNTER — Other Ambulatory Visit: Payer: Self-pay

## 2019-05-01 ENCOUNTER — Ambulatory Visit (INDEPENDENT_AMBULATORY_CARE_PROVIDER_SITE_OTHER): Payer: Medicare HMO | Admitting: Nurse Practitioner

## 2019-05-01 ENCOUNTER — Encounter: Payer: Self-pay | Admitting: Nurse Practitioner

## 2019-05-01 VITALS — BP 130/79 | HR 88 | Ht 60.0 in

## 2019-05-01 DIAGNOSIS — I10 Essential (primary) hypertension: Secondary | ICD-10-CM | POA: Diagnosis not present

## 2019-05-01 DIAGNOSIS — J300 Vasomotor rhinitis: Secondary | ICD-10-CM

## 2019-05-01 DIAGNOSIS — Z794 Long term (current) use of insulin: Secondary | ICD-10-CM

## 2019-05-01 DIAGNOSIS — E1165 Type 2 diabetes mellitus with hyperglycemia: Secondary | ICD-10-CM | POA: Diagnosis not present

## 2019-05-01 MED ORDER — FLUTICASONE PROPIONATE 50 MCG/ACT NA SUSP: 2.0000 | Freq: Every day | NASAL | 1 refills | Status: DC

## 2019-05-01 NOTE — Progress Notes (Signed)
[facility] [place], [place] [address]  Internal MEDICINE  Telephone Visit  Patient Name: Tiffany Hawkins  [id number]  [id number]  Date of Service: 05/01/2019  I connected with the patient at 10:40am by telephone and verified the patients identity using two identifiers.   I discussed the limitations, risks, security and privacy concerns of performing an evaluation and management service by telephone and the availability of in person appointments. I also discussed with the patient that there may be a patient responsible charge related to the service.  The patient expressed understanding and agrees to proceed.    Chief Complaint  Patient presents with  . Telephone Assessment  . Telephone Screen  . Diabetes    BLOOD SUGAR 171: NOT FASTING... HAS BEEN MORE HUNGRY SINCE BEING TAKEN OFF OF OZEMPIC   . Hypertension  . Hyperlipidemia  . Cough    FLONASE HAS EASED HER COUGH AND WOULD LIKE RX     The patient has been contacted via telephone for follow up visit due to concerns for spread of novel coronavirus. The patient presents for follow up visit. She states that her blood sugars are not good. Was taken off of ozempic due to abdominal pain. She states that her abdominal pain is gone. States that her blood sugars are high. Yesterday, her blood sugar was 181. Today, it is 171. She states that she can get it down, however, she has to basically starve herself. She feels hungry all the time. She has started feeling dizzy all the time again.  She was also started on flonase due to chronic allergies, post nasal drip, and cough. She states this has helped. She states that she does need to have her own prescription for this.       Current Medication: Outpatient Encounter Medications as of 05/01/2019  Medication Sig  . amLODipine (NORVASC) 5 MG tablet Take 2 tablets (10 mg total) by mouth daily.  . Blood Glucose Calibration (ACCU-CHEK SMARTVIEW CONTROL VI) by In Vitro route.  Use as di  . citalopram (CELEXA) 20 MG tablet Take 20 mg by mouth daily. Take 2 tablets daily for depression  . fenofibrate 54 MG tablet Take 1 tablet (54 mg total) by mouth daily.  Marland Kitchen glucose blood (ACCU-CHEK SMARTVIEW) test strip Blood sugar checks TID DX E11.65  . ibuprofen (ADVIL) 800 MG tablet Take 1 tablet (800 mg total) by mouth every 8 (eight) hours as needed.  . insulin aspart protamine - aspart (NOVOLOG MIX 70/30 FLEXPEN) (70-30) 100 UNIT/ML FlexPen Inject 0.18 mLs (18 Units total) into the skin 2 (two) times daily.  Marland Kitchen lisinopril (ZESTRIL) 5 MG tablet Take 1 tablet (5 mg total) by mouth daily.  Marland Kitchen lovastatin (MEVACOR) 20 MG tablet TAKE 1 TABLET AT BEDTIME FOR HIGH CHOLESTEROL  . Multiple Vitamin (MULTIVITAMIN) tablet Take by mouth daily. TAKES 1/2 TABLET  . ondansetron (ZOFRAN ODT) 8 MG disintegrating tablet Take 1 tablet (8 mg total) by mouth 2 (two) times daily.  . ondansetron (ZOFRAN) 4 MG tablet Take 1 tablet (4 mg total) by mouth every 8 (eight) hours as needed for nausea or vomiting.  . pantoprazole (PROTONIX) 20 MG tablet Take 2 tablets (40 mg total) by mouth daily.  . sharps container 1 each by Does not apply route as needed. To use for insulin syringes and needles. Taking insulin at least twice daily.  E11.65  . fluticasone (FLONASE) 50 MCG/ACT nasal spray Place 2 sprays into both nostrils daily.   No facility-administered  encounter medications on file as of 05/01/2019.    Surgical History: Past Surgical History:  Procedure Laterality Date  . APPENDECTOMY    . BACK SURGERY    . CATARACT EXTRACTION W/PHACO Left 12/03/2017   Procedure: CATARACT EXTRACTION PHACO AND INTRAOCULAR LENS PLACEMENT ([place]) LEFT  DIABETIC;  Surgeon: Eulogio Bear, MD;  Location: [place];  Service: Ophthalmology;  Laterality: Left;  Diabetic - insulin  . CATARACT EXTRACTION W/PHACO Right 01/14/2018   Procedure: CATARACT EXTRACTION PHACO AND INTRAOCULAR LENS PLACEMENT ([place]) RIGHT DIABETIC;   Surgeon: Eulogio Bear, MD;  Location: [place];  Service: Ophthalmology;  Laterality: Right;  Diabetic - insulin  . CERVICAL DISC SURGERY    . CHOLECYSTECTOMY    . COLON SURGERY    . COLONOSCOPY    . COLONOSCOPY WITH PROPOFOL N/A 05/17/2015   Procedure: COLONOSCOPY WITH PROPOFOL;  Surgeon: Manya Silvas, MD;  Location: [facility] ENDOSCOPY;  Service: Endoscopy;  Laterality: N/A;  . COLONOSCOPY WITH PROPOFOL N/A 04/01/2018   Procedure: COLONOSCOPY WITH BIOPSY;  Surgeon: [name], MD;  Location: [place];  Service: Endoscopy;  Laterality: N/A;  Diabetic - insulin  . ESOPHAGOGASTRODUODENOSCOPY    . HERNIA REPAIR    . POLYPECTOMY N/A 04/01/2018   Procedure: POLYPECTOMY;  Surgeon: [name], MD;  Location: [place];  Service: Endoscopy;  Laterality: N/A;  . PORT A CATH INJECTION ([place] HX)     Port has been removed  . TUBAL LIGATION      Medical History: Past Medical History:  Diagnosis Date  . Anxiety   . Colon cancer ([place]) 10/12/2014   had chemo  . Diabetes mellitus without complication ([place])   . Hyperlipidemia   . Hypertension   . Lateral epicondylitis   . Personal history of chemotherapy   . Polio    childhood  . Sleep apnea    no CPAP, sx improved with wt loss    Family History: Family History  Problem Relation Age of Onset  . Alzheimer's disease Mother   . Heart disease Father   . Colon cancer Sister   . Colon cancer Cousin   . Colon cancer Cousin   . Breast cancer Neg Hx     Social History   Socioeconomic History  . Marital status: Married    Spouse name: Not on file  . Number of children: Not on file  . Years of education: Not on file  . Highest education level: Not on file  Occupational History  . Not on file  Tobacco Use  . Smoking status: Former Smoker    Packs/day: 1.00    Types: Cigarettes    Quit date: 04/17/1981    Years since quitting: 38.0  . Smokeless tobacco: Never Used  Substance and Sexual Activity   . Alcohol use: No  . Drug use: No  . Sexual activity: Never  Other Topics Concern  . Not on file  Social History Narrative  . Not on file   Social Determinants of Health   Financial Resource Strain:   . Difficulty of Paying Living Expenses: Not on file  Food Insecurity:   . Worried About Charity fundraiser in the Last Year: Not on file  . Ran Out of Food in the Last Year: Not on file  Transportation Needs:   . Lack of Transportation (Medical): Not on file  . Lack of Transportation (Non-Medical): Not on file  Physical Activity:   . Days of Exercise per Week: Not  on file  . Minutes of Exercise per Session: Not on file  Stress:   . Feeling of Stress : Not on file  Social Connections:   . Frequency of Communication with Friends and Family: Not on file  . Frequency of Social Gatherings with Friends and Family: Not on file  . Attends Religious Services: Not on file  . Active Member of Clubs or Organizations: Not on file  . Attends Archivist Meetings: Not on file  . Marital Status: Not on file  Intimate Partner Violence:   . Fear of Current or Ex-Partner: Not on file  . Emotionally Abused: Not on file  . Physically Abused: Not on file  . Sexually Abused: Not on file      Review of Systems  Constitutional: Negative for chills, fatigue and unexpected weight change.  HENT: Positive for congestion and postnasal drip. Negative for rhinorrhea, sneezing and sore throat.        These have improved since additions of flonase  Respiratory: Negative for cough, chest tightness, shortness of breath and wheezing.   Cardiovascular: Negative for chest pain and palpitations.  Gastrointestinal: Negative for abdominal pain, constipation, diarrhea, nausea and vomiting.       Abdominal pain and symptoms have resolved.   Endocrine: Negative for cold intolerance, heat intolerance, polydipsia and polyuria.       States that blood sugars are more elevated now. She is off ozempic. Now  taking humalog 70/30 mix at 18 units twice daily.   Musculoskeletal: Negative for arthralgias, back pain, joint swelling and neck pain.  Skin: Negative for rash.  Neurological: Positive for dizziness. Negative for tremors, numbness and headaches.  Hematological: Negative for adenopathy. Does not bruise/bleed easily.  Psychiatric/Behavioral: Negative for behavioral problems (Depression), sleep disturbance and suicidal ideas. The patient is nervous/anxious.    Today's Vitals   05/01/19 1000  BP: 130/79  Pulse: 88  Height: 5' (1.524 m)   Body mass index is 27.85 kg/m.  Observation/Objective:   The patient is alert and oriented. She is pleasant and answers all questions appropriately. Breathing is non-labored. She is in no acute distress at this time.    Assessment/Plan: 1. Type 2 diabetes mellitus with hyperglycemia, with long-term current use of insulin (HCC) Blood sugars running in high 100s of late. Will have her increase  Novolog 70/0 to 21 units twice daily. Monitor blood sugars closely. Check HgbA1c at next, in-office visit. Adjust dosing as indicated.   2. Vasomotor rhinitis Continue flonase nasal spray daily. New rx sent to [facility].  - fluticasone (FLONASE) 50 MCG/ACT nasal spray; Place 2 sprays into both nostrils daily.  Dispense: 48 g; Refill: 1  3. Essential hypertension wll managed. Continue bp medication as prescribed   General Counseling: dejia doxsee understanding of the findings of today's phone visit and agrees with plan of treatment. I have discussed any further diagnostic evaluation that may be needed or ordered today. We also reviewed her medications today. she has been encouraged to call the office with any questions or concerns that should arise related to todays visit.  Diabetes Counseling:  1. Addition of ACE inh/ ARB'S for nephroprotection. Microalbumin is updated  2. Diabetic foot care, prevention of complications. Podiatry consult 3.  Exercise and lose weight.  4. Diabetic eye examination, Diabetic eye exam is updated  5. Monitor blood sugar closlely. nutrition counseling.  6. Sign and symptoms of hypoglycemia including shaking sweating,confusion and headaches.  This patient was seen by Leretha Pol FNP  Collaboration with Dr Lavera Guise as a part of collaborative care agreement  Meds ordered this encounter  Medications  . fluticasone (FLONASE) 50 MCG/ACT nasal spray    Sig: Place 2 sprays into both nostrils daily.    Dispense:  48 g    Refill:  1    Order Specific Question:   Supervising Provider    Answer:   Lavera Guise [[id number]]    Time spent: 25 Minutes  Time spent with patient included reviewing progress notes, labs, imaging studies, and discussing plan for follow up.    Dr Lavera Guise Internal medicine

## 2019-05-09 ENCOUNTER — Ambulatory Visit
Admission: EM | Admit: 2019-05-09 | Discharge: 2019-05-09 | Disposition: A | Discharge status: Home / Self Care | Payer: Medicare HMO | Attending: Family Medicine | Admitting: Family Medicine

## 2019-05-09 ENCOUNTER — Encounter: Payer: Self-pay | Admitting: Emergency Medicine

## 2019-05-09 ENCOUNTER — Other Ambulatory Visit: Payer: Self-pay

## 2019-05-09 ENCOUNTER — Ambulatory Visit (INDEPENDENT_AMBULATORY_CARE_PROVIDER_SITE_OTHER): Payer: Medicare HMO

## 2019-05-09 DIAGNOSIS — W19XXXA Unspecified fall, initial encounter: Secondary | ICD-10-CM

## 2019-05-09 DIAGNOSIS — M545 Low back pain, unspecified: Secondary | ICD-10-CM

## 2019-05-09 DIAGNOSIS — S3992XA Unspecified injury of lower back, initial encounter: Secondary | ICD-10-CM | POA: Diagnosis not present

## 2019-05-09 DIAGNOSIS — M533 Sacrococcygeal disorders, not elsewhere classified: Secondary | ICD-10-CM | POA: Diagnosis not present

## 2019-05-09 MED ORDER — METAXALONE 800 MG PO TABS: 800.0000 mg | ORAL_TABLET | Freq: Two times a day (BID) | ORAL | 0 refills | Status: DC | PRN

## 2019-05-09 MED ORDER — NAPROXEN 500 MG PO TABS: 500.0000 mg | ORAL_TABLET | Freq: Two times a day (BID) | ORAL | 0 refills | Status: DC | PRN

## 2019-05-09 NOTE — ED Triage Notes (Signed)
Patient states that she fell 2 days ago at her daughter's house.  Patient states that when she stepped up on the stoop she missed it and fell down on her her buttock.  Patient c/o lower back that has been going on for a week but since her fall her pain has been worse.  Patient denies LOC.

## 2019-05-09 NOTE — Discharge Instructions (Addendum)
Recommend start Naproxen 500mg  twice a day with food as needed for pain and inflammation. May take Skelaxin muscle relaxer 1 tablet every 8 to 12 hours as needed for muscle spasms and pain- may cause drowsiness- encouraged to take mainly at night. Apply warm compresses to area for comfort. Avoid lifting for the next few days. Use support (walking stick/cane) when moving around, especially when traveling to your family's homes. Follow-up with your PCP in 3 to 4 days if not improving.

## 2019-05-09 NOTE — ED Provider Notes (Signed)
[facility]    CSN: [id number] Arrival date & time: 05/09/19  1035      History   Chief Complaint Chief Complaint  Patient presents with  . Back Pain  . Fall    HPI Tiffany Hawkins is a 74 y.o. female.   74 year old female presents with injury to her lower back/buttock. She was outside her daughter's house when she missed the stoop and did not have anything to balance or grab onto. She fell backwards, landing on her lower back and buttock, against the brick and concrete. She did not hit her head or lose consciousness. She was able to get up and ambulate with assistance but the pain continues to get worse. She has noticed increase pain with changing positions today. She has intermittent numbness down both legs and pain is radiating mostly up right side of back. She took Ibuprofen last night with some help. She is a diabetic and her sugars have been under poor control (fastings levels per patient in upper 100's) due to stopping Ozempic due to side effects of diabetic medication (pancreatitis). She is more light-headed and unstable on her feet lately. She has a cane but often does not use it for support. Other chronic health issues include HTN, hyperlipidemia, intermittent mild chronic kidney disease and history of colon cancer. Currently on Norvasc, Lisinopril, Lovastatin, Fenofibrate, Novolog Insulin, Protonix, Celexa, Flonase daily and Ibuprofen prn.   The history is provided by the patient.    Past Medical History:  Diagnosis Date  . Anxiety   . Colon cancer ([place]) 10/12/2014   had chemo  . Diabetes mellitus without complication ([place])   . Hyperlipidemia   . Hypertension   . Lateral epicondylitis   . Personal history of chemotherapy   . Polio    childhood  . Sleep apnea    no CPAP, sx improved with wt loss    Patient Active Problem List   Diagnosis Date Noted  . 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 Chronic kidney disease, stage II (mild) 03/14/2019  .  Gastroesophageal reflux disease without esophagitis 03/14/2019  . Intractable episodic headache 03/14/2019  . Need for prophylactic vaccination with combined diphtheria-tetanus-pertussis (DTaP) vaccine 03/14/2019  . Renal cyst 11/27/2018  . Abnormality of gait and mobility 11/27/2018  . Hepatic steatosis 10/31/2018  . 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[place]) 10/31/2018  . Elevated ferritin 10/31/2018  . Dizziness 08/13/2018  . Gastroenteritis 05/09/2018  . Diarrhea 05/09/2018  . Nausea 05/09/2018  . Personal history of colon cancer   . Polyp of sigmoid colon   . Dysuria 02/23/2018  . 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 Encounter for general adult medical examination with abnormal findings 02/10/2018  . Acute non-recurrent pansinusitis 08/24/2017  . Other headache syndrome 08/24/2017  . Uncontrolled type 2 diabetes mellitus with hyperglycemia ([place]) 08/24/2017  . Type 2 diabetes mellitus with hyperglycemia, with long-term current use of insulin ([place]) 08/22/2017  . Need for vaccination against Streptococcus pneumoniae using pneumococcal conjugate vaccine 13 08/22/2017  . Essential hypertension 05/03/2017  . Otalgia, bilateral 05/02/2017  . Type 2 diabetes mellitus with hyperglycemia ([place]) 05/02/2017  . Mixed hyperlipidemia 05/02/2017  . Right lower quadrant pain 06/29/2016  . 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 History of colon cancer 04/13/2015  . Cancer of ascending colon ([place]) 10/12/2014    Past Surgical History:  Procedure Laterality Date  . APPENDECTOMY    . BACK SURGERY    .  CATARACT EXTRACTION W/PHACO Left 12/03/2017   Procedure: CATARACT EXTRACTION PHACO AND INTRAOCULAR LENS PLACEMENT ([place]) LEFT  DIABETIC;  Surgeon: Eulogio Bear, MD;  Location: [place];  Service: Ophthalmology;  Laterality: Left;  Diabetic - insulin  . CATARACT EXTRACTION W/PHACO Right 01/14/2018   Procedure: CATARACT  EXTRACTION PHACO AND INTRAOCULAR LENS PLACEMENT ([place]) RIGHT DIABETIC;  Surgeon: Eulogio Bear, MD;  Location: [place];  Service: Ophthalmology;  Laterality: Right;  Diabetic - insulin  . CERVICAL DISC SURGERY    . CHOLECYSTECTOMY    . COLON SURGERY    . COLONOSCOPY    . COLONOSCOPY WITH PROPOFOL N/A 05/17/2015   Procedure: COLONOSCOPY WITH PROPOFOL;  Surgeon: Manya Silvas, MD;  Location: [facility]COPY;  Service: Endoscopy;  Laterality: N/A;  . COLONOSCOPY WITH PROPOFOL N/A 04/01/2018   Procedure: COLONOSCOPY WITH BIOPSY;  Surgeon: [name], MD;  Location: [place];  Service: Endoscopy;  Laterality: N/A;  Diabetic - insulin  . ESOPHAGOGASTRODUODENOSCOPY    . HERNIA REPAIR    . POLYPECTOMY N/A 04/01/2018   Procedure: POLYPECTOMY;  Surgeon: [name], MD;  Location: [place];  Service: Endoscopy;  Laterality: N/A;  . PORT A CATH INJECTION ([place] [facility])     Port has been removed  . TUBAL LIGATION      OB History    Gravida  2   Para  2   Term  1   Preterm  1   AB      Living  2     SAB      TAB      Ectopic      Multiple      Live Births               Home Medications    Prior to Admission medications   Medication Sig Start Date End Date Taking? Authorizing Provider  amLODipine (NORVASC) 5 MG tablet Take 2 tablets (10 mg total) by mouth daily. 03/03/19  Yes Lavera Guise, MD  Blood Glucose Calibration (ACCU-CHEK SMARTVIEW CONTROL VI) by In Vitro route. Use as di   Yes [provider]  citalopram (CELEXA) 20 MG tablet Take 20 mg by mouth daily. Take 2 tablets daily for depression   Yes [provider]  fenofibrate 54 MG tablet Take 1 tablet (54 mg total) by mouth daily. 10/10/18  Yes Boscia, Heather E, NP  fluticasone (FLONASE) 50 MCG/ACT nasal spray Place 2 sprays into both nostrils daily. 05/01/19  Yes Boscia, Greer Ee, NP  ibuprofen (ADVIL) 800 MG tablet Take 1 tablet (800 mg total) by mouth every 8  (eight) hours as needed. 02/24/19  Yes Boscia, Heather E, NP  insulin aspart protamine - aspart (NOVOLOG MIX 70/30 FLEXPEN) (70-30) 100 UNIT/ML FlexPen Inject 0.18 mLs (18 Units total) into the skin 2 (two) times daily. 10/31/18  Yes Boscia, Heather E, NP  lisinopril (ZESTRIL) 5 MG tablet Take 1 tablet (5 mg total) by mouth daily. 03/19/19  Yes Ronnell Freshwater, NP  lovastatin (MEVACOR) 20 MG tablet TAKE 1 TABLET AT BEDTIME FOR HIGH CHOLESTEROL 01/09/19  Yes Boscia, Heather E, NP  Multiple Vitamin (MULTIVITAMIN) tablet Take by mouth daily. TAKES 1/2 TABLET   Yes [provider]  pantoprazole (PROTONIX) 20 MG tablet Take 2 tablets (40 mg total) by mouth daily. 02/24/19  Yes Ronnell Freshwater, NP  glucose blood (ACCU-CHEK SMARTVIEW) test strip Blood sugar checks TID DX E11.65 03/31/19   Ronnell Freshwater, NP  metaxalone (SKELAXIN) 800 MG tablet Take 1 tablet (800 mg total) by mouth 2 (two) times daily as needed for muscle spasms. 05/09/19   Katy Apo, NP  naproxen (NAPROSYN) 500 MG tablet Take 1 tablet (500 mg total) by mouth 2 (two) times daily as needed for moderate pain. 05/09/19   Katy Apo, NP  ondansetron (ZOFRAN ODT) 8 MG disintegrating tablet Take 1 tablet (8 mg total) by mouth 2 (two) times daily. 05/30/18   Lorin Picket, PA-C  ondansetron (ZOFRAN) 4 MG tablet Take 1 tablet (4 mg total) by mouth every 8 (eight) hours as needed for nausea or vomiting. 05/09/18   Ronnell Freshwater, NP  sharps container 1 each by Does not apply route as needed. To use for insulin syringes and needles. Taking insulin at least twice daily.  E11.65 11/06/17   Ronnell Freshwater, NP    Family History Family History  Problem Relation Age of Onset  . Alzheimer's disease Mother   . Heart disease Father   . Colon cancer Sister   . Colon cancer Cousin   . Colon cancer Cousin   . Breast cancer Neg Hx     Social History Social History   Tobacco Use  . Smoking status: Former Smoker     Packs/day: 1.00    Types: Cigarettes    Quit date: 04/17/1981    Years since quitting: 38.0  . Smokeless tobacco: Never Used  Substance Use Topics  . Alcohol use: No  . Drug use: No     Allergies   Patient has no known allergies.   Review of Systems Review of Systems  Constitutional: Negative for activity change, appetite change, chills and fever.  Eyes: Negative for photophobia and visual disturbance.  Respiratory: Negative for chest tightness, shortness of breath and wheezing.   Cardiovascular: Negative for chest pain and palpitations.  Gastrointestinal: Negative for nausea and vomiting.  Genitourinary: Negative for decreased urine volume, difficulty urinating, dysuria, flank pain, hematuria and pelvic pain.  Musculoskeletal: Positive for arthralgias, back pain, gait problem and myalgias.  Skin: Negative for color change, rash and wound.  Neurological: Positive for dizziness, light-headedness and numbness. Negative for tremors, seizures, syncope, facial asymmetry and headaches.  Hematological: Negative for adenopathy. Does not bruise/bleed easily.     Physical Exam Triage Vital Signs ED Triage Vitals  Enc Vitals Group     BP 05/09/19 1056 135/74     Pulse Rate 05/09/19 1056 99     Resp 05/09/19 1056 16     Temp 05/09/19 1056 98.2 F (36.8 C)     Temp Source 05/09/19 1056 Oral     SpO2 05/09/19 1056 98 %     Weight 05/09/19 1051 148 lb (67.1 kg)     Height 05/09/19 1051 5\' 2"  (1.575 m)     Head Circumference --      Peak Flow --      Pain Score 05/09/19 1051 9     Pain Loc --      Pain Edu? --      Excl. in [place]? --    No data found.  Updated Vital Signs BP 135/74 (BP Location: Left Arm)   Pulse 99   Temp 98.2 F (36.8 C) (Oral)   Resp 16   Ht 5\' 2"  (1.575 m)   Wt 148 lb (67.1 kg)   SpO2 98%   BMI 27.07 kg/m   Visual Acuity Right Eye Distance:   Left Eye Distance:  Bilateral Distance:    Right Eye Near:   Left Eye Near:    Bilateral Near:      Physical Exam Vitals and nursing note reviewed.  Constitutional:      General: She is awake. She is not in acute distress.    Appearance: Normal appearance. She is well-developed and well-groomed.     Comments: Patient sitting in exam chair in no acute distress but has significant increase in pain when changing positions.   HENT:     Head: Normocephalic and atraumatic.     Right Ear: External ear normal.     Left Ear: External ear normal.  Eyes:     Extraocular Movements: Extraocular movements intact.     Conjunctiva/sclera: Conjunctivae normal.  Cardiovascular:     Rate and Rhythm: Normal rate and regular rhythm.     Heart sounds: Normal heart sounds.  Pulmonary:     Effort: Pulmonary effort is normal. No prolonged expiration.     Breath sounds: Normal breath sounds and air entry. No decreased breath sounds, wheezing, rhonchi or rales.  Musculoskeletal:        General: Tenderness present. No swelling.     Cervical back: Normal and normal range of motion.     Thoracic back: Scoliosis (mild) present.     Lumbar back: Tenderness present. No swelling, edema, deformity, signs of trauma or lacerations. Decreased range of motion. Positive right straight leg raise test and positive left straight leg raise test. No scoliosis.       Back:     Right lower leg: No edema.     Left lower leg: No edema.     Comments: Decreased range of motion of back- especially with hyperextension and rotation. Very difficult for patient to change positions due to pain. Tender along lower lumbar and upper sacral area bilaterally with more pain on right lower side. No bruising or redness present. No distinct neuro deficits noted. Good distal pulses.   Skin:    General: Skin is warm and dry.     Capillary Refill: Capillary refill takes less than 2 seconds.     Findings: No bruising, ecchymosis or rash.  Neurological:     General: No focal deficit present.     Mental Status: She is alert and oriented to  person, place, and time.     Sensory: Sensation is intact.     Motor: Motor function is intact.     Gait: Gait abnormal.     Comments: Patient has slight difficulty with gait due to lightheadedness and instability. She needs assistance changing positions as well as with walking.   Psychiatric:        Attention and Perception: Attention normal.        Mood and Affect: Mood normal.        Behavior: Behavior normal. Behavior is cooperative.        Thought Content: Thought content normal.        Cognition and Memory: Cognition normal.      UC Treatments / Results  Labs (all labs ordered are listed, but only abnormal results are displayed) Labs Reviewed - No data to display  EKG   Radiology DG Lumbar Spine Complete  Result Date: 05/09/2019 CLINICAL DATA:  74 year old female with a history of lower back pain EXAM: LUMBAR SPINE - COMPLETE 4+ VIEW COMPARISON:  None. FINDINGS: Lumbar Spine: Lumbar vertebral elements maintain normal alignment without evidence of anterolisthesis, retrolisthesis, subluxation. No acute fracture line identified. Vertebral body heights  maintained. Anterior osteophyte production the lower thoracic spine and the lumbar spine. Mild endplate changes of sclerosis. The most significant facet hypertrophy is at L5-S1 and to a lesser degree L4-L5. Osteopenia. Calcifications of the aorta. Oblique images demonstrate no displaced pars defect Surgical changes of the right abdomen IMPRESSION: Negative for acute fracture or malalignment of the lumbar spine. Mild degenerative changes as above. Aortic atherosclerosis Electronically Signed   By: Corrie Mckusick D.O.   On: 05/09/2019 12:44   DG Sacrum/Coccyx  Result Date: 05/09/2019 CLINICAL DATA:  Fall.  Pain. EXAM: SACRUM AND COCCYX - 2+ VIEW COMPARISON:  None. FINDINGS: There is no evidence of fracture or other focal bone lesions. IMPRESSION: Negative. Electronically Signed   By: Misty Stanley M.D.   On: 05/09/2019 12:24     Procedures Procedures (including critical care time)  Medications Ordered in UC Medications - No data to display  Initial Impression / Assessment and Plan / UC Course  I have reviewed the triage vital signs and the nursing notes.  Pertinent labs & imaging results that were available during my care of the patient were reviewed by me and considered in my medical decision making (see chart for details).    Reviewed x-ray results with patient- no fracture or malalignment. Degenerative changes present. Discussed that she probably stretched ligaments and muscles in her lower back. Reviewed recent labwork (Nov 2020) in which kidney function was improving since stopping Ozempic. Discussed limited used of NSAID to help with pain and inflammation. Recommend Naproxen 500mg  twice a day as directed. May take Skelaxin 800mg  every 8 to 12 hours as needed but will cause drowsiness and can contribute to dizziness so encouraged to take sparingly and mainly at night if needed. Apply warm compresses to area for comfort. Avoid lifting (grandchildren and anything over 5 lbs) for the next few days. Recommend use assistive support (walking stick or cane) when moving around to improve stability and decrease chance of repeat falling. Recommend follow-up with her PCP in 3 to 4 days if not improving.   Final Clinical Impressions(s) / UC Diagnoses   Final diagnoses:  Acute bilateral low back pain without sciatica  Fall, initial encounter     Discharge Instructions     Recommend start Naproxen 500mg  twice a day with food as needed for pain and inflammation. May take Skelaxin muscle relaxer 1 tablet every 8 to 12 hours as needed for muscle spasms and pain- may cause drowsiness- encouraged to take mainly at night. Apply warm compresses to area for comfort. Avoid lifting for the next few days. Use support (walking stick/cane) when moving around, especially when traveling to your family's homes. Follow-up with your PCP  in 3 to 4 days if not improving.     ED Prescriptions    Medication Sig Dispense Auth. Provider   naproxen (NAPROSYN) 500 MG tablet Take 1 tablet (500 mg total) by mouth 2 (two) times daily as needed for moderate pain. 20 tablet Katy Apo, NP   metaxalone (SKELAXIN) 800 MG tablet Take 1 tablet (800 mg total) by mouth 2 (two) times daily as needed for muscle spasms. 21 tablet Angela Platner, Nicholes Stairs, NP     PDMP not reviewed this encounter.   Katy Apo, NP 05/10/19 0830

## 2019-05-12 ENCOUNTER — Other Ambulatory Visit: Payer: Self-pay

## 2019-05-12 DIAGNOSIS — K219 Gastro-esophageal reflux disease without esophagitis: Secondary | ICD-10-CM

## 2019-05-12 MED ORDER — PANTOPRAZOLE SODIUM 20 MG PO TBEC: 40.0000 mg | DELAYED_RELEASE_TABLET | Freq: Every day | ORAL | 1 refills | Status: DC

## 2019-05-24 ENCOUNTER — Other Ambulatory Visit: Payer: Self-pay

## 2019-05-24 ENCOUNTER — Encounter: Payer: Self-pay | Admitting: Emergency Medicine

## 2019-05-24 ENCOUNTER — Ambulatory Visit
Admission: EM | Admit: 2019-05-24 | Discharge: 2019-05-24 | Disposition: A | Discharge status: Home / Self Care | Payer: Medicare HMO | Attending: Internal Medicine | Admitting: Internal Medicine

## 2019-05-24 DIAGNOSIS — Z794 Long term (current) use of insulin: Secondary | ICD-10-CM | POA: Diagnosis not present

## 2019-05-24 DIAGNOSIS — Z8249 Family history of ischemic heart disease and other diseases of the circulatory system: Secondary | ICD-10-CM | POA: Diagnosis not present

## 2019-05-24 DIAGNOSIS — N182 Chronic kidney disease, stage 2 (mild): Secondary | ICD-10-CM | POA: Diagnosis not present

## 2019-05-24 DIAGNOSIS — Z85038 Personal history of other malignant neoplasm of large intestine: Secondary | ICD-10-CM | POA: Diagnosis not present

## 2019-05-24 DIAGNOSIS — Z20822 Contact with and (suspected) exposure to covid-19: Secondary | ICD-10-CM | POA: Insufficient documentation

## 2019-05-24 DIAGNOSIS — G4730 Sleep apnea, unspecified: Secondary | ICD-10-CM | POA: Diagnosis not present

## 2019-05-24 DIAGNOSIS — Z79899 Other long term (current) drug therapy: Secondary | ICD-10-CM | POA: Diagnosis not present

## 2019-05-24 DIAGNOSIS — J0100 Acute maxillary sinusitis, unspecified: Secondary | ICD-10-CM | POA: Insufficient documentation

## 2019-05-24 DIAGNOSIS — E1122 Type 2 diabetes mellitus with diabetic chronic kidney disease: Secondary | ICD-10-CM | POA: Diagnosis not present

## 2019-05-24 DIAGNOSIS — Z87891 Personal history of nicotine dependence: Secondary | ICD-10-CM | POA: Insufficient documentation

## 2019-05-24 DIAGNOSIS — E782 Mixed hyperlipidemia: Secondary | ICD-10-CM | POA: Insufficient documentation

## 2019-05-24 DIAGNOSIS — K219 Gastro-esophageal reflux disease without esophagitis: Secondary | ICD-10-CM | POA: Diagnosis not present

## 2019-05-24 DIAGNOSIS — I129 Hypertensive chronic kidney disease with stage 1 through stage 4 chronic kidney disease, or unspecified chronic kidney disease: Secondary | ICD-10-CM | POA: Insufficient documentation

## 2019-05-24 DIAGNOSIS — R05 Cough: Secondary | ICD-10-CM | POA: Diagnosis present

## 2019-05-24 MED ORDER — GUAIFENESIN ER 600 MG PO TB12: 600.0000 mg | ORAL_TABLET | Freq: Two times a day (BID) | ORAL | 0 refills | Status: AC

## 2019-05-24 MED ORDER — AMOXICILLIN 500 MG PO CAPS: 500.0000 mg | ORAL_CAPSULE | Freq: Three times a day (TID) | ORAL | 0 refills | Status: DC

## 2019-05-24 MED ORDER — BENZONATATE 100 MG PO CAPS: 100.0000 mg | ORAL_CAPSULE | Freq: Three times a day (TID) | ORAL | 0 refills | Status: DC

## 2019-05-24 MED ORDER — CETIRIZINE HCL 10 MG PO CHEW: 10.0000 mg | CHEWABLE_TABLET | Freq: Every day | ORAL | 1 refills | Status: DC

## 2019-05-24 NOTE — ED Provider Notes (Signed)
[facility]    CSN: [id number] Arrival date & time: 05/24/19  1008      History   Chief Complaint Chief Complaint  Patient presents with  . Cough  . Sinus Problem    HPI Tiffany Hawkins is a 74 y.o. female with a history of hypertension comes to urgent care with at least a 7 day history of nasal congestion, yellowish nasal discharge.  Patient symptoms started insidiously and is gotten progressively worse.  Initially patient had clear nasal discharge.  She is progressed to have coughing.  She has not tried any over-the-counter medications.  No fever or chills.  She has pressure and some pain over the maxillary sinuses.  No eye redness or pain with moving eyeball.  Patient denies any shortness of breath, wheezing but has a cough which is productive of yellowish sputum.   No sick contacts.  HPI  Past Medical History:  Diagnosis Date  . Anxiety   . Colon cancer ([place]) 10/12/2014   had chemo  . Diabetes mellitus without complication ([place])   . Hyperlipidemia   . Hypertension   . Lateral epicondylitis   . Personal history of chemotherapy   . Polio    childhood  . Sleep apnea    no CPAP, sx improved with wt loss    Patient Active Problem List   Diagnosis Date Noted  . 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 Chronic kidney disease, stage II (mild) 03/14/2019  . Gastroesophageal reflux disease without esophagitis 03/14/2019  . Intractable episodic headache 03/14/2019  . Need for prophylactic vaccination with combined diphtheria-tetanus-pertussis (DTaP) vaccine 03/14/2019  . Renal cyst 11/27/2018  . Abnormality of gait and mobility 11/27/2018  . Hepatic steatosis 10/31/2018  . 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 Elevated ferritin 10/31/2018  . Dizziness 08/13/2018  . Gastroenteritis 05/09/2018  . Diarrhea 05/09/2018  . Nausea 05/09/2018  . Personal history of colon cancer   . Polyp of sigmoid colon   . Dysuria 02/23/2018  . 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 Encounter for general adult medical examination with abnormal findings 02/10/2018  . Acute non-recurrent pansinusitis 08/24/2017  . Other headache syndrome 08/24/2017  . Uncontrolled type 2 diabetes mellitus with hyperglycemia ([place]) 08/24/2017  . Type 2 diabetes mellitus with hyperglycemia, with long-term current use of insulin ([place]) 08/22/2017  . Need for vaccination against Streptococcus pneumoniae using pneumococcal conjugate vaccine 13 08/22/2017  . Essential hypertension 05/03/2017  . Otalgia, bilateral 05/02/2017  . Type 2 diabetes mellitus with hyperglycemia ([place]) 05/02/2017  . Mixed hyperlipidemia 05/02/2017  . Right lower quadrant pain 06/29/2016  . 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 History of colon cancer 04/13/2015  . Cancer of ascending colon ([place]) 10/12/2014    Past Surgical History:  Procedure Laterality Date  . APPENDECTOMY    . BACK SURGERY    . CATARACT EXTRACTION W/PHACO Left 12/03/2017   Procedure: CATARACT EXTRACTION PHACO AND INTRAOCULAR LENS PLACEMENT ([place]) LEFT  DIABETIC;  Surgeon: Eulogio Bear, MD;  Location: [facility];  Service: Ophthalmology;  Laterality: Left;  Diabetic - insulin  . CATARACT EXTRACTION W/PHACO Right 01/14/2018   Procedure: CATARACT EXTRACTION PHACO AND INTRAOCULAR LENS PLACEMENT ([place]) RIGHT DIABETIC;  Surgeon: Eulogio Bear, MD;  Location: [place];  Service: Ophthalmology;  Laterality: Right;  Diabetic - insulin  . CERVICAL DISC SURGERY    . CHOLECYSTECTOMY    . COLON SURGERY    .  COLONOSCOPY    . COLONOSCOPY WITH PROPOFOL N/A 05/17/2015   Procedure: COLONOSCOPY WITH PROPOFOL;  Surgeon: Manya Silvas, MD;  Location[facility];  Service: Endoscopy;  Laterality: N/A;  . COLONOSCOPY WITH PROPOFOL N/A 04/01/2018   Procedure: COLONOSCOPY WITH BIOPSY;  Surgeon: [name], MD;  Location: [place];  Service: Endoscopy;   Laterality: N/A;  Diabetic - insulin  . ESOPHAGOGASTRODUODENOSCOPY    . HERNIA REPAIR    . POLYPECTOMY N/A 04/01/2018   Procedure: POLYPECTOMY;  Surgeon: [name], MD;  Location: [place];  Service: Endoscopy;  Laterality: N/A;  . PORT A CATH INJECTION ([place] [facility])     Port has been removed  . TUBAL LIGATION      OB History    Gravida  2   Para  2   Term  1   Preterm  1   AB      Living  2     SAB      TAB      Ectopic      Multiple      Live Births               Home Medications    Prior to Admission medications   Medication Sig Start Date End Date Taking? Authorizing Provider  amLODipine (NORVASC) 5 MG tablet Take 2 tablets (10 mg total) by mouth daily. 03/03/19  Yes Lavera Guise, MD  citalopram (CELEXA) 20 MG tablet Take 20 mg by mouth daily. Take 2 tablets daily for depression   Yes [provider]  fenofibrate 54 MG tablet Take 1 tablet (54 mg total) by mouth daily. 10/10/18  Yes Boscia, Heather E, NP  fluticasone (FLONASE) 50 MCG/ACT nasal spray Place 2 sprays into both nostrils daily. 05/01/19  Yes Boscia, Greer Ee, NP  ibuprofen (ADVIL) 800 MG tablet Take 1 tablet (800 mg total) by mouth every 8 (eight) hours as needed. 02/24/19  Yes Boscia, Heather E, NP  insulin aspart protamine - aspart (NOVOLOG MIX 70/30 FLEXPEN) (70-30) 100 UNIT/ML FlexPen Inject 0.18 mLs (18 Units total) into the skin 2 (two) times daily. 10/31/18  Yes Boscia, Heather E, NP  lisinopril (ZESTRIL) 5 MG tablet Take 1 tablet (5 mg total) by mouth daily. 03/19/19  Yes Ronnell Freshwater, NP  lovastatin (MEVACOR) 20 MG tablet TAKE 1 TABLET AT BEDTIME FOR HIGH CHOLESTEROL 01/09/19  Yes Boscia, Heather E, NP  Multiple Vitamin (MULTIVITAMIN) tablet Take by mouth daily. TAKES 1/2 TABLET   Yes [provider]  pantoprazole (PROTONIX) 20 MG tablet Take 2 tablets (40 mg total) by mouth daily. 05/12/19  Yes Boscia, Greer Ee, NP  amoxicillin (AMOXIL) 500 MG capsule Take 1  capsule (500 mg total) by mouth 3 (three) times daily. 05/24/19   Roseline Ebarb, Myrene Galas, MD  benzonatate (TESSALON) 100 MG capsule Take 1 capsule (100 mg total) by mouth every 8 (eight) hours. 05/24/19   Laveda Demedeiros, Myrene Galas, MD  Blood Glucose Calibration (ACCU-CHEK SMARTVIEW CONTROL VI) by In Vitro route. Use as di    [provider]  cetirizine (ZYRTEC) 10 MG chewable tablet Chew 1 tablet (10 mg total) by mouth daily. 05/24/19   Chase Picket, MD  glucose blood (ACCU-CHEK SMARTVIEW) test strip Blood sugar checks TID DX E11.65 03/31/19   Ronnell Freshwater, NP  guaiFENesin (MUCINEX) 600 MG 12 hr tablet Take 1 tablet (600 mg total) by mouth 2 (two) times daily for 10 days. 05/24/19 06/03/19  Jamse Arn  O, MD  metaxalone (SKELAXIN) 800 MG tablet Take 1 tablet (800 mg total) by mouth 2 (two) times daily as needed for muscle spasms. 05/09/19   Katy Apo, NP  naproxen (NAPROSYN) 500 MG tablet Take 1 tablet (500 mg total) by mouth 2 (two) times daily as needed for moderate pain. 05/09/19   Katy Apo, NP  ondansetron (ZOFRAN ODT) 8 MG disintegrating tablet Take 1 tablet (8 mg total) by mouth 2 (two) times daily. 05/30/18   Lorin Picket, PA-C  ondansetron (ZOFRAN) 4 MG tablet Take 1 tablet (4 mg total) by mouth every 8 (eight) hours as needed for nausea or vomiting. 05/09/18   Ronnell Freshwater, NP  sharps container 1 each by Does not apply route as needed. To use for insulin syringes and needles. Taking insulin at least twice daily.  E11.65 11/06/17   Ronnell Freshwater, NP    Family History Family History  Problem Relation Age of Onset  . Alzheimer's disease Mother   . Heart disease Father   . Colon cancer Sister   . Colon cancer Cousin   . Colon cancer Cousin   . Breast cancer Neg Hx     Social History Social History   Tobacco Use  . Smoking status: Former Smoker    Packs/day: 1.00    Types: Cigarettes    Quit date: 04/17/1981    Years since quitting: 38.1  . Smokeless  tobacco: Never Used  Substance Use Topics  . Alcohol use: No  . Drug use: No     Allergies   Patient has no known allergies.   Review of Systems Review of Systems  Constitutional: Positive for activity change. Negative for chills, fatigue and fever.  HENT: Negative.   Eyes: Positive for itching. Negative for pain, redness and visual disturbance.  Respiratory: Positive for cough. Negative for chest tightness, shortness of breath and wheezing.   Cardiovascular: Negative.   Gastrointestinal: Negative for diarrhea, nausea and vomiting.  Musculoskeletal: Negative.  Negative for arthralgias and myalgias.  Neurological: Positive for dizziness and headaches. Negative for weakness and light-headedness.  Psychiatric/Behavioral: Negative for confusion and decreased concentration.     Physical Exam Triage Vital Signs ED Triage Vitals  Enc Vitals Group     BP 05/24/19 1032 118/76     Pulse Rate 05/24/19 1032 90     Resp 05/24/19 1032 16     Temp 05/24/19 1032 98.4 F (36.9 C)     Temp Source 05/24/19 1032 Oral     SpO2 05/24/19 1032 97 %     Weight 05/24/19 1027 140 lb (63.5 kg)     Height 05/24/19 1027 5\' 2"  (1.575 m)     Head Circumference --      Peak Flow --      Pain Score 05/24/19 1026 5     Pain Loc --      Pain Edu? --      Excl. in [place]? --    No data found.  Updated Vital Signs BP 118/76 (BP Location: Right Arm)   Pulse 90   Temp 98.4 F (36.9 C) (Oral)   Resp 16   Ht 5\' 2"  (1.575 m)   Wt 63.5 kg   SpO2 97%   BMI 25.61 kg/m   Visual Acuity Right Eye Distance:   Left Eye Distance:   Bilateral Distance:    Right Eye Near:   Left Eye Near:    Bilateral Near:     Physical  Exam Vitals and nursing note reviewed.  Constitutional:      Appearance: Normal appearance. She is ill-appearing.  HENT:     Right Ear: Tympanic membrane and ear canal normal.     Left Ear: Tympanic membrane and ear canal normal.     Nose: Congestion present.     Comments:  Tenderness over the maxillary sinuses bilaterally.    Mouth/Throat:     Mouth: Mucous membranes are moist.     Pharynx: No oropharyngeal exudate or posterior oropharyngeal erythema.  Eyes:     Conjunctiva/sclera: Conjunctivae normal.  Cardiovascular:     Rate and Rhythm: Normal rate and regular rhythm.     Pulses: Normal pulses.     Heart sounds: Normal heart sounds.  Pulmonary:     Effort: Pulmonary effort is normal.     Breath sounds: Normal breath sounds.  Abdominal:     General: Bowel sounds are normal. There is no distension.     Palpations: Abdomen is soft.     Tenderness: There is no guarding or rebound.  Musculoskeletal:        General: No swelling, deformity or signs of injury. Normal range of motion.  Skin:    Capillary Refill: Capillary refill takes less than 2 seconds.  Neurological:     General: No focal deficit present.     Mental Status: She is alert and oriented to person, place, and time.      UC Treatments / Results  Labs (all labs ordered are listed, but only abnormal results are displayed) Labs Reviewed  NOVEL CORONAVIRUS, NAA (HOSP ORDER, SEND-OUT TO REF LAB; TAT 18-24 HRS)    EKG   Radiology No results found.  Procedures Procedures (including critical care time)  Medications Ordered in UC Medications - No data to display  Initial Impression / Assessment and Plan / UC Course  I have reviewed the triage vital signs and the nursing notes.  Pertinent labs & imaging results that were available during my care of the patient were reviewed by me and considered in my medical decision making (see chart for details).     1.  Acute nonrecurrent maxillary sinusitis likely greater than 10 days: Amoxicillin 500 mg 3 times daily for 7 days Tessalon Perles 1 tablet every 8 hours as needed for cough Humibid 600 mg twice daily Sertraline 1 tablet orally daily Humidifier use will be helpful.  COVID-19 test was done Patient is advised to self isolate until  COVID-19 test results are available. Strict  return precautions were given. Final Clinical Impressions(s) / UC Diagnoses   Final diagnoses:  Acute non-recurrent maxillary sinusitis   Discharge Instructions   None    ED Prescriptions    Medication Sig Dispense Auth. Provider   amoxicillin (AMOXIL) 500 MG capsule Take 1 capsule (500 mg total) by mouth 3 (three) times daily. 21 capsule Brixon Zhen, Myrene Galas, MD   benzonatate (TESSALON) 100 MG capsule Take 1 capsule (100 mg total) by mouth every 8 (eight) hours. 21 capsule Rin Gorton, Myrene Galas, MD   cetirizine (ZYRTEC) 10 MG chewable tablet Chew 1 tablet (10 mg total) by mouth daily. 30 tablet Darrin Apodaca, Myrene Galas, MD   guaiFENesin (MUCINEX) 600 MG 12 hr tablet Take 1 tablet (600 mg total) by mouth 2 (two) times daily for 10 days. 20 tablet Naela Nodal, Myrene Galas, MD     PDMP not reviewed this encounter.   Chase Picket, MD 05/24/19 1124

## 2019-05-24 NOTE — ED Triage Notes (Signed)
Patient c/o cough, chest congestion, runny nose, and sinus pressure for 5-7 days.  Patient denies fevers.

## 2019-05-25 LAB — NOVEL CORONAVIRUS, NAA (HOSP ORDER, SEND-OUT TO REF LAB; TAT 18-24 HRS): SARS-CoV-2, NAA: NOT DETECTED

## 2019-05-27 ENCOUNTER — Telehealth: Payer: Self-pay

## 2019-05-27 NOTE — Telephone Encounter (Signed)
CONFIRMED AND SCREENED FOR 05-29-19 OV. 

## 2019-05-28 ENCOUNTER — Other Ambulatory Visit: Payer: Self-pay

## 2019-05-28 MED ORDER — LOVASTATIN 20 MG PO TABS: ORAL_TABLET | ORAL | 1 refills | Status: DC

## 2019-05-29 ENCOUNTER — Encounter: Payer: Self-pay | Admitting: Nurse Practitioner

## 2019-05-29 ENCOUNTER — Other Ambulatory Visit: Payer: Self-pay

## 2019-05-29 ENCOUNTER — Ambulatory Visit (INDEPENDENT_AMBULATORY_CARE_PROVIDER_SITE_OTHER): Payer: Medicare HMO | Admitting: Nurse Practitioner

## 2019-05-29 VITALS — BP 143/92 | HR 88 | Temp 97.4°F | Resp 16 | Ht 60.0 in | Wt 152.8 lb

## 2019-05-29 DIAGNOSIS — J209 Acute bronchitis, unspecified: Secondary | ICD-10-CM | POA: Diagnosis not present

## 2019-05-29 DIAGNOSIS — R059 Cough, unspecified: Secondary | ICD-10-CM

## 2019-05-29 DIAGNOSIS — R05 Cough: Secondary | ICD-10-CM

## 2019-05-29 DIAGNOSIS — E1165 Type 2 diabetes mellitus with hyperglycemia: Secondary | ICD-10-CM | POA: Diagnosis not present

## 2019-05-29 DIAGNOSIS — I10 Essential (primary) hypertension: Secondary | ICD-10-CM | POA: Diagnosis not present

## 2019-05-29 DIAGNOSIS — Z794 Long term (current) use of insulin: Secondary | ICD-10-CM

## 2019-05-29 LAB — POCT GLYCOSYLATED HEMOGLOBIN (HGB A1C): Hemoglobin A1C: 6.8 % — AB (ref 4.0–5.6)

## 2019-05-29 MED ORDER — NOVOLOG MIX 70/30 FLEXPEN (70-30) 100 UNIT/ML ~~LOC~~ SUPN: 21.0000 [IU] | PEN_INJECTOR | Freq: Two times a day (BID) | SUBCUTANEOUS | 11 refills | Status: DC

## 2019-05-29 MED ORDER — CLARITHROMYCIN 500 MG PO TABS: 500.0000 mg | ORAL_TABLET | Freq: Two times a day (BID) | ORAL | 0 refills | Status: DC

## 2019-05-29 MED ORDER — HYDROCOD POLST-CPM POLST ER 10-8 MG/5ML PO SUER: 5.0000 mL | Freq: Two times a day (BID) | ORAL | 0 refills | Status: DC | PRN

## 2019-05-29 NOTE — Progress Notes (Signed)
[facility] [place], [place] [address]  Internal MEDICINE  Office Visit Note  Patient Name: Tiffany Hawkins  [id number]  [id number]  Date of Service: 06/01/2019  Chief Complaint  Patient presents with  . Diabetes    blood sugars are elevated makes pt off balance, has had 2 falls   . Hypertension  . Hyperlipidemia  . Arthritis  . Quality Metric Gaps    lipid panel   . Cough    covid negative chest congestion, went to urgent care and as told it was sinuses/allergies, would like to talk about allergy medication, tessalon perls do not help with cough, would like cough syrup    The patient is here for routine follow up. She is complaining of cough and sinus congestion. Symptoms have been present for nearly two weeks. She was seein at Urgent care 05/24/2019. She was put on amoxicillin twice daily and given prescription for tessalon perls as needed. She also started on amoxicillin which she continues to take. She was negative for COVID 19 when seen at urgent Care. She does not feel any better.  She states that her blood sugars have been elevated for the past two weeks, worse since she has been sick. Her HgbA1c is 6.8 today, up from 5.5 at the most recent check.       Current Medication: Outpatient Encounter Medications as of 05/29/2019  Medication Sig  . amLODipine (NORVASC) 5 MG tablet Take 2 tablets (10 mg total) by mouth daily.  Marland Kitchen amoxicillin (AMOXIL) 500 MG capsule Take 1 capsule (500 mg total) by mouth 3 (three) times daily.  . benzonatate (TESSALON) 100 MG capsule Take 1 capsule (100 mg total) by mouth every 8 (eight) hours.  . Blood Glucose Calibration (ACCU-CHEK SMARTVIEW CONTROL VI) by In Vitro route. Use as di  . cetirizine (ZYRTEC) 10 MG chewable tablet Chew 1 tablet (10 mg total) by mouth daily.  . citalopram (CELEXA) 20 MG tablet Take 20 mg by mouth daily. Take 2 tablets daily for depression  . fenofibrate 54 MG tablet Take 1 tablet (54 mg total) by  mouth daily.  . fluticasone (FLONASE) 50 MCG/ACT nasal spray Place 2 sprays into both nostrils daily.  Marland Kitchen glucose blood (ACCU-CHEK SMARTVIEW) test strip Blood sugar checks TID DX E11.65  . guaiFENesin (MUCINEX) 600 MG 12 hr tablet Take 1 tablet (600 mg total) by mouth 2 (two) times daily for 10 days.  Marland Kitchen ibuprofen (ADVIL) 800 MG tablet Take 1 tablet (800 mg total) by mouth every 8 (eight) hours as needed.  . insulin aspart protamine - aspart (NOVOLOG MIX 70/30 FLEXPEN) (70-30) 100 UNIT/ML FlexPen Inject 0.21 mLs (21 Units total) into the skin 2 (two) times daily.  Marland Kitchen lisinopril (ZESTRIL) 5 MG tablet Take 1 tablet (5 mg total) by mouth daily.  Marland Kitchen lovastatin (MEVACOR) 20 MG tablet TAKE 1 TABLET AT BEDTIME FOR HIGH CHOLESTEROL  . metaxalone (SKELAXIN) 800 MG tablet Take 1 tablet (800 mg total) by mouth 2 (two) times daily as needed for muscle spasms.  . Multiple Vitamin (MULTIVITAMIN) tablet Take by mouth daily. TAKES 1/2 TABLET  . naproxen (NAPROSYN) 500 MG tablet Take 1 tablet (500 mg total) by mouth 2 (two) times daily as needed for moderate pain.  Marland Kitchen ondansetron (ZOFRAN ODT) 8 MG disintegrating tablet Take 1 tablet (8 mg total) by mouth 2 (two) times daily.  . ondansetron (ZOFRAN) 4 MG tablet Take 1 tablet (4 mg total) by mouth every 8 (eight) hours as  needed for nausea or vomiting.  . pantoprazole (PROTONIX) 20 MG tablet Take 2 tablets (40 mg total) by mouth daily.  . sharps container 1 each by Does not apply route as needed. To use for insulin syringes and needles. Taking insulin at least twice daily.  E11.65  . [DISCONTINUED] insulin aspart protamine - aspart (NOVOLOG MIX 70/30 FLEXPEN) (70-30) 100 UNIT/ML FlexPen Inject 0.18 mLs (18 Units total) into the skin 2 (two) times daily.  . chlorpheniramine-HYDROcodone (TUSSIONEX PENNKINETIC ER) 10-8 MG/5ML SUER Take 5 mLs by mouth every 12 (twelve) hours as needed for cough. Patient given GoodRx card to help with COPAY cost  . clarithromycin (BIAXIN) 500  MG tablet Take 1 tablet (500 mg total) by mouth 2 (two) times daily.   No facility-administered encounter medications on file as of 05/29/2019.    Surgical History: Past Surgical History:  Procedure Laterality Date  . APPENDECTOMY    . BACK SURGERY    . CATARACT EXTRACTION W/PHACO Left 12/03/2017   Procedure: CATARACT EXTRACTION PHACO AND INTRAOCULAR LENS PLACEMENT ([place]) LEFT  DIABETIC;  Surgeon: Eulogio Bear, MD;  Location: [facility];  Service: Ophthalmology;  Laterality: Left;  Diabetic - insulin  . CATARACT EXTRACTION W/PHACO Right 01/14/2018   Procedure: CATARACT EXTRACTION PHACO AND INTRAOCULAR LENS PLACEMENT ([place]) RIGHT DIABETIC;  Surgeon: Eulogio Bear, MD;  Location: [place];  Service: Ophthalmology;  Laterality: Right;  Diabetic - insulin  . CERVICAL DISC SURGERY    . CHOLECYSTECTOMY    . COLON SURGERY    . COLONOSCOPY    . COLONOSCOPY WITH PROPOFOL N/A 05/17/2015   Procedure: COLONOSCOPY WITH PROPOFOL;  Surgeon: Manya Silvas, MD;  Location: [facility] ENDOSCOPY;  Service: Endoscopy;  Laterality: N/A;  . COLONOSCOPY WITH PROPOFOL N/A 04/01/2018   Procedure: COLONOSCOPY WITH BIOPSY;  Surgeon: [name], MD;  Location: [place];  Service: Endoscopy;  Laterality: N/A;  Diabetic - insulin  . ESOPHAGOGASTRODUODENOSCOPY    . HERNIA REPAIR    . POLYPECTOMY N/A 04/01/2018   Procedure: POLYPECTOMY;  Surgeon: [name], MD;  Location: [facility];  Service: Endoscopy;  Laterality: N/A;  . PORT A CATH INJECTION ([place] [facility])     Port has been removed  . TUBAL LIGATION      Medical History: Past Medical History:  Diagnosis Date  . Anxiety   . Colon cancer ([place]) 10/12/2014   had chemo  . Diabetes mellitus without complication ([place])   . Hyperlipidemia   . Hypertension   . Lateral epicondylitis   . Personal history of chemotherapy   . Polio    childhood  . Sleep apnea    no CPAP, sx improved with wt loss    Family  History: Family History  Problem Relation Age of Onset  . Alzheimer's disease Mother   . Heart disease Father   . Colon cancer Sister   . Colon cancer Cousin   . Colon cancer Cousin   . Breast cancer Neg Hx     Social History   Socioeconomic History  . Marital status: Married    Spouse name: Not on file  . Number of children: Not on file  . Years of education: Not on file  . Highest education level: Not on file  Occupational History  . Not on file  Tobacco Use  . Smoking status: Former Smoker    Packs/day: 1.00    Types: Cigarettes    Quit date: 04/17/1981    Years since quitting: 38.1  .  Smokeless tobacco: Never Used  Substance and Sexual Activity  . Alcohol use: No  . Drug use: No  . Sexual activity: Never  Other Topics Concern  . Not on file  Social History Narrative  . Not on file   Social Determinants of Health   Financial Resource Strain:   . Difficulty of Paying Living Expenses: Not on file  Food Insecurity:   . Worried About Charity fundraiser in the Last Year: Not on file  . Ran Out of Food in the Last Year: Not on file  Transportation Needs:   . Lack of Transportation (Medical): Not on file  . Lack of Transportation (Non-Medical): Not on file  Physical Activity:   . Days of Exercise per Week: Not on file  . Minutes of Exercise per Session: Not on file  Stress:   . Feeling of Stress : Not on file  Social Connections:   . Frequency of Communication with Friends and Family: Not on file  . Frequency of Social Gatherings with Friends and Family: Not on file  . Attends Religious Services: Not on file  . Active Member of Clubs or Organizations: Not on file  . Attends Archivist Meetings: Not on file  . Marital Status: Not on file  Intimate Partner Violence:   . Fear of Current or Ex-Partner: Not on file  . Emotionally Abused: Not on file  . Physically Abused: Not on file  . Sexually Abused: Not on file      Review of Systems   Constitutional: Positive for activity change and fatigue. Negative for chills, fever and unexpected weight change.  HENT: Positive for congestion, postnasal drip, rhinorrhea, sinus pressure and sinus pain. Negative for sneezing and sore throat.   Respiratory: Positive for cough and wheezing. Negative for chest tightness and shortness of breath.   Cardiovascular: Negative for chest pain and palpitations.  Gastrointestinal: Negative for abdominal pain, constipation, diarrhea, nausea and vomiting.  Endocrine: Negative for cold intolerance, heat intolerance, polydipsia and polyuria.       Blood sugars have been more elevated since she has been sick the past two weeks.   Musculoskeletal: Positive for myalgias. Negative for arthralgias, back pain, joint swelling and neck pain.  Skin: Negative for rash.  Allergic/Immunologic: Positive for environmental allergies.  Neurological: Positive for headaches. Negative for tremors and numbness.  Hematological: Positive for adenopathy. Does not bruise/bleed easily.  Psychiatric/Behavioral: Negative for behavioral problems (Depression), sleep disturbance and suicidal ideas. The patient is nervous/anxious.     Today's Vitals   05/29/19 1147  BP: (!) 143/92  Pulse: 88  Resp: 16  Temp: (!) 97.4 F (36.3 C)  SpO2: 97%  Weight: 152 lb 12.8 oz (69.3 kg)  Height: 5' (1.524 m)   Body mass index is 29.84 kg/m.  Physical Exam Vitals and nursing note reviewed.  Constitutional:      General: She is not in acute distress.    Appearance: Normal appearance. She is well-developed. She is ill-appearing. She is not diaphoretic.  HENT:     Head: Normocephalic and atraumatic.     Nose: Congestion present.     Right Turbinates: Swollen.     Right Sinus: Maxillary sinus tenderness and frontal sinus tenderness present.     Left Sinus: Maxillary sinus tenderness and frontal sinus tenderness present.     Mouth/Throat:     Pharynx: No oropharyngeal exudate.  Eyes:      Pupils: Pupils are equal, round, and reactive to light.  Neck:     Thyroid: No thyromegaly.     Vascular: No JVD.     Trachea: No tracheal deviation.  Cardiovascular:     Rate and Rhythm: Normal rate and regular rhythm.     Heart sounds: Normal heart sounds. No murmur. No friction rub. No gallop.   Pulmonary:     Effort: Pulmonary effort is normal. No respiratory distress.     Breath sounds: Normal breath sounds. No wheezing or rales.     Comments: Deep, congested, non-productive cough present.  Chest:     Chest wall: No tenderness.  Abdominal:     General: Bowel sounds are normal.     Palpations: Abdomen is soft.  Musculoskeletal:        General: Normal range of motion.     Cervical back: Normal range of motion and neck supple.  Lymphadenopathy:     Cervical: Cervical adenopathy present.  Skin:    General: Skin is warm and dry.  Neurological:     Mental Status: She is alert and oriented to person, place, and time.     Cranial Nerves: No cranial nerve deficit.  Psychiatric:        Mood and Affect: Mood normal.        Behavior: Behavior normal.        Thought Content: Thought content normal.        Judgment: Judgment normal.    Assessment/Plan: 1. Acute bronchitis, unspecified organism Start biaxin 500mg  bid for 10 days. Rest and increase fluids. Use OTC medication as indicated to improve acute symptoms.  - clarithromycin (BIAXIN) 500 MG tablet; Take 1 tablet (500 mg total) by mouth 2 (two) times daily.  Dispense: 20 tablet; Refill: 0  2. Cough May take tussionex twice daily as needed for cough. Advised patient not to overuse this medicine and not to mix with other medications or alcohol as it can cause respiratory distress, sleepiness or dizziness. Should also avoid driving. Patient voiced understanding and agreement.  - chlorpheniramine-HYDROcodone (TUSSIONEX PENNKINETIC ER) 10-8 MG/5ML SUER; Take 5 mLs by mouth every 12 (twelve) hours as needed for cough. Patient given  GoodRx card to help with COPAY cost  Dispense: 115 mL; Refill: 0  3. Uncontrolled type 2 diabetes mellitus with hyperglycemia (HCC) HgbA1c 6.8 today, up from 5.5 at her last check. Will increase novolog mix 70/30 to 21units twice daily. Monitor closely. Adjust insulin dosing as indicated . - insulin aspart protamine - aspart (NOVOLOG MIX 70/30 FLEXPEN) (70-30) 100 UNIT/ML FlexPen; Inject 0.21 mLs (21 Units total) into the skin 2 (two) times daily.  Dispense: 15 mL; Refill: 11  4. Essential hypertension Stable. Continue BP medication as prescribed    General Counseling: adalida diego understanding of the findings of todays visit and agrees with plan of treatment. I have discussed any further diagnostic evaluation that may be needed or ordered today. We also reviewed her medications today. she has been encouraged to call the office with any questions or concerns that should arise related to todays visit.  Rest and increase fluids. Continue using OTC medication to control symptoms.   Diabetes Counseling:  1. Addition of ACE inh/ ARB'S for nephroprotection. Microalbumin is updated  2. Diabetic foot care, prevention of complications. Podiatry consult 3. Exercise and lose weight.  4. Diabetic eye examination, Diabetic eye exam is updated  5. Monitor blood sugar closlely. nutrition counseling.  6. Sign and symptoms of hypoglycemia including shaking sweating,confusion and headaches.  This patient was seen by Nira Conn  [place] with Dr Lavera Guise as a part of collaborative care agreement  Orders Placed This Encounter  Procedures  . POCT HgB A1C    Meds ordered this encounter  Medications  . insulin aspart protamine - aspart (NOVOLOG MIX 70/30 FLEXPEN) (70-30) 100 UNIT/ML FlexPen    Sig: Inject 0.21 mLs (21 Units total) into the skin 2 (two) times daily.    Dispense:  15 mL    Refill:  11    Increase dose    Order Specific Question:   Supervising Provider    Answer:    Lavera Guise [id number]  . clarithromycin (BIAXIN) 500 MG tablet    Sig: Take 1 tablet (500 mg total) by mouth 2 (two) times daily.    Dispense:  20 tablet    Refill:  0    Order Specific Question:   Supervising Provider    Answer:   Lavera Guise [id number]  . chlorpheniramine-HYDROcodone (TUSSIONEX PENNKINETIC ER) 10-8 MG/5ML SUER    Sig: Take 5 mLs by mouth every 12 (twelve) hours as needed for cough. Patient given GoodRx card to help with COPAY cost    Dispense:  115 mL    Refill:  0    Order Specific Question:   Supervising Provider    Answer:   Lavera Guise [id number]    Total time spent: 30 Minutes   Time spent includes review of chart, medications, test results, and follow up plan with the patient.      Dr Lavera Guise Internal medicine

## 2019-06-01 DIAGNOSIS — J209 Acute bronchitis, unspecified: Secondary | ICD-10-CM | POA: Insufficient documentation

## 2019-06-01 DIAGNOSIS — R05 Cough: Secondary | ICD-10-CM | POA: Insufficient documentation

## 2019-06-01 DIAGNOSIS — R058 Other specified cough: Secondary | ICD-10-CM | POA: Insufficient documentation

## 2019-06-11 DIAGNOSIS — L570 Actinic keratosis: Secondary | ICD-10-CM | POA: Diagnosis not present

## 2019-06-11 DIAGNOSIS — L578 Other skin changes due to chronic exposure to nonionizing radiation: Secondary | ICD-10-CM | POA: Diagnosis not present

## 2019-06-11 DIAGNOSIS — Z872 Personal history of diseases of the skin and subcutaneous tissue: Secondary | ICD-10-CM | POA: Diagnosis not present

## 2019-06-11 DIAGNOSIS — L820 Inflamed seborrheic keratosis: Secondary | ICD-10-CM | POA: Diagnosis not present

## 2019-06-11 DIAGNOSIS — Z859 Personal history of malignant neoplasm, unspecified: Secondary | ICD-10-CM | POA: Diagnosis not present

## 2019-06-11 DIAGNOSIS — L7211 Pilar cyst: Secondary | ICD-10-CM | POA: Diagnosis not present

## 2019-06-13 ENCOUNTER — Ambulatory Visit: Payer: Medicare HMO | Attending: Internal Medicine

## 2019-06-13 DIAGNOSIS — Z23 Encounter for immunization: Secondary | ICD-10-CM | POA: Insufficient documentation

## 2019-06-13 NOTE — Progress Notes (Signed)
   Covid-19 Vaccination Clinic  Name:  Tiffany Hawkins    MRN: [id number] DOB: 01-Feb-1946  06/13/2019  [name] was observed post Covid-19 immunization for 15 minutes without incidence. She was provided with Vaccine Information Sheet and instruction to access the V-Safe system.   [name] was instructed to call 911 with any severe reactions post vaccine: Marland Kitchen Difficulty breathing  . Swelling of your face and throat  . A fast heartbeat  . A bad rash all over your body  . Dizziness and weakness    Immunizations Administered    Name Date Dose VIS Date Route   Pfizer COVID-19 Vaccine 06/13/2019  8:39 AM 0.3 mL 03/28/2019 Intramuscular   Manufacturer: [place]   Lot: [id number]   [place]: [id number]

## 2019-06-27 ENCOUNTER — Other Ambulatory Visit: Payer: Self-pay | Admitting: Nurse Practitioner

## 2019-06-27 ENCOUNTER — Telehealth: Payer: Self-pay

## 2019-06-27 DIAGNOSIS — E1165 Type 2 diabetes mellitus with hyperglycemia: Secondary | ICD-10-CM

## 2019-06-27 MED ORDER — NOVOLOG MIX 70/30 FLEXPEN (70-30) 100 UNIT/ML ~~LOC~~ SUPN: 25.0000 [IU] | PEN_INJECTOR | Freq: Two times a day (BID) | SUBCUTANEOUS | 11 refills | Status: DC

## 2019-06-27 NOTE — Telephone Encounter (Signed)
Please let the patient know that Increased dose on novolog mix to 25units twice daily. She should increase dose to 23 units twice daily for three days, then increase to 25 units twice daily. Thanks

## 2019-06-27 NOTE — Telephone Encounter (Signed)
Pt was notified.  

## 2019-06-27 NOTE — Progress Notes (Signed)
Increased dose on novolog mix to 25units twice daily. She should increase dose to 23 units twice daily for three days, then increase to 25 units twice daily.

## 2019-07-02 ENCOUNTER — Other Ambulatory Visit: Payer: Self-pay

## 2019-07-02 MED ORDER — CITALOPRAM HYDROBROMIDE 20 MG PO TABS: ORAL_TABLET | ORAL | 1 refills | Status: DC

## 2019-07-08 ENCOUNTER — Ambulatory Visit: Payer: Medicare HMO | Attending: Internal Medicine

## 2019-07-08 DIAGNOSIS — Z23 Encounter for immunization: Secondary | ICD-10-CM

## 2019-07-15 ENCOUNTER — Other Ambulatory Visit: Payer: Self-pay

## 2019-07-15 DIAGNOSIS — K219 Gastro-esophageal reflux disease without esophagitis: Secondary | ICD-10-CM

## 2019-07-15 MED ORDER — PANTOPRAZOLE SODIUM 20 MG PO TBEC: 40.0000 mg | DELAYED_RELEASE_TABLET | Freq: Every day | ORAL | 1 refills | Status: DC

## 2019-08-12 ENCOUNTER — Other Ambulatory Visit: Payer: Self-pay

## 2019-08-12 DIAGNOSIS — E782 Mixed hyperlipidemia: Secondary | ICD-10-CM

## 2019-08-12 MED ORDER — FENOFIBRATE 54 MG PO TABS: 54.0000 mg | ORAL_TABLET | Freq: Every day | ORAL | 1 refills | Status: DC

## 2019-08-20 ENCOUNTER — Other Ambulatory Visit: Payer: Self-pay | Admitting: Internal Medicine

## 2019-08-20 DIAGNOSIS — I10 Essential (primary) hypertension: Secondary | ICD-10-CM

## 2019-08-26 ENCOUNTER — Telehealth: Payer: Self-pay

## 2019-08-26 NOTE — Telephone Encounter (Signed)
Confirmed and screened for 08-28-19 ov.

## 2019-08-28 ENCOUNTER — Encounter: Payer: Self-pay | Admitting: Nurse Practitioner

## 2019-08-28 ENCOUNTER — Other Ambulatory Visit: Payer: Self-pay

## 2019-08-28 ENCOUNTER — Ambulatory Visit
Admission: RE | Admit: 2019-08-28 | Discharge: 2019-08-28 | Disposition: A | Discharge status: Home / Self Care | Payer: Medicare HMO | Source: Ambulatory Visit | Attending: Nurse Practitioner | Admitting: Nurse Practitioner

## 2019-08-28 ENCOUNTER — Other Ambulatory Visit
Admission: RE | Admit: 2019-08-28 | Discharge: 2019-08-28 | Disposition: A | Discharge status: Home / Self Care | Payer: Medicare HMO | Source: Home / Self Care | Attending: Nurse Practitioner | Admitting: Nurse Practitioner

## 2019-08-28 ENCOUNTER — Ambulatory Visit (INDEPENDENT_AMBULATORY_CARE_PROVIDER_SITE_OTHER): Payer: Medicare HMO | Admitting: Nurse Practitioner

## 2019-08-28 VITALS — BP 139/81 | HR 97 | Temp 97.7°F | Resp 16 | Ht 60.0 in | Wt 155.0 lb

## 2019-08-28 DIAGNOSIS — R55 Syncope and collapse: Secondary | ICD-10-CM | POA: Diagnosis not present

## 2019-08-28 DIAGNOSIS — R058 Other specified cough: Secondary | ICD-10-CM

## 2019-08-28 DIAGNOSIS — Z794 Long term (current) use of insulin: Secondary | ICD-10-CM | POA: Diagnosis not present

## 2019-08-28 DIAGNOSIS — R197 Diarrhea, unspecified: Secondary | ICD-10-CM

## 2019-08-28 DIAGNOSIS — R5383 Other fatigue: Secondary | ICD-10-CM | POA: Insufficient documentation

## 2019-08-28 DIAGNOSIS — E1165 Type 2 diabetes mellitus with hyperglycemia: Secondary | ICD-10-CM

## 2019-08-28 DIAGNOSIS — R531 Weakness: Secondary | ICD-10-CM | POA: Diagnosis not present

## 2019-08-28 DIAGNOSIS — E119 Type 2 diabetes mellitus without complications: Secondary | ICD-10-CM | POA: Insufficient documentation

## 2019-08-28 DIAGNOSIS — J069 Acute upper respiratory infection, unspecified: Secondary | ICD-10-CM

## 2019-08-28 DIAGNOSIS — R05 Cough: Secondary | ICD-10-CM

## 2019-08-28 DIAGNOSIS — R269 Unspecified abnormalities of gait and mobility: Secondary | ICD-10-CM | POA: Diagnosis not present

## 2019-08-28 DIAGNOSIS — R5382 Chronic fatigue, unspecified: Secondary | ICD-10-CM | POA: Insufficient documentation

## 2019-08-28 DIAGNOSIS — R42 Dizziness and giddiness: Secondary | ICD-10-CM

## 2019-08-28 LAB — FERRITIN: Ferritin: 294 ng/mL (ref 11–307)

## 2019-08-28 LAB — IRON AND TIBC
Iron: 48 ug/dL (ref 28–170)
Saturation Ratios: 15 % (ref 10.4–31.8)
TIBC: 326 ug/dL (ref 250–450)
UIBC: 278 ug/dL

## 2019-08-28 LAB — BASIC METABOLIC PANEL
Anion gap: 8 (ref 5–15)
BUN: 22 mg/dL (ref 8–23)
CO2: 27 mmol/L (ref 22–32)
Calcium: 9.3 mg/dL (ref 8.9–10.3)
Chloride: 105 mmol/L (ref 98–111)
Creatinine, Ser: 1.04 mg/dL — ABNORMAL HIGH (ref 0.44–1.00)
GFR calc Af Amer: >60 mL/min (ref >60)
GFR calc non Af Amer: 53 mL/min — ABNORMAL LOW (ref >60)
Glucose, Bld: 116 mg/dL — ABNORMAL HIGH (ref 70–99)
Potassium: 4.3 mmol/L (ref 3.5–5.1)
Sodium: 140 mmol/L (ref 135–145)

## 2019-08-28 LAB — CBC
HCT: 41.7 % (ref 36.0–46.0)
Hemoglobin: 14.1 g/dL (ref 12.0–15.0)
MCH: 29.4 pg (ref 26.0–34.0)
MCHC: 33.8 g/dL (ref 30.0–36.0)
MCV: 86.9 fL (ref 80.0–100.0)
Platelets: 227 10*3/uL (ref 150–400)
RBC: 4.8 MIL/uL (ref 3.87–5.11)
RDW: 12.9 % (ref 11.5–15.5)
WBC: 10.8 10*3/uL — ABNORMAL HIGH (ref 4.0–10.5)
nRBC: 0 % (ref 0.0–0.2)

## 2019-08-28 LAB — POCT GLYCOSYLATED HEMOGLOBIN (HGB A1C): Hemoglobin A1C: 7 % — AB (ref 4.0–5.6)

## 2019-08-28 LAB — TSH: TSH: 2.867 u[IU]/mL (ref 0.350–4.500)

## 2019-08-28 LAB — FOLATE: Folate: 23 ng/mL (ref >5.9)

## 2019-08-28 LAB — VITAMIN B12: Vitamin B-12: 248 pg/mL (ref 180–914)

## 2019-08-28 LAB — T4, FREE: Free T4: 0.76 ng/dL (ref 0.61–1.12)

## 2019-08-28 MED ORDER — CEFUROXIME AXETIL 500 MG PO TABS: 500.0000 mg | ORAL_TABLET | Freq: Two times a day (BID) | ORAL | 0 refills | Status: DC

## 2019-08-28 NOTE — Progress Notes (Signed)
[facility] [place], [place] [address]  Internal MEDICINE  Office Visit Note  Patient Name: Tiffany Hawkins  [id number]  [id number]  Date of Service: 08/28/2019  Chief Complaint  Patient presents with  . Diabetes    blood sugars are running   . Hypertension  . Hyperlipidemia  . Arthritis  . Anxiety  . Cough    will not go away, spit up yellow phlegm   . Medication Management    insulin  . Diarrhea    frequently   . Fatigue    cannot stand up for long periods of time, legs are weak     The patient is here for follow up visit. She presents to the office with her daughter. They state that patient has been feeling poorly since she was last seen in February. At that time, patient was treated with Biaxin 500mg  BID for 10 days. She was negative for COVID 19 per urgent care visit. She states they did chest x-ra which was negative for any abnormalities.  She states that she never really felt better since then. She continues to have congestion and stuffiness in the nose and sinuses. She has a great deal of post nasal drip with chronic cough. She is sometimes coughing up yellow sputum. She states that she is exhausted all the time. Difficult for her to get up and get going. She states that she is getting severely fatigued even when just straightening her house. She is having more severe dizziness. States that she is off balance all the time. Using a walker at home sometimes, as vertigo is so severe. She has seen neurology for this in the past. This is chronic issue which has ben getting worse since she became ill in February. States that it is hard to stand for short periods of time. Her legs "feel like noodles."  She is also having several episodes of diarrhea each day. Unclear if this is from chronic drainage or if there is another problem going on. She does have history of colon cancer.  Blood sugars are continuing to rise. HgbA1c is 7.0 today, up from 6.8. she is taking  novolog 70/30 mix at 25 units everyday. This was increased back in February from 23 units, but blood sguars have not really improved.       Current Medication: Outpatient Encounter Medications as of 08/28/2019  Medication Sig  . amLODipine (NORVASC) 5 MG tablet TAKE 2 TABLETS EVERY DAY  . Blood Glucose Calibration (ACCU-CHEK SMARTVIEW CONTROL VI) by In Vitro route. Use as di  . cetirizine (ZYRTEC) 10 MG chewable tablet Chew 1 tablet (10 mg total) by mouth daily.  . citalopram (CELEXA) 20 MG tablet TAKE 2 TABLETS DAILY FOR DEPRESSION.  . fenofibrate 54 MG tablet Take 1 tablet (54 mg total) by mouth daily.  . fluticasone (FLONASE) 50 MCG/ACT nasal spray Place 2 sprays into both nostrils daily.  Marland Kitchen glucose blood (ACCU-CHEK SMARTVIEW) test strip Blood sugar checks TID DX E11.65  . ibuprofen (ADVIL) 800 MG tablet Take 1 tablet (800 mg total) by mouth every 8 (eight) hours as needed.  . insulin aspart protamine - aspart (NOVOLOG MIX 70/30 FLEXPEN) (70-30) 100 UNIT/ML FlexPen Inject 0.25 mLs (25 Units total) into the skin 2 (two) times daily.  Marland Kitchen lisinopril (ZESTRIL) 5 MG tablet Take 1 tablet (5 mg total) by mouth daily.  Marland Kitchen lovastatin (MEVACOR) 20 MG tablet TAKE 1 TABLET AT BEDTIME FOR HIGH CHOLESTEROL  . metaxalone (SKELAXIN) 800 MG  tablet Take 1 tablet (800 mg total) by mouth 2 (two) times daily as needed for muscle spasms.  . Multiple Vitamin (MULTIVITAMIN) tablet Take by mouth daily. TAKES 1/2 TABLET  . naproxen (NAPROSYN) 500 MG tablet Take 1 tablet (500 mg total) by mouth 2 (two) times daily as needed for moderate pain.  Marland Kitchen ondansetron (ZOFRAN ODT) 8 MG disintegrating tablet Take 1 tablet (8 mg total) by mouth 2 (two) times daily.  . ondansetron (ZOFRAN) 4 MG tablet Take 1 tablet (4 mg total) by mouth every 8 (eight) hours as needed for nausea or vomiting.  . pantoprazole (PROTONIX) 20 MG tablet Take 2 tablets (40 mg total) by mouth daily.  . sharps container 1 each by Does not apply route as  needed. To use for insulin syringes and needles. Taking insulin at least twice daily.  E11.65  . benzonatate (TESSALON) 100 MG capsule Take 1 capsule (100 mg total) by mouth every 8 (eight) hours. (Patient not taking: Reported on 08/28/2019)  . cefUROXime (CEFTIN) 500 MG tablet Take 1 tablet (500 mg total) by mouth 2 (two) times daily with a meal.  . chlorpheniramine-HYDROcodone (TUSSIONEX PENNKINETIC ER) 10-8 MG/5ML SUER Take 5 mLs by mouth every 12 (twelve) hours as needed for cough. Patient given GoodRx card to help with COPAY cost (Patient not taking: Reported on 08/28/2019)  . [DISCONTINUED] amoxicillin (AMOXIL) 500 MG capsule Take 1 capsule (500 mg total) by mouth 3 (three) times daily. (Patient not taking: Reported on 08/28/2019)  . [DISCONTINUED] clarithromycin (BIAXIN) 500 MG tablet Take 1 tablet (500 mg total) by mouth 2 (two) times daily. (Patient not taking: Reported on 08/28/2019)   No facility-administered encounter medications on file as of 08/28/2019.    Surgical History: Past Surgical History:  Procedure Laterality Date  . APPENDECTOMY    . BACK SURGERY    . CATARACT EXTRACTION W/PHACO Left 12/03/2017   Procedure: CATARACT EXTRACTION PHACO AND INTRAOCULAR LENS PLACEMENT ([place]) LEFT  DIABETIC;  Surgeon: Eulogio Bear, MD;  Location: [facility];  Service: Ophthalmology;  Laterality: Left;  Diabetic - insulin  . CATARACT EXTRACTION W/PHACO Right 01/14/2018   Procedure: CATARACT EXTRACTION PHACO AND INTRAOCULAR LENS PLACEMENT ([place]) RIGHT DIABETIC;  Surgeon: Eulogio Bear, MD;  Location: [facility] [facility];  Service: Ophthalmology;  Laterality: Right;  Diabetic - insulin  . CERVICAL DISC SURGERY    . CHOLECYSTECTOMY    . COLON SURGERY    . COLONOSCOPY    . COLONOSCOPY WITH PROPOFOL N/A 05/17/2015   Procedure: COLONOSCOPY WITH PROPOFOL;  Surgeon: Manya Silvas, MD;  Location[facility] ENDOSCOPY;  Service: Endoscopy;  Laterality: N/A;  . COLONOSCOPY WITH PROPOFOL N/A  04/01/2018   Procedure: COLONOSCOPY WITH BIOPSY;  Surgeon: [name], MD;  Location: [facility];  Service: Endoscopy;  Laterality: N/A;  Diabetic - insulin  . ESOPHAGOGASTRODUODENOSCOPY    . HERNIA REPAIR    . POLYPECTOMY N/A 04/01/2018   Procedure: POLYPECTOMY;  Surgeon: [name], MD;  Location: [facility];  Service: Endoscopy;  Laterality: N/A;  . PORT A CATH INJECTION ([place] [facility])     Port has been removed  . TUBAL LIGATION      Medical History: Past Medical History:  Diagnosis Date  . Anxiety   . Colon cancer ([place]) 10/12/2014   had chemo  . Diabetes mellitus without complication ([place])   . Hyperlipidemia   . Hypertension   . Lateral epicondylitis   . Personal history of chemotherapy   . Polio  childhood  . Sleep apnea    no CPAP, sx improved with wt loss    Family History: Family History  Problem Relation Age of Onset  . Alzheimer's disease Mother   . Heart disease Father   . Colon cancer Sister   . Colon cancer Cousin   . Colon cancer Cousin   . Breast cancer Neg Hx     Social History   Socioeconomic History  . Marital status: Married    Spouse name: Not on file  . Number of children: Not on file  . Years of education: Not on file  . Highest education level: Not on file  Occupational History  . Not on file  Tobacco Use  . Smoking status: Former Smoker    Packs/day: 1.00    Types: Cigarettes    Quit date: 04/17/1981    Years since quitting: 38.3  . Smokeless tobacco: Never Used  Substance and Sexual Activity  . Alcohol use: No  . Drug use: No  . Sexual activity: Never  Other Topics Concern  . Not on file  Social History Narrative  . Not on file   Social Determinants of Health   Financial Resource Strain:   . Difficulty of Paying Living Expenses:   Food Insecurity:   . Worried About Charity fundraiser in the Last Year:   . Arboriculturist in the Last Year:   Transportation Needs:   . Film/video editor (Medical):    Marland Kitchen Lack of Transportation (Non-Medical):   Physical Activity:   . Days of Exercise per Week:   . Minutes of Exercise per Session:   Stress:   . Feeling of Stress :   Social Connections:   . Frequency of Communication with Friends and Family:   . Frequency of Social Gatherings with Friends and Family:   . Attends Religious Services:   . Active Member of Clubs or Organizations:   . Attends Archivist Meetings:   Marland Kitchen Marital Status:   Intimate Partner Violence:   . Fear of Current or Ex-Partner:   . Emotionally Abused:   Marland Kitchen Physically Abused:   . Sexually Abused:       Review of Systems  Constitutional: Positive for activity change and fatigue. Negative for chills, fever and unexpected weight change.  HENT: Positive for congestion, postnasal drip, rhinorrhea, sinus pressure and sinus pain. Negative for sneezing and sore throat.   Respiratory: Positive for cough. Negative for chest tightness, shortness of breath and wheezing.   Cardiovascular: Negative for chest pain and palpitations.  Gastrointestinal: Positive for diarrhea. Negative for abdominal pain, constipation, nausea and vomiting.  Endocrine: Negative for cold intolerance, heat intolerance, polydipsia and polyuria.       Blood sugars continue to be elevated.   Musculoskeletal: Positive for arthralgias and myalgias. Negative for back pain, joint swelling and neck pain.  Skin: Negative for rash.  Allergic/Immunologic: Positive for environmental allergies.  Neurological: Positive for dizziness, weakness and headaches. Negative for tremors and numbness.  Hematological: Positive for adenopathy. Does not bruise/bleed easily.  Psychiatric/Behavioral: Negative for behavioral problems (Depression), sleep disturbance and suicidal ideas. The patient is nervous/anxious.     Today's Vitals   08/28/19 1144  BP: 139/81  Pulse: 97  Resp: 16  Temp: 97.7 F (36.5 C)  SpO2: 97%  Weight: 155 lb (70.3 kg)  Height: 5' (1.524 m)    Body mass index is 30.27 kg/m.  Physical Exam Vitals and nursing note reviewed.  Constitutional:  General: She is not in acute distress.    Appearance: Normal appearance. She is well-developed. She is ill-appearing. She is not diaphoretic.  HENT:     Head: Normocephalic and atraumatic.     Nose: Congestion present.     Right Turbinates: Swollen.     Right Sinus: Maxillary sinus tenderness and frontal sinus tenderness present.     Left Sinus: Maxillary sinus tenderness and frontal sinus tenderness present.     Mouth/Throat:     Pharynx: No oropharyngeal exudate.  Eyes:     Pupils: Pupils are equal, round, and reactive to light.  Neck:     Thyroid: No thyromegaly.     Vascular: No carotid bruit or JVD.     Trachea: No tracheal deviation.  Cardiovascular:     Rate and Rhythm: Normal rate and regular rhythm.     Heart sounds: Normal heart sounds. No murmur. No friction rub. No gallop.   Pulmonary:     Effort: Pulmonary effort is normal. No respiratory distress.     Breath sounds: Normal breath sounds. No wheezing or rales.     Comments: Congested, nonproductive cough noted.  Chest:     Chest wall: No tenderness.  Abdominal:     Palpations: Abdomen is soft.  Musculoskeletal:        General: Normal range of motion.     Cervical back: Normal range of motion and neck supple.  Lymphadenopathy:     Cervical: Cervical adenopathy present.  Skin:    General: Skin is warm and dry.  Neurological:     Mental Status: She is alert and oriented to person, place, and time.     Cranial Nerves: No cranial nerve deficit or facial asymmetry.     Sensory: Sensation is intact.     Motor: Weakness present.     Coordination: Coordination abnormal.     Gait: Gait abnormal.  Psychiatric:        Mood and Affect: Mood normal.        Behavior: Behavior normal.        Thought Content: Thought content normal.        Judgment: Judgment normal.   Assessment/Plan: 1. Acute upper respiratory  infection Start ceftin 500mg  twice daily for next 10 days. Add back flonase nasal spray daily while on antibiotics.  - cefUROXime (CEFTIN) 500 MG tablet; Take 1 tablet (500 mg total) by mouth 2 (two) times daily with a meal.  Dispense: 20 tablet; Refill: 0  2. Cough productive of yellow sputum Will get chest x-ray for further evaluation.  - DG Chest 2 View; Future  3. Abnormality of gait and mobility Worsening balance and abnormality of gait. Refer to home health/physical therapy for further evaluation and treatment . - Ambulatory referral to [facility]  4. Weakness Worsening balance and abnormality of gait due to weakness and vertigo. Refer to home health/physical therapy for further evaluation and treatment.  - Ambulatory referral to Home Health  5. Vertigo Worsening balance and abnormality of gait due to weakness and vertigo. Refer to home health/physical therapy for further evaluation and treatment.  - Ambulatory referral to [facility]  6. Type 2 diabetes mellitus with hyperglycemia, with long-term current use of insulin (HCC) - POCT HgB A1C 7.0 today. Advised the patient and her daughter to increase 70/30 mix insulin by two units twice daily every few days until 31 units is reached. If blood sugars are consistently under 130, can hold at that dose without increasing further. Monitor  blood sugars closely. writtent instructions were provided. Both patient and her daughter voiced understanding.   7. Diarrhea, unspecified type Unclear etiology. If no better after antibiotic treatment, will get stool samples and refer back to GI provider as she does have history of colon cancer.   General Counseling: kiylee mangar understanding of the findings of todays visit and agrees with plan of treatment. I have discussed any further diagnostic evaluation that may be needed or ordered today. We also reviewed her medications today. she has been encouraged to call the office with any questions or  concerns that should arise related to todays visit.  This patient was seen by Leretha Pol FNP Collaboration with Dr Lavera Guise as a part of collaborative care agreement  Orders Placed This Encounter  Procedures  . DG Chest 2 View  . Ambulatory referral to Home Health  . POCT HgB A1C    Meds ordered this encounter  Medications  . cefUROXime (CEFTIN) 500 MG tablet    Sig: Take 1 tablet (500 mg total) by mouth 2 (two) times daily with a meal.    Dispense:  20 tablet    Refill:  0    Order Specific Question:   Supervising Provider    Answer:   Lavera Guise [id number]    Total time spent: 5 Minutes  Time spent includes review of chart, medications, test results, and follow up plan with the patient.      Dr Lavera Guise Internal medicine

## 2019-08-28 NOTE — Progress Notes (Signed)
Started ceftin during visit.

## 2019-08-29 ENCOUNTER — Telehealth: Payer: Self-pay

## 2019-08-29 NOTE — Telephone Encounter (Signed)
-----   Message from Ronnell Freshwater, NP sent at 08/29/2019  8:38 AM EDT ----- Please let the patient know that her chest x-ray was normal. Thanks.

## 2019-08-29 NOTE — Progress Notes (Signed)
Please let the patient know that her chest x-ray was normal. Thanks.

## 2019-08-29 NOTE — Telephone Encounter (Signed)
Pt notified for labs  

## 2019-08-29 NOTE — Telephone Encounter (Signed)
-----   Message from Ronnell Freshwater, NP sent at 08/29/2019  8:40 AM EDT ----- Also, can you let her know that her labs look good overall. Does show infectious process going on. We started antibiotics yesterday. That should help. May benefit from B12 injections, but we can talk about that at her next vsiit. Blood sugar was only  116. Thanks.

## 2019-08-29 NOTE — Progress Notes (Signed)
Also, can you let her know that her labs look good overall. Does show infectious process going on. We started antibiotics yesterday. That should help. May benefit from B12 injections, but we can talk about that at her next vsiit. Blood sugar was only 116. Thanks.

## 2019-08-29 NOTE — Telephone Encounter (Signed)
Pt advised chest xray normal

## 2019-09-03 ENCOUNTER — Telehealth: Payer: Self-pay

## 2019-09-03 DIAGNOSIS — E782 Mixed hyperlipidemia: Secondary | ICD-10-CM | POA: Diagnosis not present

## 2019-09-03 DIAGNOSIS — E1122 Type 2 diabetes mellitus with diabetic chronic kidney disease: Secondary | ICD-10-CM | POA: Diagnosis not present

## 2019-09-03 DIAGNOSIS — F419 Anxiety disorder, unspecified: Secondary | ICD-10-CM | POA: Diagnosis not present

## 2019-09-03 DIAGNOSIS — N182 Chronic kidney disease, stage 2 (mild): Secondary | ICD-10-CM | POA: Diagnosis not present

## 2019-09-03 DIAGNOSIS — K760 Fatty (change of) liver, not elsewhere classified: Secondary | ICD-10-CM | POA: Diagnosis not present

## 2019-09-03 DIAGNOSIS — J209 Acute bronchitis, unspecified: Secondary | ICD-10-CM | POA: Diagnosis not present

## 2019-09-03 DIAGNOSIS — I129 Hypertensive chronic kidney disease with stage 1 through stage 4 chronic kidney disease, or unspecified chronic kidney disease: Secondary | ICD-10-CM | POA: Diagnosis not present

## 2019-09-03 DIAGNOSIS — M1990 Unspecified osteoarthritis, unspecified site: Secondary | ICD-10-CM | POA: Diagnosis not present

## 2019-09-03 DIAGNOSIS — R42 Dizziness and giddiness: Secondary | ICD-10-CM | POA: Diagnosis not present

## 2019-09-03 NOTE — Telephone Encounter (Signed)
[facility] PHYSICAL THERAPIST CALLED TO GET VERBAL ORDERS FOR PT AFTER SEEING HER TODAY TO START UP CARE.  2 TIMES WEEK FOR 3 WEEKS 1 TIME A WEEK FOR 3 WEEKS  ALSO WOULD LIKE FOR PATIENT TO BE EVALUATED FOR OCCUPATIONAL THERAPY.   GAVE HIM VERBAL OK

## 2019-09-08 DIAGNOSIS — M1990 Unspecified osteoarthritis, unspecified site: Secondary | ICD-10-CM | POA: Diagnosis not present

## 2019-09-08 DIAGNOSIS — J209 Acute bronchitis, unspecified: Secondary | ICD-10-CM | POA: Diagnosis not present

## 2019-09-08 DIAGNOSIS — R42 Dizziness and giddiness: Secondary | ICD-10-CM | POA: Diagnosis not present

## 2019-09-08 DIAGNOSIS — I129 Hypertensive chronic kidney disease with stage 1 through stage 4 chronic kidney disease, or unspecified chronic kidney disease: Secondary | ICD-10-CM | POA: Diagnosis not present

## 2019-09-08 DIAGNOSIS — K760 Fatty (change of) liver, not elsewhere classified: Secondary | ICD-10-CM | POA: Diagnosis not present

## 2019-09-08 DIAGNOSIS — E1122 Type 2 diabetes mellitus with diabetic chronic kidney disease: Secondary | ICD-10-CM | POA: Diagnosis not present

## 2019-09-08 DIAGNOSIS — F419 Anxiety disorder, unspecified: Secondary | ICD-10-CM | POA: Diagnosis not present

## 2019-09-08 DIAGNOSIS — N182 Chronic kidney disease, stage 2 (mild): Secondary | ICD-10-CM | POA: Diagnosis not present

## 2019-09-08 DIAGNOSIS — E782 Mixed hyperlipidemia: Secondary | ICD-10-CM | POA: Diagnosis not present

## 2019-09-09 ENCOUNTER — Telehealth: Payer: Self-pay

## 2019-09-09 DIAGNOSIS — K760 Fatty (change of) liver, not elsewhere classified: Secondary | ICD-10-CM | POA: Diagnosis not present

## 2019-09-09 DIAGNOSIS — N182 Chronic kidney disease, stage 2 (mild): Secondary | ICD-10-CM | POA: Diagnosis not present

## 2019-09-09 DIAGNOSIS — M1990 Unspecified osteoarthritis, unspecified site: Secondary | ICD-10-CM | POA: Diagnosis not present

## 2019-09-09 DIAGNOSIS — J209 Acute bronchitis, unspecified: Secondary | ICD-10-CM | POA: Diagnosis not present

## 2019-09-09 DIAGNOSIS — E1122 Type 2 diabetes mellitus with diabetic chronic kidney disease: Secondary | ICD-10-CM | POA: Diagnosis not present

## 2019-09-09 DIAGNOSIS — R42 Dizziness and giddiness: Secondary | ICD-10-CM | POA: Diagnosis not present

## 2019-09-09 DIAGNOSIS — F419 Anxiety disorder, unspecified: Secondary | ICD-10-CM | POA: Diagnosis not present

## 2019-09-09 DIAGNOSIS — E782 Mixed hyperlipidemia: Secondary | ICD-10-CM | POA: Diagnosis not present

## 2019-09-09 DIAGNOSIS — I129 Hypertensive chronic kidney disease with stage 1 through stage 4 chronic kidney disease, or unspecified chronic kidney disease: Secondary | ICD-10-CM | POA: Diagnosis not present

## 2019-09-09 NOTE — Telephone Encounter (Signed)
[facility] occupational therapist called requesting verbal orders for 2 times a week for 2 weeks, and once a week for 2 weeks. I ok'd verbal orders. Beth

## 2019-09-10 ENCOUNTER — Telehealth: Payer: Self-pay

## 2019-09-10 NOTE — Telephone Encounter (Signed)
Confirmed and screened for 09-12-19 ov.

## 2019-09-11 DIAGNOSIS — E782 Mixed hyperlipidemia: Secondary | ICD-10-CM | POA: Diagnosis not present

## 2019-09-11 DIAGNOSIS — J209 Acute bronchitis, unspecified: Secondary | ICD-10-CM | POA: Diagnosis not present

## 2019-09-11 DIAGNOSIS — I129 Hypertensive chronic kidney disease with stage 1 through stage 4 chronic kidney disease, or unspecified chronic kidney disease: Secondary | ICD-10-CM | POA: Diagnosis not present

## 2019-09-11 DIAGNOSIS — F419 Anxiety disorder, unspecified: Secondary | ICD-10-CM | POA: Diagnosis not present

## 2019-09-11 DIAGNOSIS — K760 Fatty (change of) liver, not elsewhere classified: Secondary | ICD-10-CM | POA: Diagnosis not present

## 2019-09-11 DIAGNOSIS — N182 Chronic kidney disease, stage 2 (mild): Secondary | ICD-10-CM | POA: Diagnosis not present

## 2019-09-11 DIAGNOSIS — M1990 Unspecified osteoarthritis, unspecified site: Secondary | ICD-10-CM | POA: Diagnosis not present

## 2019-09-11 DIAGNOSIS — E1122 Type 2 diabetes mellitus with diabetic chronic kidney disease: Secondary | ICD-10-CM | POA: Diagnosis not present

## 2019-09-11 DIAGNOSIS — R42 Dizziness and giddiness: Secondary | ICD-10-CM | POA: Diagnosis not present

## 2019-09-12 ENCOUNTER — Ambulatory Visit (INDEPENDENT_AMBULATORY_CARE_PROVIDER_SITE_OTHER): Payer: Medicare HMO | Admitting: Nurse Practitioner

## 2019-09-12 ENCOUNTER — Encounter: Payer: Self-pay | Admitting: Nurse Practitioner

## 2019-09-12 ENCOUNTER — Other Ambulatory Visit: Payer: Self-pay

## 2019-09-12 ENCOUNTER — Ambulatory Visit: Payer: Medicare HMO | Admitting: Nurse Practitioner

## 2019-09-12 VITALS — BP 144/79 | HR 99 | Temp 97.6°F | Resp 16 | Ht 60.0 in | Wt 153.6 lb

## 2019-09-12 DIAGNOSIS — R300 Dysuria: Secondary | ICD-10-CM | POA: Diagnosis not present

## 2019-09-12 DIAGNOSIS — J3089 Other allergic rhinitis: Secondary | ICD-10-CM | POA: Diagnosis not present

## 2019-09-12 DIAGNOSIS — Z85038 Personal history of other malignant neoplasm of large intestine: Secondary | ICD-10-CM

## 2019-09-12 DIAGNOSIS — J069 Acute upper respiratory infection, unspecified: Secondary | ICD-10-CM

## 2019-09-12 DIAGNOSIS — R197 Diarrhea, unspecified: Secondary | ICD-10-CM

## 2019-09-12 DIAGNOSIS — E1165 Type 2 diabetes mellitus with hyperglycemia: Secondary | ICD-10-CM

## 2019-09-12 LAB — POCT URINALYSIS DIPSTICK
Bilirubin, UA: NEGATIVE
Blood, UA: NEGATIVE
Glucose, UA: NEGATIVE
Leukocytes, UA: NEGATIVE
Nitrite, UA: NEGATIVE
Protein, UA: POSITIVE — AB
Spec Grav, UA: 1.015 (ref 1.010–1.025)
Urobilinogen, UA: 0.2 E.U./dL
pH, UA: 5 (ref 5.0–8.0)

## 2019-09-12 MED ORDER — LEVOFLOXACIN 500 MG PO TABS: 500.0000 mg | ORAL_TABLET | Freq: Every day | ORAL | 0 refills | Status: DC

## 2019-09-12 MED ORDER — NOVOLOG MIX 70/30 FLEXPEN (70-30) 100 UNIT/ML ~~LOC~~ SUPN: 35.0000 [IU] | PEN_INJECTOR | Freq: Two times a day (BID) | SUBCUTANEOUS | 11 refills | Status: DC

## 2019-09-12 MED ORDER — CETIRIZINE HCL 10 MG PO CHEW: 10.0000 mg | CHEWABLE_TABLET | Freq: Every day | ORAL | 1 refills | Status: AC

## 2019-09-12 NOTE — Progress Notes (Signed)
[facility] [place],  [address]  Internal MEDICINE  Office Visit Note  Patient Name: Tiffany Hawkins  [id number]  [id number]  Date of Service: 09/17/2019  Chief Complaint  Patient presents with  . Follow-up    still coughing , blood sugar still high, started physical therapy  . Diabetes  . Hyperlipidemia  . Hypertension    The patient is here for follow up. She was treated for upper respiratory infection, initially treated with biaxin twice daily. After she did not get better, treated her with ceftin twice daily for ten days. She has completed the antibiotics. She had chest x-ray which was negative. She continues to have nasal congestion and croupy sounding cough.  Blood sugars remain high. Has increased her novolog 70/30 up to 31 units twice daily. They are still running in the mid to high 100s, fasting.  She is having physical and occupational therapy at home. This is helping her to regain some of her strength and improve gait.  History of colon cancer. Had been seeing Dr. Grayland Ormond. Last visit 03/05/2018. Was to have six month evaluation after that.        Current Medication: Outpatient Encounter Medications as of 09/12/2019  Medication Sig  . amLODipine (NORVASC) 5 MG tablet TAKE 2 TABLETS EVERY DAY  . Blood Glucose Calibration (ACCU-CHEK SMARTVIEW CONTROL VI) by In Vitro route. Use as di  . cetirizine (ZYRTEC) 10 MG chewable tablet Chew 1 tablet (10 mg total) by mouth daily.  . chlorpheniramine-HYDROcodone (TUSSIONEX PENNKINETIC ER) 10-8 MG/5ML SUER Take 5 mLs by mouth every 12 (twelve) hours as needed for cough. Patient given GoodRx card to help with COPAY cost  . citalopram (CELEXA) 20 MG tablet TAKE 2 TABLETS DAILY FOR DEPRESSION.  . fenofibrate 54 MG tablet Take 1 tablet (54 mg total) by mouth daily.  . fluticasone (FLONASE) 50 MCG/ACT nasal spray Place 2 sprays into both nostrils daily.  Marland Kitchen glucose blood (ACCU-CHEK SMARTVIEW) test strip  Blood sugar checks TID DX E11.65  . ibuprofen (ADVIL) 800 MG tablet Take 1 tablet (800 mg total) by mouth every 8 (eight) hours as needed.  . insulin aspart protamine - aspart (NOVOLOG MIX 70/30 FLEXPEN) (70-30) 100 UNIT/ML FlexPen Inject 0.35 mLs (35 Units total) into the skin 2 (two) times daily.  Marland Kitchen lisinopril (ZESTRIL) 5 MG tablet Take 1 tablet (5 mg total) by mouth daily.  Marland Kitchen lovastatin (MEVACOR) 20 MG tablet TAKE 1 TABLET AT BEDTIME FOR HIGH CHOLESTEROL  . metaxalone (SKELAXIN) 800 MG tablet Take 1 tablet (800 mg total) by mouth 2 (two) times daily as needed for muscle spasms.  . Multiple Vitamin (MULTIVITAMIN) tablet Take by mouth daily. TAKES 1/2 TABLET  . naproxen (NAPROSYN) 500 MG tablet Take 1 tablet (500 mg total) by mouth 2 (two) times daily as needed for moderate pain.  Marland Kitchen ondansetron (ZOFRAN ODT) 8 MG disintegrating tablet Take 1 tablet (8 mg total) by mouth 2 (two) times daily.  . ondansetron (ZOFRAN) 4 MG tablet Take 1 tablet (4 mg total) by mouth every 8 (eight) hours as needed for nausea or vomiting.  . pantoprazole (PROTONIX) 20 MG tablet Take 2 tablets (40 mg total) by mouth daily.  . sharps container 1 each by Does not apply route as needed. To use for insulin syringes and needles. Taking insulin at least twice daily.  E11.65  . [DISCONTINUED] cefUROXime (CEFTIN) 500 MG tablet Take 1 tablet (500 mg total) by mouth 2 (two) times daily with a  meal.  . [DISCONTINUED] cetirizine (ZYRTEC) 10 MG chewable tablet Chew 1 tablet (10 mg total) by mouth daily.  . [DISCONTINUED] insulin aspart protamine - aspart (NOVOLOG MIX 70/30 FLEXPEN) (70-30) 100 UNIT/ML FlexPen Inject 0.25 mLs (25 Units total) into the skin 2 (two) times daily.  Marland Kitchen levofloxacin (LEVAQUIN) 500 MG tablet Take 1 tablet (500 mg total) by mouth daily.  . [DISCONTINUED] benzonatate (TESSALON) 100 MG capsule Take 1 capsule (100 mg total) by mouth every 8 (eight) hours. (Patient not taking: Reported on 08/28/2019)   No  facility-administered encounter medications on file as of 09/12/2019.    Surgical History: Past Surgical History:  Procedure Laterality Date  . APPENDECTOMY    . BACK SURGERY    . CATARACT EXTRACTION W/PHACO Left 12/03/2017   Procedure: CATARACT EXTRACTION PHACO AND INTRAOCULAR LENS PLACEMENT ([place]) LEFT  DIABETIC;  Surgeon: Eulogio Bear, MD;  Location: [place];  Service: Ophthalmology;  Laterality: Left;  Diabetic - insulin  . CATARACT EXTRACTION W/PHACO Right 01/14/2018   Procedure: CATARACT EXTRACTION PHACO AND INTRAOCULAR LENS PLACEMENT ([place]) RIGHT DIABETIC;  Surgeon: Eulogio Bear, MD;  Location: [place];  Service: Ophthalmology;  Laterality: Right;  Diabetic - insulin  . CERVICAL DISC SURGERY    . CHOLECYSTECTOMY    . COLON SURGERY    . COLONOSCOPY    . COLONOSCOPY WITH PROPOFOL N/A 05/17/2015   Procedure: COLONOSCOPY WITH PROPOFOL;  Surgeon: Manya Silvas, MD;  Location: [facility] ENDOSCOPY;  Service: Endoscopy;  Laterality: N/A;  . COLONOSCOPY WITH PROPOFOL N/A 04/01/2018   Procedure: COLONOSCOPY WITH BIOPSY;  Surgeon: [name], MD;  Location: [place];  Service: Endoscopy;  Laterality: N/A;  Diabetic - insulin  . ESOPHAGOGASTRODUODENOSCOPY    . HERNIA REPAIR    . POLYPECTOMY N/A 04/01/2018   Procedure: POLYPECTOMY;  Surgeon: [name], MD;  Location: [place];  Service: Endoscopy;  Laterality: N/A;  . PORT A CATH INJECTION ([place] [facility])     Port has been removed  . TUBAL LIGATION      Medical History: Past Medical History:  Diagnosis Date  . Anxiety   . Colon cancer ([place]) 10/12/2014   had chemo  . Diabetes mellitus without complication ([place])   . Hyperlipidemia   . Hypertension   . Lateral epicondylitis   . Personal history of chemotherapy   . Polio    childhood  . Sleep apnea    no CPAP, sx improved with wt loss    Family History: Family History  Problem Relation Age of Onset  . Alzheimer's disease Mother    . Heart disease Father   . Colon cancer Sister   . Colon cancer Cousin   . Colon cancer Cousin   . Breast cancer Neg Hx     Social History   Socioeconomic History  . Marital status: Married    Spouse name: Not on file  . Number of children: Not on file  . Years of education: Not on file  . Highest education level: Not on file  Occupational History  . Not on file  Tobacco Use  . Smoking status: Former Smoker    Packs/day: 1.00    Types: Cigarettes    Quit date: 04/17/1981    Years since quitting: 38.4  . Smokeless tobacco: Never Used  Substance and Sexual Activity  . Alcohol use: No  . Drug use: No  . Sexual activity: Never  Other Topics Concern  . Not on file  Social History  Narrative  . Not on file   Social Determinants of Health   Financial Resource Strain:   . Difficulty of Paying Living Expenses:   Food Insecurity:   . Worried About Charity fundraiser in the Last Year:   . Arboriculturist in the Last Year:   Transportation Needs:   . Film/video editor (Medical):   Marland Kitchen Lack of Transportation (Non-Medical):   Physical Activity:   . Days of Exercise per Week:   . Minutes of Exercise per Session:   Stress:   . Feeling of Stress :   Social Connections:   . Frequency of Communication with Friends and Family:   . Frequency of Social Gatherings with Friends and Family:   . Attends Religious Services:   . Active Member of Clubs or Organizations:   . Attends Archivist Meetings:   Marland Kitchen Marital Status:   Intimate Partner Violence:   . Fear of Current or Ex-Partner:   . Emotionally Abused:   Marland Kitchen Physically Abused:   . Sexually Abused:       Review of Systems  Constitutional: Positive for activity change and fatigue. Negative for chills, fever and unexpected weight change.  HENT: Positive for congestion, postnasal drip, rhinorrhea, sinus pressure and sinus pain. Negative for sneezing and sore throat.   Respiratory: Positive for cough. Negative for  chest tightness, shortness of breath and wheezing.   Cardiovascular: Negative for chest pain and palpitations.  Gastrointestinal: Positive for diarrhea. Negative for abdominal pain, constipation, nausea and vomiting.  Endocrine: Negative for cold intolerance, heat intolerance, polydipsia and polyuria.       Blood sugars continue to be elevated even with increased insulin dose.   Musculoskeletal: Positive for arthralgias and myalgias. Negative for back pain, joint swelling and neck pain.  Skin: Negative for rash.  Allergic/Immunologic: Positive for environmental allergies.  Neurological: Positive for dizziness, weakness and headaches. Negative for tremors and numbness.  Hematological: Positive for adenopathy. Does not bruise/bleed easily.  Psychiatric/Behavioral: Negative for behavioral problems (Depression), sleep disturbance and suicidal ideas. The patient is nervous/anxious.     Today's Vitals   09/12/19 1509  BP: (!) 144/79  Pulse: 99  Resp: 16  Temp: 97.6 F (36.4 C)  SpO2: 97%  Weight: 153 lb 9.6 oz (69.7 kg)  Height: 5' (1.524 m)   Body mass index is 30 kg/m.   Physical Exam Vitals and nursing note reviewed.  Constitutional:      General: She is not in acute distress.    Appearance: Normal appearance. She is well-developed. She is ill-appearing. She is not diaphoretic.  HENT:     Head: Normocephalic and atraumatic.     Right Ear: Tympanic membrane normal.     Left Ear: Tympanic membrane normal.     Nose: Congestion present.     Right Turbinates: Swollen.     Right Sinus: Maxillary sinus tenderness and frontal sinus tenderness present.     Left Sinus: Maxillary sinus tenderness and frontal sinus tenderness present.     Mouth/Throat:     Pharynx: No oropharyngeal exudate.  Eyes:     Pupils: Pupils are equal, round, and reactive to light.  Neck:     Thyroid: No thyromegaly.     Vascular: No carotid bruit or JVD.     Trachea: No tracheal deviation.  Cardiovascular:      Rate and Rhythm: Normal rate and regular rhythm.     Heart sounds: Normal heart sounds. No murmur. No friction  rub. No gallop.   Pulmonary:     Effort: Pulmonary effort is normal. No respiratory distress.     Breath sounds: Normal breath sounds. No wheezing or rales.     Comments: Congested, nonproductive cough noted.  Chest:     Chest wall: No tenderness.  Abdominal:     General: Bowel sounds are normal.     Palpations: Abdomen is soft.     Tenderness: There is no abdominal tenderness.  Musculoskeletal:        General: Normal range of motion.     Cervical back: Normal range of motion and neck supple.  Lymphadenopathy:     Cervical: Cervical adenopathy present.  Skin:    General: Skin is warm and dry.  Neurological:     Mental Status: She is alert and oriented to person, place, and time.     Cranial Nerves: No cranial nerve deficit or facial asymmetry.     Sensory: Sensation is intact.     Motor: Weakness present.     Coordination: Coordination abnormal.     Gait: Gait abnormal.  Psychiatric:        Mood and Affect: Mood normal.        Behavior: Behavior normal.        Thought Content: Thought content normal.        Judgment: Judgment normal.    Assessment/Plan: 1. Acute upper respiratory infection Improving, however, not resolved. Chest x-ray was negative for evidence of acute cardiopulmonary infection or abnormality. Will d round of levofloxacin 500mg  daily for 7 days. She should continue to rest and drink plenty of fluids. Take OTC medication as needed and as indicated to treat acute symptoms.  - levofloxacin (LEVAQUIN) 500 MG tablet; Take 1 tablet (500 mg total) by mouth daily.  Dispense: 7 tablet; Refill: 0  2. Perennial allergic rhinitis Add back cetirizine every day.  - cetirizine (ZYRTEC) 10 MG chewable tablet; Chew 1 tablet (10 mg total) by mouth daily.  Dispense: 90 tablet; Refill: 1  3. Uncontrolled type 2 diabetes mellitus with hyperglycemia (HCC) Gradually  increase dose of novolog mix 70/90 to 35 units twice daily. Check blood sugars daily. Will reassess at next visit.  - insulin aspart protamine - aspart (NOVOLOG MIX 70/30 FLEXPEN) (70-30) 100 UNIT/ML FlexPen; Inject 0.35 mLs (35 Units total) into the skin 2 (two) times daily.  Dispense: 15 mL; Refill: 11  4. Diarrhea, unspecified type Unclear etiology. Will refer back to GI/oncologist for further evaluation and treatment - Ambulatory referral to Oncology  5. Personal history of colon cancer Will refer back to GI/oncologist for further evaluation and treatment - Ambulatory referral to Oncology  6. Dysuria Positive for small protein only.  - POCT Urinalysis Dipstick  General Counseling: cadia cobo understanding of the findings of todays visit and agrees with plan of treatment. I have discussed any further diagnostic evaluation that may be needed or ordered today. We also reviewed her medications today. she has been encouraged to call the office with any questions or concerns that should arise related to todays visit.  This patient was seen by Leretha Pol FNP Collaboration with Dr Lavera Guise as a part of collaborative care agreement  Orders Placed This Encounter  Procedures  . Ambulatory referral to Oncology  . POCT Urinalysis Dipstick    Meds ordered this encounter  Medications  . cetirizine (ZYRTEC) 10 MG chewable tablet    Sig: Chew 1 tablet (10 mg total) by mouth daily.    Dispense:  90 tablet    Refill:  1    Order Specific Question:   Supervising Provider    Answer:   Lavera Guise [id number]  . insulin aspart protamine - aspart (NOVOLOG MIX 70/30 FLEXPEN) (70-30) 100 UNIT/ML FlexPen    Sig: Inject 0.35 mLs (35 Units total) into the skin 2 (two) times daily.    Dispense:  15 mL    Refill:  11    Increase dose    Order Specific Question:   Supervising Provider    Answer:   Lavera Guise [id number]  . levofloxacin (LEVAQUIN) 500 MG tablet    Sig: Take 1 tablet (500 mg  total) by mouth daily.    Dispense:  7 tablet    Refill:  0    Order Specific Question:   Supervising Provider    Answer:   Lavera Guise [id number]    Total time spent: 30 Minutes   Time spent includes review of chart, medications, test results, and follow up plan with the patient.      Dr Lavera Guise Internal medicine

## 2019-09-15 DIAGNOSIS — M1990 Unspecified osteoarthritis, unspecified site: Secondary | ICD-10-CM | POA: Diagnosis not present

## 2019-09-15 DIAGNOSIS — R42 Dizziness and giddiness: Secondary | ICD-10-CM | POA: Diagnosis not present

## 2019-09-15 DIAGNOSIS — N182 Chronic kidney disease, stage 2 (mild): Secondary | ICD-10-CM | POA: Diagnosis not present

## 2019-09-15 DIAGNOSIS — I129 Hypertensive chronic kidney disease with stage 1 through stage 4 chronic kidney disease, or unspecified chronic kidney disease: Secondary | ICD-10-CM | POA: Diagnosis not present

## 2019-09-15 DIAGNOSIS — K760 Fatty (change of) liver, not elsewhere classified: Secondary | ICD-10-CM | POA: Diagnosis not present

## 2019-09-15 DIAGNOSIS — F419 Anxiety disorder, unspecified: Secondary | ICD-10-CM | POA: Diagnosis not present

## 2019-09-15 DIAGNOSIS — E782 Mixed hyperlipidemia: Secondary | ICD-10-CM | POA: Diagnosis not present

## 2019-09-15 DIAGNOSIS — J209 Acute bronchitis, unspecified: Secondary | ICD-10-CM | POA: Diagnosis not present

## 2019-09-15 DIAGNOSIS — E1122 Type 2 diabetes mellitus with diabetic chronic kidney disease: Secondary | ICD-10-CM | POA: Diagnosis not present

## 2019-09-17 DIAGNOSIS — J3089 Other allergic rhinitis: Secondary | ICD-10-CM | POA: Insufficient documentation

## 2019-09-19 ENCOUNTER — Encounter: Payer: Self-pay | Admitting: Hematology and Oncology

## 2019-09-19 DIAGNOSIS — K760 Fatty (change of) liver, not elsewhere classified: Secondary | ICD-10-CM | POA: Diagnosis not present

## 2019-09-19 DIAGNOSIS — I129 Hypertensive chronic kidney disease with stage 1 through stage 4 chronic kidney disease, or unspecified chronic kidney disease: Secondary | ICD-10-CM | POA: Diagnosis not present

## 2019-09-19 DIAGNOSIS — N182 Chronic kidney disease, stage 2 (mild): Secondary | ICD-10-CM | POA: Diagnosis not present

## 2019-09-19 DIAGNOSIS — E1122 Type 2 diabetes mellitus with diabetic chronic kidney disease: Secondary | ICD-10-CM | POA: Diagnosis not present

## 2019-09-19 DIAGNOSIS — J209 Acute bronchitis, unspecified: Secondary | ICD-10-CM | POA: Diagnosis not present

## 2019-09-19 DIAGNOSIS — M1990 Unspecified osteoarthritis, unspecified site: Secondary | ICD-10-CM | POA: Diagnosis not present

## 2019-09-19 DIAGNOSIS — R42 Dizziness and giddiness: Secondary | ICD-10-CM | POA: Diagnosis not present

## 2019-09-19 DIAGNOSIS — E782 Mixed hyperlipidemia: Secondary | ICD-10-CM | POA: Diagnosis not present

## 2019-09-19 DIAGNOSIS — F419 Anxiety disorder, unspecified: Secondary | ICD-10-CM | POA: Diagnosis not present

## 2019-09-19 NOTE — Progress Notes (Signed)
Pt was former pt of Dr. Grayland Ormond, but would like to be seen in [place] since she lives there. Pt reports she has been having a bad cough and has had 3 rounds of antibiotics but nothing has worked.

## 2019-09-21 NOTE — Progress Notes (Signed)
[facility]  [address] [place], [place] [address] Phone: [phone]  Fax: [phone]   Clinic Day:  09/22/2019  Referring physician: Ronnell Freshwater, NP  Chief Complaint: Tiffany Hawkins is a 74 y.o. female with a history of stage IIB colon cancer who is referred in consultation by Leretha Pol, NP for assessment and management.   HPI: The patient was diagnosed with stage IIB colon cancer in 10/2011. She underwent colonoscopy for routine screening on 09/29/2011.  She was noted to have a cecal polyp.  Pathology revealed minute fragmented specimen with features of at least intramucosal adenocarcinoma. Definite invasion was not identified. There were multiple other polyps (hyperplastic) in the ascending colon, hepatic flexure, transverse colon and splenic flexure. There was a tubular adenoma in the descending colon, a tubulovillous adenoma with focal high-grade dysplasia in the sigmoid colon, and a hyperplastic rectal polyp.  She underwent laparoscopic assisted right colectomy on 10/24/2011.  Pathology revealed a low-grade, moderately differentiated 1.4 cm invasive adenocarcinoma with invasion through the muscularis propria and present at the inked serosal surface. There was lymphovascular invasion but no perineural invasion. There were 18 benign lymph nodes.  There was a tubular adenoma, mixed tubulovillous and sessile serrated adenoma, sessile serrated adenoma, hyperplastic polyp and ileocecal valve lipoma. Pathologic stage was T4aN0M0.   Chest, abdomen, and pelvis CT on 11/24/2011 revealed post-operative changes in the right abdomen with no adenopathy.  There were tiny nonspecific pulmonary nodular densities in the lower lobes.  Follow-up chest CT lung screening confirmed stable bilateral pulmonary nodules.  She underwent adjuvant chemotherapy, possibly FOLFOX, and possible radiation therapy.  Treatment completed in 06/2012. She notes that she had  diarrhea with her treatment. 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 She denied nausea or vomiting.  She completed genetic testing. Results are unavailable to [place] at the time of the visit.   Chest, abdomen, and pelvis CT on 07/08/2014 revealed no evidence of disease.  Abdomen and pelvis CT on 06/22/2016 revealed stable bibasilar subpleural pulmonary nodules since 2016 cystoscopy with benign findings.  There was fatty infiltration of the liver.  There was simple bilateral renal cyst.  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  She is s/p right hemicolectomy without focal abnormality.  Colonoscopy on 05/17/2015 by Dr Gaylyn Cheers revealed a sessile serrated adenoma in the transverse colon mild active colitis with adjacent ulcer bed in the transverse colon. Pathology was negative for viral cytopathic effect, dysplasia and malignancy.  Colonoscopy on 04/01/2018 by Dr [name] revealed diverticulosis in the sigmoid and descending colon. There was a 5 mm polyp in the sigmoid colon.  Pathology revealed a tubular adenoma in the sigmoid colon. Repeat colonoscopy was planned in 5 years for surveillance.  CEA has been followed: 2.1 on 03/03/2015, 2.3 on 08/27/2015, 2.5 on 03/03/2016, 1.81 on 07/07/2016, 2.3 on 09/01/2016, 2.2 on 02/16/2017, 2.4 on 08/29/2017, and 2.5 on 03/01/2018.  She was last seen in the [facility] by Dr. Delight Hoh on 03/01/2018.  At that time, she was doing well.  She was scheduled for 6 month surveillance.  Symptomatically, she thinks she is ok in terms of her cancer. Currently she is completing physical and occupational therapy due to weakness in her knees and legs. She notes a horrible cough, unrelated to COVID-19. She notes that she frequently has diarrhea and she distinctly remembers that she was on a 10 day stint of antibiotics and had diarrhea every  day.   She believes that the weakness in her legs is a result  of sitting around during COVID-19. She states that she didn't really do anything for the last year and mostly sat around when she used to be active.    Past Medical History:  Diagnosis Date  . Anxiety   . Colon cancer ([place]) 10/12/2014   Stage IIB; had chemo  . Diabetes mellitus without complication ([place])   . Hyperlipidemia   . Hypertension   . Lateral epicondylitis   . Personal history of chemotherapy   . Polio    childhood  . Skin cancer    Sqamous Cell, Lt Calf  . Sleep apnea    no CPAP, sx improved with wt loss    Past Surgical History:  Procedure Laterality Date  . APPENDECTOMY    . BACK SURGERY    . CATARACT EXTRACTION W/PHACO Left 12/03/2017   Procedure: CATARACT EXTRACTION PHACO AND INTRAOCULAR LENS PLACEMENT ([place]) LEFT  DIABETIC;  Surgeon: Eulogio Bear, MD;  Location: [place];  Service: Ophthalmology;  Laterality: Left;  Diabetic - insulin  . CATARACT EXTRACTION W/PHACO Right 01/14/2018   Procedure: CATARACT EXTRACTION PHACO AND INTRAOCULAR LENS PLACEMENT ([place]) RIGHT DIABETIC;  Surgeon: Eulogio Bear, MD;  Location: [place];  Service: Ophthalmology;  Laterality: Right;  Diabetic - insulin  . CERVICAL DISC SURGERY    . CHOLECYSTECTOMY    . COLON SURGERY    . COLONOSCOPY    . COLONOSCOPY WITH PROPOFOL N/A 05/17/2015   Procedure: COLONOSCOPY WITH PROPOFOL;  Surgeon: Manya Silvas, MD;  Location: [facility] ENDOSCOPY;  Service: Endoscopy;  Laterality: N/A;  . COLONOSCOPY WITH PROPOFOL N/A 04/01/2018   Procedure: COLONOSCOPY WITH BIOPSY;  Surgeon: [name], MD;  Location: [place];  Service: Endoscopy;  Laterality: N/A;  Diabetic - insulin  . ESOPHAGOGASTRODUODENOSCOPY    . HERNIA REPAIR    . POLYPECTOMY N/A 04/01/2018   Procedure: POLYPECTOMY;  Surgeon: [name], MD;  Location: [place];  Service: Endoscopy;  Laterality: N/A;  . PORT A CATH INJECTION ([place] [facility])     Port has been removed  . TUBAL LIGATION       Family History  Problem Relation Age of Onset  . Alzheimer's disease Mother   . Heart disease Father   . Colon cancer Cousin   . Colon cancer Cousin   . Cancer Cousin        Brain Tumor  . Breast cancer Neg Hx     Social History:  reports that she quit smoking about 38 years ago. Her smoking use included cigarettes. She has a 30.00 pack-year smoking history. She has never used smokeless tobacco. She reports that she does not drink alcohol or use drugs. She has two children. She has 5 great great grandchildren, all who live close to her. Her husband's name is Dominica Severin.  She lives in [place].  The patient is alone today.   Allergies: No Known Allergies  Current Medications: Current Outpatient Medications  Medication Sig Dispense Refill  . amLODipine (NORVASC) 5 MG tablet TAKE 2 TABLETS EVERY DAY 180 tablet 1  . Blood Glucose Calibration (ACCU-CHEK SMARTVIEW CONTROL VI) by In Vitro route. Use as di    . cetirizine (ZYRTEC) 10 MG chewable tablet Chew 1 tablet (10 mg total) by mouth daily. 90 tablet 1  . chlorpheniramine-HYDROcodone (TUSSIONEX PENNKINETIC ER) 10-8 MG/5ML SUER Take 5 mLs by mouth every 12 (twelve) hours as needed for cough. Patient given  GoodRx card to help with COPAY cost 115 mL 0  . citalopram (CELEXA) 20 MG tablet TAKE 2 TABLETS DAILY FOR DEPRESSION. 180 tablet 1  . fenofibrate 54 MG tablet Take 1 tablet (54 mg total) by mouth daily. 90 tablet 1  . fluticasone (FLONASE) 50 MCG/ACT nasal spray Place 2 sprays into both nostrils daily. 48 g 1  . glucose blood (ACCU-CHEK SMARTVIEW) test strip Blood sugar checks TID DX E11.65 100 each 11  . insulin aspart protamine - aspart (NOVOLOG MIX 70/30 FLEXPEN) (70-30) 100 UNIT/ML FlexPen Inject 0.35 mLs (35 Units total) into the skin 2 (two) times daily. 15 mL 11  . lisinopril (ZESTRIL) 5 MG tablet Take 1 tablet (5 mg total) by mouth daily. 90 tablet 1  . lovastatin (MEVACOR) 20 MG tablet TAKE 1 TABLET AT BEDTIME FOR HIGH CHOLESTEROL 90  tablet 1  . metaxalone (SKELAXIN) 800 MG tablet Take 1 tablet (800 mg total) by mouth 2 (two) times daily as needed for muscle spasms. 21 tablet 0  . Multiple Vitamin (MULTIVITAMIN) tablet Take by mouth daily. TAKES 1/2 TABLET    . ondansetron (ZOFRAN ODT) 8 MG disintegrating tablet Take 1 tablet (8 mg total) by mouth 2 (two) times daily. 6 tablet 0  . ondansetron (ZOFRAN) 4 MG tablet Take 1 tablet (4 mg total) by mouth every 8 (eight) hours as needed for nausea or vomiting. 30 tablet 2  . pantoprazole (PROTONIX) 20 MG tablet Take 2 tablets (40 mg total) by mouth daily. 90 tablet 1  . Phenylephrine-APAP-guaiFENesin ([place] FAST-MAX) 10-650-400 MG/20ML LIQD Take by mouth.    . sharps container 1 each by Does not apply route as needed. To use for insulin syringes and needles. Taking insulin at least twice daily.  E11.65 1 each 5  . ibuprofen (ADVIL) 800 MG tablet Take 1 tablet (800 mg total) by mouth every 8 (eight) hours as needed. (Patient not taking: Reported on 09/19/2019) 90 tablet 1  . levofloxacin (LEVAQUIN) 500 MG tablet Take 1 tablet (500 mg total) by mouth daily. (Patient not taking: Reported on 09/19/2019) 7 tablet 0  . naproxen (NAPROSYN) 500 MG tablet Take 1 tablet (500 mg total) by mouth 2 (two) times daily as needed for moderate pain. (Patient not taking: Reported on 09/19/2019) 20 tablet 0   No current facility-administered medications for this visit.    Review of Systems  Constitutional: Negative for chills, diaphoresis, fever, malaise/fatigue and weight loss.  HENT: Negative for congestion and sore throat.   Eyes: Positive for blurred vision (cataracts). Negative for double vision.  Respiratory: Positive for cough (dry, chronic) and shortness of breath (with exertion). Negative for sputum production.   Cardiovascular: Negative for chest pain, palpitations and leg swelling.  Gastrointestinal: Positive for diarrhea (Intermittent). Negative for constipation, heartburn, nausea and  vomiting.  Genitourinary: Negative for frequency and urgency.       Mild incontinence  Musculoskeletal: Positive for back pain and joint pain (knees). Negative for falls and myalgias.  Skin: Negative for rash.  Neurological: Positive for weakness (legs, ambulates with cane). Negative for dizziness, sensory change and headaches.       Balance issues  Endo/Heme/Allergies: Does not bruise/bleed easily.  Psychiatric/Behavioral: Negative for depression. The patient is not nervous/anxious.    Performance status (ECOG): 1  Vitals Blood pressure 130/71, pulse 94, temperature (!) 96.9 F (36.1 C), temperature source Tympanic, resp. rate 16, weight 155 lb 3.3 oz (70.4 kg), SpO2 98 %.   Physical Exam Vitals and nursing note  reviewed.  Constitutional:      General: She is not in acute distress.    Appearance: Normal appearance. She is well-developed and well-nourished.     Interventions: Face mask in place.  HENT:     Head: Normocephalic and atraumatic.     Mouth/Throat:     Mouth: Oropharynx is clear and moist and mucous membranes are normal. No oral lesions.      Comments: Short silver/gray hair. Eyes:     General: No scleral icterus.    Extraocular Movements: EOM normal.     Conjunctiva/sclera: Conjunctivae normal.     Pupils: Pupils are equal, round, and reactive to light.     Comments: Brown eyes.  Neck:     Vascular: No JVD.  Cardiovascular:     Rate and Rhythm: Normal rate and regular rhythm.     Heart sounds: Normal heart sounds. No murmur heard.  No friction rub. No gallop.   Pulmonary:     Effort: Pulmonary effort is normal.     Breath sounds: Normal breath sounds. No wheezing, rhonchi or rales.  Abdominal:     General: Bowel sounds are normal. There is no distension.     Palpations: Abdomen is soft. There is no hepatosplenomegaly or mass.     Tenderness: There is abdominal tenderness (mild-  left mid lateral quadrant). There is no guarding or rebound.  Musculoskeletal:         General: No tenderness or edema. Normal range of motion.     Cervical back: Normal range of motion and neck supple.  Lymphadenopathy:     Head:     Right side of head: No preauricular, posterior auricular or occipital adenopathy.     Left side of head: No preauricular, posterior auricular or occipital adenopathy.     Cervical: No cervical adenopathy.     Upper Body:  No axillary adenopathy present.    Right upper body: No supraclavicular adenopathy.     Left upper body: No supraclavicular adenopathy.     Lower Body: No right inguinal adenopathy. No left inguinal adenopathy.  Skin:    General: Skin is warm, dry and intact.     Coloration: Skin is not pale.     Findings: No bruising, erythema, lesion or rash.  Neurological:     Mental Status: She is alert and oriented to person, place, and time.  Psychiatric:        Mood and Affect: Mood and affect normal.        Behavior: Behavior normal.        Thought Content: Thought content normal.        Judgment: Judgment normal.    No visits with results within 3 Day(s) from this visit.  Latest known visit with results is:  Office Visit on 09/12/2019  Component Date Value Ref Range Status  . Glucose, UA 09/12/2019 Negative  Negative Final  . Bilirubin, UA 09/12/2019 Negative   Final  . Ketones, UA 09/12/2019 small   Final  . Spec Grav, UA 09/12/2019 1.015  1.010 - 1.025 Final  . Blood, UA 09/12/2019 Negative   Final  . pH, UA 09/12/2019 5.0  5.0 - 8.0 Final  . Protein, UA 09/12/2019 Positive* Negative Final   small  . Urobilinogen, UA 09/12/2019 0.2  0.2 or 1.0 E.U./dL Final  . Nitrite, UA 09/12/2019 Negative   Final  . Leukocytes, UA 09/12/2019 Negative  Negative Final    Assessment:  Tiffany Hawkins is a 74 y.o.  female with stage IIB colon cancer s/p laparoscopic assisted right colectomy on 10/24/2011.  Pathology revealed a low-grade, moderately differentiated 1.4 cm invasive adenocarcinoma with invasion through the muscularis  propria and present at the inked serosal surface. There was lymphovascular invasion but no perineural invasion. There were 18 benign lymph nodes.  There was a tubular adenoma, mixed tubulovillous and sessile serrated adenoma, sessile serrated adenoma, hyperplastic polyp and ileocecal valve lipoma. Pathologic stage was T4aN0M0.   She underwent adjuvant chemotherapy and radiation therapy.  Treatment completed in 06/2012.   Chest, abdomen, and pelvis CT on 11/24/2011 revealed post-operative changes in the right abdomen with no adenopathy.  There were tiny nonspecific pulmonary nodular densities in the lower lobes.  Chest, abdomen, and pelvis CT on 07/08/2014 revealed no evidence of disease.  Abdomen and pelvis CT on 06/22/2016 revealed stable bibasilar subpleural pulmonary nodules since 2016 c/w benign findings.  There was fatty infiltration of the liver.  There was simple bilateral renal cyst.  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  She is s/p right hemicolectomy without focal abnormality.  Colonoscopy on 05/17/2015 revealed a sessile serrated adenoma in the transverse colon mild active colitis with adjacent ulcer bed in the transverse colon.  Pathology was negative for viral cytopathic effect, dysplasia and malignancy.  Colonoscopy on 04/01/2018 revealed diverticulosis in the sigmoid and descending colon. There was a 5 mm polyp in the sigmoid colon.  Pathology revealed a tubular adenoma in the sigmoid colon. Repeat colonoscopy was planned in 5 years for surveillance.  CEA has been followed: 2.1 on 03/03/2015, 2.3 on 08/27/2015, 2.5 on 03/03/2016, 1.81 on 07/07/2016, 2.3 on 09/01/2016, 2.2 on 02/16/2017, 2.4 on 08/29/2017, and 2.5 on 03/01/2018.  Symptomatically, he denies any abdominal pain.  She has had some diarrhea following her course of antibiotics.  Exam reveals no adenopathy or hepatosplenomegaly.  There are no palpable abdominal  masses.  Plan: 1.   Labs today:  CBC with diff, CMP, CEA. 2.   Stage IIB colon cancer  Review entire medical history, diagnosis and management of colon cancer.  By history, as she appears to have received FOLFOX chemotherapy.  Obtain outside records. 3.   RTC in 1 year for MD assessment and labs (CBC with diff, CMP, CEA).   I discussed the assessment and treatment plan with the patient.  The patient was provided an opportunity to ask questions and all were answered.  The patient agreed with the plan and demonstrated an understanding of the instructions.  The patient was advised to call back if the symptoms worsen or if the condition fails to improve as anticipated.  I provided 37 minutes (1:52 PM - 2:29 PM) of face-to-face time during this this encounter and > 50% was spent counseling as documented under my assessment and plan. An additional 10 minutes were spent reviewing her chart (Epic and Care Everywhere) including notes, labs, and imaging studies.    Ardyth Kelso C. Mike Gip, MD, PhD    09/22/2019, 2:29 PM  I, Jacqualyn Posey, am acting as Education administrator for [employer]. Mike Gip, MD, PhD.  I, Jessina Marse C. Mike Gip, MD, have reviewed the above documentation for accuracy and completeness, and I agree with the above.

## 2019-09-22 ENCOUNTER — Encounter: Payer: Self-pay | Admitting: Hematology and Oncology

## 2019-09-22 ENCOUNTER — Inpatient Hospital Stay: Payer: Medicare HMO

## 2019-09-22 ENCOUNTER — Inpatient Hospital Stay: Payer: Medicare HMO | Attending: Hematology and Oncology | Admitting: Hematology and Oncology

## 2019-09-22 ENCOUNTER — Other Ambulatory Visit: Payer: Self-pay

## 2019-09-22 VITALS — BP 130/71 | HR 94 | Temp 96.9°F | Resp 16 | Wt 155.2 lb

## 2019-09-22 DIAGNOSIS — E1140 Type 2 diabetes mellitus with diabetic neuropathy, unspecified: Secondary | ICD-10-CM | POA: Insufficient documentation

## 2019-09-22 DIAGNOSIS — Z87898 Personal history of other specified conditions: Secondary | ICD-10-CM

## 2019-09-22 DIAGNOSIS — Z818 Family history of other mental and behavioral disorders: Secondary | ICD-10-CM | POA: Insufficient documentation

## 2019-09-22 DIAGNOSIS — Z8249 Family history of ischemic heart disease and other diseases of the circulatory system: Secondary | ICD-10-CM | POA: Diagnosis not present

## 2019-09-22 DIAGNOSIS — E785 Hyperlipidemia, unspecified: Secondary | ICD-10-CM | POA: Insufficient documentation

## 2019-09-22 DIAGNOSIS — Z9221 Personal history of antineoplastic chemotherapy: Secondary | ICD-10-CM | POA: Diagnosis not present

## 2019-09-22 DIAGNOSIS — Z85828 Personal history of other malignant neoplasm of skin: Secondary | ICD-10-CM | POA: Diagnosis not present

## 2019-09-22 DIAGNOSIS — D125 Benign neoplasm of sigmoid colon: Secondary | ICD-10-CM | POA: Insufficient documentation

## 2019-09-22 DIAGNOSIS — Z79899 Other long term (current) drug therapy: Secondary | ICD-10-CM | POA: Insufficient documentation

## 2019-09-22 DIAGNOSIS — I10 Essential (primary) hypertension: Secondary | ICD-10-CM | POA: Insufficient documentation

## 2019-09-22 DIAGNOSIS — R197 Diarrhea, unspecified: Secondary | ICD-10-CM | POA: Diagnosis not present

## 2019-09-22 DIAGNOSIS — D120 Benign neoplasm of cecum: Secondary | ICD-10-CM | POA: Insufficient documentation

## 2019-09-22 DIAGNOSIS — R918 Other nonspecific abnormal finding of lung field: Secondary | ICD-10-CM | POA: Diagnosis not present

## 2019-09-22 DIAGNOSIS — Z85038 Personal history of other malignant neoplasm of large intestine: Secondary | ICD-10-CM | POA: Insufficient documentation

## 2019-09-22 DIAGNOSIS — Z808 Family history of malignant neoplasm of other organs or systems: Secondary | ICD-10-CM | POA: Insufficient documentation

## 2019-09-22 DIAGNOSIS — C182 Malignant neoplasm of ascending colon: Secondary | ICD-10-CM

## 2019-09-22 DIAGNOSIS — Z87891 Personal history of nicotine dependence: Secondary | ICD-10-CM | POA: Diagnosis not present

## 2019-09-22 DIAGNOSIS — R05 Cough: Secondary | ICD-10-CM | POA: Insufficient documentation

## 2019-09-22 DIAGNOSIS — R053 Chronic cough: Secondary | ICD-10-CM | POA: Insufficient documentation

## 2019-09-22 LAB — COMPREHENSIVE METABOLIC PANEL
ALT: 29 U/L (ref 0–44)
AST: 22 U/L (ref 15–41)
Albumin: 4.1 g/dL (ref 3.5–5.0)
Alkaline Phosphatase: 46 U/L (ref 38–126)
Anion gap: 7 (ref 5–15)
BUN: 23 mg/dL (ref 8–23)
CO2: 25 mmol/L (ref 22–32)
Calcium: 8.6 mg/dL — ABNORMAL LOW (ref 8.9–10.3)
Chloride: 107 mmol/L (ref 98–111)
Creatinine, Ser: 1.09 mg/dL — ABNORMAL HIGH (ref 0.44–1.00)
GFR calc Af Amer: 58 mL/min — ABNORMAL LOW (ref >60)
GFR calc non Af Amer: 50 mL/min — ABNORMAL LOW (ref >60)
Glucose, Bld: 205 mg/dL — ABNORMAL HIGH (ref 70–99)
Potassium: 3.6 mmol/L (ref 3.5–5.1)
Sodium: 139 mmol/L (ref 135–145)
Total Bilirubin: 0.6 mg/dL (ref 0.3–1.2)
Total Protein: 7.2 g/dL (ref 6.5–8.1)

## 2019-09-22 LAB — CBC WITH DIFFERENTIAL/PLATELET
Abs Immature Granulocytes: 0.07 10*3/uL (ref 0.00–0.07)
Basophils Absolute: 0.1 10*3/uL (ref 0.0–0.1)
Basophils Relative: 1 %
Eosinophils Absolute: 0.3 10*3/uL (ref 0.0–0.5)
Eosinophils Relative: 4 %
HCT: 40.8 % (ref 36.0–46.0)
Hemoglobin: 13.8 g/dL (ref 12.0–15.0)
Immature Granulocytes: 1 %
Lymphocytes Relative: 26 %
Lymphs Abs: 2.2 10*3/uL (ref 0.7–4.0)
MCH: 28.9 pg (ref 26.0–34.0)
MCHC: 33.8 g/dL (ref 30.0–36.0)
MCV: 85.5 fL (ref 80.0–100.0)
Monocytes Absolute: 0.9 10*3/uL (ref 0.1–1.0)
Monocytes Relative: 11 %
Neutro Abs: 4.9 10*3/uL (ref 1.7–7.7)
Neutrophils Relative %: 57 %
Platelets: 193 10*3/uL (ref 150–400)
RBC: 4.77 MIL/uL (ref 3.87–5.11)
RDW: 13.1 % (ref 11.5–15.5)
WBC: 8.5 10*3/uL (ref 4.0–10.5)
nRBC: 0 % (ref 0.0–0.2)

## 2019-09-23 DIAGNOSIS — I129 Hypertensive chronic kidney disease with stage 1 through stage 4 chronic kidney disease, or unspecified chronic kidney disease: Secondary | ICD-10-CM | POA: Diagnosis not present

## 2019-09-23 DIAGNOSIS — M1990 Unspecified osteoarthritis, unspecified site: Secondary | ICD-10-CM | POA: Diagnosis not present

## 2019-09-23 DIAGNOSIS — N182 Chronic kidney disease, stage 2 (mild): Secondary | ICD-10-CM | POA: Diagnosis not present

## 2019-09-23 DIAGNOSIS — K760 Fatty (change of) liver, not elsewhere classified: Secondary | ICD-10-CM | POA: Diagnosis not present

## 2019-09-23 DIAGNOSIS — R42 Dizziness and giddiness: Secondary | ICD-10-CM | POA: Diagnosis not present

## 2019-09-23 DIAGNOSIS — E782 Mixed hyperlipidemia: Secondary | ICD-10-CM | POA: Diagnosis not present

## 2019-09-23 DIAGNOSIS — E1122 Type 2 diabetes mellitus with diabetic chronic kidney disease: Secondary | ICD-10-CM | POA: Diagnosis not present

## 2019-09-23 DIAGNOSIS — J209 Acute bronchitis, unspecified: Secondary | ICD-10-CM | POA: Diagnosis not present

## 2019-09-23 DIAGNOSIS — F419 Anxiety disorder, unspecified: Secondary | ICD-10-CM | POA: Diagnosis not present

## 2019-09-23 LAB — CEA: CEA: 2.6 ng/mL (ref 0.0–4.7)

## 2019-09-24 ENCOUNTER — Other Ambulatory Visit: Payer: Self-pay

## 2019-09-24 DIAGNOSIS — J209 Acute bronchitis, unspecified: Secondary | ICD-10-CM | POA: Diagnosis not present

## 2019-09-24 DIAGNOSIS — E1122 Type 2 diabetes mellitus with diabetic chronic kidney disease: Secondary | ICD-10-CM | POA: Diagnosis not present

## 2019-09-24 DIAGNOSIS — K760 Fatty (change of) liver, not elsewhere classified: Secondary | ICD-10-CM | POA: Diagnosis not present

## 2019-09-24 DIAGNOSIS — E782 Mixed hyperlipidemia: Secondary | ICD-10-CM | POA: Diagnosis not present

## 2019-09-24 DIAGNOSIS — I129 Hypertensive chronic kidney disease with stage 1 through stage 4 chronic kidney disease, or unspecified chronic kidney disease: Secondary | ICD-10-CM | POA: Diagnosis not present

## 2019-09-24 DIAGNOSIS — N182 Chronic kidney disease, stage 2 (mild): Secondary | ICD-10-CM | POA: Diagnosis not present

## 2019-09-24 DIAGNOSIS — I10 Essential (primary) hypertension: Secondary | ICD-10-CM

## 2019-09-24 DIAGNOSIS — M1990 Unspecified osteoarthritis, unspecified site: Secondary | ICD-10-CM | POA: Diagnosis not present

## 2019-09-24 DIAGNOSIS — F419 Anxiety disorder, unspecified: Secondary | ICD-10-CM | POA: Diagnosis not present

## 2019-09-24 DIAGNOSIS — R42 Dizziness and giddiness: Secondary | ICD-10-CM | POA: Diagnosis not present

## 2019-09-24 MED ORDER — LISINOPRIL 5 MG PO TABS: 5.0000 mg | ORAL_TABLET | Freq: Every day | ORAL | 1 refills | Status: DC

## 2019-09-25 DIAGNOSIS — J209 Acute bronchitis, unspecified: Secondary | ICD-10-CM | POA: Diagnosis not present

## 2019-09-25 DIAGNOSIS — R42 Dizziness and giddiness: Secondary | ICD-10-CM | POA: Diagnosis not present

## 2019-09-25 DIAGNOSIS — F419 Anxiety disorder, unspecified: Secondary | ICD-10-CM | POA: Diagnosis not present

## 2019-09-25 DIAGNOSIS — M1990 Unspecified osteoarthritis, unspecified site: Secondary | ICD-10-CM | POA: Diagnosis not present

## 2019-09-25 DIAGNOSIS — N182 Chronic kidney disease, stage 2 (mild): Secondary | ICD-10-CM | POA: Diagnosis not present

## 2019-09-25 DIAGNOSIS — K760 Fatty (change of) liver, not elsewhere classified: Secondary | ICD-10-CM | POA: Diagnosis not present

## 2019-09-25 DIAGNOSIS — E1122 Type 2 diabetes mellitus with diabetic chronic kidney disease: Secondary | ICD-10-CM | POA: Diagnosis not present

## 2019-09-25 DIAGNOSIS — E782 Mixed hyperlipidemia: Secondary | ICD-10-CM | POA: Diagnosis not present

## 2019-09-25 DIAGNOSIS — I129 Hypertensive chronic kidney disease with stage 1 through stage 4 chronic kidney disease, or unspecified chronic kidney disease: Secondary | ICD-10-CM | POA: Diagnosis not present

## 2019-09-29 ENCOUNTER — Other Ambulatory Visit: Payer: Self-pay

## 2019-09-29 DIAGNOSIS — J209 Acute bronchitis, unspecified: Secondary | ICD-10-CM | POA: Diagnosis not present

## 2019-09-29 DIAGNOSIS — K760 Fatty (change of) liver, not elsewhere classified: Secondary | ICD-10-CM | POA: Diagnosis not present

## 2019-09-29 DIAGNOSIS — K219 Gastro-esophageal reflux disease without esophagitis: Secondary | ICD-10-CM

## 2019-09-29 DIAGNOSIS — I129 Hypertensive chronic kidney disease with stage 1 through stage 4 chronic kidney disease, or unspecified chronic kidney disease: Secondary | ICD-10-CM | POA: Diagnosis not present

## 2019-09-29 DIAGNOSIS — E1122 Type 2 diabetes mellitus with diabetic chronic kidney disease: Secondary | ICD-10-CM | POA: Diagnosis not present

## 2019-09-29 DIAGNOSIS — F419 Anxiety disorder, unspecified: Secondary | ICD-10-CM | POA: Diagnosis not present

## 2019-09-29 DIAGNOSIS — E782 Mixed hyperlipidemia: Secondary | ICD-10-CM | POA: Diagnosis not present

## 2019-09-29 DIAGNOSIS — R42 Dizziness and giddiness: Secondary | ICD-10-CM | POA: Diagnosis not present

## 2019-09-29 DIAGNOSIS — M1990 Unspecified osteoarthritis, unspecified site: Secondary | ICD-10-CM | POA: Diagnosis not present

## 2019-09-29 DIAGNOSIS — N182 Chronic kidney disease, stage 2 (mild): Secondary | ICD-10-CM | POA: Diagnosis not present

## 2019-09-29 MED ORDER — PANTOPRAZOLE SODIUM 20 MG PO TBEC: 40.0000 mg | DELAYED_RELEASE_TABLET | Freq: Every day | ORAL | 1 refills | Status: DC

## 2019-10-01 DIAGNOSIS — E782 Mixed hyperlipidemia: Secondary | ICD-10-CM | POA: Diagnosis not present

## 2019-10-01 DIAGNOSIS — I129 Hypertensive chronic kidney disease with stage 1 through stage 4 chronic kidney disease, or unspecified chronic kidney disease: Secondary | ICD-10-CM | POA: Diagnosis not present

## 2019-10-01 DIAGNOSIS — R42 Dizziness and giddiness: Secondary | ICD-10-CM | POA: Diagnosis not present

## 2019-10-01 DIAGNOSIS — J209 Acute bronchitis, unspecified: Secondary | ICD-10-CM | POA: Diagnosis not present

## 2019-10-01 DIAGNOSIS — M1990 Unspecified osteoarthritis, unspecified site: Secondary | ICD-10-CM | POA: Diagnosis not present

## 2019-10-01 DIAGNOSIS — K760 Fatty (change of) liver, not elsewhere classified: Secondary | ICD-10-CM | POA: Diagnosis not present

## 2019-10-01 DIAGNOSIS — E1122 Type 2 diabetes mellitus with diabetic chronic kidney disease: Secondary | ICD-10-CM | POA: Diagnosis not present

## 2019-10-01 DIAGNOSIS — F419 Anxiety disorder, unspecified: Secondary | ICD-10-CM | POA: Diagnosis not present

## 2019-10-01 DIAGNOSIS — N182 Chronic kidney disease, stage 2 (mild): Secondary | ICD-10-CM | POA: Diagnosis not present

## 2019-10-02 ENCOUNTER — Encounter: Payer: Self-pay | Admitting: Nurse Practitioner

## 2019-10-02 ENCOUNTER — Ambulatory Visit (INDEPENDENT_AMBULATORY_CARE_PROVIDER_SITE_OTHER): Payer: Medicare HMO | Admitting: Nurse Practitioner

## 2019-10-02 ENCOUNTER — Other Ambulatory Visit: Payer: Self-pay

## 2019-10-02 VITALS — BP 141/77 | HR 96 | Temp 97.8°F | Resp 16 | Ht 60.0 in | Wt 154.2 lb

## 2019-10-02 DIAGNOSIS — J069 Acute upper respiratory infection, unspecified: Secondary | ICD-10-CM

## 2019-10-02 DIAGNOSIS — I10 Essential (primary) hypertension: Secondary | ICD-10-CM

## 2019-10-02 DIAGNOSIS — E1165 Type 2 diabetes mellitus with hyperglycemia: Secondary | ICD-10-CM | POA: Diagnosis not present

## 2019-10-02 DIAGNOSIS — R531 Weakness: Secondary | ICD-10-CM | POA: Diagnosis not present

## 2019-10-02 MED ORDER — NOVOLOG MIX 70/30 FLEXPEN (70-30) 100 UNIT/ML ~~LOC~~ SUPN: 35.0000 [IU] | PEN_INJECTOR | Freq: Two times a day (BID) | SUBCUTANEOUS | 11 refills | Status: DC

## 2019-10-02 NOTE — Progress Notes (Signed)
[facility] [place], [place] [address]  Internal MEDICINE  Office Visit Note  Patient Name: Tiffany Hawkins  [id number]  [id number]  Date of Service: 10/12/2019  Chief Complaint  Patient presents with  . Follow-up  . Diabetes  . Hyperlipidemia  . Hypertension    Patient is here for routine follow up. States that she is doing better. She had been treated for significant upper respiratory infection. Had to do second round antibiotics. She had chest x-ray which was negative for pneumonia or other cardiopulmonary disease. Since completion of antibiotics, she is feeling much better. She states that she continues to have non-productive cough, but is otherwise, feeling better. She also states that her blood sugars are improving. They are still elevated, but better than after last visit. She is having physical therapy out to her home to help her with dizziness and building strength. 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       Current Medication: Outpatient Encounter Medications as of 10/02/2019  Medication Sig  . amLODipine (NORVASC) 5 MG tablet TAKE 2 TABLETS EVERY DAY  . Blood Glucose Calibration (ACCU-CHEK SMARTVIEW CONTROL VI) by In Vitro route. Use as di  . cetirizine (ZYRTEC) 10 MG chewable tablet Chew 1 tablet (10 mg total) by mouth daily.  . citalopram (CELEXA) 20 MG tablet TAKE 2 TABLETS DAILY FOR DEPRESSION.  . fenofibrate 54 MG tablet Take 1 tablet (54 mg total) by mouth daily.  . fluticasone (FLONASE) 50 MCG/ACT nasal spray Place 2 sprays into both nostrils daily.  Marland Kitchen glucose blood (ACCU-CHEK SMARTVIEW) test strip Blood sugar checks TID DX E11.65  . ibuprofen (ADVIL) 800 MG tablet Take 1 tablet (800 mg total) by mouth every 8 (eight) hours as needed.  . insulin aspart protamine - aspart (NOVOLOG MIX 70/30 FLEXPEN) (70-30) 100 UNIT/ML FlexPen Inject 0.35 mLs (35 Units  total) into the skin 2 (two) times daily. Inject 35 units [place] QAM. Gradually increase to 40units [place] QPM  . lisinopril (ZESTRIL) 5 MG tablet Take 1 tablet (5 mg total) by mouth daily.  Marland Kitchen lovastatin (MEVACOR) 20 MG tablet TAKE 1 TABLET AT BEDTIME FOR HIGH CHOLESTEROL  . metaxalone (SKELAXIN) 800 MG tablet Take 1 tablet (800 mg total) by mouth 2 (two) times daily as needed for muscle spasms.  . Multiple Vitamin (MULTIVITAMIN) tablet Take by mouth daily. TAKES 1/2 TABLET  . naproxen (NAPROSYN) 500 MG tablet Take 1 tablet (500 mg total) by mouth 2 (two) times daily as needed for moderate pain.  Marland Kitchen ondansetron (ZOFRAN ODT) 8 MG disintegrating tablet Take 1 tablet (8 mg total) by mouth 2 (two) times daily.  . ondansetron (ZOFRAN) 4 MG tablet Take 1 tablet (4 mg total) by mouth every 8 (eight) hours as needed for nausea or vomiting.  . pantoprazole (PROTONIX) 20 MG tablet Take 2 tablets (40 mg total) by mouth daily.  Marland Kitchen Phenylephrine-APAP-guaiFENesin ([place] FAST-MAX) 10-650-400 MG/20ML LIQD Take by mouth.  . sharps container 1 each by Does not apply route as needed. To use for insulin syringes and needles. Taking insulin at least twice daily.  E11.65  . [DISCONTINUED] chlorpheniramine-HYDROcodone (TUSSIONEX PENNKINETIC ER) 10-8 MG/5ML SUER Take 5 mLs by mouth every 12 (twelve) hours as needed for cough. Patient given GoodRx card to help with COPAY cost  . [DISCONTINUED] insulin aspart protamine - aspart (NOVOLOG MIX 70/30 FLEXPEN) (70-30) 100  UNIT/ML FlexPen Inject 0.35 mLs (35 Units total) into the skin 2 (two) times daily.  . [DISCONTINUED] levofloxacin (LEVAQUIN) 500 MG tablet Take 1 tablet (500 mg total) by mouth daily.   No facility-administered encounter medications on file as of 10/02/2019.    Surgical History: Past Surgical History:  Procedure Laterality Date  . APPENDECTOMY    . BACK SURGERY    . CATARACT EXTRACTION W/PHACO Left 12/03/2017   Procedure: CATARACT EXTRACTION PHACO AND INTRAOCULAR  LENS PLACEMENT ([place]) LEFT  DIABETIC;  Surgeon: Eulogio Bear, MD;  Location: [place];  Service: Ophthalmology;  Laterality: Left;  Diabetic - insulin  . CATARACT EXTRACTION W/PHACO Right 01/14/2018   Procedure: CATARACT EXTRACTION PHACO AND INTRAOCULAR LENS PLACEMENT ([place]) RIGHT DIABETIC;  Surgeon: Eulogio Bear, MD;  Location: [place];  Service: Ophthalmology;  Laterality: Right;  Diabetic - insulin  . CERVICAL DISC SURGERY    . CHOLECYSTECTOMY    . COLON SURGERY    . COLONOSCOPY    . COLONOSCOPY WITH PROPOFOL N/A 05/17/2015   Procedure: COLONOSCOPY WITH PROPOFOL;  Surgeon: Manya Silvas, MD;  Location: [facility] ENDOSCOPY;  Service: Endoscopy;  Laterality: N/A;  . COLONOSCOPY WITH PROPOFOL N/A 04/01/2018   Procedure: COLONOSCOPY WITH BIOPSY;  Surgeon: [name], MD;  Location: [place];  Service: Endoscopy;  Laterality: N/A;  Diabetic - insulin  . ESOPHAGOGASTRODUODENOSCOPY    . HERNIA REPAIR    . POLYPECTOMY N/A 04/01/2018   Procedure: POLYPECTOMY;  Surgeon: [name], MD;  Location: [place];  Service: Endoscopy;  Laterality: N/A;  . PORT A CATH INJECTION ([place] [facility])     Port has been removed  . TUBAL LIGATION      Medical History: Past Medical History:  Diagnosis Date  . Anxiety   . Colon cancer ([place]) 10/12/2014   Stage IIB; had chemo  . Diabetes mellitus without complication ([place])   . Hyperlipidemia   . Hypertension   . Lateral epicondylitis   . Personal history of chemotherapy   . Polio    childhood  . Skin cancer    Sqamous Cell, Lt Calf  . Sleep apnea    no CPAP, sx improved with wt loss    Family History: Family History  Problem Relation Age of Onset  . Alzheimer's disease Mother   . Heart disease Father   . Colon cancer Cousin   . Colon cancer Cousin   . Cancer Cousin        Brain Tumor  . Breast cancer Neg Hx     Social History   Socioeconomic History  . Marital status: Married    Spouse name:  Not on file  . Number of children: Not on file  . Years of education: Not on file  . Highest education level: Not on file  Occupational History  . Not on file  Tobacco Use  . Smoking status: Former Smoker    Packs/day: 1.50    Years: 20.00    Pack years: 30.00    Types: Cigarettes    Quit date: 04/17/1981    Years since quitting: 38.5  . Smokeless tobacco: Never Used  Vaping Use  . Vaping Use: Never used  Substance and Sexual Activity  . Alcohol use: No  . Drug use: No  . Sexual activity: Never  Other Topics Concern  . Not on file  Social History Narrative  . Not on file   Social Determinants of Health   Financial Resource Strain:   .  Difficulty of Paying Living Expenses:   Food Insecurity:   . Worried About Charity fundraiser in the Last Year:   . Arboriculturist in the Last Year:   Transportation Needs:   . Film/video editor (Medical):   Marland Kitchen Lack of Transportation (Non-Medical):   Physical Activity:   . Days of Exercise per Week:   . Minutes of Exercise per Session:   Stress:   . Feeling of Stress :   Social Connections:   . Frequency of Communication with Friends and Family:   . Frequency of Social Gatherings with Friends and Family:   . Attends Religious Services:   . Active Member of Clubs or Organizations:   . Attends Archivist Meetings:   Marland Kitchen Marital Status:   Intimate Partner Violence:   . Fear of Current or Ex-Partner:   . Emotionally Abused:   Marland Kitchen Physically Abused:   . Sexually Abused:       Review of Systems  Constitutional: Positive for activity change and fatigue. Negative for chills, fever and unexpected weight change.       Improved fatigue and activity levels since her last visit .  HENT: Positive for postnasal drip. Negative for congestion, rhinorrhea, sinus pressure, sinus pain, sneezing and sore throat.   Respiratory: Positive for cough. Negative for chest tightness, shortness of breath and wheezing.   Cardiovascular: Negative  for chest pain and palpitations.  Gastrointestinal: Negative for abdominal pain, constipation, diarrhea, nausea and vomiting.       Diarrhea has improved since her most recent treatment with antibiotics.   Endocrine: Negative for cold intolerance, heat intolerance, polydipsia and polyuria.       Slowly improving blood sugars.   Musculoskeletal: Positive for arthralgias and myalgias. Negative for back pain, joint swelling and neck pain.  Skin: Negative for rash.  Allergic/Immunologic: Positive for environmental allergies.  Neurological: Positive for dizziness and weakness. Negative for tremors, light-headedness, numbness and headaches.  Hematological: Negative for adenopathy. Does not bruise/bleed easily.  Psychiatric/Behavioral: Negative for behavioral problems (Depression), sleep disturbance and suicidal ideas. The patient is nervous/anxious.     Today's Vitals   10/02/19 0948  BP: (!) 141/77  Pulse: 96  Resp: 16  Temp: 97.8 F (36.6 C)  SpO2: 94%  Weight: 154 lb 3.2 oz (69.9 kg)  Height: 5' (1.524 m)   Body mass index is 30.12 kg/m.  Physical Exam Vitals and nursing note reviewed.  Constitutional:      General: She is not in acute distress.    Appearance: Normal appearance. She is well-developed. She is not ill-appearing or diaphoretic.  HENT:     Head: Normocephalic and atraumatic.     Right Ear: Tympanic membrane normal.     Left Ear: Tympanic membrane normal.     Nose: No congestion.     Right Turbinates: Not swollen.     Right Sinus: No maxillary sinus tenderness or frontal sinus tenderness.     Left Sinus: No maxillary sinus tenderness or frontal sinus tenderness.     Mouth/Throat:     Pharynx: No oropharyngeal exudate.  Eyes:     Pupils: Pupils are equal, round, and reactive to light.  Neck:     Thyroid: No thyromegaly.     Vascular: No carotid bruit or JVD.     Trachea: No tracheal deviation.  Cardiovascular:     Rate and Rhythm: Normal rate and regular  rhythm.     Heart sounds: Normal heart sounds. No  murmur heard.  No friction rub. No gallop.   Pulmonary:     Effort: Pulmonary effort is normal. No respiratory distress.     Breath sounds: Normal breath sounds. No wheezing or rales.  Chest:     Chest wall: No tenderness.  Abdominal:     General: Bowel sounds are normal.     Palpations: Abdomen is soft.     Tenderness: There is no abdominal tenderness.  Musculoskeletal:        General: Normal range of motion.     Cervical back: Normal range of motion and neck supple.  Lymphadenopathy:     Cervical: No cervical adenopathy.  Skin:    General: Skin is warm and dry.  Neurological:     Mental Status: She is alert and oriented to person, place, and time. Mental status is at baseline.     Cranial Nerves: No cranial nerve deficit or facial asymmetry.     Sensory: Sensation is intact.     Motor: Weakness present.     Coordination: Coordination abnormal.     Gait: Gait abnormal.  Psychiatric:        Mood and Affect: Mood normal.        Behavior: Behavior normal.        Thought Content: Thought content normal.        Judgment: Judgment normal.    Assessment/Plan: 1. Acute upper respiratory infection Negative chest x-ray. Improved since last visit. Will continue to monitor.   2. Uncontrolled type 2 diabetes mellitus with hyperglycemia (HCC) Increase novolog 70/30 insulin to 35 units twice daily. Continue other medication as prescribed. monitor closely.  - insulin aspart protamine - aspart (NOVOLOG MIX 70/30 FLEXPEN) (70-30) 100 UNIT/ML FlexPen; Inject 0.35 mLs (35 Units total) into the skin 2 (two) times daily. Inject 35 units [place] QAM. Gradually increase to 40units [place] QPM  Dispense: 15 mL; Refill: 11  3. Weakness Improving with physical therapy. Continue to monitor.   4. Essential hypertension Stable. Continue bp medication as prescribed  General Counseling: haidy kackley understanding of the findings of todays visit and agrees  with plan of treatment. I have discussed any further diagnostic evaluation that may be needed or ordered today. We also reviewed her medications today. she has been encouraged to call the office with any questions or concerns that should arise related to todays visit.   This patient was seen by [facility] with Dr Lavera Guise as a part of collaborative care agreement  Meds ordered this encounter  Medications  . insulin aspart protamine - aspart (NOVOLOG MIX 70/30 FLEXPEN) (70-30) 100 UNIT/ML FlexPen    Sig: Inject 0.35 mLs (35 Units total) into the skin 2 (two) times daily. Inject 35 units [place] QAM. Gradually increase to 40units [place] QPM    Dispense:  15 mL    Refill:  11    Increase dose    Order Specific Question:   Supervising Provider    Answer:   Lavera Guise [[id number]]    Total time spent: 30 Minutes   Time spent includes review of chart, medications, test results, and follow up plan with the patient.      Dr Lavera Guise Internal medicine

## 2019-10-06 DIAGNOSIS — M1990 Unspecified osteoarthritis, unspecified site: Secondary | ICD-10-CM | POA: Diagnosis not present

## 2019-10-06 DIAGNOSIS — J209 Acute bronchitis, unspecified: Secondary | ICD-10-CM | POA: Diagnosis not present

## 2019-10-06 DIAGNOSIS — R42 Dizziness and giddiness: Secondary | ICD-10-CM | POA: Diagnosis not present

## 2019-10-06 DIAGNOSIS — I129 Hypertensive chronic kidney disease with stage 1 through stage 4 chronic kidney disease, or unspecified chronic kidney disease: Secondary | ICD-10-CM | POA: Diagnosis not present

## 2019-10-06 DIAGNOSIS — K760 Fatty (change of) liver, not elsewhere classified: Secondary | ICD-10-CM | POA: Diagnosis not present

## 2019-10-06 DIAGNOSIS — N182 Chronic kidney disease, stage 2 (mild): Secondary | ICD-10-CM | POA: Diagnosis not present

## 2019-10-06 DIAGNOSIS — F419 Anxiety disorder, unspecified: Secondary | ICD-10-CM | POA: Diagnosis not present

## 2019-10-06 DIAGNOSIS — E782 Mixed hyperlipidemia: Secondary | ICD-10-CM | POA: Diagnosis not present

## 2019-10-06 DIAGNOSIS — E1122 Type 2 diabetes mellitus with diabetic chronic kidney disease: Secondary | ICD-10-CM | POA: Diagnosis not present

## 2019-10-14 DIAGNOSIS — I129 Hypertensive chronic kidney disease with stage 1 through stage 4 chronic kidney disease, or unspecified chronic kidney disease: Secondary | ICD-10-CM | POA: Diagnosis not present

## 2019-10-14 DIAGNOSIS — K760 Fatty (change of) liver, not elsewhere classified: Secondary | ICD-10-CM | POA: Diagnosis not present

## 2019-10-14 DIAGNOSIS — E1122 Type 2 diabetes mellitus with diabetic chronic kidney disease: Secondary | ICD-10-CM | POA: Diagnosis not present

## 2019-10-14 DIAGNOSIS — E782 Mixed hyperlipidemia: Secondary | ICD-10-CM | POA: Diagnosis not present

## 2019-10-14 DIAGNOSIS — N182 Chronic kidney disease, stage 2 (mild): Secondary | ICD-10-CM | POA: Diagnosis not present

## 2019-10-14 DIAGNOSIS — M1990 Unspecified osteoarthritis, unspecified site: Secondary | ICD-10-CM | POA: Diagnosis not present

## 2019-10-14 DIAGNOSIS — F419 Anxiety disorder, unspecified: Secondary | ICD-10-CM | POA: Diagnosis not present

## 2019-10-14 DIAGNOSIS — J209 Acute bronchitis, unspecified: Secondary | ICD-10-CM | POA: Diagnosis not present

## 2019-10-14 DIAGNOSIS — R42 Dizziness and giddiness: Secondary | ICD-10-CM | POA: Diagnosis not present

## 2019-10-23 DIAGNOSIS — Z859 Personal history of malignant neoplasm, unspecified: Secondary | ICD-10-CM | POA: Diagnosis not present

## 2019-10-23 DIAGNOSIS — L570 Actinic keratosis: Secondary | ICD-10-CM | POA: Diagnosis not present

## 2019-10-23 DIAGNOSIS — L578 Other skin changes due to chronic exposure to nonionizing radiation: Secondary | ICD-10-CM | POA: Diagnosis not present

## 2019-10-23 DIAGNOSIS — L7211 Pilar cyst: Secondary | ICD-10-CM | POA: Diagnosis not present

## 2019-10-23 DIAGNOSIS — Z872 Personal history of diseases of the skin and subcutaneous tissue: Secondary | ICD-10-CM | POA: Diagnosis not present

## 2019-10-24 DIAGNOSIS — R42 Dizziness and giddiness: Secondary | ICD-10-CM | POA: Diagnosis not present

## 2019-10-24 DIAGNOSIS — I129 Hypertensive chronic kidney disease with stage 1 through stage 4 chronic kidney disease, or unspecified chronic kidney disease: Secondary | ICD-10-CM | POA: Diagnosis not present

## 2019-10-24 DIAGNOSIS — J209 Acute bronchitis, unspecified: Secondary | ICD-10-CM | POA: Diagnosis not present

## 2019-10-24 DIAGNOSIS — N182 Chronic kidney disease, stage 2 (mild): Secondary | ICD-10-CM | POA: Diagnosis not present

## 2019-10-24 DIAGNOSIS — E1122 Type 2 diabetes mellitus with diabetic chronic kidney disease: Secondary | ICD-10-CM | POA: Diagnosis not present

## 2019-10-24 DIAGNOSIS — E782 Mixed hyperlipidemia: Secondary | ICD-10-CM | POA: Diagnosis not present

## 2019-10-24 DIAGNOSIS — F419 Anxiety disorder, unspecified: Secondary | ICD-10-CM | POA: Diagnosis not present

## 2019-10-24 DIAGNOSIS — M1990 Unspecified osteoarthritis, unspecified site: Secondary | ICD-10-CM | POA: Diagnosis not present

## 2019-10-24 DIAGNOSIS — K760 Fatty (change of) liver, not elsewhere classified: Secondary | ICD-10-CM | POA: Diagnosis not present

## 2019-10-27 DIAGNOSIS — F419 Anxiety disorder, unspecified: Secondary | ICD-10-CM | POA: Diagnosis not present

## 2019-10-27 DIAGNOSIS — N182 Chronic kidney disease, stage 2 (mild): Secondary | ICD-10-CM | POA: Diagnosis not present

## 2019-10-27 DIAGNOSIS — E782 Mixed hyperlipidemia: Secondary | ICD-10-CM | POA: Diagnosis not present

## 2019-10-27 DIAGNOSIS — K760 Fatty (change of) liver, not elsewhere classified: Secondary | ICD-10-CM | POA: Diagnosis not present

## 2019-10-27 DIAGNOSIS — J209 Acute bronchitis, unspecified: Secondary | ICD-10-CM | POA: Diagnosis not present

## 2019-10-27 DIAGNOSIS — E1122 Type 2 diabetes mellitus with diabetic chronic kidney disease: Secondary | ICD-10-CM | POA: Diagnosis not present

## 2019-10-27 DIAGNOSIS — M1990 Unspecified osteoarthritis, unspecified site: Secondary | ICD-10-CM | POA: Diagnosis not present

## 2019-10-27 DIAGNOSIS — R42 Dizziness and giddiness: Secondary | ICD-10-CM | POA: Diagnosis not present

## 2019-10-27 DIAGNOSIS — I129 Hypertensive chronic kidney disease with stage 1 through stage 4 chronic kidney disease, or unspecified chronic kidney disease: Secondary | ICD-10-CM | POA: Diagnosis not present

## 2019-10-28 DIAGNOSIS — K760 Fatty (change of) liver, not elsewhere classified: Secondary | ICD-10-CM | POA: Diagnosis not present

## 2019-10-28 DIAGNOSIS — M1990 Unspecified osteoarthritis, unspecified site: Secondary | ICD-10-CM | POA: Diagnosis not present

## 2019-10-28 DIAGNOSIS — R42 Dizziness and giddiness: Secondary | ICD-10-CM | POA: Diagnosis not present

## 2019-10-28 DIAGNOSIS — E782 Mixed hyperlipidemia: Secondary | ICD-10-CM | POA: Diagnosis not present

## 2019-10-28 DIAGNOSIS — F419 Anxiety disorder, unspecified: Secondary | ICD-10-CM | POA: Diagnosis not present

## 2019-10-28 DIAGNOSIS — I129 Hypertensive chronic kidney disease with stage 1 through stage 4 chronic kidney disease, or unspecified chronic kidney disease: Secondary | ICD-10-CM | POA: Diagnosis not present

## 2019-10-28 DIAGNOSIS — E1122 Type 2 diabetes mellitus with diabetic chronic kidney disease: Secondary | ICD-10-CM | POA: Diagnosis not present

## 2019-10-28 DIAGNOSIS — N182 Chronic kidney disease, stage 2 (mild): Secondary | ICD-10-CM | POA: Diagnosis not present

## 2019-10-28 DIAGNOSIS — J209 Acute bronchitis, unspecified: Secondary | ICD-10-CM | POA: Diagnosis not present

## 2019-10-30 DIAGNOSIS — E1122 Type 2 diabetes mellitus with diabetic chronic kidney disease: Secondary | ICD-10-CM | POA: Diagnosis not present

## 2019-10-30 DIAGNOSIS — R42 Dizziness and giddiness: Secondary | ICD-10-CM | POA: Diagnosis not present

## 2019-10-30 DIAGNOSIS — M1990 Unspecified osteoarthritis, unspecified site: Secondary | ICD-10-CM | POA: Diagnosis not present

## 2019-10-30 DIAGNOSIS — E782 Mixed hyperlipidemia: Secondary | ICD-10-CM | POA: Diagnosis not present

## 2019-10-30 DIAGNOSIS — N182 Chronic kidney disease, stage 2 (mild): Secondary | ICD-10-CM | POA: Diagnosis not present

## 2019-10-30 DIAGNOSIS — K760 Fatty (change of) liver, not elsewhere classified: Secondary | ICD-10-CM | POA: Diagnosis not present

## 2019-10-30 DIAGNOSIS — F419 Anxiety disorder, unspecified: Secondary | ICD-10-CM | POA: Diagnosis not present

## 2019-10-30 DIAGNOSIS — I129 Hypertensive chronic kidney disease with stage 1 through stage 4 chronic kidney disease, or unspecified chronic kidney disease: Secondary | ICD-10-CM | POA: Diagnosis not present

## 2019-10-30 DIAGNOSIS — J209 Acute bronchitis, unspecified: Secondary | ICD-10-CM | POA: Diagnosis not present

## 2019-11-20 ENCOUNTER — Other Ambulatory Visit: Payer: Self-pay

## 2019-11-20 MED ORDER — LOVASTATIN 20 MG PO TABS: ORAL_TABLET | ORAL | 1 refills | Status: DC

## 2019-11-21 ENCOUNTER — Other Ambulatory Visit: Payer: Self-pay

## 2019-11-21 MED ORDER — CITALOPRAM HYDROBROMIDE 20 MG PO TABS: ORAL_TABLET | ORAL | 1 refills | Status: DC

## 2019-11-28 ENCOUNTER — Telehealth: Payer: Self-pay

## 2019-11-28 NOTE — Telephone Encounter (Signed)
Confirmed and screened for 12-02-19 ov. 

## 2019-12-02 ENCOUNTER — Encounter: Payer: Self-pay | Admitting: Nurse Practitioner

## 2019-12-02 ENCOUNTER — Ambulatory Visit (INDEPENDENT_AMBULATORY_CARE_PROVIDER_SITE_OTHER): Payer: Medicare HMO | Admitting: Nurse Practitioner

## 2019-12-02 ENCOUNTER — Other Ambulatory Visit: Payer: Self-pay

## 2019-12-02 VITALS — BP 133/75 | HR 86 | Temp 97.9°F | Resp 16 | Ht 60.0 in | Wt 155.6 lb

## 2019-12-02 DIAGNOSIS — I10 Essential (primary) hypertension: Secondary | ICD-10-CM

## 2019-12-02 DIAGNOSIS — E119 Type 2 diabetes mellitus without complications: Secondary | ICD-10-CM

## 2019-12-02 DIAGNOSIS — J069 Acute upper respiratory infection, unspecified: Secondary | ICD-10-CM | POA: Diagnosis not present

## 2019-12-02 DIAGNOSIS — Z794 Long term (current) use of insulin: Secondary | ICD-10-CM

## 2019-12-02 DIAGNOSIS — J3089 Other allergic rhinitis: Secondary | ICD-10-CM

## 2019-12-02 DIAGNOSIS — R05 Cough: Secondary | ICD-10-CM | POA: Diagnosis not present

## 2019-12-02 DIAGNOSIS — R058 Other specified cough: Secondary | ICD-10-CM

## 2019-12-02 LAB — POCT GLYCOSYLATED HEMOGLOBIN (HGB A1C): Hemoglobin A1C: 6.3 % — AB (ref 4.0–5.6)

## 2019-12-02 MED ORDER — LEVOFLOXACIN 500 MG PO TABS: 500.0000 mg | ORAL_TABLET | Freq: Every day | ORAL | 0 refills | Status: DC

## 2019-12-02 MED ORDER — HYDROCOD POLST-CPM POLST ER 10-8 MG/5ML PO SUER: 5.0000 mL | Freq: Two times a day (BID) | ORAL | 0 refills | Status: DC | PRN

## 2019-12-02 NOTE — Progress Notes (Signed)
[facility] [place], [place] [address]  Internal MEDICINE  Office Visit Note  Patient Name: Tiffany Hawkins  [id number]  [id number]  Date of Service: 12/17/2019  Chief Complaint  Patient presents with   Follow-up    bad cough off and on for almost a year   Hypertension   Hyperlipidemia   Depression   Sleep Apnea   Quality Metric Gaps    HepC, ophthalomology exam    The patient is here for routine follow up. Today, she is complaining of nasal congestion, fatigue, and deep, congested cough. These symptoms restarted about two weeks ago. She had very similar symptoms back in May. Had to be on several rounds of antibiotics before symptoms resolved. She did have chest x-ray which was negative for pneumonia or active cardiopulmonary disease. She also had negative COVID 19. This episode, she states that she is running a low grade fever off and on for past few weeks. She has not been tested recently for COVID 19. She has had both COVID 19 vaccines.  Blood sugars and blood pressures are both doing well. Her HgbA1c is 6.3 today. This is down from 7.0 at her most recent check.       Current Medication: Outpatient Encounter Medications as of 12/02/2019  Medication Sig   amLODipine (NORVASC) 5 MG tablet TAKE 2 TABLETS EVERY DAY   Blood Glucose Calibration (ACCU-CHEK SMARTVIEW CONTROL VI) by In Vitro route. Use as di   cetirizine (ZYRTEC) 10 MG chewable tablet Chew 1 tablet (10 mg total) by mouth daily.   citalopram (CELEXA) 20 MG tablet TAKE 2 TABLETS DAILY FOR DEPRESSION.   fenofibrate 54 MG tablet Take 1 tablet (54 mg total) by mouth daily.   glucose blood (ACCU-CHEK SMARTVIEW) test strip Blood sugar checks TID DX E11.65   insulin aspart protamine - aspart (NOVOLOG MIX 70/30 FLEXPEN) (70-30) 100 UNIT/ML FlexPen Inject 0.35 mLs (35 Units total) into the skin 2 (two) times daily. Inject 35 units [place] QAM. Gradually increase to 40units [place] QPM   lovastatin  (MEVACOR) 20 MG tablet TAKE 1 TABLET AT BEDTIME FOR HIGH CHOLESTEROL   metaxalone (SKELAXIN) 800 MG tablet Take 1 tablet (800 mg total) by mouth 2 (two) times daily as needed for muscle spasms.   Multiple Vitamin (MULTIVITAMIN) tablet Take by mouth daily. TAKES 1/2 TABLET   naproxen (NAPROSYN) 500 MG tablet Take 1 tablet (500 mg total) by mouth 2 (two) times daily as needed for moderate pain.   ondansetron (ZOFRAN ODT) 8 MG disintegrating tablet Take 1 tablet (8 mg total) by mouth 2 (two) times daily.   ondansetron (ZOFRAN) 4 MG tablet Take 1 tablet (4 mg total) by mouth every 8 (eight) hours as needed for nausea or vomiting.   Phenylephrine-APAP-guaiFENesin ([place] FAST-MAX) 10-650-400 MG/20ML LIQD Take by mouth.   sharps container 1 each by Does not apply route as needed. To use for insulin syringes and needles. Taking insulin at least twice daily.  E11.65   [DISCONTINUED] fluticasone (FLONASE) 50 MCG/ACT nasal spray Place 2 sprays into both nostrils daily.   [DISCONTINUED] ibuprofen (ADVIL) 800 MG tablet Take 1 tablet (800 mg total) by mouth every 8 (eight) hours as needed.   [DISCONTINUED] lisinopril (ZESTRIL) 5 MG tablet Take 1 tablet (5 mg total) by mouth daily.   [DISCONTINUED] pantoprazole (PROTONIX) 20 MG tablet Take 2 tablets (40 mg total) by mouth daily.   chlorpheniramine-HYDROcodone (TUSSIONEX PENNKINETIC ER) 10-8 MG/5ML SUER Take 5 mLs by mouth every 12 (  twelve) hours as needed for cough.   levofloxacin (LEVAQUIN) 500 MG tablet Take 1 tablet (500 mg total) by mouth daily.   No facility-administered encounter medications on file as of 12/02/2019.    Surgical History: Past Surgical History:  Procedure Laterality Date   APPENDECTOMY     BACK SURGERY     CATARACT EXTRACTION W/PHACO Left 12/03/2017   Procedure: CATARACT EXTRACTION PHACO AND INTRAOCULAR LENS PLACEMENT ([place]) LEFT  DIABETIC;  Surgeon: Eulogio Bear, MD;  Location: [place];  Service:  Ophthalmology;  Laterality: Left;  Diabetic - insulin   CATARACT EXTRACTION W/PHACO Right 01/14/2018   Procedure: CATARACT EXTRACTION PHACO AND INTRAOCULAR LENS PLACEMENT ([facility]) RIGHT DIABETIC;  Surgeon: Eulogio Bear, MD;  Location: [place];  Service: Ophthalmology;  Laterality: Right;  Diabetic - insulin   CERVICAL DISC SURGERY     CHOLECYSTECTOMY     COLON SURGERY     COLONOSCOPY     COLONOSCOPY WITH PROPOFOL N/A 05/17/2015   Procedure: COLONOSCOPY WITH PROPOFOL;  Surgeon: Manya Silvas, MD;  Location: [facility] ENDOSCOPY;  Service: Endoscopy;  Laterality: N/A;   COLONOSCOPY WITH PROPOFOL N/A 04/01/2018   Procedure: COLONOSCOPY WITH BIOPSY;  Surgeon: [name], MD;  Location: [place];  Service: Endoscopy;  Laterality: N/A;  Diabetic - insulin   ESOPHAGOGASTRODUODENOSCOPY     HERNIA REPAIR     POLYPECTOMY N/A 04/01/2018   Procedure: POLYPECTOMY;  Surgeon: [name], MD;  Location: [place];  Service: Endoscopy;  Laterality: N/A;   PORT A CATH INJECTION ([place] [facility])     Port has been removed   TUBAL LIGATION      Medical History: Past Medical History:  Diagnosis Date   Anxiety    Colon cancer ([place]) 10/12/2014   Stage IIB; had chemo   Diabetes mellitus without complication (HCC)    Hyperlipidemia    Hypertension    Lateral epicondylitis    Personal history of chemotherapy    Polio    childhood   Skin cancer    Sqamous Cell, Lt Calf   Sleep apnea    no CPAP, sx improved with wt loss    Family History: Family History  Problem Relation Age of Onset   Alzheimer's disease Mother    Heart disease Father    Colon cancer Cousin    Colon cancer Cousin    Cancer Cousin        Brain Tumor   Breast cancer Neg Hx     Social History   Socioeconomic History   Marital status: Married    Spouse name: Not on file   Number of children: Not on file   Years of education: Not on file   Highest education level:  Not on file  Occupational History   Not on file  Tobacco Use   Smoking status: Former Smoker    Packs/day: 1.50    Years: 20.00    Pack years: 30.00    Types: Cigarettes    Quit date: 04/17/1981    Years since quitting: 38.6   Smokeless tobacco: Never Used  Vaping Use   Vaping Use: Never used  Substance and Sexual Activity   Alcohol use: No   Drug use: No   Sexual activity: Never  Other Topics Concern   Not on file  Social History Narrative   Not on file   Social Determinants of Health   Financial Resource Strain:    Difficulty of Paying Living Expenses: Not on file  Food Insecurity:    Worried About Charity fundraiser in the Last Year: Not on file   [employer] of Food in the Last Year: Not on file  Transportation Needs:    Lack of Transportation (Medical): Not on file   Lack of Transportation (Non-Medical): Not on file  Physical Activity:    Days of Exercise per Week: Not on file   Minutes of Exercise per Session: Not on file  Stress:    Feeling of Stress : Not on file  Social Connections:    Frequency of Communication with Friends and Family: Not on file   Frequency of Social Gatherings with Friends and Family: Not on file   Attends Religious Services: Not on file   Active Member of Clubs or Organizations: Not on file   Attends Archivist Meetings: Not on file   Marital Status: Not on file  Intimate Partner Violence:    Fear of Current or Ex-Partner: Not on file   Emotionally Abused: Not on file   Physically Abused: Not on file   Sexually Abused: Not on file      Review of Systems  Constitutional: Positive for activity change and fatigue. Negative for chills, fever and unexpected weight change.  HENT: Positive for congestion, ear pain, postnasal drip, rhinorrhea, sinus pressure and sinus pain. Negative for sneezing and sore throat.   Respiratory: Positive for cough. Negative for chest tightness, shortness of breath and  wheezing.   Cardiovascular: Negative for chest pain and palpitations.  Gastrointestinal: Negative for abdominal pain, constipation, diarrhea, nausea and vomiting.  Endocrine: Negative for cold intolerance, heat intolerance, polydipsia and polyuria.       Blood sugars have improved since her most recent HgbA1c check.   Musculoskeletal: Positive for arthralgias and myalgias. Negative for back pain, joint swelling and neck pain.  Skin: Negative for rash.  Allergic/Immunologic: Positive for environmental allergies.  Neurological: Positive for dizziness, weakness and headaches. Negative for tremors and numbness.  Hematological: Positive for adenopathy. Does not bruise/bleed easily.  Psychiatric/Behavioral: Negative for behavioral problems (Depression), sleep disturbance and suicidal ideas. The patient is nervous/anxious.     Today's Vitals   12/02/19 1519  BP: 133/75  Pulse: 86  Resp: 16  Temp: 97.9 F (36.6 C)  SpO2: 97%  Weight: 155 lb 9.6 oz (70.6 kg)  Height: 5' (1.524 m)   Body mass index is 30.39 kg/m.  Physical Exam Vitals and nursing note reviewed.  Constitutional:      General: She is not in acute distress.    Appearance: Normal appearance. She is well-developed. She is ill-appearing. She is not diaphoretic.  HENT:     Head: Normocephalic and atraumatic.     Right Ear: Tympanic membrane normal.     Left Ear: Tympanic membrane normal.     Nose: Congestion present.     Right Turbinates: Swollen.     Right Sinus: Maxillary sinus tenderness and frontal sinus tenderness present.     Left Sinus: Maxillary sinus tenderness and frontal sinus tenderness present.     Mouth/Throat:     Pharynx: No oropharyngeal exudate.  Eyes:     Pupils: Pupils are equal, round, and reactive to light.  Neck:     Thyroid: No thyromegaly.     Vascular: No carotid bruit or JVD.     Trachea: No tracheal deviation.  Cardiovascular:     Rate and Rhythm: Normal rate and regular rhythm.     Heart  sounds: Normal heart sounds.  No murmur heard.  No friction rub. No gallop.   Pulmonary:     Effort: Pulmonary effort is normal. No respiratory distress.     Breath sounds: Normal breath sounds. No wheezing or rales.     Comments: Congested, nonproductive cough noted.  Chest:     Chest wall: No tenderness.  Abdominal:     Palpations: Abdomen is soft.  Musculoskeletal:        General: Normal range of motion.     Cervical back: Normal range of motion and neck supple.  Lymphadenopathy:     Cervical: Cervical adenopathy present.  Skin:    General: Skin is warm and dry.  Neurological:     Mental Status: She is alert and oriented to person, place, and time.     Cranial Nerves: No cranial nerve deficit or facial asymmetry.     Sensory: Sensation is intact.     Motor: Weakness present.     Coordination: Coordination normal.     Gait: Gait normal.     Comments: Overall strength has improved over the past several weeks.   Psychiatric:        Mood and Affect: Mood normal.        Behavior: Behavior normal.        Thought Content: Thought content normal.        Judgment: Judgment normal.    Assessment/Plan: 1. Type 2 diabetes mellitus without complication, with long-term current use of insulin (HCC) - POCT HgB A1C 6.3 today. Much improved. No changes made to diabetic medications today. She should continue to monitor closely.   2. Acute upper respiratory infection Start levofloxacin 500mg  daily for 10 days. Rest and increase fluids. Take OTC medications as needed and as indicated to improve acute symptoms.  - levofloxacin (LEVAQUIN) 500 MG tablet; Take 1 tablet (500 mg total) by mouth daily.  Dispense: 10 tablet; Refill: 0  3. Cough productive of yellow sputum Take tussionex up to twice daily as needed for cough. Advised patient not to overuse this medicine and not to mix with other medications or alcohol as it can cause respiratory distress, sleepiness or dizziness. Should also avoid  driving. Patient voiced understanding and agreement.  - chlorpheniramine-HYDROcodone (TUSSIONEX PENNKINETIC ER) 10-8 MG/5ML SUER; Take 5 mLs by mouth every 12 (twelve) hours as needed for cough.  Dispense: 115 mL; Refill: 0  4. Perennial allergic rhinitis Question environmental allergies contributing to multiple respiratory infections. Refer to pulmonology for further evaluation and allergy testing.  - Ambulatory referral to Pulmonology  5. Essential hypertension Stable. Continue bp medication as prescribed   General Counseling: rawan riendeau understanding of the findings of todays visit and agrees with plan of treatment. I have discussed any further diagnostic evaluation that may be needed or ordered today. We also reviewed her medications today. she has been encouraged to call the office with any questions or concerns that should arise related to todays visit.  This patient was seen by [place] with Dr Lavera Guise as a part of collaborative care agreement  Orders Placed This Encounter  Procedures   Ambulatory referral to Pulmonology   POCT HgB A1C    Meds ordered this encounter  Medications   chlorpheniramine-HYDROcodone (TUSSIONEX PENNKINETIC ER) 10-8 MG/5ML SUER    Sig: Take 5 mLs by mouth every 12 (twelve) hours as needed for cough.    Dispense:  115 mL    Refill:  0    Order Specific Question:   Supervising  Provider    Answer:   Lavera Guise [[id number]]   levofloxacin (LEVAQUIN) 500 MG tablet    Sig: Take 1 tablet (500 mg total) by mouth daily.    Dispense:  10 tablet    Refill:  0    Order Specific Question:   Supervising Provider    Answer:   Lavera Guise [[id number]]    Total time spent: 35 Minutes Time spent includes review of chart, medications, test results, and follow up plan with the patient.      Dr Lavera Guise Internal medicine

## 2019-12-04 ENCOUNTER — Other Ambulatory Visit: Payer: Self-pay

## 2019-12-04 DIAGNOSIS — K219 Gastro-esophageal reflux disease without esophagitis: Secondary | ICD-10-CM

## 2019-12-04 IMAGING — MG DIGITAL SCREENING BILAT W/ TOMO W/ CAD
8 series · 8 of 24 positions shown · non-contrast
Comparison: Previous exam(s).

CLINICAL DATA: Screening.

EXAM:
DIGITAL SCREENING BILATERAL MAMMOGRAM WITH TOMO AND CAD

[L CC synth-2D]
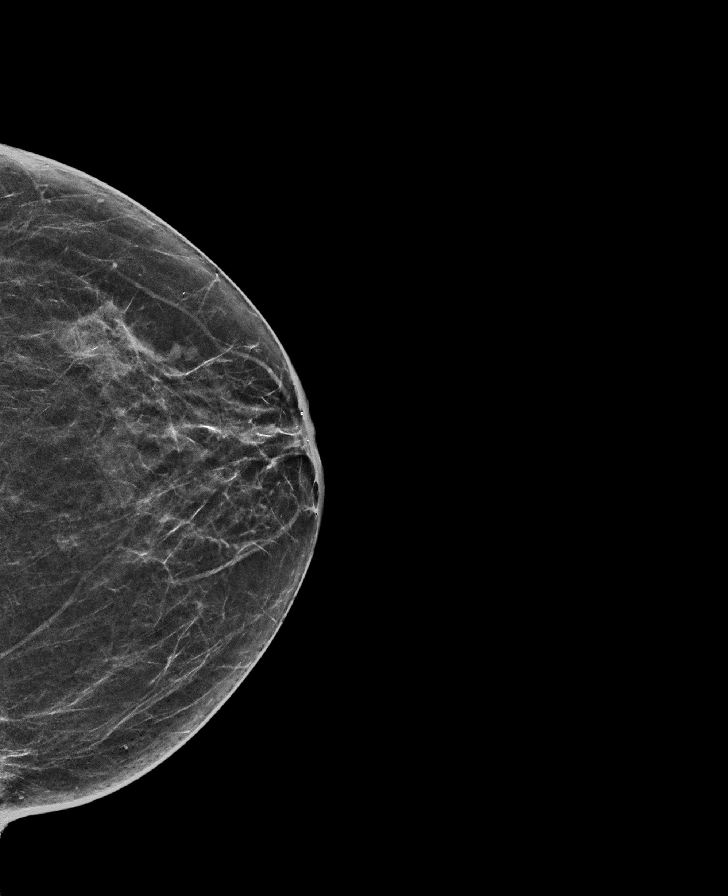

[R MLO synth-2D]
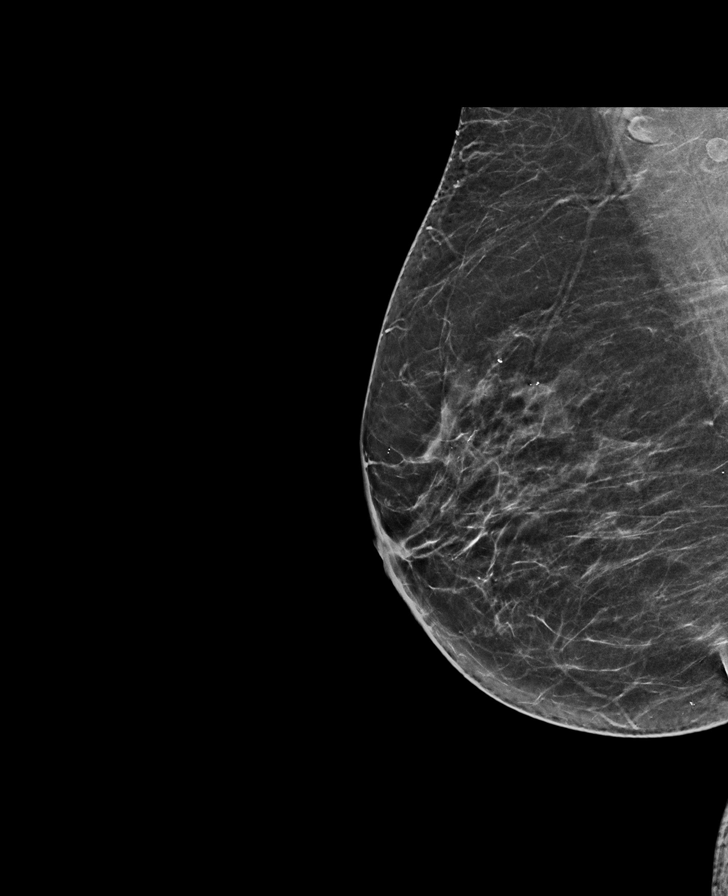

[R CC synth-2D]
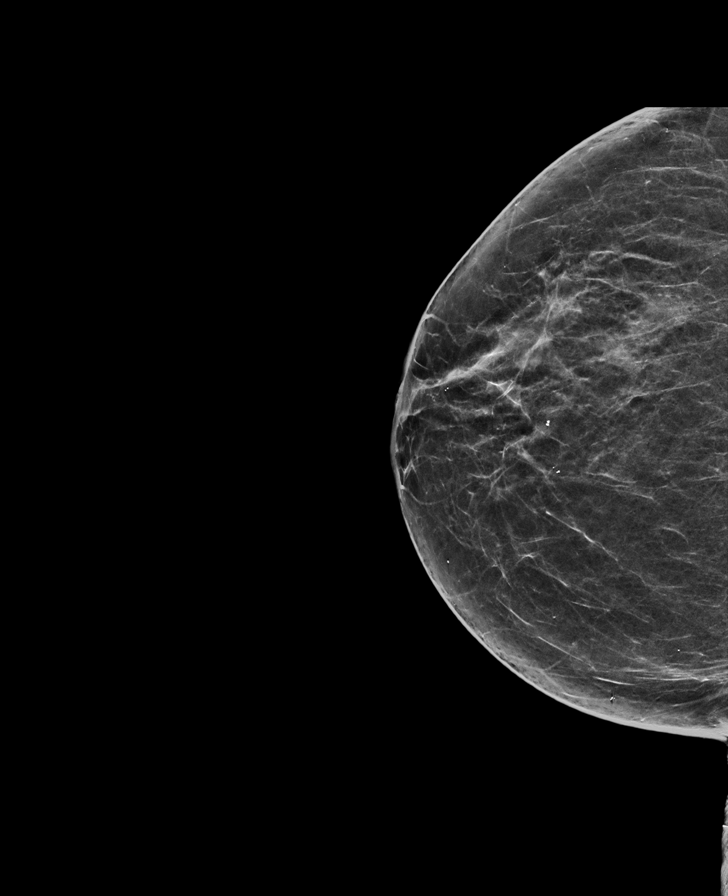

[L MLO synth-2D]
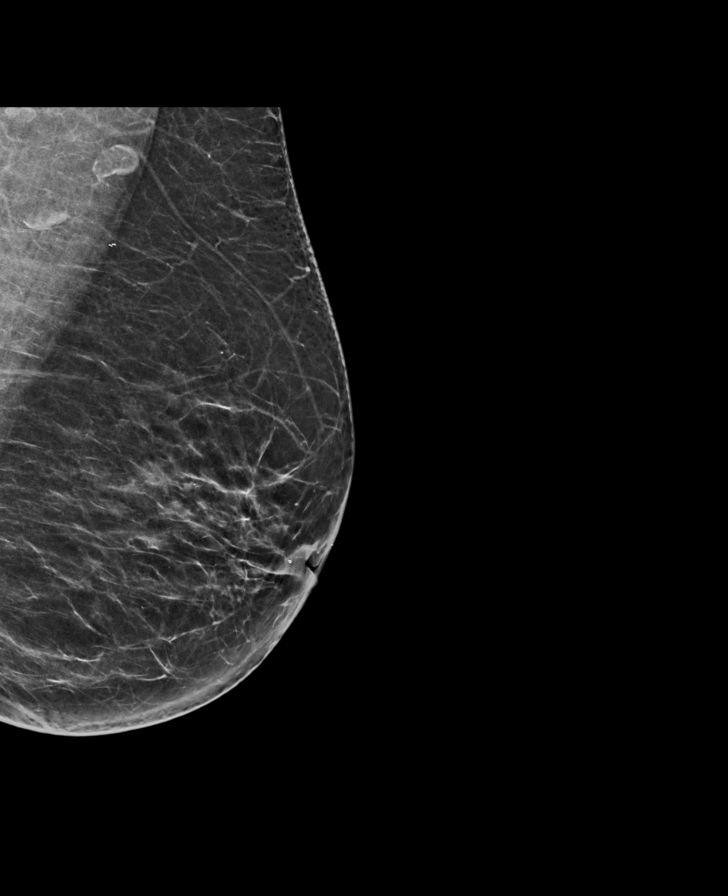

[L CC tomo · tomo slice 29/58.0]
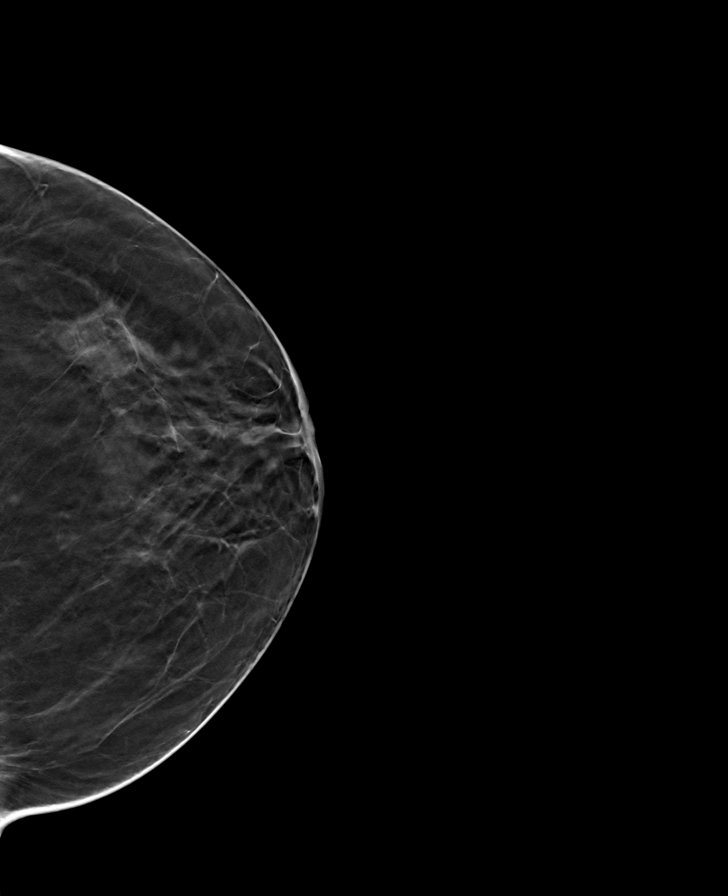

[R MLO tomo · tomo slice 33/66.0]
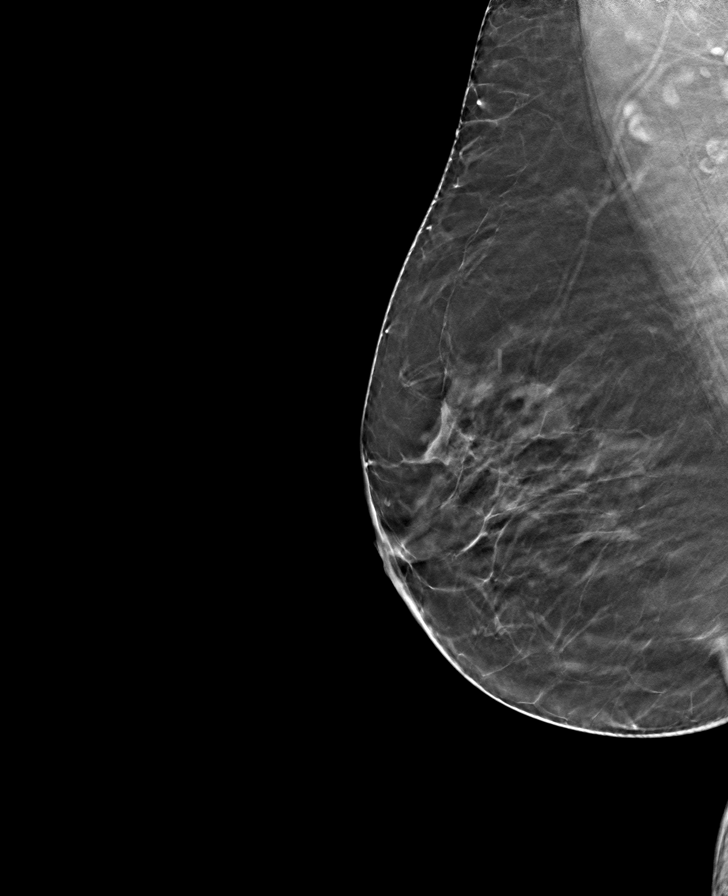

[L MLO tomo · tomo slice 32/63.0]
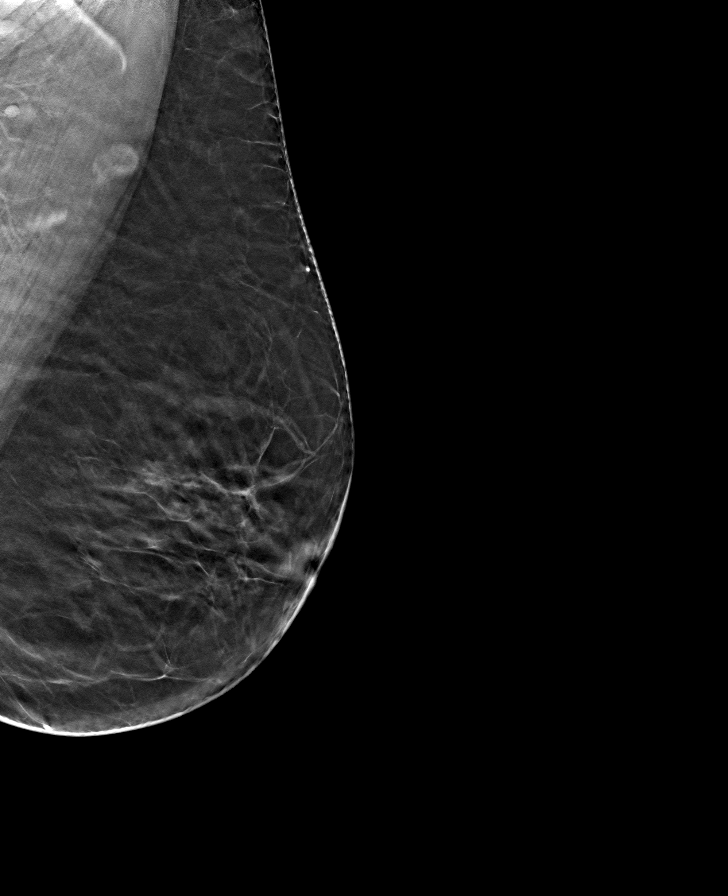

[R CC tomo · tomo slice 32/63.0]
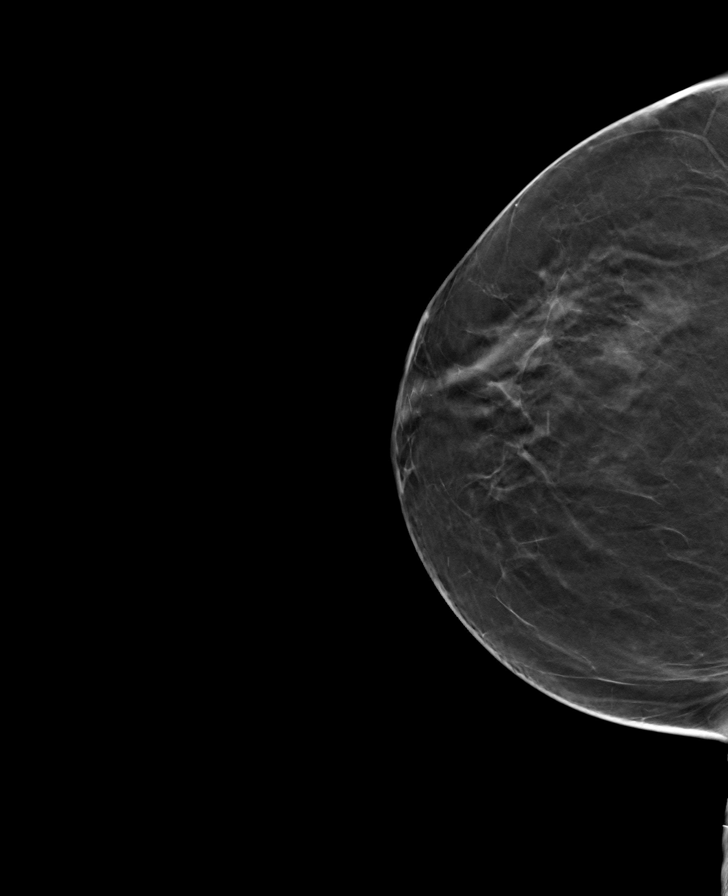

[8 of 24 positions shown; findings below may reference images not displayed]

ACR Breast Density Category b: There are scattered areas of
fibroglandular density.
FINDINGS: There are no findings suspicious for malignancy. Images were
processed with CAD.
IMPRESSION: No mammographic evidence of malignancy. A result letter of this
screening mammogram will be mailed directly to the patient.

RECOMMENDATION:
Screening mammogram in one year. (Code:[id number])

BI-RADS CATEGORY  1: Negative.

## 2019-12-04 MED ORDER — PANTOPRAZOLE SODIUM 20 MG PO TBEC: 40.0000 mg | DELAYED_RELEASE_TABLET | Freq: Every day | ORAL | 1 refills | Status: DC

## 2019-12-16 ENCOUNTER — Other Ambulatory Visit: Payer: Self-pay

## 2019-12-16 DIAGNOSIS — J300 Vasomotor rhinitis: Secondary | ICD-10-CM

## 2019-12-16 DIAGNOSIS — R519 Headache, unspecified: Secondary | ICD-10-CM

## 2019-12-16 MED ORDER — IBUPROFEN 800 MG PO TABS: 800.0000 mg | ORAL_TABLET | Freq: Three times a day (TID) | ORAL | 1 refills | Status: DC | PRN

## 2019-12-16 MED ORDER — FLUTICASONE PROPIONATE 50 MCG/ACT NA SUSP: 2.0000 | Freq: Every day | NASAL | 1 refills | Status: DC

## 2019-12-17 ENCOUNTER — Other Ambulatory Visit: Payer: Self-pay

## 2019-12-17 DIAGNOSIS — I10 Essential (primary) hypertension: Secondary | ICD-10-CM

## 2019-12-17 MED ORDER — LISINOPRIL 5 MG PO TABS: 5.0000 mg | ORAL_TABLET | Freq: Every day | ORAL | 1 refills | Status: DC

## 2019-12-26 ENCOUNTER — Other Ambulatory Visit: Payer: Self-pay

## 2019-12-26 ENCOUNTER — Telehealth: Payer: Self-pay

## 2019-12-26 DIAGNOSIS — E782 Mixed hyperlipidemia: Secondary | ICD-10-CM

## 2019-12-26 MED ORDER — FENOFIBRATE 54 MG PO TABS: 54.0000 mg | ORAL_TABLET | Freq: Every day | ORAL | 1 refills | Status: DC

## 2019-12-26 NOTE — Telephone Encounter (Signed)
CONFIRMED PT APPT 12/30/19. BR °

## 2019-12-30 ENCOUNTER — Ambulatory Visit: Payer: Medicare HMO | Admitting: Internal Medicine

## 2019-12-30 ENCOUNTER — Encounter: Payer: Self-pay | Admitting: Internal Medicine

## 2019-12-30 ENCOUNTER — Other Ambulatory Visit: Payer: Self-pay

## 2019-12-30 DIAGNOSIS — Z87891 Personal history of nicotine dependence: Secondary | ICD-10-CM

## 2019-12-30 DIAGNOSIS — R05 Cough: Secondary | ICD-10-CM | POA: Diagnosis not present

## 2019-12-30 DIAGNOSIS — E782 Mixed hyperlipidemia: Secondary | ICD-10-CM

## 2019-12-30 DIAGNOSIS — R053 Chronic cough: Secondary | ICD-10-CM

## 2019-12-30 MED ORDER — FENOFIBRATE 54 MG PO TABS: 54.0000 mg | ORAL_TABLET | Freq: Every day | ORAL | 1 refills | Status: DC

## 2019-12-30 NOTE — Progress Notes (Signed)
[facility] [place], [place] [address]  Pulmonary Sleep Medicine   Office Visit Note  Patient Name: Tiffany Hawkins DOB: 1946-02-21 MRN [id number]  Date of Service: 12/31/2019  Complaints/HPI: Patient is here today to establish care as a pulmonary patient Previous smoker, started in her early teen years and quit about 35 years ago, at her most she smoked about 2 packs per dau She is referred by PCP due to presistent cough with congestion, she will be treated with antibiotics and cough suppresant and it will clear up, stays gone about 2-3 weeks and then symptoms come back Had history of OSA and wore CPAP but after weight loss she no longer required CPAP therapy  ROS  General: (-) fever, (-) chills, (-) night sweats, (-) weakness Skin: (-) rashes, (-) itching,. Eyes: (-) visual changes, (-) redness, (-) itching. Nose and Sinuses: (-) nasal stuffiness or itchiness, (-) postnasal drip, (-) nosebleeds, (-) sinus trouble. Mouth and Throat: (-) sore throat, (-) hoarseness. Neck: (-) swollen glands, (-) enlarged thyroid, (-) neck pain. Respiratory: + cough, (-) bloody sputum, - shortness of breath, + wheezing. Cardiovascular: - ankle swelling, (-) chest pain. Lymphatic: (-) lymph node enlargement. Neurologic: (-) numbness, (-) tingling. Psychiatric: (-) anxiety, (-) depression   Current Medication: Outpatient Encounter Medications as of 12/30/2019  Medication Sig  . amLODipine (NORVASC) 5 MG tablet TAKE 2 TABLETS EVERY DAY  . Blood Glucose Calibration (ACCU-CHEK SMARTVIEW CONTROL VI) by In Vitro route. Use as di  . cetirizine (ZYRTEC) 10 MG chewable tablet Chew 1 tablet (10 mg total) by mouth daily.  . chlorpheniramine-HYDROcodone (TUSSIONEX PENNKINETIC ER) 10-8 MG/5ML SUER Take 5 mLs by mouth every 12 (twelve) hours as needed for cough.  . citalopram (CELEXA) 20 MG tablet TAKE 2 TABLETS DAILY FOR DEPRESSION.  . fenofibrate 54 MG tablet Take 1 tablet (54 mg  total) by mouth daily.  . fluticasone (FLONASE) 50 MCG/ACT nasal spray Place 2 sprays into both nostrils daily.  Marland Kitchen glucose blood (ACCU-CHEK SMARTVIEW) test strip Blood sugar checks TID DX E11.65  . ibuprofen (ADVIL) 800 MG tablet Take 1 tablet (800 mg total) by mouth every 8 (eight) hours as needed.  . insulin aspart protamine - aspart (NOVOLOG MIX 70/30 FLEXPEN) (70-30) 100 UNIT/ML FlexPen Inject 0.35 mLs (35 Units total) into the skin 2 (two) times daily. Inject 35 units [place] QAM. Gradually increase to 40units [place] QPM  . levofloxacin (LEVAQUIN) 500 MG tablet Take 1 tablet (500 mg total) by mouth daily.  Marland Kitchen lisinopril (ZESTRIL) 5 MG tablet Take 1 tablet (5 mg total) by mouth daily.  Marland Kitchen lovastatin (MEVACOR) 20 MG tablet TAKE 1 TABLET AT BEDTIME FOR HIGH CHOLESTEROL  . metaxalone (SKELAXIN) 800 MG tablet Take 1 tablet (800 mg total) by mouth 2 (two) times daily as needed for muscle spasms.  . Multiple Vitamin (MULTIVITAMIN) tablet Take by mouth daily. TAKES 1/2 TABLET  . naproxen (NAPROSYN) 500 MG tablet Take 1 tablet (500 mg total) by mouth 2 (two) times daily as needed for moderate pain.  Marland Kitchen ondansetron (ZOFRAN ODT) 8 MG disintegrating tablet Take 1 tablet (8 mg total) by mouth 2 (two) times daily.  . ondansetron (ZOFRAN) 4 MG tablet Take 1 tablet (4 mg total) by mouth every 8 (eight) hours as needed for nausea or vomiting.  . pantoprazole (PROTONIX) 20 MG tablet Take 2 tablets (40 mg total) by mouth daily.  Marland Kitchen Phenylephrine-APAP-guaiFENesin ([place] FAST-MAX) 10-650-400 MG/20ML LIQD Take by mouth.  . sharps container  1 each by Does not apply route as needed. To use for insulin syringes and needles. Taking insulin at least twice daily.  E11.65  . [DISCONTINUED] fenofibrate 54 MG tablet Take 1 tablet (54 mg total) by mouth daily.   No facility-administered encounter medications on file as of 12/30/2019.    Surgical History: Past Surgical History:  Procedure Laterality Date  . APPENDECTOMY    . BACK  SURGERY    . CATARACT EXTRACTION W/PHACO Left 12/03/2017   Procedure: CATARACT EXTRACTION PHACO AND INTRAOCULAR LENS PLACEMENT ([place]) LEFT  DIABETIC;  Surgeon: Eulogio Bear, MD;  Location: ;  Service: Ophthalmology;  Laterality: Left;  Diabetic - insulin  . CATARACT EXTRACTION W/PHACO Right 01/14/2018   Procedure: CATARACT EXTRACTION PHACO AND INTRAOCULAR LENS PLACEMENT ([place]) RIGHT DIABETIC;  Surgeon: Eulogio Bear, MD;  Location: [place];  Service: Ophthalmology;  Laterality: Right;  Diabetic - insulin  . CERVICAL DISC SURGERY    . CHOLECYSTECTOMY    . COLON SURGERY    . COLONOSCOPY    . COLONOSCOPY WITH PROPOFOL N/A 05/17/2015   Procedure: COLONOSCOPY WITH PROPOFOL;  Surgeon: Manya Silvas, MD;  Location: [facility] ENDOSCOPY;  Service: Endoscopy;  Laterality: N/A;  . COLONOSCOPY WITH PROPOFOL N/A 04/01/2018   Procedure: COLONOSCOPY WITH BIOPSY;  Surgeon: [name], MD;  Location: [place];  Service: Endoscopy;  Laterality: N/A;  Diabetic - insulin  . ESOPHAGOGASTRODUODENOSCOPY    . HERNIA REPAIR    . POLYPECTOMY N/A 04/01/2018   Procedure: POLYPECTOMY;  Surgeon: [name], MD;  Location: [place];  Service: Endoscopy;  Laterality: N/A;  . PORT A CATH INJECTION ([place] HX)     Port has been removed  . TUBAL LIGATION      Medical History: Past Medical History:  Diagnosis Date  . Anxiety   . Colon cancer ([place]) 10/12/2014   Stage IIB; had chemo  . Diabetes mellitus without complication ([place])   . Hyperlipidemia   . Hypertension   . Lateral epicondylitis   . Personal history of chemotherapy   . Polio    childhood  . Skin cancer    Sqamous Cell, Lt Calf  . Sleep apnea    no CPAP, sx improved with wt loss    Family History: Family History  Problem Relation Age of Onset  . Alzheimer's disease Mother   . Heart disease Father   . Colon cancer Cousin   . Colon cancer Cousin   . Cancer Cousin        Brain Tumor  .  Breast cancer Neg Hx     Social History: Social History   Socioeconomic History  . Marital status: Married    Spouse name: Not on file  . Number of children: Not on file  . Years of education: Not on file  . Highest education level: Not on file  Occupational History  . Not on file  Tobacco Use  . Smoking status: Former Smoker    Packs/day: 1.50    Years: 20.00    Pack years: 30.00    Types: Cigarettes    Quit date: 04/17/1981    Years since quitting: 38.7  . Smokeless tobacco: Never Used  Vaping Use  . Vaping Use: Never used  Substance and Sexual Activity  . Alcohol use: No  . Drug use: No  . Sexual activity: Never  Other Topics Concern  . Not on file  Social History Narrative  . Not on file  Social Determinants of Health   Financial Resource Strain:   . Difficulty of Paying Living Expenses: Not on file  Food Insecurity:   . Worried About Charity fundraiser in the Last Year: Not on file  . Ran Out of Food in the Last Year: Not on file  Transportation Needs:   . Lack of Transportation (Medical): Not on file  . Lack of Transportation (Non-Medical): Not on file  Physical Activity:   . Days of Exercise per Week: Not on file  . Minutes of Exercise per Session: Not on file  Stress:   . Feeling of Stress : Not on file  Social Connections:   . Frequency of Communication with Friends and Family: Not on file  . Frequency of Social Gatherings with Friends and Family: Not on file  . Attends Religious Services: Not on file  . Active Member of Clubs or Organizations: Not on file  . Attends Archivist Meetings: Not on file  . Marital Status: Not on file  Intimate Partner Violence:   . Fear of Current or Ex-Partner: Not on file  . Emotionally Abused: Not on file  . Physically Abused: Not on file  . Sexually Abused: Not on file    Vital Signs: Blood pressure 124/63, pulse 97, temperature 98.3 F (36.8 C), resp. rate 16, height 5' (1.524 m), weight 156 lb  (70.8 kg), SpO2 94 %.  Examination: General Appearance: The patient is well-developed, well-nourished, and in no distress. Skin: Gross inspection of skin unremarkable. Head: normocephalic, no gross deformities. Eyes: no gross deformities noted. ENT: ears appear grossly normal no exudates. Neck: Supple. No thyromegaly. No LAD. Respiratory: Clear throughout, no wheezing or rhonchi. Cardiovascular: Normal S1 and S2 without murmur or rub. Extremities: No cyanosis. pulses are equal. Neurologic: Alert and oriented. No involuntary movements.  LABS: Recent Results (from the past 2160 hour(s))  POCT HgB A1C     Status: Abnormal   Collection Time: 12/02/19  3:42 PM  Result Value Ref Range   Hemoglobin A1C 6.3 (A) 4.0 - 5.6 %   HbA1c POC (<> result, manual entry)     HbA1c, POC (prediabetic range)     HbA1c, POC (controlled diabetic range)      Radiology: DG Chest 2 View  Result Date: 08/29/2019 CLINICAL DATA:  Productive cough. EXAM: CHEST - 2 VIEW COMPARISON:  July 21, 2016. FINDINGS: The heart size and mediastinal contours are within normal limits. Both lungs are clear. The visualized skeletal structures are unremarkable. IMPRESSION: No active cardiopulmonary disease. Electronically Signed   By: Marijo Conception M.D.   On: 08/29/2019 08:27   Assessment and Plan: Patient Active Problem List   Diagnosis Date Noted  . H/O multiple pulmonary nodules 09/22/2019  . Chronic cough 09/22/2019  . Perennial allergic rhinitis 09/17/2019  . Acute upper respiratory infection 08/28/2019  . Weakness 08/28/2019  . Acute bronchitis 06/01/2019  . Cough productive of yellow sputum 06/01/2019  . 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 Chronic kidney disease, stage II (mild) 03/14/2019  . Gastroesophageal reflux disease without esophagitis 03/14/2019  . Intractable episodic headache 03/14/2019  . Need for prophylactic vaccination with combined diphtheria-tetanus-pertussis (DTaP) vaccine 03/14/2019  . Renal  cyst 11/27/2018  . Abnormality of gait and mobility 11/27/2018  . Hepatic steatosis 10/31/2018  . 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 Elevated ferritin 10/31/2018  . Vertigo 08/13/2018  . Gastroenteritis 05/09/2018  . Diarrhea 05/09/2018  . Nausea 05/09/2018  . Personal history of  colon cancer   . Polyp of sigmoid colon   . Dysuria 02/23/2018  . 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 Encounter for general adult medical examination with abnormal findings 02/10/2018  . Acute non-recurrent pansinusitis 08/24/2017  . Other headache syndrome 08/24/2017  . Uncontrolled type 2 diabetes mellitus with hyperglycemia ([place]) 08/24/2017  . Type 2 diabetes mellitus without complication, with long-term current use of insulin ([place]) 08/22/2017  . Need for vaccination against Streptococcus pneumoniae using pneumococcal conjugate vaccine 13 08/22/2017  . Essential hypertension 05/03/2017  . Otalgia, bilateral 05/02/2017  . Type 2 diabetes mellitus with hyperglycemia ([place]) 05/02/2017  . Mixed hyperlipidemia 05/02/2017  . Right lower quadrant pain 06/29/2016  . 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 History of colon cancer 04/13/2015  . Cancer of ascending colon ([place]) 10/12/2014    1. Chronic cough Will perform allergy testing due to recurrent symptoms of cough, congestion as well as rhinorrhea. Will adjust plan of care based on results of testing. - ALLERGY EVALUATION 2, [facility]  2. History of nicotine dependence Will obtain baseline PFT due to history of cigarette smoking. Up to date on CXR, normal findings. Will adjust plan of care based on findings. Has never required daily inhaler therapy. - Pulmonary function test; Future  3. Mixed hyperlipidemia Requesting refills today, will continue monitoring. - fenofibrate 54 MG tablet; Take 1 tablet (54 mg total) by mouth daily.  Dispense: 90 tablet; Refill: 1  General  Counseling: I have discussed the findings of the evaluation and examination with Ivin Booty.  I have also discussed any further diagnostic evaluation thatmay be needed or ordered today. Lanice verbalizes understanding of the findings of todays visit. We also reviewed her medications today and discussed drug interactions and side effects including but not limited excessive drowsiness and altered mental states. We also discussed that there is always a risk not just to her but also people around her. she has been encouraged to call the office with any questions or concerns that should arise related to todays visit.  Orders Placed This Encounter  Procedures  . ALLERGY EVALUATION 2, [facility]  . Pulmonary function test    Standing Status:   Future    Standing Expiration Date:   12/29/2020    Order Specific Question:   Where should this test be performed?    Answer:   [facility]     Time spent: 56  I have personally obtained a history, examined the patient, evaluated laboratory and imaging results, formulated the assessment and plan and placed orders. This patient was seen by Casey Burkitt AGNP-C in Collaboration with Dr. Devona Konig as a part of collaborative care agreement.    Allyne Gee, MD [facility] Pulmonary and Critical Care Sleep medicine

## 2019-12-31 ENCOUNTER — Encounter: Payer: Self-pay | Admitting: Internal Medicine

## 2019-12-31 NOTE — Patient Instructions (Signed)

## 2020-01-06 ENCOUNTER — Encounter: Payer: Self-pay | Admitting: Hospice and Palliative Medicine

## 2020-01-06 ENCOUNTER — Ambulatory Visit (INDEPENDENT_AMBULATORY_CARE_PROVIDER_SITE_OTHER): Payer: Medicare HMO | Admitting: Hospice and Palliative Medicine

## 2020-01-06 DIAGNOSIS — J329 Chronic sinusitis, unspecified: Secondary | ICD-10-CM

## 2020-01-06 DIAGNOSIS — R05 Cough: Secondary | ICD-10-CM | POA: Diagnosis not present

## 2020-01-06 DIAGNOSIS — Z87891 Personal history of nicotine dependence: Secondary | ICD-10-CM

## 2020-01-06 DIAGNOSIS — R059 Cough, unspecified: Secondary | ICD-10-CM

## 2020-01-06 MED ORDER — AZITHROMYCIN 250 MG PO TABS: ORAL_TABLET | ORAL | 0 refills | Status: DC

## 2020-01-06 MED ORDER — PREDNISONE 5 MG PO TABS: ORAL_TABLET | ORAL | 0 refills | Status: DC

## 2020-01-06 MED ORDER — HYDROCOD POLST-CPM POLST ER 10-8 MG/5ML PO SUER
5.0000 mL | Freq: Every evening | ORAL | 0 refills | Status: DC | PRN
Start: 2020-01-06 — End: 2020-06-05

## 2020-01-06 NOTE — Progress Notes (Signed)
[facility] [place], [place] [address]  Internal MEDICINE  Telephone Visit  Patient Name: Tiffany Hawkins  [id number]  [id number]  Date of Service: 01/07/2020  I connected with the patient at 1649 by telephone and verified the patients identity using two identifiers.   I discussed the limitations, risks, security and privacy concerns of performing an evaluation and management service by telephone and the availability of in person appointments. I also discussed with the patient that there may be a patient responsible charge related to the service.  The patient expressed understanding and agrees to proceed.    Chief Complaint  Patient presents with  . Telephone Screen    dont feel good   . Telephone Assessment  . Diabetes  . Hyperlipidemia  . Hypertension  . Cough    coughing up thick phlem  . Fatigue    dont feel like doing anything, legs feel numb  . Quality Metric Gaps    flu shot    HPI Patient is being seen today for a sick visit She is complaining today of recurring symptoms of congestion, cough, sinus pain and pressure and headache She has been several times in the last few months for these same symptoms, treated with antibiotics which resolves symptoms for a short period of time but symptoms reoccur a few weeks later She is scheduled for PFT and allergy testing in the near future but she feels she cannot wait that long before having relief from her symptoms Her coughing is much worse at night, it prevents her and her husband from sleeping well She is also becoming very frustrated that her symptoms continue to reoccur   Current Medication: Outpatient Encounter Medications as of 01/06/2020  Medication Sig  . amLODipine (NORVASC) 5 MG tablet TAKE 2 TABLETS EVERY DAY  . Blood Glucose Calibration (ACCU-CHEK SMARTVIEW CONTROL VI) by In Vitro route. Use as di  . cetirizine (ZYRTEC) 10 MG chewable tablet Chew 1 tablet (10 mg total) by mouth daily.  .  chlorpheniramine-HYDROcodone (TUSSIONEX PENNKINETIC ER) 10-8 MG/5ML SUER Take 5 mLs by mouth at bedtime as needed for cough.  . citalopram (CELEXA) 20 MG tablet TAKE 2 TABLETS DAILY FOR DEPRESSION.  . fenofibrate 54 MG tablet Take 1 tablet (54 mg total) by mouth daily.  . fluticasone (FLONASE) 50 MCG/ACT nasal spray Place 2 sprays into both nostrils daily.  Marland Kitchen glucose blood (ACCU-CHEK SMARTVIEW) test strip Blood sugar checks TID DX E11.65  . ibuprofen (ADVIL) 800 MG tablet Take 1 tablet (800 mg total) by mouth every 8 (eight) hours as needed.  . insulin aspart protamine - aspart (NOVOLOG MIX 70/30 FLEXPEN) (70-30) 100 UNIT/ML FlexPen Inject 0.35 mLs (35 Units total) into the skin 2 (two) times daily. Inject 35 units [place] QAM. Gradually increase to 40units [place] QPM  . levofloxacin (LEVAQUIN) 500 MG tablet Take 1 tablet (500 mg total) by mouth daily.  Marland Kitchen lisinopril (ZESTRIL) 5 MG tablet Take 1 tablet (5 mg total) by mouth daily.  Marland Kitchen lovastatin (MEVACOR) 20 MG tablet TAKE 1 TABLET AT BEDTIME FOR HIGH CHOLESTEROL  . metaxalone (SKELAXIN) 800 MG tablet Take 1 tablet (800 mg total) by mouth 2 (two) times daily as needed for muscle spasms.  . Multiple Vitamin (MULTIVITAMIN) tablet Take by mouth daily. TAKES 1/2 TABLET  . naproxen (NAPROSYN) 500 MG tablet Take 1 tablet (500 mg total) by mouth 2 (two) times daily as needed for moderate pain.  Marland Kitchen ondansetron (ZOFRAN ODT) 8 MG disintegrating tablet Take  1 tablet (8 mg total) by mouth 2 (two) times daily.  . ondansetron (ZOFRAN) 4 MG tablet Take 1 tablet (4 mg total) by mouth every 8 (eight) hours as needed for nausea or vomiting.  . pantoprazole (PROTONIX) 20 MG tablet Take 2 tablets (40 mg total) by mouth daily.  Marland Kitchen Phenylephrine-APAP-guaiFENesin ([place] FAST-MAX) 10-650-400 MG/20ML LIQD Take by mouth.  . sharps container 1 each by Does not apply route as needed. To use for insulin syringes and needles. Taking insulin at least twice daily.  E11.65  . [DISCONTINUED]  chlorpheniramine-HYDROcodone (TUSSIONEX PENNKINETIC ER) 10-8 MG/5ML SUER Take 5 mLs by mouth every 12 (twelve) hours as needed for cough.  Marland Kitchen azithromycin (ZITHROMAX) 250 MG tablet Take one tablet by mouth daily.  . predniSONE (DELTASONE) 5 MG tablet Take 1 tablet three times a day for 3 days, take 1 tablet twice a day for 3 day, take 1 tablet daily for 5 days.   No facility-administered encounter medications on file as of 01/06/2020.    Surgical History: Past Surgical History:  Procedure Laterality Date  . APPENDECTOMY    . BACK SURGERY    . CATARACT EXTRACTION W/PHACO Left 12/03/2017   Procedure: CATARACT EXTRACTION PHACO AND INTRAOCULAR LENS PLACEMENT ([place]) LEFT  DIABETIC;  Surgeon: Eulogio Bear, MD;  Location: [place];  Service: Ophthalmology;  Laterality: Left;  Diabetic - insulin  . CATARACT EXTRACTION W/PHACO Right 01/14/2018   Procedure: CATARACT EXTRACTION PHACO AND INTRAOCULAR LENS PLACEMENT ([place]) RIGHT DIABETIC;  Surgeon: Eulogio Bear, MD;  Location: [place];  Service: Ophthalmology;  Laterality: Right;  Diabetic - insulin  . CERVICAL DISC SURGERY    . CHOLECYSTECTOMY    . COLON SURGERY    . COLONOSCOPY    . COLONOSCOPY WITH PROPOFOL N/A 05/17/2015   Procedure: COLONOSCOPY WITH PROPOFOL;  Surgeon: Manya Silvas, MD;  Location[facility] ENDOSCOPY;  Service: Endoscopy;  Laterality: N/A;  . COLONOSCOPY WITH PROPOFOL N/A 04/01/2018   Procedure: COLONOSCOPY WITH BIOPSY;  Surgeon: [name], MD;  Location: [facility];  Service: Endoscopy;  Laterality: N/A;  Diabetic - insulin  . ESOPHAGOGASTRODUODENOSCOPY    . HERNIA REPAIR    . POLYPECTOMY N/A 04/01/2018   Procedure: POLYPECTOMY;  Surgeon: [name], MD;  Location: [place];  Service: Endoscopy;  Laterality: N/A;  . PORT A CATH INJECTION ([place] HX)     Port has been removed  . TUBAL LIGATION      Medical History: Past Medical History:  Diagnosis Date  . Anxiety   .  Colon cancer ([place]) 10/12/2014   Stage IIB; had chemo  . Diabetes mellitus without complication ([place])   . Hyperlipidemia   . Hypertension   . Lateral epicondylitis   . Personal history of chemotherapy   . Polio    childhood  . Skin cancer    Sqamous Cell, Lt Calf  . Sleep apnea    no CPAP, sx improved with wt loss    Family History: Family History  Problem Relation Age of Onset  . Alzheimer's disease Mother   . Heart disease Father   . Colon cancer Cousin   . Colon cancer Cousin   . Cancer Cousin        Brain Tumor  . Breast cancer Neg Hx     Social History   Socioeconomic History  . Marital status: Married    Spouse name: Not on file  . Number of children: Not on file  . Years of  education: Not on file  . Highest education level: Not on file  Occupational History  . Not on file  Tobacco Use  . Smoking status: Former Smoker    Packs/day: 1.50    Years: 20.00    Pack years: 30.00    Types: Cigarettes    Quit date: 04/17/1981    Years since quitting: 38.7  . Smokeless tobacco: Never Used  Vaping Use  . Vaping Use: Never used  Substance and Sexual Activity  . Alcohol use: No  . Drug use: No  . Sexual activity: Never  Other Topics Concern  . Not on file  Social History Narrative  . Not on file   Social Determinants of Health   Financial Resource Strain:   . Difficulty of Paying Living Expenses: Not on file  Food Insecurity:   . Worried About Charity fundraiser in the Last Year: Not on file  . Ran Out of Food in the Last Year: Not on file  Transportation Needs:   . Lack of Transportation (Medical): Not on file  . Lack of Transportation (Non-Medical): Not on file  Physical Activity:   . Days of Exercise per Week: Not on file  . Minutes of Exercise per Session: Not on file  Stress:   . Feeling of Stress : Not on file  Social Connections:   . Frequency of Communication with Friends and Family: Not on file  . Frequency of Social Gatherings with Friends  and Family: Not on file  . Attends Religious Services: Not on file  . Active Member of Clubs or Organizations: Not on file  . Attends Archivist Meetings: Not on file  . Marital Status: Not on file  Intimate Partner Violence:   . Fear of Current or Ex-Partner: Not on file  . Emotionally Abused: Not on file  . Physically Abused: Not on file  . Sexually Abused: Not on file   Review of Systems  Constitutional: Positive for fatigue. Negative for chills and diaphoresis.  HENT: Positive for congestion, sinus pressure, sinus pain, sore throat and voice change. Negative for ear pain and postnasal drip.   Eyes: Negative for photophobia, discharge, redness, itching and visual disturbance.  Respiratory: Positive for cough. Negative for shortness of breath and wheezing.   Cardiovascular: Negative for chest pain, palpitations and leg swelling.  Gastrointestinal: Negative for abdominal pain, constipation, diarrhea, nausea and vomiting.  Genitourinary: Negative for dysuria and flank pain.  Musculoskeletal: Negative for arthralgias, back pain, gait problem and neck pain.  Skin: Negative for color change.  Allergic/Immunologic: Negative for environmental allergies and food allergies.  Neurological: Negative for dizziness and headaches.  Hematological: Does not bruise/bleed easily.  Psychiatric/Behavioral: Negative for agitation, behavioral problems (depression) and hallucinations.    Vital Signs: BP 140/68   Pulse 94   Resp 16   Ht 5' (1.524 m)   Wt 156 lb (70.8 kg)   BMI 30.47 kg/m    Observation/Objective: Congestion noted in voice and cough  Assessment/Plan: 1. Chronic sinusitis, unspecified location Due to recurrent sinusitis will treat with 14 day course of azithromycin as well as low dose steroid taper. Awaiting PFT and allergy testing to further evaluate recurring symptoms. - azithromycin (ZITHROMAX) 250 MG tablet; Take one tablet by mouth daily.  Dispense: 14 tablet;  Refill: 0 - predniSONE (DELTASONE) 5 MG tablet; Take 1 tablet three times a day for 3 days, take 1 tablet twice a day for 3 day, take 1 tablet daily for 5 days.  Dispense: 20 tablet; Refill: 0  2. Cough Due to severity of cough at night disrupting her sleep, Tussionex ordered to only be taken as needed at night. Last CXR 08/2019, normal CXR, at that time patient was experiencing the same symptoms. - chlorpheniramine-HYDROcodone (TUSSIONEX PENNKINETIC ER) 10-8 MG/5ML SUER; Take 5 mLs by mouth at bedtime as needed for cough.  Dispense: 115 mL; Refill: 0  3. History of nicotine dependence Will obtain baseline PFT to further evaluate for underlying pulmonary disease contributing to her recurring symptoms. - Pulmonary function test  General Counseling: relda agosto understanding of the findings of today's phone visit and agrees with plan of treatment. I have discussed any further diagnostic evaluation that may be needed or ordered today. We also reviewed her medications today. she has been encouraged to call the office with any questions or concerns that should arise related to todays visit.   Meds ordered this encounter  Medications  . azithromycin (ZITHROMAX) 250 MG tablet    Sig: Take one tablet by mouth daily.    Dispense:  14 tablet    Refill:  0  . predniSONE (DELTASONE) 5 MG tablet    Sig: Take 1 tablet three times a day for 3 days, take 1 tablet twice a day for 3 day, take 1 tablet daily for 5 days.    Dispense:  20 tablet    Refill:  0  . chlorpheniramine-HYDROcodone (TUSSIONEX PENNKINETIC ER) 10-8 MG/5ML SUER    Sig: Take 5 mLs by mouth at bedtime as needed for cough.    Dispense:  115 mL    Refill:  0     Time spent: [place]. Nomie Buchberger AGNP-C Internal medicine

## 2020-01-07 ENCOUNTER — Encounter: Payer: Self-pay | Admitting: Hospice and Palliative Medicine

## 2020-01-12 ENCOUNTER — Telehealth: Payer: Self-pay

## 2020-01-12 NOTE — Telephone Encounter (Signed)
Confirmed and screened for ov on 9/29 

## 2020-01-14 ENCOUNTER — Ambulatory Visit (INDEPENDENT_AMBULATORY_CARE_PROVIDER_SITE_OTHER): Payer: Medicare HMO | Admitting: Internal Medicine

## 2020-01-14 ENCOUNTER — Other Ambulatory Visit: Payer: Self-pay

## 2020-01-14 DIAGNOSIS — R519 Headache, unspecified: Secondary | ICD-10-CM

## 2020-01-14 DIAGNOSIS — R0602 Shortness of breath: Secondary | ICD-10-CM | POA: Diagnosis not present

## 2020-01-14 LAB — PULMONARY FUNCTION TEST

## 2020-01-14 MED ORDER — IBUPROFEN 800 MG PO TABS: 800.0000 mg | ORAL_TABLET | Freq: Three times a day (TID) | ORAL | 1 refills | Status: DC | PRN

## 2020-01-15 ENCOUNTER — Other Ambulatory Visit: Payer: Self-pay

## 2020-01-15 DIAGNOSIS — I10 Essential (primary) hypertension: Secondary | ICD-10-CM

## 2020-01-15 MED ORDER — AMLODIPINE BESYLATE 5 MG PO TABS: 10.0000 mg | ORAL_TABLET | Freq: Every day | ORAL | 1 refills | Status: DC

## 2020-01-21 ENCOUNTER — Encounter: Payer: Self-pay | Admitting: Hospice and Palliative Medicine

## 2020-01-21 ENCOUNTER — Ambulatory Visit (INDEPENDENT_AMBULATORY_CARE_PROVIDER_SITE_OTHER): Payer: Medicare HMO | Admitting: Hospice and Palliative Medicine

## 2020-01-21 DIAGNOSIS — I10 Essential (primary) hypertension: Secondary | ICD-10-CM | POA: Diagnosis not present

## 2020-01-21 DIAGNOSIS — E119 Type 2 diabetes mellitus without complications: Secondary | ICD-10-CM | POA: Diagnosis not present

## 2020-01-21 DIAGNOSIS — R059 Cough, unspecified: Secondary | ICD-10-CM

## 2020-01-21 DIAGNOSIS — R053 Chronic cough: Secondary | ICD-10-CM

## 2020-01-21 DIAGNOSIS — Z794 Long term (current) use of insulin: Secondary | ICD-10-CM | POA: Diagnosis not present

## 2020-01-21 MED ORDER — IPRATROPIUM-ALBUTEROL 0.5-2.5 (3) MG/3ML IN SOLN
3.0000 mL | Freq: Once | RESPIRATORY_TRACT | Status: AC
  Administered 2020-01-21: 3 mL via RESPIRATORY_TRACT

## 2020-01-21 NOTE — Progress Notes (Signed)
[facility] [place], [place] [address]  Internal MEDICINE  Office Visit Note  Patient Name: Tiffany Hawkins  [id number]  [id number]  Date of Service: 01/24/2020  Chief Complaint  Patient presents with  . Follow-up  . Diabetes  . Hyperlipidemia  . Hypertension    HPI Patient is here for routine follow-up DM-She has been on 70/30 insulin for some time, would like to wean herself off insulin, was put on this due to not having insurance at one time, she now has medication coverage She is currently injecting 35 units twice daily, glucose levels have been stable since last visit Continues to have congestion as well as productive coughing--did have PFT, normal, former smoker Has been on multiple antibiotics as well as prednisone, she is feeling better but has a lingering cough and mild congestion Scheduled for allergy testing in a few weeks CXR normal Denies shortness of breath or swelling of lower extremities--unlikely cardiac etiology  [place] Glade and Breztri samples given today   Current Medication: Outpatient Encounter Medications as of 01/21/2020  Medication Sig  . amLODipine (NORVASC) 5 MG tablet Take 2 tablets (10 mg total) by mouth daily.  . Blood Glucose Calibration (ACCU-CHEK SMARTVIEW CONTROL VI) by In Vitro route. Use as di  . cetirizine (ZYRTEC) 10 MG chewable tablet Chew 1 tablet (10 mg total) by mouth daily.  . chlorpheniramine-HYDROcodone (TUSSIONEX PENNKINETIC ER) 10-8 MG/5ML SUER Take 5 mLs by mouth at bedtime as needed for cough.  . citalopram (CELEXA) 20 MG tablet TAKE 2 TABLETS DAILY FOR DEPRESSION.  . fenofibrate 54 MG tablet Take 1 tablet (54 mg total) by mouth daily.  . fluticasone (FLONASE) 50 MCG/ACT nasal spray Place 2 sprays into both nostrils daily.  Marland Kitchen ibuprofen (ADVIL) 800 MG tablet Take 1 tablet (800 mg total) by mouth every 8 (eight) hours as needed.  . insulin aspart protamine - aspart (NOVOLOG MIX 70/30 FLEXPEN) (70-30) 100  UNIT/ML FlexPen Inject 0.35 mLs (35 Units total) into the skin 2 (two) times daily. Inject 35 units [place] QAM. Gradually increase to 40units [place] QPM  . levofloxacin (LEVAQUIN) 500 MG tablet Take 1 tablet (500 mg total) by mouth daily.  Marland Kitchen lisinopril (ZESTRIL) 5 MG tablet Take 1 tablet (5 mg total) by mouth daily.  Marland Kitchen lovastatin (MEVACOR) 20 MG tablet TAKE 1 TABLET AT BEDTIME FOR HIGH CHOLESTEROL  . metaxalone (SKELAXIN) 800 MG tablet Take 1 tablet (800 mg total) by mouth 2 (two) times daily as needed for muscle spasms.  . Multiple Vitamin (MULTIVITAMIN) tablet Take by mouth daily. TAKES 1/2 TABLET  . naproxen (NAPROSYN) 500 MG tablet Take 1 tablet (500 mg total) by mouth 2 (two) times daily as needed for moderate pain.  Marland Kitchen ondansetron (ZOFRAN ODT) 8 MG disintegrating tablet Take 1 tablet (8 mg total) by mouth 2 (two) times daily.  . ondansetron (ZOFRAN) 4 MG tablet Take 1 tablet (4 mg total) by mouth every 8 (eight) hours as needed for nausea or vomiting.  . pantoprazole (PROTONIX) 20 MG tablet Take 2 tablets (40 mg total) by mouth daily.  Marland Kitchen Phenylephrine-APAP-guaiFENesin ([place] FAST-MAX) 10-650-400 MG/20ML LIQD Take by mouth.  . sharps container 1 each by Does not apply route as needed. To use for insulin syringes and needles. Taking insulin at least twice daily.  E11.65  . [DISCONTINUED] glucose blood (ACCU-CHEK SMARTVIEW) test strip Blood sugar checks TID DX E11.65  . [DISCONTINUED] azithromycin (ZITHROMAX) 250 MG tablet Take one tablet by mouth daily. (Patient not taking:  Reported on 01/21/2020)  . [DISCONTINUED] predniSONE (DELTASONE) 5 MG tablet Take 1 tablet three times a day for 3 days, take 1 tablet twice a day for 3 day, take 1 tablet daily for 5 days. (Patient not taking: Reported on 01/21/2020)  . [EXPIRED] ipratropium-albuterol (DUONEB) 0.5-2.5 (3) MG/3ML nebulizer solution 3 mL    No facility-administered encounter medications on file as of 01/21/2020.    Surgical History: Past Surgical  History:  Procedure Laterality Date  . APPENDECTOMY    . BACK SURGERY    . CATARACT EXTRACTION W/PHACO Left 12/03/2017   Procedure: CATARACT EXTRACTION PHACO AND INTRAOCULAR LENS PLACEMENT ([place]) LEFT  DIABETIC;  Surgeon: Eulogio Bear, MD;  Location: [place];  Service: Ophthalmology;  Laterality: Left;  Diabetic - insulin  . CATARACT EXTRACTION W/PHACO Right 01/14/2018   Procedure: CATARACT EXTRACTION PHACO AND INTRAOCULAR LENS PLACEMENT ([place]) RIGHT DIABETIC;  Surgeon: Eulogio Bear, MD;  Location: [facility];  Service: Ophthalmology;  Laterality: Right;  Diabetic - insulin  . CERVICAL DISC SURGERY    . CHOLECYSTECTOMY    . COLON SURGERY    . COLONOSCOPY    . COLONOSCOPY WITH PROPOFOL N/A 05/17/2015   Procedure: COLONOSCOPY WITH PROPOFOL;  Surgeon: Manya Silvas, MD;  Location: [facility] ENDOSCOPY;  Service: Endoscopy;  Laterality: N/A;  . COLONOSCOPY WITH PROPOFOL N/A 04/01/2018   Procedure: COLONOSCOPY WITH BIOPSY;  Surgeon: [name], MD;  Location: [place];  Service: Endoscopy;  Laterality: N/A;  Diabetic - insulin  . ESOPHAGOGASTRODUODENOSCOPY    . HERNIA REPAIR    . POLYPECTOMY N/A 04/01/2018   Procedure: POLYPECTOMY;  Surgeon: [name], MD;  Location: [facility] [place];  Service: Endoscopy;  Laterality: N/A;  . PORT A CATH INJECTION ([place] HX)     Port has been removed  . TUBAL LIGATION      Medical History: Past Medical History:  Diagnosis Date  . Anxiety   . Colon cancer ([place]) 10/12/2014   Stage IIB; had chemo  . Diabetes mellitus without complication ([place])   . Hyperlipidemia   . Hypertension   . Lateral epicondylitis   . Personal history of chemotherapy   . Polio    childhood  . Skin cancer    Sqamous Cell, Lt Calf  . Sleep apnea    no CPAP, sx improved with wt loss    Family History: Family History  Problem Relation Age of Onset  . Alzheimer's disease Mother   . Heart disease Father   . Colon cancer Cousin   .  Colon cancer Cousin   . Cancer Cousin        Brain Tumor  . Breast cancer Neg Hx     Social History   Socioeconomic History  . Marital status: Married    Spouse name: Not on file  . Number of children: Not on file  . Years of education: Not on file  . Highest education level: Not on file  Occupational History  . Not on file  Tobacco Use  . Smoking status: Former Smoker    Packs/day: 1.50    Years: 20.00    Pack years: 30.00    Types: Cigarettes    Quit date: 04/17/1981    Years since quitting: 38.7  . Smokeless tobacco: Never Used  Vaping Use  . Vaping Use: Never used  Substance and Sexual Activity  . Alcohol use: No  . Drug use: No  . Sexual activity: Never  Other Topics Concern  .  Not on file  Social History Narrative  . Not on file   Social Determinants of Health   Financial Resource Strain:   . Difficulty of Paying Living Expenses: Not on file  Food Insecurity:   . Worried About Charity fundraiser in the Last Year: Not on file  . Ran Out of Food in the Last Year: Not on file  Transportation Needs:   . Lack of Transportation (Medical): Not on file  . Lack of Transportation (Non-Medical): Not on file  Physical Activity:   . Days of Exercise per Week: Not on file  . Minutes of Exercise per Session: Not on file  Stress:   . Feeling of Stress : Not on file  Social Connections:   . Frequency of Communication with Friends and Family: Not on file  . Frequency of Social Gatherings with Friends and Family: Not on file  . Attends Religious Services: Not on file  . Active Member of Clubs or Organizations: Not on file  . Attends Archivist Meetings: Not on file  . Marital Status: Not on file  Intimate Partner Violence:   . Fear of Current or Ex-Partner: Not on file  . Emotionally Abused: Not on file  . Physically Abused: Not on file  . Sexually Abused: Not on file      Review of Systems  Constitutional: Negative for chills, diaphoresis and fatigue.   HENT: Positive for congestion. Negative for ear pain, postnasal drip and sinus pressure.   Eyes: Negative for photophobia, discharge, redness, itching and visual disturbance.  Respiratory: Positive for cough. Negative for shortness of breath and wheezing.   Cardiovascular: Negative for chest pain, palpitations and leg swelling.  Gastrointestinal: Negative for abdominal pain, constipation, diarrhea, nausea and vomiting.  Genitourinary: Negative for dysuria and flank pain.  Musculoskeletal: Negative for arthralgias, back pain, gait problem and neck pain.  Skin: Negative for color change.  Allergic/Immunologic: Negative for environmental allergies and food allergies.  Neurological: Negative for dizziness and headaches.  Hematological: Does not bruise/bleed easily.  Psychiatric/Behavioral: Negative for agitation, behavioral problems (depression) and hallucinations.    Vital Signs: BP 134/76   Pulse 90   Temp (!) 97.4 F (36.3 C)   Resp 16   Ht 5\' 2"  (1.575 m)   Wt 156 lb (70.8 kg)   SpO2 98%   BMI 28.53 kg/m    Physical Exam Vitals reviewed.  Constitutional:      Appearance: Normal appearance.  HENT:     Nose: Nose normal.     Mouth/Throat:     Mouth: Mucous membranes are moist.     Pharynx: Oropharynx is clear.  Cardiovascular:     Rate and Rhythm: Normal rate and regular rhythm.     Pulses: Normal pulses.     Heart sounds: Normal heart sounds.  Pulmonary:     Effort: Pulmonary effort is normal.     Breath sounds: Normal breath sounds.  Skin:    General: Skin is warm.  Neurological:     General: No focal deficit present.     Mental Status: She is alert and oriented to person, place, and time. Mental status is at baseline.  Psychiatric:        Mood and Affect: Mood normal.        Behavior: Behavior normal.        Thought Content: Thought content normal.   Assessment/Plan: 1. Chronic cough Samples of Breztri given today to see if symptoms improve with use of  daily  inhaler Awaiting allergy testing Breathing treatment given in office to help with chest congestion--reports no significant response to treatment - ipratropium-albuterol (DUONEB) 0.5-2.5 (3) MG/3ML nebulizer solution 3 mL  2. Type 2 diabetes mellitus without complication, with long-term current use of insulin ([place]) Samples of Farxiga given today Encouraged to monitor glucose levels and titrate 70/30 down by 2 units each time her glucose level is below 150 Next A1C due in November  3. Essential hypertension BP and HR stable today on current therapy, continue with routine monitoring  General Counseling: terrilynn postell understanding of the findings of todays visit and agrees with plan of treatment. I have discussed any further diagnostic evaluation that may be needed or ordered today. We also reviewed her medications today. she has been encouraged to call the office with any questions or concerns that should arise related to todays visit.  Meds ordered this encounter  Medications  . ipratropium-albuterol (DUONEB) 0.5-2.5 (3) MG/3ML nebulizer solution 3 mL    Time spent: 30 Minutes Time spent includes review of chart, medications, test results and follow-up plan with the patient.  This patient was seen by Theodoro Grist AGNP-C in Collaboration with Dr Lavera Guise as a part of collaborative care agreement     Tanna Furry. Claretta Kendra AGNP-C Internal medicine

## 2020-01-22 NOTE — Procedures (Signed)
[facility] [place], [id number]  DATE OF SERVICE: January 14, 2020  Complete Pulmonary Function Testing Interpretation:  FINDINGS:  Forced vital capacity is normal.  FEV1 is normal.  FEV1 FVC ratio is mildly decreased.  Postbronchodilator no significant change in FEV1.  Total lung capacity is increased residual volume is increased residual into lung past ratio is increased.  FRC is increased.  DLCO is normal.  IMPRESSION:  This pulmonary function study is within normal limits.  No significant response to bronchodilators DLCO is also normal.  Allyne Gee, MD [facility] Pulmonary Critical Care Medicine Sleep Medicine

## 2020-01-23 ENCOUNTER — Other Ambulatory Visit: Payer: Self-pay

## 2020-01-23 DIAGNOSIS — E1165 Type 2 diabetes mellitus with hyperglycemia: Secondary | ICD-10-CM

## 2020-01-23 MED ORDER — ACCU-CHEK SMARTVIEW VI STRP: ORAL_STRIP | 11 refills | Status: DC

## 2020-01-24 ENCOUNTER — Encounter: Payer: Self-pay | Admitting: Hospice and Palliative Medicine

## 2020-02-03 ENCOUNTER — Other Ambulatory Visit: Payer: Self-pay

## 2020-02-03 ENCOUNTER — Encounter: Payer: Self-pay | Admitting: Internal Medicine

## 2020-02-03 ENCOUNTER — Ambulatory Visit (INDEPENDENT_AMBULATORY_CARE_PROVIDER_SITE_OTHER): Payer: Medicare HMO | Admitting: Internal Medicine

## 2020-02-03 VITALS — BP 124/74 | HR 94 | Temp 98.7°F | Resp 16 | Ht 62.0 in | Wt 156.0 lb

## 2020-02-03 DIAGNOSIS — R059 Cough, unspecified: Secondary | ICD-10-CM | POA: Diagnosis not present

## 2020-02-03 DIAGNOSIS — J301 Allergic rhinitis due to pollen: Secondary | ICD-10-CM | POA: Diagnosis not present

## 2020-02-03 NOTE — Procedures (Signed)
    [facility] Allergy MQT Recording Form  [place] [address] [place], [place] [address] Phone [phone] Fax [phone]   Patient Name: Tiffany Hawkins Age: 74 y.o. Sex: female Date of Service: @SERVICEDATE @   Performing Provider: Allyne Gee MD [facility]         Battery A Back   Site Antigen [facility] Flare  A1 Positive Control 4 5  A2 Negative Control 3 5  A3 American Elm 0 0  A4 Maple Box Elder 0 0  A5 Grass Mix 0 0  A6 Dock Sorrel Mix 0 0  A7 Russian Thistle 0 0  A8 Ragweed Mix 0 0  A9 English Pantain 0 0  A10 Oak Mix  0 0   Battery B Wheal Flare  B1 Lambs Quarters 5 6  B2 Cottonwood 7 7  B3 Pigweed Mix 0 0  B4 Acacia 0 0  B5 Pine Mix 0 0  B6 Privet 0 0  B7 White/Red Mulberry 0 0  B8 Western Water Hemp 0 0  B9 [place] Grass 0 0  B10 Melalucea 0 0   Battery C Wheal Flare  C1 Red River Birch 0 0  C2 Eastern Sycamore 0 0  C3 Bahai Grass 0 0  C4 American Beech 0 0  C5 Ash Mix 0 0  C6 Black Willow 0 0  C7 Hickory 0 0  C8 Black Walnut 0 0  C9 Red Cedar 0 0  C10 Sweet Gum  0 0   Battery D Wheal Flare  D1 Cultivated Oat 0 0  D2 Dog Fennel 0 0  D3 Common Mugwort 0 0  D4 Marsh Elder 0 0  D5 Johnson 0 0  D6 Hackberry Tree 10 11  D7 Bayberry Tree 9 11  D8 Cypress, Bald Tree 0 0  D9 Aspergillus Fumigatus 0 0  D10 Alternia  0 0   Battery E Wheal Flare  E1 Dreschlere 0 0  E2 Fusarium Mix 0 0  E3 Cladosporum Sph 0 0  E4 Bipolaris 0 0  E5 Penicillin chrys 0 0  E6 Cladosporum Herb 0 0  E7 Candida 0 0  E8 Aureobasidium 0 0  E9 Rhizopus 0 0  E10 Botrytis  0 0   Battery F Wheal Flare  F1 Aspergillus [place] 7 8  F2 Dust Mite Mix 8 9  F3 Cockroach Mix 0 0  F4 Cat Hair 0 0  F5 Dog Mixed breeds 0 0  F6 Feather Mix 0 0

## 2020-02-03 NOTE — Patient Instructions (Signed)
Allergy Blood Testing Why am I having this test? Allergy blood testing is used to help diagnose specific allergies. An allergy occurs when your body's defense system (immune system) is more sensitive to certain substances. The immune system overreacts to the substance, causing allergy symptoms. You may have this test to help find out what is causing symptoms such as:  Rashes.  Runny nose.  Sneezing.  Asthma flare-ups. Blood testing is often used when skin testing for allergies is not an option. What is being tested? This test measures the level of immunoglobulin E (IgE) in your blood. IgE is an antibody that the body produces in response to a substance that you are allergic to (allergen). Testing can determine exactly what allergen affects you. A common method used to measure IgE is the radioallergosorbent test (RAST) in which specific allergens are tested. You can be tested for various allergens, including:  Animal dander.  Foods.  Pollens.  Dusts.  Latex.  Molds.  Insect venoms.  Medicines. What kind of sample is taken?  A blood sample is required for this test. It is usually collected by inserting a needle into a blood vessel. Tell a health care provider about:  All medicines you are taking, including vitamins, herbs, eye drops, creams, and over-the-counter medicines.  Any medical conditions you have. How are the results reported? Your test results will be reported as values or RAST ratings. Your health care provider will compare your results to normal ranges that were established after testing a large group of people (reference ranges). Reference ranges may vary among labs and hospitals. For this test, common reference ranges are: Reference ranges for IgE  Adult: 0-100 IU/mL.  Child 0-23 months: 0-13 IU/mL.  Child 2-5 years: 0-56 IU/mL.  Child 6-10 years: 0-85 IU/mL. Reference ranges for RAST  RAST rating 0: IgE level less than 0.35 kU/L.  RAST rating 1: IgE  level 0.35-0.69 kU/L.  RAST rating 2: IgE level 0.70-3.49 kU/L.  RAST rating 3: IgE level 3.50-17.49 kU/L.  RAST rating 4: IgE level 17.50-49.99 kU/L.  RAST rating 5: IgE level 50-100 kU/L.  RAST rating 6: IgE level greater than 100 kU/L. What do the results mean? IgE levels increase when an allergic person is exposed to the allergen. The higher the IgE level in a RAST test, the higher the likelihood of an allergy to that allergen. A RAST rating of:  0 means that you have an absent or undetectable allergen-specific IgE.  1 means that you have a low level of allergen-specific IgE.  2 means that you have a moderate level of allergen-specific IgE.  3 means that you have a high level of allergen-specific IgE.  4 means that you have a very high level of allergen-specific IgE.  5 means that you have a very high level of allergen-specific IgE.  6 means that you have an extremely high level of allergen-specific IgE. Talk with your health care provider about what your results mean. Questions to ask your health care provider Ask your health care provider, or the department that is doing the test:  When will my results be ready?  How will I get my results?  What are my treatment options?  What other tests do I need?  What are my next steps? Summary  Allergy blood testing is used to help diagnose specific allergies.  This test measures the level of IgE in your blood. IgE is an antibody that the body produces in response to an allergen.  A common  method used to measure IgE is the radioallergosorbent test (RAST) in which specific allergens are tested.  Allergens that may be tested include animal dander, foods, pollens, and latex. This information is not intended to replace advice given to you by your health care provider. Make sure you discuss any questions you have with your health care provider. Document Revised: 07/23/2018 Document Reviewed: 01/23/2017 Elsevier Patient  Education  [place].

## 2020-02-09 ENCOUNTER — Other Ambulatory Visit: Payer: Self-pay

## 2020-02-09 DIAGNOSIS — K219 Gastro-esophageal reflux disease without esophagitis: Secondary | ICD-10-CM

## 2020-02-09 MED ORDER — PANTOPRAZOLE SODIUM 20 MG PO TBEC: 40.0000 mg | DELAYED_RELEASE_TABLET | Freq: Every day | ORAL | 1 refills | Status: DC

## 2020-03-01 ENCOUNTER — Other Ambulatory Visit: Payer: Self-pay

## 2020-03-01 ENCOUNTER — Encounter: Payer: Self-pay | Admitting: Nurse Practitioner

## 2020-03-01 ENCOUNTER — Ambulatory Visit (INDEPENDENT_AMBULATORY_CARE_PROVIDER_SITE_OTHER): Payer: Medicare HMO | Admitting: Nurse Practitioner

## 2020-03-01 VITALS — BP 141/79 | HR 94 | Temp 97.5°F | Resp 16 | Ht 62.0 in | Wt 156.4 lb

## 2020-03-01 DIAGNOSIS — E782 Mixed hyperlipidemia: Secondary | ICD-10-CM

## 2020-03-01 DIAGNOSIS — I10 Essential (primary) hypertension: Secondary | ICD-10-CM

## 2020-03-01 DIAGNOSIS — R300 Dysuria: Secondary | ICD-10-CM

## 2020-03-01 DIAGNOSIS — E1165 Type 2 diabetes mellitus with hyperglycemia: Secondary | ICD-10-CM | POA: Diagnosis not present

## 2020-03-01 DIAGNOSIS — M2062 Acquired deformities of toe(s), unspecified, left foot: Secondary | ICD-10-CM

## 2020-03-01 DIAGNOSIS — R269 Unspecified abnormalities of gait and mobility: Secondary | ICD-10-CM

## 2020-03-01 DIAGNOSIS — Z0001 Encounter for general adult medical examination with abnormal findings: Secondary | ICD-10-CM

## 2020-03-01 LAB — POCT GLYCOSYLATED HEMOGLOBIN (HGB A1C): Hemoglobin A1C: 6.3 % — AB (ref 4.0–5.6)

## 2020-03-01 MED ORDER — DAPAGLIFLOZIN PROPANEDIOL 10 MG PO TABS: 10.0000 mg | ORAL_TABLET | Freq: Every day | ORAL | 1 refills | Status: DC

## 2020-03-01 MED ORDER — DAPAGLIFLOZIN PROPANEDIOL 10 MG PO TABS: 10.0000 mg | ORAL_TABLET | Freq: Every day | ORAL | 0 refills | Status: DC

## 2020-03-01 MED ORDER — FENOFIBRATE 54 MG PO TABS: 54.0000 mg | ORAL_TABLET | Freq: Every day | ORAL | 1 refills | Status: DC

## 2020-03-01 NOTE — Progress Notes (Signed)
[facility] [place], [place] [address]  Internal MEDICINE  Office Visit Note  Patient Name: Tiffany Hawkins  [id number]  [id number]  Date of Service: 03/28/2020   Pt is here for routine health maintenance examination  Chief Complaint  Patient presents with  . Medicare Wellness  . Diabetes  . Hyperlipidemia  . Hypertension  . Quality Metric Gaps    flu,lipid panel  . controlled substance form    reviewed with PT  . Cough    still coughing but better      The patient is here for health maintenance exam. Blood sugars are doing well with HgbA1c at 6.3 today. Her blood pressure is well managed. She states that she is feeling well and she has no concerns or complaints today. She is due to have routine, fasting labs. She will need to have screening mammogram.     Current Medication: Outpatient Encounter Medications as of 03/01/2020  Medication Sig  . amLODipine (NORVASC) 5 MG tablet Take 2 tablets (10 mg total) by mouth daily.  . Blood Glucose Calibration (ACCU-CHEK SMARTVIEW CONTROL VI) by In Vitro route. Use as di  . cetirizine (ZYRTEC) 10 MG chewable tablet Chew 1 tablet (10 mg total) by mouth daily.  . chlorpheniramine-HYDROcodone (TUSSIONEX PENNKINETIC ER) 10-8 MG/5ML SUER Take 5 mLs by mouth at bedtime as needed for cough.  . citalopram (CELEXA) 20 MG tablet TAKE 2 TABLETS DAILY FOR DEPRESSION.  . fluticasone (FLONASE) 50 MCG/ACT nasal spray Place 2 sprays into both nostrils daily.  Marland Kitchen glucose blood (ACCU-CHEK SMARTVIEW) test strip Blood sugar checks TID DX E11.65  . insulin aspart protamine - aspart (NOVOLOG MIX 70/30 FLEXPEN) (70-30) 100 UNIT/ML FlexPen Inject 0.35 mLs (35 Units total) into the skin 2 (two) times daily. Inject 35 units [place] QAM. Gradually increase to 40units [place] QPM  . lisinopril (ZESTRIL) 5 MG tablet Take 1 tablet (5 mg total) by mouth daily.  Marland Kitchen lovastatin (MEVACOR) 20 MG tablet TAKE 1 TABLET AT BEDTIME FOR HIGH CHOLESTEROL  .  metaxalone (SKELAXIN) 800 MG tablet Take 1 tablet (800 mg total) by mouth 2 (two) times daily as needed for muscle spasms.  . Multiple Vitamin (MULTIVITAMIN) tablet Take by mouth daily. TAKES 1/2 TABLET  . naproxen (NAPROSYN) 500 MG tablet Take 1 tablet (500 mg total) by mouth 2 (two) times daily as needed for moderate pain.  Marland Kitchen ondansetron (ZOFRAN ODT) 8 MG disintegrating tablet Take 1 tablet (8 mg total) by mouth 2 (two) times daily.  . ondansetron (ZOFRAN) 4 MG tablet Take 1 tablet (4 mg total) by mouth every 8 (eight) hours as needed for nausea or vomiting.  . pantoprazole (PROTONIX) 20 MG tablet Take 2 tablets (40 mg total) by mouth daily.  Marland Kitchen Phenylephrine-APAP-guaiFENesin ([place] FAST-MAX) 10-650-400 MG/20ML LIQD Take by mouth.  . sharps container 1 each by Does not apply route as needed. To use for insulin syringes and needles. Taking insulin at least twice daily.  E11.65  . [DISCONTINUED] fenofibrate 54 MG tablet Take 1 tablet (54 mg total) by mouth daily.  . [DISCONTINUED] fenofibrate 54 MG tablet Take 1 tablet (54 mg total) by mouth daily.  . [DISCONTINUED] ibuprofen (ADVIL) 800 MG tablet Take 1 tablet (800 mg total) by mouth every 8 (eight) hours as needed.  . dapagliflozin propanediol (FARXIGA) 10 MG TABS tablet Take 1 tablet (10 mg total) by mouth daily.  . [EXPIRED] guaiFENesin (MUCINEX) 600 MG 12 hr tablet Take 1 tablet (600 mg total) by  mouth 2 (two) times daily for 10 days.  . [DISCONTINUED] amLODipine (NORVASC) 5 MG tablet Take 2 tablets (10 mg total) by mouth daily.  . [DISCONTINUED] amoxicillin (AMOXIL) 500 MG capsule Take 1 capsule (500 mg total) by mouth 3 (three) times daily. (Patient not taking: Reported on 08/28/2019)  . [DISCONTINUED] benzonatate (TESSALON) 100 MG capsule Take 1 capsule (100 mg total) by mouth every 8 (eight) hours. (Patient not taking: Reported on 08/28/2019)  . [DISCONTINUED] cetirizine (ZYRTEC) 10 MG chewable tablet Chew 1 tablet (10 mg total) by mouth  daily.  . [DISCONTINUED] chlorpheniramine-HYDROcodone (TUSSIONEX PENNKINETIC ER) 10-8 MG/5ML SUER Take 5 mLs by mouth every 12 (twelve) hours as needed for cough. Patient given GoodRx card to help with COPAY cost  . [DISCONTINUED] citalopram (CELEXA) 20 MG tablet Take 20 mg by mouth daily. Take 2 tablets daily for depression  . [DISCONTINUED] clarithromycin (BIAXIN) 500 MG tablet Take 1 tablet (500 mg total) by mouth 2 (two) times daily. (Patient not taking: Reported on 08/28/2019)  . [DISCONTINUED] dapagliflozin propanediol (FARXIGA) 10 MG TABS tablet Take 1 tablet (10 mg total) by mouth daily.  . [DISCONTINUED] fenofibrate 54 MG tablet Take 1 tablet (54 mg total) by mouth daily.  . [DISCONTINUED] fluticasone (FLONASE) 50 MCG/ACT nasal spray Place 2 sprays into both nostrils daily.  . [DISCONTINUED] glucose blood (ACCU-CHEK SMARTVIEW) test strip Blood sugar checks TID DX E11.65  . [DISCONTINUED] ibuprofen (ADVIL) 800 MG tablet Take 1 tablet (800 mg total) by mouth every 8 (eight) hours as needed.  . [DISCONTINUED] insulin aspart protamine - aspart (NOVOLOG MIX 70/30 FLEXPEN) (70-30) 100 UNIT/ML FlexPen Inject 0.21 mLs (21 Units total) into the skin 2 (two) times daily.  . [DISCONTINUED] lisinopril (ZESTRIL) 5 MG tablet Take 1 tablet (5 mg total) by mouth daily.  . [DISCONTINUED] lovastatin (MEVACOR) 20 MG tablet TAKE 1 TABLET AT BEDTIME FOR HIGH CHOLESTEROL  . [DISCONTINUED] pantoprazole (PROTONIX) 20 MG tablet Take 2 tablets (40 mg total) by mouth daily.   No facility-administered encounter medications on file as of 03/01/2020.    Surgical History: Past Surgical History:  Procedure Laterality Date  . APPENDECTOMY    . BACK SURGERY    . CATARACT EXTRACTION W/PHACO Left 12/03/2017   Procedure: CATARACT EXTRACTION PHACO AND INTRAOCULAR LENS PLACEMENT ([place]) LEFT  DIABETIC;  Surgeon: Eulogio Bear, MD;  Location: [facility];  Service: Ophthalmology;  Laterality: Left;  Diabetic -  insulin  . CATARACT EXTRACTION W/PHACO Right 01/14/2018   Procedure: CATARACT EXTRACTION PHACO AND INTRAOCULAR LENS PLACEMENT ([place]) RIGHT DIABETIC;  Surgeon: Eulogio Bear, MD;  Location: [facility];  Service: Ophthalmology;  Laterality: Right;  Diabetic - insulin  . CERVICAL DISC SURGERY    . CHOLECYSTECTOMY    . COLON SURGERY    . COLONOSCOPY    . COLONOSCOPY WITH PROPOFOL N/A 05/17/2015   Procedure: COLONOSCOPY WITH PROPOFOL;  Surgeon: Manya Silvas, MD;  Location[facility] ENDOSCOPY;  Service: Endoscopy;  Laterality: N/A;  . COLONOSCOPY WITH PROPOFOL N/A 04/01/2018   Procedure: COLONOSCOPY WITH BIOPSY;  Surgeon: [name], MD;  Location: [place];  Service: Endoscopy;  Laterality: N/A;  Diabetic - insulin  . ESOPHAGOGASTRODUODENOSCOPY    . HERNIA REPAIR    . POLYPECTOMY N/A 04/01/2018   Procedure: POLYPECTOMY;  Surgeon: [name], MD;  Location: [facility];  Service: Endoscopy;  Laterality: N/A;  . PORT A CATH INJECTION ([place] [facility])     Port has been removed  . TUBAL LIGATION  Medical History: Past Medical History:  Diagnosis Date  . Anxiety   . Colon cancer ([place]) 10/12/2014   Stage IIB; had chemo  . Diabetes mellitus without complication ([place])   . Hyperlipidemia   . Hypertension   . Lateral epicondylitis   . Personal history of chemotherapy   . Polio    childhood  . Skin cancer    Sqamous Cell, Lt Calf  . Sleep apnea    no CPAP, sx improved with wt loss    Family History: Family History  Problem Relation Age of Onset  . Alzheimer's disease Mother   . Heart disease Father   . Colon cancer Cousin   . Colon cancer Cousin   . Cancer Cousin        Brain Tumor  . Breast cancer Neg Hx       Review of Systems  Constitutional: Negative for activity change, chills, fatigue and unexpected weight change.  HENT: Negative for congestion, postnasal drip, rhinorrhea, sneezing and sore throat.   Respiratory: Negative for cough, chest  tightness, shortness of breath and wheezing.   Cardiovascular: Negative for chest pain and palpitations.  Gastrointestinal: Negative for abdominal pain, constipation, diarrhea, nausea and vomiting.  Endocrine: Negative for cold intolerance, heat intolerance, polydipsia and polyuria.       Blood sugars doing well   Genitourinary: Negative for dysuria, frequency and urgency.  Musculoskeletal: Negative for arthralgias, back pain, joint swelling and neck pain.  Skin: Negative for rash.  Allergic/Immunologic: Positive for environmental allergies.  Neurological: Positive for dizziness and weakness. Negative for tremors and numbness.       Improved some since most recent visit with me.   Hematological: Negative for adenopathy. Does not bruise/bleed easily.  Psychiatric/Behavioral: Positive for dysphoric mood. Negative for behavioral problems (Depression), sleep disturbance and suicidal ideas. The patient is nervous/anxious.     Today's Vitals   03/01/20 1139  BP: (!) 141/79  Pulse: 94  Resp: 16  Temp: (!) 97.5 F (36.4 C)  SpO2: 97%  Weight: 156 lb 6.4 oz (70.9 kg)  Height: 5\' 2"  (1.575 m)   Body mass index is 28.61 kg/m.  Physical Exam Vitals and nursing note reviewed.  Constitutional:      General: She is not in acute distress.    Appearance: Normal appearance. She is well-developed and well-nourished. She is not diaphoretic.  HENT:     Head: Normocephalic and atraumatic.     Mouth/Throat:     Mouth: Oropharynx is clear and moist.     Pharynx: No oropharyngeal exudate.  Eyes:     Extraocular Movements: EOM normal.     Pupils: Pupils are equal, round, and reactive to light.  Neck:     Thyroid: No thyromegaly.     Vascular: No carotid bruit or JVD.     Trachea: No tracheal deviation.  Cardiovascular:     Rate and Rhythm: Normal rate and regular rhythm.     Pulses:          Dorsalis pedis pulses are 1+ on the right side and 1+ on the left side.       Posterior tibial pulses  are 1+ on the right side and 1+ on the left side.     Heart sounds: Normal heart sounds. No murmur heard. No friction rub. No gallop.   Pulmonary:     Effort: Pulmonary effort is normal. No respiratory distress.     Breath sounds: Normal breath sounds. No wheezing or rales.  Chest:  Chest wall: No tenderness.  Abdominal:     General: Bowel sounds are normal.     Palpations: Abdomen is soft.     Tenderness: There is no abdominal tenderness.  Musculoskeletal:        General: Normal range of motion.     Cervical back: Normal range of motion and neck supple.     Right foot: Normal range of motion. No deformity or bunion.     Left foot: Normal range of motion. Deformity present. No bunion.  Feet:     Right foot:     Protective Sensation: 10 sites tested. 10 sites sensed.     Skin integrity: Skin integrity normal.     Toenail Condition: Right toenails are normal.     Left foot:     Protective Sensation: 10 sites tested. 10 sites sensed.     Skin integrity: Skin integrity normal.     Toenail Condition: Left toenails are normal.     Comments: Boy deformity of left fifth toe which is starting to curly under the foot. Causing discomfort when wearing shoes or when on her feet for longer periods of time.  Lymphadenopathy:     Cervical: No cervical adenopathy.  Skin:    General: Skin is warm and dry.  Neurological:     Mental Status: She is alert and oriented to person, place, and time. Mental status is at baseline.     Cranial Nerves: No cranial nerve deficit.  Psychiatric:        Mood and Affect: Mood and affect and mood normal.        Behavior: Behavior normal.        Thought Content: Thought content normal.        Judgment: Judgment normal.    Dep[facility] screen [facility] 2/[facility] 03/01/2020 01/21/2020 01/06/2020 12/30/2019 12/02/2019  Decreased Interest 0 0 0 0 0  Down, Depressed, Hopeless 0 0 0 0 0  PHQ - 2 Score 0 0 0 0 0    Functional Status Survey: Is the patient deaf or have  difficulty hearing?: No Does the patient have difficulty seeing, even when wearing glasses/contacts?: Yes Does the patient have difficulty concentrating, remembering, or making decisions?: Yes Does the patient have difficulty walking or climbing stairs?: Yes Does the patient have difficulty dressing or bathing?: No Does the patient have difficulty doing errands alone such as visiting a doctor's office or shopping?: No  MMSE - Mini Mental State Exam 03/01/2020 02/24/2019 02/22/2018  Orientation to time 5 5 5   Orientation to Place 5 5 5   Registration 3 3 3   Attention/ Calculation 5 5 5   Recall 3 3 3   Language- name 2 objects 2 2 2   Language- repeat 1 1 1   Language- follow 3 step command 3 3 3   Language- read & follow direction 1 1 1   Write a sentence 1 1 1   Copy design 1 1 1   Total score 30 30 30     Fall Risk  03/01/2020 01/21/2020 01/06/2020 12/30/2019 12/02/2019  Falls in the past year? 0 0 0 0 0  Number falls in past yr: - - - - -  Injury with Fall? - - - - -      LABS: Recent Results (from the past 2160 hour(s))  Pulmonary function test     Status: None   Collection Time: 01/14/20  2:30 PM  Result Value Ref Range   FEV1     FVC     FEV1/FVC  TLC     DLCO    POCT HgB A1C     Status: Abnormal   Collection Time: 03/01/20 12:00 PM  Result Value Ref Range   Hemoglobin A1C 6.3 (A) 4.0 - 5.6 %   HbA1c POC (<> result, manual entry)     HbA1c, POC (prediabetic range)     HbA1c, POC (controlled diabetic range)    UA/M w/rflx Culture, Routine     Status: Abnormal   Collection Time: 03/01/20  4:41 PM   Specimen: Urine   Urine  Result Value Ref Range   Specific Gravity, UA 1.013 1.005 - 1.030   pH, UA 5.0 5.0 - 7.5   Color, UA Yellow Yellow   Appearance Ur Clear Clear   Leukocytes,UA Negative Negative   Protein,UA 1+ (A) Negative/Trace   Glucose, UA Negative Negative   Ketones, UA Negative Negative   RBC, UA Negative Negative   Bilirubin, UA Negative Negative    Urobilinogen, Ur 0.2 0.2 - 1.0 mg/dL   Nitrite, UA Negative Negative   Microscopic Examination See below:     Comment: Microscopic was indicated and was performed.   Urinalysis Reflex Comment     Comment: This specimen will not reflex to a Urine Culture.  Microscopic Examination     Status: None   Collection Time: 03/01/20  4:41 PM   Urine  Result Value Ref Range   WBC, UA None seen 0 - 5 /hpf   RBC None seen 0 - 2 /hpf   Epithelial Cells (non renal) 0-10 0 - 10 /hpf   Casts None seen None seen /lpf   Bacteria, UA None seen None seen/Few    .Assessment/Plan: 1. Encounter for general adult medical examination with abnormal findings Annual health maintenance exam today. Order slip given to check routine, fasiing labs.   2. Uncontrolled type 2 diabetes mellitus with hyperglycemia (HCC) - POCT HgB A1C 6.3 today. Continue all diabetic medication as prescribed  - dapagliflozin propanediol (FARXIGA) 10 MG TABS tablet; Take 1 tablet (10 mg total) by mouth daily.  Dispense: 90 tablet; Refill: 1  3. Essential hypertension Stable. Continue bp medication as prescribed   4. Mixed hyperlipidemia Check fasting lipid panel and adjust medication as indicated   5. Deformity of toe of left foot Fifth toe of left foot beginning to curl under the foot causing great discomfort. Refer to podiatry for further evaluation and treatment.  - Ambulatory referral to Podiatry  6. Abnormality of gait and mobility Continues to have dizziness and unstable gait. Seeing neurology. Continue visits as scheduled.   7. Dysuria - UA/M w/rflx Culture, Routine  General Counseling: reilynn lauro understanding of the findings of todays visit and agrees with plan of treatment. I have discussed any further diagnostic evaluation that may be needed or ordered today. We also reviewed her medications today. she has been encouraged to call the office with any questions or concerns that should arise related to todays  visit.    Counseling:  This patient was seen by Leretha Pol FNP Collaboration with Dr Lavera Guise as a part of collaborative care agreement  Orders Placed This Encounter  Procedures  . Microscopic Examination  . UA/M w/rflx Culture, Routine  . Ambulatory referral to Podiatry  . POCT HgB A1C    Meds ordered this encounter  Medications  . DISCONTD: dapagliflozin propanediol (FARXIGA) 10 MG TABS tablet    Sig: Take 1 tablet (10 mg total) by mouth daily.    Dispense:  30  tablet    Refill:  0    Patient did well with samples. HgbA1c 6.3. able to cut back dosing of Novolin    Order Specific Question:   Supervising Provider    Answer:   Lavera Guise [place]  . dapagliflozin propanediol (FARXIGA) 10 MG TABS tablet    Sig: Take 1 tablet (10 mg total) by mouth daily.    Dispense:  90 tablet    Refill:  1    Patient did well with samples. HgbA1c 6.3. able to cut back dosing of Novolin    Order Specific Question:   Supervising Provider    Answer:   Lavera Guise [place]  . DISCONTD: fenofibrate 54 MG tablet    Sig: Take 1 tablet (54 mg total) by mouth daily.    Dispense:  90 tablet    Refill:  1    Order Specific Question:   Supervising Provider    Answer:   Lavera Guise [[id number]]    Total time spent: 61 Minutes  Time spent includes review of chart, medications, test results, and follow up plan with the patient.     Lavera Guise, MD  Internal Medicine

## 2020-03-02 ENCOUNTER — Ambulatory Visit (INDEPENDENT_AMBULATORY_CARE_PROVIDER_SITE_OTHER): Payer: Medicare HMO | Admitting: Internal Medicine

## 2020-03-02 ENCOUNTER — Encounter: Payer: Self-pay | Admitting: Internal Medicine

## 2020-03-02 VITALS — BP 124/70 | HR 96 | Temp 97.9°F | Resp 16 | Ht 62.0 in | Wt 156.0 lb

## 2020-03-02 DIAGNOSIS — G4733 Obstructive sleep apnea (adult) (pediatric): Secondary | ICD-10-CM | POA: Diagnosis not present

## 2020-03-02 DIAGNOSIS — J301 Allergic rhinitis due to pollen: Secondary | ICD-10-CM

## 2020-03-02 DIAGNOSIS — J40 Bronchitis, not specified as acute or chronic: Secondary | ICD-10-CM

## 2020-03-02 LAB — UA/M W/RFLX CULTURE, ROUTINE
Bilirubin, UA: NEGATIVE
Glucose, UA: NEGATIVE
Ketones, UA: NEGATIVE
Leukocytes,UA: NEGATIVE
Nitrite, UA: NEGATIVE
RBC, UA: NEGATIVE
Specific Gravity, UA: 1.013 (ref 1.005–1.030)
Urobilinogen, Ur: 0.2 mg/dL (ref 0.2–1.0)
pH, UA: 5 (ref 5.0–7.5)

## 2020-03-02 LAB — MICROSCOPIC EXAMINATION
Bacteria, UA: NONE SEEN
Casts: NONE SEEN /lpf
RBC, Urine: NONE SEEN /hpf (ref 0–2)
WBC, UA: NONE SEEN /hpf (ref 0–5)

## 2020-03-02 NOTE — Patient Instructions (Signed)
Sleep  Sleep Apnea Sleep apnea affects breathing during sleep. It causes breathing to stop for a short time or to become shallow. It can also increase the risk of:  Heart attack.  Stroke.  Being very overweight (obese).  Diabetes.  Heart failure.  Irregular heartbeat. The goal of treatment is to help you breathe normally again. What are the causes? There are three kinds of sleep apnea:  Obstructive sleep apnea. This is caused by a blocked or collapsed airway.  Central sleep apnea. This happens when the brain does not send the right signals to the muscles that control breathing.  Mixed sleep apnea. This is a combination of obstructive and central sleep apnea. The most common cause of this condition is a collapsed or blocked airway. This can happen if:  Your throat muscles are too relaxed.  Your tongue and tonsils are too large.  You are overweight.  Your airway is too small. What increases the risk?  Being overweight.  Smoking.  Having a small airway.  Being older.  Being female.  Drinking alcohol.  Taking medicines to calm yourself (sedatives or tranquilizers).  Having family members with the condition. What are the signs or symptoms?  Trouble staying asleep.  Being sleepy or tired during the day.  Getting angry a lot.  Loud snoring.  Headaches in the morning.  Not being able to focus your mind (concentrate).  Forgetting things.  Less interest in sex.  Mood swings.  Personality changes.  Feelings of sadness (depression).  Waking up a lot during the night to pee (urinate).  Dry mouth.  Sore throat. How is this diagnosed?  Your medical history.  A physical exam.  A test that is done when you are sleeping (sleep study). The test is most often done in a sleep lab but may also be done at home. How is this treated?   Sleeping on your side.  Using a medicine to get rid of mucus in your nose (decongestant).  Avoiding the use of  alcohol, medicines to help you relax, or certain pain medicines (narcotics).  Losing weight, if needed.  Changing your diet.  Not smoking.  Using a machine to open your airway while you sleep, such as: ? An oral appliance. This is a mouthpiece that shifts your lower jaw forward. ? A CPAP device. This device blows air through a mask when you breathe out (exhale). ? An EPAP device. This has valves that you put in each nostril. ? A BPAP device. This device blows air through a mask when you breathe in (inhale) and breathe out.  Having surgery if other treatments do not work. It is important to get treatment for sleep apnea. Without treatment, it can lead to:  High blood pressure.  Coronary artery disease.  In men, not being able to have an erection (impotence).  Reduced thinking ability. Follow these instructions at home: Lifestyle  Make changes that your doctor recommends.  Eat a healthy diet.  Lose weight if needed.  Avoid alcohol, medicines to help you relax, and some pain medicines.  Do not use any products that contain nicotine or tobacco, such as cigarettes, e-cigarettes, and chewing tobacco. If you need help quitting, ask your doctor. General instructions  Take over-the-counter and prescription medicines only as told by your doctor.  If you were given a machine to use while you sleep, use it only as told by your doctor.  If you are having surgery, make sure to tell your doctor you have sleep  apnea. You may need to bring your device with you.  Keep all follow-up visits as told by your doctor. This is important. Contact a doctor if:  The machine that you were given to use during sleep bothers you or does not seem to be working.  You do not get better.  You get worse. Get help right away if:  Your chest hurts.  You have trouble breathing in enough air.  You have an uncomfortable feeling in your back, arms, or stomach.  You have trouble talking.  One side  of your body feels weak.  A part of your face is hanging down. These symptoms may be an emergency. Do not wait to see if the symptoms will go away. Get medical help right away. Call your local emergency services (911 in the [place] Do not drive yourself to the hospital. Summary  This condition affects breathing during sleep.  The most common cause is a collapsed or blocked airway.  The goal of treatment is to help you breathe normally while you sleep. This information is not intended to replace advice given to you by your health care provider. Make sure you discuss any questions you have with your health care provider. Document Revised: 01/18/2018 Document Reviewed: 11/27/2017 Elsevier Patient Education  [place].

## 2020-03-02 NOTE — Progress Notes (Signed)
[facility] [place] [place], [place] [address]  Pulmonary Sleep Medicine   Office Visit Note  Patient Name: Tiffany Hawkins DOB: 05/06/1945 MRN [id number]  Date of Service: 03/02/2020  Complaints/HPI: Cough improved. She had PFT done and this reveals normal lung function. She has been treated for bronchitis and now she states that she is better. Patient is no longer on CPAP and apparently is no longer using the CPAP. She has gained a lot of the weight back that she had gained. She states that she feels tired most of the time. She has been having these symptoms since she had colon cancer. She also had hernia surgery and feels as though she has not really recovered from it.  She also states that she snores it seems to be worse when she lays on her back.  She states that she does not have any headaches.  She takes no medications that would be sedating.  She wakes up and she is extremely exhausted.  And during the daytime she feels sleepy and could be able to take a nap.  ROS  General: (-) fever, (-) chills, (-) night sweats, (-) weakness Skin: (-) rashes, (-) itching,. Eyes: (-) visual changes, (-) redness, (-) itching. Nose and Sinuses: (-) nasal stuffiness or itchiness, (-) postnasal drip, (-) nosebleeds, (-) sinus trouble. Mouth and Throat: (-) sore throat, (-) hoarseness. Neck: (-) swollen glands, (-) enlarged thyroid, (-) neck pain. Respiratory: - cough, (-) bloody sputum, + shortness of breath, - wheezing. Cardiovascular: - ankle swelling, (-) chest pain. Lymphatic: (-) lymph node enlargement. Neurologic: (-) numbness, (-) tingling. Psychiatric: (-) anxiety, (-) depression   Current Medication: Outpatient Encounter Medications as of 03/02/2020  Medication Sig  . amLODipine (NORVASC) 5 MG tablet Take 2 tablets (10 mg total) by mouth daily.  . Blood Glucose Calibration (ACCU-CHEK SMARTVIEW CONTROL VI) by In Vitro route. Use as di  . cetirizine (ZYRTEC) 10 MG  chewable tablet Chew 1 tablet (10 mg total) by mouth daily.  . chlorpheniramine-HYDROcodone (TUSSIONEX PENNKINETIC ER) 10-8 MG/5ML SUER Take 5 mLs by mouth at bedtime as needed for cough.  . citalopram (CELEXA) 20 MG tablet TAKE 2 TABLETS DAILY FOR DEPRESSION.  . dapagliflozin propanediol (FARXIGA) 10 MG TABS tablet Take 1 tablet (10 mg total) by mouth daily.  . fenofibrate 54 MG tablet Take 1 tablet (54 mg total) by mouth daily.  . fluticasone (FLONASE) 50 MCG/ACT nasal spray Place 2 sprays into both nostrils daily.  Marland Kitchen glucose blood (ACCU-CHEK SMARTVIEW) test strip Blood sugar checks TID DX E11.65  . ibuprofen (ADVIL) 800 MG tablet Take 1 tablet (800 mg total) by mouth every 8 (eight) hours as needed.  . insulin aspart protamine - aspart (NOVOLOG MIX 70/30 FLEXPEN) (70-30) 100 UNIT/ML FlexPen Inject 0.35 mLs (35 Units total) into the skin 2 (two) times daily. Inject 35 units [place] QAM. Gradually increase to 40units [place] QPM  . lisinopril (ZESTRIL) 5 MG tablet Take 1 tablet (5 mg total) by mouth daily.  Marland Kitchen lovastatin (MEVACOR) 20 MG tablet TAKE 1 TABLET AT BEDTIME FOR HIGH CHOLESTEROL  . metaxalone (SKELAXIN) 800 MG tablet Take 1 tablet (800 mg total) by mouth 2 (two) times daily as needed for muscle spasms.  . Multiple Vitamin (MULTIVITAMIN) tablet Take by mouth daily. TAKES 1/2 TABLET  . naproxen (NAPROSYN) 500 MG tablet Take 1 tablet (500 mg total) by mouth 2 (two) times daily as needed for moderate pain.  Marland Kitchen ondansetron (ZOFRAN ODT) 8 MG disintegrating  tablet Take 1 tablet (8 mg total) by mouth 2 (two) times daily.  . ondansetron (ZOFRAN) 4 MG tablet Take 1 tablet (4 mg total) by mouth every 8 (eight) hours as needed for nausea or vomiting.  . pantoprazole (PROTONIX) 20 MG tablet Take 2 tablets (40 mg total) by mouth daily.  Marland Kitchen Phenylephrine-APAP-guaiFENesin ([place] FAST-MAX) 10-650-400 MG/20ML LIQD Take by mouth.  . sharps container 1 each by Does not apply route as needed. To use for insulin  syringes and needles. Taking insulin at least twice daily.  E11.65   No facility-administered encounter medications on file as of 03/02/2020.    Surgical History: Past Surgical History:  Procedure Laterality Date  . APPENDECTOMY    . BACK SURGERY    . CATARACT EXTRACTION W/PHACO Left 12/03/2017   Procedure: CATARACT EXTRACTION PHACO AND INTRAOCULAR LENS PLACEMENT ([place]) LEFT  DIABETIC;  Surgeon: Eulogio Bear, MD;  Location: [place];  Service: Ophthalmology;  Laterality: Left;  Diabetic - insulin  . CATARACT EXTRACTION W/PHACO Right 01/14/2018   Procedure: CATARACT EXTRACTION PHACO AND INTRAOCULAR LENS PLACEMENT ([place]) RIGHT DIABETIC;  Surgeon: Eulogio Bear, MD;  Location: [place];  Service: Ophthalmology;  Laterality: Right;  Diabetic - insulin  . CERVICAL DISC SURGERY    . CHOLECYSTECTOMY    . COLON SURGERY    . COLONOSCOPY    . COLONOSCOPY WITH PROPOFOL N/A 05/17/2015   Procedure: COLONOSCOPY WITH PROPOFOL;  Surgeon: Manya Silvas, MD;  Location: [facility] ENDOSCOPY;  Service: Endoscopy;  Laterality: N/A;  . COLONOSCOPY WITH PROPOFOL N/A 04/01/2018   Procedure: COLONOSCOPY WITH BIOPSY;  Surgeon: [name], MD;  Location: [place];  Service: Endoscopy;  Laterality: N/A;  Diabetic - insulin  . ESOPHAGOGASTRODUODENOSCOPY    . HERNIA REPAIR    . POLYPECTOMY N/A 04/01/2018   Procedure: POLYPECTOMY;  Surgeon: [name], MD;  Location: [place];  Service: Endoscopy;  Laterality: N/A;  . PORT A CATH INJECTION ([place] [facility])     Port has been removed  . TUBAL LIGATION      Medical History: Past Medical History:  Diagnosis Date  . Anxiety   . Colon cancer ([place]) 10/12/2014   Stage IIB; had chemo  . Diabetes mellitus without complication ([place])   . Hyperlipidemia   . Hypertension   . Lateral epicondylitis   . Personal history of chemotherapy   . Polio    childhood  . Skin cancer    Sqamous Cell, Lt Calf  . Sleep apnea    no CPAP,  sx improved with wt loss    Family History: Family History  Problem Relation Age of Onset  . Alzheimer's disease Mother   . Heart disease Father   . Colon cancer Cousin   . Colon cancer Cousin   . Cancer Cousin        Brain Tumor  . Breast cancer Neg Hx     Social History: Social History   Socioeconomic History  . Marital status: Married    Spouse name: Not on file  . Number of children: Not on file  . Years of education: Not on file  . Highest education level: Not on file  Occupational History  . Not on file  Tobacco Use  . Smoking status: Former Smoker    Packs/day: 1.50    Years: 20.00    Pack years: 30.00    Types: Cigarettes    Quit date: 04/17/1981    Years since quitting: 38.9  .  Smokeless tobacco: Never Used  Vaping Use  . Vaping Use: Never used  Substance and Sexual Activity  . Alcohol use: No  . Drug use: No  . Sexual activity: Never  Other Topics Concern  . Not on file  Social History Narrative  . Not on file   Social Determinants of Health   Financial Resource Strain:   . Difficulty of Paying Living Expenses: Not on file  Food Insecurity:   . Worried About Charity fundraiser in the Last Year: Not on file  . Ran Out of Food in the Last Year: Not on file  Transportation Needs:   . Lack of Transportation (Medical): Not on file  . Lack of Transportation (Non-Medical): Not on file  Physical Activity:   . Days of Exercise per Week: Not on file  . Minutes of Exercise per Session: Not on file  Stress:   . Feeling of Stress : Not on file  Social Connections:   . Frequency of Communication with Friends and Family: Not on file  . Frequency of Social Gatherings with Friends and Family: Not on file  . Attends Religious Services: Not on file  . Active Member of Clubs or Organizations: Not on file  . Attends Archivist Meetings: Not on file  . Marital Status: Not on file  Intimate Partner Violence:   . Fear of Current or Ex-Partner: Not on  file  . Emotionally Abused: Not on file  . Physically Abused: Not on file  . Sexually Abused: Not on file    Vital Signs: Blood pressure 124/70, pulse 96, temperature 97.9 F (36.6 C), resp. rate 16, height 5\' 2"  (1.575 m), weight 156 lb (70.8 kg), SpO2 98 %.  Examination: General Appearance: The patient is well-developed, well-nourished, and in no distress. Skin: Gross inspection of skin unremarkable. Head: normocephalic, no gross deformities. Eyes: no gross deformities noted. ENT: ears appear grossly normal no exudates. Neck: Supple. No thyromegaly. No LAD. Respiratory: no rhonchi noted. Cardiovascular: Normal S1 and S2 without murmur or rub. Extremities: No cyanosis. pulses are equal. Neurologic: Alert and oriented. No involuntary movements.  LABS: Recent Results (from the past 2160 hour(s))  Pulmonary function test     Status: None   Collection Time: 01/14/20  2:30 PM  Result Value Ref Range   FEV1     FVC     FEV1/FVC     TLC     DLCO    POCT HgB A1C     Status: Abnormal   Collection Time: 03/01/20 12:00 PM  Result Value Ref Range   Hemoglobin A1C 6.3 (A) 4.0 - 5.6 %   HbA1c POC (<> result, manual entry)     HbA1c, POC (prediabetic range)     HbA1c, POC (controlled diabetic range)    UA/M w/rflx Culture, Routine     Status: Abnormal   Collection Time: 03/01/20  4:41 PM   Specimen: Urine   Urine  Result Value Ref Range   Specific Gravity, UA 1.013 1.005 - 1.030   pH, UA 5.0 5.0 - 7.5   Color, UA Yellow Yellow   Appearance Ur Clear Clear   Leukocytes,UA Negative Negative   Protein,UA 1+ (A) Negative/Trace   Glucose, UA Negative Negative   Ketones, UA Negative Negative   RBC, UA Negative Negative   Bilirubin, UA Negative Negative   Urobilinogen, Ur 0.2 0.2 - 1.0 mg/dL   Nitrite, UA Negative Negative   Microscopic Examination See below:  Comment: Microscopic was indicated and was performed.   Urinalysis Reflex Comment     Comment: This specimen will not  reflex to a Urine Culture.  Microscopic Examination     Status: None   Collection Time: 03/01/20  4:41 PM   Urine  Result Value Ref Range   WBC, UA None seen 0 - 5 /hpf   RBC None seen 0 - 2 /hpf   Epithelial Cells (non renal) 0-10 0 - 10 /hpf   Casts None seen None seen /lpf   Bacteria, UA None seen None seen/Few    Radiology: DG Chest 2 View  Result Date: 08/29/2019 CLINICAL DATA:  Productive cough. EXAM: CHEST - 2 VIEW COMPARISON:  July 21, 2016. FINDINGS: The heart size and mediastinal contours are within normal limits. Both lungs are clear. The visualized skeletal structures are unremarkable. IMPRESSION: No active cardiopulmonary disease. Electronically Signed   By: Marijo Conception M.D.   On: 08/29/2019 08:27    No results found.  No results found.    Assessment and Plan: Patient Active Problem List   Diagnosis Date Noted  . H/O multiple pulmonary nodules 09/22/2019  . Chronic cough 09/22/2019  . Perennial allergic rhinitis 09/17/2019  . Acute upper respiratory infection 08/28/2019  . Weakness 08/28/2019  . Acute bronchitis 06/01/2019  . Cough productive of yellow sputum 06/01/2019  . 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 Chronic kidney disease, stage II (mild) 03/14/2019  . Gastroesophageal reflux disease without esophagitis 03/14/2019  . Intractable episodic headache 03/14/2019  . Need for prophylactic vaccination with combined diphtheria-tetanus-pertussis (DTaP) vaccine 03/14/2019  . Renal cyst 11/27/2018  . Abnormality of gait and mobility 11/27/2018  . Hepatic steatosis 10/31/2018  . 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[place]) 10/31/2018  . Elevated ferritin 10/31/2018  . Vertigo 08/13/2018  . Gastroenteritis 05/09/2018  . Diarrhea 05/09/2018  . Nausea 05/09/2018  . Personal history of colon cancer   . Polyp of sigmoid colon   . Dysuria 02/23/2018  . 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 Encounter for general adult medical examination with  abnormal findings 02/10/2018  . Acute non-recurrent pansinusitis 08/24/2017  . Other headache syndrome 08/24/2017  . Uncontrolled type 2 diabetes mellitus with hyperglycemia ([place]) 08/24/2017  . Type 2 diabetes mellitus without complication, with long-term current use of insulin ([place]) 08/22/2017  . Need for vaccination against Streptococcus pneumoniae using pneumococcal conjugate vaccine 13 08/22/2017  . Essential hypertension 05/03/2017  . Otalgia, bilateral 05/02/2017  . Type 2 diabetes mellitus with hyperglycemia ([place]) 05/02/2017  . Mixed hyperlipidemia 05/02/2017  . Right lower quadrant pain 06/29/2016  . 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 History of colon cancer 04/13/2015  . Cancer of ascending colon ([place]) 10/12/2014    1. Bronchitis resolved at this time will monitor. She is currently on inhlers which she will continue as needed 2. OSA she does have symptoms of increased daytime somnolence.  Along with a previous history of obstructive sleep apnea and other comorbid conditions it is reasonable to go ahead and get a follow-up sleep study done.  She is going to be studied in the lab is baseline study initially 3. Allergic rhinitis controlled she did have allergy test on there was no significant allergies that I feel that she needs to have shots she takes antihistamines and these appear to control her well 4. Obesity she had lost significant weight but then apparently gained it back she needs to work  on diet and exercise and try to lose the weight once again. 5. History of colon cancer she has been cancer free  General Counseling: I have discussed the findings of the evaluation and examination with Ivin Booty.  I have also discussed any further diagnostic evaluation thatmay be needed or ordered today. Arijana verbalizes understanding of the findings of todays visit. We also reviewed her medications today and discussed drug interactions and side effects  including but not limited excessive drowsiness and altered mental states. We also discussed that there is always a risk not just to her but also people around her. she has been encouraged to call the office with any questions or concerns that should arise related to todays visit.  Orders Placed This Encounter  Procedures  . PSG Sleep Study    Standing Status:   Future    Standing Expiration Date:   03/02/2021    Order Specific Question:   Where should this test be performed:    Answer:   [facility]     Time spent: 5  I have personally obtained a history, examined the patient, evaluated laboratory and imaging results, formulated the assessment and plan and placed orders.    Allyne Gee, MD [facility] Pulmonary and Critical Care Sleep medicine

## 2020-03-03 ENCOUNTER — Telehealth: Payer: Self-pay

## 2020-03-03 NOTE — Telephone Encounter (Signed)
Placed psg order in [facility] box for pick up. Tiffany Hawkins

## 2020-03-23 ENCOUNTER — Other Ambulatory Visit: Payer: Self-pay

## 2020-03-23 DIAGNOSIS — R519 Headache, unspecified: Secondary | ICD-10-CM

## 2020-03-23 MED ORDER — IBUPROFEN 800 MG PO TABS: 800.0000 mg | ORAL_TABLET | Freq: Three times a day (TID) | ORAL | 1 refills | Status: DC | PRN

## 2020-03-25 ENCOUNTER — Other Ambulatory Visit: Payer: Self-pay

## 2020-03-25 DIAGNOSIS — E782 Mixed hyperlipidemia: Secondary | ICD-10-CM

## 2020-03-25 MED ORDER — FENOFIBRATE 54 MG PO TABS: 54.0000 mg | ORAL_TABLET | Freq: Every day | ORAL | 1 refills | Status: DC

## 2020-03-28 DIAGNOSIS — M2062 Acquired deformities of toe(s), unspecified, left foot: Secondary | ICD-10-CM | POA: Insufficient documentation

## 2020-04-06 ENCOUNTER — Other Ambulatory Visit: Payer: Self-pay | Admitting: Nurse Practitioner

## 2020-04-06 ENCOUNTER — Telehealth: Payer: Self-pay

## 2020-04-06 DIAGNOSIS — J069 Acute upper respiratory infection, unspecified: Secondary | ICD-10-CM

## 2020-04-06 DIAGNOSIS — R059 Cough, unspecified: Secondary | ICD-10-CM

## 2020-04-06 MED ORDER — BENZONATATE 200 MG PO CAPS
200.0000 mg | ORAL_CAPSULE | Freq: Two times a day (BID) | ORAL | 0 refills | Status: DC | PRN
Start: 2020-04-06 — End: 2020-06-05

## 2020-04-06 MED ORDER — AZITHROMYCIN 250 MG PO TABS: ORAL_TABLET | ORAL | 0 refills | Status: DC

## 2020-04-06 NOTE — Telephone Encounter (Signed)
Pt.notified

## 2020-04-06 NOTE — Telephone Encounter (Signed)
I sent in prescription for z-pack. Take as directed for 5 days. Also added some tessalon perls which can be taken up to three times daily as needed for cough. She should rest, drink plenty of fluid, and use OTC medications to improve acute symptoms.

## 2020-04-15 ENCOUNTER — Other Ambulatory Visit: Payer: Self-pay

## 2020-04-15 MED ORDER — LOVASTATIN 20 MG PO TABS: ORAL_TABLET | ORAL | 1 refills | Status: DC

## 2020-04-26 ENCOUNTER — Other Ambulatory Visit: Payer: Self-pay

## 2020-04-26 MED ORDER — CITALOPRAM HYDROBROMIDE 20 MG PO TABS: ORAL_TABLET | ORAL | 1 refills | Status: DC

## 2020-04-27 ENCOUNTER — Other Ambulatory Visit: Payer: Self-pay

## 2020-04-27 DIAGNOSIS — K219 Gastro-esophageal reflux disease without esophagitis: Secondary | ICD-10-CM

## 2020-04-27 MED ORDER — PANTOPRAZOLE SODIUM 20 MG PO TBEC: 40.0000 mg | DELAYED_RELEASE_TABLET | Freq: Every day | ORAL | 1 refills | Status: DC

## 2020-04-28 ENCOUNTER — Telehealth: Payer: Self-pay

## 2020-04-28 NOTE — Telephone Encounter (Signed)
Patient scheduled with [facility] for sleep study 04/28/20. Tiffany Hawkins

## 2020-05-10 ENCOUNTER — Ambulatory Visit: Payer: Medicare HMO | Admitting: Internal Medicine

## 2020-05-11 ENCOUNTER — Encounter (INDEPENDENT_AMBULATORY_CARE_PROVIDER_SITE_OTHER): Payer: Medicare HMO | Admitting: Internal Medicine

## 2020-05-11 DIAGNOSIS — G4733 Obstructive sleep apnea (adult) (pediatric): Secondary | ICD-10-CM

## 2020-05-12 NOTE — Procedures (Signed)
[place] Report Part I                                                               Phone: [phone] Fax: [phone]  Patient Name: Tiffany Hawkins, Tiffany Hawkins Acquisition Number: [id number]  Date of Birth: 04/30/1945 Acquisition Date: 05/11/2020  Referring Physician: Theodoro Grist, NP     History: The patient is a 75 year old female who was referred for re-evaluation of obstructive sleep apnea. Medical History: anxiety, diabetes type II, hypertension, hyperlipidemia, lateral epicondylitis.  Medications: Norvasc, Accu-Chek SmartVeiw Control VI, Tussionex Pennkinetic, Celexa, Farxiga, Flonase, Advil, Novolog mIX 70/30 FlexPen, Zestril, Mevacor, Skelaxin, multi-vitamin, Naprosyn, Zofran, Protonix, Mucinex Fast-Max.  Procedure: This routine overnight polysomnogram was performed on the [facility] 5 using the standard diagnostic protocol. This included 6 channels of EEG, 2 channels of EOG, chin EMG, bilateral anterior tibialis EMG, nasal/oral thermistor, PTAF (nasal pressure transducer), chest and abdominal wall movements, EKG, pulse oximetry.  Description: The total recording time was 439.7 minutes. The total sleep time was 268.4 minutes. There were a total of 93.0 minutes of wakefulness after sleep onset for a reduced????sleep efficiency of 61.0%. The latency to sleep onset was?prolonged at 78.3 minutes. The R sleep onset latency was?prolonged at 327.5 minutes. Sleep parameters, as a percentage of the total sleep time, demonstrated 6.9% of sleep was in N1 sleep, 85.1% N2, 0.0% N3 and 8.0% R sleep. There were a total of 58 arousals for an arousal index of 13.0 arousals per hour of sleep that was ??slightly elevated.?  Respiratory monitoring demonstrated frequent moderate to severe degree of snoring in all positions. There were 203 apneas and hypopneas for an Apnea Hypopnea Index of 45.4 apneas and hypopneas per hour of sleep. The REM related apnea hypopnea  index was 30.7/hr of REM sleep compared to a NREM AHI of 46.7/hr.  The average duration of the respiratory events was 28.6 seconds with a maximum duration of 82.5 seconds. The respiratory events occurred in all positions, however they were more frequent in the supine position with an AHI of 82.8. The respiratory events were associated with peripheral oxygen desaturations on the average to 84%. The lowest oxygen desaturation associated with a respiratory event was 76%. Additionally, the baseline oxygen saturation during wakefulness was 92%, during NREM sleep averaged 91%, and during REM sleep averaged 90%. The total duration of oxygen < 90% was 88.9 minutes and <80% was 1.8 minutes.  Cardiac monitoring- did not demonstrate transient cardiac decelerations associated with the apneas. There were no significant cardiac rhythm irregularities.   Periodic limb movement monitoring- frequent periodic limb movements were observed but could not be scored due to the frequency of the sleep apnea. Frequent, quasi-periodic limb were observed during periods of wakefulness.  Impression: ???This routine overnight polysomnogram confirmed the presence of severe obstructive sleep apnea with an overall Apnea Hypopnea Index of 45.4 apneas and hypopneas per hour of sleep which increased to 82.8 in the supine position.  Frequent periodic limb movements were observed but could not be scored due to the frequency of the sleep apnea. In addition, frequent, quasi-periodic limb were observed during periods of wakefulness. Sometimes these limb movements subside once the apnea is controlled.  Clinical correlation is suggested.  There was a reduced sleep efficiency with a?slightly elevated arousal index,?increased awakenings, a reduced REM percentage and no slow wave sleep. These findings would appear to be due to the obstructive sleep apnea.???  ??Recommendations:    1. A CPAP titration would be recommended due to the severity of the  sleep apnea. Some supine sleep should be ensured to optimize the titration. 2. Additionally, would recommend weight loss in a patient with a BMI of 29.3.     Allyne Gee, MD, [facility] Diplomate ABMS-Pulmonary, Critical Care and Sleep Medicine  Electronically reviewed and digitally signed      [place] Report Part II  Phone: [phone] Fax: [phone]  Patient last name Elgin Neck Size 13.0 in. Acquisition [id number]  Patient first name Nasteho Weight 155.0 lbs. Started 05/11/2020 at 9:46:47 PM  Birth date Aug 31, 1945 Height 61.0 in. Stopped 05/12/2020 at 5:13:41 AM  Age 58 BMI 29.3 lb/in2 Duration 439.7  Study Type Adult      Report generated by: Adriana Mccallum, RPSGT Sleep Data: Lights Out: 9:53:59 PM Sleep Onset: 11:12:17 PM  Lights On: 5:08:41 AM Sleep Efficiency: 61.0 %  Total Recording Time: 439.7 min Sleep Latency (from Lights Off) 78.3 min  Total Sleep Time (TST): 268.4 min R Latency (from Sleep Onset): 327.5 min  Sleep Period Time: 361.4 min Total number of awakenings: 17  Wake during sleep: 93.0 min Wake After Sleep Onset (WASO): 93.0 min   Sleep Data:         Arousal Summary: Stage  Latency from lights out (min) Latency from sleep onset (min) Duration (min) % Total Sleep Time  Normal values  N 1 78.3 0.0 18.5 6.9 (5%)  N 2 78.8 0.5 228.4 85.1 (50%)  N 3       0.0 0.0 (20%)  R 405.8 327.5 21.5 8.0 (25%)    Number Index  Spontaneous 46 10.3  Apneas & Hypopneas 127 28.4  RERAs 0 0.0       (Apneas & Hypopneas & RERAs)  (127) (28.4)  Limb Movement 0 0.0  Snore 0 0.0  TOTAL 173 38.7      Respiratory Data:  CA OA MA Apnea Hypopnea* A+ H RERA Total  Number 1 106 0 107 96 203 0 203  Mean Dur (sec) 21.5 24.9 0.0 24.9 32.8 28.6 0.0 28.6  Max Dur (sec) 21.5 41.0 0.0 41.0 82.5 82.5 0.0 82.5  Total Dur (min) 0.4 44.0 0.0 44.3 52.4 96.8 0.0 96.8  % of TST 0.1 16.4 0.0 16.5 19.5 36.1 0.0 36.1  Index (#/h TST) 0.2 23.7 0.0 23.9 21.5 45.4  0.0 45.4  *Hypopneas scored based on 4% or greater desaturation.  Sleep Stage:        REM NREM TST  AHI 30.7 46.7 45.4  RDI 30.7 46.7 45.4            Body Position Data:  Sleep (min) TST (%) REM (min) NREM (min) CA (#) OA (#) MA (#) HYP (#) AHI (#/h) RERA (#) RDI (#/h) Desat (#)  Supine 110.1 41.02 1.3 108.8 0 101 0 51 82.8 0 82.8 162  Non-Supine 158.30 58.98 20.20 138.10 1.00 5.00 0.00 45.00 19.33 0 19.33 59.00  Left: 44.0 16.39 0.0 44.0 0 4 0 24 38.2 0 38.2 31  Right: 114.2 42.55 20.2 94.0 1 1 0 21 12.1 0 12.1 28  UP: 0.1 0.04 0.0 0.1 0 0 0 0 0.0 0 0.00 0  Snoring: Total number of snoring episodes  0  Total time with snoring    min (   % of sleep)   Oximetry Distribution:             WK REM NREM TOTAL  Average (%)   92 90 91 91  < 90% 22.1 7.3 59.5 88.9  < 80% 0.8 0.0 1.0 1.8  < 70% 0.7 0.0 0.1 0.8  # of Desaturations* 4 12 205 221  Desat Index (#/hour) 1.5 33.5 49.9 49.5  Desat Max (%) 13 8 22 22   Desat Max Dur (sec) 62.0 112.0 88.0 112.0  Approx Min O2 during sleep 76  Approx min O2 during a respiratory event 76  Was Oxygen added (Y/N) and final rate No:   0 LPM  *Desaturations based on 3% or greater drop from baseline.   Cheyne Stokes Breathing: None Present   Heart Rate Summary:  Average Heart Rate During Sleep 85.3 bpm      Highest Heart Rate During Sleep (95th %) 91.0 bpm      Highest Heart Rate During Sleep 179 bpm (artifact)  Highest Heart Rate During Recording (TIB) 214 bpm (artifact)   Heart Rate Observations: Event Type # Events   Bradycardia 0 Lowest HR Scored: N/A  Sinus Tachycardia During Sleep 0 Highest HR Scored: N/A  Narrow Complex Tachycardia 0 Highest HR Scored: N/A  Wide Complex Tachycardia 0 Highest HR Scored: N/A  Asystole 0 Longest Pause: N/A  Atrial Fibrillation 0 Duration Longest Event: N/A  Other Arrythmias  No Type:    Periodic Limb Movement Data: (Primary legs unless otherwise noted) Total # Limb Movement 0  Limb Movement Index 0.0  Total # PLMS    PLMS Index     Total # PLMS Arousals    PLMS Arousal Index     Percentage Sleep Time with PLMS   min (   % sleep)  Mean Duration limb movements (secs)

## 2020-05-14 ENCOUNTER — Telehealth: Payer: Self-pay | Admitting: Hematology and Oncology

## 2020-05-14 NOTE — Telephone Encounter (Signed)
05/14/2020 Called pt and informed her that her appt on 6/9 had been r/s to 6/23 due to the clinic being closed 6/6-6/10. Pt confirmed this change SRW

## 2020-05-24 ENCOUNTER — Ambulatory Visit: Payer: Medicare HMO | Admitting: Internal Medicine

## 2020-05-24 ENCOUNTER — Encounter: Payer: Self-pay | Admitting: Internal Medicine

## 2020-05-24 ENCOUNTER — Telehealth: Payer: Self-pay

## 2020-05-24 ENCOUNTER — Other Ambulatory Visit: Payer: Self-pay

## 2020-05-24 VITALS — BP 136/88 | HR 95 | Temp 97.3°F | Resp 16 | Ht 62.0 in | Wt 156.0 lb

## 2020-05-24 DIAGNOSIS — K219 Gastro-esophageal reflux disease without esophagitis: Secondary | ICD-10-CM | POA: Diagnosis not present

## 2020-05-24 DIAGNOSIS — Z7189 Other specified counseling: Secondary | ICD-10-CM

## 2020-05-24 DIAGNOSIS — G4733 Obstructive sleep apnea (adult) (pediatric): Secondary | ICD-10-CM

## 2020-05-24 NOTE — Progress Notes (Signed)
[facility] [place],  [address]  Pulmonary Sleep Medicine   Office Visit Note  Patient Name: Tiffany Hawkins DOB: [birth year] MRN [id number]  Date of Service: 05/24/2020  Complaints/HPI: OSA SEVERE with worsening during supine sleep to 82.8 per hour and overall AHI of 45.4 per hour. Needs a new sleep machine.  She will be set up for CPAP evaluation and get her new machine.  She has some issues with forgetfulness.  The daughter was in the room she and I had a conversation I explained to her what needs to be done as far as her machine is concerned  ROS  General: (-) fever, (-) chills, (-) night sweats, (-) weakness Skin: (-) rashes, (-) itching,. Eyes: (-) visual changes, (-) redness, (-) itching. Nose and Sinuses: (-) nasal stuffiness or itchiness, (-) postnasal drip, (-) nosebleeds, (-) sinus trouble. Mouth and Throat: (-) sore throat, (-) hoarseness. Neck: (-) swollen glands, (-) enlarged thyroid, (-) neck pain. Respiratory: - cough, (-) bloody sputum, - shortness of breath, - wheezing. Cardiovascular: - ankle swelling, (-) chest pain. Lymphatic: (-) lymph node enlargement. Neurologic: (-) numbness, (-) tingling. Psychiatric: (-) anxiety, (-) depression   Current Medication: Outpatient Encounter Medications as of 05/24/2020  Medication Sig  . amLODipine (NORVASC) 5 MG tablet Take 2 tablets (10 mg total) by mouth daily.  . benzonatate (TESSALON) 200 MG capsule Take 1 capsule (200 mg total) by mouth 2 (two) times daily as needed for cough.  . Blood Glucose Calibration (ACCU-CHEK SMARTVIEW CONTROL VI) by In Vitro route. Use as di  . cetirizine (ZYRTEC) 10 MG chewable tablet Chew 1 tablet (10 mg total) by mouth daily.  . citalopram (CELEXA) 20 MG tablet TAKE 2 TABLETS DAILY FOR DEPRESSION.  . fenofibrate 54 MG tablet Take 1 tablet (54 mg total) by mouth daily.  . fluticasone (FLONASE) 50 MCG/ACT nasal spray Place 2 sprays into both nostrils daily.  Marland Kitchen  glucose blood (ACCU-CHEK SMARTVIEW) test strip Blood sugar checks TID DX E11.65  . ibuprofen (ADVIL) 800 MG tablet Take 1 tablet (800 mg total) by mouth every 8 (eight) hours as needed.  . insulin aspart protamine - aspart (NOVOLOG MIX 70/30 FLEXPEN) (70-30) 100 UNIT/ML FlexPen Inject 0.35 mLs (35 Units total) into the skin 2 (two) times daily. Inject 35 units [place] QAM. Gradually increase to 40units [place] QPM  . lisinopril (ZESTRIL) 5 MG tablet Take 1 tablet (5 mg total) by mouth daily.  Marland Kitchen lovastatin (MEVACOR) 20 MG tablet TAKE 1 TABLET AT BEDTIME FOR HIGH CHOLESTEROL  . metaxalone (SKELAXIN) 800 MG tablet Take 1 tablet (800 mg total) by mouth 2 (two) times daily as needed for muscle spasms.  . Multiple Vitamin (MULTIVITAMIN) tablet Take by mouth daily. TAKES 1/2 TABLET  . naproxen (NAPROSYN) 500 MG tablet Take 1 tablet (500 mg total) by mouth 2 (two) times daily as needed for moderate pain.  Marland Kitchen ondansetron (ZOFRAN ODT) 8 MG disintegrating tablet Take 1 tablet (8 mg total) by mouth 2 (two) times daily.  . ondansetron (ZOFRAN) 4 MG tablet Take 1 tablet (4 mg total) by mouth every 8 (eight) hours as needed for nausea or vomiting.  . pantoprazole (PROTONIX) 20 MG tablet Take 2 tablets (40 mg total) by mouth daily.  . sharps container 1 each by Does not apply route as needed. To use for insulin syringes and needles. Taking insulin at least twice daily.  E11.65  . azithromycin (ZITHROMAX) 250 MG tablet z-pack - take as directed  for 5 days (Patient not taking: Reported on 05/24/2020)  . chlorpheniramine-HYDROcodone (TUSSIONEX PENNKINETIC ER) 10-8 MG/5ML SUER Take 5 mLs by mouth at bedtime as needed for cough. (Patient not taking: Reported on 05/24/2020)  . dapagliflozin propanediol (FARXIGA) 10 MG TABS tablet Take 1 tablet (10 mg total) by mouth daily. (Patient not taking: Reported on 05/24/2020)  . Phenylephrine-APAP-guaiFENesin ([place] FAST-MAX) 10-650-400 MG/20ML LIQD Take by mouth. (Patient not taking: Reported on  05/24/2020)   No facility-administered encounter medications on file as of 05/24/2020.    Surgical History: Past Surgical History:  Procedure Laterality Date  . APPENDECTOMY    . BACK SURGERY    . CATARACT EXTRACTION W/PHACO Left 12/03/2017   Procedure: CATARACT EXTRACTION PHACO AND INTRAOCULAR LENS PLACEMENT ([place]) LEFT  DIABETIC;  Surgeon: Eulogio Bear, MD;  Location: [place];  Service: Ophthalmology;  Laterality: Left;  Diabetic - insulin  . CATARACT EXTRACTION W/PHACO Right 01/14/2018   Procedure: CATARACT EXTRACTION PHACO AND INTRAOCULAR LENS PLACEMENT ([place]) RIGHT DIABETIC;  Surgeon: Eulogio Bear, MD;  Location: [place];  Service: Ophthalmology;  Laterality: Right;  Diabetic - insulin  . CERVICAL DISC SURGERY    . CHOLECYSTECTOMY    . COLON SURGERY    . COLONOSCOPY    . COLONOSCOPY WITH PROPOFOL N/A 05/17/2015   Procedure: COLONOSCOPY WITH PROPOFOL;  Surgeon: Manya Silvas, MD;  Location: [facility] ENDOSCOPY;  Service: Endoscopy;  Laterality: N/A;  . COLONOSCOPY WITH PROPOFOL N/A 04/01/2018   Procedure: COLONOSCOPY WITH BIOPSY;  Surgeon: [name], MD;  Location: [place];  Service: Endoscopy;  Laterality: N/A;  Diabetic - insulin  . ESOPHAGOGASTRODUODENOSCOPY    . HERNIA REPAIR    . POLYPECTOMY N/A 04/01/2018   Procedure: POLYPECTOMY;  Surgeon: [name], MD;  Location: [place];  Service: Endoscopy;  Laterality: N/A;  . PORT A CATH INJECTION ([place] HX)     Port has been removed  . TUBAL LIGATION      Medical History: Past Medical History:  Diagnosis Date  . Anxiety   . Colon cancer ([place]) 10/12/2014   Stage IIB; had chemo  . Diabetes mellitus without complication ([place])   . Hyperlipidemia   . Hypertension   . Lateral epicondylitis   . Personal history of chemotherapy   . Polio    childhood  . Skin cancer    Sqamous Cell, Lt Calf  . Sleep apnea    no CPAP, sx improved with wt loss    Family History: Family  History  Problem Relation Age of Onset  . Alzheimer's disease Mother   . Heart disease Father   . Colon cancer Cousin   . Colon cancer Cousin   . Cancer Cousin        Brain Tumor  . Breast cancer Neg Hx     Social History: Social History   Socioeconomic History  . Marital status: Married    Spouse name: Not on file  . Number of children: Not on file  . Years of education: Not on file  . Highest education level: Not on file  Occupational History  . Not on file  Tobacco Use  . Smoking status: Former Smoker    Packs/day: 1.50    Years: 20.00    Pack years: 30.00    Types: Cigarettes    Quit date: 04/17/1981    Years since quitting: 39.1  . Smokeless tobacco: Never Used  Vaping Use  . Vaping Use: Never used  Substance and Sexual  Activity  . Alcohol use: No  . Drug use: No  . Sexual activity: Never  Other Topics Concern  . Not on file  Social History Narrative  . Not on file   Social Determinants of Health   Financial Resource Strain: Not on file  Food Insecurity: Not on file  Transportation Needs: Not on file  Physical Activity: Not on file  Stress: Not on file  Social Connections: Not on file  Intimate Partner Violence: Not on file    Vital Signs: Blood pressure 136/88, pulse 95, temperature (!) 97.3 F (36.3 C), resp. rate 16, height 5\' 2"  (1.575 m), weight 156 lb (70.8 kg), SpO2 98 %.  Examination: General Appearance: The patient is well-developed, well-nourished, and in no distress. Skin: Gross inspection of skin unremarkable. Head: normocephalic, no gross deformities. Eyes: no gross deformities noted. ENT: ears appear grossly normal no exudates. Neck: Supple. No thyromegaly. No LAD. Respiratory: no rhonchi noted. Cardiovascular: Normal S1 and S2 without murmur or rub. Extremities: No cyanosis. pulses are equal. Neurologic: Alert and oriented. No involuntary movements.  LABS: Recent Results (from the past 2160 hour(s))  POCT HgB A1C     Status:  Abnormal   Collection Time: 03/01/20 12:00 PM  Result Value Ref Range   Hemoglobin A1C 6.3 (A) 4.0 - 5.6 %   HbA1c POC (<> result, manual entry)     HbA1c, POC (prediabetic range)     HbA1c, POC (controlled diabetic range)    UA/M w/rflx Culture, Routine     Status: Abnormal   Collection Time: 03/01/20  4:41 PM   Specimen: Urine   Urine  Result Value Ref Range   Specific Gravity, UA 1.013 1.005 - 1.030   pH, UA 5.0 5.0 - 7.5   Color, UA Yellow Yellow   Appearance Ur Clear Clear   Leukocytes,UA Negative Negative   Protein,UA 1+ (A) Negative/Trace   Glucose, UA Negative Negative   Ketones, UA Negative Negative   RBC, UA Negative Negative   Bilirubin, UA Negative Negative   Urobilinogen, Ur 0.2 0.2 - 1.0 mg/dL   Nitrite, UA Negative Negative   Microscopic Examination See below:     Comment: Microscopic was indicated and was performed.   Urinalysis Reflex Comment     Comment: This specimen will not reflex to a Urine Culture.  Microscopic Examination     Status: None   Collection Time: 03/01/20  4:41 PM   Urine  Result Value Ref Range   WBC, UA None seen 0 - 5 /hpf   RBC None seen 0 - 2 /hpf   Epithelial Cells (non renal) 0-10 0 - 10 /hpf   Casts None seen None seen /lpf   Bacteria, UA None seen None seen/Few    Radiology: DG Chest 2 View  Result Date: 08/29/2019 CLINICAL DATA:  Productive cough. EXAM: CHEST - 2 VIEW COMPARISON:  July 21, 2016. FINDINGS: The heart size and mediastinal contours are within normal limits. Both lungs are clear. The visualized skeletal structures are unremarkable. IMPRESSION: No active cardiopulmonary disease. Electronically Signed   By: Marijo Conception M.D.   On: 08/29/2019 08:27    No results found.  No results found.    Assessment and Plan: Patient Active Problem List   Diagnosis Date Noted  . Deformity of toe of left foot 03/28/2020  . H/O multiple pulmonary nodules 09/22/2019  . Chronic cough 09/22/2019  . Perennial allergic  rhinitis 09/17/2019  . Acute upper respiratory infection 08/28/2019  .  Weakness 08/28/2019  . Acute bronchitis 06/01/2019  . Cough productive of yellow sputum 06/01/2019  . 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 Chronic kidney disease, stage II (mild) 03/14/2019  . Gastroesophageal reflux disease without esophagitis 03/14/2019  . Intractable episodic headache 03/14/2019  . Need for prophylactic vaccination with combined diphtheria-tetanus-pertussis (DTaP) vaccine 03/14/2019  . Renal cyst 11/27/2018  . Abnormality of gait and mobility 11/27/2018  . Hepatic steatosis 10/31/2018  . 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 Elevated ferritin 10/31/2018  . Vertigo 08/13/2018  . Gastroenteritis 05/09/2018  . Diarrhea 05/09/2018  . Nausea 05/09/2018  . Personal history of colon cancer   . Polyp of sigmoid colon   . Dysuria 02/23/2018  . 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 Encounter for general adult medical examination with abnormal findings 02/10/2018  . Acute non-recurrent pansinusitis 08/24/2017  . Other headache syndrome 08/24/2017  . Uncontrolled type 2 diabetes mellitus with hyperglycemia ([place]) 08/24/2017  . Type 2 diabetes mellitus without complication, with long-term current use of insulin ([place]) 08/22/2017  . Need for vaccination against Streptococcus pneumoniae using pneumococcal conjugate vaccine 13 08/22/2017  . Essential hypertension 05/03/2017  . Otalgia, bilateral 05/02/2017  . Type 2 diabetes mellitus with hyperglycemia ([place]) 05/02/2017  . Mixed hyperlipidemia 05/02/2017  . Right lower quadrant pain 06/29/2016  . 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 History of colon cancer 04/13/2015  . Cancer of ascending colon ([place]) 10/12/2014    1. OSA (obstructive sleep apnea) Severe disease as noted with the apnea-hypopnea index above she will need to have CPAP titration we will go ahead and get this done so  that she can get a new machine  2. Obesity, morbid ([place]) Diet exercise counseling provided  Obesity Counseling: Had a lengthy discussion regarding patients BMI and weight issues. Patient was instructed on portion control as well as increased activity. Also discussed caloric restrictions with trying to maintain intake less than 2000 Kcal. Discussions were made in accordance with the 5As of weight management. Simple actions such as not eating late and if able to, taking a walk is suggested.   3. GERD without esophagitis Appears to be controlled she should use her PPI as prescribed  4. CPAP use counseling CPAP Counseling: had a lengthy discussion with the patient regarding the importance of PAP therapy in management of the sleep apnea. Patient appears to understand the risk factor reduction and also understands the risks associated with untreated sleep apnea. Patient will try to make a good faith effort to remain compliant with therapy. Also instructed the patient on proper cleaning of the device including the water must be changed daily if possible and use of distilled water is preferred. Patient understands that the machine should be regularly cleaned with appropriate recommended cleaning solutions that do not damage the PAP machine for example given white vinegar and water rinses. Other methods such as ozone treatment may not be as good as these simple methods to achieve cleaning.   General Counseling: I have discussed the findings of the evaluation and examination with Tiffany Hawkins.  I have also discussed any further diagnostic evaluation thatmay be needed or ordered today. Tiffany Hawkins verbalizes understanding of the findings of todays visit. We also reviewed her medications today and discussed drug interactions and side effects including but not limited excessive drowsiness and altered mental states. We also discussed that there is always a risk not just to her but also people around her.  she has been  encouraged to call the office with any questions or concerns that should arise related to todays visit.  Orders Placed This Encounter  Procedures  . Cpap titration    Standing Status:   Future    Standing Expiration Date:   05/24/2021    Order Specific Question:   Where should this test be performed:    Answer:   [facility]     Time spent: 55  I have personally obtained a history, examined the patient, evaluated laboratory and imaging results, formulated the assessment and plan and placed orders.    Allyne Gee, MD [facility] Pulmonary and Critical Care Sleep medicine

## 2020-05-24 NOTE — Patient Instructions (Signed)

## 2020-05-24 NOTE — Telephone Encounter (Signed)
Gave [facility] orders for cpap sleep study. Tiffany Hawkins

## 2020-05-25 ENCOUNTER — Other Ambulatory Visit: Payer: Self-pay | Admitting: Nurse Practitioner

## 2020-05-25 DIAGNOSIS — E782 Mixed hyperlipidemia: Secondary | ICD-10-CM | POA: Diagnosis not present

## 2020-05-25 DIAGNOSIS — Z0001 Encounter for general adult medical examination with abnormal findings: Secondary | ICD-10-CM | POA: Diagnosis not present

## 2020-05-25 DIAGNOSIS — E559 Vitamin D deficiency, unspecified: Secondary | ICD-10-CM | POA: Diagnosis not present

## 2020-05-25 DIAGNOSIS — I10 Essential (primary) hypertension: Secondary | ICD-10-CM | POA: Diagnosis not present

## 2020-05-25 DIAGNOSIS — E1165 Type 2 diabetes mellitus with hyperglycemia: Secondary | ICD-10-CM | POA: Diagnosis not present

## 2020-05-26 LAB — CBC
Hematocrit: 47.3 % — ABNORMAL HIGH (ref 34.0–46.6)
Hemoglobin: 15.7 g/dL (ref 11.1–15.9)
MCH: 28.2 pg (ref 26.6–33.0)
MCHC: 33.2 g/dL (ref 31.5–35.7)
MCV: 85 fL (ref 79–97)
Platelets: 220 10*3/uL (ref 150–450)
RBC: 5.56 x10E6/uL — ABNORMAL HIGH (ref 3.77–5.28)
RDW: 13 % (ref 11.7–15.4)
WBC: 7.4 10*3/uL (ref 3.4–10.8)

## 2020-05-26 LAB — COMPREHENSIVE METABOLIC PANEL
ALT: 28 IU/L (ref 0–32)
AST: 23 IU/L (ref 0–40)
Albumin/Globulin Ratio: 1.8 (ref 1.2–2.2)
Albumin: 4.7 g/dL (ref 3.7–4.7)
Alkaline Phosphatase: 51 IU/L (ref 44–121)
BUN/Creatinine Ratio: 15 (ref 12–28)
BUN: 17 mg/dL (ref 8–27)
Bilirubin Total: 0.2 mg/dL (ref 0.0–1.2)
CO2: 21 mmol/L (ref 20–29)
Calcium: 10 mg/dL (ref 8.7–10.3)
Chloride: 103 mmol/L (ref 96–106)
Creatinine, Ser: 1.11 mg/dL — ABNORMAL HIGH (ref 0.57–1.00)
GFR calc Af Amer: 56 mL/min/{1.73_m2} — ABNORMAL LOW (ref >59)
GFR calc non Af Amer: 49 mL/min/{1.73_m2} — ABNORMAL LOW (ref >59)
Globulin, Total: 2.6 g/dL (ref 1.5–4.5)
Glucose: 159 mg/dL — ABNORMAL HIGH (ref 65–99)
Potassium: 4.5 mmol/L (ref 3.5–5.2)
Sodium: 145 mmol/L — ABNORMAL HIGH (ref 134–144)
Total Protein: 7.3 g/dL (ref 6.0–8.5)

## 2020-05-26 LAB — LIPID PANEL WITH LDL/HDL RATIO
Cholesterol, Total: 168 mg/dL (ref 100–199)
HDL: 35 mg/dL — ABNORMAL LOW (ref >39)
LDL Chol Calc (NIH): 90 mg/dL (ref 0–99)
LDL/HDL Ratio: 2.6 ratio (ref 0.0–3.2)
Triglycerides: 257 mg/dL — ABNORMAL HIGH (ref 0–149)
VLDL Cholesterol Cal: 43 mg/dL — ABNORMAL HIGH (ref 5–40)

## 2020-05-26 LAB — VITAMIN D 25 HYDROXY (VIT D DEFICIENCY, FRACTURES): Vit D, 25-Hydroxy: 30.3 ng/mL (ref 30.0–100.0)

## 2020-05-26 LAB — TSH: TSH: 3.01 u[IU]/mL (ref 0.450–4.500)

## 2020-05-26 LAB — T4, FREE: Free T4: 1.04 ng/dL (ref 0.82–1.77)

## 2020-06-01 ENCOUNTER — Encounter: Payer: Self-pay | Admitting: Hospice and Palliative Medicine

## 2020-06-01 ENCOUNTER — Encounter (INDEPENDENT_AMBULATORY_CARE_PROVIDER_SITE_OTHER): Payer: Medicare HMO | Admitting: Internal Medicine

## 2020-06-01 ENCOUNTER — Ambulatory Visit (INDEPENDENT_AMBULATORY_CARE_PROVIDER_SITE_OTHER): Payer: Medicare HMO | Admitting: Hospice and Palliative Medicine

## 2020-06-01 ENCOUNTER — Other Ambulatory Visit: Payer: Self-pay

## 2020-06-01 VITALS — BP 130/75 | HR 94 | Temp 97.5°F | Resp 16 | Ht 62.0 in | Wt 158.0 lb

## 2020-06-01 DIAGNOSIS — G4733 Obstructive sleep apnea (adult) (pediatric): Secondary | ICD-10-CM | POA: Diagnosis not present

## 2020-06-01 DIAGNOSIS — N289 Disorder of kidney and ureter, unspecified: Secondary | ICD-10-CM | POA: Diagnosis not present

## 2020-06-01 DIAGNOSIS — E782 Mixed hyperlipidemia: Secondary | ICD-10-CM | POA: Diagnosis not present

## 2020-06-01 DIAGNOSIS — E1165 Type 2 diabetes mellitus with hyperglycemia: Secondary | ICD-10-CM | POA: Diagnosis not present

## 2020-06-01 LAB — POCT GLYCOSYLATED HEMOGLOBIN (HGB A1C): Hemoglobin A1C: 6.9 % — AB (ref 4.0–5.6)

## 2020-06-01 MED ORDER — CANAGLIFLOZIN 100 MG PO TABS: 100.0000 mg | ORAL_TABLET | Freq: Every day | ORAL | 0 refills | Status: DC

## 2020-06-01 NOTE — Progress Notes (Signed)
[facility] [place], [place] [address]  Internal MEDICINE  Office Visit Note  Patient Name: Tiffany Hawkins  [id number]  [id number]  Date of Service: 06/05/2020  Chief Complaint  Patient presents with  . Follow-up  . Cough  . Headache    HPI Patient is here for routine follow-up Struggling with anxiety today--scheduled to have sleep study tonight and grandson is having a procedure today--feeling overwhelmed DM-glucose levels have been well controlled, she has not been taking [place] as her insurance stopped providing coverage and she was unable to afford mediation Reviewed recent labs--sodium slightly elevated, creatine elevated, decreased GFR, abnormal lipid panel  Current Medication: Outpatient Encounter Medications as of 06/01/2020  Medication Sig  . amLODipine (NORVASC) 5 MG tablet Take 2 tablets (10 mg total) by mouth daily.  . Blood Glucose Calibration (ACCU-CHEK SMARTVIEW CONTROL VI) by In Vitro route. Use as di  . canagliflozin (INVOKANA) 100 MG TABS tablet Take 1 tablet (100 mg total) by mouth daily before breakfast.  . cetirizine (ZYRTEC) 10 MG chewable tablet Chew 1 tablet (10 mg total) by mouth daily.  . citalopram (CELEXA) 20 MG tablet TAKE 2 TABLETS DAILY FOR DEPRESSION.  . fenofibrate 54 MG tablet Take 1 tablet (54 mg total) by mouth daily.  . fluticasone (FLONASE) 50 MCG/ACT nasal spray Place 2 sprays into both nostrils daily.  Marland Kitchen glucose blood (ACCU-CHEK SMARTVIEW) test strip Blood sugar checks TID DX E11.65  . ibuprofen (ADVIL) 800 MG tablet Take 1 tablet (800 mg total) by mouth every 8 (eight) hours as needed.  . insulin aspart protamine - aspart (NOVOLOG MIX 70/30 FLEXPEN) (70-30) 100 UNIT/ML FlexPen Inject 0.35 mLs (35 Units total) into the skin 2 (two) times daily. Inject 35 units [place] QAM. Gradually increase to 40units [place] QPM  . lovastatin (MEVACOR) 20 MG tablet TAKE 1 TABLET AT BEDTIME FOR HIGH CHOLESTEROL  . metaxalone (SKELAXIN) 800  MG tablet Take 1 tablet (800 mg total) by mouth 2 (two) times daily as needed for muscle spasms.  . Multiple Vitamin (MULTIVITAMIN) tablet Take by mouth daily. TAKES 1/2 TABLET  . naproxen (NAPROSYN) 500 MG tablet Take 1 tablet (500 mg total) by mouth 2 (two) times daily as needed for moderate pain.  . pantoprazole (PROTONIX) 20 MG tablet Take 2 tablets (40 mg total) by mouth daily.  . sharps container 1 each by Does not apply route as needed. To use for insulin syringes and needles. Taking insulin at least twice daily.  E11.65  . [DISCONTINUED] benzonatate (TESSALON) 200 MG capsule Take 1 capsule (200 mg total) by mouth 2 (two) times daily as needed for cough.  . [DISCONTINUED] chlorpheniramine-HYDROcodone (TUSSIONEX PENNKINETIC ER) 10-8 MG/5ML SUER Take 5 mLs by mouth at bedtime as needed for cough.  . [DISCONTINUED] dapagliflozin propanediol (FARXIGA) 10 MG TABS tablet Take 1 tablet (10 mg total) by mouth daily.  . [DISCONTINUED] lisinopril (ZESTRIL) 5 MG tablet Take 1 tablet (5 mg total) by mouth daily.  . [DISCONTINUED] ondansetron (ZOFRAN ODT) 8 MG disintegrating tablet Take 1 tablet (8 mg total) by mouth 2 (two) times daily.  . [DISCONTINUED] ondansetron (ZOFRAN) 4 MG tablet Take 1 tablet (4 mg total) by mouth every 8 (eight) hours as needed for nausea or vomiting.  . [DISCONTINUED] Phenylephrine-APAP-guaiFENesin (Sebastian FAST-MAX) 10-650-400 MG/20ML LIQD Take by mouth.  . [DISCONTINUED] azithromycin (ZITHROMAX) 250 MG tablet z-pack - take as directed for 5 days (Patient not taking: Reported on 05/24/2020)   No facility-administered encounter medications on  file as of 06/01/2020.    Surgical History: Past Surgical History:  Procedure Laterality Date  . APPENDECTOMY    . BACK SURGERY    . CATARACT EXTRACTION W/PHACO Left 12/03/2017   Procedure: CATARACT EXTRACTION PHACO AND INTRAOCULAR LENS PLACEMENT ([place]) LEFT  DIABETIC;  Surgeon: Eulogio Bear, MD;  Location: [place];   Service: Ophthalmology;  Laterality: Left;  Diabetic - insulin  . CATARACT EXTRACTION W/PHACO Right 01/14/2018   Procedure: CATARACT EXTRACTION PHACO AND INTRAOCULAR LENS PLACEMENT ([place]) RIGHT DIABETIC;  Surgeon: Eulogio Bear, MD;  Location: [place];  Service: Ophthalmology;  Laterality: Right;  Diabetic - insulin  . CERVICAL DISC SURGERY    . CHOLECYSTECTOMY    . COLON SURGERY    . COLONOSCOPY    . COLONOSCOPY WITH PROPOFOL N/A 05/17/2015   Procedure: COLONOSCOPY WITH PROPOFOL;  Surgeon: Manya Silvas, MD;  [facility] ENDOSCOPY;  Service: Endoscopy;  Laterality: N/A;  . COLONOSCOPY WITH PROPOFOL N/A 04/01/2018   Procedure: COLONOSCOPY WITH BIOPSY;  Surgeon: [name], MD;  Location: [place];  Service: Endoscopy;  Laterality: N/A;  Diabetic - insulin  . ESOPHAGOGASTRODUODENOSCOPY    . HERNIA REPAIR    . POLYPECTOMY N/A 04/01/2018   Procedure: POLYPECTOMY;  Surgeon: [name], MD;  Location: [place];  Service: Endoscopy;  Laterality: N/A;  . PORT A CATH INJECTION ([place] [facility])     Port has been removed  . TUBAL LIGATION      Medical History: Past Medical History:  Diagnosis Date  . Anxiety   . Colon cancer ([place]) 10/12/2014   Stage IIB; had chemo  . Diabetes mellitus without complication ([place])   . Hyperlipidemia   . Hypertension   . Lateral epicondylitis   . Personal history of chemotherapy   . Polio    childhood  . Skin cancer    Sqamous Cell, Lt Calf  . Sleep apnea    no CPAP, sx improved with wt loss    Family History: Family History  Problem Relation Age of Onset  . Alzheimer's disease Mother   . Heart disease Father   . Colon cancer Cousin   . Colon cancer Cousin   . Cancer Cousin        Brain Tumor  . Breast cancer Neg Hx     Social History   Socioeconomic History  . Marital status: Married    Spouse name: Not on file  . Number of children: Not on file  . Years of education: Not on file  . Highest education  level: Not on file  Occupational History  . Not on file  Tobacco Use  . Smoking status: Former Smoker    Packs/day: 1.50    Years: 20.00    Pack years: 30.00    Types: Cigarettes    Quit date: 04/17/1981    Years since quitting: 39.1  . Smokeless tobacco: Never Used  Vaping Use  . Vaping Use: Never used  Substance and Sexual Activity  . Alcohol use: No  . Drug use: No  . Sexual activity: Never  Other Topics Concern  . Not on file  Social History Narrative  . Not on file   Social Determinants of Health   Financial Resource Strain: Not on file  Food Insecurity: Not on file  Transportation Needs: Not on file  Physical Activity: Not on file  Stress: Not on file  Social Connections: Not on file  Intimate Partner Violence: Not on file  Review of Systems  Constitutional: Positive for fatigue. Negative for chills and diaphoresis.  HENT: Negative for ear pain, postnasal drip and sinus pressure.   Eyes: Negative for photophobia, discharge, redness, itching and visual disturbance.  Respiratory: Negative for cough, shortness of breath and wheezing.   Cardiovascular: Negative for chest pain, palpitations and leg swelling.  Gastrointestinal: Negative for abdominal pain, constipation, diarrhea, nausea and vomiting.  Genitourinary: Negative for dysuria and flank pain.  Musculoskeletal: Negative for arthralgias, back pain, gait problem and neck pain.  Skin: Negative for color change.  Allergic/Immunologic: Negative for environmental allergies and food allergies.  Neurological: Negative for dizziness and headaches.  Hematological: Does not bruise/bleed easily.  Psychiatric/Behavioral: Positive for sleep disturbance. Negative for agitation, behavioral problems (depression) and hallucinations. The patient is nervous/anxious.     Vital Signs: BP 130/75   Pulse 94   Temp (!) 97.5 F (36.4 C)   Resp 16   Ht 5\' 2"  (1.575 m)   Wt 158 lb (71.7 kg)   SpO2 95%   BMI 28.90 kg/m     Physical Exam Vitals reviewed.  Constitutional:      Appearance: Normal appearance. She is normal weight.  Cardiovascular:     Rate and Rhythm: Normal rate and regular rhythm.     Pulses: Normal pulses.     Heart sounds: Normal heart sounds.  Pulmonary:     Effort: Pulmonary effort is normal.     Breath sounds: Normal breath sounds.  Abdominal:     General: Abdomen is flat.     Palpations: Abdomen is soft.  Musculoskeletal:        General: Normal range of motion.     Cervical back: Normal range of motion.  Skin:    General: Skin is warm.  Neurological:     General: No focal deficit present.     Mental Status: She is alert and oriented to person, place, and time. Mental status is at baseline.  Psychiatric:        Mood and Affect: Mood normal.        Behavior: Behavior normal.        Thought Content: Thought content normal.        Judgment: Judgment normal.    Assessment/Plan: 1. Uncontrolled type 2 diabetes mellitus with hyperglycemia (HCC) A1C 6.9 today, continue with 70/30 insulin and start Invokana--advised to contact office if Invokana is a financial burden - POCT HgB A1C - canagliflozin (INVOKANA) 100 MG TABS tablet; Take 1 tablet (100 mg total) by mouth daily before breakfast.  Dispense: 30 tablet; Refill: 0  2. OSA (obstructive sleep apnea) Scheduled for sleep study tonight, will follow-up with results  3. Mixed hyperlipidemia Continue statin as well as ASA therapy--will need further vascular studies  4. Abnormal kidney function Will repeat levels at next visit and consider renal studies  General Counseling: Rosalie Doctor understanding of the findings of todays visit and agrees with plan of treatment. I have discussed any further diagnostic evaluation that may be needed or ordered today. We also reviewed her medications today. she has been encouraged to call the office with any questions or concerns that should arise related to todays visit.    Orders  Placed This Encounter  Procedures  . POCT HgB A1C    Meds ordered this encounter  Medications  . canagliflozin (INVOKANA) 100 MG TABS tablet    Sig: Take 1 tablet (100 mg total) by mouth daily before breakfast.    Dispense:  30 tablet  Refill:  0    Time spent: 30 Minutes Time spent includes review of chart, medications, test results and follow-up plan with the patient.  This patient was seen by Theodoro Grist AGNP-C in Collaboration with Dr Lavera Guise as a part of collaborative care agreement     Tanna Furry. Dashauna Heymann AGNP-C Internal medicine

## 2020-06-02 NOTE — Procedures (Signed)
[facility] Report Part I  Phone: [phone] Fax: [phone]  Patient Name: Tiffany Hawkins, Krieger Acquisition Number: [id number]  Date of Birth: May 01, 1945 Acquisition Date: 06/01/2020  Referring Physician:      Theodoro Grist, NP     History: The patient is a 75 year old female with obstructive sleep apnea for CPAP titration. Medical History: anxiety, diabetesl ll, hypertesion, hyperlipidemia, lateral epicondylitis  Medications: Norvasc, Accu check smart view control V1,Tussionex, Pennkinetic, Celexa,Farxiga,Flonase, Advil,Novolog mlX70/30 flex pen, Zestril, Mevacor,Skelaxin,Multiple vtaman, Naprosyn,Zofran, Protonix, Mucinex FastMax  Procedure: This routine overnight polysomnogram was performed on the [place] 5 using the standard CPAP protocol. This included 6 channels of EEG, 2 channels of EOG, chin EMG, bilateral anterior tibialis EMG, nasal/oral thermistor, PTAF (nasal pressure transducer), chest and abdominal wall movements, EKG, and pulse oximetry.  Description: The total recording time was 365.0 minutes. The total sleep time was 243.5 minutes. There were a total of 76.2 minutes of wakefulness after sleep onset for a?reduced?sleep efficiency of 66.7%. The latency to sleep onset was prolonged at 45.3 minutes. The R sleep onset latency was N/A. ????? Sleep parameters, as a percentage of the total sleep time, demonstrated 4.3% of sleep was in N1 sleep, 88.9% N2, 6.8% N3 and 0.0% R sleep. There were a total of 12 arousals for an arousal index of 3.0 arousals per hour of sleep that was normal.???  Overall, there were a total of 166 respiratory events for a respiratory disturbance index, which includes apneas, hypopneas and RERAs (increased respiratory effort) of 40.9 respiratory events per hour of sleep during the pressure titration. CPAP was initiated at 5 cm H2O at lights out, 10:18 p.m. It was titrated in 1-2 cm increments for obstructive events  to the final pressure of 17 cm H2O. Obstructive events persisted at the final pressure.  Additionally, the baseline oxygen saturation during wakefulness was 95%, during NREM sleep averaged 94%, and during REM sleep averaged N/A.The total duration of oxygen < 90% was 28.9 minutes and <80% was 0.0 minutes.  Cardiac monitoring- There were no significant cardiac rhythm irregularities.   Periodic limb movement monitoring- did not demonstrate periodic limb movements.   Impression: This patient's obstructive sleep apnea demonstrated significant improvement with the utilization of nasal CPAP however obstructive events persisted at the final pressure of 17 cm H2O. The patient woke before further pressure changes could be made.      Recommendations: 1. Would recommend a repeat titration study in the supine position quickly titrating to 17 cm H2O. Once an optimum pressure is determined the patient should move to the lateral position and the pressure reduced to determine the least effective pressure.     2. A ResMed AirFit F30 medium mask was used. Chin strap used during study- no. Humidifier used during study- yes.     Allyne Gee, MD, [facility] Diplomate ABMS-Pulmonary, Critical Care and Sleep Medicine  Electronically reviewed and digitally signed     [place] CPAP/BIPAP Polysomnogram Report Part II Phone: [phone] Fax: [phone]  Patient last name Yim Neck Size 13.5 in. Acquisition [id number]  Patient first name Tiffany Hawkins Weight 155.0 lbs. Started 06/01/2020 at 10:13:38 PM  Birth date 02/25/1946 Height 61.0 in. Stopped 06/02/2020 at 4:26:50 AM  Age 75      Type Adult BMI 29.3 lb/in2 Duration 365.0  Report generated by: Adriana Mccallum, RPSGT Sleep Data: Lights Out: 10:18:20 PM Sleep  Onset: 11:03:38 PM  Lights On: 4:23:20 AM Sleep Efficiency: 66.7 %  Total Recording Time: 365.0 min Sleep Latency (from Lights Off) 45.3 min  Total Sleep Time (TST): 243.5 min R Latency (from  Sleep Onset): N/A  Sleep Period Time: 301.0 min Total number of awakenings: 20  Wake during sleep: 57.5 min Wake After Sleep Onset (WASO): 76.2 min   Sleep Data:         Arousal Summary: Stage  Latency from lights out (min) Latency from sleep onset (min) Duration (min) % Total Sleep Time  Normal values  N 1 45.3 0.0 10.5 4.3 (5%)  N 2 45.8 0.5 216.5 88.9 (50%)  N 3 272.3 227.0 16.5 6.8 (20%)  R N/A N/A 0.0 0.0 (25%)    Number Index  Spontaneous 56 13.8  Apneas & Hypopneas 120 29.6  RERAs 0 0.0       (Apneas & Hypopneas & RERAs)  (120) (29.6)  Limb Movement 0 0.0  Snore 0 0.0  TOTAL 176 43.4      Respiratory Data:  CA OA MA Apnea Hypopnea* A+ H RERA Total  Number 0 103 0 103 63 166 0 166  Mean Dur (sec) 0.0 29.4 0.0 29.4 41.8 34.1 0.0 34.1  Max Dur (sec) 0.0 48.5 0.0 48.5 68.5 68.5 0.0 68.5  Total Dur (min) 0.0 50.5 0.0 50.5 43.9 94.4 0.0 94.4  % of TST 0.0 20.7 0.0 20.7 18.0 38.8 0.0 38.8  Index (#/h TST) 0.0 25.4 0.0 25.4 15.5 40.9 0.0 40.9  *Hypopneas scored based on 4% or greater desaturation.  Sleep Stage:         REM NREM TST  AHI N/A 40.7 40.9  RDI N/A 40.7 40.9    Sleep (min) TST (%) REM (min) NREM (min) CA (#) OA (#) MA (#) HYP (#) AHI (#/h) RERA (#) RDI (#/h) Desat (#)  Supine 243.5 100.00 0.0 243.5 0 103 0 63 40.9 0 40.9 200  Non-Supine 0.00 0.00 0.00 0.00 0.00 0.00 0.00 0.00 0.00 0 0.00 0.00     Snoring: Total number of snoring episodes  0  Total time with snoring    min (   % of sleep)   Oximetry Distribution:             WK REM NREM TOTAL  Average (%)   95    94 94  < 90% 2.2 0.0 26.7 28.9  < 80% 0.0 0.0 0.0 0.0  < 70% 0.0 0.0 0.0 0.0  # of Desaturations* 7 0 194 201  Desat Index (#/hour) 3.5    47.8 49.5  Desat Max (%) 6 0 18 18  Desat Max Dur (sec) 90.0 0.0 110.0 110.0  Approx Min O2 during sleep 79  Approx min O2 during a respiratory event 79  Was Oxygen added (Y/N) and final rate No:   0 LPM  *Desaturations based on 3% or  greater drop from baseline.   Cheyne Stokes Breathing: None Present    Heart Rate Summary:  Average Heart Rate During Sleep 84.1 bpm      Highest Heart Rate During Sleep (95th %) 89.0 bpm      Highest Heart Rate During Sleep 105 bpm      Highest Heart Rate During Recording (TIB) 185 bpm (artifact)   Heart Rate Observations: Event Type # Events   Bradycardia 0 Lowest HR Scored: N/A  Sinus Tachycardia During Sleep 0 Highest HR Scored: N/A  Narrow Complex Tachycardia 0 Highest HR Scored:  N/A  Wide Complex Tachycardia 0 Highest HR Scored: N/A  Asystole 0 Longest Pause: N/A  Atrial Fibrillation 0 Duration Longest Event: N/A  Other Arrythmias  No Type:   Periodic Limb Movement Data: (Primary legs unless otherwise noted) Total # Limb Movement 0 Limb Movement Index 0.0  Total # PLMS    PLMS Index     Total # PLMS Arousals    PLMS Arousal Index     Percentage Sleep Time with PLMS   min (   % sleep)  Mean Duration limb movements (secs)       IPAP Level (cmH2O) EPAP Level (cmH2O) Total Duration (min) Sleep Duration (min) Sleep (%) REM (%) CA  #) OA # MA # HYP #) AHI (#/hr) RERAs # RERAs (#/hr) RDI (#/hr)  5 5 7.7 5.2 67.5 0.0 0 4 0 2 69.2 0 0.0 69.2  7 7  34.1 28.6 83.9 0.0 0 27 0 4 65.0 0 0.0 65.0  9 9 45.4 32.9 72.5 0.0 0 18 0 16 62.0 0 0.0 62.0  10 10 28.3 28.3 100.0 0.0 0 25 0 1 55.1 0 0.0 55.1  12 12  28.2 15.2 53.9 0.0 0 9 0 3 47.4 0 0.0 47.4  14 14  46.4 33.4 72.0 0.0 0 15 0 11 46.7 0 0.0 46.7  16 16  62.8 62.8 100.0 0.0 0 4 0 14 17.2 0 0.0 17.2  17 17  46.2 35.2 76.2 0.0 0 1 0 11 20.5 0 0.0 20.5

## 2020-06-03 NOTE — Progress Notes (Signed)
This patient is seeing you next.

## 2020-06-05 ENCOUNTER — Encounter: Payer: Self-pay | Admitting: Hospice and Palliative Medicine

## 2020-06-07 ENCOUNTER — Other Ambulatory Visit: Payer: Self-pay | Admitting: Nurse Practitioner

## 2020-06-07 DIAGNOSIS — I10 Essential (primary) hypertension: Secondary | ICD-10-CM

## 2020-06-08 ENCOUNTER — Encounter (INDEPENDENT_AMBULATORY_CARE_PROVIDER_SITE_OTHER): Payer: Medicare HMO | Admitting: Internal Medicine

## 2020-06-08 DIAGNOSIS — G4733 Obstructive sleep apnea (adult) (pediatric): Secondary | ICD-10-CM | POA: Diagnosis not present

## 2020-06-09 NOTE — Procedures (Signed)
[place] Report Part I  Phone: [phone] Fax: [phone]  Patient Name: Tiffany Hawkins, Tiffany Hawkins Acquisition Number: [id number]  Date of Birth: 07-17-1945 Acquisition Date: 06/08/2020  Referring Physician: Theodoro Grist, NP     History: The patient is a 75 year old female with obstructive sleep apnea for re-titration of CPAP. Medical History: sleep apnea, anxiety, diabetes2, hypertension, hyperlipidemia, lateral epicondylitis.  Medications: Norvasc, Accu check smart view control V1, Tussionex, Pennkinetic, Celexa, Farxiga, Flonase, advil, Novolog mx170/30 flex pen , Zestril, Mevacor, Skelaxim, multiple vitamins, Naprosyn, Zofran, Protonix, Mussinex Fast Max.   Procedure: This routine overnight polysomnogram was performed on the [facility] 5 using the standard CPAP protocol. This included 6 channels of EEG, 2 channels of EOG, chin EMG, bilateral anterior tibialis EMG, nasal/oral thermistor, PTAF (nasal pressure transducer), chest and abdominal wall movements, EKG, and pulse oximetry.  Description: The total recording time was 466.4 minutes. The total sleep time was 344.5 minutes. There were a total of 90.0 minutes of wakefulness after sleep onset for a reduced?sleep efficiency of 73.9%. The latency to sleep onset was?slightly prolonged? at 31.9 minutes. The R sleep onset latency was prolonged at 151.5 minutes. Sleep parameters, as a percentage of the total sleep time, demonstrated 7.7% of sleep was in N1 sleep, 74.6% N2, 1.6% N3 and 16.1% R sleep. There were a total of 27 arousals for an arousal index of 4.7 arousals per hour of sleep that was normal.???  Overall, there were a total of 75 respiratory events for a respiratory disturbance index, which includes apneas, hypopneas and RERAs (increased respiratory effort) of 13.1 respiratory events per hour of sleep during the pressure titration. CPAP was initiated at 5 cm H2O at lights out, 10:01 p.m.. It  was titrated in 1-2 cm increments for supine events to 17 cm H2O.  The pressure was reduced back to 11 cm H2O when the patient moved to the lateral position. The apnea was well controlled in the lateral position at pressures as low as 9 cm H2O while 17 cm h2O was required in the supine position.   Additionally, the baseline oxygen saturation during wakefulness was 94%, during NREM sleep averaged 94%, and during REM sleep averaged 95%. The total duration of oxygen < 90% was 20.5 minutes and <80% was 0.0 minutes.  Cardiac monitoring- There were no significant cardiac rhythm irregularities.   Periodic limb movement monitoring- demonstrated that there were 392 periodic limb movements for a periodic limb movement index of 68.3 periodic limb movements per hour of sleep. Quasi-periodic limb movements were observed during periods of wakefulness.  Impression: This patient's obstructive sleep apnea demonstrated significant improvement with the utilization of nasal CPAP at  17cm H2O in the supine position, however lower pressures were effective in the lateral position.   There was a highly elevated periodic limb movement index of 68.3 periodic limb movements per hour of sleep. In addition, quasi-periodic limb movements were observed during periods of wakefulness.  These limb movements were also observed during the initial polysomnogram. Treatment may be indicated if sleep disruption or sleepiness persist once the patient is fully compliant with CPAP.   Recommendations: 1. Would recommend utilization of auto-adjustingCPAP at 9-17 cm H2O.      2. Avoiding the supine sleep position may help minimize pressure requirements. 3. A [employer] Simplus small mask was used. Chin strap used during study- no. Humidifier used during study- yes.  Allyne Gee, MD, [facility] Diplomate ABMS-Pulmonary, Critical Care and Sleep Medicine  Electronically reviewed and digitally signed       [place] CPAP/BIPAP Polysomnogram Report Part II Phone: [phone] Fax: [phone]  Patient last name [name] Neck Size 13.5 in. Acquisition [id number]  Patient first name Tiffany Hawkins Weight 155.0 lbs. Started 06/08/2020 at 9:57:41 PM  Birth date [birth year] Height 61.0 in. Stopped 06/09/2020 at 5:52:05 AM  Age 39      Type Adult BMI 29.3 lb/in2 Duration 466.4  Report generated by: Adriana Mccallum, RPSGT Sleep Data: Lights Out: 10:01:47 PM Sleep Onset: 10:33:41 PM  Lights On: 5:48:11 AM Sleep Efficiency: 73.9 %  Total Recording Time: 466.4 min Sleep Latency (from Lights Off) 31.9 min  Total Sleep Time (TST): 344.5 min R Latency (from Sleep Onset): 151.5 min  Sleep Period Time: 433.5 min Total number of awakenings: 37  Wake during sleep: 89.0 min Wake After Sleep Onset (WASO): 90.0 min   Sleep Data:         Arousal Summary: Stage  Latency from lights out (min) Latency from sleep onset (min) Duration (min) % Total Sleep Time  Normal values  N 1 31.9 0.0 26.5 7.7 (5%)  N 2 32.4 0.5 257.0 74.6 (50%)  N 3 328.9 297.0 5.5 1.6 (20%)  R 183.4 151.5 55.5 16.1 (25%)    Number Index  Spontaneous 59 10.3  Apneas & Hypopneas 39 6.8  RERAs 0 0.0       (Apneas & Hypopneas & RERAs)  (39) (6.8)  Limb Movement 39 6.8  Snore 0 0.0  TOTAL 137 23.9      Respiratory Data:  CA OA MA Apnea Hypopnea* A+ H RERA Total  Number 0 32 0 32 43 75 0 75  Mean Dur (sec) 0.0 24.6 0.0 24.6 40.0 33.4 0.0 33.4  Max Dur (sec) 0.0 36.0 0.0 36.0 68.0 68.0 0.0 68.0  Total Dur (min) 0.0 13.1 0.0 13.1 28.6 41.8 0.0 41.8  % of TST 0.0 3.8 0.0 3.8 8.3 12.1 0.0 12.1  Index (#/h TST) 0.0 5.6 0.0 5.6 7.5 13.1 0.0 13.1  *Hypopneas scored based on 4% or greater desaturation.  Sleep Stage:         REM NREM TST  AHI 1.1 14.5 13.1  RDI 1.1 14.5 13.1    Sleep (min) TST (%) REM (min) NREM (min) CA (#) OA (#) MA (#) HYP (#) AHI (#/h) RERA (#) RDI (#/h) Desat (#)  Supine 152.8 44.35 3.1 149.7 0 27 0 42 27.1 0  27.1 92  Non-Supine 191.70 55.65 52.40 139.30 0.00 5.00 0.00 1.00 1.88 0 1.88 12.00  Left: 17.8 5.17 0.0 17.8 0 3 0 1 13.5 0 13.5 9  Prone: 0.0 0.00 0.0 0.0 0 0 0 0 0.0 0 0.00 0  Right: 173.9 50.48 52.4 121.5 0 2 0 0 0.7 0 0.7 3     Snoring: Total number of snoring episodes  0  Total time with snoring    min (   % of sleep)   Oximetry Distribution:             WK REM NREM TOTAL  Average (%)   94 95 94 94  < 90% 7.3 0.0 13.2 20.5  < 80% 0.0 0.0 0.0 0.0  < 70% 0.0 0.0 0.0 0.0  # of Desaturations* 6 1 97 104  Desat Index (#/hour) 3.0 1.1 20.2 18.1  Desat Max (%) 12 4 17 17   Desat  Max Dur (sec) 55.0 11.0 117.0 117.0  Approx Min O2 during sleep 80  Approx min O2 during a respiratory event 80  Was Oxygen added (Y/N) and final rate No:   0 LPM  *Desaturations based on 3% or greater drop from baseline.   Cheyne Stokes Breathing: None Present   Heart Rate Summary:  Average Heart Rate During Sleep 85.4 bpm      Highest Heart Rate During Sleep (95th %) 90.0 bpm      Highest Heart Rate During Sleep 187 bpm (artifact)  Highest Heart Rate During Recording (TIB) 202 bpm (artifact)   Heart Rate Observations: Event Type # Events   Bradycardia 0 Lowest HR Scored: N/A  Sinus Tachycardia During Sleep 0 Highest HR Scored: N/A  Narrow Complex Tachycardia 0 Highest HR Scored: N/A  Wide Complex Tachycardia 0 Highest HR Scored: N/A  Asystole 0 Longest Pause: N/A  Atrial Fibrillation 0 Duration Longest Event: N/A  Other Arrythmias  No Type:   Periodic Limb Movement Data: (Primary legs unless otherwise noted) Total # Limb Movement 411 Limb Movement Index 71.6  Total # PLMS 392 PLMS Index 68.3  Total # PLMS Arousals 30 PLMS Arousal Index 5.2  Percentage Sleep Time with PLMS 153.16min (44.6 % sleep)  Mean Duration limb movements (secs) 709.5    IPAP Level (cmH2O) EPAP Level (cmH2O) Total Duration (min) Sleep Duration (min) Sleep (%) REM (%) CA  #) OA # MA # HYP #) AHI (#/hr) RERAs # RERAs  (#/hr) RDI (#/hr)  7 7 47.2 36.5 77.3 0.0 0 3 0 14 27.9 0 0.0 27.9  9 9  57.6 40.6 70.5 0.0 0 13 0 3 23.6 0 0.0 23.6  10 10  99.6 83.1 83.4 21.6 0 9 0 1 7.2 0 0.0 7.2  11 11  45.8 45.3 98.9 69.9 0 1 0 0 1.3 0 0.0 1.3  12 12  37.9 26.4 69.7 0.0 0 5 0 6 25.0 0 0.0 25.0  13 13 45.6 33.4 73.2 0.0 0 0 0 0 0.0 0 0.0 0.0  14 14 30.5 26.0 85.2 0.0 0 0 0 12 27.7 0 0.0 27.7  15 15  8.2 0.0 0.0 0.0 0 0 0 0 0.0 0 0.0 0.0  16 16 42.9 42.4 98.8 0.0 0 0 0 5 7.1 0 0.0 7.1  17 17  15.3 8.9 58.2 13.1 0 0 0 2 13.5 0 0.0 13.5

## 2020-06-22 ENCOUNTER — Ambulatory Visit: Payer: Medicare HMO | Admitting: Internal Medicine

## 2020-06-22 ENCOUNTER — Encounter: Payer: Self-pay | Admitting: Internal Medicine

## 2020-06-22 VITALS — BP 134/72 | HR 102 | Temp 98.4°F | Resp 16 | Ht 61.5 in | Wt 156.6 lb

## 2020-06-22 DIAGNOSIS — R5383 Other fatigue: Secondary | ICD-10-CM | POA: Diagnosis not present

## 2020-06-22 DIAGNOSIS — G4733 Obstructive sleep apnea (adult) (pediatric): Secondary | ICD-10-CM | POA: Diagnosis not present

## 2020-06-22 DIAGNOSIS — G2581 Restless legs syndrome: Secondary | ICD-10-CM

## 2020-06-22 NOTE — Progress Notes (Signed)
[facility] [address], [place] [address]  Pulmonary Sleep Medicine   Office Visit Note  Patient Name: Tiffany Hawkins DOB: 1946/01/19 MRN [id number]  Date of Service: 06/22/2020  Complaints/HPI: OSA  She had her on titration study showed she requires a pressure of 17 while she is in the supine position and lower pressure while she is on the lateral position.  Spoke to her about putting her on auto titrating device.  She already has a DME provider that she deals with an she would be happy to go back to them.  ROS  General: (-) fever, (-) chills, (-) night sweats, (-) weakness Skin: (-) rashes, (-) itching,. Eyes: (-) visual changes, (-) redness, (-) itching. Nose and Sinuses: (-) nasal stuffiness or itchiness, (-) postnasal drip, (-) nosebleeds, (-) sinus trouble. Mouth and Throat: (-) sore throat, (-) hoarseness. Neck: (-) swollen glands, (-) enlarged thyroid, (-) neck pain. Respiratory: - cough, (-) bloody sputum, - shortness of breath, - wheezing. Cardiovascular: - ankle swelling, (-) chest pain. Lymphatic: (-) lymph node enlargement. Neurologic: (-) numbness, (-) tingling. Psychiatric: (-) anxiety, (-) depression   Current Medication: Outpatient Encounter Medications as of 06/22/2020  Medication Sig  . amLODipine (NORVASC) 5 MG tablet TAKE 2 TABLETS BY MOUTH DAILY.  Marland Kitchen Blood Glucose Calibration (ACCU-CHEK SMARTVIEW CONTROL VI) by In Vitro route. Use as di  . canagliflozin (INVOKANA) 100 MG TABS tablet Take 1 tablet (100 mg total) by mouth daily before breakfast.  . cetirizine (ZYRTEC) 10 MG chewable tablet Chew 1 tablet (10 mg total) by mouth daily.  . citalopram (CELEXA) 20 MG tablet TAKE 2 TABLETS DAILY FOR DEPRESSION.  . fenofibrate 54 MG tablet Take 1 tablet (54 mg total) by mouth daily.  . fluticasone (FLONASE) 50 MCG/ACT nasal spray Place 2 sprays into both nostrils daily.  Marland Kitchen glucose blood (ACCU-CHEK SMARTVIEW) test strip Blood sugar checks TID DX  E11.65  . ibuprofen (ADVIL) 800 MG tablet Take 1 tablet (800 mg total) by mouth every 8 (eight) hours as needed.  . insulin aspart protamine - aspart (NOVOLOG MIX 70/30 FLEXPEN) (70-30) 100 UNIT/ML FlexPen Inject 0.35 mLs (35 Units total) into the skin 2 (two) times daily. Inject 35 units [place] QAM. Gradually increase to 40units [place] QPM  . lovastatin (MEVACOR) 20 MG tablet TAKE 1 TABLET AT BEDTIME FOR HIGH CHOLESTEROL  . metaxalone (SKELAXIN) 800 MG tablet Take 1 tablet (800 mg total) by mouth 2 (two) times daily as needed for muscle spasms.  . Multiple Vitamin (MULTIVITAMIN) tablet Take by mouth daily. TAKES 1/2 TABLET  . naproxen (NAPROSYN) 500 MG tablet Take 1 tablet (500 mg total) by mouth 2 (two) times daily as needed for moderate pain.  . pantoprazole (PROTONIX) 20 MG tablet Take 2 tablets (40 mg total) by mouth daily.  . sharps container 1 each by Does not apply route as needed. To use for insulin syringes and needles. Taking insulin at least twice daily.  E11.65   No facility-administered encounter medications on file as of 06/22/2020.    Surgical History: Past Surgical History:  Procedure Laterality Date  . APPENDECTOMY    . BACK SURGERY    . CATARACT EXTRACTION W/PHACO Left 12/03/2017   Procedure: CATARACT EXTRACTION PHACO AND INTRAOCULAR LENS PLACEMENT ([place]) LEFT  DIABETIC;  Surgeon: Eulogio Bear, MD;  Location: [facility];  Service: Ophthalmology;  Laterality: Left;  Diabetic - insulin  . CATARACT EXTRACTION W/PHACO Right 01/14/2018   Procedure: CATARACT EXTRACTION PHACO AND  INTRAOCULAR LENS PLACEMENT ([facility]) RIGHT DIABETIC;  Surgeon: Eulogio Bear, MD;  Location: [facility];  Service: Ophthalmology;  Laterality: Right;  Diabetic - insulin  . CERVICAL DISC SURGERY    . CHOLECYSTECTOMY    . COLON SURGERY    . COLONOSCOPY    . COLONOSCOPY WITH PROPOFOL N/A 05/17/2015   Procedure: COLONOSCOPY WITH PROPOFOL;  Surgeon: Manya Silvas, MD;  Location: [facility]  ENDOSCOPY;  Service: Endoscopy;  Laterality: N/A;  . COLONOSCOPY WITH PROPOFOL N/A 04/01/2018   Procedure: COLONOSCOPY WITH BIOPSY;  Surgeon: [name], MD;  Location: [facility];  Service: Endoscopy;  Laterality: N/A;  Diabetic - insulin  . ESOPHAGOGASTRODUODENOSCOPY    . HERNIA REPAIR    . POLYPECTOMY N/A 04/01/2018   Procedure: POLYPECTOMY;  Surgeon: [name], MD;  Location: [facility];  Service: Endoscopy;  Laterality: N/A;  . PORT A CATH INJECTION ([place] HX)     Port has been removed  . TUBAL LIGATION      Medical History: Past Medical History:  Diagnosis Date  . Anxiety   . Colon cancer ([place]) 10/12/2014   Stage IIB; had chemo  . Diabetes mellitus without complication ([place])   . Hyperlipidemia   . Hypertension   . Lateral epicondylitis   . Personal history of chemotherapy   . Polio    childhood  . Skin cancer    Sqamous Cell, Lt Calf  . Sleep apnea    no CPAP, sx improved with wt loss    Family History: Family History  Problem Relation Age of Onset  . Alzheimer's disease Mother   . Heart disease Father   . Colon cancer Cousin   . Colon cancer Cousin   . Cancer Cousin        Brain Tumor  . Breast cancer Neg Hx     Social History: Social History   Socioeconomic History  . Marital status: Married    Spouse name: Not on file  . Number of children: Not on file  . Years of education: Not on file  . Highest education level: Not on file  Occupational History  . Not on file  Tobacco Use  . Smoking status: Former Smoker    Packs/day: 1.50    Years: 20.00    Pack years: 30.00    Types: Cigarettes    Quit date: 04/17/1981    Years since quitting: 39.2  . Smokeless tobacco: Never Used  Vaping Use  . Vaping Use: Never used  Substance and Sexual Activity  . Alcohol use: No  . Drug use: No  . Sexual activity: Never  Other Topics Concern  . Not on file  Social History Narrative  . Not on file   Social Determinants of Health    Financial Resource Strain: Not on file  Food Insecurity: Not on file  Transportation Needs: Not on file  Physical Activity: Not on file  Stress: Not on file  Social Connections: Not on file  Intimate Partner Violence: Not on file    Vital Signs: Blood pressure 134/72, pulse (!) 102, temperature 98.4 F (36.9 C), resp. rate 16, height 5' 1.5" (1.562 m), weight 156 lb 9.6 oz (71 kg), SpO2 93 %.  Examination: General Appearance: The patient is well-developed, well-nourished, and in no distress. Skin: Gross inspection of skin unremarkable. Head: normocephalic, no gross deformities. Eyes: no gross deformities noted. ENT: ears appear grossly normal no exudates. Neck: Supple. No thyromegaly. No LAD. Respiratory: no rhonchi. Cardiovascular: Normal S1  and S2 without murmur or rub. Extremities: No cyanosis. pulses are equal. Neurologic: Alert and oriented. No involuntary movements.  LABS: Recent Results (from the past 2160 hour(s))  Comprehensive metabolic panel     Status: Abnormal   Collection Time: 05/25/20  8:54 AM  Result Value Ref Range   Glucose 159 (H) 65 - 99 mg/dL   BUN 17 8 - 27 mg/dL   Creatinine, Ser 1.11 (H) 0.57 - 1.00 mg/dL   GFR calc non Af Amer 49 (L) >59 mL/min/1.73   GFR calc Af Amer 56 (L) >59 mL/min/1.73    Comment: **In accordance with recommendations from the NKF-ASN Task force,**   [facility] is in the process of updating its eGFR calculation to the   2021 CKD-EPI creatinine equation that estimates kidney function   without a race variable.    BUN/Creatinine Ratio 15 12 - 28   Sodium 145 (H) 134 - 144 mmol/L   Potassium 4.5 3.5 - 5.2 mmol/L   Chloride 103 96 - 106 mmol/L   CO2 21 20 - 29 mmol/L   Calcium 10.0 8.7 - 10.3 mg/dL   Total Protein 7.3 6.0 - 8.5 g/dL   Albumin 4.7 3.7 - 4.7 g/dL   Globulin, Total 2.6 1.5 - 4.5 g/dL   Albumin/Globulin Ratio 1.8 1.2 - 2.2   Bilirubin Total 0.2 0.0 - 1.2 mg/dL   Alkaline Phosphatase 51 44 - 121 IU/L   AST 23  0 - 40 IU/L   ALT 28 0 - 32 IU/L  CBC     Status: Abnormal   Collection Time: 05/25/20  8:54 AM  Result Value Ref Range   WBC 7.4 3.4 - 10.8 x10E3/uL   RBC 5.56 (H) 3.77 - 5.28 x10E6/uL   Hemoglobin 15.7 11.1 - 15.9 g/dL   Hematocrit 47.3 (H) 34.0 - 46.6 %   MCV 85 79 - 97 fL   MCH 28.2 26.6 - 33.0 pg   MCHC 33.2 31.5 - 35.7 g/dL   RDW 13.0 11.7 - 15.4 %   Platelets 220 150 - 450 x10E3/uL  Lipid Panel With LDL/HDL Ratio     Status: Abnormal   Collection Time: 05/25/20  8:54 AM  Result Value Ref Range   Cholesterol, Total 168 100 - 199 mg/dL   Triglycerides 257 (H) 0 - 149 mg/dL   HDL 35 (L) >39 mg/dL   VLDL Cholesterol Cal 43 (H) 5 - 40 mg/dL   LDL Chol Calc (NIH) 90 0 - 99 mg/dL   LDL/HDL Ratio 2.6 0.0 - 3.2 ratio    Comment:                                     LDL/HDL Ratio                                             Men  Women                               1/2 Avg.Risk  1.0    1.5                                   Avg.Risk  3.6    3.2                                2X Avg.Risk  6.2    5.0                                3X Avg.Risk  8.0    6.1   T4, free     Status: None   Collection Time: 05/25/20  8:54 AM  Result Value Ref Range   Free T4 1.04 0.82 - 1.77 ng/dL  TSH     Status: None   Collection Time: 05/25/20  8:54 AM  Result Value Ref Range   TSH 3.010 0.450 - 4.500 uIU/mL  VITAMIN D 25 Hydroxy (Vit-D Deficiency, Fractures)     Status: None   Collection Time: 05/25/20  8:54 AM  Result Value Ref Range   Vit D, 25-Hydroxy 30.3 30.0 - 100.0 ng/mL    Comment: Vitamin D deficiency has been defined by the [place] and an Endocrine Society practice guideline as a level of serum 25-OH vitamin D less than 20 ng/mL (1,2). The Endocrine Society went on to further define vitamin D insufficiency as a level between 21 and 29 ng/mL (2). 1. IOM (Institute of Medicine). 2010. Dietary reference    intakes for calcium and D. [place]: The    [employer]. 2. Holick MF, Binkley Jarrell, Bischoff-Ferrari HA, et al.    Evaluation, treatment, and prevention of vitamin D    deficiency: an Endocrine Society clinical practice    guideline. JCEM. 2011 Jul; 96(7):1911-30.   POCT HgB A1C     Status: Abnormal   Collection Time: 06/01/20 11:43 AM  Result Value Ref Range   Hemoglobin A1C 6.9 (A) 4.0 - 5.6 %   HbA1c POC (<> result, manual entry)     HbA1c, POC (prediabetic range)     HbA1c, POC (controlled diabetic range)      Radiology: DG Chest 2 View  Result Date: 08/29/2019 CLINICAL DATA:  Productive cough. EXAM: CHEST - 2 VIEW COMPARISON:  July 21, 2016. FINDINGS: The heart size and mediastinal contours are within normal limits. Both lungs are clear. The visualized skeletal structures are unremarkable. IMPRESSION: No active cardiopulmonary disease. Electronically Signed   By: Marijo Conception M.D.   On: 08/29/2019 08:27    No results found.  No results found.    Assessment and Plan: Patient Active Problem List   Diagnosis Date Noted  . Deformity of toe of left foot 03/28/2020  . H/O multiple pulmonary nodules 09/22/2019  . Chronic cough 09/22/2019  . Perennial allergic rhinitis 09/17/2019  . Acute upper respiratory infection 08/28/2019  . Weakness 08/28/2019  . Acute bronchitis 06/01/2019  . Cough productive of yellow sputum 06/01/2019  . 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 Chronic kidney disease, stage II (mild) 03/14/2019  . Gastroesophageal reflux disease without esophagitis 03/14/2019  . Intractable episodic headache 03/14/2019  . Need for prophylactic vaccination with combined diphtheria-tetanus-pertussis (DTaP) vaccine 03/14/2019  . Renal cyst 11/27/2018  . Abnormality of gait and mobility 11/27/2018  . Hepatic steatosis 10/31/2018  . 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 Elevated ferritin 10/31/2018  . Vertigo 08/13/2018  . Gastroenteritis 05/09/2018  . Diarrhea 05/09/2018  . Nausea 05/09/2018  .  Personal history of colon cancer   .  Polyp of sigmoid colon   . Dysuria 02/23/2018  . 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 Encounter for general adult medical examination with abnormal findings 02/10/2018  . Acute non-recurrent pansinusitis 08/24/2017  . Other headache syndrome 08/24/2017  . Uncontrolled type 2 diabetes mellitus with hyperglycemia ([place]) 08/24/2017  . Type 2 diabetes mellitus without complication, with long-term current use of insulin ([place]) 08/22/2017  . Need for vaccination against Streptococcus pneumoniae using pneumococcal conjugate vaccine 13 08/22/2017  . Essential hypertension 05/03/2017  . Otalgia, bilateral 05/02/2017  . Type 2 diabetes mellitus with hyperglycemia ([place]) 05/02/2017  . Mixed hyperlipidemia 05/02/2017  . Right lower quadrant pain 06/29/2016  . 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 History of colon cancer 04/13/2015  . Cancer of ascending colon ([place]) 10/12/2014    1. OSA (obstructive sleep apnea)   Will be started on auto titration device because of the difference in pressure requirements between supine and lateral position - For home use only DME continuous positive airway pressure (CPAP)  2. Obesity, morbid ([place]) Obesity Counseling: Had a lengthy discussion regarding patients BMI and weight issues. Patient was instructed on portion control as well as increased activity. Also discussed caloric restrictions with trying to maintain intake less than 2000 Kcal. Discussions were made in accordance with the 5As of weight management. Simple actions such as not eating late and if able to, taking a walk is suggested.   3. RLS (restless legs syndrome)  - Fe+TIBC+Fer  4. Other fatigue  ordered  Iron studies for further workup.  I did review her labs they looked okay she has not had any iron studies done however.  She does have severe symptoms - Fe+TIBC+Fer   General Counseling: I have  discussed the findings of the evaluation and examination with Ivin Booty.  I have also discussed any further diagnostic evaluation thatmay be needed or ordered today. Franchesca verbalizes understanding of the findings of todays visit. We also reviewed her medications today and discussed drug interactions and side effects including but not limited excessive drowsiness and altered mental states. We also discussed that there is always a risk not just to her but also people around her. she has been encouraged to call the office with any questions or concerns that should arise related to todays visit.  No orders of the defined types were placed in this encounter.    Time spent: 38  I have personally obtained a history, examined the patient, evaluated laboratory and imaging results, formulated the assessment and plan and placed orders.    Allyne Gee, MD [facility] Pulmonary and Critical Care Sleep medicine

## 2020-06-22 NOTE — Patient Instructions (Signed)

## 2020-06-23 ENCOUNTER — Telehealth: Payer: Self-pay

## 2020-06-23 NOTE — Telephone Encounter (Signed)
Gave a [facility] orders for RX for new cpap set up.Stori Royse

## 2020-06-25 ENCOUNTER — Other Ambulatory Visit: Payer: Self-pay

## 2020-06-28 ENCOUNTER — Other Ambulatory Visit: Payer: Self-pay

## 2020-06-28 MED ORDER — TRUE METRIX BLOOD GLUCOSE TEST VI STRP: 1.0000 | ORAL_STRIP | Freq: Three times a day (TID) | 1 refills | Status: DC

## 2020-06-28 MED ORDER — TRUEPLUS LANCETS 33G MISC: 3 refills | Status: DC

## 2020-06-28 MED ORDER — GNP TRUE METRIX GLUCOSE METER W/DEVICE KIT: 1.0000 | PACK | Freq: Every day | 0 refills | Status: DC

## 2020-07-05 ENCOUNTER — Other Ambulatory Visit: Payer: Self-pay | Admitting: Nurse Practitioner

## 2020-07-05 DIAGNOSIS — K219 Gastro-esophageal reflux disease without esophagitis: Secondary | ICD-10-CM

## 2020-07-07 ENCOUNTER — Ambulatory Visit: Payer: Medicare HMO

## 2020-07-08 ENCOUNTER — Ambulatory Visit: Payer: Medicare HMO | Admitting: Internal Medicine

## 2020-07-08 ENCOUNTER — Ambulatory Visit
Admission: RE | Admit: 2020-07-08 | Discharge: 2020-07-08 | Disposition: A | Discharge status: Home / Self Care | Payer: Medicare HMO | Source: Ambulatory Visit | Attending: Physician Assistant | Admitting: Physician Assistant

## 2020-07-08 ENCOUNTER — Encounter: Payer: Self-pay | Admitting: Physician Assistant

## 2020-07-08 ENCOUNTER — Other Ambulatory Visit: Payer: Self-pay | Admitting: Hospice and Palliative Medicine

## 2020-07-08 ENCOUNTER — Ambulatory Visit (INDEPENDENT_AMBULATORY_CARE_PROVIDER_SITE_OTHER): Payer: Medicare HMO | Admitting: Physician Assistant

## 2020-07-08 ENCOUNTER — Other Ambulatory Visit: Payer: Self-pay

## 2020-07-08 DIAGNOSIS — R053 Chronic cough: Secondary | ICD-10-CM | POA: Insufficient documentation

## 2020-07-08 DIAGNOSIS — K219 Gastro-esophageal reflux disease without esophagitis: Secondary | ICD-10-CM

## 2020-07-08 DIAGNOSIS — G4733 Obstructive sleep apnea (adult) (pediatric): Secondary | ICD-10-CM

## 2020-07-08 DIAGNOSIS — J3089 Other allergic rhinitis: Secondary | ICD-10-CM | POA: Diagnosis not present

## 2020-07-08 DIAGNOSIS — E1165 Type 2 diabetes mellitus with hyperglycemia: Secondary | ICD-10-CM

## 2020-07-08 DIAGNOSIS — R0789 Other chest pain: Secondary | ICD-10-CM | POA: Diagnosis not present

## 2020-07-08 DIAGNOSIS — R059 Cough, unspecified: Secondary | ICD-10-CM | POA: Diagnosis not present

## 2020-07-08 MED ORDER — ALBUTEROL SULFATE HFA 108 (90 BASE) MCG/ACT IN AERS: 2.0000 | INHALATION_SPRAY | Freq: Four times a day (QID) | RESPIRATORY_TRACT | 0 refills | Status: DC | PRN

## 2020-07-08 NOTE — Progress Notes (Signed)
[facility] [place], [place] [address]  Internal MEDICINE  Office Visit Note  Patient Name: Tiffany Hawkins  [id number]  [id number]  Date of Service: 07/11/2020  Chief Complaint  Patient presents with  . Cough    Started bad about 2 weeks ago, having some dry hacking cough and other times bringing up some clear and some yellowish mucus  . Sinusitis     HPI Pt is here for a sick visit. -Cough for a few weeks now. Using inhaler samples of breztri and nasal spray. Long hx of chronic cough with several ABX and steroids used without success. Also take off ACE for concern of S/E but has not resolved. -PFT was done back in Sept was normal and had a CXR in May 2021 which was normal. -UGI was previously ordered for concern of silent aspiration and will now be scheduled. -Also recommend she take Zyrtec daily now in addition to nasal spray and stop inhaler since PFT normal.  Current Medication:  Outpatient Encounter Medications as of 07/08/2020  Medication Sig  . albuterol (VENTOLIN HFA) 108 (90 Base) MCG/ACT inhaler Inhale 2 puffs into the lungs every 6 (six) hours as needed for wheezing or shortness of breath.  Marland Kitchen amLODipine (NORVASC) 5 MG tablet TAKE 2 TABLETS BY MOUTH DAILY.  Marland Kitchen Blood Glucose Monitoring Suppl (GNP TRUE METRIX GLUCOSE METER) w/Device KIT 1 each by Does not apply route daily.  . cetirizine (ZYRTEC) 10 MG chewable tablet Chew 1 tablet (10 mg total) by mouth daily.  . citalopram (CELEXA) 20 MG tablet TAKE 2 TABLETS DAILY FOR DEPRESSION.  . fenofibrate 54 MG tablet Take 1 tablet (54 mg total) by mouth daily.  . fluticasone (FLONASE) 50 MCG/ACT nasal spray Place 2 sprays into both nostrils daily.  Marland Kitchen glucose blood (TRUE METRIX BLOOD GLUCOSE TEST) test strip 1 each by Other route 3 (three) times daily after meals. DX E11.65  . ibuprofen (ADVIL) 800 MG tablet Take 1 tablet (800 mg total) by mouth every 8 (eight) hours as needed.  . insulin aspart  protamine - aspart (NOVOLOG MIX 70/30 FLEXPEN) (70-30) 100 UNIT/ML FlexPen Inject 0.35 mLs (35 Units total) into the skin 2 (two) times daily. Inject 35 units [place] QAM. Gradually increase to 40units [place] QPM  . INVOKANA 100 MG TABS tablet TAKE 1 TABLET(100 MG) BY MOUTH DAILY BEFORE BREAKFAST  . lovastatin (MEVACOR) 20 MG tablet TAKE 1 TABLET AT BEDTIME FOR HIGH CHOLESTEROL  . metaxalone (SKELAXIN) 800 MG tablet Take 1 tablet (800 mg total) by mouth 2 (two) times daily as needed for muscle spasms.  . Multiple Vitamin (MULTIVITAMIN) tablet Take by mouth daily. TAKES 1/2 TABLET  . naproxen (NAPROSYN) 500 MG tablet Take 1 tablet (500 mg total) by mouth 2 (two) times daily as needed for moderate pain.  . pantoprazole (PROTONIX) 20 MG tablet Take 2 tablets (40 mg total) by mouth daily.  . sharps container 1 each by Does not apply route as needed. To use for insulin syringes and needles. Taking insulin at least twice daily.  E11.65  . TRUEplus Lancets 33G MISC Use as directed TID for glucose test  Dx E11.65   No facility-administered encounter medications on file as of 07/08/2020.      Medical History: Past Medical History:  Diagnosis Date  . Anxiety   . Colon cancer ([place]) 10/12/2014   Stage IIB; had chemo  . Diabetes mellitus without complication ([place])   . Hyperlipidemia   . Hypertension   .  Lateral epicondylitis   . Personal history of chemotherapy   . Polio    childhood  . Skin cancer    Sqamous Cell, Lt Calf  . Sleep apnea    no CPAP, sx improved with wt loss     Vital Signs: BP 134/74   Pulse 94   Temp 98.4 F (36.9 C)   Resp 16   Ht $R'5\' 2"'tW$  (1.575 m)   Wt 152 lb 3.2 oz (69 kg)   SpO2 93%   BMI 27.84 kg/m    Review of Systems  Constitutional: Negative for fatigue and fever.  HENT: Positive for congestion and postnasal drip. Negative for mouth sores, sinus pain and trouble swallowing.   Respiratory: Positive for cough. Negative for shortness of breath and wheezing.    Cardiovascular: Negative for chest pain.  Genitourinary: Negative for flank pain.  Neurological: Negative for headaches.  Psychiatric/Behavioral: Negative.     Physical Exam Constitutional:      General: She is not in acute distress.    Appearance: She is well-developed. She is not diaphoretic.  HENT:     Head: Normocephalic and atraumatic.     Mouth/Throat:     Pharynx: No oropharyngeal exudate.  Eyes:     Pupils: Pupils are equal, round, and reactive to light.  Neck:     Thyroid: No thyromegaly.     Vascular: No JVD.     Trachea: No tracheal deviation.  Cardiovascular:     Rate and Rhythm: Normal rate and regular rhythm.     Heart sounds: Normal heart sounds. No murmur heard. No friction rub. No gallop.   Pulmonary:     Effort: Pulmonary effort is normal. No respiratory distress.     Breath sounds: Wheezing present. No rales.     Comments: Decreased breath sounds Chest:     Chest wall: No tenderness.  Abdominal:     General: Bowel sounds are normal.     Palpations: Abdomen is soft.  Musculoskeletal:        General: Normal range of motion.     Cervical back: Normal range of motion and neck supple.  Lymphadenopathy:     Cervical: No cervical adenopathy.  Skin:    General: Skin is warm and dry.  Neurological:     Mental Status: She is alert and oriented to person, place, and time.     Cranial Nerves: No cranial nerve deficit.  Psychiatric:        Behavior: Behavior normal.        Thought Content: Thought content normal.        Judgment: Judgment normal.       Assessment/Plan: 1. Chronic cough Will stop breztri sample since PFT normal, will also repeat CXR since cough is different since last visit. Will start albuterol inhaler to be used as needed. - albuterol (VENTOLIN HFA) 108 (90 Base) MCG/ACT inhaler; Inhale 2 puffs into the lungs every 6 (six) hours as needed for wheezing or shortness of breath.  Dispense: 8 g; Refill: 0 - DG Chest 2 View; Future  2. OSA  (obstructive sleep apnea) Pt is scheduled for machine set up and will start on CPAP soon.  3. Perennial allergic rhinitis Continue nasal spray as well as zyrtec daily to help with potential allergy flare contributing to trigger cough.  4. GERD without esophagitis Continue pantoprazole and will schedule upper GI to look for silent aspiration as a cause for her cough.   General Counseling: estell puccini understanding of the  findings of todays visit and agrees with plan of treatment. I have discussed any further diagnostic evaluation that may be needed or ordered today. We also reviewed her medications today. she has been encouraged to call the office with any questions or concerns that should arise related to todays visit.    Counseling:    Orders Placed This Encounter  Procedures  . DG Chest 2 View    Meds ordered this encounter  Medications  . albuterol (VENTOLIN HFA) 108 (90 Base) MCG/ACT inhaler    Sig: Inhale 2 puffs into the lungs every 6 (six) hours as needed for wheezing or shortness of breath.    Dispense:  8 g    Refill:  0    Time spent:30 Minutes

## 2020-07-10 ENCOUNTER — Encounter: Payer: Self-pay | Admitting: Hospice and Palliative Medicine

## 2020-07-12 ENCOUNTER — Other Ambulatory Visit: Payer: Self-pay | Admitting: Physician Assistant

## 2020-07-12 DIAGNOSIS — J209 Acute bronchitis, unspecified: Secondary | ICD-10-CM

## 2020-07-12 DIAGNOSIS — R053 Chronic cough: Secondary | ICD-10-CM

## 2020-07-12 MED ORDER — AZITHROMYCIN 250 MG PO TABS: ORAL_TABLET | ORAL | 0 refills | Status: DC

## 2020-07-12 NOTE — Progress Notes (Signed)
Spoke with pt regarding CXR showing bronchitis, will start azithromycin for 14 days due to prolonged cough. She will also have upper GI scheduled and f/u with [place] in 2 weeks. Continue mucinex, flonase, zyrtec, and delysm.

## 2020-07-19 ENCOUNTER — Other Ambulatory Visit: Payer: Self-pay

## 2020-07-19 ENCOUNTER — Ambulatory Visit
Admission: RE | Admit: 2020-07-19 | Discharge: 2020-07-19 | Disposition: A | Discharge status: Home / Self Care | Payer: Medicare HMO | Source: Ambulatory Visit | Attending: Internal Medicine | Admitting: Internal Medicine

## 2020-07-19 DIAGNOSIS — R059 Cough, unspecified: Secondary | ICD-10-CM | POA: Diagnosis not present

## 2020-07-19 DIAGNOSIS — K219 Gastro-esophageal reflux disease without esophagitis: Secondary | ICD-10-CM | POA: Diagnosis not present

## 2020-07-19 DIAGNOSIS — G4733 Obstructive sleep apnea (adult) (pediatric): Secondary | ICD-10-CM | POA: Diagnosis not present

## 2020-07-20 ENCOUNTER — Telehealth: Payer: Self-pay

## 2020-07-20 NOTE — Telephone Encounter (Signed)
Confirmation of verbal order for Home Medical equipment signed by provider and placed in [facility] folder. Tiffany Hawkins

## 2020-07-28 ENCOUNTER — Ambulatory Visit: Payer: Medicare HMO

## 2020-08-02 ENCOUNTER — Encounter: Payer: Self-pay | Admitting: Physician Assistant

## 2020-08-02 ENCOUNTER — Ambulatory Visit (INDEPENDENT_AMBULATORY_CARE_PROVIDER_SITE_OTHER): Payer: Medicare HMO | Admitting: Physician Assistant

## 2020-08-02 ENCOUNTER — Other Ambulatory Visit: Payer: Self-pay

## 2020-08-02 DIAGNOSIS — R053 Chronic cough: Secondary | ICD-10-CM

## 2020-08-02 DIAGNOSIS — K449 Diaphragmatic hernia without obstruction or gangrene: Secondary | ICD-10-CM | POA: Diagnosis not present

## 2020-08-02 DIAGNOSIS — K224 Dyskinesia of esophagus: Secondary | ICD-10-CM

## 2020-08-02 DIAGNOSIS — G4733 Obstructive sleep apnea (adult) (pediatric): Secondary | ICD-10-CM

## 2020-08-02 DIAGNOSIS — K219 Gastro-esophageal reflux disease without esophagitis: Secondary | ICD-10-CM | POA: Diagnosis not present

## 2020-08-02 NOTE — Progress Notes (Signed)
[facility] [place], [place] [address]  Internal MEDICINE  Office Visit Note  Patient Name: Tiffany Hawkins  [id number]  [id number]  Date of Service: 08/03/2020  Chief Complaint  Patient presents with  . Follow-up    Upper GI   . Diabetes  . Hypertension  . Anxiety  . Quality Metric Gaps    Eye exam  . Cough    Yellow mucus     HPI Pt is here for f/u.  -CXR after last visit indicated possible bronchitis. Pt took the azithromycin and cough improved some but is still coughing. Her PFT was normal and she has the albuterol as needed but hasn't needed it.  -Takes pantoprazole 2 tabs in AM. -Upper GI study showed moderate GERD, a transient hiatal hernia, and contractions in the distal esophagus. Discussed that this could be contributing to cough and will refer to GI for further management. -She does mention she just started on her CPAP and hopes that may help as well  Current Medication: Outpatient Encounter Medications as of 08/02/2020  Medication Sig  . albuterol (VENTOLIN HFA) 108 (90 Base) MCG/ACT inhaler Inhale 2 puffs into the lungs every 6 (six) hours as needed for wheezing or shortness of breath.  Marland Kitchen amLODipine (NORVASC) 5 MG tablet TAKE 2 TABLETS BY MOUTH DAILY.  Marland Kitchen Blood Glucose Monitoring Suppl (GNP TRUE METRIX GLUCOSE METER) w/Device KIT 1 each by Does not apply route daily.  . cetirizine (ZYRTEC) 10 MG chewable tablet Chew 1 tablet (10 mg total) by mouth daily.  . citalopram (CELEXA) 20 MG tablet TAKE 2 TABLETS DAILY FOR DEPRESSION.  . fenofibrate 54 MG tablet Take 1 tablet (54 mg total) by mouth daily.  . fluticasone (FLONASE) 50 MCG/ACT nasal spray Place 2 sprays into both nostrils daily.  Marland Kitchen glucose blood (TRUE METRIX BLOOD GLUCOSE TEST) test strip 1 each by Other route 3 (three) times daily after meals. DX E11.65  . ibuprofen (ADVIL) 800 MG tablet Take 1 tablet (800 mg total) by mouth every 8 (eight) hours as needed.  . insulin aspart  protamine - aspart (NOVOLOG MIX 70/30 FLEXPEN) (70-30) 100 UNIT/ML FlexPen Inject 0.35 mLs (35 Units total) into the skin 2 (two) times daily. Inject 35 units [place] QAM. Gradually increase to 40units [place] QPM  . INVOKANA 100 MG TABS tablet TAKE 1 TABLET(100 MG) BY MOUTH DAILY BEFORE BREAKFAST  . lovastatin (MEVACOR) 20 MG tablet TAKE 1 TABLET AT BEDTIME FOR HIGH CHOLESTEROL  . metaxalone (SKELAXIN) 800 MG tablet Take 1 tablet (800 mg total) by mouth 2 (two) times daily as needed for muscle spasms.  . Multiple Vitamin (MULTIVITAMIN) tablet Take by mouth daily. TAKES 1/2 TABLET  . naproxen (NAPROSYN) 500 MG tablet Take 1 tablet (500 mg total) by mouth 2 (two) times daily as needed for moderate pain.  . pantoprazole (PROTONIX) 20 MG tablet Take 2 tablets (40 mg total) by mouth daily.  . sharps container 1 each by Does not apply route as needed. To use for insulin syringes and needles. Taking insulin at least twice daily.  E11.65  . TRUEplus Lancets 33G MISC Use as directed TID for glucose test  Dx E11.65  . [DISCONTINUED] azithromycin (ZITHROMAX) 250 MG tablet Take one tab a day for 14 days   No facility-administered encounter medications on file as of 08/02/2020.    Surgical History: Past Surgical History:  Procedure Laterality Date  . APPENDECTOMY    . BACK SURGERY    . CATARACT EXTRACTION  W/PHACO Left 12/03/2017   Procedure: CATARACT EXTRACTION PHACO AND INTRAOCULAR LENS PLACEMENT (I[facility]) LEFT  DIABETIC;  Surgeon: Eulogio Bear, MD;  Location: [facility];  Service: Ophthalmology;  Laterality: Left;  Diabetic - insulin  . CATARACT EXTRACTION W/PHACO Right 01/14/2018   Procedure: CATARACT EXTRACTION PHACO AND INTRAOCULAR LENS PLACEMENT ([place]) RIGHT DIABETIC;  Surgeon: Eulogio Bear, MD;  Location: [facility];  Service: Ophthalmology;  Laterality: Right;  Diabetic - insulin  . CERVICAL DISC SURGERY    . CHOLECYSTECTOMY    . COLON SURGERY    . COLONOSCOPY    . COLONOSCOPY WITH  PROPOFOL N/A 05/17/2015   Procedure: COLONOSCOPY WITH PROPOFOL;  Surgeon: Manya Silvas, MD;  Location: [facility] ENDOSCOPY;  Service: Endoscopy;  Laterality: N/A;  . COLONOSCOPY WITH PROPOFOL N/A 04/01/2018   Procedure: COLONOSCOPY WITH BIOPSY;  Surgeon: [name], MD;  Location: [facility];  Service: Endoscopy;  Laterality: N/A;  Diabetic - insulin  . ESOPHAGOGASTRODUODENOSCOPY    . HERNIA REPAIR    . POLYPECTOMY N/A 04/01/2018   Procedure: POLYPECTOMY;  Surgeon: [name], MD;  Location: [facility];  Service: Endoscopy;  Laterality: N/A;  . PORT A CATH INJECTION ([place] [facility])     Port has been removed  . TUBAL LIGATION      Medical History: Past Medical History:  Diagnosis Date  . Anxiety   . Colon cancer ([place]) 10/12/2014   Stage IIB; had chemo  . Diabetes mellitus without complication ([place])   . Hyperlipidemia   . Hypertension   . Lateral epicondylitis   . Personal history of chemotherapy   . Polio    childhood  . Skin cancer    Sqamous Cell, Lt Calf  . Sleep apnea    no CPAP, sx improved with wt loss    Family History: Family History  Problem Relation Age of Onset  . Alzheimer's disease Mother   . Heart disease Father   . Colon cancer Cousin   . Colon cancer Cousin   . Cancer Cousin        Brain Tumor  . Breast cancer Neg Hx     Social History   Socioeconomic History  . Marital status: Married    Spouse name: Not on file  . Number of children: Not on file  . Years of education: Not on file  . Highest education level: Not on file  Occupational History  . Not on file  Tobacco Use  . Smoking status: Former Smoker    Packs/day: 1.50    Years: 20.00    Pack years: 30.00    Types: Cigarettes    Quit date: 04/17/1981    Years since quitting: 39.3  . Smokeless tobacco: Never Used  Vaping Use  . Vaping Use: Never used  Substance and Sexual Activity  . Alcohol use: No  . Drug use: No  . Sexual activity: Never  Other Topics Concern  . Not  on file  Social History Narrative  . Not on file   Social Determinants of Health   Financial Resource Strain: Not on file  Food Insecurity: Not on file  Transportation Needs: Not on file  Physical Activity: Not on file  Stress: Not on file  Social Connections: Not on file  Intimate Partner Violence: Not on file      Review of Systems  Constitutional: Positive for fatigue. Negative for chills and unexpected weight change.  HENT: Positive for postnasal drip. Negative for congestion, rhinorrhea, sneezing and  sore throat.   Eyes: Negative for redness.  Respiratory: Positive for apnea and cough. Negative for chest tightness, shortness of breath and wheezing.   Cardiovascular: Negative for chest pain and palpitations.  Gastrointestinal: Negative for abdominal pain, constipation, diarrhea, nausea and vomiting.  Genitourinary: Negative for dysuria and frequency.  Musculoskeletal: Negative for arthralgias, back pain, joint swelling and neck pain.  Skin: Negative for rash.  Neurological: Negative.  Negative for tremors and numbness.  Hematological: Negative for adenopathy. Does not bruise/bleed easily.  Psychiatric/Behavioral: Negative for behavioral problems (Depression), sleep disturbance and suicidal ideas. The patient is not nervous/anxious.     Vital Signs: BP 136/84   Pulse 87   Temp 97.8 F (36.6 C)   Resp 16   Ht 5' 1.5" (1.562 m)   Wt 157 lb (71.2 kg)   SpO2 97%   BMI 29.18 kg/m    Physical Exam Vitals and nursing note reviewed.  Constitutional:      General: She is not in acute distress.    Appearance: She is well-developed. She is not diaphoretic.  HENT:     Head: Normocephalic and atraumatic.     Mouth/Throat:     Pharynx: No oropharyngeal exudate.  Eyes:     Pupils: Pupils are equal, round, and reactive to light.  Neck:     Thyroid: No thyromegaly.     Vascular: No JVD.     Trachea: No tracheal deviation.  Cardiovascular:     Rate and Rhythm: Normal  rate and regular rhythm.     Heart sounds: Normal heart sounds. No murmur heard. No friction rub. No gallop.   Pulmonary:     Effort: Pulmonary effort is normal. No respiratory distress.     Breath sounds: No wheezing or rales.  Chest:     Chest wall: No tenderness.  Abdominal:     General: Bowel sounds are normal.     Palpations: Abdomen is soft.     Tenderness: There is no abdominal tenderness.  Musculoskeletal:        General: Normal range of motion.     Cervical back: Normal range of motion and neck supple.  Lymphadenopathy:     Cervical: No cervical adenopathy.  Skin:    General: Skin is warm and dry.  Neurological:     Mental Status: She is alert and oriented to person, place, and time.     Cranial Nerves: No cranial nerve deficit.  Psychiatric:        Behavior: Behavior normal.        Thought Content: Thought content normal.        Judgment: Judgment normal.        Assessment/Plan: 1. Chronic cough Minimal improvement with azithromycin for possible bronchitis seen on CXR. Discussed bronchitis can take awhile to resolve however cough has been ongoing for months with normal PFT. Will look into alternative non-pulmonary causes. May need to consider CT chest  2. GERD without esophagitis Continue famotidine. Based on Upper GI will refer to GI for further management. - Ambulatory referral to Gastroenterology  3. Hiatal hernia Based on Upper GI will refer to GI for further management. - Ambulatory referral to Gastroenterology  4. Esophageal spasm Based on Upper GI will refer to GI for further management. - Ambulatory referral to Gastroenterology  5. OSA (obstructive sleep apnea) Recently started on CPAP will continue.   General Counseling: bryanah sidell understanding of the findings of todays visit and agrees with plan of treatment. I have discussed any  further diagnostic evaluation that may be needed or ordered today. We also reviewed her medications today.  she has been encouraged to call the office with any questions or concerns that should arise related to todays visit.    Orders Placed This Encounter  Procedures  . Ambulatory referral to Gastroenterology    No orders of the defined types were placed in this encounter.   This patient was seen by Drema Dallas, PA-C in collaboration with Dr. Clayborn Bigness as a part of collaborative care agreement.   Total time spent:30 Minutes Time spent includes review of chart, medications, test results, and follow up plan with the patient.      Dr Lavera Guise Internal medicine

## 2020-08-03 ENCOUNTER — Encounter: Payer: Self-pay | Admitting: *Deleted

## 2020-08-09 ENCOUNTER — Other Ambulatory Visit: Payer: Self-pay | Admitting: Nurse Practitioner

## 2020-08-09 DIAGNOSIS — E782 Mixed hyperlipidemia: Secondary | ICD-10-CM

## 2020-08-18 DIAGNOSIS — G4733 Obstructive sleep apnea (adult) (pediatric): Secondary | ICD-10-CM | POA: Diagnosis not present

## 2020-08-31 ENCOUNTER — Encounter: Payer: Self-pay | Admitting: Nurse Practitioner

## 2020-08-31 ENCOUNTER — Other Ambulatory Visit: Payer: Self-pay

## 2020-08-31 ENCOUNTER — Ambulatory Visit (INDEPENDENT_AMBULATORY_CARE_PROVIDER_SITE_OTHER): Payer: Medicare HMO | Admitting: Nurse Practitioner

## 2020-08-31 VITALS — BP 130/68 | HR 90 | Temp 97.2°F | Resp 16 | Ht 61.5 in | Wt 158.4 lb

## 2020-08-31 DIAGNOSIS — E782 Mixed hyperlipidemia: Secondary | ICD-10-CM

## 2020-08-31 DIAGNOSIS — R053 Chronic cough: Secondary | ICD-10-CM | POA: Diagnosis not present

## 2020-08-31 DIAGNOSIS — N1831 Chronic kidney disease, stage 3a: Secondary | ICD-10-CM

## 2020-08-31 DIAGNOSIS — I10 Essential (primary) hypertension: Secondary | ICD-10-CM

## 2020-08-31 DIAGNOSIS — I700 Atherosclerosis of aorta: Secondary | ICD-10-CM

## 2020-08-31 DIAGNOSIS — E1165 Type 2 diabetes mellitus with hyperglycemia: Secondary | ICD-10-CM | POA: Diagnosis not present

## 2020-08-31 LAB — POCT GLYCOSYLATED HEMOGLOBIN (HGB A1C): Hemoglobin A1C: 6.3 % — AB (ref 4.0–5.6)

## 2020-08-31 NOTE — Progress Notes (Signed)
[facility] [place], [place] [address]  Internal MEDICINE  Office Visit Note  Patient Name: Tiffany Hawkins  [id number]  [id number]  Date of Service: 09/02/2020  Chief Complaint  Patient presents with  . Follow-up    Cough, fatigue, started last Tuesday   . Hyperlipidemia  . Hypertension  . Sleep Apnea  . Anxiety  . Diabetes    HPI Bianney presents for a follow up visit for a1c check and to discuss HLD, HTN, OSA, anxiety and diabetes.  - reports chronic bronchitis, has had a chronic cough x1 year, when it gets bad, she gets antibiotics, it may resolve for a short period of time but then it comes back. She is a nonsmoker. Sputum may be yellow, brown or clear/white. March 2022 chest xray was negative. She visits with her grandchildren daily. Her daughter is worried that she may be contracting respiratory infections from her grandchildren.  AHI was 45.4 on her sleep study, uses cpap at night., she reports that this has been helping but her mask not fitting right, encouraged to call sleep clinic to troubleshoot her mask.  Sueann Brownley is diabetic. Her A1c was 6.3 today (down from 6.9).  -Blood pressure well controlled with current medications, today 130/68 -history of hyperlipidemia taking fenofibrate and lovastatin, most recent lipid panel from February 2022: VLDL 43 Tri 257 HDL 35    Current Medication: Outpatient Encounter Medications as of 08/31/2020  Medication Sig  . chlorpheniramine-HYDROcodone (TUSSIONEX PENNKINETIC ER) 10-8 MG/5ML SUER Take 5 mLs by mouth at bedtime as needed for cough.  . Dapagliflozin Propanediol (FARXIGA PO) Take by mouth.  Marland Kitchen albuterol (VENTOLIN HFA) 108 (90 Base) MCG/ACT inhaler Inhale 2 puffs into the lungs every 6 (six) hours as needed for wheezing or shortness of breath.  Marland Kitchen amLODipine (NORVASC) 5 MG tablet TAKE 2 TABLETS BY MOUTH DAILY.  Marland Kitchen Blood Glucose Monitoring Suppl (GNP TRUE METRIX GLUCOSE METER) w/Device KIT 1 each by Does  not apply route daily.  . cetirizine (ZYRTEC) 10 MG chewable tablet Chew 1 tablet (10 mg total) by mouth daily.  . citalopram (CELEXA) 20 MG tablet TAKE 2 TABLETS DAILY FOR DEPRESSION.  . fenofibrate 54 MG tablet Take 1 tablet (54 mg total) by mouth daily.  . fluticasone (FLONASE) 50 MCG/ACT nasal spray Place 2 sprays into both nostrils daily.  Marland Kitchen glucose blood (TRUE METRIX BLOOD GLUCOSE TEST) test strip 1 each by Other route 3 (three) times daily after meals. DX E11.65  . ibuprofen (ADVIL) 800 MG tablet Take 1 tablet (800 mg total) by mouth every 8 (eight) hours as needed.  . insulin aspart protamine - aspart (NOVOLOG MIX 70/30 FLEXPEN) (70-30) 100 UNIT/ML FlexPen Inject 0.35 mLs (35 Units total) into the skin 2 (two) times daily. Inject 35 units [place] QAM. Gradually increase to 40units [place] QPM  . INVOKANA 100 MG TABS tablet TAKE 1 TABLET(100 MG) BY MOUTH DAILY BEFORE BREAKFAST  . lovastatin (MEVACOR) 20 MG tablet TAKE 1 TABLET AT BEDTIME FOR HIGH CHOLESTEROL  . metaxalone (SKELAXIN) 800 MG tablet Take 1 tablet (800 mg total) by mouth 2 (two) times daily as needed for muscle spasms.  . Multiple Vitamin (MULTIVITAMIN) tablet Take by mouth daily. TAKES 1/2 TABLET  . naproxen (NAPROSYN) 500 MG tablet Take 1 tablet (500 mg total) by mouth 2 (two) times daily as needed for moderate pain.  . pantoprazole (PROTONIX) 20 MG tablet Take 2 tablets (40 mg total) by mouth daily.  . sharps container 1  each by Does not apply route as needed. To use for insulin syringes and needles. Taking insulin at least twice daily.  E11.65  . TRUEplus Lancets 33G MISC Use as directed TID for glucose test  Dx E11.65   No facility-administered encounter medications on file as of 08/31/2020.    Surgical History: Past Surgical History:  Procedure Laterality Date  . APPENDECTOMY    . BACK SURGERY    . CATARACT EXTRACTION W/PHACO Left 12/03/2017   Procedure: CATARACT EXTRACTION PHACO AND INTRAOCULAR LENS PLACEMENT ([place]) LEFT   DIABETIC;  Surgeon: Eulogio Bear, MD;  Location: [place];  Service: Ophthalmology;  Laterality: Left;  Diabetic - insulin  . CATARACT EXTRACTION W/PHACO Right 01/14/2018   Procedure: CATARACT EXTRACTION PHACO AND INTRAOCULAR LENS PLACEMENT ([place]) RIGHT DIABETIC;  Surgeon: Eulogio Bear, MD;  Location: [place];  Service: Ophthalmology;  Laterality: Right;  Diabetic - insulin  . CERVICAL DISC SURGERY    . CHOLECYSTECTOMY    . COLON SURGERY    . COLONOSCOPY    . COLONOSCOPY WITH PROPOFOL N/A 05/17/2015   Procedure: COLONOSCOPY WITH PROPOFOL;  Surgeon: Manya Silvas, MD;  Location[facility] ENDOSCOPY;  Service: Endoscopy;  Laterality: N/A;  . COLONOSCOPY WITH PROPOFOL N/A 04/01/2018   Procedure: COLONOSCOPY WITH BIOPSY;  Surgeon: [name], MD;  Location: [place];  Service: Endoscopy;  Laterality: N/A;  Diabetic - insulin  . ESOPHAGOGASTRODUODENOSCOPY    . HERNIA REPAIR    . POLYPECTOMY N/A 04/01/2018   Procedure: POLYPECTOMY;  Surgeon: [name], MD;  Location: [place];  Service: Endoscopy;  Laterality: N/A;  . PORT A CATH INJECTION ([place] [facility])     Port has been removed  . TUBAL LIGATION      Medical History: Past Medical History:  Diagnosis Date  . Anxiety   . Colon cancer ([place]) 10/12/2014   Stage IIB; had chemo  . Diabetes mellitus without complication ([place])   . Hyperlipidemia   . Hypertension   . Lateral epicondylitis   . Personal history of chemotherapy   . Polio    childhood  . Skin cancer    Sqamous Cell, Lt Calf  . Sleep apnea    no CPAP, sx improved with wt loss    Family History: Family History  Problem Relation Age of Onset  . Alzheimer's disease Mother   . Heart disease Father   . Colon cancer Cousin   . Colon cancer Cousin   . Cancer Cousin        Brain Tumor  . Breast cancer Neg Hx     Social History   Socioeconomic History  . Marital status: Married    Spouse name: Not on file  . Number of  children: Not on file  . Years of education: Not on file  . Highest education level: Not on file  Occupational History  . Not on file  Tobacco Use  . Smoking status: Former Smoker    Packs/day: 1.50    Years: 20.00    Pack years: 30.00    Types: Cigarettes    Quit date: 04/17/1981    Years since quitting: 39.4  . Smokeless tobacco: Never Used  Vaping Use  . Vaping Use: Never used  Substance and Sexual Activity  . Alcohol use: No  . Drug use: No  . Sexual activity: Never  Other Topics Concern  . Not on file  Social History Narrative  . Not on file   Social Determinants of  Health   Financial Resource Strain: Not on file  Food Insecurity: Not on file  Transportation Needs: Not on file  Physical Activity: Not on file  Stress: Not on file  Social Connections: Not on file  Intimate Partner Violence: Not on file      Review of Systems  Constitutional: Negative for activity change, appetite change, chills, diaphoresis, fatigue, fever and unexpected weight change.  HENT: Negative for congestion, ear discharge, ear pain, facial swelling, hearing loss, nosebleeds, postnasal drip, rhinorrhea, sinus pressure, sinus pain, sneezing, sore throat, tinnitus, trouble swallowing and voice change.   Eyes: Negative for photophobia, pain, discharge, redness, itching and visual disturbance.  Respiratory: Negative for apnea, cough, choking, chest tightness, shortness of breath, wheezing and stridor.   Cardiovascular: Negative for chest pain, palpitations and leg swelling.  Gastrointestinal: Negative for abdominal distention, abdominal pain, anal bleeding, constipation, diarrhea, nausea and rectal pain.  Endocrine: Negative for cold intolerance, heat intolerance, polydipsia, polyphagia and polyuria.  Genitourinary: Negative for difficulty urinating, flank pain, frequency, genital sores, hematuria, menstrual problem, pelvic pain, urgency, vaginal bleeding, vaginal discharge and vaginal pain.   Musculoskeletal: Negative for arthralgias, back pain, gait problem, joint swelling, myalgias and neck pain.  Skin: Negative for color change, pallor, rash and wound.  Allergic/Immunologic: Negative for environmental allergies, food allergies and immunocompromised state.  Neurological: Negative for dizziness, seizures, syncope, facial asymmetry, speech difficulty, weakness, light-headedness, numbness and headaches.  Hematological: Negative for adenopathy. Does not bruise/bleed easily.  Psychiatric/Behavioral: Negative for agitation, behavioral problems, confusion, decreased concentration, dysphoric mood, hallucinations, self-injury, sleep disturbance and suicidal ideas. The patient is not nervous/anxious and is not hyperactive.     Vital Signs: BP 130/68   Pulse 90   Temp (!) 97.2 F (36.2 C)   Resp 16   Ht 5' 1.5" (1.562 m)   Wt 158 lb 6.4 oz (71.8 kg)   SpO2 96%   BMI 29.44 kg/m    Physical Exam Vitals reviewed.  Constitutional:      General: She is not in acute distress.    Appearance: She is well-developed and overweight. She is not ill-appearing or diaphoretic.  HENT:     Head: Normocephalic and atraumatic.     Right Ear: Tympanic membrane, ear canal and external ear normal.     Left Ear: Tympanic membrane, ear canal and external ear normal.     Nose: Nose normal.     Mouth/Throat:     Mouth: Mucous membranes are moist.     Pharynx: Oropharynx is clear. No oropharyngeal exudate or posterior oropharyngeal erythema.  Eyes:     General: No scleral icterus.       Right eye: No discharge.        Left eye: No discharge.     Extraocular Movements: Extraocular movements intact.     Conjunctiva/sclera: Conjunctivae normal.     Pupils: Pupils are equal, round, and reactive to light.  Neck:     Thyroid: No thyromegaly.     Vascular: No JVD.     Trachea: No tracheal deviation.  Cardiovascular:     Rate and Rhythm: Normal rate and regular rhythm.     Pulses: Normal pulses.      Heart sounds: Normal heart sounds. No murmur heard. No friction rub. No gallop.   Pulmonary:     Effort: Pulmonary effort is normal. No respiratory distress.     Breath sounds: Normal breath sounds. No stridor. No wheezing or rales.  Chest:     Chest wall:  No tenderness.  Abdominal:     General: Abdomen is flat. Bowel sounds are normal. There is no distension.     Palpations: Abdomen is soft. There is no mass.     Tenderness: There is no abdominal tenderness. There is no guarding or rebound.  Musculoskeletal:        General: No tenderness or deformity. Normal range of motion.     Cervical back: Normal range of motion and neck supple. No tenderness.  Lymphadenopathy:     Cervical: No cervical adenopathy.  Skin:    General: Skin is warm and dry.     Capillary Refill: Capillary refill takes less than 2 seconds.     Coloration: Skin is not pale.     Findings: No erythema or rash.  Neurological:     Mental Status: She is alert and oriented to person, place, and time.     Cranial Nerves: No cranial nerve deficit.     Motor: No abnormal muscle tone.     Coordination: Coordination normal.     Deep Tendon Reflexes: Reflexes are normal and symmetric.  Psychiatric:        Mood and Affect: Mood normal.        Behavior: Behavior normal.        Thought Content: Thought content normal.        Judgment: Judgment normal.    Assessment/Plan: 1. Chronic cough Chronic cough x1 year. Chest xray not ordered since done in march 2022. PFT ordered to assess respiratory status. CT of sinuses ordered to rule out chronic sinusitis. Sputum cytology ordered to rule out eosinophilic bronchitis. Cough syrup reordered.  - Pulmonary Function Test; Future - CT MAXILLOFACIAL WO CONTRAST; Future - Cytology - non gyn - chlorpheniramine-HYDROcodone (TUSSIONEX PENNKINETIC ER) 10-8 MG/5ML SUER; Take 5 mLs by mouth at bedtime as needed for cough.  Dispense: 140 mL; Refill: 0  2. Stage 3a chronic kidney disease  (HCC) History of stage 3 kidney disease. Last creatinine and GFR was checked in February 2022: 1.11 and GFR of 49. Renal ultrasound from 2020 was normal except for single simple renal cysts bilaterally. No urine microalbumin has been checked, will consider at follow up visit.   3. Atherosclerosis of aorta (HCC) History of aortic atherosclerosis but no recent echocardiogram. Will consider echo to assess.   4. Mixed hyperlipidemia Lipid panel with multiple abnormals, taking fenofibrate and lovastatin. Recheck lipid panel at follow up visit.  5. Uncontrolled type 2 diabetes mellitus with hyperglycemia (HCC) A1C was 6.3 (down from 6.9 in February 2022). Wilder Glade did cause the patient to develop vaginal yeast infection for which she went to her GYN provider. Her GYN put her on maintenance fluconazole and instructed her to take it as long as she is taking [place]. Continue medications as ordered.  - POCT HgB A1C - Dapagliflozin Propanediol (FARXIGA PO); Take by mouth.  6. Essential hypertension Blood pressure is well controlled with amlodipine 5 mg daily. BP today was 130/68.   General Counseling: elara cocke understanding of the findings of todays visit and agrees with plan of treatment. I have discussed any further diagnostic evaluation that may be needed or ordered today. We also reviewed her medications today. she has been encouraged to call the office with any questions or concerns that should arise related to todays visit.    Orders Placed This Encounter  Procedures  . CT MAXILLOFACIAL WO CONTRAST  . POCT HgB A1C  . Pulmonary Function Test    Meds ordered this  encounter  Medications  . chlorpheniramine-HYDROcodone (TUSSIONEX PENNKINETIC ER) 10-8 MG/5ML SUER    Sig: Take 5 mLs by mouth at bedtime as needed for cough.    Dispense:  140 mL    Refill:  0   Return in about 1 month (around 10/01/2020) for F/U, Review labs/test, Ceairra Mccarver PCP.  Total time spent:30 Minutes Time spent  includes review of chart, medications, test results, and follow up plan with the patient.   [place] Controlled Substance Database was reviewed by me.  This patient was seen by Jonetta Osgood, FNP-C in collaboration with Dr. Clayborn Bigness as a part of collaborative care agreement.   Dr Lavera Guise Internal medicine

## 2020-09-01 MED ORDER — HYDROCOD POLST-CPM POLST ER 10-8 MG/5ML PO SUER: 5.0000 mL | Freq: Every evening | ORAL | 0 refills | Status: DC | PRN

## 2020-09-02 ENCOUNTER — Telehealth: Payer: Self-pay

## 2020-09-02 NOTE — Telephone Encounter (Signed)
Patient advised of CT scheduled at [facility] on 09-15-2020 at 7:45a. Tiffany Hawkins

## 2020-09-15 ENCOUNTER — Ambulatory Visit: Payer: Medicare HMO | Admitting: Internal Medicine

## 2020-09-15 ENCOUNTER — Other Ambulatory Visit: Payer: Self-pay

## 2020-09-15 ENCOUNTER — Ambulatory Visit
Admission: RE | Admit: 2020-09-15 | Discharge: 2020-09-15 | Disposition: A | Discharge status: Home / Self Care | Payer: Medicare HMO | Source: Ambulatory Visit | Attending: Nurse Practitioner | Admitting: Nurse Practitioner

## 2020-09-15 DIAGNOSIS — R0602 Shortness of breath: Secondary | ICD-10-CM | POA: Diagnosis not present

## 2020-09-15 DIAGNOSIS — H748X3 Other specified disorders of middle ear and mastoid, bilateral: Secondary | ICD-10-CM | POA: Diagnosis not present

## 2020-09-15 DIAGNOSIS — J3489 Other specified disorders of nose and nasal sinuses: Secondary | ICD-10-CM | POA: Diagnosis not present

## 2020-09-15 DIAGNOSIS — R053 Chronic cough: Secondary | ICD-10-CM | POA: Insufficient documentation

## 2020-09-15 DIAGNOSIS — J0130 Acute sphenoidal sinusitis, unspecified: Secondary | ICD-10-CM | POA: Diagnosis not present

## 2020-09-15 DIAGNOSIS — J0110 Acute frontal sinusitis, unspecified: Secondary | ICD-10-CM | POA: Diagnosis not present

## 2020-09-15 LAB — PULMONARY FUNCTION TEST

## 2020-09-17 DIAGNOSIS — E119 Type 2 diabetes mellitus without complications: Secondary | ICD-10-CM | POA: Diagnosis not present

## 2020-09-17 DIAGNOSIS — Z0100 Encounter for examination of eyes and vision without abnormal findings: Secondary | ICD-10-CM | POA: Diagnosis not present

## 2020-09-18 DIAGNOSIS — G4733 Obstructive sleep apnea (adult) (pediatric): Secondary | ICD-10-CM | POA: Diagnosis not present

## 2020-09-19 NOTE — Procedures (Signed)
[facility] [place], [address]    Complete Pulmonary Function Testing Interpretation:  FINDINGS:  Forced vital capacity is normal FEV1 is normal FEV1 FVC ratio was normal.  Postbronchodilator there is no significant change in the FEV1 clinical improvement may still occur.  Total lung capacity is normal residual volume is normal residual internal capacity ratio is increased.  FRC is normal.  DLCO is normal.  IMPRESSION:  This pulmonary function study is within normal limits clinical correlation is recommended  Allyne Gee, MD [facility] Pulmonary Critical Care Medicine Sleep Medicine

## 2020-09-21 DIAGNOSIS — G4733 Obstructive sleep apnea (adult) (pediatric): Secondary | ICD-10-CM | POA: Diagnosis not present

## 2020-09-23 ENCOUNTER — Other Ambulatory Visit: Payer: Medicare HMO

## 2020-09-23 ENCOUNTER — Ambulatory Visit: Payer: Medicare HMO | Admitting: Hematology and Oncology

## 2020-09-24 ENCOUNTER — Other Ambulatory Visit: Payer: Self-pay | Admitting: Internal Medicine

## 2020-09-24 DIAGNOSIS — I10 Essential (primary) hypertension: Secondary | ICD-10-CM

## 2020-09-30 ENCOUNTER — Other Ambulatory Visit: Payer: Self-pay

## 2020-09-30 ENCOUNTER — Encounter: Payer: Self-pay | Admitting: Nurse Practitioner

## 2020-09-30 ENCOUNTER — Ambulatory Visit (INDEPENDENT_AMBULATORY_CARE_PROVIDER_SITE_OTHER): Payer: Medicare HMO | Admitting: Nurse Practitioner

## 2020-09-30 VITALS — BP 142/94 | HR 85 | Temp 97.6°F | Resp 16 | Ht 61.5 in | Wt 156.6 lb

## 2020-09-30 DIAGNOSIS — I700 Atherosclerosis of aorta: Secondary | ICD-10-CM | POA: Diagnosis not present

## 2020-09-30 DIAGNOSIS — R053 Chronic cough: Secondary | ICD-10-CM

## 2020-09-30 DIAGNOSIS — I10 Essential (primary) hypertension: Secondary | ICD-10-CM

## 2020-09-30 DIAGNOSIS — K219 Gastro-esophageal reflux disease without esophagitis: Secondary | ICD-10-CM | POA: Diagnosis not present

## 2020-09-30 NOTE — Progress Notes (Signed)
[facility] [place], [place] [address]  Internal MEDICINE  Office Visit Note  Patient Name: Tiffany Hawkins  [id number]  [id number]  Date of Service: 10/11/2020  Chief Complaint  Patient presents with   Follow-up    Review labs/test/pft results, cough, discuss sharps container   Diabetes   Sleep Apnea   Hypertension   Hyperlipidemia   Anxiety   Quality Metric Gaps    shingrix    HPI Tiffany Hawkins present for a follow up visit regarding a chronic persistent cough. At her previous visit, a PFT was ordered to rule out asthma. A maxillofacial CT was ordered to rule out upper airway cough syndrome and chronic sinusitis. She was previously on lisinopril  and it was discontinued in February 2022. She also has gastroesophageal reflux that can cause a chronic cough.   Chest xray from march 2022 showed possible bronchitis, may need repeat chest xray.  CT maxillofacial - patent sinus drainage pathways with some mild mucosal thickening in the maxillary sinuses. Chronic sinusitis or upper airway cough syndrome are less likely Esophageal imaging showed spasms in the distal third of the esophagus which could induce coughing  4.    Pulmonary function test results were normal, patient does not have asthma which can cause a chronic cough.    Current Medication: Outpatient Encounter Medications as of 09/30/2020  Medication Sig   albuterol (VENTOLIN HFA) 108 (90 Base) MCG/ACT inhaler Inhale 2 puffs into the lungs every 6 (six) hours as needed for wheezing or shortness of breath.   amLODipine (NORVASC) 5 MG tablet TAKE 2 TABLETS DAILY.   Blood Glucose Monitoring Suppl (TRUE METRIX METER) w/Device KIT USE AS DIRECTED   cetirizine (ZYRTEC) 10 MG chewable tablet Chew 1 tablet (10 mg total) by mouth daily.   chlorpheniramine-HYDROcodone (TUSSIONEX PENNKINETIC ER) 10-8 MG/5ML SUER Take 5 mLs by mouth at bedtime as needed for cough.   citalopram (CELEXA) 20 MG tablet TAKE 2 TABLETS  DAILY FOR DEPRESSION.   Dapagliflozin Propanediol (FARXIGA PO) Take by mouth.   fenofibrate 54 MG tablet Take 1 tablet (54 mg total) by mouth daily.   fluticasone (FLONASE) 50 MCG/ACT nasal spray Place 2 sprays into both nostrils daily.   ibuprofen (ADVIL) 800 MG tablet Take 1 tablet (800 mg total) by mouth every 8 (eight) hours as needed.   insulin aspart protamine - aspart (NOVOLOG MIX 70/30 FLEXPEN) (70-30) 100 UNIT/ML FlexPen Inject 0.35 mLs (35 Units total) into the skin 2 (two) times daily. Inject 35 units [place] QAM. Gradually increase to 40units [place] QPM   lovastatin (MEVACOR) 20 MG tablet TAKE 1 TABLET AT BEDTIME FOR HIGH CHOLESTEROL   Multiple Vitamin (MULTIVITAMIN) tablet Take by mouth daily. TAKES 1/2 TABLET   pantoprazole (PROTONIX) 20 MG tablet Take 2 tablets (40 mg total) by mouth daily.   sharps container 1 each by Does not apply route as needed. To use for insulin syringes and needles. Taking insulin at least twice daily.  E11.65   TRUE METRIX BLOOD GLUCOSE TEST test strip TEST BLOOD SUGAR THREE TIMES DAILY AFTER MEALS   TRUEplus Lancets 33G MISC Use as directed TID for glucose test  Dx E11.65   [DISCONTINUED] INVOKANA 100 MG TABS tablet TAKE 1 TABLET(100 MG) BY MOUTH DAILY BEFORE BREAKFAST (Patient not taking: Reported on 09/30/2020)   [DISCONTINUED] metaxalone (SKELAXIN) 800 MG tablet Take 1 tablet (800 mg total) by mouth 2 (two) times daily as needed for muscle spasms. (Patient not taking: Reported on 09/30/2020)   [  DISCONTINUED] naproxen (NAPROSYN) 500 MG tablet Take 1 tablet (500 mg total) by mouth 2 (two) times daily as needed for moderate pain. (Patient not taking: Reported on 09/30/2020)   No facility-administered encounter medications on file as of 09/30/2020.    Surgical History: Past Surgical History:  Procedure Laterality Date   APPENDECTOMY     BACK SURGERY     CATARACT EXTRACTION W/PHACO Left 12/03/2017   Procedure: CATARACT EXTRACTION PHACO AND INTRAOCULAR LENS PLACEMENT  ([place]) LEFT  DIABETIC;  Surgeon: Eulogio Bear, MD;  Location: [facility];  Service: Ophthalmology;  Laterality: Left;  Diabetic - insulin   CATARACT EXTRACTION W/PHACO Right 01/14/2018   Procedure: CATARACT EXTRACTION PHACO AND INTRAOCULAR LENS PLACEMENT ([facility]) RIGHT DIABETIC;  Surgeon: Eulogio Bear, MD;  Location: [facility];  Service: Ophthalmology;  Laterality: Right;  Diabetic - insulin   CERVICAL DISC SURGERY     CHOLECYSTECTOMY     COLON SURGERY     COLONOSCOPY     COLONOSCOPY WITH PROPOFOL N/A 05/17/2015   Procedure: COLONOSCOPY WITH PROPOFOL;  Surgeon: Manya Silvas, MD;  Location[facility] END[facility]COPY;  Service: Endoscopy;  Laterality: N/A;   COLONOSCOPY WITH PROPOFOL N/A 04/01/2018   Procedure: COLONOSCOPY WITH BIOPSY;  Surgeon: [name], MD;  Location: [facility];  Service: Endoscopy;  Laterality: N/A;  Diabetic - insulin   ESOPHAGOGASTRODUODENOSCOPY     HERNIA REPAIR     POLYPECTOMY N/A 04/01/2018   Procedure: POLYPECTOMY;  Surgeon: [name], MD;  Location: [facility];  Service: Endoscopy;  Laterality: N/A;   PORT A CATH INJECTION ([place] [facility])     Port has been removed   TUBAL LIGATION      Medical History: Past Medical History:  Diagnosis Date   Anxiety    Colon cancer ([place]) 10/12/2014   Stage IIB; had chemo   Diabetes mellitus without complication (HCC)    Hyperlipidemia    Hypertension    Lateral epicondylitis    Personal history of chemotherapy    Polio    childhood   Skin cancer    Sqamous Cell, Lt Calf   Sleep apnea    no CPAP, sx improved with wt loss    Family History: Family History  Problem Relation Age of Onset   Alzheimer's disease Mother    Heart disease Father    Colon cancer Cousin    Colon cancer Cousin    Cancer Cousin        Brain Tumor   Breast cancer Neg Hx     Social History   Socioeconomic History   Marital status: Married    Spouse name: Not on file   Number of children: Not on file    Years of education: Not on file   Highest education level: Not on file  Occupational History   Not on file  Tobacco Use   Smoking status: Former    Packs/day: 1.50    Years: 20.00    Pack years: 30.00    Types: Cigarettes    Quit date: 04/17/1981    Years since quitting: 39.5   Smokeless tobacco: Never  Vaping Use   Vaping Use: Never used  Substance and Sexual Activity   Alcohol use: No   Drug use: No   Sexual activity: Never  Other Topics Concern   Not on file  Social History Narrative   Not on file   Social Determinants of Health   Financial Resource Strain: Not on file  Food Insecurity:  Not on file  Transportation Needs: Not on file  Physical Activity: Not on file  Stress: Not on file  Social Connections: Not on file  Intimate Partner Violence: Not on file      Review of Systems  Constitutional:  Negative for appetite change, chills, fatigue and fever.  HENT:  Negative for congestion, ear pain, postnasal drip, rhinorrhea, sinus pressure, sinus pain, sneezing, sore throat and trouble swallowing.   Respiratory:  Positive for cough (dry or productive cough, sputum is white or yellow). Negative for chest tightness, shortness of breath and wheezing.   Cardiovascular:  Negative for chest pain.  Gastrointestinal:  Negative for abdominal pain, diarrhea, nausea and vomiting.  Genitourinary: Negative.   Neurological:  Negative for dizziness, light-headedness and headaches.   Vital Signs: BP (!) 142/94   Pulse 85   Temp 97.6 F (36.4 C)   Resp 16   Ht 5' 1.5" (1.562 m)   Wt 156 lb 9.6 oz (71 kg)   SpO2 97%   BMI 29.11 kg/m    Physical Exam Vitals reviewed.  Constitutional:      General: She is not in acute distress.    Appearance: Normal appearance. She is not ill-appearing.  HENT:     Head: Normocephalic and atraumatic.  Cardiovascular:     Rate and Rhythm: Normal rate and regular rhythm.     Pulses: Normal pulses.     Heart sounds: Normal heart sounds.  No murmur heard. Pulmonary:     Effort: Pulmonary effort is normal. No respiratory distress.     Breath sounds: Normal breath sounds.  Abdominal:     General: Bowel sounds are normal.     Palpations: Abdomen is soft.  Skin:    General: Skin is warm and dry.     Capillary Refill: Capillary refill takes less than 2 seconds.  Neurological:     Mental Status: She is alert and oriented to person, place, and time.     Assessment/Plan: 1. Chronic cough PFT was normal, asthma not likely. CT Maxillofacial-sinus drainage pathways are patent, some mild mucosal thickening, upper airway cough syndrome or chronic sinusitis are less likely. She stopped taking lisinopril 4 months ago. Her most recent xray in march did show possible bronchitis. May need repeat xray to rule out bronchitis. Patient has GERD, cannot rule out as a possible cause of chronic cough. Provided patient with a [facility] order form and a specimen cup to provide a sputum specimen for a gram stain and culture. One specific value that may be significant is the eosinophil count in the sputum to rule out nonasthmatic eosinophilic bronchitis. -DG chest 2 view Will send patient with specimen cup and lab for for gram stain smear and culture to assess the characteristics of her sputum especially the eosinophil count to rule out nonasthmatic eosinophilic bronchitis.  2. Gastroesophageal reflux disease without esophagitis Patient has had prior imaging that has shown spasms in the distal third of the esophagus. These spasms cough possibly induce coughing. May need to invenstigate further and determine if there is a medication that could stop the spasms to see if the coughing stops or at least decreases if the esophagus is not having spasms.   3. Atherosclerosis of aorta (HCC) Taking lovastatin and ezetimibe. No recent echocardiogram or carotid ultrasound on record. Will discuss ordering both echo and carotid ultrasound at her next follow up visit in 4  weeks.   4. Essential hypertension Blood pressure is elevated., will continue to monitor, patient reports  she was nervous about the office visit today which may contribute to an increase in BP. If he blood pressure remains elevated at her next office visit, will adjust medications accordingly.    General Counseling: dorea duff understanding of the findings of todays visit and agrees with plan of treatment. I have discussed any further diagnostic evaluation that may be needed or ordered today. We also reviewed her medications today. she has been encouraged to call the office with any questions or concerns that should arise related to todays visit.    Orders Placed This Encounter  Procedures   DG Chest 2 View     No orders of the defined types were placed in this encounter.   Return in about 4 weeks (around 10/28/2020) for F/U chronic cough , Tamani Durney PCP.   Total time spent:30 Minutes Time spent includes review of chart, medications, test results, and follow up plan with the patient.   [place] Controlled Substance Database was reviewed by me.  This patient was seen by Jonetta Osgood, FNP-C in collaboration with Dr. Clayborn Bigness as a part of collaborative care agreement.   Sharisa Toves R. Valetta Fuller, MSN, FNP-C Internal medicine

## 2020-10-07 ENCOUNTER — Other Ambulatory Visit: Payer: Self-pay

## 2020-10-07 ENCOUNTER — Inpatient Hospital Stay: Payer: Medicare HMO | Attending: Oncology

## 2020-10-07 ENCOUNTER — Encounter: Payer: Self-pay | Admitting: Oncology

## 2020-10-07 ENCOUNTER — Inpatient Hospital Stay (HOSPITAL_BASED_OUTPATIENT_CLINIC_OR_DEPARTMENT_OTHER): Payer: Medicare HMO | Admitting: Oncology

## 2020-10-07 VITALS — BP 146/86 | HR 86 | Temp 97.9°F | Resp 20 | Wt 159.9 lb

## 2020-10-07 DIAGNOSIS — R1012 Left upper quadrant pain: Secondary | ICD-10-CM | POA: Insufficient documentation

## 2020-10-07 DIAGNOSIS — Z818 Family history of other mental and behavioral disorders: Secondary | ICD-10-CM | POA: Diagnosis not present

## 2020-10-07 DIAGNOSIS — Z87891 Personal history of nicotine dependence: Secondary | ICD-10-CM | POA: Diagnosis not present

## 2020-10-07 DIAGNOSIS — Z9221 Personal history of antineoplastic chemotherapy: Secondary | ICD-10-CM | POA: Insufficient documentation

## 2020-10-07 DIAGNOSIS — Z888 Allergy status to other drugs, medicaments and biological substances status: Secondary | ICD-10-CM | POA: Insufficient documentation

## 2020-10-07 DIAGNOSIS — Z85828 Personal history of other malignant neoplasm of skin: Secondary | ICD-10-CM | POA: Diagnosis not present

## 2020-10-07 DIAGNOSIS — I10 Essential (primary) hypertension: Secondary | ICD-10-CM | POA: Diagnosis not present

## 2020-10-07 DIAGNOSIS — K760 Fatty (change of) liver, not elsewhere classified: Secondary | ICD-10-CM | POA: Insufficient documentation

## 2020-10-07 DIAGNOSIS — C182 Malignant neoplasm of ascending colon: Secondary | ICD-10-CM | POA: Diagnosis not present

## 2020-10-07 DIAGNOSIS — R197 Diarrhea, unspecified: Secondary | ICD-10-CM | POA: Diagnosis not present

## 2020-10-07 DIAGNOSIS — Z8249 Family history of ischemic heart disease and other diseases of the circulatory system: Secondary | ICD-10-CM | POA: Diagnosis not present

## 2020-10-07 DIAGNOSIS — Z800 Family history of malignant neoplasm of digestive organs: Secondary | ICD-10-CM | POA: Insufficient documentation

## 2020-10-07 DIAGNOSIS — Z9049 Acquired absence of other specified parts of digestive tract: Secondary | ICD-10-CM | POA: Insufficient documentation

## 2020-10-07 DIAGNOSIS — Z808 Family history of malignant neoplasm of other organs or systems: Secondary | ICD-10-CM | POA: Diagnosis not present

## 2020-10-07 DIAGNOSIS — E785 Hyperlipidemia, unspecified: Secondary | ICD-10-CM | POA: Insufficient documentation

## 2020-10-07 DIAGNOSIS — E119 Type 2 diabetes mellitus without complications: Secondary | ICD-10-CM | POA: Diagnosis not present

## 2020-10-07 DIAGNOSIS — K633 Ulcer of intestine: Secondary | ICD-10-CM | POA: Insufficient documentation

## 2020-10-07 DIAGNOSIS — Z79899 Other long term (current) drug therapy: Secondary | ICD-10-CM | POA: Insufficient documentation

## 2020-10-07 DIAGNOSIS — G629 Polyneuropathy, unspecified: Secondary | ICD-10-CM | POA: Insufficient documentation

## 2020-10-07 DIAGNOSIS — K621 Rectal polyp: Secondary | ICD-10-CM | POA: Diagnosis not present

## 2020-10-07 LAB — COMPREHENSIVE METABOLIC PANEL
ALT: 27 U/L (ref 0–44)
AST: 21 U/L (ref 15–41)
Albumin: 4.3 g/dL (ref 3.5–5.0)
Alkaline Phosphatase: 41 U/L (ref 38–126)
Anion gap: 6 (ref 5–15)
BUN: 23 mg/dL (ref 8–23)
CO2: 27 mmol/L (ref 22–32)
Calcium: 9.5 mg/dL (ref 8.9–10.3)
Chloride: 107 mmol/L (ref 98–111)
Creatinine, Ser: 0.96 mg/dL (ref 0.44–1.00)
GFR, Estimated: >60 mL/min (ref >60)
Glucose, Bld: 141 mg/dL — ABNORMAL HIGH (ref 70–99)
Potassium: 4.1 mmol/L (ref 3.5–5.1)
Sodium: 140 mmol/L (ref 135–145)
Total Bilirubin: 0.3 mg/dL (ref 0.3–1.2)
Total Protein: 7.6 g/dL (ref 6.5–8.1)

## 2020-10-07 LAB — CBC WITH DIFFERENTIAL/PLATELET
Abs Immature Granulocytes: 0.02 10*3/uL (ref 0.00–0.07)
Basophils Absolute: 0 10*3/uL (ref 0.0–0.1)
Basophils Relative: 0 %
Eosinophils Absolute: 0.3 10*3/uL (ref 0.0–0.5)
Eosinophils Relative: 4 %
HCT: 44.9 % (ref 36.0–46.0)
Hemoglobin: 14.5 g/dL (ref 12.0–15.0)
Immature Granulocytes: 0 %
Lymphocytes Relative: 27 %
Lymphs Abs: 2 10*3/uL (ref 0.7–4.0)
MCH: 27.3 pg (ref 26.0–34.0)
MCHC: 32.3 g/dL (ref 30.0–36.0)
MCV: 84.4 fL (ref 80.0–100.0)
Monocytes Absolute: 0.9 10*3/uL (ref 0.1–1.0)
Monocytes Relative: 13 %
Neutro Abs: 4 10*3/uL (ref 1.7–7.7)
Neutrophils Relative %: 56 %
Platelets: 195 10*3/uL (ref 150–400)
RBC: 5.32 MIL/uL — ABNORMAL HIGH (ref 3.87–5.11)
RDW: 14 % (ref 11.5–15.5)
WBC: 7.3 10*3/uL (ref 4.0–10.5)
nRBC: 0 % (ref 0.0–0.2)

## 2020-10-07 NOTE — Progress Notes (Signed)
Pt states she is "wobbly" all of the time. However, PCP is having her take a test to have her spit up tested to see if she suffers from bronchial issues due to her unbalancing. Pt would like to know if she can start "goli diet pills" because she is eating a lot.

## 2020-10-08 LAB — CEA: CEA: 2.5 ng/mL (ref 0.0–4.7)

## 2020-10-09 NOTE — Progress Notes (Signed)
Hematology Oncology Progress Note   Clinic Day:  10/09/2020  Referring physician: Ronnell Freshwater, NP  Chief Complaint: Tiffany Hawkins is a 75 y.o. female with a history of stage IIB colon cancer presents for follow up .   PERTINENT ONCOLOGY HISTORY Tiffany Hawkins is a 75 y.o.afemale who has above oncology history reviewed by me today presented for follow up visit  Patient previously followed up by Dr.Corcoran, patient switched care to me on 10/09/20 Extensive medical record review was performed by me   Stage IIB colon cancer -T4aN0M0.  09/29/2011. colonoscopy for routine screening. She was noted to have a cecal polyp.  Pathology revealed minute fragmented specimen with features of at least intramucosal adenocarcinoma. Definite invasion was not identified. There were multiple other polyps (hyperplastic) in the ascending colon, hepatic flexure, transverse colon and splenic flexure. There was a tubular adenoma in the descending colon, a tubulovillous adenoma with focal high-grade dysplasia in the sigmoid colon, and a hyperplastic rectal polyp.  10/24/2011 laparoscopic assisted right colectomy. Pathology revealed a low-grade, moderately differentiated 1.4 cm invasive adenocarcinoma with invasion through the muscularis propria and present at the inked serosal surface. There was lymphovascular invasion but no perineural invasion. There were 18 benign lymph nodes.  There was a tubular adenoma, mixed tubulovillous and sessile serrated adenoma, sessile serrated adenoma, hyperplastic polyp and ileocecal valve lipoma. Pathologic stage was T4aN0M0.  She underwent adjuvant chemotherapy, possibly FOLFOX. Unclear about radiation therapy.  Treatment completed in 06/2012. She notes that she had diarrhea with her treatment. 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 She denied nausea or vomiting.  She completed genetic testing. Results are unavailable to [place] at the time of the visit.   Follow up  surveillance  06/22/2016  Abdomen and pelvis CT revealed stable bibasilar subpleural pulmonary nodules since 2016 cystoscopy with benign findings.  There was fatty infiltration of the liver.  There was simple bilateral renal cyst.  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  She is s/p right hemicolectomy without focal abnormality.  05/17/2015 Colonoscopy by Dr Gaylyn Cheers revealed a sessile serrated adenoma in the transverse colon mild active colitis with adjacent ulcer bed in the transverse colon. Pathology was negative for viral cytopathic effect, dysplasia and malignancy.   04/01/2018 Colonoscopy by Dr [name] revealed diverticulosis in the sigmoid and descending colon. There was a 5 mm polyp in the sigmoid colon.  Pathology revealed a tubular adenoma in the sigmoid colon. Repeat colonoscopy was planned in 5 years for surveillance.  CEA has been followed: 2.1 on 03/03/2015, 2.3 on 08/27/2015, 2.5 on 03/03/2016, 1.81 on 07/07/2016, 2.3 on 09/01/2016, 2.2 on 02/16/2017, 2.4 on 08/29/2017, and 2.5 on 03/01/2018.  INTERVAL HISTORY Tiffany Hawkins is a 75 y.o. female who has above history reviewed by me today presents for follow up visit for history of Stage IIB colon cancer.  She reports feeling well. No new complaints.  Denies hematemesis, black or tarry stool.  Appetite is fair and weight is stable.  "Wobbling" which is chronic for her.    Past Medical History:  Diagnosis Date   Anxiety    Colon cancer ([place]) 10/12/2014   Stage IIB; had chemo   Diabetes mellitus without complication (HCC)    Hyperlipidemia    Hypertension    Lateral epicondylitis    Personal history of chemotherapy    Polio    childhood   Skin cancer    Sqamous Cell, Lt Calf  Sleep apnea    no CPAP, sx improved with wt loss    Past Surgical History:  Procedure Laterality Date   APPENDECTOMY     BACK SURGERY     CATARACT EXTRACTION W/PHACO  Left 12/03/2017   Procedure: CATARACT EXTRACTION PHACO AND INTRAOCULAR LENS PLACEMENT ([place]) LEFT  DIABETIC;  Surgeon: Eulogio Bear, MD;  Location: [facility];  Service: Ophthalmology;  Laterality: Left;  Diabetic - insulin   CATARACT EXTRACTION W/PHACO Right 01/14/2018   Procedure: CATARACT EXTRACTION PHACO AND INTRAOCULAR LENS PLACEMENT ([facility]) RIGHT DIABETIC;  Surgeon: Eulogio Bear, MD;  Location: [facility];  Service: Ophthalmology;  Laterality: Right;  Diabetic - insulin   CERVICAL DISC SURGERY     CHOLECYSTECTOMY     COLON SURGERY     COLONOSCOPY     COLONOSCOPY WITH PROPOFOL N/A 05/17/2015   Procedure: COLONOSCOPY WITH PROPOFOL;  Surgeon: Manya Silvas, MD;  Location: [facility]COPY;  Service: Endoscopy;  Laterality: N/A;   COLONOSCOPY WITH PROPOFOL N/A 04/01/2018   Procedure: COLONOSCOPY WITH BIOPSY;  Surgeon: [name], MD;  Location: [facility];  Service: Endoscopy;  Laterality: N/A;  Diabetic - insulin   ESOPHAGOGASTRODUODENOSCOPY     HERNIA REPAIR     POLYPECTOMY N/A 04/01/2018   Procedure: POLYPECTOMY;  Surgeon: [name], MD;  Location: [facility];  Service: Endoscopy;  Laterality: N/A;   PORT A CATH INJECTION ([place] [facility])     Port has been removed   TUBAL LIGATION      Family History  Problem Relation Age of Onset   Alzheimer's disease Mother    Heart disease Father    Colon cancer Cousin    Colon cancer Cousin    Cancer Cousin        Brain Tumor   Breast cancer Neg Hx     Social History:  reports that she quit smoking about 39 years ago. Her smoking use included cigarettes. She has a 30.00 pack-year smoking history. She has never used smokeless tobacco. She reports that she does not drink alcohol and does not use drugs. She has two children. She has 5 great great grandchildren, all who live close to her. Her husband's name is Dominica Severin.  She lives in [place].  The patient is alone today.   Allergies:  Allergies  Allergen  Reactions   Lisinopril Cough    Current Medications: Current Outpatient Medications  Medication Sig Dispense Refill   albuterol (VENTOLIN HFA) 108 (90 Base) MCG/ACT inhaler Inhale 2 puffs into the lungs every 6 (six) hours as needed for wheezing or shortness of breath. 8 g 0   amLODipine (NORVASC) 5 MG tablet TAKE 2 TABLETS DAILY. 180 tablet 1   Blood Glucose Monitoring Suppl (TRUE METRIX METER) w/Device KIT USE AS DIRECTED 1 kit 0   cetirizine (ZYRTEC) 10 MG chewable tablet Chew 1 tablet (10 mg total) by mouth daily. 90 tablet 1   chlorpheniramine-HYDROcodone (TUSSIONEX PENNKINETIC ER) 10-8 MG/5ML SUER Take 5 mLs by mouth at bedtime as needed for cough. 140 mL 0   citalopram (CELEXA) 20 MG tablet TAKE 2 TABLETS DAILY FOR DEPRESSION. 180 tablet 1   Dapagliflozin Propanediol (FARXIGA PO) Take by mouth.     fenofibrate 54 MG tablet Take 1 tablet (54 mg total) by mouth daily. 90 tablet 1   fluticasone (FLONASE) 50 MCG/ACT nasal spray Place 2 sprays into both nostrils daily. 48 g 1   ibuprofen (ADVIL) 800 MG tablet Take 1 tablet (800 mg total)  by mouth every 8 (eight) hours as needed. 90 tablet 1   insulin aspart protamine - aspart (NOVOLOG MIX 70/30 FLEXPEN) (70-30) 100 UNIT/ML FlexPen Inject 0.35 mLs (35 Units total) into the skin 2 (two) times daily. Inject 35 units [place] QAM. Gradually increase to 40units [place] QPM 15 mL 11   lovastatin (MEVACOR) 20 MG tablet TAKE 1 TABLET AT BEDTIME FOR HIGH CHOLESTEROL 90 tablet 1   Multiple Vitamin (MULTIVITAMIN) tablet Take by mouth daily. TAKES 1/2 TABLET     pantoprazole (PROTONIX) 20 MG tablet Take 2 tablets (40 mg total) by mouth daily. 90 tablet 1   sharps container 1 each by Does not apply route as needed. To use for insulin syringes and needles. Taking insulin at least twice daily.  E11.65 1 each 5   TRUE METRIX BLOOD GLUCOSE TEST test strip TEST BLOOD SUGAR THREE TIMES DAILY AFTER MEALS 200 strip 3   TRUEplus Lancets 33G MISC Use as directed TID for  glucose test  Dx E11.65 100 each 3   No current facility-administered medications for this visit.    Review of Systems  Constitutional:  Negative for chills, diaphoresis, fever, malaise/fatigue and weight loss.  HENT:  Negative for congestion and sore throat.   Eyes:  Positive for blurred vision (cataracts). Negative for double vision.  Respiratory:  Negative for cough, sputum production and shortness of breath (with exertion).   Cardiovascular:  Negative for chest pain, palpitations and leg swelling.  Gastrointestinal:  Negative for blood in stool, constipation, diarrhea, heartburn, nausea and vomiting.  Genitourinary:  Negative for frequency and urgency.       Mild incontinence  Musculoskeletal:  Positive for back pain and joint pain (knees). Negative for falls and myalgias.  Skin:  Negative for rash.  Neurological:  Negative for dizziness, sensory change, weakness (legs, ambulates with cane) and headaches.       Balance issues  Endo/Heme/Allergies:  Does not bruise/bleed easily.  Psychiatric/Behavioral:  Negative for depression. The patient is not nervous/anxious.     Performance status (ECOG): 1 Vitals Blood pressure (!) 146/86, pulse 86, temperature 97.9 F (36.6 C), resp. rate 20, weight 159 lb 15.1 oz (72.6 kg), SpO2 96 %.   Physical Exam Vitals and nursing note reviewed.  Constitutional:      General: She is not in acute distress.    Appearance: Normal appearance. She is well-developed.     Interventions: Face mask in place.  HENT:     Head: Normocephalic and atraumatic.     Mouth/Throat:     Mouth: No oral lesions.  Eyes:     General: No scleral icterus.    Pupils: Pupils are equal, round, and reactive to light.  Neck:     Vascular: No JVD.  Cardiovascular:     Rate and Rhythm: Normal rate and regular rhythm.     Heart sounds: Normal heart sounds. No murmur heard.   No friction rub. No gallop.  Pulmonary:     Effort: Pulmonary effort is normal.     Breath  sounds: Normal breath sounds. No wheezing, rhonchi or rales.  Chest:  Breasts:    Right: No supraclavicular adenopathy.     Left: No supraclavicular adenopathy.  Abdominal:     General: Bowel sounds are normal. There is no distension.     Palpations: Abdomen is soft.     Tenderness: There is abdominal tenderness (right mid-lower quadrant).  Musculoskeletal:        General: No tenderness. Normal range of  motion.     Cervical back: Normal range of motion and neck supple.  Lymphadenopathy:     Head:     Right side of head: No preauricular, posterior auricular or occipital adenopathy.     Left side of head: No preauricular, posterior auricular or occipital adenopathy.     Cervical: No cervical adenopathy.     Upper Body:     Right upper body: No supraclavicular adenopathy.     Left upper body: No supraclavicular adenopathy.     Lower Body: No right inguinal adenopathy. No left inguinal adenopathy.  Skin:    General: Skin is warm and dry.     Coloration: Skin is not pale.     Findings: No bruising, erythema, lesion or rash.  Neurological:     Mental Status: She is alert and oriented to person, place, and time.  Psychiatric:        Mood and Affect: Mood normal.    Laboratory data Appointment on 10/07/2020  Component Date Value Ref Range Status   Sodium 10/07/2020 140  135 - 145 mmol/L Final   Potassium 10/07/2020 4.1  3.5 - 5.1 mmol/L Final   Chloride 10/07/2020 107  98 - 111 mmol/L Final   CO2 10/07/2020 27  22 - 32 mmol/L Final   Glucose, Bld 10/07/2020 141 (A) 70 - 99 mg/dL Final   Glucose reference range applies only to samples taken after fasting for at least 8 hours.   BUN 10/07/2020 23  8 - 23 mg/dL Final   Creatinine, Ser 10/07/2020 0.96  0.44 - 1.00 mg/dL Final   Calcium 10/07/2020 9.5  8.9 - 10.3 mg/dL Final   Total Protein 10/07/2020 7.6  6.5 - 8.1 g/dL Final   Albumin 10/07/2020 4.3  3.5 - 5.0 g/dL Final   AST 10/07/2020 21  15 - 41 U/L Final   ALT 10/07/2020 27  0  - 44 U/L Final   Alkaline Phosphatase 10/07/2020 41  38 - 126 U/L Final   Total Bilirubin 10/07/2020 0.3  0.3 - 1.2 mg/dL Final   GFR, Estimated 10/07/2020 >60  >60 mL/min Final   Comment: (NOTE) Calculated using the CKD-EPI Creatinine Equation (2021)    Anion gap 10/07/2020 6  5 - 15 Final   Performed at [facility], [address] [place], [place] [address]   WBC 10/07/2020 7.3  4.0 - 10.5 K/uL Final   RBC 10/07/2020 5.32 (A) 3.87 - 5.11 MIL/uL Final   Hemoglobin 10/07/2020 14.5  12.0 - 15.0 g/dL Final   HCT 10/07/2020 44.9  36.0 - 46.0 % Final   MCV 10/07/2020 84.4  80.0 - 100.0 fL Final   MCH 10/07/2020 27.3  26.0 - 34.0 pg Final   MCHC 10/07/2020 32.3  30.0 - 36.0 g/dL Final   RDW 10/07/2020 14.0  11.5 - 15.5 % Final   Platelets 10/07/2020 195  150 - 400 K/uL Final   nRBC 10/07/2020 0.0  0.0 - 0.2 % Final   Neutrophils Relative % 10/07/2020 56  % Final   Neutro Abs 10/07/2020 4.0  1.7 - 7.7 K/uL Final   Lymphocytes Relative 10/07/2020 27  % Final   Lymphs Abs 10/07/2020 2.0  0.7 - 4.0 K/uL Final   Monocytes Relative 10/07/2020 13  % Final   Monocytes Absolute 10/07/2020 0.9  0.1 - 1.0 K/uL Final   Eosinophils Relative 10/07/2020 4  % Final   Eosinophils Absolute 10/07/2020 0.3  0.0 - 0.5 K/uL Final   Basophils  Relative 10/07/2020 0  % Final   Basophils Absolute 10/07/2020 0.0  0.0 - 0.1 K/uL Final   Immature Granulocytes 10/07/2020 0  % Final   Abs Immature Granulocytes 10/07/2020 0.02  0.00 - 0.07 K/uL Final   Performed at [facility], [address], [place], [place] [address]   CEA 10/07/2020 2.5  0.0 - 4.7 ng/mL Final   Comment: (NOTE)                             Nonsmokers          <3.9                             Smokers             <5.6 [employer] Electrochemiluminescence Immunoassay (ECLIA) Values obtained with different assay methods or kits cannot be used interchangeably.  Results cannot be interpreted as absolute evidence of  the presence or absence of malignant disease. Performed At: [facility] [place], [place] [id number] Rush Farmer MD JQ:[id number]     Assessment and plan:   1. Cancer of ascending colon ([place])   2. Abdominal pain, left upper quadrant     #Stage IIB colon cancer- 2013, s/p adjuvant chemotherapy. She is doing well clinically.  It has been 9 years since her surgery.  CEA has been followed and is within normal limit.  No pre-operation baseline CEA level available.   # Right mid-lower quadrant tenderness CT abdomen pelvis w contrast for evaluation.  If CT result is negative for recurrence, will obtain additional surveillance scan.   RTC in 1 year for MD assessment and labs (CBC with diff, CMP, CEA).   I discussed the assessment and treatment plan with the patient.  The patient was provided an opportunity to ask questions and all were answered.  The patient agreed with the plan and demonstrated an understanding of the instructions.  The patient was advised to call back if the symptoms worsen or if the condition fails to improve as anticipated.   We spent sufficient time to discuss many aspect of care, questions were answered to patient's satisfaction.  Earlie Server, MD, PhD Hematology Oncology [place] [facility] 10/09/2020

## 2020-10-11 ENCOUNTER — Other Ambulatory Visit: Payer: Self-pay | Admitting: Nurse Practitioner

## 2020-10-12 ENCOUNTER — Telehealth: Payer: Self-pay

## 2020-10-12 NOTE — Telephone Encounter (Signed)
Pt advised we have samples for farxiga 10 mg ready for pickup

## 2020-10-13 ENCOUNTER — Other Ambulatory Visit: Payer: Self-pay

## 2020-10-13 MED ORDER — LOVASTATIN 20 MG PO TABS: ORAL_TABLET | ORAL | 1 refills | Status: DC

## 2020-10-18 DIAGNOSIS — G4733 Obstructive sleep apnea (adult) (pediatric): Secondary | ICD-10-CM | POA: Diagnosis not present

## 2020-10-19 ENCOUNTER — Ambulatory Visit
Admission: RE | Admit: 2020-10-19 | Discharge: 2020-10-19 | Disposition: A | Discharge status: Home / Self Care | Payer: Medicare HMO | Source: Ambulatory Visit | Attending: Oncology | Admitting: Oncology

## 2020-10-19 ENCOUNTER — Other Ambulatory Visit: Payer: Self-pay | Admitting: Nurse Practitioner

## 2020-10-19 ENCOUNTER — Other Ambulatory Visit: Payer: Self-pay

## 2020-10-19 DIAGNOSIS — R1012 Left upper quadrant pain: Secondary | ICD-10-CM | POA: Diagnosis not present

## 2020-10-19 DIAGNOSIS — C182 Malignant neoplasm of ascending colon: Secondary | ICD-10-CM | POA: Insufficient documentation

## 2020-10-19 DIAGNOSIS — J8289 Other pulmonary eosinophilia, not elsewhere classified: Secondary | ICD-10-CM | POA: Diagnosis not present

## 2020-10-19 DIAGNOSIS — R109 Unspecified abdominal pain: Secondary | ICD-10-CM | POA: Diagnosis not present

## 2020-10-19 MED ORDER — IOHEXOL 300 MG/ML  SOLN
100.0000 mL | Freq: Once | INTRAMUSCULAR | Status: AC | PRN
  Administered 2020-10-19: 100 mL via INTRAVENOUS

## 2020-10-21 DIAGNOSIS — G4733 Obstructive sleep apnea (adult) (pediatric): Secondary | ICD-10-CM | POA: Diagnosis not present

## 2020-10-24 LAB — SPUTUM CULTURE

## 2020-10-24 LAB — GRAM STAIN W/SPUTUM CULT RFLX

## 2020-10-25 ENCOUNTER — Other Ambulatory Visit: Payer: Self-pay | Admitting: Internal Medicine

## 2020-10-25 ENCOUNTER — Telehealth: Payer: Self-pay

## 2020-10-25 DIAGNOSIS — L7211 Pilar cyst: Secondary | ICD-10-CM | POA: Diagnosis not present

## 2020-10-25 DIAGNOSIS — Z872 Personal history of diseases of the skin and subcutaneous tissue: Secondary | ICD-10-CM | POA: Diagnosis not present

## 2020-10-25 DIAGNOSIS — Z859 Personal history of malignant neoplasm, unspecified: Secondary | ICD-10-CM | POA: Diagnosis not present

## 2020-10-25 DIAGNOSIS — C182 Malignant neoplasm of ascending colon: Secondary | ICD-10-CM

## 2020-10-25 DIAGNOSIS — L578 Other skin changes due to chronic exposure to nonionizing radiation: Secondary | ICD-10-CM | POA: Diagnosis not present

## 2020-10-25 DIAGNOSIS — L680 Hirsutism: Secondary | ICD-10-CM | POA: Diagnosis not present

## 2020-10-25 DIAGNOSIS — L570 Actinic keratosis: Secondary | ICD-10-CM | POA: Diagnosis not present

## 2020-10-25 NOTE — Telephone Encounter (Signed)
Patient informed and voiced understanding

## 2020-10-25 NOTE — Telephone Encounter (Signed)
-----   Message from Earlie Server, MD sent at 10/23/2020 11:59 AM EDT ----- Let patient know that CT showed no acute process to explain her left lower quadrant pain. 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 Recommend her to see gyn for further evaluation.  Follow up plan, lab md cbc cmp cea prior MD visit in 1 year.

## 2020-11-01 ENCOUNTER — Other Ambulatory Visit: Payer: Self-pay

## 2020-11-01 ENCOUNTER — Encounter: Payer: Self-pay | Admitting: Physician Assistant

## 2020-11-01 ENCOUNTER — Ambulatory Visit (INDEPENDENT_AMBULATORY_CARE_PROVIDER_SITE_OTHER): Payer: Medicare HMO | Admitting: Physician Assistant

## 2020-11-01 DIAGNOSIS — E119 Type 2 diabetes mellitus without complications: Secondary | ICD-10-CM

## 2020-11-01 DIAGNOSIS — I10 Essential (primary) hypertension: Secondary | ICD-10-CM | POA: Diagnosis not present

## 2020-11-01 DIAGNOSIS — I700 Atherosclerosis of aorta: Secondary | ICD-10-CM | POA: Diagnosis not present

## 2020-11-01 DIAGNOSIS — M79671 Pain in right foot: Secondary | ICD-10-CM

## 2020-11-01 DIAGNOSIS — M79672 Pain in left foot: Secondary | ICD-10-CM

## 2020-11-01 DIAGNOSIS — N9489 Other specified conditions associated with female genital organs and menstrual cycle: Secondary | ICD-10-CM

## 2020-11-01 DIAGNOSIS — Z23 Encounter for immunization: Secondary | ICD-10-CM

## 2020-11-01 DIAGNOSIS — R0989 Other specified symptoms and signs involving the circulatory and respiratory systems: Secondary | ICD-10-CM

## 2020-11-01 DIAGNOSIS — K219 Gastro-esophageal reflux disease without esophagitis: Secondary | ICD-10-CM

## 2020-11-01 DIAGNOSIS — Z87898 Personal history of other specified conditions: Secondary | ICD-10-CM | POA: Diagnosis not present

## 2020-11-01 DIAGNOSIS — Z794 Long term (current) use of insulin: Secondary | ICD-10-CM

## 2020-11-01 MED ORDER — PNEUMOCOCCAL 20-VAL CONJ VACC 0.5 ML IM SUSY: 0.5000 mL | PREFILLED_SYRINGE | INTRAMUSCULAR | 0 refills | Status: AC

## 2020-11-01 MED ORDER — ZOSTER VAC RECOMB ADJUVANTED 50 MCG/0.5ML IM SUSR: 0.5000 mL | Freq: Once | INTRAMUSCULAR | 0 refills | Status: AC

## 2020-11-01 NOTE — Progress Notes (Signed)
[facility] [place], [place] [address]  Internal MEDICINE  Office Visit Note  Patient Name: Tiffany Hawkins  [id number]  [id number]  Date of Service: 11/07/2020  Chief Complaint  Patient presents with   Follow-up    Balance, weight, energy   Diabetes   Sleep Apnea   Hyperlipidemia   Hypertension   Anxiety    HPI Pt is here for routine follow up -Cough has improved some, but still present at times. -Balance issues for years -SOB with walking long distance, tired going up and down stairs -Has stationary pedals for exercise -Discussed checking carotid US and echo--would like to start with carotids first -Had  CT abd/pelvis with oncology 10/19/20:  IMPRESSION:  1. A specific cause for the patient's left lower quadrant pain is  not identified.  2. There is some continued prominence of left periuterine  vasculature. This can sometimes be associated with pelvic congestion  syndrome.  3. Colonic diverticulosis but no active diverticulitis identified.  4. Other imaging findings of potential clinical significance: Small  bibasilar pulmonary nodules are chronically unchanged and considered  benign. Aortic Atherosclerosis (ICD10-I70.0). Coronary  atherosclerosis. Renal cysts. Right hemicolectomy.   -She was told to follow up with OBGYN for finding of pelvic congestion syndrome, but states she doesn't have one and has not done this--will refer today  Current Medication: Outpatient Encounter Medications as of 11/01/2020  Medication Sig   albuterol (VENTOLIN HFA) 108 (90 Base) MCG/ACT inhaler Inhale 2 puffs into the lungs every 6 (six) hours as needed for wheezing or shortness of breath.   amLODipine (NORVASC) 5 MG tablet TAKE 2 TABLETS DAILY.   Blood Glucose Monitoring Suppl (TRUE METRIX METER) w/Device KIT USE AS DIRECTED   cetirizine (ZYRTEC) 10 MG chewable tablet Chew 1 tablet (10 mg total) by mouth daily.   chlorpheniramine-HYDROcodone (TUSSIONEX  PENNKINETIC ER) 10-8 MG/5ML SUER Take 5 mLs by mouth at bedtime as needed for cough.   citalopram (CELEXA) 20 MG tablet TAKE 2 TABLETS DAILY FOR DEPRESSION.   Dapagliflozin Propanediol (FARXIGA PO) Take by mouth.   fenofibrate 54 MG tablet Take 1 tablet (54 mg total) by mouth daily.   fluticasone (FLONASE) 50 MCG/ACT nasal spray Place 2 sprays into both nostrils daily.   ibuprofen (ADVIL) 800 MG tablet Take 1 tablet (800 mg total) by mouth every 8 (eight) hours as needed.   insulin aspart protamine - aspart (NOVOLOG MIX 70/30 FLEXPEN) (70-30) 100 UNIT/ML FlexPen Inject 0.35 mLs (35 Units total) into the skin 2 (two) times daily. Inject 35 units [place] QAM. Gradually increase to 40units  QPM   lovastatin (MEVACOR) 20 MG tablet TAKE 1 TABLET AT BEDTIME FOR HIGH CHOLESTEROL   Multiple Vitamin (MULTIVITAMIN) tablet Take by mouth daily. TAKES 1/2 TABLET   pantoprazole (PROTONIX) 20 MG tablet Take 2 tablets (40 mg total) by mouth daily.   sharps container 1 each by Does not apply route as needed. To use for insulin syringes and needles. Taking insulin at least twice daily.  E11.65   spironolactone (ALDACTONE) 100 MG tablet Take 100 mg by mouth daily. 2 tablets once a day   TRUE METRIX BLOOD GLUCOSE TEST test strip TEST BLOOD SUGAR THREE TIMES DAILY AFTER MEALS   TRUEplus Lancets 33G MISC USE AS DIRECTED TO TEST BLOOD SUGAR THREE TIMES DAILY   [DISCONTINUED] pneumococcal 20-Val Conj Vacc (PREVNAR 20) 0.5 ML SUSY Inject 0.5 mLs into the muscle tomorrow at 10 am.   [DISCONTINUED] Zoster Vaccine Adjuvanted [facility]) injection  Inject 0.5 mLs into the muscle once.   [EXPIRED] pneumococcal 20-Val Conj Vacc (PREVNAR 20) 0.5 ML SUSY Inject 0.5 mLs into the muscle tomorrow at 10 am for 1 dose.   [EXPIRED] Zoster Vaccine Adjuvanted [facility]) injection Inject 0.5 mLs into the muscle once for 1 dose.   No facility-administered encounter medications on file as of 11/01/2020.    Surgical History: Past Surgical  History:  Procedure Laterality Date   APPENDECTOMY     BACK SURGERY     CATARACT EXTRACTION W/PHACO Left 12/03/2017   Procedure: CATARACT EXTRACTION PHACO AND INTRAOCULAR LENS PLACEMENT ([place]) LEFT  DIABETIC;  Surgeon: Eulogio Bear, MD;  Location: [place];  Service: Ophthalmology;  Laterality: Left;  Diabetic - insulin   CATARACT EXTRACTION W/PHACO Right 01/14/2018   Procedure: CATARACT EXTRACTION PHACO AND INTRAOCULAR LENS PLACEMENT ([facility]) RIGHT DIABETIC;  Surgeon: Eulogio Bear, MD;  Location: [place];  Service: Ophthalmology;  Laterality: Right;  Diabetic - insulin   CERVICAL DISC SURGERY     CHOLECYSTECTOMY     COLON SURGERY     COLONOSCOPY     COLONOSCOPY WITH PROPOFOL N/A 05/17/2015   Procedure: COLONOSCOPY WITH PROPOFOL;  Surgeon: Manya Silvas, MD;  Location: [facility] ENDOSCOPY;  Service: Endoscopy;  Laterality: N/A;   COLONOSCOPY WITH PROPOFOL N/A 04/01/2018   Procedure: COLONOSCOPY WITH BIOPSY;  Surgeon: [name], MD;  Location: [place];  Service: Endoscopy;  Laterality: N/A;  Diabetic - insulin   ESOPHAGOGASTRODUODENOSCOPY     HERNIA REPAIR     POLYPECTOMY N/A 04/01/2018   Procedure: POLYPECTOMY;  Surgeon: [name], MD;  Location: [place];  Service: Endoscopy;  Laterality: N/A;   PORT A CATH INJECTION ([place] HX)     Port has been removed   TUBAL LIGATION      Medical History: Past Medical History:  Diagnosis Date   Anxiety    Colon cancer ([place]) 10/12/2014   Stage IIB; had chemo   Diabetes mellitus without complication (HCC)    Hyperlipidemia    Hypertension    Lateral epicondylitis    Personal history of chemotherapy    Polio    childhood   Skin cancer    Sqamous Cell, Lt Calf   Sleep apnea    no CPAP, sx improved with wt loss    Family History: Family History  Problem Relation Age of Onset   Alzheimer's disease Mother    Heart disease Father    Colon cancer Cousin    Colon cancer Cousin    Cancer  Cousin        Brain Tumor   Breast cancer Neg Hx     Social History   Socioeconomic History   Marital status: Married    Spouse name: Not on file   Number of children: Not on file   Years of education: Not on file   Highest education level: Not on file  Occupational History   Not on file  Tobacco Use   Smoking status: Former    Packs/day: 1.50    Years: 20.00    Pack years: 30.00    Types: Cigarettes    Quit date: 04/17/1981    Years since quitting: 39.5   Smokeless tobacco: Never  Vaping Use   Vaping Use: Never used  Substance and Sexual Activity   Alcohol use: No   Drug use: No   Sexual activity: Never  Other Topics Concern   Not on file  Social History Narrative  Not on file   Social Determinants of Health   Financial Resource Strain: Not on file  Food Insecurity: Not on file  Transportation Needs: Not on file  Physical Activity: Not on file  Stress: Not on file  Social Connections: Not on file  Intimate Partner Violence: Not on file      Review of Systems  Constitutional:  Positive for fatigue. Negative for chills and unexpected weight change.  HENT:  Negative for congestion, postnasal drip, rhinorrhea, sneezing and sore throat.   Eyes:  Negative for redness.  Respiratory:  Positive for cough and shortness of breath. Negative for chest tightness and wheezing.   Cardiovascular:  Negative for chest pain and palpitations.  Gastrointestinal:  Negative for abdominal pain, constipation, diarrhea, nausea and vomiting.  Genitourinary:  Negative for dysuria and frequency.  Musculoskeletal:  Negative for arthralgias, back pain, joint swelling and neck pain.  Skin:  Negative for rash.  Neurological: Negative.  Negative for tremors and numbness.  Hematological:  Negative for adenopathy. Does not bruise/bleed easily.  Psychiatric/Behavioral:  Negative for behavioral problems (Depression), sleep disturbance and suicidal ideas. The patient is not nervous/anxious.     Vital Signs: BP (!) 142/80   Pulse 90   Temp (!) 97.4 F (36.3 C)   Resp 16   Ht 5' 1.5" (1.562 m)   Wt 158 lb 6.4 oz (71.8 kg)   SpO2 97%   BMI 29.44 kg/m    Physical Exam Vitals and nursing note reviewed.  Constitutional:      General: She is not in acute distress.    Appearance: She is well-developed. She is not diaphoretic.  HENT:     Head: Normocephalic and atraumatic.     Mouth/Throat:     Pharynx: No oropharyngeal exudate.  Eyes:     Pupils: Pupils are equal, round, and reactive to light.  Neck:     Thyroid: No thyromegaly.     Vascular: No JVD.     Trachea: No tracheal deviation.  Cardiovascular:     Rate and Rhythm: Normal rate and regular rhythm.     Heart sounds: Normal heart sounds. No murmur heard.   No friction rub. No gallop.  Pulmonary:     Effort: Pulmonary effort is normal. No respiratory distress.     Breath sounds: No wheezing or rales.  Chest:     Chest wall: No tenderness.  Abdominal:     General: Bowel sounds are normal.     Palpations: Abdomen is soft.  Musculoskeletal:        General: Normal range of motion.     Cervical back: Normal range of motion and neck supple.  Lymphadenopathy:     Cervical: No cervical adenopathy.  Skin:    General: Skin is warm and dry.  Neurological:     Mental Status: She is alert and oriented to person, place, and time.     Cranial Nerves: No cranial nerve deficit.  Psychiatric:        Behavior: Behavior normal.        Thought Content: Thought content normal.        Judgment: Judgment normal.       Assessment/Plan: 1. Type 2 diabetes mellitus without complication, with long-term current use of insulin (HCC) Last A1C was 6.3, Continue to monitor. Will refer to podiatry due to increasing foot pain - Ambulatory referral to Podiatry  2. Essential hypertension Mildly elevated, will monitor closely  3. Bilateral carotid bruits Will order carotid US for  further eval esp given coronary and aortic  atherosclerosis. Will likely need echo as well - US Carotid Duplex Bilateral; Future  4. Atherosclerosis of aorta (HCC) Continue statin  5. Pain in both feet - Ambulatory referral to Podiatry  6. Gastroesophageal reflux disease without esophagitis Continue PPI  7. H/O multiple pulmonary nodules Stable on recent CT, continue to monitor  8. 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 Need for shingles vaccine - Zoster Vaccine Adjuvanted [facility]) injection; Inject 0.5 mLs into the muscle once for 1 dose.  Dispense: 0.5 mL; Refill: 0  10. Need for vaccination for Strep pneumoniae - pneumococcal 20-Val Conj Vacc (PREVNAR 20) 0.5 ML SUSY; Inject 0.5 mLs into the muscle tomorrow at 10 am for 1 dose.  Dispense: 0.5 mL; Refill: 0   General Counseling: Ronette verbalizes understanding of the findings of todays visit and agrees with plan of treatment. I have discussed any further diagnostic evaluation that may be needed or ordered today. We also reviewed her medications today. she has been encouraged to call the office with any questions or concerns that should arise related to todays visit.    Orders Placed This Encounter  Procedures   US Carotid Duplex Bilateral   Ambulatory referral to Podiatry   Ambulatory referral to Obstetrics / Gynecology    Meds ordered this encounter  Medications   Zoster Vaccine Adjuvanted [facility]) injection    Sig: Inject 0.5 mLs into the muscle once for 1 dose.    Dispense:  0.5 mL    Refill:  0   pneumococcal 20-Val Conj Vacc (PREVNAR 20) 0.5 ML SUSY    Sig: Inject 0.5 mLs into the muscle tomorrow at 10 am for 1 dose.    Dispense:  0.5 mL    Refill:  0    This patient was seen by Drema Dallas, PA-C in collaboration with Dr. Clayborn Bigness as a part of collaborative care agreement.   Total time spent:40 Minutes Time spent includes review of chart, medications, test  results, and follow up plan with the patient.      Dr Lavera Guise Internal medicine

## 2020-11-03 ENCOUNTER — Ambulatory Visit (INDEPENDENT_AMBULATORY_CARE_PROVIDER_SITE_OTHER): Payer: Medicare HMO

## 2020-11-03 ENCOUNTER — Other Ambulatory Visit: Payer: Self-pay

## 2020-11-03 ENCOUNTER — Telehealth: Payer: Self-pay

## 2020-11-03 DIAGNOSIS — G4733 Obstructive sleep apnea (adult) (pediatric): Secondary | ICD-10-CM

## 2020-11-03 NOTE — Progress Notes (Signed)
95 percentile pressure 12.8   95th percentile leak 9.2   apnea index 2.0 /hr  apnea-hypopnea index  2.1 /hr   total days used  >4 hr 88 days  total days used <4 hr 0 days  Total compliance 98 percent   She is doing very good assured her she is doing great went over putting mask together.

## 2020-11-03 NOTE — Telephone Encounter (Signed)
Spoke with patient an inform her that her sputum culture was negative.LNB

## 2020-11-12 ENCOUNTER — Telehealth: Payer: Self-pay

## 2020-11-12 NOTE — Telephone Encounter (Signed)
Faxed Podiatry referral to [facility] per patient request at [phone].

## 2020-11-16 ENCOUNTER — Other Ambulatory Visit: Payer: Self-pay

## 2020-11-16 DIAGNOSIS — R519 Headache, unspecified: Secondary | ICD-10-CM

## 2020-11-16 MED ORDER — IBUPROFEN 800 MG PO TABS: 800.0000 mg | ORAL_TABLET | Freq: Three times a day (TID) | ORAL | 1 refills | Status: DC | PRN

## 2020-11-18 DIAGNOSIS — G4733 Obstructive sleep apnea (adult) (pediatric): Secondary | ICD-10-CM | POA: Diagnosis not present

## 2020-11-19 ENCOUNTER — Other Ambulatory Visit: Payer: Self-pay | Admitting: Nurse Practitioner

## 2020-11-24 ENCOUNTER — Telehealth: Payer: Self-pay

## 2020-11-24 NOTE — Telephone Encounter (Signed)
Pt called c/o having diarrhea for 7 days.  She has tried imodium and pepto and hasn't seemed to help.  Pt said some of the days it was a little better and other days she had it all day. Pt also informed me that she had fell twice once last Saturday 10/14/20 and again this past Saturday 11/20/20.   Pt informed me that she has been drinking a lot of fluids.  I informed the pt that we felt it was best that she go to the ER so she can be checked if she is dehydrated or if she has some other illness.  Pt seemed to understand instructions

## 2020-11-30 DIAGNOSIS — M216X1 Other acquired deformities of right foot: Secondary | ICD-10-CM | POA: Diagnosis not present

## 2020-11-30 DIAGNOSIS — M79671 Pain in right foot: Secondary | ICD-10-CM | POA: Diagnosis not present

## 2020-11-30 DIAGNOSIS — M21372 Foot drop, left foot: Secondary | ICD-10-CM | POA: Diagnosis not present

## 2020-11-30 DIAGNOSIS — M7671 Peroneal tendinitis, right leg: Secondary | ICD-10-CM | POA: Diagnosis not present

## 2020-11-30 DIAGNOSIS — M79672 Pain in left foot: Secondary | ICD-10-CM | POA: Diagnosis not present

## 2020-11-30 DIAGNOSIS — Z8612 Personal history of poliomyelitis: Secondary | ICD-10-CM | POA: Diagnosis not present

## 2020-11-30 DIAGNOSIS — M6258 Muscle wasting and atrophy, not elsewhere classified, other site: Secondary | ICD-10-CM | POA: Diagnosis not present

## 2020-11-30 DIAGNOSIS — R2681 Unsteadiness on feet: Secondary | ICD-10-CM | POA: Diagnosis not present

## 2020-11-30 DIAGNOSIS — M2041 Other hammer toe(s) (acquired), right foot: Secondary | ICD-10-CM | POA: Diagnosis not present

## 2020-12-01 ENCOUNTER — Ambulatory Visit (INDEPENDENT_AMBULATORY_CARE_PROVIDER_SITE_OTHER): Payer: Medicare HMO | Admitting: Obstetrics and Gynecology

## 2020-12-01 ENCOUNTER — Encounter: Payer: Self-pay | Admitting: Obstetrics and Gynecology

## 2020-12-01 ENCOUNTER — Other Ambulatory Visit: Payer: Self-pay

## 2020-12-01 VITALS — BP 127/72 | Ht 61.5 in | Wt 157.0 lb

## 2020-12-01 DIAGNOSIS — N9489 Other specified conditions associated with female genital organs and menstrual cycle: Secondary | ICD-10-CM | POA: Diagnosis not present

## 2020-12-01 NOTE — Progress Notes (Signed)
Obstetrics & Gynecology Office Visit   Chief Complaint:  Chief Complaint  Patient presents with   Pelvic Pain    Referral for pelvic congestion - Procedure RM    History of Present Illness: 74 y.o. presenting for evaluation of pelvic pain and imaging showing possible pelvic congestion syndrome.  The patient has no history of prior complicated vaginal deliveries, or lower extremity varicosities.  She has a complicated history of prior colon cancer status post chemotherapy and right hemicolectomy.  She reports left lower quadrant abdominal pain that is chronic.  She does feel symptoms are worse when standing or on her feet.  No AUB. She has had two imaging CT imaging studies  06/22/2016 and 10/19/2020 suggestive of pelvic congestion syndrome.  There laterality of the patient symptoms does correspond with laterality of imaging findings.     Review of Systems: Review of Systems  Constitutional: Negative.   Gastrointestinal:  Positive for abdominal pain and diarrhea. Negative for blood in stool, constipation, melena, nausea and vomiting.  Genitourinary: Negative.     Past Medical History:  Past Medical History:  Diagnosis Date   Anxiety    Colon cancer ([place]) 10/12/2014   Stage IIB; had chemo   Diabetes mellitus without complication (HCC)    Hyperlipidemia    Hypertension    Lateral epicondylitis    Personal history of chemotherapy    Polio    childhood   Skin cancer    Sqamous Cell, Lt Calf   Sleep apnea    no CPAP, sx improved with wt loss    Past Surgical History:  Past Surgical History:  Procedure Laterality Date   APPENDECTOMY     BACK SURGERY     CATARACT EXTRACTION W/PHACO Left 12/03/2017   Procedure: CATARACT EXTRACTION PHACO AND INTRAOCULAR LENS PLACEMENT ([place]) LEFT  DIABETIC;  Surgeon: Eulogio Bear, MD;  Location: [place];  Service: Ophthalmology;  Laterality: Left;  Diabetic - insulin   CATARACT EXTRACTION W/PHACO Right 01/14/2018   Procedure:  CATARACT EXTRACTION PHACO AND INTRAOCULAR LENS PLACEMENT ([facility]) RIGHT DIABETIC;  Surgeon: Eulogio Bear, MD;  Location: [place];  Service: Ophthalmology;  Laterality: Right;  Diabetic - insulin   CERVICAL DISC SURGERY     CHOLECYSTECTOMY     COLON SURGERY     COLONOSCOPY     COLONOSCOPY WITH PROPOFOL N/A 05/17/2015   Procedure: COLONOSCOPY WITH PROPOFOL;  Surgeon: Manya Silvas, MD;  [facility] ENDOSCOPY;  Service: Endoscopy;  Laterality: N/A;   COLONOSCOPY WITH PROPOFOL N/A 04/01/2018   Procedure: COLONOSCOPY WITH BIOPSY;  Surgeon: [name], MD;  Location: [place];  Service: Endoscopy;  Laterality: N/A;  Diabetic - insulin   ESOPHAGOGASTRODUODENOSCOPY     HERNIA REPAIR     POLYPECTOMY N/A 04/01/2018   Procedure: POLYPECTOMY;  Surgeon: [name], MD;  Location: [place];  Service: Endoscopy;  Laterality: N/A;   PORT A CATH INJECTION ([place] HX)     Port has been removed   TUBAL LIGATION      Gynecologic History: No LMP recorded. Patient is postmenopausal.  Obstetric History: [id number]  Family History:  Family History  Problem Relation Age of Onset   Alzheimer's disease Mother    Heart disease Father    Colon cancer Cousin    Colon cancer Cousin    Cancer Cousin        Brain Tumor   Breast cancer Neg Hx     Social History:  Social  History   Socioeconomic History   Marital status: Married    Spouse name: Not on file   Number of children: Not on file   Years of education: Not on file   Highest education level: Not on file  Occupational History   Not on file  Tobacco Use   Smoking status: Former    Packs/day: 1.50    Years: 20.00    Pack years: 30.00    Types: Cigarettes    Quit date: 04/17/1981    Years since quitting: 39.6   Smokeless tobacco: Never  Vaping Use   Vaping Use: Never used  Substance and Sexual Activity   Alcohol use: No   Drug use: No   Sexual activity: Yes  Other Topics Concern   Not on file   Social History Narrative   Not on file   Social Determinants of Health   Financial Resource Strain: Not on file  Food Insecurity: Not on file  Transportation Needs: Not on file  Physical Activity: Not on file  Stress: Not on file  Social Connections: Not on file  Intimate Partner Violence: Not on file    Allergies:  Allergies  Allergen Reactions   Lisinopril Cough    Medications: Prior to Admission medications   Medication Sig Start Date End Date Taking? Authorizing Provider  albuterol (VENTOLIN HFA) 108 (90 Base) MCG/ACT inhaler Inhale 2 puffs into the lungs every 6 (six) hours as needed for wheezing or shortness of breath. 07/08/20   McDonough, Lauren K, PA-C  amLODipine (NORVASC) 5 MG tablet TAKE 2 TABLETS DAILY. 09/24/20   Lavera Guise, MD  Blood Glucose Monitoring Suppl (TRUE METRIX METER) w/Device KIT USE AS DIRECTED 09/24/20   Lavera Guise, MD  cetirizine (ZYRTEC) 10 MG chewable tablet Chew 1 tablet (10 mg total) by mouth daily. 09/12/19   Ronnell [place], NP  chlorpheniramine-HYDROcodone (TUSSIONEX PENNKINETIC ER) 10-8 MG/5ML SUER Take 5 mLs by mouth at bedtime as needed for cough. 09/01/20   Lavera Guise, MD  citalopram (CELEXA) 20 MG tablet TAKE 2 TABLETS DAILY FOR DEPRESSION. 04/26/20   Ronnell [place], NP  Dapagliflozin Propanediol (FARXIGA PO) Take by mouth.    [provider]  fenofibrate 54 MG tablet Take 1 tablet (54 mg total) by mouth daily. 03/25/20   Ronnell Freshwater, NP  fluticasone (FLONASE) 50 MCG/ACT nasal spray Place 2 sprays into both nostrils daily. 12/16/19   Ronnell Freshwater, NP  ibuprofen (ADVIL) 800 MG tablet Take 1 tablet (800 mg total) by mouth every 8 (eight) hours as needed. 11/16/20   Jonetta Osgood, NP  insulin aspart protamine - aspart (NOVOLOG MIX 70/30 FLEXPEN) (70-30) 100 UNIT/ML FlexPen Inject 0.35 mLs (35 Units total) into the skin 2 (two) times daily. Inject 35 units [place] QAM. Gradually increase to 40units [place] QPM 10/02/19   Ronnell [place], NP  lovastatin (MEVACOR) 20 MG tablet TAKE 1 TABLET AT BEDTIME FOR HIGH CHOLESTEROL 10/13/20   Lavera Guise, MD  Multiple Vitamin (MULTIVITAMIN) tablet Take by mouth daily. TAKES 1/2 TABLET    [provider]  pantoprazole (PROTONIX) 20 MG tablet Take 2 tablets (40 mg total) by mouth daily. 04/27/20   Ronnell [place]water, NP  sharps container 1 each by Does not apply route as needed. To use for insulin syringes and needles. Taking insulin at least twice daily.  E11.65 11/06/17   Ronnell Freshwater, NP  spironolactone (ALDACTONE) 100 MG tablet Take 100 mg by mouth  daily. 2 tablets once a day    [provider]  TRUE METRIX BLOOD GLUCOSE TEST test strip TEST BLOOD SUGAR THREE TIMES DAILY AFTER MEALS 09/24/20   Lavera Guise, MD  TRUEplus Lancets 33G MISC USE AS DIRECTED TO TEST BLOOD SUGAR THREE TIMES DAILY 10/25/20   Lavera Guise, MD    Physical Exam Vitals:  Vitals:   12/01/20 1039  BP: 127/72   No LMP recorded. Patient is postmenopausal.  General: NAD HEENT: normocephalic, anicteric Pulmonary: No increased work of breathing Genitourinary:  External: Normal external female genitalia.  Normal urethral meatus, normal.  No evidence of external labial varicosities. Bartholin's and Skene's glands.    Vagina: Normal vaginal mucosa, no evidence of prolapse.    Cervix: Grossly normal in appearance, no bleeding  Uterus: Non-enlarged, mobile, normal contour.  No CMT  Adnexa: ovaries non-enlarged, no adnexal masses  Rectal: deferred  Lymphatic: no evidence of inguinal lymphadenopathy Extremities: no edema, erythema, or tenderness Neurologic: Grossly intact Psychiatric: mood appropriate, affect full  Female chaperone present for pelvic  portions of the physical exam  Assessment: 75 y.o. [id number] doing well  Plan: Problem List Items Addressed This Visit       Cardiovascular and Mediastinum   Female pelvic congestion syndrome - Primary   Relevant Orders    Ambulatory referral to Vascular Surgery   1) Questionable pelvic congestion syndrome.  No evidence of labial varicosities or ovarian veins.  Only imaging finding is prominent left uterine vasculature on CT.  Differential could also include some sort of vascular malformation or variation of normal.  This has been present for sometime and was noted on scan 06/22/2016 and at that time did involve the left gonadal veins.   - follow up with vasulcar surgery for futher evaluation and treatment if indicated  2) A total of 15 minutes were spent in face-to-face contact with the patient during this encounter with over half of that time devoted to counseling and coordination of care.  3) Return if symptoms worsen or fail to improve.   Malachy Mood, MD, [facility], [facility] 12/01/2020, 11:05 AM

## 2020-12-06 ENCOUNTER — Telehealth: Payer: Self-pay

## 2020-12-06 NOTE — Telephone Encounter (Signed)
Left vm to confirm 12/08/20 ultrasound-Tiffany Hawkins

## 2020-12-08 ENCOUNTER — Ambulatory Visit: Payer: Medicare HMO

## 2020-12-08 ENCOUNTER — Other Ambulatory Visit: Payer: Self-pay

## 2020-12-08 DIAGNOSIS — R0989 Other specified symptoms and signs involving the circulatory and respiratory systems: Secondary | ICD-10-CM | POA: Diagnosis not present

## 2020-12-10 ENCOUNTER — Other Ambulatory Visit: Payer: Self-pay | Admitting: Nurse Practitioner

## 2020-12-14 ENCOUNTER — Other Ambulatory Visit: Payer: Self-pay

## 2020-12-14 ENCOUNTER — Encounter (INDEPENDENT_AMBULATORY_CARE_PROVIDER_SITE_OTHER): Payer: Medicare HMO | Admitting: Vascular Surgery

## 2020-12-15 NOTE — Procedures (Signed)
[place], [place] [address]  DATE OF SERVICE: 12/08/2020  CAROTID DOPPLER INTERPRETATION:  Bilateral Carotid Ultrsasound and Color Doppler Examination was performed. The RIGHT CCA shows no plaque in the vessel. The LEFT CCA shows no plaque in the vessel. There was no intimal thickening noted in the RIGHT carotid artery. There was no intimal thickening in the LEFT carotid artery.  The RIGHT CCA shows peak systolic velocity of [id number] per second. The end diastolic velocity is [id number] per second on the RIGHT side. The RIGHT ICA shows peak systolic velocity of [id number] per second. RIGHT sided ICA end diastolic velocity is 123456 per second. The RIGHT ECA shows a peak systolic velocity of [id number] per second. The ICA/CCA ratio is calculated to be 1.25. This suggests <50% stenosis. The Vertebral Artery shows antegrade flow.  The LEFT CCA shows peak systolic velocity of [id number] per second. The end diastolic velocity is [id number] per second on the LEFT side. The LEFT ICA shows peak systolic velocity of [id number] second. LEFT sided ICA end diastolic velocity is [id number] per second. The LEFT ECA shows a peak systolic velocity of [id number] per second. The ICA/CCA ratio is calculated to be 0.74. This suggests <50% stenosis. The Vertebral Artery shows antegrade flow.   Impression:    The RIGHT CAROTID shows less than 50% stenosis. The LEFT CAROTID shows <50% stenosis.  There is no plaque formation noted on the LEFT and no plaque on the RIGHT  side. Consider a repeat Carotid doppler if clinical situation and symptoms warrant in 6-12 months. Patient should be encouraged to change lifestyles such as smoking cessation, regular exercise and dietary modification. Use of statins in the right clinical setting and ASA is encouraged.  Allyne Gee, MD [facility] Pulmonary Critical Care Medicine

## 2020-12-19 DIAGNOSIS — G4733 Obstructive sleep apnea (adult) (pediatric): Secondary | ICD-10-CM | POA: Diagnosis not present

## 2020-12-27 ENCOUNTER — Other Ambulatory Visit: Payer: Self-pay | Admitting: Internal Medicine

## 2020-12-28 ENCOUNTER — Encounter (INDEPENDENT_AMBULATORY_CARE_PROVIDER_SITE_OTHER): Payer: Self-pay | Admitting: Vascular Surgery

## 2020-12-28 ENCOUNTER — Other Ambulatory Visit: Payer: Self-pay

## 2020-12-28 ENCOUNTER — Ambulatory Visit (INDEPENDENT_AMBULATORY_CARE_PROVIDER_SITE_OTHER): Payer: Medicare HMO | Admitting: Vascular Surgery

## 2020-12-28 VITALS — BP 136/80 | HR 93 | Resp 16 | Ht 62.0 in | Wt 161.6 lb

## 2020-12-28 DIAGNOSIS — I10 Essential (primary) hypertension: Secondary | ICD-10-CM | POA: Diagnosis not present

## 2020-12-28 DIAGNOSIS — I700 Atherosclerosis of aorta: Secondary | ICD-10-CM | POA: Diagnosis not present

## 2020-12-28 DIAGNOSIS — Z794 Long term (current) use of insulin: Secondary | ICD-10-CM

## 2020-12-28 DIAGNOSIS — E119 Type 2 diabetes mellitus without complications: Secondary | ICD-10-CM

## 2020-12-28 DIAGNOSIS — E782 Mixed hyperlipidemia: Secondary | ICD-10-CM

## 2020-12-28 DIAGNOSIS — N9489 Other specified conditions associated with female genital organs and menstrual cycle: Secondary | ICD-10-CM

## 2020-12-28 NOTE — Progress Notes (Signed)
Patient ID: Tiffany Hawkins, female   DOB: 1945/11/25, 75 y.o.   MRN: [id number]  Chief Complaint  Patient presents with   New Patient (Initial Visit)    Ref  Georgianne Fick le pelvic congestion syndrome    HPI Tiffany Hawkins is a 75 y.o. female.  I am asked to see the patient by Dr. Georgianne Fick for evaluation of pelvic congestion syndrome.  The patient does describe significant abdominal fullness and discomfort.  This is most noticeable on her left side in the pelvis and lower abdominal area.  This is intermittent but most days.  This is something she has dealt with for many many years.  She has had some previous work-up and not had a clear cause of her symptoms.  She denies any nausea or vomiting.  No unintentional weight loss or food fear.  She also describes leg pain with activity.  She says she can only walk relatively short distances before her legs start hurting and aching.  They tire easily and give out on her.  She does not have ulceration or infection.  The pain does not wake her at night.  She has undergone a CT and of the abdomen and pelvis which I have independently reviewed.  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  Vascular findings are also present on the study and does have evidence of aortoiliac calcification on her CT scan.  This was not a CT angiogram is difficult to discern the degree of stenosis.  This also does not evaluate her infrainguinal circulation.   Past Medical History:  Diagnosis Date   Anxiety    Colon cancer ([place]) 10/12/2014   Stage IIB; had chemo   Diabetes mellitus without complication (HCC)    Hyperlipidemia    Hypertension    Lateral epicondylitis    Personal history of chemotherapy    Polio    childhood   Skin cancer    Sqamous Cell, Lt Calf   Sleep apnea    no CPAP, sx improved with wt loss    Past Surgical  History:  Procedure Laterality Date   APPENDECTOMY     BACK SURGERY     CATARACT EXTRACTION W/PHACO Left 12/03/2017   Procedure: CATARACT EXTRACTION PHACO AND INTRAOCULAR LENS PLACEMENT ([place]) LEFT  DIABETIC;  Surgeon: Eulogio Bear, MD;  Location: [facility];  Service: Ophthalmology;  Laterality: Left;  Diabetic - insulin   CATARACT EXTRACTION W/PHACO Right 01/14/2018   Procedure: CATARACT EXTRACTION PHACO AND INTRAOCULAR LENS PLACEMENT ([facility]) RIGHT DIABETIC;  Surgeon: Eulogio Bear, MD;  Location: [facility];  Service: Ophthalmology;  Laterality: Right;  Diabetic - insulin   CERVICAL DISC SURGERY     CHOLECYSTECTOMY     COLON SURGERY     COLONOSCOPY     COLONOSCOPY WITH PROPOFOL N/A 05/17/2015   Procedure: COLONOSCOPY WITH PROPOFOL;  Surgeon: Manya Silvas, MD;  Location: [facility] ENDOSCOPY;  Service: Endoscopy;  Laterality: N/A;   COLONOSCOPY WITH PROPOFOL N/A 04/01/2018   Procedure: COLONOSCOPY WITH BIOPSY;  Surgeon: [name], MD;  Location: [facility];  Service: Endoscopy;  Laterality: N/A;  Diabetic - insulin   ESOPHAGOGASTRODUODENOSCOPY     HERNIA REPAIR     POLYPECTOMY N/A 04/01/2018   Procedure: POLYPECTOMY;  Surgeon: [name], MD;  Location: [facility];  Service: Endoscopy;  Laterality: N/A;   PORT  A CATH INJECTION ([facility])     Port has been removed   TUBAL LIGATION       Family History  Problem Relation Age of Onset   Alzheimer's disease Mother    Heart disease Father    Colon cancer Cousin    Colon cancer Cousin    Cancer Cousin        Brain Tumor   Breast cancer Neg Hx       Social History   Tobacco Use   Smoking status: Former    Packs/day: 1.50    Years: 20.00    Pack years: 30.00    Types: Cigarettes    Quit date: 04/17/1981    Years since quitting: 39.7   Smokeless tobacco: Never  Vaping Use   Vaping Use: Never used  Substance Use Topics   Alcohol use: No   Drug use: No     Allergies  Allergen  Reactions   Lisinopril Cough    Current Outpatient Medications  Medication Sig Dispense Refill   albuterol (VENTOLIN HFA) 108 (90 Base) MCG/ACT inhaler Inhale 2 puffs into the lungs every 6 (six) hours as needed for wheezing or shortness of breath. 8 g 0   amLODipine (NORVASC) 5 MG tablet TAKE 2 TABLETS DAILY. 180 tablet 1   Blood Glucose Monitoring Suppl (TRUE METRIX METER) w/Device KIT USE AS DIRECTED 1 kit 0   cetirizine (ZYRTEC) 10 MG chewable tablet Chew 1 tablet (10 mg total) by mouth daily. 90 tablet 1   chlorpheniramine-HYDROcodone (TUSSIONEX PENNKINETIC ER) 10-8 MG/5ML SUER Take 5 mLs by mouth at bedtime as needed for cough. 140 mL 0   citalopram (CELEXA) 20 MG tablet TAKE 2 TABLETS DAILY FOR DEPRESSION. 180 tablet 1   Dapagliflozin Propanediol (FARXIGA PO) Take by mouth.     fenofibrate 54 MG tablet Take 1 tablet (54 mg total) by mouth daily. 90 tablet 1   fluticasone (FLONASE) 50 MCG/ACT nasal spray Place 2 sprays into both nostrils daily. 48 g 1   ibuprofen (ADVIL) 800 MG tablet Take 1 tablet (800 mg total) by mouth every 8 (eight) hours as needed. 90 tablet 1   insulin aspart protamine - aspart (NOVOLOG MIX 70/30 FLEXPEN) (70-30) 100 UNIT/ML FlexPen Inject 0.35 mLs (35 Units total) into the skin 2 (two) times daily. Inject 35 units [place] QAM. Gradually increase to 40units [place] QPM 15 mL 11   lovastatin (MEVACOR) 20 MG tablet TAKE 1 TABLET AT BEDTIME FOR HIGH CHOLESTEROL 90 tablet 1   Multiple Vitamin (MULTIVITAMIN) tablet Take by mouth daily. TAKES 1/2 TABLET     pantoprazole (PROTONIX) 20 MG tablet Take 2 tablets (40 mg total) by mouth daily. 90 tablet 1   sharps container 1 each by Does not apply route as needed. To use for insulin syringes and needles. Taking insulin at least twice daily.  E11.65 1 each 5   spironolactone (ALDACTONE) 100 MG tablet Take 100 mg by mouth daily. 2 tablets once a day     TRUE METRIX BLOOD GLUCOSE TEST test strip TEST BLOOD SUGAR THREE TIMES DAILY AFTER  MEALS 200 strip 3   TRUEplus Lancets 33G MISC USE AS DIRECTED TO TEST BLOOD SUGAR THREE TIMES DAILY 300 each 3   No current facility-administered medications for this visit.      REVIEW OF SYSTEMS (Negative unless checked)  Constitutional: [] Weight loss  [] Fever  [] Chills Cardiac: [] Chest pain   [] Chest pressure   [] Palpitations   [] Shortness of breath when laying flat   []   Shortness of breath at rest   [] Shortness of breath with exertion. Vascular:  [x] Pain in legs with walking   [] Pain in legs at rest   [] Pain in legs when laying flat   [x] Claudication   [] Pain in feet when walking  [] Pain in feet at rest  [] Pain in feet when laying flat   [] History of DVT   [] Phlebitis   [x] Swelling in legs   [x] Varicose veins   [] Non-healing ulcers Pulmonary:   [] Uses home oxygen   [] Productive cough   [] Hemoptysis   [] Wheeze  [] COPD   [] Asthma Neurologic:  [] Dizziness  [] Blackouts   [] Seizures   [] History of stroke   [] History of TIA  [] Aphasia   [] Temporary blindness   [] Dysphagia   [] Weakness or numbness in arms   [] Weakness or numbness in legs Musculoskeletal:  [x] Arthritis   [] Joint swelling   [] Joint pain   [] Low back pain Hematologic:  [] Easy bruising  [] Easy bleeding   [] Hypercoagulable state   [] Anemic  [] Hepatitis Gastrointestinal:  [] Blood in stool   [] Vomiting blood  [] Gastroesophageal reflux/heartburn   [] Abdominal pain Genitourinary:  [] Chronic kidney disease   [] Difficult urination  [] Frequent urination  [] Burning with urination   [] Hematuria Skin:  [] Rashes   [] Ulcers   [] Wounds Psychological:  [] History of anxiety   []  History of major depression.    Physical Exam BP 136/80 (BP Location: Right Arm)   Pulse 93   Resp 16   Ht 5' 2"  (1.575 m)   Wt 161 lb 9.6 oz (73.3 kg)   BMI 29.56 kg/m  Gen:  WD/WN, NAD Head: [place]/AT, No temporalis wasting.  Ear/Nose/Throat: Hearing grossly intact, nares w/o erythema or drainage, oropharynx w/o Erythema/Exudate Eyes: Conjunctiva clear, sclera  non-icteric  Neck: trachea midline.  No JVD.  Pulmonary:  Good air movement, respirations not labored, no use of accessory muscles  Cardiac: RRR, no JVD Vascular:  Vessel Right Left  Radial Palpable Palpable                          DP 1+ 2+  PT 1+ 1+   Gastrointestinal:. No masses, surgical incisions, or scars. Musculoskeletal: M/S 5/5 throughout.  Extremities without ischemic changes.  No deformity or atrophy. Trace LE edema. Neurologic: Sensation grossly intact in extremities.  Symmetrical.  Speech is fluent. Motor exam as listed above. Psychiatric: Judgment intact, Mood & affect appropriate for pt's clinical situation. Dermatologic: No rashes or ulcers noted.  No cellulitis or open wounds.    Radiology US Carotid Duplex Bilateral  Result Date: 12/08/2020 Allyne Gee, MD     12/15/2020  7:57 PM [place], [place] [address] DATE OF SERVICE: 12/08/2020 CAROTID DOPPLER INTERPRETATION: Bilateral Carotid Ultrsasound and Color Doppler Examination was performed. The RIGHT CCA shows no plaque in the vessel. The LEFT CCA shows no plaque in the vessel. There was no intimal thickening noted in the RIGHT carotid artery. There was no intimal thickening in the LEFT carotid artery. The RIGHT CCA shows peak systolic velocity of 19JY per second. The end diastolic velocity is 78GN per second on the RIGHT side. The RIGHT ICA shows peak systolic velocity of 562 per second. RIGHT sided ICA end diastolic velocity is 13YQ per second. The RIGHT ECA shows a peak systolic velocity of 65HQ per second. The ICA/CCA ratio is calculated to be 1.25. This suggests <50% stenosis. The Vertebral Artery shows antegrade flow. The LEFT CCA shows peak systolic velocity of  91cm per second. The end diastolic velocity is 40HK per second on the LEFT side. The LEFT ICA shows peak systolic velocity of 74QVZ second. LEFT sided ICA end diastolic velocity is 56LO per second. The LEFT ECA shows a  peak systolic velocity of 75IE per second. The ICA/CCA ratio is calculated to be 0.74. This suggests <50% stenosis. The Vertebral Artery shows antegrade flow. Impression:  The RIGHT CAROTID shows less than 50% stenosis. The LEFT CAROTID shows <50% stenosis.  There is no plaque formation noted on the LEFT and no plaque on the RIGHT  side. Consider a repeat Carotid doppler if clinical situation and symptoms warrant in 6-12 months. Patient should be encouraged to change lifestyles such as smoking cessation, regular exercise and dietary modification. Use of statins in the right clinical setting and ASA is encouraged. Allyne Gee, MD [facility] Pulmonary Critical Care Medicine    Labs Recent Results (from the past 2160 hour(s))  Comprehensive metabolic panel     Status: Abnormal   Collection Time: 10/07/20  1:51 PM  Result Value Ref Range   Sodium 140 135 - 145 mmol/L   Potassium 4.1 3.5 - 5.1 mmol/L   Chloride 107 98 - 111 mmol/L   CO2 27 22 - 32 mmol/L   Glucose, Bld 141 (H) 70 - 99 mg/dL    Comment: Glucose reference range applies only to samples taken after fasting for at least 8 hours.   BUN 23 8 - 23 mg/dL   Creatinine, Ser 0.96 0.44 - 1.00 mg/dL   Calcium 9.5 8.9 - 10.3 mg/dL   Total Protein 7.6 6.5 - 8.1 g/dL   Albumin 4.3 3.5 - 5.0 g/dL   AST 21 15 - 41 U/L   ALT 27 0 - 44 U/L   Alkaline Phosphatase 41 38 - 126 U/L   Total Bilirubin 0.3 0.3 - 1.2 mg/dL   GFR, Estimated >60 >60 mL/min    Comment: (NOTE) Calculated using the CKD-EPI Creatinine Equation (2021)    Anion gap 6 5 - 15    Comment: Performed at [facility], [address] [place], [place] [address]  CBC with Differential/Platelet     Status: Abnormal   Collection Time: 10/07/20  1:51 PM  Result Value Ref Range   WBC 7.3 4.0 - 10.5 K/uL   RBC 5.32 (H) 3.87 - 5.11 MIL/uL   Hemoglobin 14.5 12.0 - 15.0 g/dL   HCT 44.9 36.0 - 46.0 %   MCV 84.4 80.0 - 100.0 fL   MCH 27.3 26.0 - 34.0 pg   MCHC 32.3 30.0 - 36.0  g/dL   RDW 14.0 11.5 - 15.5 %   Platelets 195 150 - 400 K/uL   nRBC 0.0 0.0 - 0.2 %   Neutrophils Relative % 56 %   Neutro Abs 4.0 1.7 - 7.7 K/uL   Lymphocytes Relative 27 %   Lymphs Abs 2.0 0.7 - 4.0 K/uL   Monocytes Relative 13 %   Monocytes Absolute 0.9 0.1 - 1.0 K/uL   Eosinophils Relative 4 %   Eosinophils Absolute 0.3 0.0 - 0.5 K/uL   Basophils Relative 0 %   Basophils Absolute 0.0 0.0 - 0.1 K/uL   Immature Granulocytes 0 %   Abs Immature Granulocytes 0.02 0.00 - 0.07 K/uL    Comment: Performed at [facility], [address] [place], [place] [address]  CEA     Status: None   Collection Time: 10/07/20  1:51 PM  Result Value  Ref Range   CEA 2.5 0.0 - 4.7 ng/mL    Comment: (NOTE)                             Nonsmokers          <3.9                             Smokers             <5.6 [employer] Electrochemiluminescence Immunoassay (ECLIA) Values obtained with different assay methods or kits cannot be used interchangeably.  Results cannot be interpreted as absolute evidence of the presence or absence of malignant disease. Performed At: [facility] [place], [place] [id number] Rush Farmer MD YC:[id number]   Gram Stain w/Sputum Cult Rflx     Status: None   Collection Time: 10/19/20 11:22 AM   [place]  Result Value Ref Range   White Blood Cells Few    Epithelial Cells Few    Result 1 Comment     Comment: Few gram positive cocci   Gram Stain Evaluation Comment     Comment: This specimen is of good quality and is acceptable for routine bacterial culture.   Sputum Culture     Status: None   Collection Time: 10/19/20 11:22 AM   [place]  Result Value Ref Range   Lower Respiratory Culture Final report    Result 1 Routine flora     Comment: Scant growth    Assessment/Plan:  Female pelvic congestion syndrome I have independently reviewed her CT scan performed in July.  This shows a very large left gonadal vein and very large  pelvic varicosities with signs consistent with pelvic congestion syndrome.  She does have symptoms that are consistent with pelvic congestion syndrome and has had them for many years to decades.  She denies any previous knowledge or history of this diagnosis.  We discussed the pathophysiology and natural history of pelvic congestion syndrome.  She is interested in having this treated and so we will set her up for venogram and pelvic embolization in the near future at her convenience  Essential hypertension blood pressure control important in reducing the progression of atherosclerotic disease. On appropriate oral medications.   Type 2 diabetes mellitus without complication, with long-term current use of insulin (HCC) blood glucose control important in reducing the progression of atherosclerotic disease. Also, involved in wound healing. On appropriate medications.   Mixed hyperlipidemia lipid control important in reducing the progression of atherosclerotic disease. Continue statin therapy   Aortic atherosclerosis (HCC) The patient describes leg pain and weakness with activity that could be consistent with arterial insufficiency and claudication, and does have evidence of aortoiliac calcification on her CT scan.  This was not a CT angiogram is difficult to discern the degree of stenosis.  This also does not evaluate her infrainguinal circulation.  We will have her return for noninvasive studies in the office in the near future at her convenience.  She does not have any immediate limb threatening symptoms but does have what sounds like significant claudication symptoms.  We discussed the pathophysiology and natural history of peripheral arterial disease today.      Leotis Pain 12/29/2020, 11:11 AM   This note was created with Dragon medical transcription system.  Any errors from dictation are unintentional.

## 2020-12-29 ENCOUNTER — Other Ambulatory Visit: Payer: Self-pay | Admitting: Orthopedic Surgery

## 2020-12-29 ENCOUNTER — Other Ambulatory Visit (HOSPITAL_COMMUNITY): Payer: Self-pay | Admitting: Orthopedic Surgery

## 2020-12-29 DIAGNOSIS — M546 Pain in thoracic spine: Secondary | ICD-10-CM

## 2020-12-29 DIAGNOSIS — R079 Chest pain, unspecified: Secondary | ICD-10-CM | POA: Diagnosis not present

## 2020-12-29 DIAGNOSIS — M48061 Spinal stenosis, lumbar region without neurogenic claudication: Secondary | ICD-10-CM

## 2020-12-29 DIAGNOSIS — M545 Low back pain, unspecified: Secondary | ICD-10-CM | POA: Diagnosis not present

## 2020-12-29 DIAGNOSIS — M5442 Lumbago with sciatica, left side: Secondary | ICD-10-CM | POA: Diagnosis not present

## 2020-12-29 DIAGNOSIS — M4807 Spinal stenosis, lumbosacral region: Secondary | ICD-10-CM | POA: Diagnosis not present

## 2020-12-29 DIAGNOSIS — G8929 Other chronic pain: Secondary | ICD-10-CM | POA: Diagnosis not present

## 2020-12-29 DIAGNOSIS — I700 Atherosclerosis of aorta: Secondary | ICD-10-CM | POA: Insufficient documentation

## 2020-12-29 NOTE — Assessment & Plan Note (Signed)
I have independently reviewed her CT scan performed in July.  This shows a very large left gonadal vein and very large pelvic varicosities with signs consistent with pelvic congestion syndrome.  Tiffany Hawkins does have symptoms that are consistent with pelvic congestion syndrome and has had them for many years to decades.  Tiffany Hawkins denies any previous knowledge or history of this diagnosis.  We discussed the pathophysiology and natural history of pelvic congestion syndrome.  Tiffany Hawkins is interested in having this treated and so we will set her up for venogram and pelvic embolization in the near future at her convenience

## 2020-12-29 NOTE — Assessment & Plan Note (Signed)
The patient describes leg pain and weakness with activity that could be consistent with arterial insufficiency and claudication, and does have evidence of aortoiliac calcification on her CT scan.  This was not a CT angiogram is difficult to discern the degree of stenosis.  This also does not evaluate her infrainguinal circulation.  We will have her return for noninvasive studies in the office in the near future at her convenience.  She does not have any immediate limb threatening symptoms but does have what sounds like significant claudication symptoms.  We discussed the pathophysiology and natural history of peripheral arterial disease today.

## 2020-12-29 NOTE — Assessment & Plan Note (Signed)
blood glucose control important in reducing the progression of atherosclerotic disease. Also, involved in wound healing. On appropriate medications.  

## 2020-12-29 NOTE — Assessment & Plan Note (Signed)
blood pressure control important in reducing the progression of atherosclerotic disease. On appropriate oral medications.  

## 2020-12-29 NOTE — Patient Instructions (Signed)
Peripheral Vascular Disease ?Peripheral vascular disease (PVD) is a disease of the blood vessels that carry blood from the heart to the rest of the body. PVD is also called peripheral artery disease (PAD) or poor circulation. PVD affects most of the body. But it affects the legs and feet the most. ?PVD can lead to acute limb ischemia. This happens when there is a sudden stop of blood flow to an arm or leg. This is a medical emergency. ?What are the causes? ?The most common cause of PVD is a buildup of a fatty substance (plaque) inside your arteries. This decreases blood flow. Plaque can break off and block blood in a smaller artery. This can lead to acute limb ischemia. ?Other common causes of PVD include: ?Blood clots inside the blood vessels. ?Injuries to blood vessels. ?Irritation and swelling of blood vessels. ?Sudden tightening of the blood vessel (spasms). ?What increases the risk? ?A family history of PVD. ?Medical conditions, including: ?High cholesterol. ?Diabetes. ?High blood pressure. ?Heart disease. ?Past problems with blood clots. ?Past injury, such as burns or a broken bone. ?Other conditions, such as: ?Buerger's disease. This is caused by swollen or irritated blood vessels in your hands and feet. ?Arthritis. ?Birth defects that affect the arteries in your legs. ?Kidney disease. ?Using tobacco or nicotine products. ?Not getting enough exercise. ?Being very overweight (obese). ?Being 50 years old or older. ?What are the signs or symptoms? ?Cramps in your butt, legs, and feet. ?Pain and weakness in your legs when you are active that goes away when you rest. ?Leg pain when at rest. ?Leg numbness, tingling, or weakness. ?Coldness in a leg or foot, especially when compared with the other leg or foot. ?Skin or hair changes. These can include: ?Hair loss. ?Shiny skin. ?Pale or bluish skin. ?Thick toenails. ?Being unable to get or keep an erection. ?Tiredness (fatigue). ?Weak pulse or no pulse in the  feet. ?Wounds and sores on the toes, feet, or legs. These take longer to heal. ?How is this treated? ?Underlying causes are treated first. Other conditions, like diabetes, high cholesterol, and blood pressure, are also treated. Treatment may include: ?Lifestyle changes, such as: ?Quitting smoking. ?Getting regular exercise. ?Having a diet low in fat and cholesterol. ?Not drinking alcohol. ?Taking medicines, such as: ?Blood thinners. ?Medicines to improve blood flow. ?Medicines to improve your blood cholesterol. ?Procedures to: ?Open the arteries and restore blood flow. ?Insert a small mesh tube (stent) to keep a blocked vessel open. ?Create a new path for blood to flow to the body (peripheral bypass). ?Remove dead tissue from a wound. ?Remove an affected leg or arm. ?Follow these instructions at home: ?Medicines ?Take over-the-counter and prescription medicines only as told by your doctor. ?If you are taking blood thinners: ?Talk with your doctor before you take any medicines that have aspirin, or NSAIDs, such as ibuprofen. ?Take medicines exactly as told. Take them at the same time each day. ?Avoid doing things that could hurt or bruise you. Take action to prevent falls. ?Wear an alert bracelet or carry a card that shows you are taking blood thinners. ?Lifestyle ?  ?Get regular exercise. Ask your doctor about how to stay active. ?Talk with your doctor about keeping a healthy weight. If needed, ask about losing weight. ?Eat a diet that is low in fat and cholesterol. If you need help, talk with your doctor. ?Do not drink alcohol. ?Do not smoke or use any products that contain nicotine or tobacco. If you need help quitting, ask your   doctor. ?General instructions ?Take good care of your feet. To do this: ?Wear shoes that fit well and feel good. ?Check your feet often for any cuts or sores. ?Get a flu shot (influenza vaccine) each year. ?Keep all follow-up visits. ?Where to find more information ?Society for Vascular  Surgery: vascular.org ?American Heart Association: heart.org ?National Heart, Lung, and Blood Institute: nhlbi.nih.gov ?Contact a doctor if: ?You have cramps in your legs when you walk. ?You have leg pain when you rest. ?Your leg or foot feels cold. ?Your skin changes. ?You cannot get or keep an erection. ?You have cuts or sores on your legs or feet that do not heal. ?Get help right away if: ?You have sudden changes in the color and feeling of your arms or legs, such as: ?Your arm or leg turns cold, numb, and blue. ?Your arm or leg becomes red, warm, swollen, painful, or numb. ?You have any signs of a stroke. "BE FAST" is an easy way to remember the main warning signs: ?B - Balance. Dizziness, sudden trouble walking, or loss of balance. ?E - Eyes. Trouble seeing or a change in how you see. ?F - Face. Sudden weakness or loss of feeling of the face. The face or eyelid may droop on one side. ?A - Arms. Weakness or loss of feeling in an arm. This happens all of a sudden and most often on one side of the body. ?S - Speech. Sudden trouble speaking, slurred speech, or trouble understanding what people say. ?T - Time. Time to call emergency services. Write down what time symptoms started. ?You have other signs of a stroke, such as: ?A sudden, very bad headache with no known cause. ?Feeling like you may vomit (nausea). ?Vomiting. ?A seizure. ?You have chest pain or trouble breathing. ?These symptoms may be an emergency. Get help right away. Call your local emergency services (911 in the [place] ?Do not wait to see if the symptoms will go away. ?Do not drive yourself to the hospital. ?Summary ?Peripheral vascular disease (PVD) is a disease of the blood vessels. ?PVD affects the legs and feet the most. ?Symptoms may include leg pain or leg numbness, tingling, and weakness. ?Treatment may include lifestyle changes, medicines, and procedures. ?This information is not intended to replace advice given to you by your health care  provider. Make sure you discuss any questions you have with your health care provider. ?Document Revised: 10/06/2019 Document Reviewed: 10/06/2019 ?Elsevier Patient Education ? 2022 Elsevier Inc. ? ?

## 2020-12-29 NOTE — Assessment & Plan Note (Signed)
lipid control important in reducing the progression of atherosclerotic disease. Continue statin therapy  

## 2020-12-30 ENCOUNTER — Other Ambulatory Visit: Payer: Self-pay

## 2020-12-30 ENCOUNTER — Ambulatory Visit
Admission: RE | Admit: 2020-12-30 | Discharge: 2020-12-30 | Disposition: A | Discharge status: Home / Self Care | Payer: Medicare HMO | Source: Ambulatory Visit | Attending: Orthopedic Surgery | Admitting: Orthopedic Surgery

## 2020-12-30 DIAGNOSIS — M545 Low back pain, unspecified: Secondary | ICD-10-CM | POA: Insufficient documentation

## 2020-12-30 DIAGNOSIS — M546 Pain in thoracic spine: Secondary | ICD-10-CM

## 2020-12-30 DIAGNOSIS — M5023 Other cervical disc displacement, cervicothoracic region: Secondary | ICD-10-CM | POA: Diagnosis not present

## 2020-12-30 DIAGNOSIS — M5124 Other intervertebral disc displacement, thoracic region: Secondary | ICD-10-CM | POA: Diagnosis not present

## 2020-12-30 DIAGNOSIS — M2578 Osteophyte, vertebrae: Secondary | ICD-10-CM | POA: Diagnosis not present

## 2020-12-30 DIAGNOSIS — M48061 Spinal stenosis, lumbar region without neurogenic claudication: Secondary | ICD-10-CM | POA: Diagnosis not present

## 2020-12-30 DIAGNOSIS — M47814 Spondylosis without myelopathy or radiculopathy, thoracic region: Secondary | ICD-10-CM | POA: Diagnosis not present

## 2020-12-30 DIAGNOSIS — M47816 Spondylosis without myelopathy or radiculopathy, lumbar region: Secondary | ICD-10-CM | POA: Diagnosis not present

## 2020-12-31 ENCOUNTER — Telehealth: Payer: Self-pay | Admitting: Internal Medicine

## 2020-12-31 NOTE — Progress Notes (Signed)
  Chronic Care Management   Outreach Note  12/31/2020 Name: Tiffany Hawkins MRN: [id number] DOB: 1945/12/12  Referred by: Lavera Guise, MD Reason for referral : No chief complaint on file.   An unsuccessful telephone outreach was attempted today. The patient was referred to the pharmacist for assistance with care management and care coordination.   Follow Up Plan:   Tatjana Dellinger Upstream Scheduler

## 2021-01-03 DIAGNOSIS — R29898 Other symptoms and signs involving the musculoskeletal system: Secondary | ICD-10-CM | POA: Diagnosis not present

## 2021-01-03 DIAGNOSIS — S3210XA Unspecified fracture of sacrum, initial encounter for closed fracture: Secondary | ICD-10-CM | POA: Diagnosis not present

## 2021-01-03 DIAGNOSIS — M545 Low back pain, unspecified: Secondary | ICD-10-CM | POA: Diagnosis not present

## 2021-01-05 ENCOUNTER — Ambulatory Visit: Payer: Medicare HMO | Admitting: Internal Medicine

## 2021-01-07 ENCOUNTER — Telehealth: Payer: Self-pay | Admitting: Internal Medicine

## 2021-01-07 NOTE — Chronic Care Management (AMB) (Signed)
  Chronic Care Management   Note  01/07/2021 Name: Tiffany Hawkins MRN: [id number] DOB: [birth year]  Tiffany Hawkins is a 75 y.o. year old female who is a primary care patient of Lavera Guise, MD. I reached out to Shelba Flake by phone today in response to a referral sent by Ms. Neomia Glass Kramp's PCP, Lavera Guise, MD.   Ms. Chick was given information about Chronic Care Management services today including:  CCM service includes personalized support from designated clinical staff supervised by her physician, including individualized plan of care and coordination with other care providers 24/7 contact phone numbers for assistance for urgent and routine care needs. Service will only be billed when office clinical staff spend 20 minutes or more in a month to coordinate care. Only one practitioner may furnish and bill the service in a calendar month. The patient may stop CCM services at any time (effective at the end of the month) by phone call to the office staff.   Patient agreed to services and verbal consent obtained.   Follow up plan:   Tatjana Secretary/administrator

## 2021-01-12 ENCOUNTER — Ambulatory Visit (INDEPENDENT_AMBULATORY_CARE_PROVIDER_SITE_OTHER): Payer: Medicare HMO | Admitting: Nurse Practitioner

## 2021-01-12 ENCOUNTER — Encounter (INDEPENDENT_AMBULATORY_CARE_PROVIDER_SITE_OTHER): Payer: Medicare HMO

## 2021-01-12 ENCOUNTER — Ambulatory Visit (INDEPENDENT_AMBULATORY_CARE_PROVIDER_SITE_OTHER): Payer: Medicare HMO

## 2021-01-12 ENCOUNTER — Other Ambulatory Visit: Payer: Self-pay

## 2021-01-12 ENCOUNTER — Encounter (INDEPENDENT_AMBULATORY_CARE_PROVIDER_SITE_OTHER): Payer: Self-pay | Admitting: Nurse Practitioner

## 2021-01-12 VITALS — BP 143/83 | HR 78 | Resp 16 | Wt 158.0 lb

## 2021-01-12 DIAGNOSIS — I700 Atherosclerosis of aorta: Secondary | ICD-10-CM

## 2021-01-12 DIAGNOSIS — I10 Essential (primary) hypertension: Secondary | ICD-10-CM | POA: Diagnosis not present

## 2021-01-12 DIAGNOSIS — N9489 Other specified conditions associated with female genital organs and menstrual cycle: Secondary | ICD-10-CM

## 2021-01-12 DIAGNOSIS — E119 Type 2 diabetes mellitus without complications: Secondary | ICD-10-CM | POA: Diagnosis not present

## 2021-01-12 DIAGNOSIS — Z794 Long term (current) use of insulin: Secondary | ICD-10-CM

## 2021-01-13 ENCOUNTER — Encounter: Payer: Self-pay | Admitting: Physical Therapy

## 2021-01-13 ENCOUNTER — Ambulatory Visit: Payer: Medicare HMO | Attending: Physical Medicine & Rehabilitation | Admitting: Physical Therapy

## 2021-01-13 DIAGNOSIS — B91 Sequelae of poliomyelitis: Secondary | ICD-10-CM | POA: Diagnosis not present

## 2021-01-13 DIAGNOSIS — M545 Low back pain, unspecified: Secondary | ICD-10-CM | POA: Insufficient documentation

## 2021-01-13 DIAGNOSIS — R29898 Other symptoms and signs involving the musculoskeletal system: Secondary | ICD-10-CM | POA: Insufficient documentation

## 2021-01-13 DIAGNOSIS — S3210XS Unspecified fracture of sacrum, sequela: Secondary | ICD-10-CM | POA: Diagnosis not present

## 2021-01-13 DIAGNOSIS — S322XXS Fracture of coccyx, sequela: Secondary | ICD-10-CM | POA: Insufficient documentation

## 2021-01-13 DIAGNOSIS — Z9181 History of falling: Secondary | ICD-10-CM | POA: Diagnosis not present

## 2021-01-13 DIAGNOSIS — M89662 Osteopathy after poliomyelitis, left lower leg: Secondary | ICD-10-CM | POA: Insufficient documentation

## 2021-01-13 NOTE — Therapy (Signed)
[facility] [address] [place], [place], [address] Phone: [phone]   Fax:  [phone]  Physical Therapy Treatment  Patient Details  Name: Tiffany Hawkins MRN: [id number] Date of Birth: 1945-06-11 Referring Provider (PT): Girtha Hake MD  Encounter Date: 01/13/2021   PT End of Session - 01/13/21 1235     Visit Number 1    Number of Visits 17    Date for PT Re-Evaluation 03/10/21    Authorization - Visit Number 1    Authorization - Number of Visits 10    Progress Note Due on Visit 10    PT Start Time 2633    PT Stop Time 0946    PT Time Calculation (min) 51 min    Equipment Utilized During Treatment Gait belt;Other (comment)   [id number] and FWW   Activity Tolerance Patient tolerated treatment well;Patient limited by fatigue;Patient limited by pain    Behavior During Therapy [facility] for tasks assessed/performed             Past Medical History:  Diagnosis Date   Anxiety    Colon cancer ([place]) 10/12/2014   Stage IIB; had chemo   Diabetes mellitus without complication ([place])    Hyperlipidemia    Hypertension    Lateral epicondylitis    Personal history of chemotherapy    Polio    childhood   Skin cancer    Sqamous Cell, Lt Calf   Sleep apnea    no CPAP, sx improved with wt loss    Past Surgical History:  Procedure Laterality Date   APPENDECTOMY     BACK SURGERY     CATARACT EXTRACTION W/PHACO Left 12/03/2017   Procedure: CATARACT EXTRACTION PHACO AND INTRAOCULAR LENS PLACEMENT ([place]) LEFT  DIABETIC;  Surgeon: Eulogio Bear, MD;  Location: [place];  Service: Ophthalmology;  Laterality: Left;  Diabetic - insulin   CATARACT EXTRACTION W/PHACO Right 01/14/2018   Procedure: CATARACT EXTRACTION PHACO AND INTRAOCULAR LENS PLACEMENT ([facility]) RIGHT DIABETIC;  Surgeon: Eulogio Bear, MD;  Location: [place];  Service: Ophthalmology;  Laterality: Right;  Diabetic - insulin   CERVICAL DISC SURGERY      CHOLECYSTECTOMY     COLON SURGERY     COLONOSCOPY     COLONOSCOPY WITH PROPOFOL N/A 05/17/2015   Procedure: COLONOSCOPY WITH PROPOFOL;  Surgeon: Manya Silvas, MD;  Location[facility];  Service: Endoscopy;  Laterality: N/A;   COLONOSCOPY WITH PROPOFOL N/A 04/01/2018   Procedure: COLONOSCOPY WITH BIOPSY;  Surgeon: [name], MD;  Location: [place];  Service: Endoscopy;  Laterality: N/A;  Diabetic - insulin   ESOPHAGOGASTRODUODENOSCOPY     HERNIA REPAIR     POLYPECTOMY N/A 04/01/2018   Procedure: POLYPECTOMY;  Surgeon: [name], MD;  Location: [place];  Service: Endoscopy;  Laterality: N/A;   PORT A CATH INJECTION ([place] [facility])     Port has been removed   TUBAL LIGATION      There were no vitals filed for this visit.   Subjective Assessment - 01/13/21 1229     Subjective She s/p fall in a shower on 12/29/2020 resulting in a sacral fracture. No surgery was recommended. Pt states mild-moderate improvement with donut pillow sitting. Pt states that walking is severely painful and is better with inactivity. Pt states that she has noticed that her balance is getting worse 2/2 LE weakness. Pt has h/o of polio which she believes is worsening her fall risk. Pt  has a cane and FWW at home, but is reluctant to use them in the community. Pt stated that her husband is taking time off her to provided assist with ADLs. Pt states that her children x2 live near by.    Pertinent History per referring note: "Patient notes that she does have a history of chronic back pain which did worsen after her fall. She also endorses a history of polio. Over the past month she feels that she has been more off balance and has had more falls. She is also noticing worsening weakness in her legs. She rates her pain as an 8/10. Is intermittent, stiff. She denies any numbness, tingling or loss of control of bowel or bladder. " H/o Hx cancer, previous back surgery, DM, vertigo, Aortic atherosclerosis  ([place]).    Limitations Walking;House hold activities;Sitting;Lifting;Standing    How long can you sit comfortably? Deferred to later tx.    How long can you stand comfortably? Deferred to later tx.    How long can you walk comfortably? Deferred to later tx.    Diagnostic tests MRI of lumbar and cervical spine. See imaging history.    Patient Stated Goals Reduce pain and improve balance.    Currently in Pain? Yes    Pain Score 7     Pain Location Sacrum    Pain Orientation Right;Left    Pain Descriptors / Indicators Aching;Sharp    Pain Type Acute pain    Pain Onset 1 to 4 weeks ago    Pain Frequency Constant    Aggravating Factors  walking, transfers, and seated on a flat surface.    Pain Relieving Factors sitting on the donut, pain medication    Effect of Pain on Daily Activities Greatly reduced indenpendent mobility and increased fall risk.                [facility] PT Assessment - 01/13/21 0001       Assessment   Medical Diagnosis Low back pain, unspecified. Fx of sacrum.    Referring Provider (PT) Girtha Hake MD    Onset Date/Surgical Date 12/29/20    Prior Therapy yes      Precautions   Precautions None      Restrictions   Weight Bearing Restrictions No      Balance Screen   Has the patient fallen in the past 6 months Yes    How many times? 5+    Has the patient had a decrease in activity level because of a fear of falling?  Yes    Is the patient reluctant to leave their home because of a fear of falling?  Yes      [place] Private residence    Living Arrangements Spouse/significant other      Prior Function   Level of Independence Needs assistance with ADLs;Requires assistive device for independence      Cognition   Overall Cognitive Status Within Functional Limits for tasks assessed             SUBJECTIVE Chief complaint: She is s/p MOI of fall in a shower on 12/29/2020 resulting in a sacral fracture. No surgery was  recommended. Pt states mild-moderate improvement with donut siting. Pt states that walking is severely painful and is better with inactivity. Pt states that she has noticed that her balance is getting worse 2/2 LE weakness. Pt has h/o of polio which she believes is worsening her fall risk. Pt has a cane and  FWW at home, but is reluctant to use them in the community. Pt stated that her husband is taking time off her to provided assist with ADLs. Pt states that her children x2 live near by.   History:  per referring note: "Patient notes that she does have a history of chronic back pain which did worsen after her fall. She also endorses a history of polio. Over the past month she feels that she has been more off balance and has had more falls. She is also noticing worsening weakness in her legs. She rates her pain as an 8/10. Is intermittent, stiff. She denies any numbness, tingling or loss of control of bowel or bladder. " H/o Hx cancer, previous back surgery, DM, vertigo, Aortic atherosclerosis ([place]).  Referring Dx: Low back pain, unspecified. Fx of sacrum.   Referring Provider: Girtha Hake MD  Pain location: Lower back and sacrum  Pain: Present 7/10, Best 4/10, Worst 10/10: Pain quality: Sharp, Ache  Radiating pain: Heaviness in the bilat LE.  Numbness/Tingling:  24 hour pain behavior: worse in the mornings.  Aggravating factors: walking, transfers, and seated on a flat surface.  Easing factors: sitting on the donut, pain medication.  How long can you sit: Deferred to later tx.  How long can you stand: Deferred to later tx. How long can you walk: Deferred to later tx. History of back injury, pain, surgery, or therapy:  Follow-up appointment with MD: h/o Polio affecting LLE Imaging:  MRI: 12/30/2020: Marrow edema within the sacrum at the S3 and S4 levels, likely reflecting an acute fracture given the provided history. Please note the caudal extent of the sacrum is not included in the field  of view.   Lumbar spondylosis, as outlined and with findings most notably as follows.   At L3-L4, there is multifactorial bilateral subarticular narrowing (mild right, mild/moderate left) with slight crowding of the descending left L4 nerve root. Mild relative narrowing of the central canal. Mild right neural foraminal narrowing.   At L4-L5, there is multifactorial mild bilateral subarticular narrowing (without appreciable nerve root impingement). Mild right neural foraminal narrowing.   At L5-S1, a broad-based central/right subarticular disc protrusion contributes to moderate right subarticular stenosis, with crowding of the descending right S1 nerve root. The disc protrusion also contributes to mild left subarticular narrowing, and may contact the descending left S1 nerve root. Mild right neural foraminal narrowing.   No more than mild relative spinal canal narrowing at the remaining levels.  Trace grade 1 retrolisthesis at L1-L2, L2-L3 and L3-L4.  Falls in the last 6 months: fall in a shower on 12/29/2020 resulting in a sacral fracture  Occupational demands: Retired  Hobbies: Spending time with family, seated peddle bike, pool exercise Goals: Reduce pain and improve balance.  Red flags (bowel/bladder changes, saddle paresthesia, personal history of cancer, chills/fever, night sweats, unrelenting pain, first onset of insidious LBP <20 y/o) Negative    OBJECTIVE  Mental Status Patient is oriented to person, place and time.  Recent memory is intact.  Remote memory is intact.  Attention span and concentration are intact.  Expressive speech is intact.  Patient's fund of knowledge is within normal limits for educational level.  SENSATION: Grossly intact to light touch bilateral LEs as determined by testing dermatomes L2-S2 Proprioception and hot/cold testing deferred on this date   MUSCULOSKELETAL: Tremor: None Bulk: Normal Tone: Normal No visible step-off along spinal  column  Posture Forward head posture and general kyphotic posture.   Gait Narrow based  stance with small step length, minimal step height, and need for UE assist via hand hold assist or holding onto objects or walls. 5LZ: Pt required CGA-Min A and extensive verbal cueing to keep close to the walker for proper support. FWW: Fall risk was greatly reduced with FWW, however continued to need CGA and verbal cueing to ensure proper approximation of walker.    Palpation - Deferred.    Strength (out of 5) R/L 4/4- Hip flexion 4/4-  Hip abduction 4/4-  Hip adduction 4/3+  Knee extension 4/3+ Knee flexion 4/3+ Ankle dorsiflexion 4/3+ Ankle plantarflexion   *Indicates pain   AROM (degrees) All ROM deferred 2/2 initial focus on balance/gait/strength impairments.    Repeated Movements All ROM deferred 2/2 initial focus on balance/gait/strength impairments.   Muscle Length All ROM deferred 2/2 initial focus on balance/gait/strength impairments.    SPECIAL TESTS All ROM deferred 2/2 initial focus on balance/gait/strength impairments.    Outcome Measure    FOTO: 49 with a predicted improvement value of 56  BERG: 18/56 (significant fall risk)       PT Education - 01/13/21 1234     Education provided Yes    Education Details Proper gait pattern with 4WW and FWW.    Person(s) Educated Patient    Methods Explanation;Demonstration;Tactile cues;Verbal cues    Comprehension Verbalized understanding;Need further instruction;Tactile cues required;Verbal cues required;Returned demonstration                 PT Long Term Goals - 01/13/21 1239       PT LONG TERM GOAL #1   Title Pt will improve MMT scores by 1/2 grade to promote functional strength gains for transfers, amb, and decreased caregiver burden.    Baseline 4/4- Hip flexion  4/4-  Hip abduction  4/4-  Hip adduction  4/3+  Knee extension  4/3+ Knee flexion 4/3+ Ankle dorsiflexion  4/3+ Ankle plantarflexion    Time  8    Period Weeks    Status New    Target Date 03/10/21      PT LONG TERM GOAL #2   Title Pt will improve BERG score by 5 pts to promote safety with ambulation and functional tasks.    Baseline BERG: 18/56    Time 8    Period Weeks    Status New    Target Date 03/10/21      PT LONG TERM GOAL #3   Title Pt will improve FOTO score to predicted improvement value of 56 to measure self reported improvement in functional mobility.    Baseline 36    Time 8    Period Weeks    Status New    Target Date 03/10/21      PT LONG TERM GOAL #4   Title Pt will demonstrates safety with ambulation using FWW with no LOB and only occ verbal cueing and SBA with 10 minutes of overground walking.    Baseline Pt amb 1 min with extensive verbal cueing CGA-min A.    Time 8    Period Weeks    Status New    Target Date 03/10/21      PT LONG TERM GOAL #5   Title Pt will decrease worse pain to 7/10 or less to promote greater access to functional mobility.    Baseline Present 7/10, Best 4/10, Worst 10/10:    Time 8    Period Weeks    Status New    Target Date 03/10/21  Plan - 01/13/21 1236     Clinical Impression Statement Pt is a pleasant 75 year-old female referred for:  Low back pain, unspecified. Fx of sacrum.  PT examination reveals the following deficits: LE MMT (R/L): 4/4- Hip flexion 4/4- Hip abduction 4/4- Hip adduction 4/3+ Knee extension 4/3+ Knee flexion 4/3+ Ankle dorsiflexion 4/3+ Ankle plantarflexion. BERG: 18/56.  FOTO: 36 with predicted improvement value of 56. Gait: Narrow based stance with small step length, minimal step height, and need for UE assist via hand hold assist or holding onto objects or walls. 8CZ: Pt required CGA-Min A and extensive verbal cueing to keep close to the walker for proper support. FWW: Fall risk was greatly reduced with FWW, however continued to need CGA and verbal cueing to ensure proper approximation of walker. Pt was adament that she does not  want to use an AD outside of the house. NPS: Present 7/10, Best 4/10, Worst 10/10.  Pt case displays as moderate complexity with fair prognosis for optimal return to PLOF due to time from onset, current level of daily physically activity, PMH, and PLOF.  Pt will benefit from skilled PT 2x/week for 8 weeks to address deficits and promote safe independence with community and home related mobility and ADLs.    Personal Factors and Comorbidities Comorbidity 3+;Age;Transportation;Fitness    Comorbidities Hx cancer, previous back surgery, DM, vertigo, Aortic atherosclerosis ([place])    Examination-Activity Limitations Stairs;Reach Overhead;Stand;Dressing;Sit;Transfers;Lift;Caring for Others;Carry;Locomotion Level;Squat    Examination-Participation Restrictions Interpersonal Relationship;Cleaning;Laundry;Yard Work;Driving;Meal Prep;Shop    Stability/Clinical Decision Making Evolving/Moderate complexity    Clinical Decision Making Moderate    Rehab Potential Fair    PT Frequency 2x / week    PT Duration 8 weeks    PT Treatment/Interventions ADLs/Self Care Home Management;Aquatic Therapy;Neuromuscular re-education;Balance training;Therapeutic exercise;Therapeutic activities;Functional mobility training;Stair training;Gait training;DME Instruction;Patient/family education;Manual techniques;Energy conservation;Biofeedback;Electrical Stimulation;Moist Heat;Traction;Ultrasound;Dry needling;Passive range of motion;Vestibular;Vasopneumatic Device;Spinal Manipulations;Joint Manipulations    PT Next Visit Plan Issue HEP    PT Home Exercise Plan deferred    Recommended Other Services Possible vestibular assessment 2/2 pt reported dizziness during lateral rotation of the head.    Consulted and Agree with Plan of Care Patient             Patient will benefit from skilled therapeutic intervention in order to improve the following deficits and impairments:  Abnormal gait, Decreased endurance, Decreased activity  tolerance, Decreased strength, Pain, Difficulty walking, Decreased mobility, Decreased balance, Decreased range of motion, Improper body mechanics, Postural dysfunction, Decreased safety awareness, Decreased coordination, Decreased knowledge of precautions, Decreased knowledge of use of DME, Dizziness, Hypomobility, Impaired flexibility, Impaired sensation, Impaired perceived functional ability  Visit Diagnosis: Bilateral low back pain, unspecified chronicity, unspecified whether sciatica present  Closed fracture of sacrum and coccyx, sequela  Bilateral leg weakness  Polio osteopathy of lower leg, left (HCC)  At high risk for injury related to fall     Problem List Patient Active Problem List   Diagnosis Date Noted   Aortic atherosclerosis ([place]) 12/29/2020   Deformity of toe of left foot 03/28/2020   H/O multiple pulmonary nodules 09/22/2019   Chronic cough 09/22/2019   Perennial allergic rhinitis 09/17/2019   Acute upper respiratory infection 08/28/2019   Weakness 08/28/2019   Acute bronchitis 06/01/2019   Cough productive of yellow sputum 06/01/2019   Vasomotor rhinitis 05/01/2019   Chronic kidney disease, stage II (mild) 03/14/2019   Gastroesophageal reflux disease without esophagitis 03/14/2019   Intractable episodic headache 03/14/2019   Need for prophylactic vaccination with  combined diphtheria-tetanus-pertussis (DTaP) vaccine 03/14/2019   Renal cyst 11/27/2018   Abnormality of gait and mobility 11/27/2018   Hepatic steatosis 10/31/2018   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/31/2018   Elevated ferritin 10/31/2018   Vertigo 08/13/2018   Gastroenteritis 05/09/2018   Diarrhea 05/09/2018   Nausea 05/09/2018   Personal history of colon cancer    Polyp of sigmoid colon    Dysuria 02/23/2018   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 02/10/2018   Encounter for general adult medical examination with abnormal findings 02/10/2018   Acute non-recurrent  pansinusitis 08/24/2017   Other headache syndrome 08/24/2017   Uncontrolled type 2 diabetes mellitus with hyperglycemia ([place]) 08/24/2017   Type 2 diabetes mellitus without complication, with long-term current use of insulin ([place]) 08/22/2017   Need for vaccination against Streptococcus pneumoniae using pneumococcal conjugate vaccine 13 08/22/2017   Essential hypertension 05/03/2017   Otalgia, bilateral 05/02/2017   Type 2 diabetes mellitus with hyperglycemia ([place]) 05/02/2017   Mixed hyperlipidemia 05/02/2017   Right lower quadrant pain 06/29/2016   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   History of colon cancer 04/13/2015   Cancer of ascending colon ([place]) 10/12/2014   Pura Spice, PT, DPT # [id number] Fara Olden, SPT 01/13/2021, 1:36 PM  [place] [facility] [address]. [place], [place], [address] Phone: [phone]   Fax:  [phone]  Name: LAVANA HUCKEBA MRN: [id number] Date of Birth: 28-Oct-1945

## 2021-01-17 ENCOUNTER — Ambulatory Visit: Payer: Medicare HMO | Attending: Physical Medicine & Rehabilitation | Admitting: Physical Therapy

## 2021-01-17 ENCOUNTER — Encounter: Payer: Self-pay | Admitting: Physical Therapy

## 2021-01-17 ENCOUNTER — Other Ambulatory Visit: Payer: Self-pay

## 2021-01-17 DIAGNOSIS — B91 Sequelae of poliomyelitis: Secondary | ICD-10-CM | POA: Diagnosis not present

## 2021-01-17 DIAGNOSIS — S322XXS Fracture of coccyx, sequela: Secondary | ICD-10-CM | POA: Diagnosis not present

## 2021-01-17 DIAGNOSIS — M89662 Osteopathy after poliomyelitis, left lower leg: Secondary | ICD-10-CM | POA: Insufficient documentation

## 2021-01-17 DIAGNOSIS — S3210XS Unspecified fracture of sacrum, sequela: Secondary | ICD-10-CM | POA: Insufficient documentation

## 2021-01-17 DIAGNOSIS — Z9181 History of falling: Secondary | ICD-10-CM | POA: Insufficient documentation

## 2021-01-17 DIAGNOSIS — M545 Low back pain, unspecified: Secondary | ICD-10-CM | POA: Insufficient documentation

## 2021-01-17 DIAGNOSIS — R29898 Other symptoms and signs involving the musculoskeletal system: Secondary | ICD-10-CM | POA: Diagnosis not present

## 2021-01-17 NOTE — Therapy (Signed)
[facility] [address] [place], [place], [address] Phone: [phone]   Fax:  [phone]  Physical Therapy Treatment  Patient Details  Name: Tiffany Hawkins MRN: [id number] Date of Birth: 02-03-1946 Referring Provider (PT): Girtha Hake MD   Encounter Date: 01/17/2021   PT End of Session - 01/18/21 0829     Visit Number 2    Number of Visits 17    Date for PT Re-Evaluation 03/10/21    Authorization - Visit Number 2    Authorization - Number of Visits 10    Progress Note Due on Visit 10    PT Start Time 3810    PT Stop Time 1751    PT Time Calculation (min) 53 min    Equipment Utilized During Treatment Gait belt;Other (comment)   [phone] and FWW   Activity Tolerance Patient tolerated treatment well;Patient limited by fatigue;Patient limited by pain    Behavior During Therapy [facility] for tasks assessed/performed             Past Medical History:  Diagnosis Date   Anxiety    Colon cancer ([place]) 10/12/2014   Stage IIB; had chemo   Diabetes mellitus without complication ([place])    Hyperlipidemia    Hypertension    Lateral epicondylitis    Personal history of chemotherapy    Polio    childhood   Skin cancer    Sqamous Cell, Lt Calf   Sleep apnea    no CPAP, sx improved with wt loss    Past Surgical History:  Procedure Laterality Date   APPENDECTOMY     BACK SURGERY     CATARACT EXTRACTION W/PHACO Left 12/03/2017   Procedure: CATARACT EXTRACTION PHACO AND INTRAOCULAR LENS PLACEMENT ([place]) LEFT  DIABETIC;  Surgeon: Eulogio Bear, MD;  Location: [place];  Service: Ophthalmology;  Laterality: Left;  Diabetic - insulin   CATARACT EXTRACTION W/PHACO Right 01/14/2018   Procedure: CATARACT EXTRACTION PHACO AND INTRAOCULAR LENS PLACEMENT ([facility]) RIGHT DIABETIC;  Surgeon: Eulogio Bear, MD;  Location: [place];  Service: Ophthalmology;  Laterality: Right;  Diabetic - insulin   CERVICAL DISC SURGERY      CHOLECYSTECTOMY     COLON SURGERY     COLONOSCOPY     COLONOSCOPY WITH PROPOFOL N/A 05/17/2015   Procedure: COLONOSCOPY WITH PROPOFOL;  Surgeon: Manya Silvas, MD;  Location: [facility] ENDOSCOPY;  Service: Endoscopy;  Laterality: N/A;   COLONOSCOPY WITH PROPOFOL N/A 04/01/2018   Procedure: COLONOSCOPY WITH BIOPSY;  Surgeon: [name], MD;  Location: [place];  Service: Endoscopy;  Laterality: N/A;  Diabetic - insulin   ESOPHAGOGASTRODUODENOSCOPY     HERNIA REPAIR     POLYPECTOMY N/A 04/01/2018   Procedure: POLYPECTOMY;  Surgeon: [name], MD;  Location: [place];  Service: Endoscopy;  Laterality: N/A;   PORT A CATH INJECTION ([place] HX)     Port has been removed   TUBAL LIGATION      There were no vitals filed for this visit.   Subjective Assessment - 01/18/21 0821     Subjective Pt. reports no new falls since PT initial evaluation.  Pt. entered PT with assist from husband (no RW upon entry into PT clinic).  Pt. using wall to assist balance/ walking pattern.    Pertinent History per referring note: "Patient notes that she does have a history of chronic back pain which did worsen after her fall. She also endorses a history of  polio. Over the past month she feels that she has been more off balance and has had more falls. She is also noticing worsening weakness in her legs. She rates her pain as an 8/10. Is intermittent, stiff. She denies any numbness, tingling or loss of control of bowel or bladder. " H/o Hx cancer, previous back surgery, DM, vertigo, Aortic atherosclerosis ([place]).    Limitations Walking;House hold activities;Sitting;Lifting;Standing    How long can you sit comfortably? Deferred to later tx.    How long can you stand comfortably? Deferred to later tx.    How long can you walk comfortably? Deferred to later tx.    Diagnostic tests MRI of lumbar and cervical spine. See imaging history.    Patient Stated Goals Reduce pain and improve balance.     Currently in Pain? Yes    Pain Score 5     Pain Location Sacrum    Pain Orientation Right;Left    Pain Descriptors / Indicators Aching    Pain Onset 1 to 4 weeks ago              Neuro:  Walking in clinic with use of RW and gait belt.  CGA to min. A for safety and verbal cuing.  Pt. Easily fatigues and requires contact cuing to correct RW position/ use to prevent forward lean.    Standing 6" step touches with B UE assist and progressing to 1 UE assist on handrail 20x.  Moderate B LE muscle fatigue.    Forward/lateral walking in //-bars 2x.  Light to no UE assist.  Walking with alt. UE/LE touches in //-bars with 1 UE in contact with //-bars at all time.  4 laps.  Turning in //-bars with focus on proper technique/upright posture 3x.    Walking on Airex/ standing wt. Shifting/ marching 20x.  Mirror feedback for posture correction.  Standing cone taps with L/R LE and min. To no UE assist 10x (difficulty with increase step length).      Therex:  Access Code: [email or web address] URL: [email or web address] Date: 01/17/2021   Prepared by: Dorcas Carrow Exercises Supine Bridge - 1 x daily - 5 x weekly - 3 sets - 10 reps Supine Active Straight Leg Raise - 1 x daily - 5 x weekly - 3 sets - 10 reps Hooklying Clamshell with Resistance - 1 x daily - 5 x weekly - 3 sets - 10 reps Seated March - 1 x daily - 5 x weekly - 3 sets - 10 reps Seated Long Arc Quad - 1 x daily - 5 x weekly - 3 sets - 10 reps    Nustep L0 10 min. B UE/LE (consistent cadence/ no rest breaks).       PT Education - 01/18/21 0829     Education provided Yes    Education Details Access Code: [email or web address]    Person(s) Educated Patient    Methods Explanation;Demonstration;Handout    Comprehension Verbalized understanding;Returned demonstration                PT Long Term Goals - 01/13/21 1239       PT LONG TERM GOAL #1   Title Pt will improve MMT scores by 1/2 grade to promote functional strength gains  for transfers, amb, and decreased caregiver burden.    Baseline 4/4- Hip flexion  4/4-  Hip abduction  4/4-  Hip adduction  4/3+  Knee extension  4/3+ Knee flexion 4/3+ Ankle dorsiflexion  4/3+ Ankle plantarflexion    Time 8  Period Weeks    Status New    Target Date 03/10/21      PT LONG TERM GOAL #2   Title Pt will improve BERG score by 5 pts to promote safety with ambulation and functional tasks.    Baseline BERG: 18/56    Time 8    Period Weeks    Status New    Target Date 03/10/21      PT LONG TERM GOAL #3   Title Pt will improve FOTO score to predicted improvement value of 56 to measure self reported improvement in functional mobility.    Baseline 36    Time 8    Period Weeks    Status New    Target Date 03/10/21      PT LONG TERM GOAL #4   Title Pt will demonstrates safety with ambulation using FWW with no LOB and only occ verbal cueing and SBA with 10 minutes of overground walking.    Baseline Pt amb 1 min with extensive verbal cueing CGA-min A.    Time 8    Period Weeks    Status New    Target Date 03/10/21      PT LONG TERM GOAL #5   Title Pt will decrease worse pain to 7/10 or less to promote greater access to functional mobility.    Baseline Present 7/10, Best 4/10, Worst 10/10:    Time 8    Period Weeks    Status New    Target Date 03/10/21                   Plan - 01/18/21 0830     Clinical Impression Statement Pt. ambulates with moderate antalgic gait pattern with limited hip flexion/ step pattern and requires CGA for safety.  PT instructed pt on the importance of using an assistive device to decrease fall risk and issued a supine/ seated based LE ex. program.  Pt. able to complete ex. program but requires extra time/cuing to prevent an increase in back pain during bridging ex.  UE assist required during all standing/ balance ex. in //-bars.  No LOB during tx. session and use of gait belt with all standing tasks for safety.  Moderate LE muscle  fatigue and cuing to increase hip flexion/ heel strike during swing through phase of gait.  Cuing to increase L step length past R LE.    Personal Factors and Comorbidities Comorbidity 3+;Age;Transportation;Fitness    Comorbidities Hx cancer, previous back surgery, DM, vertigo, Aortic atherosclerosis ([place])    Examination-Activity Limitations Stairs;Reach Overhead;Stand;Dressing;Sit;Transfers;Lift;Caring for Others;Carry;Locomotion Level;Squat    Examination-Participation Restrictions Interpersonal Relationship;Cleaning;Laundry;Yard Work;Driving;Meal Prep;Shop    Stability/Clinical Decision Making Evolving/Moderate complexity    Clinical Decision Making Moderate    Rehab Potential Fair    PT Frequency 2x / week    PT Duration 8 weeks    PT Treatment/Interventions ADLs/Self Care Home Management;Aquatic Therapy;Neuromuscular re-education;Balance training;Therapeutic exercise;Therapeutic activities;Functional mobility training;Stair training;Gait training;DME Instruction;Patient/family education;Manual techniques;Energy conservation;Biofeedback;Electrical Stimulation;Moist Heat;Traction;Ultrasound;Dry needling;Passive range of motion;Vestibular;Vasopneumatic Device;Spinal Manipulations;Joint Manipulations    PT Next Visit Plan Reassess HEP/ dynamic balance ex.    PT Home Exercise Plan deferred    Consulted and Agree with Plan of Care Patient             Patient will benefit from skilled therapeutic intervention in order to improve the following deficits and impairments:  Abnormal gait, Decreased endurance, Decreased activity tolerance, Decreased strength, Pain, Difficulty walking, Decreased mobility, Decreased balance, Decreased range of motion, Improper  body mechanics, Postural dysfunction, Decreased safety awareness, Decreased coordination, Decreased knowledge of precautions, Decreased knowledge of use of DME, Dizziness, Hypomobility, Impaired flexibility, Impaired sensation, Impaired perceived  functional ability  Visit Diagnosis: Bilateral low back pain, unspecified chronicity, unspecified whether sciatica present  Closed fracture of sacrum and coccyx, sequela  Bilateral leg weakness  Polio osteopathy of lower leg, left ([place])  At high risk for injury related to fall     Problem List Patient Active Problem List   Diagnosis Date Noted   Aortic atherosclerosis ([place]) 12/29/2020   Deformity of toe of left foot 03/28/2020   H/O multiple pulmonary nodules 09/22/2019   Chronic cough 09/22/2019   Perennial allergic rhinitis 09/17/2019   Acute upper respiratory infection 08/28/2019   Weakness 08/28/2019   Acute bronchitis 06/01/2019   Cough productive of yellow sputum 06/01/2019   Vasomotor rhinitis 05/01/2019   Chronic kidney disease, stage II (mild) 03/14/2019   Gastroesophageal reflux disease without esophagitis 03/14/2019   Intractable episodic headache 03/14/2019   Need for prophylactic vaccination with combined diphtheria-tetanus-pertussis (DTaP) vaccine 03/14/2019   Renal cyst 11/27/2018   Abnormality of gait and mobility 11/27/2018   Hepatic steatosis 10/31/2018   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[place]) 10/31/2018   Elevated ferritin 10/31/2018   Vertigo 08/13/2018   Gastroenteritis 05/09/2018   Diarrhea 05/09/2018   Nausea 05/09/2018   Personal history of colon cancer    Polyp of sigmoid colon    Dysuria 02/23/2018   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 02/10/2018   Encounter for general adult medical examination with abnormal findings 02/10/2018   Acute non-recurrent pansinusitis 08/24/2017   Other headache syndrome 08/24/2017   Uncontrolled type 2 diabetes mellitus with hyperglycemia ([place]) 08/24/2017   Type 2 diabetes mellitus without complication, with long-term current use of insulin ([place]) 08/22/2017   Need for vaccination against Streptococcus pneumoniae using pneumococcal conjugate vaccine 13 08/22/2017   Essential hypertension 05/03/2017    Otalgia, bilateral 05/02/2017   Type 2 diabetes mellitus with hyperglycemia ([place]) 05/02/2017   Mixed hyperlipidemia 05/02/2017   Right lower quadrant pain 06/29/2016   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   History of colon cancer 04/13/2015   Cancer of ascending colon ([place]) 10/12/2014   Pura Spice, PT, DPT # [id number] 01/18/2021, 8:36 AM  [place] [facility] [address] [place], [place], [address] Phone: [phone]   Fax:  [phone]  Name: Tiffany Hawkins MRN: [id number] Date of Birth: March 14, 1946

## 2021-01-18 ENCOUNTER — Encounter (INDEPENDENT_AMBULATORY_CARE_PROVIDER_SITE_OTHER): Payer: Self-pay

## 2021-01-18 ENCOUNTER — Ambulatory Visit: Payer: Medicare HMO | Admitting: Physical Therapy

## 2021-01-18 DIAGNOSIS — G4733 Obstructive sleep apnea (adult) (pediatric): Secondary | ICD-10-CM | POA: Diagnosis not present

## 2021-01-18 NOTE — Patient Instructions (Signed)
Access Code: [email or web address] URL: [email or web address] Date: 01/17/2021 Prepared by: Dorcas Carrow Exercises Supine Bridge - 1 x daily - 5 x weekly - 3 sets - 10 reps Supine Active Straight Leg Raise - 1 x daily - 5 x weekly - 3 sets - 10 reps Hooklying Clamshell with Resistance - 1 x daily - 5 x weekly - 3 sets - 10 reps Seated March - 1 x daily - 5 x weekly - 3 sets - 10 reps Seated Long Arc Quad - 1 x daily - 5 x weekly - 3 sets - 10 reps

## 2021-01-19 ENCOUNTER — Other Ambulatory Visit: Payer: Self-pay

## 2021-01-19 ENCOUNTER — Ambulatory Visit (INDEPENDENT_AMBULATORY_CARE_PROVIDER_SITE_OTHER): Payer: Medicare HMO

## 2021-01-19 ENCOUNTER — Ambulatory Visit: Payer: Medicare HMO

## 2021-01-19 DIAGNOSIS — G4733 Obstructive sleep apnea (adult) (pediatric): Secondary | ICD-10-CM | POA: Diagnosis not present

## 2021-01-19 NOTE — Progress Notes (Signed)
95 percentile pressure 12.2   95th percentile leak 21   apnea index 2.8 /hr  apnea-hypopnea index  3.0 /hr   total days used  >4 hr 85 days  total days used <4 hr 5 days  Total compliance 94 percent       Pt was seen by Claiborne Billings from [facility]

## 2021-01-20 ENCOUNTER — Encounter: Payer: Self-pay | Admitting: Physical Therapy

## 2021-01-20 ENCOUNTER — Ambulatory Visit: Payer: Medicare HMO | Admitting: Physical Therapy

## 2021-01-20 ENCOUNTER — Encounter: Payer: Medicare HMO | Admitting: Physical Therapy

## 2021-01-20 DIAGNOSIS — M89662 Osteopathy after poliomyelitis, left lower leg: Secondary | ICD-10-CM | POA: Diagnosis not present

## 2021-01-20 DIAGNOSIS — S3210XS Unspecified fracture of sacrum, sequela: Secondary | ICD-10-CM | POA: Diagnosis not present

## 2021-01-20 DIAGNOSIS — G4733 Obstructive sleep apnea (adult) (pediatric): Secondary | ICD-10-CM | POA: Diagnosis not present

## 2021-01-20 DIAGNOSIS — R29898 Other symptoms and signs involving the musculoskeletal system: Secondary | ICD-10-CM | POA: Diagnosis not present

## 2021-01-20 DIAGNOSIS — S322XXS Fracture of coccyx, sequela: Secondary | ICD-10-CM | POA: Diagnosis not present

## 2021-01-20 DIAGNOSIS — M545 Low back pain, unspecified: Secondary | ICD-10-CM

## 2021-01-20 DIAGNOSIS — B91 Sequelae of poliomyelitis: Secondary | ICD-10-CM

## 2021-01-20 DIAGNOSIS — Z9181 History of falling: Secondary | ICD-10-CM

## 2021-01-20 NOTE — Therapy (Signed)
[facility] [address] [place], [place], [address] Phone: [phone]   Fax:  [phone]  Physical Therapy Treatment  Patient Details  Name: Tiffany Hawkins MRN: [id number] Date of Birth: 08-Jul-1945 Referring Provider (PT): Girtha Hake MD   Encounter Date: 01/20/2021   Treatment: 3 of 17.  Recert date: 38/46/6599 1114 to 1203   Past Medical History:  Diagnosis Date   Anxiety    Colon cancer ([place]) 10/12/2014   Stage IIB; had chemo   Diabetes mellitus without complication ([facility])    Hyperlipidemia    Hypertension    Lateral epicondylitis    Personal history of chemotherapy    Polio    childhood   Skin cancer    Sqamous Cell, Lt Calf   Sleep apnea    no CPAP, sx improved with wt loss    Past Surgical History:  Procedure Laterality Date   APPENDECTOMY     BACK SURGERY     CATARACT EXTRACTION W/PHACO Left 12/03/2017   Procedure: CATARACT EXTRACTION PHACO AND INTRAOCULAR LENS PLACEMENT ([place]) LEFT  DIABETIC;  Surgeon: Eulogio Bear, MD;  Location: [place];  Service: Ophthalmology;  Laterality: Left;  Diabetic - insulin   CATARACT EXTRACTION W/PHACO Right 01/14/2018   Procedure: CATARACT EXTRACTION PHACO AND INTRAOCULAR LENS PLACEMENT ([facility]) RIGHT DIABETIC;  Surgeon: Eulogio Bear, MD;  Location: [place];  Service: Ophthalmology;  Laterality: Right;  Diabetic - insulin   CERVICAL DISC SURGERY     CHOLECYSTECTOMY     COLON SURGERY     COLONOSCOPY     COLONOSCOPY WITH PROPOFOL N/A 05/17/2015   Procedure: COLONOSCOPY WITH PROPOFOL;  Surgeon: Manya Silvas, MD;  Location: [facility] ENDOSCOPY;  Service: Endoscopy;  Laterality: N/A;   COLONOSCOPY WITH PROPOFOL N/A 04/01/2018   Procedure: COLONOSCOPY WITH BIOPSY;  Surgeon: [name], MD;  Location: [place];  Service: Endoscopy;  Laterality: N/A;  Diabetic - insulin   ESOPHAGOGASTRODUODENOSCOPY     HERNIA REPAIR     POLYPECTOMY N/A  04/01/2018   Procedure: POLYPECTOMY;  Surgeon: [name], MD;  Location: [place];  Service: Endoscopy;  Laterality: N/A;   PORT A CATH INJECTION ([place] HX)     Port has been removed   TUBAL LIGATION      There were no vitals filed for this visit.   Pt. reports 8/10 R upper thigh pain currently. Pt. states the pain is higher because she is doing more. Pt. reports sleeping alright last night but woke up a couple times because leg was hurting. No falls      Ther.ex.:  Supine LE/ lumbar generalized stretches.  Pain with trunk rotn. And STM to R upper thigh/ ASIS joint.    Bolster bridging/ SAQ/ LAQ 20x each.    Nustep L0 B UE/LE   Neuro.mm.:  Sit to stands from mat table 10x2 (cuing for proper technique/ upright posture).   Walking with RW and mirror feedback in gym (limited hip/knee flexion).  Poor endurance/ seated rest breaks.    Extra time to stand with RW.  PT discussed blood sugar (MD f/u on 10/18).    Walking in //-bars with turning CW/CCW.           PT Long Term Goals - 01/13/21 1239       PT LONG TERM GOAL #1   Title Pt will improve MMT scores by 1/2 grade to promote functional strength gains for transfers, amb, and decreased caregiver  burden.    Baseline 4/4- Hip flexion  4/4-  Hip abduction  4/4-  Hip adduction  4/3+  Knee extension  4/3+ Knee flexion 4/3+ Ankle dorsiflexion  4/3+ Ankle plantarflexion    Time 8    Period Weeks    Status New    Target Date 03/10/21      PT LONG TERM GOAL #2   Title Pt will improve BERG score by 5 pts to promote safety with ambulation and functional tasks.    Baseline BERG: 18/56    Time 8    Period Weeks    Status New    Target Date 03/10/21      PT LONG TERM GOAL #3   Title Pt will improve FOTO score to predicted improvement value of 56 to measure self reported improvement in functional mobility.    Baseline 36    Time 8    Period Weeks    Status New    Target Date 03/10/21      PT LONG TERM  GOAL #4   Title Pt will demonstrates safety with ambulation using FWW with no LOB and only occ verbal cueing and SBA with 10 minutes of overground walking.    Baseline Pt amb 1 min with extensive verbal cueing CGA-min A.    Time 8    Period Weeks    Status New    Target Date 03/10/21      PT LONG TERM GOAL #5   Title Pt will decrease worse pain to 7/10 or less to promote greater access to functional mobility.    Baseline Present 7/10, Best 4/10, Worst 10/10:    Time 8    Period Weeks    Status New    Target Date 03/10/21                   Plan - 01/22/21 1848     Clinical Impression Statement Pt. presents with increase back/ leg pain during tx. session.  Extra time to return to standing from mat table and initiate walking.  Pt. ambulates with limited hip/knee flexion during swing through phase of gait with RW.  Cuing for proper RW placement with standing and walking to prevent forward leaning, esp. with thresholds/ outside walking.  Poor balance and CGA required with all standing and walking tasks.  No change to HEP.    Personal Factors and Comorbidities Comorbidity 3+;Age;Transportation;Fitness    Comorbidities Hx cancer, previous back surgery, DM, vertigo, Aortic atherosclerosis ([place])    Examination-Activity Limitations Stairs;Reach Overhead;Stand;Dressing;Sit;Transfers;Lift;Caring for Others;Carry;Locomotion Level;Squat    Examination-Participation Restrictions Interpersonal Relationship;Cleaning;Laundry;Yard Work;Driving;Meal Prep;Shop    Stability/Clinical Decision Making Evolving/Moderate complexity    Clinical Decision Making Moderate    Rehab Potential Fair    PT Frequency 2x / week    PT Duration 8 weeks    PT Treatment/Interventions ADLs/Self Care Home Management;Aquatic Therapy;Neuromuscular re-education;Balance training;Therapeutic exercise;Therapeutic activities;Functional mobility training;Stair training;Gait training;DME Instruction;Patient/family  education;Manual techniques;Energy conservation;Biofeedback;Electrical Stimulation;Moist Heat;Traction;Ultrasound;Dry needling;Passive range of motion;Vestibular;Vasopneumatic Device;Spinal Manipulations;Joint Manipulations    PT Next Visit Plan Reassess HEP/ dynamic balance ex.    PT Home Exercise Plan deferred    Consulted and Agree with Plan of Care Patient             Patient will benefit from skilled therapeutic intervention in order to improve the following deficits and impairments:  Abnormal gait, Decreased endurance, Decreased activity tolerance, Decreased strength, Pain, Difficulty walking, Decreased mobility, Decreased balance, Decreased range of motion, Improper body mechanics, Postural dysfunction,  Decreased safety awareness, Decreased coordination, Decreased knowledge of precautions, Decreased knowledge of use of DME, Dizziness, Hypomobility, Impaired flexibility, Impaired sensation, Impaired perceived functional ability  Visit Diagnosis: Bilateral low back pain, unspecified chronicity, unspecified whether sciatica present  Closed fracture of sacrum and coccyx, sequela  Bilateral leg weakness  Polio osteopathy of lower leg, left ([place])  At high risk for injury related to fall     Problem List Patient Active Problem List   Diagnosis Date Noted   Aortic atherosclerosis ([place]) 12/29/2020   Deformity of toe of left foot 03/28/2020   H/O multiple pulmonary nodules 09/22/2019   Chronic cough 09/22/2019   Perennial allergic rhinitis 09/17/2019   Acute upper respiratory infection 08/28/2019   Weakness 08/28/2019   Acute bronchitis 06/01/2019   Cough productive of yellow sputum 06/01/2019   Vasomotor rhinitis 05/01/2019   Chronic kidney disease, stage II (mild) 03/14/2019   Gastroesophageal reflux disease without esophagitis 03/14/2019   Intractable episodic headache 03/14/2019   Need for prophylactic vaccination with combined diphtheria-tetanus-pertussis (DTaP) vaccine  03/14/2019   Renal cyst 11/27/2018   Abnormality of gait and mobility 11/27/2018   Hepatic steatosis 10/31/2018   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[place]) 10/31/2018   Elevated ferritin 10/31/2018   Vertigo 08/13/2018   Gastroenteritis 05/09/2018   Diarrhea 05/09/2018   Nausea 05/09/2018   Personal history of colon cancer    Polyp of sigmoid colon    Dysuria 02/23/2018   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 02/10/2018   Encounter for general adult medical examination with abnormal findings 02/10/2018   Acute non-recurrent pansinusitis 08/24/2017   Other headache syndrome 08/24/2017   Uncontrolled type 2 diabetes mellitus with hyperglycemia ([place]) 08/24/2017   Type 2 diabetes mellitus without complication, with long-term current use of insulin ([place]) 08/22/2017   Need for vaccination against Streptococcus pneumoniae using pneumococcal conjugate vaccine 13 08/22/2017   Essential hypertension 05/03/2017   Otalgia, bilateral 05/02/2017   Type 2 diabetes mellitus with hyperglycemia ([place]) 05/02/2017   Mixed hyperlipidemia 05/02/2017   Right lower quadrant pain 06/29/2016   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   History of colon cancer 04/13/2015   Cancer of ascending colon ([place]) 10/12/2014   Pura Spice, PT, DPT # [id number] 01/22/2021, 6:52 PM  [place] [facility] [address] [place], [place], [address] Phone: [phone]   Fax:  [phone]  Name: Tiffany Hawkins MRN: [id number] Date of Birth: Sep 06, 1945

## 2021-01-23 ENCOUNTER — Encounter (INDEPENDENT_AMBULATORY_CARE_PROVIDER_SITE_OTHER): Payer: Self-pay | Admitting: Nurse Practitioner

## 2021-01-23 NOTE — H&P (View-Only) (Signed)
Subjective:    Patient ID: Tiffany Hawkins, female    DOB: March 28, 1946, 75 y.o.   MRN: [id number] Chief Complaint  Patient presents with   Follow-up    Ultrasound follow up    Tiffany Hawkins is a 75 y.o. female.  She returns today for follow-up studies after her CT scan had evidence of aortoiliac calcification.  Because the CT scan was noncontrast it was difficult to discern the degree of stenosis.  It also did not allow for any evaluation of her infrarenal circulation.  The patient notes that she does have leg pain with activity.  She can only walk because her legs to tire and give out.  The patient also notes that she has some balance issues as well and was recommended to begin working with physical therapy.  She denies any rest pain or open wounds or ulcerations.  Her CT scan also previously demonstrated large pelvic varicosities with a very large gonadal vein coming off of the left renal artery that is consistent with possible pelvic congestion syndrome.    Today noninvasive studies show an ABI 1.19 on the right and 1.20 on the left.  She has strong triphasic tibial artery waveforms bilaterally with good toe waveforms bilaterally.     Review of Systems  All other systems reviewed and are negative.     Objective:   Physical Exam Vitals reviewed.  HENT:     Head: Normocephalic.  Cardiovascular:     Rate and Rhythm: Normal rate.     Pulses: Normal pulses.  Pulmonary:     Effort: Pulmonary effort is normal.  Skin:    General: Skin is warm and dry.     Capillary Refill: Capillary refill takes 2 to 3 seconds.  Neurological:     Mental Status: She is alert and oriented to person, place, and time.  Psychiatric:        Mood and Affect: Mood normal.        Behavior: Behavior normal.        Thought Content: Thought content normal.        Judgment: Judgment normal.    BP (!) 143/83 (BP Location: Right Arm)   Pulse 78   Resp 16   Wt 158 lb (71.7 kg)   BMI 28.90 kg/m   Past  Medical History:  Diagnosis Date   Anxiety    Colon cancer ([place]) 10/12/2014   Stage IIB; had chemo   Diabetes mellitus without complication (HCC)    Hyperlipidemia    Hypertension    Lateral epicondylitis    Personal history of chemotherapy    Polio    childhood   Skin cancer    Sqamous Cell, Lt Calf   Sleep apnea    no CPAP, sx improved with wt loss    Social History   Socioeconomic History   Marital status: Married    Spouse name: Not on file   Number of children: Not on file   Years of education: Not on file   Highest education level: Not on file  Occupational History   Not on file  Tobacco Use   Smoking status: Former    Packs/day: 1.50    Years: 20.00    Pack years: 30.00    Types: Cigarettes    Quit date: 04/17/1981    Years since quitting: 39.7   Smokeless tobacco: Never  Vaping Use   Vaping Use: Never used  Substance and Sexual Activity   Alcohol use: No  Drug use: No   Sexual activity: Yes  Other Topics Concern   Not on file  Social History Narrative   Not on file   Social Determinants of Health   Financial Resource Strain: Not on file  Food Insecurity: Not on file  Transportation Needs: Not on file  Physical Activity: Not on file  Stress: Not on file  Social Connections: Not on file  Intimate Partner Violence: Not on file    Past Surgical History:  Procedure Laterality Date   APPENDECTOMY     BACK SURGERY     CATARACT EXTRACTION W/PHACO Left 12/03/2017   Procedure: CATARACT EXTRACTION PHACO AND INTRAOCULAR LENS PLACEMENT ([place]) LEFT  DIABETIC;  Surgeon: Eulogio Bear, MD;  Location: [place];  Service: Ophthalmology;  Laterality: Left;  Diabetic - insulin   CATARACT EXTRACTION W/PHACO Right 01/14/2018   Procedure: CATARACT EXTRACTION PHACO AND INTRAOCULAR LENS PLACEMENT ([facility]) RIGHT DIABETIC;  Surgeon: Eulogio Bear, MD;  Location: [place];  Service: Ophthalmology;  Laterality: Right;  Diabetic - insulin    CERVICAL DISC SURGERY     CHOLECYSTECTOMY     COLON SURGERY     COLONOSCOPY     COLONOSCOPY WITH PROPOFOL N/A 05/17/2015   Procedure: COLONOSCOPY WITH PROPOFOL;  Surgeon: Manya Silvas, MD;  Location: [facility] ENDOSCOPY;  Service: Endoscopy;  Laterality: N/A;   COLONOSCOPY WITH PROPOFOL N/A 04/01/2018   Procedure: COLONOSCOPY WITH BIOPSY;  Surgeon: [name], MD;  Location: [place];  Service: Endoscopy;  Laterality: N/A;  Diabetic - insulin   ESOPHAGOGASTRODUODENOSCOPY     HERNIA REPAIR     POLYPECTOMY N/A 04/01/2018   Procedure: POLYPECTOMY;  Surgeon: [name], MD;  Location: [place];  Service: Endoscopy;  Laterality: N/A;   PORT A CATH INJECTION ([place] [facility])     Port has been removed   TUBAL LIGATION      Family History  Problem Relation Age of Onset   Alzheimer's disease Mother    Heart disease Father    Colon cancer Cousin    Colon cancer Cousin    Cancer Cousin        Brain Tumor   Breast cancer Neg Hx     Allergies  Allergen Reactions   Lisinopril Cough    CBC Latest Ref Rng & Units 10/07/2020 05/25/2020 09/22/2019  WBC 4.0 - 10.5 K/uL 7.3 7.4 8.5  Hemoglobin 12.0 - 15.0 g/dL 14.5 15.7 13.8  Hematocrit 36.0 - 46.0 % 44.9 47.3(H) 40.8  Platelets 150 - 400 K/uL 195 220 193      CMP     Component Value Date/Time   NA 140 10/07/2020 1351   NA 145 (H) 05/25/2020 0854   NA 137 12/23/2013 1045   K 4.1 10/07/2020 1351   K 4.3 02/03/2014 1153   CL 107 10/07/2020 1351   CL 102 12/23/2013 1045   CO2 27 10/07/2020 1351   CO2 27 12/23/2013 1045   GLUCOSE 141 (H) 10/07/2020 1351   GLUCOSE 169 (H) 12/23/2013 1045   BUN 23 10/07/2020 1351   BUN 17 05/25/2020 0854   BUN 31 (H) 12/23/2013 1045   CREATININE 0.96 10/07/2020 1351   CREATININE 1.20 04/28/2014 1058   CALCIUM 9.5 10/07/2020 1351   CALCIUM 9.7 12/23/2013 1045   PROT 7.6 10/07/2020 1351   PROT 7.3 05/25/2020 0854   PROT 7.2 07/21/2014 1127   ALBUMIN 4.3 10/07/2020 1351   ALBUMIN 4.7  05/25/2020 0854   ALBUMIN 4.3 07/21/2014 1127  AST 21 10/07/2020 1351   AST 31 07/21/2014 1127   ALT 27 10/07/2020 1351   ALT 43 07/21/2014 1127   ALKPHOS 41 10/07/2020 1351   ALKPHOS 37 (L) 07/21/2014 1127   BILITOT 0.3 10/07/2020 1351   BILITOT 0.2 05/25/2020 0854   BILITOT 0.2 (L) 07/21/2014 1127   GFRNONAA >60 10/07/2020 1351   GFRNONAA 47 (L) 04/28/2014 1058   GFRNONAA 51 (L) 12/23/2013 1045   GFRAA 56 (L) 05/25/2020 0854   GFRAA 57 (L) 04/28/2014 1058   GFRAA 59 (L) 12/23/2013 1045     VAS [place] ABI WITH/WO TBI  Result Date: 01/14/2021  LOWER EXTREMITY DOPPLER STUDY Patient Name:  GABBI [name]  Date of Exam:   01/12/2021 Medical Rec #: [id number]        Accession #:    [id number] Date of Birth: [birth year]        Patient Gender: F Patient Age:   37 years Exam Location:  [facility] Procedure:      VAS [place] ABI WITH/WO TBI Referring Phys: Corene Cornea DEW --------------------------------------------------------------------------------  Indications: Claudication, and rest pain.  Performing Technologist: Charlane Ferretti RT (R)(VS)  Examination Guidelines: A complete evaluation includes at minimum, Doppler waveform signals and systolic blood pressure reading at the level of bilateral brachial, anterior tibial, and posterior tibial arteries, when vessel segments are accessible. Bilateral testing is considered an integral part of a complete examination. Photoelectric Plethysmograph (PPG) waveforms and toe systolic pressure readings are included as required and additional duplex testing as needed. Limited examinations for reoccurring indications may be performed as noted.  ABI Findings: +---------+------------------+-----+---------+--------+ Right    Rt Pressure (mmHg)IndexWaveform Comment  +---------+------------------+-----+---------+--------+ Brachial 141                                      +---------+------------------+-----+---------+--------+ ATA      155                     triphasic         +---------+------------------+-----+---------+--------+ PTA      168               1.19 triphasic         +---------+------------------+-----+---------+--------+ Galvin Proffer               0.87 Normal            +---------+------------------+-----+---------+--------+ +---------+------------------+-----+---------+-------+ Left     Lt Pressure (mmHg)IndexWaveform Comment +---------+------------------+-----+---------+-------+ Brachial 141                                     +---------+------------------+-----+---------+-------+ ATA      169                    triphasic        +---------+------------------+-----+---------+-------+ PTA      139               0.99 triphasic        +---------+------------------+-----+---------+-------+ Great Toe151               1.07 Normal           +---------+------------------+-----+---------+-------+  Summary: Right: Resting right ankle-brachial index is within normal range. No evidence of significant right lower extremity arterial disease. The right toe-brachial index is normal. Left: Resting left  ankle-brachial index is within normal range. No evidence of significant left lower extremity arterial disease. The left toe-brachial index is normal. *See table(s) above for measurements and observations.  Electronically signed by Leotis Pain MD on 01/14/2021 at 9:05:31 AM.    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  She denies any previous knowledge or history of this diagnosis.  We discussed the pathophysiology and natural history of pelvic congestion syndrome.  The risks, benefits and alternatives were discussed regarding the pelvic venogram.  The patient agrees to proceed.  We will have the patient follow-up following her procedure.    2. Aortic atherosclerosis (HCC) We  discussed with patient that the weakness could be related to her previously existing back pain issues.  Prior to this the patient was noted to have balance issues.  The patient will be undergoing physical therapy to see if this helps with her symptoms at all.  3. Essential hypertension Continue antihypertensive medications as already ordered, these medications have been reviewed and there are no changes at this time.   4. Type 2 diabetes mellitus without complication, with long-term current use of insulin (HCC) Continue hypoglycemic medications as already ordered, these medications have been reviewed and there are no changes at this time.  Hgb A1C to be monitored as already arranged by primary service    Current Outpatient Medications on File Prior to Visit  Medication Sig Dispense Refill   albuterol (VENTOLIN HFA) 108 (90 Base) MCG/ACT inhaler Inhale 2 puffs into the lungs every 6 (six) hours as needed for wheezing or shortness of breath. 8 g 0   amLODipine (NORVASC) 5 MG tablet TAKE 2 TABLETS DAILY. 180 tablet 1   Blood Glucose Monitoring Suppl (TRUE METRIX METER) w/Device KIT USE AS DIRECTED 1 kit 0   cetirizine (ZYRTEC) 10 MG chewable tablet Chew 1 tablet (10 mg total) by mouth daily. 90 tablet 1   chlorpheniramine-HYDROcodone (TUSSIONEX PENNKINETIC ER) 10-8 MG/5ML SUER Take 5 mLs by mouth at bedtime as needed for cough. 140 mL 0   citalopram (CELEXA) 20 MG tablet TAKE 2 TABLETS DAILY FOR DEPRESSION. 180 tablet 1   Dapagliflozin Propanediol (FARXIGA PO) Take by mouth.     fenofibrate 54 MG tablet Take 1 tablet (54 mg total) by mouth daily. 90 tablet 1   fluticasone (FLONASE) 50 MCG/ACT nasal spray Place 2 sprays into both nostrils daily. 48 g 1   HYDROcodone-acetaminophen (NORCO/VICODIN) 5-325 MG tablet Take by mouth.     ibuprofen (ADVIL) 800 MG tablet Take 1 tablet (800 mg total) by mouth every 8 (eight) hours as needed. 90 tablet 1   insulin aspart protamine - aspart (NOVOLOG MIX 70/30  FLEXPEN) (70-30) 100 UNIT/ML FlexPen Inject 0.35 mLs (35 Units total) into the skin 2 (two) times daily. Inject 35 units [place] QAM. Gradually increase to 40units [place] QPM 15 mL 11   lovastatin (MEVACOR) 20 MG tablet TAKE 1 TABLET AT BEDTIME FOR HIGH CHOLESTEROL 90 tablet 1   Multiple Vitamin (MULTIVITAMIN) tablet Take by mouth daily. TAKES 1/2 TABLET     pantoprazole (PROTONIX) 20 MG tablet Take 2 tablets (40 mg total) by mouth daily. 90 tablet 1   sharps container 1 each by Does not apply route as needed. To use for insulin syringes and needles. Taking insulin at least twice daily.  E11.65  1 each 5   spironolactone (ALDACTONE) 100 MG tablet Take 100 mg by mouth daily. 2 tablets once a day     TRUE METRIX BLOOD GLUCOSE TEST test strip TEST BLOOD SUGAR THREE TIMES DAILY AFTER MEALS 200 strip 3   TRUEplus Lancets 33G MISC USE AS DIRECTED TO TEST BLOOD SUGAR THREE TIMES DAILY 300 each 3   No current facility-administered medications on file prior to visit.    There are no Patient Instructions on file for this visit. No follow-ups on file.   Kris Hartmann, NP

## 2021-01-23 NOTE — Progress Notes (Signed)
Subjective:    Patient ID: Tiffany Hawkins, female    DOB: 09/19/1945, 75 y.o.   MRN: [id number] Chief Complaint  Patient presents with   Follow-up    Ultrasound follow up    Tiffany Hawkins is a 75 y.o. female.  She returns today for follow-up studies after her CT scan had evidence of aortoiliac calcification.  Because the CT scan was noncontrast it was difficult to discern the degree of stenosis.  It also did not allow for any evaluation of her infrarenal circulation.  The patient notes that she does have leg pain with activity.  She can only walk because her legs to tire and give out.  The patient also notes that she has some balance issues as well and was recommended to begin working with physical therapy.  She denies any rest pain or open wounds or ulcerations.  Her CT scan also previously demonstrated large pelvic varicosities with a very large gonadal vein coming off of the left renal artery that is consistent with possible pelvic congestion syndrome.    Today noninvasive studies show an ABI 1.19 on the right and 1.20 on the left.  She has strong triphasic tibial artery waveforms bilaterally with good toe waveforms bilaterally.     Review of Systems  All other systems reviewed and are negative.     Objective:   Physical Exam Vitals reviewed.  HENT:     Head: Normocephalic.  Cardiovascular:     Rate and Rhythm: Normal rate.     Pulses: Normal pulses.  Pulmonary:     Effort: Pulmonary effort is normal.  Skin:    General: Skin is warm and dry.     Capillary Refill: Capillary refill takes 2 to 3 seconds.  Neurological:     Mental Status: She is alert and oriented to person, place, and time.  Psychiatric:        Mood and Affect: Mood normal.        Behavior: Behavior normal.        Thought Content: Thought content normal.        Judgment: Judgment normal.    BP (!) 143/83 (BP Location: Right Arm)   Pulse 78   Resp 16   Wt 158 lb (71.7 kg)   BMI 28.90 kg/m   Past  Medical History:  Diagnosis Date   Anxiety    Colon cancer ([place]) 10/12/2014   Stage IIB; had chemo   Diabetes mellitus without complication (HCC)    Hyperlipidemia    Hypertension    Lateral epicondylitis    Personal history of chemotherapy    Polio    childhood   Skin cancer    Sqamous Cell, Lt Calf   Sleep apnea    no CPAP, sx improved with wt loss    Social History   Socioeconomic History   Marital status: Married    Spouse name: Not on file   Number of children: Not on file   Years of education: Not on file   Highest education level: Not on file  Occupational History   Not on file  Tobacco Use   Smoking status: Former    Packs/day: 1.50    Years: 20.00    Pack years: 30.00    Types: Cigarettes    Quit date: 04/17/1981    Years since quitting: 39.7   Smokeless tobacco: Never  Vaping Use   Vaping Use: Never used  Substance and Sexual Activity   Alcohol use: No  Drug use: No   Sexual activity: Yes  Other Topics Concern   Not on file  Social History Narrative   Not on file   Social Determinants of Health   Financial Resource Strain: Not on file  Food Insecurity: Not on file  Transportation Needs: Not on file  Physical Activity: Not on file  Stress: Not on file  Social Connections: Not on file  Intimate Partner Violence: Not on file    Past Surgical History:  Procedure Laterality Date   APPENDECTOMY     BACK SURGERY     CATARACT EXTRACTION W/PHACO Left 12/03/2017   Procedure: CATARACT EXTRACTION PHACO AND INTRAOCULAR LENS PLACEMENT (IOC) LEFT  DIABETIC;  Surgeon: King, Bradley Mark, MD;  Location: [facility];  Service: Ophthalmology;  Laterality: Left;  Diabetic - insulin   CATARACT EXTRACTION W/PHACO Right 01/14/2018   Procedure: CATARACT EXTRACTION PHACO AND INTRAOCULAR LENS PLACEMENT (IOC) RIGHT DIABETIC;  Surgeon: King, Bradley Mark, MD;  Location: [facility];  Service: Ophthalmology;  Laterality: Right;  Diabetic - insulin    CERVICAL DISC SURGERY     CHOLECYSTECTOMY     COLON SURGERY     COLONOSCOPY     COLONOSCOPY WITH PROPOFOL N/A 05/17/2015   Procedure: COLONOSCOPY WITH PROPOFOL;  Surgeon: Robert T Elliott, MD;  Location: [facility];  Service: Endoscopy;  Laterality: N/A;   COLONOSCOPY WITH PROPOFOL N/A 04/01/2018   Procedure: COLONOSCOPY WITH BIOPSY;  Surgeon: Wohl, Darren, MD;  Location: [facility];  Service: Endoscopy;  Laterality: N/A;  Diabetic - insulin   ESOPHAGOGASTRODUODENOSCOPY     HERNIA REPAIR     POLYPECTOMY N/A 04/01/2018   Procedure: POLYPECTOMY;  Surgeon: Wohl, Darren, MD;  Location: [facility];  Service: Endoscopy;  Laterality: N/A;   PORT A CATH INJECTION ([facility] HX)     Port has been removed   TUBAL LIGATION      Family History  Problem Relation Age of Onset   Alzheimer's disease Mother    Heart disease Father    Colon cancer Cousin    Colon cancer Cousin    Cancer Cousin        Brain Tumor   Breast cancer Neg Hx     Allergies  Allergen Reactions   Lisinopril Cough    CBC Latest Ref Rng & Units 10/07/2020 05/25/2020 09/22/2019  WBC 4.0 - 10.5 K/uL 7.3 7.4 8.5  Hemoglobin 12.0 - 15.0 g/dL 14.5 15.7 13.8  Hematocrit 36.0 - 46.0 % 44.9 47.3(H) 40.8  Platelets 150 - 400 K/uL 195 220 193      CMP     Component Value Date/Time   NA 140 10/07/2020 1351   NA 145 (H) 05/25/2020 0854   NA 137 12/23/2013 1045   K 4.1 10/07/2020 1351   K 4.3 02/03/2014 1153   CL 107 10/07/2020 1351   CL 102 12/23/2013 1045   CO2 27 10/07/2020 1351   CO2 27 12/23/2013 1045   GLUCOSE 141 (H) 10/07/2020 1351   GLUCOSE 169 (H) 12/23/2013 1045   BUN 23 10/07/2020 1351   BUN 17 05/25/2020 0854   BUN 31 (H) 12/23/2013 1045   CREATININE 0.96 10/07/2020 1351   CREATININE 1.20 04/28/2014 1058   CALCIUM 9.5 10/07/2020 1351   CALCIUM 9.7 12/23/2013 1045   PROT 7.6 10/07/2020 1351   PROT 7.3 05/25/2020 0854   PROT 7.2 07/21/2014 1127   ALBUMIN 4.3 10/07/2020 1351   ALBUMIN 4.7  05/25/2020 0854   ALBUMIN 4.3 07/21/2014 1127     AST 21 10/07/2020 1351   AST 31 07/21/2014 1127   ALT 27 10/07/2020 1351   ALT 43 07/21/2014 1127   ALKPHOS 41 10/07/2020 1351   ALKPHOS 37 (L) 07/21/2014 1127   BILITOT 0.3 10/07/2020 1351   BILITOT 0.2 05/25/2020 0854   BILITOT 0.2 (L) 07/21/2014 1127   GFRNONAA >60 10/07/2020 1351   GFRNONAA 47 (L) 04/28/2014 1058   GFRNONAA 51 (L) 12/23/2013 1045   GFRAA 56 (L) 05/25/2020 0854   GFRAA 57 (L) 04/28/2014 1058   GFRAA 59 (L) 12/23/2013 1045     VAS US ABI WITH/WO TBI  Result Date: 01/14/2021  LOWER EXTREMITY DOPPLER STUDY Patient Name:  Jewels J Zehner  Date of Exam:   01/12/2021 Medical Rec #: [id number]        Accession #:    [id number] Date of Birth: 11/01/1945        Patient Gender: F Patient Age:   75 years Exam Location:  [facility] Procedure:      VAS US ABI WITH/WO TBI Referring Phys: JASON DEW --------------------------------------------------------------------------------  Indications: Claudication, and rest pain.  Performing Technologist: Valarie Baldwin RT (R)(VS)  Examination Guidelines: A complete evaluation includes at minimum, Doppler waveform signals and systolic blood pressure reading at the level of bilateral brachial, anterior tibial, and posterior tibial arteries, when vessel segments are accessible. Bilateral testing is considered an integral part of a complete examination. Photoelectric Plethysmograph (PPG) waveforms and toe systolic pressure readings are included as required and additional duplex testing as needed. Limited examinations for reoccurring indications may be performed as noted.  ABI Findings: +---------+------------------+-----+---------+--------+ Right    Rt Pressure (mmHg)IndexWaveform Comment  +---------+------------------+-----+---------+--------+ Brachial 141                                      +---------+------------------+-----+---------+--------+ ATA      155                     triphasic         +---------+------------------+-----+---------+--------+ PTA      168               1.19 triphasic         +---------+------------------+-----+---------+--------+ Great Toe123               0.87 Normal            +---------+------------------+-----+---------+--------+ +---------+------------------+-----+---------+-------+ Left     Lt Pressure (mmHg)IndexWaveform Comment +---------+------------------+-----+---------+-------+ Brachial 141                                     +---------+------------------+-----+---------+-------+ ATA      169                    triphasic        +---------+------------------+-----+---------+-------+ PTA      139               0.99 triphasic        +---------+------------------+-----+---------+-------+ Great Toe151               1.07 Normal           +---------+------------------+-----+---------+-------+  Summary: Right: Resting right ankle-brachial index is within normal range. No evidence of significant right lower extremity arterial disease. The right toe-brachial index is normal. Left: Resting left   ankle-brachial index is within normal range. No evidence of significant left lower extremity arterial disease. The left toe-brachial index is normal. *See table(s) above for measurements and observations.  Electronically signed by Leotis Pain MD on 01/14/2021 at 9:05:31 AM.    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  She denies any previous knowledge or history of this diagnosis.  We discussed the pathophysiology and natural history of pelvic congestion syndrome.  The risks, benefits and alternatives were discussed regarding the pelvic venogram.  The patient agrees to proceed.  We will have the patient follow-up following her procedure.    2. Aortic atherosclerosis (HCC) We  discussed with patient that the weakness could be related to her previously existing back pain issues.  Prior to this the patient was noted to have balance issues.  The patient will be undergoing physical therapy to see if this helps with her symptoms at all.  3. Essential hypertension Continue antihypertensive medications as already ordered, these medications have been reviewed and there are no changes at this time.   4. Type 2 diabetes mellitus without complication, with long-term current use of insulin (HCC) Continue hypoglycemic medications as already ordered, these medications have been reviewed and there are no changes at this time.  Hgb A1C to be monitored as already arranged by primary service    Current Outpatient Medications on File Prior to Visit  Medication Sig Dispense Refill   albuterol (VENTOLIN HFA) 108 (90 Base) MCG/ACT inhaler Inhale 2 puffs into the lungs every 6 (six) hours as needed for wheezing or shortness of breath. 8 g 0   amLODipine (NORVASC) 5 MG tablet TAKE 2 TABLETS DAILY. 180 tablet 1   Blood Glucose Monitoring Suppl (TRUE METRIX METER) w/Device KIT USE AS DIRECTED 1 kit 0   cetirizine (ZYRTEC) 10 MG chewable tablet Chew 1 tablet (10 mg total) by mouth daily. 90 tablet 1   chlorpheniramine-HYDROcodone (TUSSIONEX PENNKINETIC ER) 10-8 MG/5ML SUER Take 5 mLs by mouth at bedtime as needed for cough. 140 mL 0   citalopram (CELEXA) 20 MG tablet TAKE 2 TABLETS DAILY FOR DEPRESSION. 180 tablet 1   Dapagliflozin Propanediol (FARXIGA PO) Take by mouth.     fenofibrate 54 MG tablet Take 1 tablet (54 mg total) by mouth daily. 90 tablet 1   fluticasone (FLONASE) 50 MCG/ACT nasal spray Place 2 sprays into both nostrils daily. 48 g 1   HYDROcodone-acetaminophen (NORCO/VICODIN) 5-325 MG tablet Take by mouth.     ibuprofen (ADVIL) 800 MG tablet Take 1 tablet (800 mg total) by mouth every 8 (eight) hours as needed. 90 tablet 1   insulin aspart protamine - aspart (NOVOLOG MIX 70/30  FLEXPEN) (70-30) 100 UNIT/ML FlexPen Inject 0.35 mLs (35 Units total) into the skin 2 (two) times daily. Inject 35 units [place] QAM. Gradually increase to 40units [place] QPM 15 mL 11   lovastatin (MEVACOR) 20 MG tablet TAKE 1 TABLET AT BEDTIME FOR HIGH CHOLESTEROL 90 tablet 1   Multiple Vitamin (MULTIVITAMIN) tablet Take by mouth daily. TAKES 1/2 TABLET     pantoprazole (PROTONIX) 20 MG tablet Take 2 tablets (40 mg total) by mouth daily. 90 tablet 1   sharps container 1 each by Does not apply route as needed. To use for insulin syringes and needles. Taking insulin at least twice daily.  E11.65  1 each 5   spironolactone (ALDACTONE) 100 MG tablet Take 100 mg by mouth daily. 2 tablets once a day     TRUE METRIX BLOOD GLUCOSE TEST test strip TEST BLOOD SUGAR THREE TIMES DAILY AFTER MEALS 200 strip 3   TRUEplus Lancets 33G MISC USE AS DIRECTED TO TEST BLOOD SUGAR THREE TIMES DAILY 300 each 3   No current facility-administered medications on file prior to visit.    There are no Patient Instructions on file for this visit. No follow-ups on file.   Kris Hartmann, NP

## 2021-01-24 ENCOUNTER — Encounter
Admission: RE | Disposition: A | Discharge status: Home / Self Care | Payer: Self-pay | Source: Home / Self Care | Attending: Vascular Surgery

## 2021-01-24 ENCOUNTER — Encounter: Payer: Self-pay | Admitting: Vascular Surgery

## 2021-01-24 ENCOUNTER — Ambulatory Visit (INDEPENDENT_AMBULATORY_CARE_PROVIDER_SITE_OTHER): Payer: Medicare HMO | Admitting: Nurse Practitioner

## 2021-01-24 ENCOUNTER — Ambulatory Visit
Admission: RE | Admit: 2021-01-24 | Discharge: 2021-01-24 | Disposition: A | Discharge status: Home / Self Care | Payer: Medicare HMO | Attending: Vascular Surgery | Admitting: Vascular Surgery

## 2021-01-24 ENCOUNTER — Other Ambulatory Visit: Payer: Self-pay

## 2021-01-24 ENCOUNTER — Encounter (INDEPENDENT_AMBULATORY_CARE_PROVIDER_SITE_OTHER): Payer: Medicare HMO

## 2021-01-24 ENCOUNTER — Encounter: Payer: Medicare HMO | Admitting: Physical Therapy

## 2021-01-24 DIAGNOSIS — I10 Essential (primary) hypertension: Secondary | ICD-10-CM | POA: Insufficient documentation

## 2021-01-24 DIAGNOSIS — Z794 Long term (current) use of insulin: Secondary | ICD-10-CM | POA: Diagnosis not present

## 2021-01-24 DIAGNOSIS — E119 Type 2 diabetes mellitus without complications: Secondary | ICD-10-CM | POA: Diagnosis not present

## 2021-01-24 DIAGNOSIS — Z87891 Personal history of nicotine dependence: Secondary | ICD-10-CM | POA: Insufficient documentation

## 2021-01-24 DIAGNOSIS — Z79899 Other long term (current) drug therapy: Secondary | ICD-10-CM | POA: Diagnosis not present

## 2021-01-24 DIAGNOSIS — N9489 Other specified conditions associated with female genital organs and menstrual cycle: Secondary | ICD-10-CM | POA: Insufficient documentation

## 2021-01-24 DIAGNOSIS — Z7984 Long term (current) use of oral hypoglycemic drugs: Secondary | ICD-10-CM | POA: Diagnosis not present

## 2021-01-24 DIAGNOSIS — I700 Atherosclerosis of aorta: Secondary | ICD-10-CM | POA: Insufficient documentation

## 2021-01-24 DIAGNOSIS — Z888 Allergy status to other drugs, medicaments and biological substances status: Secondary | ICD-10-CM | POA: Diagnosis not present

## 2021-01-24 HISTORY — PX: LOWER EXTREMITY VENOGRAPHY: CATH118253

## 2021-01-24 HISTORY — PX: EMBOLIZATION (CATH LAB): CATH118239

## 2021-01-24 HISTORY — PX: EMBOLIZATION: CATH118239

## 2021-01-24 LAB — BUN: BUN: 40 mg/dL — ABNORMAL HIGH (ref 8–23)

## 2021-01-24 LAB — CREATININE, SERUM
Creatinine, Ser: 1.32 mg/dL — ABNORMAL HIGH (ref 0.44–1.00)
GFR, Estimated: 42 mL/min — ABNORMAL LOW (ref >60)

## 2021-01-24 LAB — GLUCOSE, CAPILLARY: Glucose-Capillary: 162 mg/dL — ABNORMAL HIGH (ref 70–99)

## 2021-01-24 SURGERY — LOWER EXTREMITY VENOGRAPHY
Anesthesia: Moderate Sedation

## 2021-01-24 MED ORDER — CEFAZOLIN SODIUM-DEXTROSE 2-4 GM/100ML-% IV SOLN
INTRAVENOUS | Status: AC
  Filled 2021-01-24: qty 100

## 2021-01-24 MED ORDER — MIDAZOLAM HCL 2 MG/2ML IJ SOLN
INTRAMUSCULAR | Status: DC | PRN
  Administered 2021-01-24: 2 mg via INTRAVENOUS

## 2021-01-24 MED ORDER — CEFAZOLIN SODIUM-DEXTROSE 1-4 GM/50ML-% IV SOLN
INTRAVENOUS | Status: DC | PRN
  Administered 2021-01-24: 2 g via INTRAVENOUS

## 2021-01-24 MED ORDER — DIPHENHYDRAMINE HCL 50 MG/ML IJ SOLN: 50.0000 mg | Freq: Once | INTRAMUSCULAR | Status: DC | PRN

## 2021-01-24 MED ORDER — HEPARIN SODIUM (PORCINE) 1000 UNIT/ML IJ SOLN
INTRAMUSCULAR | Status: DC | PRN
  Administered 2021-01-24: 3000 [IU] via INTRAVENOUS

## 2021-01-24 MED ORDER — MIDAZOLAM HCL 2 MG/ML PO SYRP: 8.0000 mg | ORAL_SOLUTION | Freq: Once | ORAL | Status: DC | PRN

## 2021-01-24 MED ORDER — SODIUM CHLORIDE 0.9 % IV SOLN: INTRAVENOUS | Status: DC

## 2021-01-24 MED ORDER — FENTANYL CITRATE (PF) 100 MCG/2ML IJ SOLN
INTRAMUSCULAR | Status: AC
  Filled 2021-01-24: qty 2

## 2021-01-24 MED ORDER — ONDANSETRON HCL 4 MG/2ML IJ SOLN: 4.0000 mg | Freq: Four times a day (QID) | INTRAMUSCULAR | Status: DC | PRN

## 2021-01-24 MED ORDER — HEPARIN SODIUM (PORCINE) 1000 UNIT/ML IJ SOLN
INTRAMUSCULAR | Status: AC
  Filled 2021-01-24: qty 1

## 2021-01-24 MED ORDER — HYDROMORPHONE HCL 1 MG/ML IJ SOLN: 1.0000 mg | Freq: Once | INTRAMUSCULAR | Status: DC | PRN

## 2021-01-24 MED ORDER — FAMOTIDINE 20 MG PO TABS: 40.0000 mg | ORAL_TABLET | Freq: Once | ORAL | Status: DC | PRN

## 2021-01-24 MED ORDER — FENTANYL CITRATE (PF) 100 MCG/2ML IJ SOLN
INTRAMUSCULAR | Status: DC | PRN
  Administered 2021-01-24: 50 ug via INTRAVENOUS

## 2021-01-24 MED ORDER — METHYLPREDNISOLONE SODIUM SUCC 125 MG IJ SOLR: 125.0000 mg | Freq: Once | INTRAMUSCULAR | Status: DC | PRN

## 2021-01-24 MED ORDER — IODIXANOL 320 MG/ML IV SOLN
INTRAVENOUS | Status: DC | PRN
  Administered 2021-01-24: 25 mL via INTRAVENOUS

## 2021-01-24 MED ORDER — MIDAZOLAM HCL 2 MG/2ML IJ SOLN
INTRAMUSCULAR | Status: AC
  Filled 2021-01-24: qty 2

## 2021-01-24 MED ORDER — CEFAZOLIN SODIUM-DEXTROSE 2-4 GM/100ML-% IV SOLN: 2.0000 g | Freq: Once | INTRAVENOUS | Status: DC

## 2021-01-24 SURGICAL SUPPLY — 18 items
CATH BEACON 5 .035 100 C2 TIP (CATHETERS) ×1 IMPLANT
CATH COBRA TEMPO C2 5X65 (CATHETERS) ×1 IMPLANT
CATH MICROCATH PRGRT 2.8F 110 (CATHETERS) IMPLANT
COIL 400 COMPLEX STD 16X60CM (Vascular Products) ×2 IMPLANT
COVER PROBE U/S 5X48 (MISCELLANEOUS) ×1 IMPLANT
DEVICE OCCLUSION POD14 (Embolic) IMPLANT
DEVICE OCCLUSION PODJ45 (Vascular Products) IMPLANT
DEVICE OCCLUSION PODJ60 (Vascular Products) IMPLANT
DEVICE TORQUE .025-.038 (MISCELLANEOUS) ×1 IMPLANT
GLIDEWIRE STIFF .35X180X3 HYDR (WIRE) ×1 IMPLANT
HANDLE DETACHMENT COIL (MISCELLANEOUS) ×1 IMPLANT
MICROCATH PROGREAT 2.8F 110 CM (CATHETERS) ×3
OCCLUSION DEVICE POD14 (Embolic) ×3 IMPLANT
OCCLUSION DEVICE PODJ45 (Vascular Products) ×3 IMPLANT
OCCLUSION DEVICE PODJ60 (Vascular Products) ×9 IMPLANT
PACK ANGIOGRAPHY (CUSTOM PROCEDURE TRAY) ×3 IMPLANT
SHEATH BRITE TIP 5FRX11 (SHEATH) ×1 IMPLANT
WIRE GUIDERIGHT .035X150 (WIRE) ×1 IMPLANT

## 2021-01-24 NOTE — Op Note (Signed)
[facility]   OPERATIVE NOTE  DATE: 01/24/2021  PRE-OPERATIVE DIAGNOSIS: Pelvic congestion syndrome  POST-OPERATIVE DIAGNOSIS: same as above  PROCEDURE: 1.   Ultrasound guidance for vascular access right femoral vein 2.   Catheter placement into the left renal vein and into the left gonadal vein from right femoral vein approach 3.   Inferior venacavogram selective venogram of the left renal vein and the left gonadal vein 4.   Coil embolization of the left gonadal vein with a total of 7 Ruby coils   SURGEON: Leotis Pain  ASSISTANT(S): None  ANESTHESIA: Local with moderate conscious sedation for approximately 24 minutes using a total of 2 mg of Versed and 50 mcg of Fentanyl  ESTIMATED BLOOD LOSS: 5 cc  FINDING(S): 1.  Extremely large left gonadal vein feeding large pelvic varicosities successfully treated with embolization   INDICATIONS:   MONNIE GUDGEL is a 75 y.o. female who presents with symptoms of abdominal pain and findings concerning for pelvic congestion syndrome.  The patient is brought in for venography and further evaluation to determine what treatment would be appropriate, and possible concomitant treatment if appropriate.  Risks and benefits were discussed and informed consent was obtained.  DESCRIPTION: After obtaining full informed written consent, the patient was brought back to the operating room and placed supine upon the operating table.  The patient was prepped and draped in the standard fashion.  The right femoral vein was visualized with ultrasound and found to be widely patent.  Was accessed under direct ultrasound guidance without difficulty with a Seldinger needle.  A J-wire and 5 French sheath were then placed.  A pigtail catheter was placed into the inferior vena cava and inferior venacavogram was performed showing the vena cava to be widely patent and showing the origins of the renal veins.  I then selectively cannulated the left  renal vein with a C2 catheter and a Glide wire.  Selective imaging of the left renal vein then demonstrated the large gonadal vein coming off inferiorly in the mid to distal left renal vein.  The left renal vein itself may have had some mild narrowing in the midportion but this did not appear severe.  At this point, using the Glidewire and the C2 catheter and then a Progreat microcatheter I was able to cannulate the left gonadal vein and perform selective imaging which demonstrated very large varicosities of the left gonadal vein going all the way down into the pelvis.  This was consistent with pelvic congestion syndrome.  At this point, I elected to perform embolization of the left gonadal vein to treat pelvic congestion syndrome.  I was able advance the prograde microcatheter well down into these collaterals almost all the way to the pelvis.  I then began performing embolization after selective imaging showed [place] to be in excellent location.  A total of 7 Ruby coils were then used and a series of coils were placed.  The first coil was a pod 14, 60 cm long, the next 2 coils were 16 mm in diameter by 60 cm length standard coil.  I then used a total of 4 packing coils with the first 3 being 60 cm and the last one being 45 cm.  Once embolization appeared to be successful, selective imaging left renal vein showed minimal reflux of blood down into the left gonadal vein at this point. I elected to terminate the procedure.  The diagnostic catheter was removed.  The sheath was then removed from  the groin and pressure was held with a sterile dressing placed. At this point, we elected to complete the procedure.  The patient was taken to the recovery room in stable condition.   COMPLICATIONS: None  CONDITION: Stable  Leotis Pain  01/24/2021, 1:39 PM    This note was created with Dragon Medical transcription system. Any errors in dictation are purely unintentional.

## 2021-01-24 NOTE — Interval H&P Note (Signed)
History and Physical Interval Note:  01/24/2021 12:51 PM  Tiffany Hawkins  has presented today for surgery, with the diagnosis of L Venogram and pelvic embolization     Pelvic congestive syndrome.  The various methods of treatment have been discussed with the patient and family. After consideration of risks, benefits and other options for treatment, the patient has consented to  Procedure(s): LOWER EXTREMITY VENOGRAPHY (Left) EMBOLIZATION (N/A) as a surgical intervention.  The patient's history has been reviewed, patient examined, no change in status, stable for surgery.  I have reviewed the patient's chart and labs.  Questions were answered to the patient's satisfaction.     Leotis Pain

## 2021-01-25 ENCOUNTER — Encounter: Payer: Medicare HMO | Admitting: Physical Therapy

## 2021-01-27 ENCOUNTER — Other Ambulatory Visit: Payer: Self-pay

## 2021-01-27 ENCOUNTER — Ambulatory Visit: Payer: Medicare HMO | Admitting: Physical Therapy

## 2021-01-27 ENCOUNTER — Encounter: Payer: Medicare HMO | Admitting: Physical Therapy

## 2021-01-27 DIAGNOSIS — S322XXS Fracture of coccyx, sequela: Secondary | ICD-10-CM

## 2021-01-27 DIAGNOSIS — M545 Low back pain, unspecified: Secondary | ICD-10-CM

## 2021-01-27 DIAGNOSIS — M89662 Osteopathy after poliomyelitis, left lower leg: Secondary | ICD-10-CM

## 2021-01-27 DIAGNOSIS — R29898 Other symptoms and signs involving the musculoskeletal system: Secondary | ICD-10-CM | POA: Diagnosis not present

## 2021-01-27 DIAGNOSIS — Z9181 History of falling: Secondary | ICD-10-CM

## 2021-01-27 DIAGNOSIS — B91 Sequelae of poliomyelitis: Secondary | ICD-10-CM

## 2021-01-27 DIAGNOSIS — S3210XS Unspecified fracture of sacrum, sequela: Secondary | ICD-10-CM

## 2021-01-31 ENCOUNTER — Other Ambulatory Visit: Payer: Self-pay

## 2021-01-31 ENCOUNTER — Ambulatory Visit: Payer: Medicare HMO | Admitting: Physical Therapy

## 2021-01-31 ENCOUNTER — Encounter: Payer: Self-pay | Admitting: Physical Therapy

## 2021-01-31 DIAGNOSIS — B91 Sequelae of poliomyelitis: Secondary | ICD-10-CM | POA: Diagnosis not present

## 2021-01-31 DIAGNOSIS — M545 Low back pain, unspecified: Secondary | ICD-10-CM

## 2021-01-31 DIAGNOSIS — S322XXS Fracture of coccyx, sequela: Secondary | ICD-10-CM | POA: Diagnosis not present

## 2021-01-31 DIAGNOSIS — R29898 Other symptoms and signs involving the musculoskeletal system: Secondary | ICD-10-CM

## 2021-01-31 DIAGNOSIS — Z9181 History of falling: Secondary | ICD-10-CM | POA: Diagnosis not present

## 2021-01-31 DIAGNOSIS — S3210XS Unspecified fracture of sacrum, sequela: Secondary | ICD-10-CM

## 2021-01-31 DIAGNOSIS — M89662 Osteopathy after poliomyelitis, left lower leg: Secondary | ICD-10-CM | POA: Diagnosis not present

## 2021-01-31 NOTE — Therapy (Signed)
[facility] [facility] [address] [place], [place], [address] Phone: [phone]   Fax:  [phone]  Physical Therapy Treatment  Patient Details  Name: Tiffany Hawkins MRN: [id number] Date of Birth: 09-Sep-1945 Referring Provider (PT): Girtha Hake MD   Encounter Date: 01/27/2021   PT End of Session - 01/31/21 1028     Visit Number 4    Number of Visits 17    Date for PT Re-Evaluation 03/10/21    Authorization - Visit Number 4    Authorization - Number of Visits 10    Progress Note Due on Visit 10    PT Start Time 1111    PT Stop Time 1202    PT Time Calculation (min) 51 min    Equipment Utilized During Treatment Gait belt;Other (comment)   [phone] and FWW   Activity Tolerance Patient tolerated treatment well;Patient limited by fatigue;Patient limited by pain    Behavior During Therapy [facility] for tasks assessed/performed             Past Medical History:  Diagnosis Date   Anxiety    Colon cancer ([place]) 10/12/2014   Stage IIB; had chemo   Diabetes mellitus without complication ([place])    Hyperlipidemia    Hypertension    Lateral epicondylitis    Personal history of chemotherapy    Polio    childhood   Skin cancer    Sqamous Cell, Lt Calf   Sleep apnea    no CPAP, sx improved with wt loss    Past Surgical History:  Procedure Laterality Date   APPENDECTOMY     BACK SURGERY     CATARACT EXTRACTION W/PHACO Left 12/03/2017   Procedure: CATARACT EXTRACTION PHACO AND INTRAOCULAR LENS PLACEMENT ([place]) LEFT  DIABETIC;  Surgeon: Eulogio Bear, MD;  Location: [place];  Service: Ophthalmology;  Laterality: Left;  Diabetic - insulin   CATARACT EXTRACTION W/PHACO Right 01/14/2018   Procedure: CATARACT EXTRACTION PHACO AND INTRAOCULAR LENS PLACEMENT ([facility]) RIGHT DIABETIC;  Surgeon: Eulogio Bear, MD;  Location: [place];  Service: Ophthalmology;  Laterality: Right;  Diabetic - insulin   CERVICAL DISC SURGERY      CHOLECYSTECTOMY     COLON SURGERY     COLONOSCOPY     COLONOSCOPY WITH PROPOFOL N/A 05/17/2015   Procedure: COLONOSCOPY WITH PROPOFOL;  Surgeon: Manya Silvas, MD;  [facility] END[facility]COPY;  Service: Endoscopy;  Laterality: N/A;   COLONOSCOPY WITH PROPOFOL N/A 04/01/2018   Procedure: COLONOSCOPY WITH BIOPSY;  Surgeon: [name], MD;  Location: [place];  Service: Endoscopy;  Laterality: N/A;  Diabetic - insulin   EMBOLIZATION N/A 01/24/2021   Procedure: EMBOLIZATION;  Surgeon: Algernon Huxley, MD;  Location: [place] CV LAB;  Service: Cardiovascular;  Laterality: N/A;   ESOPHAGOGASTRODUODENOSCOPY     HERNIA REPAIR     LOWER EXTREMITY VENOGRAPHY Left 01/24/2021   Procedure: LOWER EXTREMITY VENOGRAPHY;  Surgeon: Algernon Huxley, MD;  Location: [place] CV LAB;  Service: Cardiovascular;  Laterality: Left;   POLYPECTOMY N/A 04/01/2018   Procedure: POLYPECTOMY;  Surgeon: [name], MD;  Location: [place];  Service: Endoscopy;  Laterality: N/A;   PORT A CATH INJECTION ([place] HX)     Port has been removed   TUBAL LIGATION      There were no vitals filed for this visit.   Subjective Assessment - 01/31/21 1012     Subjective Pt. states she is doing better overall  since procedure on Monday (see notes).  Pt. reports being a little tired today but ready for PT.  Pt. entered PT with no assistive device and PT brought RW out to waiting room.    Limitations Walking;House hold activities;Sitting;Lifting;Standing    Diagnostic tests MRI of lumbar and cervical spine. See imaging history.    Patient Stated Goals Reduce pain and improve balance.    Currently in Pain? Yes   no subjective pain score given   Pain Location Groin    Pain Orientation Left               Ther.ex.:   Nustep L1 B UE/LE (warm-up/ discussed procedure on Monday)  Supine LE/ lumbar generalized stretches 7 min.   Supine SLR/ hip abduction/ bolster bridging 20x each.  Pain tolerable.      Neuro.mm.:   Walking with RW and mirror feedback in gym (limited hip/knee flexion).  Poor endurance/ seated rest breaks.     Extra time to stand with RW.  Pt. Cued to be aware of wt. Shifting/ fatigue.    Walking in //-bars with turning CW/CCW.   Cone taps with mirror feedback.    Walking in hallway with 6" hurdles (step overs).  Pt. Requires HHA and unable to complete independently due to fear of falling/ balance issues.   Standing cone taps (cued with L or R and color of cone)- with and without UE assist.         PT Long Term Goals - 01/13/21 1239       PT LONG TERM GOAL #1   Title Pt will improve MMT scores by 1/2 grade to promote functional strength gains for transfers, amb, and decreased caregiver burden.    Baseline 4/4- Hip flexion  4/4-  Hip abduction  4/4-  Hip adduction  4/3+  Knee extension  4/3+ Knee flexion 4/3+ Ankle dorsiflexion  4/3+ Ankle plantarflexion    Time 8    Period Weeks    Status New    Target Date 03/10/21      PT LONG TERM GOAL #2   Title Pt will improve BERG score by 5 pts to promote safety with ambulation and functional tasks.    Baseline BERG: 18/56    Time 8    Period Weeks    Status New    Target Date 03/10/21      PT LONG TERM GOAL #3   Title Pt will improve FOTO score to predicted improvement value of 56 to measure self reported improvement in functional mobility.    Baseline 36    Time 8    Period Weeks    Status New    Target Date 03/10/21      PT LONG TERM GOAL #4   Title Pt will demonstrates safety with ambulation using FWW with no LOB and only occ verbal cueing and SBA with 10 minutes of overground walking.    Baseline Pt amb 1 min with extensive verbal cueing CGA-min A.    Time 8    Period Weeks    Status New    Target Date 03/10/21      PT LONG TERM GOAL #5   Title Pt will decrease worse pain to 7/10 or less to promote greater access to functional mobility.    Baseline Present 7/10, Best 4/10, Worst 10/10:    Time 8     Period Weeks    Status New    Target Date 03/10/21  Plan - 01/31/21 1029     Clinical Impression Statement Pt. challenged with dynamic balance tasks outside of //-bars and in hallway with use of 6" green hurdles.  Pt. requires CGA/min. A with recip. step overs/ step ups without UE assist.  Pt. benefits/ requires use of RW for safety with gait and community mobility.  Moderate B LE muscle fatigue/ weakness noted during standing ther.ex.  Extra time to complete cone taps/ turning with light UE assist required.  No changes to HEP and pt. encouraged to have assist with walking, esp. when fatigued.    Personal Factors and Comorbidities Comorbidity 3+;Age;Transportation;Fitness    Comorbidities Hx cancer, previous back surgery, DM, vertigo, Aortic atherosclerosis ([place])    Examination-Activity Limitations Stairs;Reach Overhead;Stand;Dressing;Sit;Transfers;Lift;Caring for Others;Carry;Locomotion Level;Squat    Examination-Participation Restrictions Interpersonal Relationship;Cleaning;Laundry;Yard Work;Driving;Meal Prep;Shop    Stability/Clinical Decision Making Evolving/Moderate complexity    Clinical Decision Making Moderate    Rehab Potential Fair    PT Frequency 2x / week    PT Duration 8 weeks    PT Treatment/Interventions ADLs/Self Care Home Management;Aquatic Therapy;Neuromuscular re-education;Balance training;Therapeutic exercise;Therapeutic activities;Functional mobility training;Stair training;Gait training;DME Instruction;Patient/family education;Manual techniques;Energy conservation;Biofeedback;Electrical Stimulation;Moist Heat;Traction;Ultrasound;Dry needling;Passive range of motion;Vestibular;Vasopneumatic Device;Spinal Manipulations;Joint Manipulations    PT Next Visit Plan Reassess HEP/ dynamic balance ex.    PT Home Exercise Plan deferred    Consulted and Agree with Plan of Care Patient             Patient will benefit from skilled therapeutic  intervention in order to improve the following deficits and impairments:  Abnormal gait, Decreased endurance, Decreased activity tolerance, Decreased strength, Pain, Difficulty walking, Decreased mobility, Decreased balance, Decreased range of motion, Improper body mechanics, Postural dysfunction, Decreased safety awareness, Decreased coordination, Decreased knowledge of precautions, Decreased knowledge of use of DME, Dizziness, Hypomobility, Impaired flexibility, Impaired sensation, Impaired perceived functional ability  Visit Diagnosis: Bilateral low back pain, unspecified chronicity, unspecified whether sciatica present  Closed fracture of sacrum and coccyx, sequela  Bilateral leg weakness  Polio osteopathy of lower leg, left ([facility])  At high risk for injury related to fall     Problem List Patient Active Problem List   Diagnosis Date Noted   Aortic atherosclerosis ([place]) 12/29/2020   Deformity of toe of left foot 03/28/2020   H/O multiple pulmonary nodules 09/22/2019   Chronic cough 09/22/2019   Perennial allergic rhinitis 09/17/2019   Acute upper respiratory infection 08/28/2019   Weakness 08/28/2019   Acute bronchitis 06/01/2019   Cough productive of yellow sputum 06/01/2019   Vasomotor rhinitis 05/01/2019   Chronic kidney disease, stage II (mild) 03/14/2019   Gastroesophageal reflux disease without esophagitis 03/14/2019   Intractable episodic headache 03/14/2019   Need for prophylactic vaccination with combined diphtheria-tetanus-pertussis (DTaP) vaccine 03/14/2019   Renal cyst 11/27/2018   Abnormality of gait and mobility 11/27/2018   Hepatic steatosis 10/31/2018   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[place]) 10/31/2018   Elevated ferritin 10/31/2018   Vertigo 08/13/2018   Gastroenteritis 05/09/2018   Diarrhea 05/09/2018   Nausea 05/09/2018   Personal history of colon cancer    Polyp of sigmoid colon    Dysuria 02/23/2018   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  02/10/2018   Encounter for general adult medical examination with abnormal findings 02/10/2018   Acute non-recurrent pansinusitis 08/24/2017   Other headache syndrome 08/24/2017   Uncontrolled type 2 diabetes mellitus with hyperglycemia ([place]) 08/24/2017   Type 2 diabetes mellitus without complication, with long-term current use of insulin ([place])  08/22/2017   Need for vaccination against Streptococcus pneumoniae using pneumococcal conjugate vaccine 13 08/22/2017   Essential hypertension 05/03/2017   Otalgia, bilateral 05/02/2017   Type 2 diabetes mellitus with hyperglycemia ([place]) 05/02/2017   Mixed hyperlipidemia 05/02/2017   Right lower quadrant pain 06/29/2016   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   History of colon cancer 04/13/2015   Cancer of ascending colon ([place]) 10/12/2014   Pura Spice, PT, DPT # [id number] 01/31/2021, 10:41 AM  [place] [facility] [address] [place], [place], [address] Phone: [phone]   Fax:  [phone]  Name: Tiffany Hawkins MRN: [id number] Date of Birth: 1946/02/26

## 2021-02-01 ENCOUNTER — Encounter: Payer: Medicare HMO | Admitting: Physical Therapy

## 2021-02-01 ENCOUNTER — Encounter: Payer: Self-pay | Admitting: Physical Therapy

## 2021-02-01 NOTE — Therapy (Signed)
[place] [facility] [address] [place], [place], [address] Phone: [phone]   Fax:  [phone]  Physical Therapy Treatment  Patient Details  Name: Tiffany Hawkins MRN: [id number] Date of Birth: 1945/10/15 Referring Provider (PT): Girtha Hake MD   Encounter Date: 01/31/2021   PT End of Session - 02/01/21 0729     Visit Number 5    Number of Visits 17    Date for PT Re-Evaluation 03/10/21    Authorization - Visit Number 5    Authorization - Number of Visits 10    Progress Note Due on Visit 10    PT Start Time 5027    PT Stop Time 1201    PT Time Calculation (min) 48 min    Equipment Utilized During Treatment Gait belt;Other (comment)   [phone] and FWW   Activity Tolerance Patient tolerated treatment well;Patient limited by fatigue;Patient limited by pain    Behavior During Therapy [facility] for tasks assessed/performed             Past Medical History:  Diagnosis Date   Anxiety    Colon cancer ([place]) 10/12/2014   Stage IIB; had chemo   Diabetes mellitus without complication ([place])    Hyperlipidemia    Hypertension    Lateral epicondylitis    Personal history of chemotherapy    Polio    childhood   Skin cancer    Sqamous Cell, Lt Calf   Sleep apnea    no CPAP, sx improved with wt loss    Past Surgical History:  Procedure Laterality Date   APPENDECTOMY     BACK SURGERY     CATARACT EXTRACTION W/PHACO Left 12/03/2017   Procedure: CATARACT EXTRACTION PHACO AND INTRAOCULAR LENS PLACEMENT ([place]) LEFT  DIABETIC;  Surgeon: Eulogio Bear, MD;  Location: [place];  Service: Ophthalmology;  Laterality: Left;  Diabetic - insulin   CATARACT EXTRACTION W/PHACO Right 01/14/2018   Procedure: CATARACT EXTRACTION PHACO AND INTRAOCULAR LENS PLACEMENT ([facility]) RIGHT DIABETIC;  Surgeon: Eulogio Bear, MD;  Location: [place];  Service: Ophthalmology;  Laterality: Right;  Diabetic - insulin   CERVICAL DISC SURGERY      CHOLECYSTECTOMY     COLON SURGERY     COLONOSCOPY     COLONOSCOPY WITH PROPOFOL N/A 05/17/2015   Procedure: COLONOSCOPY WITH PROPOFOL;  Surgeon: Manya Silvas, MD;  Location: [facility] ENDOSCOPY;  Service: Endoscopy;  Laterality: N/A;   COLONOSCOPY WITH PROPOFOL N/A 04/01/2018   Procedure: COLONOSCOPY WITH BIOPSY;  Surgeon: [name], MD;  Location: [place];  Service: Endoscopy;  Laterality: N/A;  Diabetic - insulin   EMBOLIZATION N/A 01/24/2021   Procedure: EMBOLIZATION;  Surgeon: Algernon Huxley, MD;  Location: [place] CV LAB;  Service: Cardiovascular;  Laterality: N/A;   ESOPHAGOGASTRODUODENOSCOPY     HERNIA REPAIR     LOWER EXTREMITY VENOGRAPHY Left 01/24/2021   Procedure: LOWER EXTREMITY VENOGRAPHY;  Surgeon: Algernon Huxley, MD;  Location: [place] [facility];  Service: Cardiovascular;  Laterality: Left;   POLYPECTOMY N/A 04/01/2018   Procedure: POLYPECTOMY;  Surgeon: [name], MD;  Location: [place];  Service: Endoscopy;  Laterality: N/A;   PORT A CATH INJECTION ([place] HX)     Port has been removed   TUBAL LIGATION      There were no vitals filed for this visit.   Subjective Assessment - 02/01/21 0728     Subjective Pt present to tx with continued reports  of fatigue and poor mobility. Pt states no fall from last tx.    Limitations Walking;House hold activities;Sitting;Lifting;Standing    Diagnostic tests MRI of lumbar and cervical spine. See imaging history.    Patient Stated Goals Reduce pain and improve balance.    Currently in Pain? Yes    Pain Score 0-No pain               Ther.ex.:   Nustep L2 B UE/LE (warm-up).    Standing hip ex./ reviewed seated and supine HEP     Neuro.mm.:   Walking with RW and mirror feedback in gym (limited hip/knee flexion).  Poor endurance/ seated rest breaks.     Extra time to stand with RW.  Pt. Cued to be aware of wt. Shifting/ fatigue.    Walking in //-bars with turning CW/CCW.   Cone taps  with mirror feedback.    Stair training with CGA-min A and cueing to facilitate ascending with stronger leg first and descending with weaker leg first.  3 x 4 steps.          PT Long Term Goals - 01/13/21 1239       PT LONG TERM GOAL #1   Title Pt will improve MMT scores by 1/2 grade to promote functional strength gains for transfers, amb, and decreased caregiver burden.    Baseline 4/4- Hip flexion  4/4-  Hip abduction  4/4-  Hip adduction  4/3+  Knee extension  4/3+ Knee flexion 4/3+ Ankle dorsiflexion  4/3+ Ankle plantarflexion    Time 8    Period Weeks    Status New    Target Date 03/10/21      PT LONG TERM GOAL #2   Title Pt will improve BERG score by 5 pts to promote safety with ambulation and functional tasks.    Baseline BERG: 18/56    Time 8    Period Weeks    Status New    Target Date 03/10/21      PT LONG TERM GOAL #3   Title Pt will improve FOTO score to predicted improvement value of 56 to measure self reported improvement in functional mobility.    Baseline 36    Time 8    Period Weeks    Status New    Target Date 03/10/21      PT LONG TERM GOAL #4   Title Pt will demonstrates safety with ambulation using FWW with no LOB and only occ verbal cueing and SBA with 10 minutes of overground walking.    Baseline Pt amb 1 min with extensive verbal cueing CGA-min A.    Time 8    Period Weeks    Status New    Target Date 03/10/21      PT LONG TERM GOAL #5   Title Pt will decrease worse pain to 7/10 or less to promote greater access to functional mobility.    Baseline Present 7/10, Best 4/10, Worst 10/10:    Time 8    Period Weeks    Status New    Target Date 03/10/21                   Plan - 02/01/21 0730     Clinical Impression Statement Pt tolerated tx well with prolonged rest breaks 2/2 LE fatigue and increased fall risk. Pt required constant CGA with occ min A- mod A to ensure limited fall risk. Pt displays increased fall risk during  rotational movements with increased  dizziness. Pt continues to display mark Bilat  LE endurance, strength, and ROM deficits resulting in decreased safe home/community mobiity, increased pain, and limited access to QOL. Pt. will continue to benefit from skilled physical therapy to progress POC to address remaining deficits to facilitate maximum functional capacity for optimal personal health and wellness for ADLs.    Personal Factors and Comorbidities Comorbidity 3+;Age;Transportation;Fitness    Comorbidities Hx cancer, previous back surgery, DM, vertigo, Aortic atherosclerosis ([place])    Examination-Activity Limitations Stairs;Reach Overhead;Stand;Dressing;Sit;Transfers;Lift;Caring for Others;Carry;Locomotion Level;Squat    Examination-Participation Restrictions Interpersonal Relationship;Cleaning;Laundry;Yard Work;Driving;Meal Prep;Shop    Stability/Clinical Decision Making Evolving/Moderate complexity    Clinical Decision Making Moderate    Rehab Potential Fair    PT Frequency 2x / week    PT Duration 8 weeks    PT Treatment/Interventions ADLs/Self Care Home Management;Aquatic Therapy;Neuromuscular re-education;Balance training;Therapeutic exercise;Therapeutic activities;Functional mobility training;Stair training;Gait training;DME Instruction;Patient/family education;Manual techniques;Energy conservation;Biofeedback;Electrical Stimulation;Moist Heat;Traction;Ultrasound;Dry needling;Passive range of motion;Vestibular;Vasopneumatic Device;Spinal Manipulations;Joint Manipulations    PT Next Visit Plan Reassess HEP/ dynamic balance ex.    PT Home Exercise Plan deferred    Consulted and Agree with Plan of Care Patient             Patient will benefit from skilled therapeutic intervention in order to improve the following deficits and impairments:  Abnormal gait, Decreased endurance, Decreased activity tolerance, Decreased strength, Pain, Difficulty walking, Decreased mobility, Decreased balance,  Decreased range of motion, Improper body mechanics, Postural dysfunction, Decreased safety awareness, Decreased coordination, Decreased knowledge of precautions, Decreased knowledge of use of DME, Dizziness, Hypomobility, Impaired flexibility, Impaired sensation, Impaired perceived functional ability  Visit Diagnosis: Bilateral low back pain, unspecified chronicity, unspecified whether sciatica present  At high risk for injury related to fall  Closed fracture of sacrum and coccyx, sequela  Bilateral leg weakness  Polio osteopathy of lower leg, left ([place])     Problem List Patient Active Problem List   Diagnosis Date Noted   Aortic atherosclerosis ([place]) 12/29/2020   Deformity of toe of left foot 03/28/2020   H/O multiple pulmonary nodules 09/22/2019   Chronic cough 09/22/2019   Perennial allergic rhinitis 09/17/2019   Acute upper respiratory infection 08/28/2019   Weakness 08/28/2019   Acute bronchitis 06/01/2019   Cough productive of yellow sputum 06/01/2019   Vasomotor rhinitis 05/01/2019   Chronic kidney disease, stage II (mild) 03/14/2019   Gastroesophageal reflux disease without esophagitis 03/14/2019   Intractable episodic headache 03/14/2019   Need for prophylactic vaccination with combined diphtheria-tetanus-pertussis (DTaP) vaccine 03/14/2019   Renal cyst 11/27/2018   Abnormality of gait and mobility 11/27/2018   Hepatic steatosis 10/31/2018   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[place]) 10/31/2018   Elevated ferritin 10/31/2018   Vertigo 08/13/2018   Gastroenteritis 05/09/2018   Diarrhea 05/09/2018   Nausea 05/09/2018   Personal history of colon cancer    Polyp of sigmoid colon    Dysuria 02/23/2018   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 02/10/2018   Encounter for general adult medical examination with abnormal findings 02/10/2018   Acute non-recurrent pansinusitis 08/24/2017   Other headache syndrome 08/24/2017   Uncontrolled type 2 diabetes mellitus  with hyperglycemia ([place]) 08/24/2017   Type 2 diabetes mellitus without complication, with long-term current use of insulin ([place]) 08/22/2017   Need for vaccination against Streptococcus pneumoniae using pneumococcal conjugate vaccine 13 08/22/2017   Essential hypertension 05/03/2017   Otalgia, bilateral 05/02/2017   Type 2 diabetes mellitus with hyperglycemia ([place]) 05/02/2017   Mixed hyperlipidemia 05/02/2017  Right lower quadrant pain 06/29/2016   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   History of colon cancer 04/13/2015   Cancer of ascending colon ([place]) 10/12/2014   Pura Spice, PT, DPT # [id number] Fara Olden, SPT 02/01/2021, 8:56 AM  [place] [facility] [address] [place], [place], [address] Phone: [phone]   Fax:  [phone]  Name: Tiffany Hawkins MRN: [id number] Date of Birth: 01/11/1946

## 2021-02-02 ENCOUNTER — Encounter: Payer: Self-pay | Admitting: Nurse Practitioner

## 2021-02-02 ENCOUNTER — Ambulatory Visit (INDEPENDENT_AMBULATORY_CARE_PROVIDER_SITE_OTHER): Payer: Medicare HMO | Admitting: Nurse Practitioner

## 2021-02-02 ENCOUNTER — Other Ambulatory Visit: Payer: Self-pay

## 2021-02-02 VITALS — BP 122/74 | HR 85 | Temp 98.0°F | Resp 16 | Ht 62.0 in | Wt 152.2 lb

## 2021-02-02 DIAGNOSIS — E119 Type 2 diabetes mellitus without complications: Secondary | ICD-10-CM | POA: Diagnosis not present

## 2021-02-02 DIAGNOSIS — Z794 Long term (current) use of insulin: Secondary | ICD-10-CM

## 2021-02-02 DIAGNOSIS — G4733 Obstructive sleep apnea (adult) (pediatric): Secondary | ICD-10-CM | POA: Diagnosis not present

## 2021-02-02 DIAGNOSIS — E782 Mixed hyperlipidemia: Secondary | ICD-10-CM

## 2021-02-02 DIAGNOSIS — N9489 Other specified conditions associated with female genital organs and menstrual cycle: Secondary | ICD-10-CM

## 2021-02-02 DIAGNOSIS — I700 Atherosclerosis of aorta: Secondary | ICD-10-CM | POA: Diagnosis not present

## 2021-02-02 LAB — POCT GLYCOSYLATED HEMOGLOBIN (HGB A1C): Hemoglobin A1C: 7 % — AB (ref 4.0–5.6)

## 2021-02-02 MED ORDER — FENOFIBRATE 54 MG PO TABS: 54.0000 mg | ORAL_TABLET | Freq: Every day | ORAL | 1 refills | Status: DC

## 2021-02-02 NOTE — Progress Notes (Signed)
[facility] [place], [place] [address]  Internal MEDICINE  Office Visit Note  Patient Name: Tiffany Hawkins  [id number]  [id number]  Date of Service: 02/02/2021  Chief Complaint  Patient presents with   Follow-up    [place] results, refills   Diabetes   Sleep Apnea   Hyperlipidemia   Hypertension   Anxiety    HPI Tiffany Hawkins presents for a follow up visit for hypertension, diabetes and to discuss ultrasound results. She is also in need of refill of fenofibrate.  Her blood pressure is stable on her current medications.  Her A1C has not been checked since April this year. Her A1C was 6.3 in April. Her A1C is 7.0 today. She is declining any medication adjustments and wants to focus on diet modifications to improve her A1C.  She had a carotid ultrasound in august 2022 with the result of less than 50% stenosis and no plaque formation bilaterally. She was also seen by Dr. Leotis Pain on 01/24/21 where she had a successful treatment of varicosities. 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   Current Medication: Outpatient Encounter Medications as of 02/02/2021  Medication Sig   albuterol (VENTOLIN HFA) 108 (90 Base) MCG/ACT inhaler Inhale 2 puffs into the lungs every 6 (six) hours as needed for wheezing or shortness of breath.   amLODipine (NORVASC) 5 MG tablet TAKE 2 TABLETS DAILY.   Blood Glucose Monitoring Suppl (TRUE METRIX METER) w/Device KIT USE AS DIRECTED   cetirizine (ZYRTEC) 10 MG chewable tablet Chew 1 tablet (10 mg total) by mouth daily.   chlorpheniramine-HYDROcodone (TUSSIONEX PENNKINETIC ER) 10-8 MG/5ML SUER Take 5 mLs by mouth at bedtime as needed for cough.   citalopram (CELEXA) 20 MG tablet TAKE 2 TABLETS DAILY FOR DEPRESSION.   Dapagliflozin Propanediol (FARXIGA PO) Take by mouth.   fluticasone (FLONASE) 50 MCG/ACT nasal spray Place 2 sprays into both  nostrils daily.   HYDROcodone-acetaminophen (NORCO/VICODIN) 5-325 MG tablet Take by mouth.   ibuprofen (ADVIL) 800 MG tablet Take 1 tablet (800 mg total) by mouth every 8 (eight) hours as needed.   insulin aspart protamine - aspart (NOVOLOG MIX 70/30 FLEXPEN) (70-30) 100 UNIT/ML FlexPen Inject 0.35 mLs (35 Units total) into the skin 2 (two) times daily. Inject 35 units [place] QAM. Gradually increase to 40units [place] QPM   lovastatin (MEVACOR) 20 MG tablet TAKE 1 TABLET AT BEDTIME FOR HIGH CHOLESTEROL   Multiple Vitamin (MULTIVITAMIN) tablet Take by mouth daily. TAKES 1/2 TABLET   pantoprazole (PROTONIX) 20 MG tablet Take 2 tablets (40 mg total) by mouth daily.   sharps container 1 each by Does not apply route as needed. To use for insulin syringes and needles. Taking insulin at least twice daily.  E11.65   spironolactone (ALDACTONE) 100 MG tablet Take 100 mg by mouth daily. 2 tablets once a day   TRUE METRIX BLOOD GLUCOSE TEST test strip TEST BLOOD SUGAR THREE TIMES DAILY AFTER MEALS   TRUEplus Lancets 33G MISC USE AS DIRECTED TO TEST BLOOD SUGAR THREE TIMES DAILY   [DISCONTINUED] fenofibrate 54 MG tablet Take 1 tablet (54 mg total) by mouth daily.   fenofibrate 54 MG tablet Take 1 tablet (54 mg total) by mouth daily.   No facility-administered encounter medications on file as of 02/02/2021.    Surgical History: Past Surgical History:  Procedure Laterality Date   APPENDECTOMY     BACK SURGERY  CATARACT EXTRACTION W/PHACO Left 12/03/2017   Procedure: CATARACT EXTRACTION PHACO AND INTRAOCULAR LENS PLACEMENT ([place]) LEFT  DIABETIC;  Surgeon: Eulogio Bear, MD;  Location: [facility];  Service: Ophthalmology;  Laterality: Left;  Diabetic - insulin   CATARACT EXTRACTION W/PHACO Right 01/14/2018   Procedure: CATARACT EXTRACTION PHACO AND INTRAOCULAR LENS PLACEMENT ([facility]) RIGHT DIABETIC;  Surgeon: Eulogio Bear, MD;  Location: [facility];  Service: Ophthalmology;  Laterality:  Right;  Diabetic - insulin   CERVICAL DISC SURGERY     CHOLECYSTECTOMY     COLON SURGERY     COLONOSCOPY     COLONOSCOPY WITH PROPOFOL N/A 05/17/2015   Procedure: COLONOSCOPY WITH PROPOFOL;  Surgeon: Manya Silvas, MD;  Location: [facility] ENDOSCOPY;  Service: Endoscopy;  Laterality: N/A;   COLONOSCOPY WITH PROPOFOL N/A 04/01/2018   Procedure: COLONOSCOPY WITH BIOPSY;  Surgeon: [name], MD;  Location: [facility];  Service: Endoscopy;  Laterality: N/A;  Diabetic - insulin   EMBOLIZATION N/A 01/24/2021   Procedure: EMBOLIZATION;  Surgeon: Algernon Huxley, MD;  Location: [facility] CV LAB;  Service: Cardiovascular;  Laterality: N/A;   ESOPHAGOGASTRODUODENOSCOPY     HERNIA REPAIR     LOWER EXTREMITY VENOGRAPHY Left 01/24/2021   Procedure: LOWER EXTREMITY VENOGRAPHY;  Surgeon: Algernon Huxley, MD;  Location: [place] [facility] LAB;  Service: Cardiovascular;  Laterality: Left;   POLYPECTOMY N/A 04/01/2018   Procedure: POLYPECTOMY;  Surgeon: [name], MD;  Location: [facility];  Service: Endoscopy;  Laterality: N/A;   PORT A CATH INJECTION ([place] HX)     Port has been removed   TUBAL LIGATION      Medical History: Past Medical History:  Diagnosis Date   Anxiety    Colon cancer ([place]) 10/12/2014   Stage IIB; had chemo   Diabetes mellitus without complication (HCC)    Hyperlipidemia    Hypertension    Lateral epicondylitis    Personal history of chemotherapy    Polio    childhood   Skin cancer    Sqamous Cell, Lt Calf   Sleep apnea    no CPAP, sx improved with wt loss    Family History: Family History  Problem Relation Age of Onset   Alzheimer's disease Mother    Heart disease Father    Colon cancer Cousin    Colon cancer Cousin    Cancer Cousin        Brain Tumor   Breast cancer Neg Hx     Social History   Socioeconomic History   Marital status: Married    Spouse name: Not on file   Number of children: Not on file   Years of education: Not on file    Highest education level: Not on file  Occupational History   Not on file  Tobacco Use   Smoking status: Former    Packs/day: 1.50    Years: 20.00    Pack years: 30.00    Types: Cigarettes    Quit date: 04/17/1981    Years since quitting: 39.8   Smokeless tobacco: Never  Vaping Use   Vaping Use: Never used  Substance and Sexual Activity   Alcohol use: No   Drug use: No   Sexual activity: Yes  Other Topics Concern   Not on file  Social History Narrative   Not on file   Social Determinants of Health   Financial Resource Strain: Not on file  Food Insecurity: Not on file  Transportation Needs: Not  on file  Physical Activity: Not on file  Stress: Not on file  Social Connections: Not on file  Intimate Partner Violence: Not on file      Review of Systems  Constitutional:  Negative for chills, fatigue and unexpected weight change.  HENT:  Negative for congestion, rhinorrhea, sneezing and sore throat.   Eyes:  Negative for redness.  Respiratory:  Negative for cough, chest tightness and shortness of breath.   Cardiovascular:  Negative for chest pain and palpitations.  Gastrointestinal:  Negative for abdominal pain, constipation, diarrhea, nausea and vomiting.  Genitourinary:  Negative for dysuria and frequency.  Musculoskeletal:  Negative for arthralgias, back pain, joint swelling and neck pain.  Skin:  Negative for rash.  Neurological: Negative.  Negative for tremors and numbness.  Hematological:  Negative for adenopathy. Does not bruise/bleed easily.  Psychiatric/Behavioral:  Negative for behavioral problems (Depression), sleep disturbance and suicidal ideas. The patient is not nervous/anxious.    Vital Signs: BP 122/74   Pulse 85   Temp 98 F (36.7 C)   Resp 16   Ht 5' 2"  (1.575 m)   Wt 152 lb 3.2 oz (69 kg)   SpO2 98%   BMI 27.84 kg/m    Physical Exam Vitals reviewed.  Constitutional:      General: She is not in acute distress.    Appearance: Normal  appearance. She is not ill-appearing.  HENT:     Head: Normocephalic and atraumatic.  Eyes:     Extraocular Movements: Extraocular movements intact.     Pupils: Pupils are equal, round, and reactive to light.  Cardiovascular:     Rate and Rhythm: Normal rate and regular rhythm.  Pulmonary:     Effort: Pulmonary effort is normal. No respiratory distress.  Neurological:     Mental Status: She is alert and oriented to person, place, and time.     Cranial Nerves: No cranial nerve deficit.     Coordination: Coordination normal.     Gait: Gait normal.  Psychiatric:        Mood and Affect: Mood normal.        Behavior: Behavior normal.       Assessment/Plan: 1. Type 2 diabetes mellitus without complication, with long-term current use of insulin (HCC) Patient declines medication adjustment for now, She wants to focus on diet modifications for now. Reapt A1C in 3 months.  - POCT HgB A1C  3. Female pelvic congestion syndrome Treated successfully by Dr. Leotis Pain.   4. Atherosclerosis of aorta (HCC) Fenofibrate refills ordered - fenofibrate 54 MG tablet; Take 1 tablet (54 mg total) by mouth daily.  Dispense: 90 tablet; Refill: 1   General Counseling: Doneen verbalizes understanding of the findings of todays visit and agrees with plan of treatment. I have discussed any further diagnostic evaluation that may be needed or ordered today. We also reviewed her medications today. she has been encouraged to call the office with any questions or concerns that should arise related to todays visit.    Orders Placed This Encounter  Procedures   POCT HgB A1C    Meds ordered this encounter  Medications   fenofibrate 54 MG tablet    Sig: Take 1 tablet (54 mg total) by mouth daily.    Dispense:  90 tablet    Refill:  1    Return in about 3 months (around 05/05/2021) for F/U, Recheck A1C, Kim Oki PCP.   Total time spent:30 Minutes Time spent includes review of chart, medications, test  results, and follow up plan with the patient.   [place] Controlled Substance Database was reviewed by me.  This patient was seen by Jonetta Osgood, FNP-C in collaboration with Dr. Clayborn Bigness as a part of collaborative care agreement.   Clee Pandit R. Valetta Fuller, MSN, FNP-C Internal medicine

## 2021-02-03 ENCOUNTER — Ambulatory Visit: Payer: Medicare HMO | Admitting: Physical Therapy

## 2021-02-03 ENCOUNTER — Encounter: Payer: Medicare HMO | Admitting: Physical Therapy

## 2021-02-03 DIAGNOSIS — B91 Sequelae of poliomyelitis: Secondary | ICD-10-CM

## 2021-02-03 DIAGNOSIS — R29898 Other symptoms and signs involving the musculoskeletal system: Secondary | ICD-10-CM | POA: Diagnosis not present

## 2021-02-03 DIAGNOSIS — Z9181 History of falling: Secondary | ICD-10-CM

## 2021-02-03 DIAGNOSIS — S322XXS Fracture of coccyx, sequela: Secondary | ICD-10-CM | POA: Diagnosis not present

## 2021-02-03 DIAGNOSIS — S3210XS Unspecified fracture of sacrum, sequela: Secondary | ICD-10-CM | POA: Diagnosis not present

## 2021-02-03 DIAGNOSIS — M89662 Osteopathy after poliomyelitis, left lower leg: Secondary | ICD-10-CM | POA: Diagnosis not present

## 2021-02-03 DIAGNOSIS — M545 Low back pain, unspecified: Secondary | ICD-10-CM

## 2021-02-05 NOTE — Therapy (Signed)
[facility] [address] [place], [place], [address] Phone: [phone]   Fax:  [phone]  Physical Therapy Treatment  Patient Details  Name: Tiffany Hawkins MRN: [id number] Date of Birth: 10/16/1945 Referring Provider (PT): Girtha Hake MD   Encounter Date: 02/03/2021   PT End of Session - 02/05/21 1211     Visit Number 6    Number of Visits 17    Date for PT Re-Evaluation 03/10/21    Authorization - Visit Number 6    Authorization - Number of Visits 10    Progress Note Due on Visit 10    PT Start Time 1113    PT Stop Time 1200    PT Time Calculation (min) 47 min    Equipment Utilized During Treatment Gait belt;Other (comment)   [phone] and FWW   Activity Tolerance Patient tolerated treatment well;Patient limited by fatigue    Behavior During Therapy [facility] for tasks assessed/performed             Past Medical History:  Diagnosis Date   Anxiety    Colon cancer ([place]) 10/12/2014   Stage IIB; had chemo   Diabetes mellitus without complication ([place])    Hyperlipidemia    Hypertension    Lateral epicondylitis    Personal history of chemotherapy    Polio    childhood   Skin cancer    Sqamous Cell, Lt Calf   Sleep apnea    no CPAP, sx improved with wt loss    Past Surgical History:  Procedure Laterality Date   APPENDECTOMY     BACK SURGERY     CATARACT EXTRACTION W/PHACO Left 12/03/2017   Procedure: CATARACT EXTRACTION PHACO AND INTRAOCULAR LENS PLACEMENT ([place]) LEFT  DIABETIC;  Surgeon: Eulogio Bear, MD;  Location: [place];  Service: Ophthalmology;  Laterality: Left;  Diabetic - insulin   CATARACT EXTRACTION W/PHACO Right 01/14/2018   Procedure: CATARACT EXTRACTION PHACO AND INTRAOCULAR LENS PLACEMENT ([facility]) RIGHT DIABETIC;  Surgeon: Eulogio Bear, MD;  Location: [facility];  Service: Ophthalmology;  Laterality: Right;  Diabetic - insulin   CERVICAL DISC SURGERY     CHOLECYSTECTOMY      COLON SURGERY     COLONOSCOPY     COLONOSCOPY WITH PROPOFOL N/A 05/17/2015   Procedure: COLONOSCOPY WITH PROPOFOL;  Surgeon: Manya Silvas, MD;  Location: [facility] ENDOSCOPY;  Service: Endoscopy;  Laterality: N/A;   COLONOSCOPY WITH PROPOFOL N/A 04/01/2018   Procedure: COLONOSCOPY WITH BIOPSY;  Surgeon: [name], MD;  Location: [facility];  Service: Endoscopy;  Laterality: N/A;  Diabetic - insulin   EMBOLIZATION N/A 01/24/2021   Procedure: EMBOLIZATION;  Surgeon: Algernon Huxley, MD;  Location: [place] CV LAB;  Service: Cardiovascular;  Laterality: N/A;   ESOPHAGOGASTRODUODENOSCOPY     HERNIA REPAIR     LOWER EXTREMITY VENOGRAPHY Left 01/24/2021   Procedure: LOWER EXTREMITY VENOGRAPHY;  Surgeon: Algernon Huxley, MD;  Location: [place] CV LAB;  Service: Cardiovascular;  Laterality: Left;   POLYPECTOMY N/A 04/01/2018   Procedure: POLYPECTOMY;  Surgeon: [name], MD;  Location: [facility];  Service: Endoscopy;  Laterality: N/A;   PORT A CATH INJECTION ([place] HX)     Port has been removed   TUBAL LIGATION      There were no vitals filed for this visit.   Subjective Assessment - 02/05/21 1208     Subjective Pt. entered PT with no assistive device and a cautious  gait pattern.  Pt. states she may return to church this Sunday and is concerned about the stairs.    Limitations Walking;House hold activities;Sitting;Lifting;Standing    Diagnostic tests MRI of lumbar and cervical spine. See imaging history.    Patient Stated Goals Reduce pain and improve balance.    Currently in Pain? No/denies                 Ther.ex.:   Nustep L3 B UE/LE (warm-up).     Standing hip ex. (All planes)- no wt./ reassessment of B LE MMT.       Neuro.mm.:   Walking with [facility] and mirror feedback in gym (limited hip/knee flexion).  Poor endurance/ seated rest breaks.     Walking in //-bars with turning CW/CCW.   Cone taps with mirror feedback.    Obstacle course in hallway  (maneuvering cones/ Airex/ hurdles)- CGA to min. A for safety.    Stair training with CGA-min A to ascend with recip. Pattern and descend with step to pattern.    Blue mat (uneven terrain gait) in //-bars.         PT Long Term Goals - 01/13/21 1239       PT LONG TERM GOAL #1   Title Pt will improve MMT scores by 1/2 grade to promote functional strength gains for transfers, amb, and decreased caregiver burden.    Baseline 4/4- Hip flexion  4/4-  Hip abduction  4/4-  Hip adduction  4/3+  Knee extension  4/3+ Knee flexion 4/3+ Ankle dorsiflexion  4/3+ Ankle plantarflexion    Time 8    Period Weeks    Status New    Target Date 03/10/21      PT LONG TERM GOAL #2   Title Pt will improve BERG score by 5 pts to promote safety with ambulation and functional tasks.    Baseline BERG: 18/56    Time 8    Period Weeks    Status New    Target Date 03/10/21      PT LONG TERM GOAL #3   Title Pt will improve FOTO score to predicted improvement value of 56 to measure self reported improvement in functional mobility.    Baseline 36    Time 8    Period Weeks    Status New    Target Date 03/10/21      PT LONG TERM GOAL #4   Title Pt will demonstrates safety with ambulation using FWW with no LOB and only occ verbal cueing and SBA with 10 minutes of overground walking.    Baseline Pt amb 1 min with extensive verbal cueing CGA-min A.    Time 8    Period Weeks    Status New    Target Date 03/10/21      PT LONG TERM GOAL #5   Title Pt will decrease worse pain to 7/10 or less to promote greater access to functional mobility.    Baseline Present 7/10, Best 4/10, Worst 10/10:    Time 8    Period Weeks    Status New    Target Date 03/10/21                   Plan - 02/05/21 1213     Clinical Impression Statement Moderate B LE muscle fatigue noted as tx. progressed.  Extra time/ CGA to min. A with dynamic balance tasks in hallway/ obstacle course.  Pt. able to safely ascend stairs  with recip. pattern  and 1 UE assist but benefits from step to pattern with UE assist secondary increase fall risk.  PT reviewed stairs with pt. to benefit pt. with attending church this Sunday.  B hip flexion 4+/5 MMT during seated strength reassessment. Pt. remains resistance to use of RW.    Personal Factors and Comorbidities Comorbidity 3+;Age;Transportation;Fitness    Comorbidities Hx cancer, previous back surgery, DM, vertigo, Aortic atherosclerosis ([place])    Examination-Activity Limitations Stairs;Reach Overhead;Stand;Dressing;Sit;Transfers;Lift;Caring for Others;Carry;Locomotion Level;Squat    Examination-Participation Restrictions Interpersonal Relationship;Cleaning;Laundry;Yard Work;Driving;Meal Prep;Shop    Stability/Clinical Decision Making Evolving/Moderate complexity    Clinical Decision Making Moderate    Rehab Potential Fair    PT Frequency 2x / week    PT Duration 8 weeks    PT Treatment/Interventions ADLs/Self Care Home Management;Aquatic Therapy;Neuromuscular re-education;Balance training;Therapeutic exercise;Therapeutic activities;Functional mobility training;Stair training;Gait training;DME Instruction;Patient/family education;Manual techniques;Energy conservation;Biofeedback;Electrical Stimulation;Moist Heat;Traction;Ultrasound;Dry needling;Passive range of motion;Vestibular;Vasopneumatic Device;Spinal Manipulations;Joint Manipulations    PT Next Visit Plan Reassess HEP/ dynamic balance ex.    PT Home Exercise Plan deferred    Consulted and Agree with Plan of Care Patient             Patient will benefit from skilled therapeutic intervention in order to improve the following deficits and impairments:  Abnormal gait, Decreased endurance, Decreased activity tolerance, Decreased strength, Pain, Difficulty walking, Decreased mobility, Decreased balance, Decreased range of motion, Improper body mechanics, Postural dysfunction, Decreased safety awareness, Decreased coordination,  Decreased knowledge of precautions, Decreased knowledge of use of DME, Dizziness, Hypomobility, Impaired flexibility, Impaired sensation, Impaired perceived functional ability  Visit Diagnosis: Bilateral low back pain, unspecified chronicity, unspecified whether sciatica present  At high risk for injury related to fall  Closed fracture of sacrum and coccyx, sequela  Bilateral leg weakness  Polio osteopathy of lower leg, left ([place])     Problem List Patient Active Problem List   Diagnosis Date Noted   Aortic atherosclerosis ([place]) 12/29/2020   Deformity of toe of left foot 03/28/2020   H/O multiple pulmonary nodules 09/22/2019   Chronic cough 09/22/2019   Perennial allergic rhinitis 09/17/2019   Acute upper respiratory infection 08/28/2019   Weakness 08/28/2019   Acute bronchitis 06/01/2019   Cough productive of yellow sputum 06/01/2019   Vasomotor rhinitis 05/01/2019   Chronic kidney disease, stage II (mild) 03/14/2019   Gastroesophageal reflux disease without esophagitis 03/14/2019   Intractable episodic headache 03/14/2019   Need for prophylactic vaccination with combined diphtheria-tetanus-pertussis (DTaP) vaccine 03/14/2019   Renal cyst 11/27/2018   Abnormality of gait and mobility 11/27/2018   Hepatic steatosis 10/31/2018   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[place]) 10/31/2018   Elevated ferritin 10/31/2018   Vertigo 08/13/2018   Gastroenteritis 05/09/2018   Diarrhea 05/09/2018   Nausea 05/09/2018   Personal history of colon cancer    Polyp of sigmoid colon    Dysuria 02/23/2018   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 02/10/2018   Encounter for general adult medical examination with abnormal findings 02/10/2018   Acute non-recurrent pansinusitis 08/24/2017   Other headache syndrome 08/24/2017   Uncontrolled type 2 diabetes mellitus with hyperglycemia ([place]) 08/24/2017   Type 2 diabetes mellitus without complication, with long-term current use of insulin  ([place]) 08/22/2017   Need for vaccination against Streptococcus pneumoniae using pneumococcal conjugate vaccine 13 08/22/2017   Essential hypertension 05/03/2017   Otalgia, bilateral 05/02/2017   Type 2 diabetes mellitus with hyperglycemia ([place]) 05/02/2017   Mixed hyperlipidemia 05/02/2017   Right lower quadrant pain 06/29/2016  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 [place], [place], [address] Phone: [phone]   Fax:  [phone]  Name: IRINE HEMINGER MRN: [id number] Date of Birth: 1946/04/15

## 2021-02-07 ENCOUNTER — Other Ambulatory Visit: Payer: Self-pay

## 2021-02-07 ENCOUNTER — Ambulatory Visit: Payer: Medicare HMO | Admitting: Physical Therapy

## 2021-02-07 DIAGNOSIS — Z9181 History of falling: Secondary | ICD-10-CM | POA: Diagnosis not present

## 2021-02-07 DIAGNOSIS — S3210XS Unspecified fracture of sacrum, sequela: Secondary | ICD-10-CM | POA: Diagnosis not present

## 2021-02-07 DIAGNOSIS — B91 Sequelae of poliomyelitis: Secondary | ICD-10-CM | POA: Diagnosis not present

## 2021-02-07 DIAGNOSIS — M545 Low back pain, unspecified: Secondary | ICD-10-CM | POA: Diagnosis not present

## 2021-02-07 DIAGNOSIS — M89662 Osteopathy after poliomyelitis, left lower leg: Secondary | ICD-10-CM | POA: Diagnosis not present

## 2021-02-07 DIAGNOSIS — S322XXS Fracture of coccyx, sequela: Secondary | ICD-10-CM

## 2021-02-07 DIAGNOSIS — R29898 Other symptoms and signs involving the musculoskeletal system: Secondary | ICD-10-CM | POA: Diagnosis not present

## 2021-02-08 ENCOUNTER — Encounter: Payer: Medicare HMO | Admitting: Physical Therapy

## 2021-02-10 ENCOUNTER — Other Ambulatory Visit: Payer: Self-pay

## 2021-02-10 ENCOUNTER — Encounter: Payer: Medicare HMO | Admitting: Physical Therapy

## 2021-02-10 ENCOUNTER — Ambulatory Visit: Payer: Medicare HMO | Admitting: Physical Therapy

## 2021-02-10 DIAGNOSIS — M545 Low back pain, unspecified: Secondary | ICD-10-CM

## 2021-02-10 DIAGNOSIS — B91 Sequelae of poliomyelitis: Secondary | ICD-10-CM | POA: Diagnosis not present

## 2021-02-10 DIAGNOSIS — R29898 Other symptoms and signs involving the musculoskeletal system: Secondary | ICD-10-CM | POA: Diagnosis not present

## 2021-02-10 DIAGNOSIS — S3210XS Unspecified fracture of sacrum, sequela: Secondary | ICD-10-CM | POA: Diagnosis not present

## 2021-02-10 DIAGNOSIS — M89662 Osteopathy after poliomyelitis, left lower leg: Secondary | ICD-10-CM | POA: Diagnosis not present

## 2021-02-10 DIAGNOSIS — Z9181 History of falling: Secondary | ICD-10-CM | POA: Diagnosis not present

## 2021-02-10 DIAGNOSIS — S322XXS Fracture of coccyx, sequela: Secondary | ICD-10-CM | POA: Diagnosis not present

## 2021-02-14 ENCOUNTER — Encounter: Payer: Medicare HMO | Admitting: Physical Therapy

## 2021-02-14 NOTE — Therapy (Signed)
[facility] [address] [place], [place], [address] Phone: [phone]   Fax:  [phone]  Physical Therapy Treatment  Patient Details  Name: Tiffany Hawkins MRN: [id number] Date of Birth: [birth year] Referring Provider (PT): Girtha Hake MD   Encounter Date: 02/10/2021   PT End of Session - 02/14/21 0906     Visit Number 8    Number of Visits 17    Date for PT Re-Evaluation 03/10/21    Authorization - Visit Number 8    Authorization - Number of Visits 10    Progress Note Due on Visit 10    PT Start Time 1111    PT Stop Time 1201    PT Time Calculation (min) 50 min    Equipment Utilized During Treatment Gait belt;Other (comment)   [phone] and FWW   Activity Tolerance Patient tolerated treatment well;Patient limited by fatigue    Behavior During Therapy [facility] for tasks assessed/performed             Past Medical History:  Diagnosis Date   Anxiety    Colon cancer ([place]) 10/12/2014   Stage IIB; had chemo   Diabetes mellitus without complication ([place])    Hyperlipidemia    Hypertension    Lateral epicondylitis    Personal history of chemotherapy    Polio    childhood   Skin cancer    Sqamous Cell, Lt Calf   Sleep apnea    no CPAP, sx improved with wt loss    Past Surgical History:  Procedure Laterality Date   APPENDECTOMY     BACK SURGERY     CATARACT EXTRACTION W/PHACO Left 12/03/2017   Procedure: CATARACT EXTRACTION PHACO AND INTRAOCULAR LENS PLACEMENT ([place]) LEFT  DIABETIC;  Surgeon: Eulogio Bear, MD;  Location: [place];  Service: Ophthalmology;  Laterality: Left;  Diabetic - insulin   CATARACT EXTRACTION W/PHACO Right 01/14/2018   Procedure: CATARACT EXTRACTION PHACO AND INTRAOCULAR LENS PLACEMENT ([facility]) RIGHT DIABETIC;  Surgeon: Eulogio Bear, MD;  Location: [place];  Service: Ophthalmology;  Laterality: Right;  Diabetic - insulin   CERVICAL DISC SURGERY     CHOLECYSTECTOMY      COLON SURGERY     COLONOSCOPY     COLONOSCOPY WITH PROPOFOL N/A 05/17/2015   Procedure: COLONOSCOPY WITH PROPOFOL;  Surgeon: Manya Silvas, MD;  Location: [facility] ENDOSCOPY;  Service: Endoscopy;  Laterality: N/A;   COLONOSCOPY WITH PROPOFOL N/A 04/01/2018   Procedure: COLONOSCOPY WITH BIOPSY;  Surgeon: [name], MD;  Location: [place];  Service: Endoscopy;  Laterality: N/A;  Diabetic - insulin   EMBOLIZATION N/A 01/24/2021   Procedure: EMBOLIZATION;  Surgeon: Algernon Huxley, MD;  Location: [place] CV LAB;  Service: Cardiovascular;  Laterality: N/A;   ESOPHAGOGASTRODUODENOSCOPY     HERNIA REPAIR     LOWER EXTREMITY VENOGRAPHY Left 01/24/2021   Procedure: LOWER EXTREMITY VENOGRAPHY;  Surgeon: Algernon Huxley, MD;  Location: [place] CV LAB;  Service: Cardiovascular;  Laterality: Left;   POLYPECTOMY N/A 04/01/2018   Procedure: POLYPECTOMY;  Surgeon: [name], MD;  Location: [place];  Service: Endoscopy;  Laterality: N/A;   PORT A CATH INJECTION ([place] HX)     Port has been removed   TUBAL LIGATION      There were no vitals filed for this visit.   Subjective Assessment - 02/14/21 0859     Subjective No new complaints.  Pt. states she has chronic discomfort  in low back but didn't state a subjective pain score prior to PT tx. session.    Pertinent History per referring note: "Patient notes that she does have a history of chronic back pain which did worsen after her fall. She also endorses a history of polio. Over the past month she feels that she has been more off balance and has had more falls. She is also noticing worsening weakness in her legs. She rates her pain as an 8/10. Is intermittent, stiff. She denies any numbness, tingling or loss of control of bowel or bladder. " H/o Hx cancer, previous back surgery, DM, vertigo, Aortic atherosclerosis ([place]).    Limitations Walking;House hold activities;Sitting;Lifting;Standing    Diagnostic tests MRI of lumbar and  cervical spine. See imaging history.    Patient Stated Goals Reduce pain and improve balance.    Currently in Pain? Yes    Pain Location Back              Ther.ex.:   Nustep L4 B UE/LE (warm-up)- discussed daily walking/ activities at home.     Assessed ankle brace pt. Was issued at last PT clinic.  PT does not recommend use of ankle brace at this time.    Standing hip ex.: 3# marching/ hip abduction/ heel raises with light UE assist 20x each.  Discussed/reviewed HEP       Neuro.mm.:   Walking in hallway with turning CW/CCW.   Alt. UE/LE touches in //-bars and progressing to hallway (extra time and cuing to complete tasks).    Obstacle course in hallway (maneuvering cones/ Airex/ steps)- CGA to min. A for safety.    Stair training with CGA-min A to ascend with recip. Pattern and descend with step to pattern.     Walking outside on sidewalks/ curbs/ ramp and grassy terrain at front of clinic.  Min. To mod A for safety, esp. With descending/ ascending curbs.         PT Long Term Goals - 01/13/21 1239       PT LONG TERM GOAL #1   Title Pt will improve MMT scores by 1/2 grade to promote functional strength gains for transfers, amb, and decreased caregiver burden.    Baseline 4/4- Hip flexion  4/4-  Hip abduction  4/4-  Hip adduction  4/3+  Knee extension  4/3+ Knee flexion 4/3+ Ankle dorsiflexion  4/3+ Ankle plantarflexion    Time 8    Period Weeks    Status New    Target Date 03/10/21      PT LONG TERM GOAL #2   Title Pt will improve BERG score by 5 pts to promote safety with ambulation and functional tasks.    Baseline BERG: 18/56    Time 8    Period Weeks    Status New    Target Date 03/10/21      PT LONG TERM GOAL #3   Title Pt will improve FOTO score to predicted improvement value of 56 to measure self reported improvement in functional mobility.    Baseline 36    Time 8    Period Weeks    Status New    Target Date 03/10/21      PT LONG TERM GOAL #4    Title Pt will demonstrates safety with ambulation using FWW with no LOB and only occ verbal cueing and SBA with 10 minutes of overground walking.    Baseline Pt amb 1 min with extensive verbal cueing CGA-min A.  Time 8    Period Weeks    Status New    Target Date 03/10/21      PT LONG TERM GOAL #5   Title Pt will decrease worse pain to 7/10 or less to promote greater access to functional mobility.    Baseline Present 7/10, Best 4/10, Worst 10/10:    Time 8    Period Weeks    Status New    Target Date 03/10/21                   Plan - 02/14/21 0907     Clinical Impression Statement Moderate B LE muscle fatigue as tx. progresses with LE strengthening/ balance tasks in gym with less UE assist.  Pt. requires at least CGA with all standing/ walking tasks for safety and cuing.  Several episodes of scissoring gait pattern while walking in hallway/ turning requiring a touch on wall or PT assist to prevent fall.  No increase c/o back pain but fatigue in low back/ LE noted at end of tx.  LE fatigues limits pts. ability to walk with a recip. pattern on grassy/ uneven terrain.    Personal Factors and Comorbidities Comorbidity 3+;Age;Transportation;Fitness    Comorbidities Hx cancer, previous back surgery, DM, vertigo, Aortic atherosclerosis ([place])    Examination-Activity Limitations Stairs;Reach Overhead;Stand;Dressing;Sit;Transfers;Lift;Caring for Others;Carry;Locomotion Level;Squat    Examination-Participation Restrictions Interpersonal Relationship;Cleaning;Laundry;Yard Work;Driving;Meal Prep;Shop    Stability/Clinical Decision Making Evolving/Moderate complexity    Clinical Decision Making Moderate    Rehab Potential Fair    PT Frequency 2x / week    PT Duration 8 weeks    PT Treatment/Interventions ADLs/Self Care Home Management;Aquatic Therapy;Neuromuscular re-education;Balance training;Therapeutic exercise;Therapeutic activities;Functional mobility training;Stair training;Gait  training;DME Instruction;Patient/family education;Manual techniques;Energy conservation;Biofeedback;Electrical Stimulation;Moist Heat;Traction;Ultrasound;Dry needling;Passive range of motion;Vestibular;Vasopneumatic Device;Spinal Manipulations;Joint Manipulations    PT Next Visit Plan Discuss stairs at [place].    PT Home Exercise Plan deferred    Consulted and Agree with Plan of Care Patient             Patient will benefit from skilled therapeutic intervention in order to improve the following deficits and impairments:  Abnormal gait, Decreased endurance, Decreased activity tolerance, Decreased strength, Pain, Difficulty walking, Decreased mobility, Decreased balance, Decreased range of motion, Improper body mechanics, Postural dysfunction, Decreased safety awareness, Decreased coordination, Decreased knowledge of precautions, Decreased knowledge of use of DME, Dizziness, Hypomobility, Impaired flexibility, Impaired sensation, Impaired perceived functional ability  Visit Diagnosis: Bilateral low back pain, unspecified chronicity, unspecified whether sciatica present  At high risk for injury related to fall  Closed fracture of sacrum and coccyx, sequela  Bilateral leg weakness  Polio osteopathy of lower leg, left ([place])     Problem List Patient Active Problem List   Diagnosis Date Noted   Aortic atherosclerosis ([place]) 12/29/2020   Deformity of toe of left foot 03/28/2020   H/O multiple pulmonary nodules 09/22/2019   Chronic cough 09/22/2019   Perennial allergic rhinitis 09/17/2019   Acute upper respiratory infection 08/28/2019   Weakness 08/28/2019   Acute bronchitis 06/01/2019   Cough productive of yellow sputum 06/01/2019   Vasomotor rhinitis 05/01/2019   Chronic kidney disease, stage II (mild) 03/14/2019   Gastroesophageal reflux disease without esophagitis 03/14/2019   Intractable episodic headache 03/14/2019   Need for prophylactic vaccination with combined  diphtheria-tetanus-pertussis (DTaP) vaccine 03/14/2019   Renal cyst 11/27/2018   Abnormality of gait and mobility 11/27/2018   Hepatic steatosis 10/31/2018   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[place]) 10/31/2018  Elevated ferritin 10/31/2018   Vertigo 08/13/2018   Gastroenteritis 05/09/2018   Diarrhea 05/09/2018   Nausea 05/09/2018   Personal history of colon cancer    Polyp of sigmoid colon    Dysuria 02/23/2018   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 02/10/2018   Encounter for general adult medical examination with abnormal findings 02/10/2018   Acute non-recurrent pansinusitis 08/24/2017   Other headache syndrome 08/24/2017   Uncontrolled type 2 diabetes mellitus with hyperglycemia ([place]) 08/24/2017   Type 2 diabetes mellitus without complication, with long-term current use of insulin ([place]) 08/22/2017   Need for vaccination against Streptococcus pneumoniae using pneumococcal conjugate vaccine 13 08/22/2017   Essential hypertension 05/03/2017   Otalgia, bilateral 05/02/2017   Type 2 diabetes mellitus with hyperglycemia ([place]) 05/02/2017   Mixed hyperlipidemia 05/02/2017   Right lower quadrant pain 06/29/2016   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   History of colon cancer 04/13/2015   Cancer of ascending colon ([place]) 10/12/2014   Pura Spice, PT, DPT # [phone] 02/14/2021, 11:05 AM  [place] [facility] [address] [place], [place], [address] Phone: [phone]   Fax:  [phone]  Name: Tiffany Hawkins MRN: [id number] Date of Birth: 1946-03-06

## 2021-02-14 NOTE — Therapy (Signed)
[place] [facility] [address] [place], [place], [address] Phone: [phone]   Fax:  [phone]  Physical Therapy Treatment  Patient Details  Name: Tiffany Hawkins MRN: [id number] Date of Birth: June 09, 1945 Referring Provider (PT): Girtha Hake MD   Encounter Date: 02/07/2021   PT End of Session - 02/14/21 0841     Visit Number 7    Number of Visits 17    Date for PT Re-Evaluation 03/10/21    Authorization - Visit Number 7    Authorization - Number of Visits 10    Progress Note Due on Visit 10    PT Start Time 1118    PT Stop Time 1203    PT Time Calculation (min) 45 min    Equipment Utilized During Treatment Gait belt;Other (comment)   [phone] and FWW   Activity Tolerance Patient tolerated treatment well;Patient limited by fatigue    Behavior During Therapy [facility] for tasks assessed/performed             Past Medical History:  Diagnosis Date   Anxiety    Colon cancer ([place]) 10/12/2014   Stage IIB; had chemo   Diabetes mellitus without complication ([place])    Hyperlipidemia    Hypertension    Lateral epicondylitis    Personal history of chemotherapy    Polio    childhood   Skin cancer    Sqamous Cell, Lt Calf   Sleep apnea    no CPAP, sx improved with wt loss    Past Surgical History:  Procedure Laterality Date   APPENDECTOMY     BACK SURGERY     CATARACT EXTRACTION W/PHACO Left 12/03/2017   Procedure: CATARACT EXTRACTION PHACO AND INTRAOCULAR LENS PLACEMENT ([place]) LEFT  DIABETIC;  Surgeon: Eulogio Bear, MD;  Location: [place];  Service: Ophthalmology;  Laterality: Left;  Diabetic - insulin   CATARACT EXTRACTION W/PHACO Right 01/14/2018   Procedure: CATARACT EXTRACTION PHACO AND INTRAOCULAR LENS PLACEMENT ([facility]) RIGHT DIABETIC;  Surgeon: Eulogio Bear, MD;  Location: [place];  Service: Ophthalmology;  Laterality: Right;  Diabetic - insulin   CERVICAL DISC SURGERY     CHOLECYSTECTOMY      COLON SURGERY     COLONOSCOPY     COLONOSCOPY WITH PROPOFOL N/A 05/17/2015   Procedure: COLONOSCOPY WITH PROPOFOL;  Surgeon: Manya Silvas, MD;  Location: [facility] ENDOSCOPY;  Service: Endoscopy;  Laterality: N/A;   COLONOSCOPY WITH PROPOFOL N/A 04/01/2018   Procedure: COLONOSCOPY WITH BIOPSY;  Surgeon: [name], MD;  Location: [place];  Service: Endoscopy;  Laterality: N/A;  Diabetic - insulin   EMBOLIZATION N/A 01/24/2021   Procedure: EMBOLIZATION;  Surgeon: Algernon Huxley, MD;  Location: [place] CV LAB;  Service: Cardiovascular;  Laterality: N/A;   ESOPHAGOGASTRODUODENOSCOPY     HERNIA REPAIR     LOWER EXTREMITY VENOGRAPHY Left 01/24/2021   Procedure: LOWER EXTREMITY VENOGRAPHY;  Surgeon: Algernon Huxley, MD;  Location: [place] CV LAB;  Service: Cardiovascular;  Laterality: Left;   POLYPECTOMY N/A 04/01/2018   Procedure: POLYPECTOMY;  Surgeon: [name], MD;  Location: [place];  Service: Endoscopy;  Laterality: N/A;   PORT A CATH INJECTION ([place] HX)     Port has been removed   TUBAL LIGATION      There were no vitals filed for this visit.   Subjective Assessment - 02/14/21 0836     Subjective Pt. states she did not go to [place] this past  Sunday but is really hoping to attend in person this upcoming Sunday.  Pt. concerned about ascending/ descending stairs.  No recent falls reported.    Pertinent History per referring note: "Patient notes that she does have a history of chronic back pain which did worsen after her fall. She also endorses a history of polio. Over the past month she feels that she has been more off balance and has had more falls. She is also noticing worsening weakness in her legs. She rates her pain as an 8/10. Is intermittent, stiff. She denies any numbness, tingling or loss of control of bowel or bladder. " H/o Hx cancer, previous back surgery, DM, vertigo, Aortic atherosclerosis ([place]).    Limitations Walking;House hold  activities;Sitting;Lifting;Standing    Diagnostic tests MRI of lumbar and cervical spine. See imaging history.    Patient Stated Goals Reduce pain and improve balance.    Currently in Pain? No/denies              Ther.ex.:   Nustep L3-4 B UE/LE (warm-up).     Standing hip ex. (All planes)- 2# all planes.  Seated 2# LAQ/ heel raises/ step ups 10x2 each.       Neuro.mm.:   Walking in hallway with turning CW/CCW.   Cone taps with mirror feedback.     Obstacle course in hallway (maneuvering cones/ Airex/ hurdles)- CGA to min. A for safety.    Stair training with CGA-min A to ascend with recip. Pattern and descend with step to pattern.     Blue mat (uneven terrain gait) in //-bars.         PT Long Term Goals - 01/13/21 1239       PT LONG TERM GOAL #1   Title Pt will improve MMT scores by 1/2 grade to promote functional strength gains for transfers, amb, and decreased caregiver burden.    Baseline 4/4- Hip flexion  4/4-  Hip abduction  4/4-  Hip adduction  4/3+  Knee extension  4/3+ Knee flexion 4/3+ Ankle dorsiflexion  4/3+ Ankle plantarflexion    Time 8    Period Weeks    Status New    Target Date 03/10/21      PT LONG TERM GOAL #2   Title Pt will improve BERG score by 5 pts to promote safety with ambulation and functional tasks.    Baseline BERG: 18/56    Time 8    Period Weeks    Status New    Target Date 03/10/21      PT LONG TERM GOAL #3   Title Pt will improve FOTO score to predicted improvement value of 56 to measure self reported improvement in functional mobility.    Baseline 36    Time 8    Period Weeks    Status New    Target Date 03/10/21      PT LONG TERM GOAL #4   Title Pt will demonstrates safety with ambulation using FWW with no LOB and only occ verbal cueing and SBA with 10 minutes of overground walking.    Baseline Pt amb 1 min with extensive verbal cueing CGA-min A.    Time 8    Period Weeks    Status New    Target Date 03/10/21       PT LONG TERM GOAL #5   Title Pt will decrease worse pain to 7/10 or less to promote greater access to functional mobility.    Baseline Present 7/10, Best 4/10, Worst  10/10:    Time 8    Period Weeks    Status New    Target Date 03/10/21                   Plan - 02/14/21 0842     Clinical Impression Statement Pt. challenged with obstacle course/ walking on uneven terrain and requires min./mod. A to prevent falls without use of SPC.  Pt. very fearful of falling and challenged with recip. step pattern, esp. on uneven terrain/ thresholds.  LE muscle fatigue noted as tx. progresses and extra time to safely complete turning in hallway with no assistive device.    Personal Factors and Comorbidities Comorbidity 3+;Age;Transportation;Fitness    Comorbidities Hx cancer, previous back surgery, DM, vertigo, Aortic atherosclerosis ([place])    Examination-Activity Limitations Stairs;Reach Overhead;Stand;Dressing;Sit;Transfers;Lift;Caring for Others;Carry;Locomotion Level;Squat    Examination-Participation Restrictions Interpersonal Relationship;Cleaning;Laundry;Yard Work;Driving;Meal Prep;Shop    Stability/Clinical Decision Making Evolving/Moderate complexity    Clinical Decision Making Moderate    Rehab Potential Fair    PT Frequency 2x / week    PT Duration 8 weeks    PT Treatment/Interventions ADLs/Self Care Home Management;Aquatic Therapy;Neuromuscular re-education;Balance training;Therapeutic exercise;Therapeutic activities;Functional mobility training;Stair training;Gait training;DME Instruction;Patient/family education;Manual techniques;Energy conservation;Biofeedback;Electrical Stimulation;Moist Heat;Traction;Ultrasound;Dry needling;Passive range of motion;Vestibular;Vasopneumatic Device;Spinal Manipulations;Joint Manipulations    PT Next Visit Plan Reassess HEP/ dynamic balance ex.    PT Home Exercise Plan deferred    Consulted and Agree with Plan of Care Patient             Patient  will benefit from skilled therapeutic intervention in order to improve the following deficits and impairments:  Abnormal gait, Decreased endurance, Decreased activity tolerance, Decreased strength, Pain, Difficulty walking, Decreased mobility, Decreased balance, Decreased range of motion, Improper body mechanics, Postural dysfunction, Decreased safety awareness, Decreased coordination, Decreased knowledge of precautions, Decreased knowledge of use of DME, Dizziness, Hypomobility, Impaired flexibility, Impaired sensation, Impaired perceived functional ability  Visit Diagnosis: Bilateral low back pain, unspecified chronicity, unspecified whether sciatica present  At high risk for injury related to fall  Closed fracture of sacrum and coccyx, sequela  Bilateral leg weakness  Polio osteopathy of lower leg, left [facility])     Problem List Patient Active Problem List   Diagnosis Date Noted   Aortic atherosclerosis ([place]) 12/29/2020   Deformity of toe of left foot 03/28/2020   H/O multiple pulmonary nodules 09/22/2019   Chronic cough 09/22/2019   Perennial allergic rhinitis 09/17/2019   Acute upper respiratory infection 08/28/2019   Weakness 08/28/2019   Acute bronchitis 06/01/2019   Cough productive of yellow sputum 06/01/2019   Vasomotor rhinitis 05/01/2019   Chronic kidney disease, stage II (mild) 03/14/2019   Gastroesophageal reflux disease without esophagitis 03/14/2019   Intractable episodic headache 03/14/2019   Need for prophylactic vaccination with combined diphtheria-tetanus-pertussis (DTaP) vaccine 03/14/2019   Renal cyst 11/27/2018   Abnormality of gait and mobility 11/27/2018   Hepatic steatosis 10/31/2018   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[place]) 10/31/2018   Elevated ferritin 10/31/2018   Vertigo 08/13/2018   Gastroenteritis 05/09/2018   Diarrhea 05/09/2018   Nausea 05/09/2018   Personal history of colon cancer    Polyp of sigmoid colon    Dysuria 02/23/2018    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 02/10/2018   Encounter for general adult medical examination with abnormal findings 02/10/2018   Acute non-recurrent pansinusitis 08/24/2017   Other headache syndrome 08/24/2017   Uncontrolled type 2 diabetes mellitus with hyperglycemia ([place]) 08/24/2017   Type  2 diabetes mellitus without complication, with long-term current use of insulin ([place]) 08/22/2017   Need for vaccination against Streptococcus pneumoniae using pneumococcal conjugate vaccine 13 08/22/2017   Essential hypertension 05/03/2017   Otalgia, bilateral 05/02/2017   Type 2 diabetes mellitus with hyperglycemia ([place]) 05/02/2017   Mixed hyperlipidemia 05/02/2017   Right lower quadrant pain 06/29/2016   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   History of colon cancer 04/13/2015   Cancer of ascending colon ([place]) 10/12/2014   Pura Spice, PT, DPT # [phone] 02/14/2021, 8:54 AM  [place] [facility] [address] [place], [place], [address] Phone: [phone]   Fax:  [phone]  Name: CARSYN BOSTER MRN: [id number] Date of Birth: 15-Mar-1946

## 2021-02-15 ENCOUNTER — Encounter: Payer: Medicare HMO | Admitting: Physical Therapy

## 2021-02-17 ENCOUNTER — Encounter: Payer: Self-pay | Admitting: Physical Therapy

## 2021-02-17 ENCOUNTER — Other Ambulatory Visit: Payer: Self-pay

## 2021-02-17 ENCOUNTER — Encounter: Payer: Medicare HMO | Admitting: Physical Therapy

## 2021-02-17 ENCOUNTER — Ambulatory Visit: Payer: Medicare HMO | Attending: Physical Medicine & Rehabilitation

## 2021-02-17 DIAGNOSIS — S3210XS Unspecified fracture of sacrum, sequela: Secondary | ICD-10-CM | POA: Diagnosis not present

## 2021-02-17 DIAGNOSIS — M89662 Osteopathy after poliomyelitis, left lower leg: Secondary | ICD-10-CM | POA: Diagnosis not present

## 2021-02-17 DIAGNOSIS — S322XXS Fracture of coccyx, sequela: Secondary | ICD-10-CM | POA: Diagnosis not present

## 2021-02-17 DIAGNOSIS — M545 Low back pain, unspecified: Secondary | ICD-10-CM | POA: Insufficient documentation

## 2021-02-17 DIAGNOSIS — R29898 Other symptoms and signs involving the musculoskeletal system: Secondary | ICD-10-CM | POA: Insufficient documentation

## 2021-02-17 DIAGNOSIS — Z9181 History of falling: Secondary | ICD-10-CM | POA: Diagnosis not present

## 2021-02-17 DIAGNOSIS — B91 Sequelae of poliomyelitis: Secondary | ICD-10-CM | POA: Insufficient documentation

## 2021-02-17 NOTE — Therapy (Signed)
[facility] [address] [place], [place], [address] Phone: [phone]   Fax:  [phone]  Physical Therapy Treatment  Patient Details  Name: Tiffany Hawkins MRN: [id number] Date of Birth: September 24, 1945 Referring Provider (PT): Girtha Hake MD   Encounter Date: 02/17/2021   PT End of Session - 02/17/21 1119     Visit Number 9    Number of Visits 17    Date for PT Re-Evaluation 03/10/21    Authorization - Visit Number 8    Authorization - Number of Visits 10    Progress Note Due on Visit 10    PT Start Time 7169    PT Stop Time 1157    PT Time Calculation (min) 41 min    Equipment Utilized During Treatment Gait belt   4WW and FWW   Activity Tolerance Patient tolerated treatment well   increased unsteadiness after BPPV treatment.   Behavior During Therapy [facility] for tasks assessed/performed             Past Medical History:  Diagnosis Date   Anxiety    Colon cancer ([place]) 10/12/2014   Stage IIB; had chemo   Diabetes mellitus without complication (HCC)    Hyperlipidemia    Hypertension    Lateral epicondylitis    Personal history of chemotherapy    Polio    childhood   Skin cancer    Sqamous Cell, Lt Calf   Sleep apnea    no CPAP, sx improved with wt loss    Past Surgical History:  Procedure Laterality Date   APPENDECTOMY     BACK SURGERY     CATARACT EXTRACTION W/PHACO Left 12/03/2017   Procedure: CATARACT EXTRACTION PHACO AND INTRAOCULAR LENS PLACEMENT ([place]) LEFT  DIABETIC;  Surgeon: Eulogio Bear, MD;  Location: [place];  Service: Ophthalmology;  Laterality: Left;  Diabetic - insulin   CATARACT EXTRACTION W/PHACO Right 01/14/2018   Procedure: CATARACT EXTRACTION PHACO AND INTRAOCULAR LENS PLACEMENT ([facility]) RIGHT DIABETIC;  Surgeon: Eulogio Bear, MD;  Location: [place];  Service: Ophthalmology;  Laterality: Right;  Diabetic - insulin   CERVICAL DISC SURGERY     CHOLECYSTECTOMY      COLON SURGERY     COLONOSCOPY     COLONOSCOPY WITH PROPOFOL N/A 05/17/2015   Procedure: COLONOSCOPY WITH PROPOFOL;  Surgeon: Manya Silvas, MD;  [facility] ENDOSCOPY;  Service: Endoscopy;  Laterality: N/A;   COLONOSCOPY WITH PROPOFOL N/A 04/01/2018   Procedure: COLONOSCOPY WITH BIOPSY;  Surgeon: [name], MD;  Location: [place];  Service: Endoscopy;  Laterality: N/A;  Diabetic - insulin   EMBOLIZATION N/A 01/24/2021   Procedure: EMBOLIZATION;  Surgeon: Algernon Huxley, MD;  Location: [place] [facility];  Service: Cardiovascular;  Laterality: N/A;   ESOPHAGOGASTRODUODENOSCOPY     HERNIA REPAIR     LOWER EXTREMITY VENOGRAPHY Left 01/24/2021   Procedure: LOWER EXTREMITY VENOGRAPHY;  Surgeon: Algernon Huxley, MD;  Location: [place] [facility];  Service: Cardiovascular;  Laterality: Left;   POLYPECTOMY N/A 04/01/2018   Procedure: POLYPECTOMY;  Surgeon: [name], MD;  Location: [place];  Service: Endoscopy;  Laterality: N/A;   PORT A CATH INJECTION ([place] HX)     Port has been removed   TUBAL LIGATION      There were no vitals filed for this visit.   Subjective Assessment - 02/17/21 1117     Subjective Pt reports no falls. Feels like her LBP  is at a 2-3/10 NPS. No complaints or concerns.    Pertinent History per referring note: "Patient notes that she does have a history of chronic back pain which did worsen after her fall. She also endorses a history of polio. Over the past month she feels that she has been more off balance and has had more falls. She is also noticing worsening weakness in her legs. She rates her pain as an 8/10. Is intermittent, stiff. She denies any numbness, tingling or loss of control of bowel or bladder. " H/o Hx cancer, previous back surgery, DM, vertigo, Aortic atherosclerosis ([place]).    Limitations Walking;House hold activities;Sitting;Lifting;Standing    Diagnostic tests MRI of lumbar and cervical spine. See imaging history.     Patient Stated Goals Reduce pain and improve balance.    Currently in Pain? Yes    Pain Score 3     Pain Location Back    Pain Onset 1 to 4 weeks ago             Treatment to date:   Asc/desc 4 stairs with R railing and CGA. X3 to mimic stair case. Step to pattern. With turning at bottom of stairs pt endorses dizziness/unsteadiness due to head turn    BPPV assessment/Treatment:   Screening questions- Positive for endorsing short bouts of unsteadiness/dizziness with head turns (cervical rotation, extension, returning from flexed position to neutral, getting in/out of bed, empyting dishwasher)   Cervical screen: no trauma of cervical spine, hx of cervical fusion pt reports was "years ago". Pain free in all planes, adequate extension and rotation bilat to treat suspected BPPV. Denies visual changes with cervical motion.      Dix Hallpike (R/L): (+) bilat, no nystagmus noted but pt endorses symptoms on R < 5 sec (2/10 scale of severity) and on L < 30 sec (more severe on L at 8/10 scale) 1 min spent in each test position.    Treatment: Epley maneuver bilat with 1 min spent in each position. Unable to reassess Epley as end of treatment session after treatment. Pt overall limited in mobility through test positions with difficulty in side lying and transitioning to sitting EOB. Increase in dizziness returning to upright sitting at EOM table. Required ~2-3 min seated rest for symptoms to subside with CGA provided on shoulders.    Extensive education provided on increased feelings of unsteadiness over the next couple days due to brain needing to rehabituate to maneuvers. Spouse and pt Educated to use RW if needed due to increased unsteadiness to reduce risk of falls. Pt/spouse verbalizing understanding.   PT Education - 02/17/21 1118     Education provided Yes    Education Details form/technique with stairs. BPPV assessment/treatment (dix hallpike, epley), use of AD for safety due to increased  unsteadiness after epley maneuvre. Normal/expected sensation after treating BPPV.    Person(s) Educated Patient    Methods Explanation;Demonstration    Comprehension Verbalized understanding;Returned demonstration                 PT Long Term Goals - 01/13/21 1239       PT LONG TERM GOAL #1   Title Pt will improve MMT scores by 1/2 grade to promote functional strength gains for transfers, amb, and decreased caregiver burden.    Baseline 4/4- Hip flexion  4/4-  Hip abduction  4/4-  Hip adduction  4/3+  Knee extension  4/3+ Knee flexion 4/3+ Ankle dorsiflexion  4/3+ Ankle plantarflexion    Time  8    Period Weeks    Status New    Target Date 03/10/21      PT LONG TERM GOAL #2   Title Pt will improve BERG score by 5 pts to promote safety with ambulation and functional tasks.    Baseline BERG: 18/56    Time 8    Period Weeks    Status New    Target Date 03/10/21      PT LONG TERM GOAL #3   Title Pt will improve FOTO score to predicted improvement value of 56 to measure self reported improvement in functional mobility.    Baseline 36    Time 8    Period Weeks    Status New    Target Date 03/10/21      PT LONG TERM GOAL #4   Title Pt will demonstrates safety with ambulation using FWW with no LOB and only occ verbal cueing and SBA with 10 minutes of overground walking.    Baseline Pt amb 1 min with extensive verbal cueing CGA-min A.    Time 8    Period Weeks    Status New    Target Date 03/10/21      PT LONG TERM GOAL #5   Title Pt will decrease worse pain to 7/10 or less to promote greater access to functional mobility.    Baseline Present 7/10, Best 4/10, Worst 10/10:    Time 8    Period Weeks    Status New    Target Date 03/10/21                   Plan - 02/17/21 1240     Clinical Impression Statement Attempts made to progress stair training. During stair training pt reporting off balance/unsteadiness with turns. Upon further questioning pt has  symptoms consistent with BPPV due to short duration dizziness with changes in head positions such as looking up, looking down then looking up unloading dishwasher, turning head to left and right. Pt extensively educated on further assessment/treatment/expectations, common signs/symptoms of BPPV as this increases pt's risk of falls as she is here for imbalance and LBP. After education on testing positions, normal pt response post testing/treatment if positive, pt given consent to assess and perform. (+) dix hallpike for L post canal. NO noted posterior upbeating nystagmus, but subjective reports of symptoms of unsteadiness. Does not describe spinning sensations. Symptoms less than 30 sec. (+) dix hall pike on R but subjective report less severe and only lasting ~5 sec. Treated with epley maneuver. Pt educated it is common to feel more off balance and can take 24-48 hours to subside as brain has to rehabituate. Educated pt to utilize RW if needed due to reports of increased unsteadiness post treatment. Spouse educated as well for increased support with walking and ADL's due to pt's unsteadiness. If next appt pt reports improvement in imbalance, will benefit from additional session of assessment and treatment if indicated. Pt will continue to benefit from skilled PT services to further address balance impairments to reduce pt's risk of falls.    Personal Factors and Comorbidities Comorbidity 3+;Age;Transportation;Fitness    Comorbidities Hx cancer, previous back surgery, DM, vertigo, Aortic atherosclerosis ([place])    Examination-Activity Limitations Stairs;Reach Overhead;Stand;Dressing;Sit;Transfers;Lift;Caring for Others;Carry;Locomotion Level;Squat    Examination-Participation Restrictions Interpersonal Relationship;Cleaning;Laundry;Yard Work;Driving;Meal Prep;Shop    Stability/Clinical Decision Making Evolving/Moderate complexity    Clinical Decision Making Moderate    Rehab Potential Fair    PT Frequency 2x /  week  PT Duration 8 weeks    PT Treatment/Interventions ADLs/Self Care Home Management;Aquatic Therapy;Neuromuscular re-education;Balance training;Therapeutic exercise;Therapeutic activities;Functional mobility training;Stair training;Gait training;DME Instruction;Patient/family education;Manual techniques;Energy conservation;Biofeedback;Electrical Stimulation;Moist Heat;Traction;Ultrasound;Dry needling;Passive range of motion;Vestibular;Vasopneumatic Device;Spinal Manipulations;Joint Manipulations    PT Next Visit Plan Reassess BPPV, treat if indicated. Progress note.    PT Home Exercise Plan deferred    Consulted and Agree with Plan of Care Patient             Patient will benefit from skilled therapeutic intervention in order to improve the following deficits and impairments:  Abnormal gait, Decreased endurance, Decreased activity tolerance, Decreased strength, Pain, Difficulty walking, Decreased mobility, Decreased balance, Decreased range of motion, Improper body mechanics, Postural dysfunction, Decreased safety awareness, Decreased coordination, Decreased knowledge of precautions, Decreased knowledge of use of DME, Dizziness, Hypomobility, Impaired flexibility, Impaired sensation, Impaired perceived functional ability  Visit Diagnosis: Bilateral low back pain, unspecified chronicity, unspecified whether sciatica present  At high risk for injury related to fall  Closed fracture of sacrum and coccyx, sequela  Bilateral leg weakness  Polio osteopathy of lower leg, left [facility])     Problem List Patient Active Problem List   Diagnosis Date Noted   Aortic atherosclerosis ([place]) 12/29/2020   Deformity of toe of left foot 03/28/2020   H/O multiple pulmonary nodules 09/22/2019   Chronic cough 09/22/2019   Perennial allergic rhinitis 09/17/2019   Acute upper respiratory infection 08/28/2019   Weakness 08/28/2019   Acute bronchitis 06/01/2019   Cough productive of yellow sputum  06/01/2019   Vasomotor rhinitis 05/01/2019   Chronic kidney disease, stage II (mild) 03/14/2019   Gastroesophageal reflux disease without esophagitis 03/14/2019   Intractable episodic headache 03/14/2019   Need for prophylactic vaccination with combined diphtheria-tetanus-pertussis (DTaP) vaccine 03/14/2019   Renal cyst 11/27/2018   Abnormality of gait and mobility 11/27/2018   Hepatic steatosis 10/31/2018   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[place]) 10/31/2018   Elevated ferritin 10/31/2018   Vertigo 08/13/2018   Gastroenteritis 05/09/2018   Diarrhea 05/09/2018   Nausea 05/09/2018   Personal history of colon cancer    Polyp of sigmoid colon    Dysuria 02/23/2018   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 02/10/2018   Encounter for general adult medical examination with abnormal findings 02/10/2018   Acute non-recurrent pansinusitis 08/24/2017   Other headache syndrome 08/24/2017   Uncontrolled type 2 diabetes mellitus with hyperglycemia ([place]) 08/24/2017   Type 2 diabetes mellitus without complication, with long-term current use of insulin ([place]) 08/22/2017   Need for vaccination against Streptococcus pneumoniae using pneumococcal conjugate vaccine 13 08/22/2017   Essential hypertension 05/03/2017   Otalgia, bilateral 05/02/2017   Type 2 diabetes mellitus with hyperglycemia ([place]) 05/02/2017   Mixed hyperlipidemia 05/02/2017   Right lower quadrant pain 06/29/2016   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 Fairly IV, PT, DPT Physical Therapist- [facility]  02/17/2021, 2:42 PM  [facility] [address] [place], [place], [address] Phone: [phone]   Fax:  [phone]  Name: AARIKA MOON MRN: [id number] Date of Birth: [birth year]

## 2021-02-18 DIAGNOSIS — G4733 Obstructive sleep apnea (adult) (pediatric): Secondary | ICD-10-CM | POA: Diagnosis not present

## 2021-02-21 ENCOUNTER — Ambulatory Visit: Payer: Medicare HMO | Admitting: Physical Therapy

## 2021-02-21 ENCOUNTER — Other Ambulatory Visit: Payer: Self-pay

## 2021-02-21 DIAGNOSIS — R29898 Other symptoms and signs involving the musculoskeletal system: Secondary | ICD-10-CM | POA: Diagnosis not present

## 2021-02-21 DIAGNOSIS — Z9181 History of falling: Secondary | ICD-10-CM | POA: Diagnosis not present

## 2021-02-21 DIAGNOSIS — B91 Sequelae of poliomyelitis: Secondary | ICD-10-CM

## 2021-02-21 DIAGNOSIS — S3210XS Unspecified fracture of sacrum, sequela: Secondary | ICD-10-CM

## 2021-02-21 DIAGNOSIS — M545 Low back pain, unspecified: Secondary | ICD-10-CM

## 2021-02-21 DIAGNOSIS — S322XXS Fracture of coccyx, sequela: Secondary | ICD-10-CM

## 2021-02-21 DIAGNOSIS — M89662 Osteopathy after poliomyelitis, left lower leg: Secondary | ICD-10-CM | POA: Diagnosis not present

## 2021-02-22 ENCOUNTER — Encounter: Payer: Medicare HMO | Admitting: Physical Therapy

## 2021-02-22 ENCOUNTER — Encounter: Payer: Self-pay | Admitting: Physical Therapy

## 2021-02-22 ENCOUNTER — Ambulatory Visit (INDEPENDENT_AMBULATORY_CARE_PROVIDER_SITE_OTHER): Payer: Medicare HMO | Admitting: Vascular Surgery

## 2021-02-22 VITALS — BP 131/74 | HR 88 | Ht 62.0 in | Wt 153.0 lb

## 2021-02-22 DIAGNOSIS — E119 Type 2 diabetes mellitus without complications: Secondary | ICD-10-CM

## 2021-02-22 DIAGNOSIS — E782 Mixed hyperlipidemia: Secondary | ICD-10-CM | POA: Diagnosis not present

## 2021-02-22 DIAGNOSIS — I700 Atherosclerosis of aorta: Secondary | ICD-10-CM | POA: Diagnosis not present

## 2021-02-22 DIAGNOSIS — N9489 Other specified conditions associated with female genital organs and menstrual cycle: Secondary | ICD-10-CM | POA: Diagnosis not present

## 2021-02-22 DIAGNOSIS — Z794 Long term (current) use of insulin: Secondary | ICD-10-CM

## 2021-02-22 DIAGNOSIS — I10 Essential (primary) hypertension: Secondary | ICD-10-CM | POA: Diagnosis not present

## 2021-02-22 NOTE — Assessment & Plan Note (Signed)
ABIs were normal when checked.  No current worrisome symptoms.

## 2021-02-22 NOTE — Progress Notes (Signed)
MRN : [id number]  Tiffany Hawkins is a 75 y.o. (01-Feb-1946) female who presents with chief complaint of  Chief Complaint  Patient presents with   Follow-up    Post Op 4 wk no studies  .  History of Present Illness: Patient returns today in follow up of her pelvic congestion syndrome.  She is about a month status post endovascular embolization of pelvic and left gonadal varicosities for pelvic congestion syndrome.  This has resulted in a marked improvement in the pain in her abdomen.  She does still have some pain.  It is not entirely eliminated, but it is much better.  It is less severe and less frequent.  She has noticed some increased flatulence after the procedure.  Her access site is well-healed.  There were no periprocedural complications.  Current Outpatient Medications  Medication Sig Dispense Refill   albuterol (VENTOLIN HFA) 108 (90 Base) MCG/ACT inhaler Inhale 2 puffs into the lungs every 6 (six) hours as needed for wheezing or shortness of breath. 8 g 0   amLODipine (NORVASC) 5 MG tablet TAKE 2 TABLETS DAILY. 180 tablet 1   Blood Glucose Monitoring Suppl (TRUE METRIX METER) w/Device KIT USE AS DIRECTED 1 kit 0   cetirizine (ZYRTEC) 10 MG chewable tablet Chew 1 tablet (10 mg total) by mouth daily. 90 tablet 1   chlorpheniramine-HYDROcodone (TUSSIONEX PENNKINETIC ER) 10-8 MG/5ML SUER Take 5 mLs by mouth at bedtime as needed for cough. 140 mL 0   citalopram (CELEXA) 20 MG tablet TAKE 2 TABLETS DAILY FOR DEPRESSION. 180 tablet 1   Dapagliflozin Propanediol (FARXIGA PO) Take by mouth.     fenofibrate 54 MG tablet Take 1 tablet (54 mg total) by mouth daily. 90 tablet 1   fluticasone (FLONASE) 50 MCG/ACT nasal spray Place 2 sprays into both nostrils daily. 48 g 1   HYDROcodone-acetaminophen (NORCO/VICODIN) 5-325 MG tablet Take by mouth.     ibuprofen (ADVIL) 800 MG tablet Take 1 tablet (800 mg total) by mouth every 8 (eight) hours as needed. 90 tablet 1   insulin aspart protamine -  aspart (NOVOLOG MIX 70/30 FLEXPEN) (70-30) 100 UNIT/ML FlexPen Inject 0.35 mLs (35 Units total) into the skin 2 (two) times daily. Inject 35 units [place] QAM. Gradually increase to 40units [place] QPM 15 mL 11   lovastatin (MEVACOR) 20 MG tablet TAKE 1 TABLET AT BEDTIME FOR HIGH CHOLESTEROL 90 tablet 1   Multiple Vitamin (MULTIVITAMIN) tablet Take by mouth daily. TAKES 1/2 TABLET     pantoprazole (PROTONIX) 20 MG tablet Take 2 tablets (40 mg total) by mouth daily. 90 tablet 1   sharps container 1 each by Does not apply route as needed. To use for insulin syringes and needles. Taking insulin at least twice daily.  E11.65 1 each 5   spironolactone (ALDACTONE) 100 MG tablet Take 100 mg by mouth daily. 2 tablets once a day     TRUE METRIX BLOOD GLUCOSE TEST test strip TEST BLOOD SUGAR THREE TIMES DAILY AFTER MEALS 200 strip 3   TRUEplus Lancets 33G MISC USE AS DIRECTED TO TEST BLOOD SUGAR THREE TIMES DAILY 300 each 3   No current facility-administered medications for this visit.    Past Medical History:  Diagnosis Date   Anxiety    Colon cancer ([place]) 10/12/2014   Stage IIB; had chemo   Diabetes mellitus without complication (HCC)    Hyperlipidemia    Hypertension    Lateral epicondylitis    Personal history of chemotherapy  Polio    childhood   Skin cancer    Sqamous Cell, Lt Calf   Sleep apnea    no CPAP, sx improved with wt loss    Past Surgical History:  Procedure Laterality Date   APPENDECTOMY     BACK SURGERY     CATARACT EXTRACTION W/PHACO Left 12/03/2017   Procedure: CATARACT EXTRACTION PHACO AND INTRAOCULAR LENS PLACEMENT ([place]) LEFT  DIABETIC;  Surgeon: Eulogio Bear, MD;  Location: [place];  Service: Ophthalmology;  Laterality: Left;  Diabetic - insulin   CATARACT EXTRACTION W/PHACO Right 01/14/2018   Procedure: CATARACT EXTRACTION PHACO AND INTRAOCULAR LENS PLACEMENT ([facility]) RIGHT DIABETIC;  Surgeon: Eulogio Bear, MD;  Location: [place];  Service:  Ophthalmology;  Laterality: Right;  Diabetic - insulin   CERVICAL DISC SURGERY     CHOLECYSTECTOMY     COLON SURGERY     COLONOSCOPY     COLONOSCOPY WITH PROPOFOL N/A 05/17/2015   Procedure: COLONOSCOPY WITH PROPOFOL;  Surgeon: Manya Silvas, MD;  Location: [facility] ENDOSCOPY;  Service: Endoscopy;  Laterality: N/A;   COLONOSCOPY WITH PROPOFOL N/A 04/01/2018   Procedure: COLONOSCOPY WITH BIOPSY;  Surgeon: [name], MD;  Location: [place];  Service: Endoscopy;  Laterality: N/A;  Diabetic - insulin   EMBOLIZATION N/A 01/24/2021   Procedure: EMBOLIZATION;  Surgeon: Algernon Huxley, MD;  Location: [place] [facility];  Service: Cardiovascular;  Laterality: N/A;   ESOPHAGOGASTRODUODENOSCOPY     HERNIA REPAIR     LOWER EXTREMITY VENOGRAPHY Left 01/24/2021   Procedure: LOWER EXTREMITY VENOGRAPHY;  Surgeon: Algernon Huxley, MD;  Location: [place] [facility];  Service: Cardiovascular;  Laterality: Left;   POLYPECTOMY N/A 04/01/2018   Procedure: POLYPECTOMY;  Surgeon: [name], MD;  Location: [place];  Service: Endoscopy;  Laterality: N/A;   PORT A CATH INJECTION ([place] HX)     Port has been removed   TUBAL LIGATION       Social History   Tobacco Use   Smoking status: Former    Packs/day: 1.50    Years: 20.00    Pack years: 30.00    Types: Cigarettes    Quit date: 04/17/1981    Years since quitting: 39.8   Smokeless tobacco: Never  Vaping Use   Vaping Use: Never used  Substance Use Topics   Alcohol use: No   Drug use: No       Family History  Problem Relation Age of Onset   Alzheimer's disease Mother    Heart disease Father    Colon cancer Cousin    Colon cancer Cousin    Cancer Cousin        Brain Tumor   Breast cancer Neg Hx      Allergies  Allergen Reactions   Lisinopril Cough     REVIEW OF SYSTEMS (Negative unless checked)  Constitutional: [] Weight loss  [] Fever  [] Chills Cardiac: [] Chest pain   [] Chest pressure   [] Palpitations    [] Shortness of breath when laying flat   [] Shortness of breath at rest   [] Shortness of breath with exertion. Vascular:  [] Pain in legs with walking   [] Pain in legs at rest   [] Pain in legs when laying flat   [] Claudication   [] Pain in feet when walking  [] Pain in feet at rest  [] Pain in feet when laying flat   [] History of DVT   [] Phlebitis   [] Swelling in legs   [] Varicose veins   [] Non-healing ulcers  Pulmonary:   [] Uses home oxygen   [] Productive cough   [] Hemoptysis   [] Wheeze  [] COPD   [] Asthma Neurologic:  [] Dizziness  [] Blackouts   [] Seizures   [] History of stroke   [] History of TIA  [] Aphasia   [] Temporary blindness   [] Dysphagia   [] Weakness or numbness in arms   [] Weakness or numbness in legs Musculoskeletal:  [x] Arthritis   [] Joint swelling   [] Joint pain   [] Low back pain Hematologic:  [] Easy bruising  [] Easy bleeding   [] Hypercoagulable state   [] Anemic   Gastrointestinal:  [] Blood in stool   [] Vomiting blood  [x] Gastroesophageal reflux/heartburn   [x] Abdominal pain Genitourinary:  [] Chronic kidney disease   [] Difficult urination  [] Frequent urination  [] Burning with urination   [] Hematuria Skin:  [] Rashes   [] Ulcers   [] Wounds Psychological:  [x] History of anxiety   []  History of major depression.  Physical Examination  BP 131/74   Pulse 88   Ht 5' 2"  (1.575 m)   Wt 153 lb (69.4 kg)   BMI 27.98 kg/m  Gen:  WD/WN, NAD Head: [place]/AT, No temporalis wasting. Ear/Nose/Throat: Hearing grossly intact, nares w/o erythema or drainage Eyes: Conjunctiva clear. Sclera non-icteric Neck: Supple.  Trachea midline Pulmonary:  Good air movement, no use of accessory muscles.  Cardiac: RRR, no JVD Vascular:  Vessel Right Left  Radial Palpable Palpable                                   Gastrointestinal: soft, non-tender/non-distended. No guarding/reflex.  Musculoskeletal: M/S 5/5 throughout.  No deformity or atrophy.  No edema. Neurologic: Sensation grossly intact in extremities.   Symmetrical.  Speech is fluent.  Psychiatric: Judgment intact, Mood & affect appropriate for pt's clinical situation. Dermatologic: No rashes or ulcers noted.  No cellulitis or open wounds.  Access site is well-healed      Labs Recent Results (from the past 2160 hour(s))  BUN     Status: Abnormal   Collection Time: 01/24/21 11:46 AM  Result Value Ref Range   BUN 40 (H) 8 - 23 mg/dL    Comment: Performed at [facility], [address] [place], [place] [address]  Creatinine, serum     Status: Abnormal   Collection Time: 01/24/21 11:46 AM  Result Value Ref Range   Creatinine, Ser 1.32 (H) 0.44 - 1.00 mg/dL   GFR, Estimated 42 (L) >60 mL/min    Comment: (NOTE) Calculated using the CKD-EPI Creatinine Equation (2021) Performed at [facility], [address] [place], [place] [address]   Glucose, capillary     Status: Abnormal   Collection Time: 01/24/21 11:48 AM  Result Value Ref Range   Glucose-Capillary 162 (H) 70 - 99 mg/dL    Comment: Glucose reference range applies only to samples taken after fasting for at least 8 hours.  POCT HgB A1C     Status: Abnormal   Collection Time: 02/02/21  1:45 PM  Result Value Ref Range   Hemoglobin A1C 7.0 (A) 4.0 - 5.6 %   HbA1c POC (<> result, manual entry)     HbA1c, POC (prediabetic range)     HbA1c, POC (controlled diabetic range)      Radiology PERIPHERAL VASCULAR CATHETERIZATION  Result Date: 01/24/2021 See surgical note for result.   Assessment/Plan Essential hypertension blood pressure control important in reducing the progression of atherosclerotic disease. On appropriate oral medications.     Type 2 diabetes  mellitus without complication, with long-term current use of insulin (HCC) blood glucose control important in reducing the progression of atherosclerotic disease. Also, involved in wound healing. On appropriate medications.     Mixed hyperlipidemia lipid control important in reducing the  progression of atherosclerotic disease. Continue statin therapy  Aortic atherosclerosis (HCC) ABIs were normal when checked.  No current worrisome symptoms.  Female pelvic congestion syndrome Patient is undergone embolization of pelvic varicosities and left gonadal vein for incompetence with pelvic congestion syndrome.  She has noticed significant improvement.  This is encouraging.  We discussed that she may still have some recurrence in terms of other pelvic varicosities that enlarge over time and it is possible she would need a repeat embolization although not likely.  At this point, I will see her back in 6 months and if she is having no further symptoms we can probably see her back as needed for this.    Leotis Pain, MD  02/22/2021 10:52 AM    This note was created with Dragon medical transcription system.  Any errors from dictation are purely unintentional

## 2021-02-22 NOTE — Progress Notes (Signed)
Chronic Care Management Pharmacy Note  03/02/2021 Name:  Tiffany Hawkins MRN:  [id number] DOB:  1945/11/07  Summary: -Will request Amlodipine 47m (2 tabs) be switched to 1 tab of Amlodipine 162m-Will request Citalopram 2064m2 tabs) be switched to 1 tab of Citalopram 64m34mo relieve pill burden) -Will attempt to wean off pantoprazole 20mg90mabs daily by dropping down to 1 tab daily -Will increase time on exercise bike to improve leg stamina and try to regain confidence to walk  -Needs new lipid panel and microalbumin  Recommendations/Changes made from today's visit: -see above -Will pursue PAP for Novolog Mix 70/30 with spousal consent to bring income  Plan: F/U visit with PharmD on 06/22/2021 via telephone   Subjective: Tiffany KUCKn 75 y.75 year old female who is a primary patient of Khan,Humphrey RollsiaTimoteo Gaul  The CCM team was consulted for assistance with disease management and care coordination needs.    Engaged with patient by telephone for initial visit in response to provider referral for pharmacy case management and/or care coordination services.   Consent to Services:  The patient was given the following information about Chronic Care Management services today, agreed to services, and gave verbal consent: 1. CCM service includes personalized support from designated clinical staff supervised by the primary care provider, including individualized plan of care and coordination with other care providers 2. 24/7 contact phone numbers for assistance for urgent and routine care needs. 3. Service will only be billed when office clinical staff spend 20 minutes or more in a month to coordinate care. 4. Only one practitioner may furnish and bill the service in a calendar month. 5.The patient may stop CCM services at any time (effective at the end of the month) by phone call to the office staff. 6. The patient will be responsible for cost sharing (co-pay) of up to 20% of the service fee  (after annual deductible is met). Patient agreed to services and consent obtained.  Patient Care Team: Khan,Lavera Guiseas PCP - General (Internal Medicine) WoodhClayburn Pertas Consulting Physician (General Surgery) CintrHerbert Punas Consulting Physician (GenerSomervell SurgeryelAlena Bills aJacksonville Beach Surgery Center LLCharmacist (Pharmacist)  Recent office visits: 11/01/20 McDonMylinda LatinaC. For follow-up. No medication changes.  09/30/20 AbernJonetta Osgood For follow-up. No medication changes.  08/31/20 AbernJonetta Osgood For follow-up. STARTED TUSSIONEX PENNKINETIC ER 5ml a70medtime PRN.  Recent consult visits: 01/03/21 Physical Medicine and Rehabilitation MoraleGirtha HakeNo medication changes.  12/29/20 Orthopedic Surgery Patel,Posey Prontoy Arlee Muslimundy,Scarlett Prestore information given. 12/29/20 Cardiology Callwood, Dwayne D No more information given.  12/28/20 Vasc Surg Dew, JLucky Cowboy Erskine SquibbFor new patient visit No medication changes.  12/01/20 OBGYN StaeblPurcell MoutonFor pe;vis pain. No medication changes. 11/30/20 Podiatry Baker,Luana ShuewMitzi Hansendication changes.  10/25/20 Dermatology Benitez-Graham, Ana M.Christovalre information given.  10/07/20 Oncology Yu, ZhEarlie ServerFor follow-up. No medication changes.  09/17/20 Ophthalmology King, Eulogio Bearre information given.   Hospital visits: None in previous 6 months   Objective:  Lab Results  Component Value Date   CREATININE 1.32 (H) 01/24/2021   BUN 40 (H) 01/24/2021   GFRNONAA 42 (L) 01/24/2021   GFRAA 56 (L) 05/25/2020   NA 140 10/07/2020   K 4.1 10/07/2020   CALCIUM 9.5 10/07/2020   CO2 27 10/07/2020   GLUCOSE 141 (H) 10/07/2020    Lab Results  Component Value Date/Time  HGBA1C 7.0 (A) 02/02/2021 01:45 PM   HGBA1C 6.3 (A) 08/31/2020 02:00 PM   HGBA1C 8.2 (H) 04/25/2011 10:12 AM   MICROALBUR 113.3 (H) 09/21/2015 11:13 AM   MICROALBUR 30.6 (H) 09/01/2014 02:30 PM    Last diabetic Eye exam: No  results found for: HMDIABEYEEXA  Last diabetic Foot exam: No results found for: HMDIABFOOTEX   Lab Results  Component Value Date   CHOL 168 05/25/2020   HDL 35 (L) 05/25/2020   LDLCALC 90 05/25/2020   TRIG 257 (H) 05/25/2020   CHOLHDL 5.3 03/22/2018    Hepatic Function Latest Ref Rng & Units 10/07/2020 05/25/2020 09/22/2019  Total Protein 6.5 - 8.1 g/dL 7.6 7.3 7.2  Albumin 3.5 - 5.0 g/dL 4.3 4.7 4.1  AST 15 - 41 U/L 21 23 22   ALT 0 - 44 U/L 27 28 29   Alk Phosphatase 38 - 126 U/L 41 51 46  Total Bilirubin 0.3 - 1.2 mg/dL 0.3 0.2 0.6    Lab Results  Component Value Date/Time   TSH 3.010 05/25/2020 08:54 AM   TSH 2.867 08/28/2019 01:35 PM   TSH 2.310 10/03/2018 01:41 PM   FREET4 1.04 05/25/2020 08:54 AM   FREET4 0.76 08/28/2019 01:35 PM    CBC Latest Ref Rng & Units 10/07/2020 05/25/2020 09/22/2019  WBC 4.0 - 10.5 K/uL 7.3 7.4 8.5  Hemoglobin 12.0 - 15.0 g/dL 14.5 15.7 13.8  Hematocrit 36.0 - 46.0 % 44.9 47.3(H) 40.8  Platelets 150 - 400 K/uL 195 220 193    Lab Results  Component Value Date/Time   VD25OH 30.3 05/25/2020 08:54 AM   VD25OH 24.7 (L) 03/22/2018 08:20 AM    Clinical ASCVD: No  The 10-year ASCVD risk score (Arnett DK, et al., 2019) is: 52.5%   Values used to calculate the score:     Age: 64 years     Sex: Female     Is Non-Hispanic African American: No     Diabetic: Yes     Tobacco smoker: Yes     Systolic Blood Pressure: 682 mmHg     Is BP treated: Yes     HDL Cholesterol: 35 mg/dL     Total Cholesterol: 168 mg/dL    Depression screen [facility] 2/9 02/02/2021 11/01/2020 08/31/2020  Decreased Interest 0 0 0  Down, Depressed, Hopeless 0 0 0  PHQ - 2 Score 0 0 0  Some recent data might be hidden       Social History   Tobacco Use  Smoking Status Former   Packs/day: 1.50   Years: 20.00   Pack years: 30.00   Types: Cigarettes   Quit date: 04/17/1981   Years since quitting: 39.9  Smokeless Tobacco Never   BP Readings from Last 3 Encounters:  02/22/21  131/74  02/02/21 122/74  01/24/21 131/74   Pulse Readings from Last 3 Encounters:  02/22/21 88  02/02/21 85  01/24/21 88   Wt Readings from Last 3 Encounters:  02/22/21 153 lb (69.4 kg)  02/02/21 152 lb 3.2 oz (69 kg)  01/24/21 156 lb (70.8 kg)   BMI Readings from Last 3 Encounters:  02/22/21 27.98 kg/m  02/02/21 27.84 kg/m  01/24/21 28.53 kg/m    Assessment/Interventions: Review of patient past medical history, allergies, medications, health status, including review of consultants reports, laboratory and other test data, was performed as part of comprehensive evaluation and provision of chronic care management services.   SDOH:  (Social Determinants of Health) assessments and interventions performed: Yes  SDOH Screenings  Alcohol Screen: Low Risk    Last Alcohol Screening Score (AUDIT): 0  Depression (PHQ2-9): Low Risk    PHQ-2 Score: 0  Financial Resource Strain: Low Risk    Difficulty of Paying Living Expenses: Not very hard  Food Insecurity: Not on file  Housing: Not on file  Physical Activity: Not on file  Social Connections: Not on file  Stress: Not on file  Tobacco Use: Medium Risk   Smoking Tobacco Use: Former   Smokeless Tobacco Use: Never   Passive Exposure: Not on file  Transportation Needs: Not on file    [place]  Allergies  Allergen Reactions   Lisinopril Cough    Medications Reviewed Today     Reviewed by Alena Bills, [facility] (Pharmacist) on 03/02/21 at [place] List Status: <None>   Medication Order Taking? Sig Documenting Provider Last Dose Status Informant  albuterol (VENTOLIN HFA) 108 (90 Base) MCG/ACT inhaler [id number]  Inhale 2 puffs into the lungs every 6 (six) hours as needed for wheezing or shortness of breath. McDonough, Si Gaul, PA-C  Active   amLODipine (NORVASC) 5 MG tablet [id number]  TAKE 2 TABLETS DAILY. Lavera Guise, MD  Active   Blood Glucose Monitoring Suppl (TRUE METRIX METER) w/Device Drucie Opitz [id number]  USE AS  DIRECTED Lavera Guise, MD  Active   cetirizine (ZYRTEC) 10 MG chewable tablet [id number]  Chew 1 tablet (10 mg total) by mouth daily. Ronnell Freshwater, NP  Active   chlorpheniramine-HYDROcodone [facility] ER) 10-8 MG/5ML Latanya Presser [id number]  Take 5 mLs by mouth at bedtime as needed for cough. Lavera Guise, MD  Active   citalopram (CELEXA) 20 MG tablet [id number]  TAKE 2 TABLETS DAILY FOR DEPRESSION. Ronnell Freshwater, NP  Active   Dapagliflozin Propanediol (FARXIGA PO) [id number]  Take by mouth. [provider]  Active   fenofibrate 54 MG tablet [id number]  Take 1 tablet (54 mg total) by mouth daily. Jonetta Osgood, NP  Active   fluticasone (FLONASE) 50 MCG/ACT nasal spray [id number]  Place 2 sprays into both nostrils daily. Ronnell Freshwater, NP  Active   glucose blood (TRUE METRIX BLOOD GLUCOSE TEST) test strip [id number]  TEST BLOOD SUGAR THREE TIMES DAILY AFTER MEALS Abernathy, Alyssa, NP  Active   HYDROcodone-acetaminophen (NORCO/VICODIN) 5-325 MG tablet [id number] No Take by mouth.  Patient not taking: Reported on 03/02/2021   [provider] Not Taking Active   ibuprofen (ADVIL) 800 MG tablet [id number]  Take 1 tablet (800 mg total) by mouth every 8 (eight) hours as needed. Jonetta Osgood, NP  Active   insulin aspart protamine - aspart (NOVOLOG MIX 70/30 FLEXPEN) (70-30) 100 UNIT/ML FlexPen [id number]  Inject 0.35 mLs (35 Units total) into the skin 2 (two) times daily. Inject 35 units [place] QAM. Gradually increase to 40units [place] QPM Ronnell Freshwater, NP  Active   lovastatin (MEVACOR) 20 MG tablet [id number]  TAKE 1 TABLET AT BEDTIME FOR HIGH CHOLESTEROL Lavera Guise, MD  Active   Multiple Vitamin (MULTIVITAMIN) tablet [id number]  Take by mouth daily. TAKES 1/2 TABLET [provider]  Active   pantoprazole (PROTONIX) 20 MG tablet [id number]  Take 2 tablets (40 mg total) by mouth daily. Jonetta Osgood, NP  Active   sharps container [id number]  1 each by Does not  apply route as needed. To use for insulin syringes and needles. Taking insulin at least twice daily.  E11.65 Ronnell Freshwater, NP  Active   spironolactone (ALDACTONE) 100 MG  tablet [id number]  Take 100 mg by mouth daily. 2 tablets once a day [provider]  Active   TRUEplus Lancets 33G MISC [id number]  Use as directed three times a day E11.65 Jonetta Osgood, NP  Active             Patient Active Problem List   Diagnosis Date Noted   Aortic atherosclerosis ([place]) 12/29/2020   Deformity of toe of left foot 03/28/2020   H/O multiple pulmonary nodules 09/22/2019   Chronic cough 09/22/2019   Perennial allergic rhinitis 09/17/2019   Acute upper respiratory infection 08/28/2019   Weakness 08/28/2019   Acute bronchitis 06/01/2019   Cough productive of yellow sputum 06/01/2019   Vasomotor rhinitis 05/01/2019   Chronic kidney disease, stage II (mild) 03/14/2019   Gastroesophageal reflux disease without esophagitis 03/14/2019   Intractable episodic headache 03/14/2019   Need for prophylactic vaccination with combined diphtheria-tetanus-pertussis (DTaP) vaccine 03/14/2019   Renal cyst 11/27/2018   Abnormality of gait and mobility 11/27/2018   Hepatic steatosis 10/31/2018   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[place]) 10/31/2018   Elevated ferritin 10/31/2018   Vertigo 08/13/2018   Gastroenteritis 05/09/2018   Diarrhea 05/09/2018   Nausea 05/09/2018   Personal history of colon cancer    Polyp of sigmoid colon    Dysuria 02/23/2018   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 02/10/2018   Encounter for general adult medical examination with abnormal findings 02/10/2018   Acute non-recurrent pansinusitis 08/24/2017   Other headache syndrome 08/24/2017   Uncontrolled type 2 diabetes mellitus with hyperglycemia ([place]) 08/24/2017   Type 2 diabetes mellitus without complication, with long-term current use of insulin ([place]) 08/22/2017   Need for vaccination against Streptococcus  pneumoniae using pneumococcal conjugate vaccine 13 08/22/2017   Essential hypertension 05/03/2017   Otalgia, bilateral 05/02/2017   Type 2 diabetes mellitus with hyperglycemia ([place]) 05/02/2017   Mixed hyperlipidemia 05/02/2017   Right lower quadrant pain 06/29/2016   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   History of colon cancer 04/13/2015   Cancer of ascending colon ([place]) 10/12/2014    Immunization History  Administered Date(s) Administered   Fluad Quad(high Dose 65+) 02/24/2020   Influenza, High Dose Seasonal PF 01/29/2017, 04/07/2018   Influenza-Unspecified 01/11/2021   PFIZER(Purple Top)SARS-COV-2 Vaccination 06/13/2019, 07/08/2019, 02/24/2020, 01/25/2021    Conditions to be addressed/monitored:  Hypertension, Hyperlipidemia, Diabetes, Depression, and Allergic Rhinitis  Care Plan : [place]  Updates made by Alena Bills, [place] since 03/02/2021 12:00 AM     Problem: HTN, T2MD, HLD, Mood, Allergies   Priority: High     Long-Range Goal: Disease State Mgmt   Start Date: 03/02/2021  Expected End Date: 03/02/2022  This Visit's Progress: On track  Priority: High  Note:   Current Barriers:  Unable to independently afford treatment regimen  Pharmacist Clinical Goal(s):  Patient will verbalize ability to afford treatment regimen through collaboration with PharmD and provider.   Interventions: 1:1 collaboration with Lavera Guise, MD regarding development and update of comprehensive plan of care as evidenced by provider attestation and co-signature Inter-disciplinary care team collaboration (see longitudinal plan of care) Comprehensive medication review performed; medication list updated in electronic medical record  Hypertension (BP goal <130/80) -Controlled -Current treatment: Amlodipine 84m 2 tabs daily Spironolactone 1052m2 tabs daily -Medications previously tried: Bisoprolol-HCTZ, Lisinopril (allergic)  -Current  home readings: 02/28/21: 125/79; 02/23/21: 136/79 -Current dietary habits: See diabetes management -Current exercise habits: has a sitting bike that  she uses about 15 min per day -Denies hypotensive/hypertensive symptoms -Educated on BP goals and benefits of medications for prevention of heart attack, stroke and kidney damage; Importance of home blood pressure monitoring; -Counseled to monitor BP at home at least twice per week, document, and provide log at future appointments -Counseled on diet and exercise extensively Recommended to continue current medication -Patient discussed trouble handling 2 pills for amlodipine, would like rx to be switched to amlodipine 44m tablets to just take 1 tablet per dose. Will discuss with provider.  Hyperlipidemia: (LDL goal < 70) -Not ideally controlled -Current treatment: Lovastatin 264mat bedtime Fenofibrate 5458maily -Medications previously tried: None noted  -Current dietary patterns: see diabetes -Current exercise habits: see above -Educated on Cholesterol goals;  Benefits of statin for ASCVD risk reduction; Importance of limiting foods high in cholesterol; -Counseled on diet and exercise extensively Recommended to continue current medication -Will try to increase time on exercise bike to increase leg stamina, exercise time, and gain confidence in movement  Diabetes (A1c goal <7%) -Controlled -Current medications: Farxiga 74m81mily Novolog mix 70/30 35 units every morning and 40 units every evening -Medications previously tried: Invokana, Glipizide, Metformin, Victoza, Ozempic  -Current home glucose readings fasting glucose: 175-250 post prandial glucose: no readings -Denies hypoglycemic/hyperglycemic symptoms -Current meal patterns:  breakfast: eggs, bacon, toast  lunch: salad, dinner leftovers  dinner: meat and veggies, biscuits snacks: cookie, banana bread drinks: water, coffee -Current exercise: see above -Educated on A1c and  blood sugar goals; Benefits of routine self-monitoring of blood sugar; -Counseled to check feet daily and get yearly eye exams -Recommended to continue current medication Assessed patient finances. Will try to pursue Novolog Mix PAP with spousal consent  Depression/Anxiety (Goal: well-controlled mood) -Controlled -Current treatment: Citalopram 20mg61mabs daily -Medications previously tried/failed: Lexapro, xanax -Overall very cheerful demeanor and pleasure to speak with -Expressed interest in switching citalopram to 40mg 38mto get her full dose, to avoid having to take so many pills -Will consult with provider to switch rx to 40mg t11mRecommended to continue current medication  Allergic Rhinitis (Goal: managed allergy symptoms) -Controlled -Current treatment  Zyrtec 74mg ch75mle Albuterol HFA Tussionex Flonase -Recommended to continue current medication  GERD (Goal: limited acid relfex) -Controlled -Current treatment  Pantoprazole 20mg 2 t8mdaily -Medications previously tried: None noted  -Counseled on diet and exercise extensively Educated on risk of developing weak bones with continued use of pantoprazole. Patient expressed interest in trying to wean off medication and control future reflux flares with OTC famotidine. Educated patient to avoid eating right before bed and to recognize trigger foods. Patient will drop down to pantoprazole 20mg 1 ta68mily for next week to see how that works, if goes well, will drop down to 1 tab every other day, then stop    Patient Goals/Self-Care Activities Patient will:  - take medications as prescribed as evidenced by patient report and record review check glucose once a day, document, and provide at future appointments check blood pressure 2-3 times per week, document, and provide at future appointments -Will attempt to increase exercise time on bike by 5 min each week to get back up to her usual hour on bike  Follow Up Plan:  Telephone follow up appointment with care management team member scheduled for: 06/22/2021       Medication Assistance:  Patient states has issue paying for Novolog Mx 70/30 and is buying the $25 vials (4 at a time) from [facility] [place].  Totals over $100 per month. Will speak to husband about bringing in proof of income to pursue PAP.  Compliance/Adherence/Medication fill history: Care Gaps: Urine microalbumin Pneumonia, shingles vaccine  Star-Rating Drugs: Lovastatin 24m 90DS 10/13/20  [facility]:  [id number] [address]Phone: [phone]: [phone] [place] [place]Phone: [phone]: [phone] [place] [address]Phone: [phone]: [phone] Uses pill box? Yes Pt endorses 100% compliance  We discussed: Current pharmacy is preferred with insurance plan and patient is satisfied with pharmacy services Patient decided to: Continue current medication management strategy  Care Plan and Follow Up Patient Decision:  Patient agrees to Care Plan and Follow-up.  Plan: Telephone follow up appointment with care management team member scheduled for:  06/22/2021      AAlena BillsClinical Pharmacist [phone]

## 2021-02-22 NOTE — Assessment & Plan Note (Signed)
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  She has noticed significant improvement.  This is encouraging.  We discussed that she may still have some recurrence in terms of other pelvic varicosities that enlarge over time and it is possible she would need a repeat embolization although not likely.  At this point, I will see her back in 6 months and if she is having no further symptoms we can probably see her back as needed for this.

## 2021-02-23 NOTE — Therapy (Signed)
[facility] [address] [place], [place], [address] Phone: [phone]   Fax:  [phone]  Physical Therapy Treatment Physical Therapy Progress Note   Dates of reporting period  01/13/2021 to 02/21/2021  Patient Details  Name: Tiffany Hawkins MRN: [id number] Date of Birth: July 25, 1945 Referring Provider (PT): Girtha Hake MD   Encounter Date: 02/21/2021   PT End of Session - 02/22/21 1457     Visit Number 10    Number of Visits 17    Date for PT Re-Evaluation 03/10/21    Authorization - Visit Number 10    Authorization - Number of Visits 10    Progress Note Due on Visit 10    PT Start Time 1112    PT Stop Time 1202    PT Time Calculation (min) 50 min    Equipment Utilized During Treatment Gait belt   4WW and FWW   Activity Tolerance Patient tolerated treatment well   increased unsteadiness after BPPV treatment.   Behavior During Therapy [facility] for tasks assessed/performed             Past Medical History:  Diagnosis Date   Anxiety    Colon cancer ([place]) 10/12/2014   Stage IIB; had chemo   Diabetes mellitus without complication (HCC)    Hyperlipidemia    Hypertension    Lateral epicondylitis    Personal history of chemotherapy    Polio    childhood   Skin cancer    Sqamous Cell, Lt Calf   Sleep apnea    no CPAP, sx improved with wt loss    Past Surgical History:  Procedure Laterality Date   APPENDECTOMY     BACK SURGERY     CATARACT EXTRACTION W/PHACO Left 12/03/2017   Procedure: CATARACT EXTRACTION PHACO AND INTRAOCULAR LENS PLACEMENT ([place]) LEFT  DIABETIC;  Surgeon: Eulogio Bear, MD;  Location: [place];  Service: Ophthalmology;  Laterality: Left;  Diabetic - insulin   CATARACT EXTRACTION W/PHACO Right 01/14/2018   Procedure: CATARACT EXTRACTION PHACO AND INTRAOCULAR LENS PLACEMENT ([facility]) RIGHT DIABETIC;  Surgeon: Eulogio Bear, MD;  Location: [place];  Service: Ophthalmology;   Laterality: Right;  Diabetic - insulin   CERVICAL DISC SURGERY     CHOLECYSTECTOMY     COLON SURGERY     COLONOSCOPY     COLONOSCOPY WITH PROPOFOL N/A 05/17/2015   Procedure: COLONOSCOPY WITH PROPOFOL;  Surgeon: Manya Silvas, MD;  Location: [facility] ENDOSCOPY;  Service: Endoscopy;  Laterality: N/A;   COLONOSCOPY WITH PROPOFOL N/A 04/01/2018   Procedure: COLONOSCOPY WITH BIOPSY;  Surgeon: [name], MD;  Location: [place];  Service: Endoscopy;  Laterality: N/A;  Diabetic - insulin   EMBOLIZATION N/A 01/24/2021   Procedure: EMBOLIZATION;  Surgeon: Algernon Huxley, MD;  Location: [place] CV LAB;  Service: Cardiovascular;  Laterality: N/A;   ESOPHAGOGASTRODUODENOSCOPY     HERNIA REPAIR     LOWER EXTREMITY VENOGRAPHY Left 01/24/2021   Procedure: LOWER EXTREMITY VENOGRAPHY;  Surgeon: Algernon Huxley, MD;  Location: [place] CV LAB;  Service: Cardiovascular;  Laterality: Left;   POLYPECTOMY N/A 04/01/2018   Procedure: POLYPECTOMY;  Surgeon: [name], MD;  Location: [place];  Service: Endoscopy;  Laterality: N/A;   PORT A CATH INJECTION ([place] HX)     Port has been removed   TUBAL LIGATION      There were no vitals filed for this visit.   Subjective Assessment - 02/22/21  1018     Subjective Pt. attended church in person this past Sunday for the 1st time in a while.  Pt. requires assist from husband to safety enter/exit church.    Pertinent History per referring note: "Patient notes that she does have a history of chronic back pain which did worsen after her fall. She also endorses a history of polio. Over the past month she feels that she has been more off balance and has had more falls. She is also noticing worsening weakness in her legs. She rates her pain as an 8/10. Is intermittent, stiff. She denies any numbness, tingling or loss of control of bowel or bladder. " H/o Hx cancer, previous back surgery, DM, vertigo, Aortic atherosclerosis ([place]).    Limitations  Walking;House hold activities;Sitting;Lifting;Standing    Diagnostic tests MRI of lumbar and cervical spine. See imaging history.    Patient Stated Goals Reduce pain and improve balance.    Currently in Pain? Yes    Pain Score 3     Pain Location Back    Pain Onset 1 to 4 weeks ago              Ther.ex.:   Nustep L3-4 B UE/LE (warm-up)- discussed stairs/ walking into church and attending wedding.     Standing hip ex. (All planes)- 3# all planes.  Seated 3# LAQ/ heel raises/ step ups 10x2 each.       Neuro.mm.:   Walking in //-bars with high marching/ lateral walking with light to no UE assist 3x each.     Stair training with SBA/CGA to ascend with recip. Pattern and descend with step to pattern (use of 1 handrail/ discussion of SPC).  Stairs outside with increase hip flexion required.    Star ex.: cone touch/ varying colors  Walking outside on varying terrain/ parking lot/ grass/ curbs.        PT Long Term Goals - 02/22/21 1458       PT LONG TERM GOAL #1   Title Pt will improve MMT scores by 1/2 grade to promote functional strength gains for transfers, amb, and decreased caregiver burden.    Baseline 4/4- Hip flexion  4/4-  Hip abduction  4/4-  Hip adduction  4/3+  Knee extension  4/3+ Knee flexion 4/3+ Ankle dorsiflexion  4/3+ Ankle plantarflexion    Time 8    Period Weeks    Status On-going    Target Date 03/10/21      PT LONG TERM GOAL #2   Title Pt will improve BERG score by 5 pts to promote safety with ambulation and functional tasks.    Baseline BERG: 18/56    Time 8    Period Weeks    Status On-going    Target Date 03/10/21      PT LONG TERM GOAL #3   Title Pt will improve FOTO score to predicted improvement value of 56 to measure self reported improvement in functional mobility.    Baseline Initial eval: 36.   11/7: 54 (decrease in back pain but limitations in safe ambulation).    Time 8    Period Weeks    Status Partially Met    Target Date  03/10/21      PT LONG TERM GOAL #4   Title Pt will demonstrates safety with ambulation using FWW with no LOB and only occ verbal cueing and SBA with 10 minutes of overground walking.    Baseline Pt amb 1 min with extensive verbal cueing  CGA-min A.    Time 8    Period Weeks    Status Not Met    Target Date 03/10/21      PT LONG TERM GOAL #5   Title Pt will decrease worse pain to 7/10 or less to promote greater access to functional mobility.    Baseline Present 7/10, Best 4/10, Worst 10/10:    Time 8    Period Weeks    Status Partially Met    Target Date 03/10/21                   Plan - 02/22/21 1458     Clinical Impression Statement Pt. challenged with dynamic balance tasks in PT and walking tasks on outside/ uneven terrain.  Pt. benefits from use of RW or SPC with SBA/CGA for safety.  Pt. is hesitant with use of RW at this time and has been relying on husband/ [facility] for balance with walking/ stairs.  Pt. requires CGA to safety ascend/ descend 8 stairs with use of 1 handrail and step to pattern with proper technique.  No recent falls and pt. will continue to benefit from LE strengthening ex. program and focus on dynamic balance/ gait tasks to improve safety/ prevent falls.  See updated goals.    Personal Factors and Comorbidities Comorbidity 3+;Age;Transportation;Fitness    Comorbidities Hx cancer, previous back surgery, DM, vertigo, Aortic atherosclerosis ([place])    Examination-Activity Limitations Stairs;Reach Overhead;Stand;Dressing;Sit;Transfers;Lift;Caring for Others;Carry;Locomotion Level;Squat    Examination-Participation Restrictions Interpersonal Relationship;Cleaning;Laundry;Yard Work;Driving;Meal Prep;Shop    Stability/Clinical Decision Making Evolving/Moderate complexity    Clinical Decision Making Moderate    Rehab Potential Fair    PT Frequency 2x / week    PT Duration 8 weeks    PT Treatment/Interventions ADLs/Self Care Home Management;Aquatic Therapy;Neuromuscular  re-education;Balance training;Therapeutic exercise;Therapeutic activities;Functional mobility training;Stair training;Gait training;DME Instruction;Patient/family education;Manual techniques;Energy conservation;Biofeedback;Electrical Stimulation;Moist Heat;Traction;Ultrasound;Dry needling;Passive range of motion;Vestibular;Vasopneumatic Device;Spinal Manipulations;Joint Manipulations    PT Next Visit Plan Reassess BPPV, treat if indicated.    PT Home Exercise Plan deferred    Consulted and Agree with Plan of Care Patient             Patient will benefit from skilled therapeutic intervention in order to improve the following deficits and impairments:  Abnormal gait, Decreased endurance, Decreased activity tolerance, Decreased strength, Pain, Difficulty walking, Decreased mobility, Decreased balance, Decreased range of motion, Improper body mechanics, Postural dysfunction, Decreased safety awareness, Decreased coordination, Decreased knowledge of precautions, Decreased knowledge of use of DME, Dizziness, Hypomobility, Impaired flexibility, Impaired sensation, Impaired perceived functional ability  Visit Diagnosis: Bilateral low back pain, unspecified chronicity, unspecified whether sciatica present  At high risk for injury related to fall  Closed fracture of sacrum and coccyx, sequela  Bilateral leg weakness  Polio osteopathy of lower leg, left [facility])     Problem List Patient Active Problem List   Diagnosis Date Noted   Aortic atherosclerosis ([place]) 12/29/2020   Deformity of toe of left foot 03/28/2020   H/O multiple pulmonary nodules 09/22/2019   Chronic cough 09/22/2019   Perennial allergic rhinitis 09/17/2019   Acute upper respiratory infection 08/28/2019   Weakness 08/28/2019   Acute bronchitis 06/01/2019   Cough productive of yellow sputum 06/01/2019   Vasomotor rhinitis 05/01/2019   Chronic kidney disease, stage II (mild) 03/14/2019   Gastroesophageal reflux disease without  esophagitis 03/14/2019   Intractable episodic headache 03/14/2019   Need for prophylactic vaccination with combined diphtheria-tetanus-pertussis (DTaP) vaccine 03/14/2019   Renal cyst 11/27/2018  Abnormality of gait and mobility 11/27/2018   Hepatic steatosis 10/31/2018   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[place]) 10/31/2018   Elevated ferritin 10/31/2018   Vertigo 08/13/2018   Gastroenteritis 05/09/2018   Diarrhea 05/09/2018   Nausea 05/09/2018   Personal history of colon cancer    Polyp of sigmoid colon    Dysuria 02/23/2018   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 02/10/2018   Encounter for general adult medical examination with abnormal findings 02/10/2018   Acute non-recurrent pansinusitis 08/24/2017   Other headache syndrome 08/24/2017   Uncontrolled type 2 diabetes mellitus with hyperglycemia ([place]) 08/24/2017   Type 2 diabetes mellitus without complication, with long-term current use of insulin ([place]) 08/22/2017   Need for vaccination against Streptococcus pneumoniae using pneumococcal conjugate vaccine 13 08/22/2017   Essential hypertension 05/03/2017   Otalgia, bilateral 05/02/2017   Type 2 diabetes mellitus with hyperglycemia ([place]) 05/02/2017   Mixed hyperlipidemia 05/02/2017   Right lower quadrant pain 06/29/2016   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   History of colon cancer 04/13/2015   Cancer of ascending colon ([place]) 10/12/2014   Pura Spice, PT, DPT # [id number] 02/23/2021, 9:00 AM  [place] [facility] [address] [place], [place], [address] Phone: [phone]   Fax:  [phone]  Name: REIGHLYN ELMES MRN: [id number] Date of Birth: Aug 04, 1945

## 2021-02-24 ENCOUNTER — Encounter: Payer: Medicare HMO | Admitting: Physical Therapy

## 2021-02-24 ENCOUNTER — Ambulatory Visit: Payer: Medicare HMO | Admitting: Physical Therapy

## 2021-02-24 ENCOUNTER — Other Ambulatory Visit: Payer: Self-pay

## 2021-02-24 DIAGNOSIS — Z9181 History of falling: Secondary | ICD-10-CM | POA: Diagnosis not present

## 2021-02-24 DIAGNOSIS — M545 Low back pain, unspecified: Secondary | ICD-10-CM | POA: Diagnosis not present

## 2021-02-24 DIAGNOSIS — S322XXS Fracture of coccyx, sequela: Secondary | ICD-10-CM | POA: Diagnosis not present

## 2021-02-24 DIAGNOSIS — R29898 Other symptoms and signs involving the musculoskeletal system: Secondary | ICD-10-CM | POA: Diagnosis not present

## 2021-02-24 DIAGNOSIS — B91 Sequelae of poliomyelitis: Secondary | ICD-10-CM

## 2021-02-24 DIAGNOSIS — S3210XS Unspecified fracture of sacrum, sequela: Secondary | ICD-10-CM

## 2021-02-24 DIAGNOSIS — M89662 Osteopathy after poliomyelitis, left lower leg: Secondary | ICD-10-CM | POA: Diagnosis not present

## 2021-02-28 ENCOUNTER — Telehealth: Payer: Self-pay | Admitting: Student-PharmD

## 2021-02-28 ENCOUNTER — Ambulatory Visit: Payer: Medicare HMO | Admitting: Physical Therapy

## 2021-02-28 ENCOUNTER — Other Ambulatory Visit: Payer: Self-pay

## 2021-02-28 ENCOUNTER — Encounter: Payer: Self-pay | Admitting: Physical Therapy

## 2021-02-28 DIAGNOSIS — M545 Low back pain, unspecified: Secondary | ICD-10-CM

## 2021-02-28 DIAGNOSIS — Z9181 History of falling: Secondary | ICD-10-CM

## 2021-02-28 DIAGNOSIS — I700 Atherosclerosis of aorta: Secondary | ICD-10-CM

## 2021-02-28 DIAGNOSIS — M89662 Osteopathy after poliomyelitis, left lower leg: Secondary | ICD-10-CM

## 2021-02-28 DIAGNOSIS — S322XXS Fracture of coccyx, sequela: Secondary | ICD-10-CM | POA: Diagnosis not present

## 2021-02-28 DIAGNOSIS — R29898 Other symptoms and signs involving the musculoskeletal system: Secondary | ICD-10-CM

## 2021-02-28 DIAGNOSIS — S3210XS Unspecified fracture of sacrum, sequela: Secondary | ICD-10-CM | POA: Diagnosis not present

## 2021-02-28 DIAGNOSIS — B91 Sequelae of poliomyelitis: Secondary | ICD-10-CM | POA: Diagnosis not present

## 2021-02-28 MED ORDER — FENOFIBRATE 54 MG PO TABS: 54.0000 mg | ORAL_TABLET | Freq: Every day | ORAL | 1 refills | Status: DC

## 2021-02-28 NOTE — Therapy (Signed)
[place] [facility] [address] [place], [place], [address] Phone: [phone]   Fax:  [phone]  Physical Therapy Treatment  Patient Details  Name: Tiffany Hawkins MRN: [id number] Date of Birth: 09/11/1945 Referring Provider (PT): Girtha Hake MD   Encounter Date: 02/24/2021   PT End of Session - 02/28/21 0934     Visit Number 11    Number of Visits 17    Date for PT Re-Evaluation 03/10/21    Authorization - Visit Number 1    Authorization - Number of Visits 10    Progress Note Due on Visit 10    PT Start Time 3419    PT Stop Time 1158    PT Time Calculation (min) 47 min    Equipment Utilized During Treatment Gait belt   4WW and FWW   Activity Tolerance Patient tolerated treatment well;Patient limited by fatigue   increased unsteadiness after BPPV treatment.   Behavior During Therapy [facility] for tasks assessed/performed             Past Medical History:  Diagnosis Date   Anxiety    Colon cancer ([place]) 10/12/2014   Stage IIB; had chemo   Diabetes mellitus without complication (HCC)    Hyperlipidemia    Hypertension    Lateral epicondylitis    Personal history of chemotherapy    Polio    childhood   Skin cancer    Sqamous Cell, Lt Calf   Sleep apnea    no CPAP, sx improved with wt loss    Past Surgical History:  Procedure Laterality Date   APPENDECTOMY     BACK SURGERY     CATARACT EXTRACTION W/PHACO Left 12/03/2017   Procedure: CATARACT EXTRACTION PHACO AND INTRAOCULAR LENS PLACEMENT ([place]) LEFT  DIABETIC;  Surgeon: Eulogio Bear, MD;  Location: [place];  Service: Ophthalmology;  Laterality: Left;  Diabetic - insulin   CATARACT EXTRACTION W/PHACO Right 01/14/2018   Procedure: CATARACT EXTRACTION PHACO AND INTRAOCULAR LENS PLACEMENT ([facility]) RIGHT DIABETIC;  Surgeon: Eulogio Bear, MD;  Location: [place];  Service: Ophthalmology;  Laterality: Right;  Diabetic - insulin   CERVICAL DISC  SURGERY     CHOLECYSTECTOMY     COLON SURGERY     COLONOSCOPY     COLONOSCOPY WITH PROPOFOL N/A 05/17/2015   Procedure: COLONOSCOPY WITH PROPOFOL;  Surgeon: Manya Silvas, MD;  Location: [facility] ENDOSCOPY;  Service: Endoscopy;  Laterality: N/A;   COLONOSCOPY WITH PROPOFOL N/A 04/01/2018   Procedure: COLONOSCOPY WITH BIOPSY;  Surgeon: [name], MD;  Location: [place];  Service: Endoscopy;  Laterality: N/A;  Diabetic - insulin   EMBOLIZATION N/A 01/24/2021   Procedure: EMBOLIZATION;  Surgeon: Algernon Huxley, MD;  Location: [place] CV LAB;  Service: Cardiovascular;  Laterality: N/A;   ESOPHAGOGASTRODUODENOSCOPY     HERNIA REPAIR     LOWER EXTREMITY VENOGRAPHY Left 01/24/2021   Procedure: LOWER EXTREMITY VENOGRAPHY;  Surgeon: Algernon Huxley, MD;  Location: [place] CV LAB;  Service: Cardiovascular;  Laterality: Left;   POLYPECTOMY N/A 04/01/2018   Procedure: POLYPECTOMY;  Surgeon: [name], MD;  Location: [place];  Service: Endoscopy;  Laterality: N/A;   PORT A CATH INJECTION ([place] HX)     Port has been removed   TUBAL LIGATION      There were no vitals filed for this visit.   Subjective Assessment - 02/28/21 0933     Subjective No new complaints.  No  falls.  Pt. entered PT with no assistive device and use of walls to check balance.    Pertinent History per referring note: "Patient notes that she does have a history of chronic back pain which did worsen after her fall. She also endorses a history of polio. Over the past month she feels that she has been more off balance and has had more falls. She is also noticing worsening weakness in her legs. She rates her pain as an 8/10. Is intermittent, stiff. She denies any numbness, tingling or loss of control of bowel or bladder. " H/o Hx cancer, previous back surgery, DM, vertigo, Aortic atherosclerosis ([place]).    Limitations Walking;House hold activities;Sitting;Lifting;Standing    Diagnostic tests MRI of lumbar  and cervical spine. See imaging history.    Patient Stated Goals Reduce pain and improve balance.    Currently in Pain? Yes    Pain Score 3     Pain Location Back    Pain Orientation Left;Lower    Pain Onset 1 to 4 weeks ago                Ther.ex.:   Nustep L4 B UE/LE (warm-up)- reassessed HEP/ daily walking/ household activities.    Standing hip ex. (All planes)- 3# all planes.  Seated 3# LAQ/ heel raises/ step ups 10x2 each.       Neuro.mm.:   Walking in //-bars with high marching/ lateral walking with light to no UE assist 3x each.     Resisted gait: 2BTB 5x all 4-planes.  CGA and light UE assist in //-bars.   Stair training with SBA/CGA to ascend with recip. Pattern and descend with step to pattern (use of 1 handrail/ discussion of SPC).     Recip. Step touches (heel/ toe) 10x.    Walking outside on varying terrain/ parking lot/ grass/ curbs.        PT Long Term Goals - 02/22/21 1458       PT LONG TERM GOAL #1   Title Pt will improve MMT scores by 1/2 grade to promote functional strength gains for transfers, amb, and decreased caregiver burden.    Baseline 4/4- Hip flexion  4/4-  Hip abduction  4/4-  Hip adduction  4/3+  Knee extension  4/3+ Knee flexion 4/3+ Ankle dorsiflexion  4/3+ Ankle plantarflexion    Time 8    Period Weeks    Status On-going    Target Date 03/10/21      PT LONG TERM GOAL #2   Title Pt will improve BERG score by 5 pts to promote safety with ambulation and functional tasks.    Baseline BERG: 18/56    Time 8    Period Weeks    Status On-going    Target Date 03/10/21      PT LONG TERM GOAL #3   Title Pt will improve FOTO score to predicted improvement value of 56 to measure self reported improvement in functional mobility.    Baseline Initial eval: 36.   11/7: 54 (decrease in back pain but limitations in safe ambulation).    Time 8    Period Weeks    Status Partially Met    Target Date 03/10/21      PT LONG TERM GOAL #4    Title Pt will demonstrates safety with ambulation using FWW with no LOB and only occ verbal cueing and SBA with 10 minutes of overground walking.    Baseline Pt amb 1 min with extensive verbal  cueing CGA-min A.    Time 8    Period Weeks    Status Not Met    Target Date 03/10/21      PT LONG TERM GOAL #5   Title Pt will decrease worse pain to 7/10 or less to promote greater access to functional mobility.    Baseline Present 7/10, Best 4/10, Worst 10/10:    Time 8    Period Weeks    Status Partially Met    Target Date 03/10/21                   Plan - 02/28/21 0935     Clinical Impression Statement Pt. had several episodes of scissoring gait during hallway walking without assistive device.  Pt. requires CGA for safety with dynamic balance/ walking ex. in clinic.  Pt. will benefit from use of RW for safer gait pattern at home but is resistant and relies on husband.  Ascends/ descends 8 stairs with use of 1 handrail and step to gait pattern.  Pt. able to ascends stairs with recip. pattern but extra caution/ support.  Pt. able to self-correct 2 episodes of LOB during tx. today.    Personal Factors and Comorbidities Comorbidity 3+;Age;Transportation;Fitness    Comorbidities Hx cancer, previous back surgery, DM, vertigo, Aortic atherosclerosis ([place])    Examination-Activity Limitations Stairs;Reach Overhead;Stand;Dressing;Sit;Transfers;Lift;Caring for Others;Carry;Locomotion Level;Squat    Examination-Participation Restrictions Interpersonal Relationship;Cleaning;Laundry;Yard Work;Driving;Meal Prep;Shop    Stability/Clinical Decision Making Evolving/Moderate complexity    Clinical Decision Making Moderate    Rehab Potential Fair    PT Frequency 2x / week    PT Duration 8 weeks    PT Treatment/Interventions ADLs/Self Care Home Management;Aquatic Therapy;Neuromuscular re-education;Balance training;Therapeutic exercise;Therapeutic activities;Functional mobility training;Stair training;Gait  training;DME Instruction;Patient/family education;Manual techniques;Energy conservation;Biofeedback;Electrical Stimulation;Moist Heat;Traction;Ultrasound;Dry needling;Passive range of motion;Vestibular;Vasopneumatic Device;Spinal Manipulations;Joint Manipulations    PT Next Visit Plan Reassess BPPV, treat if indicated.    PT Home Exercise Plan deferred    Consulted and Agree with Plan of Care Patient             Patient will benefit from skilled therapeutic intervention in order to improve the following deficits and impairments:  Abnormal gait, Decreased endurance, Decreased activity tolerance, Decreased strength, Pain, Difficulty walking, Decreased mobility, Decreased balance, Decreased range of motion, Improper body mechanics, Postural dysfunction, Decreased safety awareness, Decreased coordination, Decreased knowledge of precautions, Decreased knowledge of use of DME, Dizziness, Hypomobility, Impaired flexibility, Impaired sensation, Impaired perceived functional ability  Visit Diagnosis: Bilateral low back pain, unspecified chronicity, unspecified whether sciatica present  At high risk for injury related to fall  Closed fracture of sacrum and coccyx, sequela  Bilateral leg weakness  Polio osteopathy of lower leg, left ([place])     Problem List Patient Active Problem List   Diagnosis Date Noted   Aortic atherosclerosis ([place]) 12/29/2020   Deformity of toe of left foot 03/28/2020   H/O multiple pulmonary nodules 09/22/2019   Chronic cough 09/22/2019   Perennial allergic rhinitis 09/17/2019   Acute upper respiratory infection 08/28/2019   Weakness 08/28/2019   Acute bronchitis 06/01/2019   Cough productive of yellow sputum 06/01/2019   Vasomotor rhinitis 05/01/2019   Chronic kidney disease, stage II (mild) 03/14/2019   Gastroesophageal reflux disease without esophagitis 03/14/2019   Intractable episodic headache 03/14/2019   Need for prophylactic vaccination with combined  diphtheria-tetanus-pertussis (DTaP) vaccine 03/14/2019   Renal cyst 11/27/2018   Abnormality of gait and mobility 11/27/2018   Hepatic steatosis 10/31/2018   Episode of moderate  major depression ([place]) 10/31/2018   Elevated ferritin 10/31/2018   Vertigo 08/13/2018   Gastroenteritis 05/09/2018   Diarrhea 05/09/2018   Nausea 05/09/2018   Personal history of colon cancer    Polyp of sigmoid colon    Dysuria 02/23/2018   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 02/10/2018   Encounter for general adult medical examination with abnormal findings 02/10/2018   Acute non-recurrent pansinusitis 08/24/2017   Other headache syndrome 08/24/2017   Uncontrolled type 2 diabetes mellitus with hyperglycemia ([place]) 08/24/2017   Type 2 diabetes mellitus without complication, with long-term current use of insulin ([place]) 08/22/2017   Need for vaccination against Streptococcus pneumoniae using pneumococcal conjugate vaccine 13 08/22/2017   Essential hypertension 05/03/2017   Otalgia, bilateral 05/02/2017   Type 2 diabetes mellitus with hyperglycemia ([place]) 05/02/2017   Mixed hyperlipidemia 05/02/2017   Right lower quadrant pain 06/29/2016   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   History of colon cancer 04/13/2015   Cancer of ascending colon ([place]) 10/12/2014   Pura Spice, PT, DPT # [id number] 02/28/2021, 9:56 AM  [place] [facility] [address] [place], [place], [address] Phone: [phone]   Fax:  [phone]  Name: Tiffany Hawkins MRN: [id number] Date of Birth: July 21, 1945

## 2021-02-28 NOTE — Progress Notes (Signed)
Chronic Care Management Pharmacy Assistant   Name: TRYNITI LAATSCH  MRN: [id number] DOB: 11-14-1945  Tiffany Hawkins is an 75 y.o. year old female who presents for his initial CCM visit with the clinical pharmacist.  Reason for Encounter: Chart Prep    Conditions to be addressed/monitored: HTN, Type 2 DM, HLD, CKD II.  Primary concerns for visit include: HTN, Type 2 DM.  Recent office visits:  02/02/21 Jonetta Osgood, NP. For follow-up. No medication changes.  11/01/20 McDonough, Si Gaul, PA-C. For follow-up. No medication changes.  09/30/20 Jonetta Osgood, NP. For follow-up. No medication changes.  08/31/20 Jonetta Osgood, NP. For follow-up. STARTED TUSSIONEX PENNKINETIC ER 79ml at bedtime PRN.   Recent consult visits:  02/22/21 Vascular Surgery Dominica Severin, MD. For follow-up. No medication changes. 01/12/21 Vascular Surgery Kris Hartmann, NP. For follow-up. No medication changes.  01/03/21 Physical Medicine and Rehabilitation Girtha Hake, MD. No medication changes.  12/29/20 Orthopedic Surgery Posey Pronto, Arlee Muslim and Scarlett Presto No more information given. 12/29/20 Cardiology Callwood, Dwayne D No more information given.  12/28/20 Vasc Surg Lucky Cowboy Erskine Squibb, MD. For new patient visit No medication changes.  12/01/20 OBGYN Malachy Mood, MD. For pelvis pain. No medication changes. 11/30/20 Podiatry Luana Shu, Mitzi Hansen No medication changes.  10/25/20 Dermatology Benitez-Graham, Dell[place]e No more information given.  10/07/20 Oncology Earlie Server, MD. For follow-up. No medication changes.  09/17/20 Ophthalmology Eulogio Bear No more information given.   Hospital visits:  01/24/21 [facility] (4 Hours) Dew, Erskine Squibb, MD. Per note: Ultrasound procedure done.   Medication History: Lovastatin 20 mg 12/30/20 90 DS Farxiga 10 mg 03/01/20 90 DS (Patient assistance)  Medications: Outpatient Encounter Medications as of 02/28/2021  Medication Sig   albuterol  (VENTOLIN HFA) 108 (90 Base) MCG/ACT inhaler Inhale 2 puffs into the lungs every 6 (six) hours as needed for wheezing or shortness of breath.   amLODipine (NORVASC) 5 MG tablet TAKE 2 TABLETS DAILY.   Blood Glucose Monitoring Suppl (TRUE METRIX METER) w/Device KIT USE AS DIRECTED   cetirizine (ZYRTEC) 10 MG chewable tablet Chew 1 tablet (10 mg total) by mouth daily.   chlorpheniramine-HYDROcodone (TUSSIONEX PENNKINETIC ER) 10-8 MG/5ML SUER Take 5 mLs by mouth at bedtime as needed for cough.   citalopram (CELEXA) 20 MG tablet TAKE 2 TABLETS DAILY FOR DEPRESSION.   Dapagliflozin Propanediol (FARXIGA PO) Take by mouth.   fenofibrate 54 MG tablet Take 1 tablet (54 mg total) by mouth daily.   fluticasone (FLONASE) 50 MCG/ACT nasal spray Place 2 sprays into both nostrils daily.   HYDROcodone-acetaminophen (NORCO/VICODIN) 5-325 MG tablet Take by mouth.   ibuprofen (ADVIL) 800 MG tablet Take 1 tablet (800 mg total) by mouth every 8 (eight) hours as needed.   insulin aspart protamine - aspart (NOVOLOG MIX 70/30 FLEXPEN) (70-30) 100 UNIT/ML FlexPen Inject 0.35 mLs (35 Units total) into the skin 2 (two) times daily. Inject 35 units [place] QAM. Gradually increase to 40units [place] QPM   lovastatin (MEVACOR) 20 MG tablet TAKE 1 TABLET AT BEDTIME FOR HIGH CHOLESTEROL   Multiple Vitamin (MULTIVITAMIN) tablet Take by mouth daily. TAKES 1/2 TABLET   pantoprazole (PROTONIX) 20 MG tablet Take 2 tablets (40 mg total) by mouth daily.   sharps container 1 each by Does not apply route as needed. To use for insulin syringes and needles. Taking insulin at least twice daily.  E11.65   spironolactone (ALDACTONE) 100 MG tablet Take 100 mg by mouth daily. 2 tablets once  a day   TRUE METRIX BLOOD GLUCOSE TEST test strip TEST BLOOD SUGAR THREE TIMES DAILY AFTER MEALS   TRUEplus Lancets 33G MISC USE AS DIRECTED TO TEST BLOOD SUGAR THREE TIMES DAILY   No facility-administered encounter medications on file as of 02/28/2021.   Have you  seen any other providers since your last visit? Patient stated no.    Any changes in your medications or health? Patient stated no.   Any side effects from any medications? Patient stated no.  Do you have an symptoms or problems not managed by your medications? Patient stated no.  Any concerns about your health right now? Patient stated she is still dizzy and has back pain from a fall but she does do physical therapy.  Has your provider asked that you check blood pressure, blood sugar, or follow special diet at home? Patient stated she checks her blood pressure daily, blood sugar 1-3 times a daily. Patient stated she does monitor her food intake when it comes to sugar and portions.   Do you get any type of exercise on a regular basis? Patient stated she does physical therapy.   Can you think of a goal you would like to reach for your health? Patient stated she would like to loose weight.   Do you have any problems getting your medications? Patient stated no.  Is there anything that you would like to discuss during the appointment? Patient stated she wants to discuss a shingrix and Pneumonia . Patient stated she would like to know how much the novolog cost.   Please bring medications and supplements to appointment, patient reminded of her OTP appointment on 03/02/21 at 9 30 am.  [place], [place] Pharmacist Assistant [phone]

## 2021-03-01 ENCOUNTER — Other Ambulatory Visit: Payer: Self-pay

## 2021-03-01 ENCOUNTER — Encounter: Payer: Medicare HMO | Admitting: Physical Therapy

## 2021-03-01 DIAGNOSIS — R053 Chronic cough: Secondary | ICD-10-CM

## 2021-03-01 DIAGNOSIS — I700 Atherosclerosis of aorta: Secondary | ICD-10-CM

## 2021-03-01 DIAGNOSIS — K219 Gastro-esophageal reflux disease without esophagitis: Secondary | ICD-10-CM

## 2021-03-01 MED ORDER — TRUE METRIX BLOOD GLUCOSE TEST VI STRP: ORAL_STRIP | 3 refills | Status: DC

## 2021-03-01 MED ORDER — FENOFIBRATE 54 MG PO TABS: 54.0000 mg | ORAL_TABLET | Freq: Every day | ORAL | 1 refills | Status: DC

## 2021-03-01 MED ORDER — PANTOPRAZOLE SODIUM 20 MG PO TBEC: 40.0000 mg | DELAYED_RELEASE_TABLET | Freq: Every day | ORAL | 1 refills | Status: DC

## 2021-03-01 MED ORDER — TRUEPLUS LANCETS 33G MISC: 3 refills | Status: DC

## 2021-03-02 ENCOUNTER — Other Ambulatory Visit: Payer: Self-pay

## 2021-03-02 ENCOUNTER — Encounter: Payer: Self-pay | Admitting: Physical Therapy

## 2021-03-02 ENCOUNTER — Ambulatory Visit: Payer: Medicare HMO | Admitting: Student-PharmD

## 2021-03-02 DIAGNOSIS — J301 Allergic rhinitis due to pollen: Secondary | ICD-10-CM

## 2021-03-02 DIAGNOSIS — K219 Gastro-esophageal reflux disease without esophagitis: Secondary | ICD-10-CM

## 2021-03-02 DIAGNOSIS — I10 Essential (primary) hypertension: Secondary | ICD-10-CM

## 2021-03-02 DIAGNOSIS — E782 Mixed hyperlipidemia: Secondary | ICD-10-CM

## 2021-03-02 DIAGNOSIS — E119 Type 2 diabetes mellitus without complications: Secondary | ICD-10-CM

## 2021-03-02 NOTE — Patient Instructions (Addendum)
Visit Information   Goals Addressed             This Visit's Progress    Monitor and Manage My Blood Sugar-Diabetes Type 2   On track    Timeframe:  Long-Range Goal Priority:  High Start Date:   03/02/2021                          Expected End Date: 03/02/2022                      Follow Up Date 06/22/2021    - check blood sugar at prescribed times - check blood sugar if I feel it is too high or too low - enter blood sugar readings and medication or insulin into daily log    Why is this important?   Checking your blood sugar at home helps to keep it from getting very high or very low.  Writing the results in a diary or log helps the doctor know how to care for you.  Your blood sugar log should have the time, date and the results.  Also, write down the amount of insulin or other medicine that you take.  Other information, like what you ate, exercise done and how you were feeling, will also be helpful.     Notes:      Perform Foot Care-Diabetes Type 2   On track    Timeframe:  Long-Range Goal Priority:  High Start Date:   03/02/2021                          Expected End Date:   03/02/2022                    Follow Up Date 06/22/2021    - check feet daily for cuts, sores or redness - wear comfortable, well-fitting shoes    Why is this important?   Good foot care is very important when you have diabetes.  There are many things you can do to keep your feet healthy and catch a problem early.    Notes:      Track and Manage My Blood Pressure-Hypertension   On track    Timeframe:  Long-Range Goal Priority:  High Start Date:  03/02/2021                           Expected End Date:   03/02/2022                    Follow Up Date 06/22/2021    - check blood pressure 3 times per week - write blood pressure results in a log or diary    Why is this important?   You won't feel high blood pressure, but it can still hurt your blood vessels.  High blood pressure can cause heart or  kidney problems. It can also cause a stroke.  Making lifestyle changes like losing a little weight or eating less salt will help.  Checking your blood pressure at home and at different times of the day can help to control blood pressure.  If the doctor prescribes medicine remember to take it the way the doctor ordered.  Call the office if you cannot afford the medicine or if there are questions about it.     Notes:  Patient Care Plan: CCM Pharmacy Care Plan     Problem Identified: HTN, T2MD, HLD, Mood, Allergies   Priority: High     Long-Range Goal: Disease State Mgmt   Start Date: 03/02/2021  Expected End Date: 03/02/2022  This Visit's Progress: On track  Priority: High  Note:   Current Barriers:  Unable to independently afford treatment regimen  Pharmacist Clinical Goal(s):  Patient will verbalize ability to afford treatment regimen through collaboration with PharmD and provider.   Interventions: 1:1 collaboration with Lavera Guise, MD regarding development and update of comprehensive plan of care as evidenced by provider attestation and co-signature Inter-disciplinary care team collaboration (see longitudinal plan of care) Comprehensive medication review performed; medication list updated in electronic medical record  Hypertension (BP goal <130/80) -Controlled -Current treatment: Amlodipine 5mg  2 tabs daily Spironolactone 100mg  2 tabs daily -Medications previously tried: Bisoprolol-HCTZ, Lisinopril (allergic)  -Current home readings: 02/28/21: 125/79; 02/23/21: 136/79 -Current dietary habits: See diabetes management -Current exercise habits: has a sitting bike that she uses about 15 min per day -Denies hypotensive/hypertensive symptoms -Educated on BP goals and benefits of medications for prevention of heart attack, stroke and kidney damage; Importance of home blood pressure monitoring; -Counseled to monitor BP at home at least twice per week, document, and  provide log at future appointments -Counseled on diet and exercise extensively Recommended to continue current medication -Patient discussed trouble handling 2 pills for amlodipine, would like rx to be switched to amlodipine 10mg  tablets to just take 1 tablet per dose. Will discuss with provider.  Hyperlipidemia: (LDL goal < 70) -Not ideally controlled -Current treatment: Lovastatin 20mg  at bedtime Fenofibrate 54mg  daily -Medications previously tried: None noted  -Current dietary patterns: see diabetes -Current exercise habits: see above -Educated on Cholesterol goals;  Benefits of statin for ASCVD risk reduction; Importance of limiting foods high in cholesterol; -Counseled on diet and exercise extensively Recommended to continue current medication -Will try to increase time on exercise bike to increase leg stamina, exercise time, and gain confidence in movement  Diabetes (A1c goal <7%) -Controlled -Current medications: Farxiga 10mg  daily Novolog mix 70/30 35 units every morning and 40 units every evening -Medications previously tried: Invokana, Glipizide, Metformin, Victoza, Ozempic  -Current home glucose readings fasting glucose: 175-250 post prandial glucose: no readings -Denies hypoglycemic/hyperglycemic symptoms -Current meal patterns:  breakfast: eggs, bacon, toast  lunch: salad, dinner leftovers  dinner: meat and veggies, biscuits snacks: cookie, banana bread drinks: water, coffee -Current exercise: see above -Educated on A1c and blood sugar goals; Benefits of routine self-monitoring of blood sugar; -Counseled to check feet daily and get yearly eye exams -Recommended to continue current medication Assessed patient finances. Will try to pursue Novolog Mix PAP with spousal consent  Depression/Anxiety (Goal: well-controlled mood) -Controlled -Current treatment: Citalopram 20mg  2 tabs daily -Medications previously tried/failed: Lexapro, xanax -Overall very cheerful  demeanor and pleasure to speak with -Expressed interest in switching citalopram to 40mg  tab to get her full dose, to avoid having to take so many pills -Will consult with provider to switch rx to 40mg  tab -Recommended to continue current medication  Allergic Rhinitis (Goal: managed allergy symptoms) -Controlled -Current treatment  Zyrtec 10mg  chewable Albuterol HFA Tussionex Flonase -Recommended to continue current medication  GERD (Goal: limited acid relfex) -Controlled -Current treatment  Pantoprazole 20mg  2 tabs daily -Medications previously tried: None noted  -Counseled on diet and exercise extensively Educated on risk of developing weak bones with continued use of pantoprazole. Patient expressed interest in trying to  wean off medication and control future reflux flares with OTC famotidine. Educated patient to avoid eating right before bed and to recognize trigger foods. Patient will drop down to pantoprazole 20mg  1 tab daily for next week to see how that works, if goes well, will drop down to 1 tab every other day, then stop    Patient Goals/Self-Care Activities Patient will:  - take medications as prescribed as evidenced by patient report and record review check glucose once a day, document, and provide at future appointments check blood pressure 2-3 times per week, document, and provide at future appointments -Will attempt to increase exercise time on bike by 5 min each week to get back up to her usual hour on bike  Follow Up Plan: Telephone follow up appointment with care management team member scheduled for: 06/22/2021     Ms. Knapper was given information about Chronic Care Management services today including:  CCM service includes personalized support from designated clinical staff supervised by her physician, including individualized plan of care and coordination with other care providers 24/7 contact phone numbers for assistance for urgent and routine care  needs. Standard insurance, coinsurance, copays and deductibles apply for chronic care management only during months in which we provide at least 20 minutes of these services. Most insurances cover these services at 100%, however patients may be responsible for any copay, coinsurance and/or deductible if applicable. This service may help you avoid the need for more expensive face-to-face services. Only one practitioner may furnish and bill the service in a calendar month. The patient may stop CCM services at any time (effective at the end of the month) by phone call to the office staff.  Patient agreed to services and verbal consent obtained.   The patient verbalized understanding of instructions, educational materials, and care plan provided today and agreed to receive a mailed copy of patient instructions, educational materials, and care plan.  Telephone follow up appointment with pharmacy team member scheduled for: 06/22/2021  Alena Bills, [facility]  Clinical Pharmacist [phone]

## 2021-03-03 ENCOUNTER — Encounter: Payer: Medicare HMO | Admitting: Physical Therapy

## 2021-03-03 ENCOUNTER — Other Ambulatory Visit: Payer: Self-pay

## 2021-03-03 ENCOUNTER — Ambulatory Visit: Payer: Medicare HMO | Admitting: Physical Therapy

## 2021-03-03 DIAGNOSIS — S3210XS Unspecified fracture of sacrum, sequela: Secondary | ICD-10-CM

## 2021-03-03 DIAGNOSIS — Z9181 History of falling: Secondary | ICD-10-CM | POA: Diagnosis not present

## 2021-03-03 DIAGNOSIS — M545 Low back pain, unspecified: Secondary | ICD-10-CM

## 2021-03-03 DIAGNOSIS — M89662 Osteopathy after poliomyelitis, left lower leg: Secondary | ICD-10-CM | POA: Diagnosis not present

## 2021-03-03 DIAGNOSIS — S322XXS Fracture of coccyx, sequela: Secondary | ICD-10-CM | POA: Diagnosis not present

## 2021-03-03 DIAGNOSIS — R29898 Other symptoms and signs involving the musculoskeletal system: Secondary | ICD-10-CM | POA: Diagnosis not present

## 2021-03-03 DIAGNOSIS — B91 Sequelae of poliomyelitis: Secondary | ICD-10-CM | POA: Diagnosis not present

## 2021-03-03 NOTE — Therapy (Signed)
[facility] [address] [place], [place], [address] Phone: [phone]   Fax:  [phone]  Physical Therapy Treatment  Patient Details  Name: Tiffany Hawkins MRN: [id number] Date of Birth: 1945/05/01 Referring Provider (PT): Girtha Hake MD   Encounter Date: 02/28/2021  Treatment: 12 of 17.  Recert date: 70/14/1030 1115 to 72   Past Medical History:  Diagnosis Date   Anxiety    Colon cancer ([place]) 10/12/2014   Stage IIB; had chemo   Diabetes mellitus without complication (HCC)    Hyperlipidemia    Hypertension    Lateral epicondylitis    Personal history of chemotherapy    Polio    childhood   Skin cancer    Sqamous Cell, Lt Calf   Sleep apnea    no CPAP, sx improved with wt loss    Past Surgical History:  Procedure Laterality Date   APPENDECTOMY     BACK SURGERY     CATARACT EXTRACTION W/PHACO Left 12/03/2017   Procedure: CATARACT EXTRACTION PHACO AND INTRAOCULAR LENS PLACEMENT ([place]) LEFT  DIABETIC;  Surgeon: Eulogio Bear, MD;  Location: [place];  Service: Ophthalmology;  Laterality: Left;  Diabetic - insulin   CATARACT EXTRACTION W/PHACO Right 01/14/2018   Procedure: CATARACT EXTRACTION PHACO AND INTRAOCULAR LENS PLACEMENT ([facility]) RIGHT DIABETIC;  Surgeon: Eulogio Bear, MD;  Location: [place];  Service: Ophthalmology;  Laterality: Right;  Diabetic - insulin   CERVICAL DISC SURGERY     CHOLECYSTECTOMY     COLON SURGERY     COLONOSCOPY     COLONOSCOPY WITH PROPOFOL N/A 05/17/2015   Procedure: COLONOSCOPY WITH PROPOFOL;  Surgeon: Manya Silvas, MD;  Location: [facility] ENDOSCOPY;  Service: Endoscopy;  Laterality: N/A;   COLONOSCOPY WITH PROPOFOL N/A 04/01/2018   Procedure: COLONOSCOPY WITH BIOPSY;  Surgeon: [name], MD;  Location: [place];  Service: Endoscopy;  Laterality: N/A;  Diabetic - insulin   EMBOLIZATION N/A 01/24/2021   Procedure: EMBOLIZATION;  Surgeon:  Algernon Huxley, MD;  Location: [place] [facility];  Service: Cardiovascular;  Laterality: N/A;   ESOPHAGOGASTRODUODENOSCOPY     HERNIA REPAIR     LOWER EXTREMITY VENOGRAPHY Left 01/24/2021   Procedure: LOWER EXTREMITY VENOGRAPHY;  Surgeon: Algernon Huxley, MD;  Location: [place] [facility];  Service: Cardiovascular;  Laterality: Left;   POLYPECTOMY N/A 04/01/2018   Procedure: POLYPECTOMY;  Surgeon: [name], MD;  Location: [place];  Service: Endoscopy;  Laterality: N/A;   PORT A CATH INJECTION ([place] HX)     Port has been removed   TUBAL LIGATION      There were no vitals filed for this visit.    Pt. returned to church on Sunday and husband assisted pt. with ascending/ descending stairs. No LOB or falls reported.       Ther.ex.:   Nustep L4 B UE/LE (warm-up)- discussed pts. Mobility at church/ stairs.    Standing hip ex. (All planes)- 3# all planes.  Seated 3# LAQ/ heel raises/ step touches 20x each  Step lunges in //-bars with static holds as tolerated (light UE assist for safety).      Neuro.mm.:   Walking in //-bars with high marching/ lateral walking with light to no UE assist 3x each.    Stair training with SBA/CGA to ascend with recip. Pattern and descend with step to pattern (use of 1 handrail).  Incorporated use of SPC to encourage pt. Use at church.  STS with limited UE assist from gray chair 5x2  Hallway walking/ obstacle course (hurdles/ cone taps// blue mat).    Walking outside on varying terrain/ parking lot/ grass/ curbs.          PT Long Term Goals - 02/22/21 1458       PT LONG TERM GOAL #1   Title Pt will improve MMT scores by 1/2 grade to promote functional strength gains for transfers, amb, and decreased caregiver burden.    Baseline 4/4- Hip flexion  4/4-  Hip abduction  4/4-  Hip adduction  4/3+  Knee extension  4/3+ Knee flexion 4/3+ Ankle dorsiflexion  4/3+ Ankle plantarflexion    Time 8    Period Weeks    Status On-going     Target Date 03/10/21      PT LONG TERM GOAL #2   Title Pt will improve BERG score by 5 pts to promote safety with ambulation and functional tasks.    Baseline BERG: 18/56    Time 8    Period Weeks    Status On-going    Target Date 03/10/21      PT LONG TERM GOAL #3   Title Pt will improve FOTO score to predicted improvement value of 56 to measure self reported improvement in functional mobility.    Baseline Initial eval: 36.   11/7: 54 (decrease in back pain but limitations in safe ambulation).    Time 8    Period Weeks    Status Partially Met    Target Date 03/10/21      PT LONG TERM GOAL #4   Title Pt will demonstrates safety with ambulation using FWW with no LOB and only occ verbal cueing and SBA with 10 minutes of overground walking.    Baseline Pt amb 1 min with extensive verbal cueing CGA-min A.    Time 8    Period Weeks    Status Not Met    Target Date 03/10/21      PT LONG TERM GOAL #5   Title Pt will decrease worse pain to 7/10 or less to promote greater access to functional mobility.    Baseline Present 7/10, Best 4/10, Worst 10/10:    Time 8    Period Weeks    Status Partially Met    Target Date 03/10/21                 Plan - 03/02/21 0950     Clinical Impression Statement Pt. continues to be challenged with dynamic balance tasks/ obstacle course and walking tasks on outside/ uneven terrain. Pt. benefits from use of RW or SPC with SBA/CGA for safety.  Pt. cued to take standing rest breaks/ regroup when gait scissoring/ LOB.  Pt. requires CGA/min. A to safely ascend/ descend 8 stairs with use of 1 handrail and step to pattern with proper technique. Moderate LE muscle fatigue with standing ther.ex./ balance tasks. Pt. will continue to benefit from LE strengthening ex. program and focus on dynamic balance/ gait tasks to improve safety/ prevent falls. Pt. encouraged to use SPC or RW with daily walking, esp. walking outside/ church.    Personal Factors and  Comorbidities Comorbidity 3+;Age;Transportation;Fitness    Comorbidities Hx cancer, previous back surgery, DM, vertigo, Aortic atherosclerosis ([place])    Examination-Activity Limitations Stairs;Reach Overhead;Stand;Dressing;Sit;Transfers;Lift;Caring for Others;Carry;Locomotion Level;Squat    Examination-Participation Restrictions Interpersonal Relationship;Cleaning;Laundry;Yard Work;Driving;Meal Prep;Shop    Stability/Clinical Decision Making Evolving/Moderate complexity    Clinical Decision Making Moderate    Rehab  Potential Fair    PT Frequency 2x / week    PT Duration 8 weeks    PT Treatment/Interventions ADLs/Self Care Home Management;Aquatic Therapy;Neuromuscular re-education;Balance training;Therapeutic exercise;Therapeutic activities;Functional mobility training;Stair training;Gait training;DME Instruction;Patient/family education;Manual techniques;Energy conservation;Biofeedback;Electrical Stimulation;Moist Heat;Traction;Ultrasound;Dry needling;Passive range of motion;Vestibular;Vasopneumatic Device;Spinal Manipulations;Joint Manipulations    PT Next Visit Plan Dynamic balance/ gait activities.  Discuss dizziness    PT Home Exercise Plan deferred    Consulted and Agree with Plan of Care Patient             Patient will benefit from skilled therapeutic intervention in order to improve the following deficits and impairments:  Abnormal gait, Decreased endurance, Decreased activity tolerance, Decreased strength, Pain, Difficulty walking, Decreased mobility, Decreased balance, Decreased range of motion, Improper body mechanics, Postural dysfunction, Decreased safety awareness, Decreased coordination, Decreased knowledge of precautions, Decreased knowledge of use of DME, Dizziness, Hypomobility, Impaired flexibility, Impaired sensation, Impaired perceived functional ability  Visit Diagnosis: Bilateral low back pain, unspecified chronicity, unspecified whether sciatica present  At high risk  for injury related to fall  Closed fracture of sacrum and coccyx, sequela  Bilateral leg weakness  Polio osteopathy of lower leg, left [facility])     Problem List Patient Active Problem List   Diagnosis Date Noted   Aortic atherosclerosis ([place]) 12/29/2020   Deformity of toe of left foot 03/28/2020   H/O multiple pulmonary nodules 09/22/2019   Chronic cough 09/22/2019   Perennial allergic rhinitis 09/17/2019   Acute upper respiratory infection 08/28/2019   Weakness 08/28/2019   Acute bronchitis 06/01/2019   Cough productive of yellow sputum 06/01/2019   Vasomotor rhinitis 05/01/2019   Chronic kidney disease, stage II (mild) 03/14/2019   Gastroesophageal reflux disease without esophagitis 03/14/2019   Intractable episodic headache 03/14/2019   Need for prophylactic vaccination with combined diphtheria-tetanus-pertussis (DTaP) vaccine 03/14/2019   Renal cyst 11/27/2018   Abnormality of gait and mobility 11/27/2018   Hepatic steatosis 10/31/2018   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   Elevated ferritin 10/31/2018   Vertigo 08/13/2018   Gastroenteritis 05/09/2018   Diarrhea 05/09/2018   Nausea 05/09/2018   Personal history of colon cancer    Polyp of sigmoid colon    Dysuria 02/23/2018   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 02/10/2018   Encounter for general adult medical examination with abnormal findings 02/10/2018   Acute non-recurrent pansinusitis 08/24/2017   Other headache syndrome 08/24/2017   Uncontrolled type 2 diabetes mellitus with hyperglycemia ([place]) 08/24/2017   Type 2 diabetes mellitus without complication, with long-term current use of insulin ([place]) 08/22/2017   Need for vaccination against Streptococcus pneumoniae using pneumococcal conjugate vaccine 13 08/22/2017   Essential hypertension 05/03/2017   Otalgia, bilateral 05/02/2017   Type 2 diabetes mellitus with hyperglycemia ([place]) 05/02/2017   Mixed hyperlipidemia 05/02/2017   Right  lower quadrant pain 06/29/2016   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 [place], [place], [address] Phone: [phone]   Fax:  [phone]  Name: Tiffany Hawkins MRN: [id number] Date of Birth: 03-20-1946

## 2021-03-04 ENCOUNTER — Encounter: Payer: Self-pay | Admitting: Nurse Practitioner

## 2021-03-04 ENCOUNTER — Ambulatory Visit (INDEPENDENT_AMBULATORY_CARE_PROVIDER_SITE_OTHER): Payer: Medicare HMO | Admitting: Nurse Practitioner

## 2021-03-04 ENCOUNTER — Ambulatory Visit: Payer: Medicare HMO | Admitting: Hospice and Palliative Medicine

## 2021-03-04 ENCOUNTER — Telehealth: Payer: Self-pay | Admitting: Student-PharmD

## 2021-03-04 ENCOUNTER — Ambulatory Visit: Payer: Medicare HMO | Admitting: Physician Assistant

## 2021-03-04 VITALS — BP 134/73 | HR 95 | Temp 98.0°F | Resp 16 | Ht 62.0 in | Wt 152.6 lb

## 2021-03-04 DIAGNOSIS — K219 Gastro-esophageal reflux disease without esophagitis: Secondary | ICD-10-CM | POA: Diagnosis not present

## 2021-03-04 DIAGNOSIS — I700 Atherosclerosis of aorta: Secondary | ICD-10-CM | POA: Diagnosis not present

## 2021-03-04 DIAGNOSIS — E559 Vitamin D deficiency, unspecified: Secondary | ICD-10-CM

## 2021-03-04 DIAGNOSIS — I10 Essential (primary) hypertension: Secondary | ICD-10-CM | POA: Diagnosis not present

## 2021-03-04 DIAGNOSIS — Z0001 Encounter for general adult medical examination with abnormal findings: Secondary | ICD-10-CM | POA: Diagnosis not present

## 2021-03-04 DIAGNOSIS — Z794 Long term (current) use of insulin: Secondary | ICD-10-CM | POA: Diagnosis not present

## 2021-03-04 DIAGNOSIS — E119 Type 2 diabetes mellitus without complications: Secondary | ICD-10-CM | POA: Diagnosis not present

## 2021-03-04 DIAGNOSIS — R300 Dysuria: Secondary | ICD-10-CM | POA: Diagnosis not present

## 2021-03-04 NOTE — Progress Notes (Signed)
[facility] [place], [place] [address]  Internal MEDICINE  Office Visit Note  Patient Name: Tiffany Hawkins  [id number]  [id number]  Date of Service: 03/04/2021  Chief Complaint  Patient presents with   Medicare Wellness   Diabetes   Hyperlipidemia   Hypertension   Anxiety    HPI Tiffany Hawkins presents for an annual well visit and physical exam.She is a well appearing 75 yo female. Routine colonoscopy was done in 2019. She is no longer getting mammograms. Her diabetic foot exam was done in July and diabetic eye exam was done in June. She recently fell and cracked her lower spine area, she is doing physical therapy now. She also has chronic back pain but no new pains reported today. She is currently taking 58m protonix daily, can go back up to 40 mg if needed.  She does not need any refills today. She is due for routine labs.  She had a carotid ultrasound in august this year that showed less than 50% stenosis and no plaque formation bilaterally.  Her last A1C was 7.0 in October, due again in January.    Current Medication: Outpatient Encounter Medications as of 03/04/2021  Medication Sig   albuterol (VENTOLIN HFA) 108 (90 Base) MCG/ACT inhaler Inhale 2 puffs into the lungs every 6 (six) hours as needed for wheezing or shortness of breath.   amLODipine (NORVASC) 5 MG tablet TAKE 2 TABLETS DAILY.   Blood Glucose Monitoring Suppl (TRUE METRIX METER) w/Device KIT USE AS DIRECTED   cetirizine (ZYRTEC) 10 MG chewable tablet Chew 1 tablet (10 mg total) by mouth daily.   chlorpheniramine-HYDROcodone (TUSSIONEX PENNKINETIC ER) 10-8 MG/5ML SUER Take 5 mLs by mouth at bedtime as needed for cough.   citalopram (CELEXA) 20 MG tablet TAKE 2 TABLETS DAILY FOR DEPRESSION.   Dapagliflozin Propanediol (FARXIGA PO) Take by mouth.   fenofibrate 54 MG tablet Take 1 tablet (54 mg total) by mouth daily.   fluticasone (FLONASE) 50 MCG/ACT nasal spray Place 2 sprays into both  nostrils daily.   glucose blood (TRUE METRIX BLOOD GLUCOSE TEST) test strip TEST BLOOD SUGAR THREE TIMES DAILY AFTER MEALS   ibuprofen (ADVIL) 800 MG tablet Take 1 tablet (800 mg total) by mouth every 8 (eight) hours as needed.   insulin aspart protamine - aspart (NOVOLOG MIX 70/30 FLEXPEN) (70-30) 100 UNIT/ML FlexPen Inject 0.35 mLs (35 Units total) into the skin 2 (two) times daily. Inject 35 units Coin QAM. Gradually increase to 40units [place] QPM   lovastatin (MEVACOR) 20 MG tablet TAKE 1 TABLET AT BEDTIME FOR HIGH CHOLESTEROL   Multiple Vitamin (MULTIVITAMIN) tablet Take by mouth daily. TAKES 1/2 TABLET   pantoprazole (PROTONIX) 20 MG tablet Take 2 tablets (40 mg total) by mouth daily.   sharps container 1 each by Does not apply route as needed. To use for insulin syringes and needles. Taking insulin at least twice daily.  E11.65   spironolactone (ALDACTONE) 100 MG tablet Take 100 mg by mouth daily. 2 tablets once a day   TRUEplus Lancets 33G MISC Use as directed three times a day E11.65   [DISCONTINUED] HYDROcodone-acetaminophen (NORCO/VICODIN) 5-325 MG tablet Take by mouth. (Patient not taking: Reported on 03/02/2021)   No facility-administered encounter medications on file as of 03/04/2021.    Surgical History: Past Surgical History:  Procedure Laterality Date   APPENDECTOMY     BACK SURGERY     CATARACT EXTRACTION W/PHACO Left 12/03/2017   Procedure: CATARACT EXTRACTION PHACO AND INTRAOCULAR  LENS PLACEMENT ([facility]) LEFT  DIABETIC;  Surgeon: Eulogio Bear, MD;  Location: [place];  Service: Ophthalmology;  Laterality: Left;  Diabetic - insulin   CATARACT EXTRACTION W/PHACO Right 01/14/2018   Procedure: CATARACT EXTRACTION PHACO AND INTRAOCULAR LENS PLACEMENT ([facility]) RIGHT DIABETIC;  Surgeon: Eulogio Bear, MD;  Location: [place];  Service: Ophthalmology;  Laterality: Right;  Diabetic - insulin   CERVICAL DISC SURGERY     CHOLECYSTECTOMY     COLON SURGERY      COLONOSCOPY     COLONOSCOPY WITH PROPOFOL N/A 05/17/2015   Procedure: COLONOSCOPY WITH PROPOFOL;  Surgeon: Manya Silvas, MD;  [facility] ENDOSCOPY;  Service: Endoscopy;  Laterality: N/A;   COLONOSCOPY WITH PROPOFOL N/A 04/01/2018   Procedure: COLONOSCOPY WITH BIOPSY;  Surgeon: [name], MD;  Location: [place];  Service: Endoscopy;  Laterality: N/A;  Diabetic - insulin   EMBOLIZATION N/A 01/24/2021   Procedure: EMBOLIZATION;  Surgeon: Algernon Huxley, MD;  Location: [place] [facility];  Service: Cardiovascular;  Laterality: N/A;   ESOPHAGOGASTRODUODENOSCOPY     HERNIA REPAIR     LOWER EXTREMITY VENOGRAPHY Left 01/24/2021   Procedure: LOWER EXTREMITY VENOGRAPHY;  Surgeon: Algernon Huxley, MD;  Location: [place] [facility];  Service: Cardiovascular;  Laterality: Left;   POLYPECTOMY N/A 04/01/2018   Procedure: POLYPECTOMY;  Surgeon: [name], MD;  Location: [place];  Service: Endoscopy;  Laterality: N/A;   PORT A CATH INJECTION ([place] HX)     Port has been removed   TUBAL LIGATION      Medical History: Past Medical History:  Diagnosis Date   Anxiety    Colon cancer ([place]) 10/12/2014   Stage IIB; had chemo   Diabetes mellitus without complication (HCC)    Hyperlipidemia    Hypertension    Lateral epicondylitis    Personal history of chemotherapy    Polio    childhood   Skin cancer    Sqamous Cell, Lt Calf   Sleep apnea    no CPAP, sx improved with wt loss    Family History: Family History  Problem Relation Age of Onset   Alzheimer's disease Mother    Heart disease Father    Colon cancer Cousin    Colon cancer Cousin    Cancer Cousin        Brain Tumor   Breast cancer Neg Hx     Social History   Socioeconomic History   Marital status: Married    Spouse name: Not on file   Number of children: Not on file   Years of education: Not on file   Highest education level: Not on file  Occupational History   Not on file  Tobacco Use    Smoking status: Former    Packs/day: 1.50    Years: 20.00    Pack years: 30.00    Types: Cigarettes    Quit date: 04/17/1981    Years since quitting: 39.9   Smokeless tobacco: Never  Vaping Use   Vaping Use: Never used  Substance and Sexual Activity   Alcohol use: No   Drug use: No   Sexual activity: Yes  Other Topics Concern   Not on file  Social History Narrative   Not on file   Social Determinants of Health   Financial Resource Strain: Low Risk    Difficulty of Paying Living Expenses: Not very hard  Food Insecurity: Not on file  Transportation Needs: Not on file  Physical Activity: Not on file  Stress: Not on file  Social Connections: Not on file  Intimate Partner Violence: Not on file      Review of Systems  Constitutional:  Negative for activity change, appetite change, chills, fatigue, fever and unexpected weight change.  HENT: Negative.  Negative for congestion, ear pain, rhinorrhea, sore throat and trouble swallowing.   Eyes: Negative.   Respiratory: Negative.  Negative for cough, chest tightness, shortness of breath and wheezing.   Cardiovascular: Negative.  Negative for chest pain.  Gastrointestinal: Negative.  Negative for abdominal pain, blood in stool, constipation, diarrhea, nausea and vomiting.  Endocrine: Negative.   Genitourinary: Negative.  Negative for difficulty urinating, dysuria, frequency, hematuria and urgency.  Musculoskeletal: Negative.  Negative for arthralgias, back pain, joint swelling, myalgias and neck pain.  Skin: Negative.  Negative for rash and wound.  Allergic/Immunologic: Negative.  Negative for immunocompromised state.  Neurological: Negative.  Negative for dizziness, seizures, numbness and headaches.  Hematological: Negative.   Psychiatric/Behavioral: Negative.  Negative for behavioral problems, self-injury and suicidal ideas. The patient is not nervous/anxious.    Vital Signs: BP 134/73   Pulse 95   Temp 98 F (36.7 C)   Resp  16   Ht 5' 2"  (1.575 m)   Wt 152 lb 9.6 oz (69.2 kg)   SpO2 98%   BMI 27.91 kg/m    Physical Exam Vitals reviewed.  Constitutional:      General: She is awake. She is not in acute distress.    Appearance: Normal appearance. She is well-developed, well-groomed and normal weight. She is not ill-appearing or diaphoretic.  HENT:     Head: Normocephalic and atraumatic.     Right Ear: Tympanic membrane, ear canal and external ear normal.     Left Ear: Tympanic membrane, ear canal and external ear normal.     Nose: Nose normal. No congestion or rhinorrhea.     Mouth/Throat:     Lips: Pink.     Mouth: Mucous membranes are moist.     Pharynx: Oropharynx is clear. Uvula midline. No oropharyngeal exudate or posterior oropharyngeal erythema.  Eyes:     General: Lids are normal. Vision grossly intact. Gaze aligned appropriately. No scleral icterus.       Right eye: No discharge.        Left eye: No discharge.     Extraocular Movements: Extraocular movements intact.     Conjunctiva/sclera: Conjunctivae normal.     Pupils: Pupils are equal, round, and reactive to light.     Funduscopic exam:    Right eye: Red reflex present.        Left eye: Red reflex present. Neck:     Thyroid: No thyromegaly.     Vascular: No carotid bruit or JVD.     Trachea: Trachea and phonation normal. No tracheal deviation.  Cardiovascular:     Rate and Rhythm: Normal rate and regular rhythm.     Pulses: Normal pulses.          Dorsalis pedis pulses are 2+ on the right side and 2+ on the left side.       Posterior tibial pulses are 2+ on the right side and 2+ on the left side.     Heart sounds: Normal heart sounds, S1 normal and S2 normal. No murmur heard.   No friction rub. No gallop.  Pulmonary:     Effort: Pulmonary effort is normal. No accessory muscle usage or respiratory distress.  Breath sounds: Normal breath sounds and air entry. No stridor. No wheezing or rales.  Chest:     Chest wall: No  tenderness.     Comments: Declined clinical breast exam Abdominal:     General: Bowel sounds are normal. There is no distension.     Palpations: Abdomen is soft. There is no shifting dullness, fluid wave, mass or pulsatile mass.     Tenderness: There is no abdominal tenderness. There is no guarding or rebound.  Musculoskeletal:        General: No tenderness or deformity. Normal range of motion.     Cervical back: Normal range of motion and neck supple.     Right foot: Prominent metatarsal heads present.     Left foot: Prominent metatarsal heads present.  Feet:     Right foot:     Protective Sensation: 6 sites tested.  5 sites sensed.     Skin integrity: Callus present.     Toenail Condition: Right toenails are abnormally thick, long and ingrown.     Left foot:     Protective Sensation: 6 sites tested.  5 sites sensed.     Skin integrity: Callus present.     Toenail Condition: Left toenails are abnormally thick, long and ingrown.  Lymphadenopathy:     Cervical: No cervical adenopathy.  Skin:    General: Skin is warm and dry.     Capillary Refill: Capillary refill takes less than 2 seconds.     Coloration: Skin is not pale.     Findings: No erythema or rash.  Neurological:     Mental Status: She is alert and oriented to person, place, and time.     Cranial Nerves: No cranial nerve deficit.     Motor: No abnormal muscle tone.     Coordination: Coordination normal.     Gait: Gait normal.     Deep Tendon Reflexes: Reflexes are normal and symmetric.  Psychiatric:        Mood and Affect: Mood and affect normal.        Behavior: Behavior normal. Behavior is cooperative.        Thought Content: Thought content normal.        Judgment: Judgment normal.       Assessment/Plan: 1. Encounter for general adult medical examination with abnormal findings Age-appropriate preventive screenings and vaccinations discussed, annual physical exam completed. Routine labs for health maintenance  ordered, see below. PHM updated.   2. Essential hypertension Stable, routine labs ordered - CBC with Differential/Platelet  3. Type 2 diabetes mellitus without complication, with long-term current use of insulin (HCC) Stable, routine labs ordered - CMP14+EGFR - CBC with Differential/Platelet  4. Aortic atherosclerosis ([place]) Routine labs ordered - Lipid Profile - CBC with Differential/Platelet  5. Gastroesophageal reflux disease without esophagitis Taking protonix, may increase dose back to 40 if needed  6. Vitamin D deficiency Routine lab ordered - Vitamin D (25 hydroxy)  7. Dysuria Routine urinalysis done - UA/M w/rflx Culture, Routine - Microscopic Examination - Urine Culture, Reflex    General Counseling: Lendora verbalizes understanding of the findings of todays visit and agrees with plan of treatment. I have discussed any further diagnostic evaluation that may be needed or ordered today. We also reviewed her medications today. she has been encouraged to call the office with any questions or concerns that should arise related to todays visit.    Orders Placed This Encounter  Procedures   CMP14+EGFR   Lipid Profile  CBC with Differential/Platelet   Vitamin D (25 hydroxy)    No orders of the defined types were placed in this encounter.   Return in about 2 months (around 05/04/2021) for F/U, Recheck A1C, Damyn Weitzel PCP.   Total time spent:30 Minutes Time spent includes review of chart, medications, test results, and follow up plan with the patient.   [place] Controlled Substance Database was reviewed by me.  This patient was seen by Jonetta Osgood, FNP-C in collaboration with Dr. Clayborn Bigness as a part of collaborative care agreement.  Kathaleen Dudziak R. Valetta Fuller, MSN, FNP-C Internal medicine

## 2021-03-04 NOTE — Progress Notes (Signed)
  Chronic Care Management Pharmacy Assistant   Name: Tiffany Hawkins  MRN: [id number] DOB: 11/09/1945  Reason for Encounter: PAP   Medications: Outpatient Encounter Medications as of 03/04/2021  Medication Sig   albuterol (VENTOLIN HFA) 108 (90 Base) MCG/ACT inhaler Inhale 2 puffs into the lungs every 6 (six) hours as needed for wheezing or shortness of breath.   amLODipine (NORVASC) 5 MG tablet TAKE 2 TABLETS DAILY.   Blood Glucose Monitoring Suppl (TRUE METRIX METER) w/Device KIT USE AS DIRECTED   cetirizine (ZYRTEC) 10 MG chewable tablet Chew 1 tablet (10 mg total) by mouth daily.   chlorpheniramine-HYDROcodone (TUSSIONEX PENNKINETIC ER) 10-8 MG/5ML SUER Take 5 mLs by mouth at bedtime as needed for cough.   citalopram (CELEXA) 20 MG tablet TAKE 2 TABLETS DAILY FOR DEPRESSION.   Dapagliflozin Propanediol (FARXIGA PO) Take by mouth.   fenofibrate 54 MG tablet Take 1 tablet (54 mg total) by mouth daily.   fluticasone (FLONASE) 50 MCG/ACT nasal spray Place 2 sprays into both nostrils daily.   glucose blood (TRUE METRIX BLOOD GLUCOSE TEST) test strip TEST BLOOD SUGAR THREE TIMES DAILY AFTER MEALS   ibuprofen (ADVIL) 800 MG tablet Take 1 tablet (800 mg total) by mouth every 8 (eight) hours as needed.   insulin aspart protamine - aspart (NOVOLOG MIX 70/30 FLEXPEN) (70-30) 100 UNIT/ML FlexPen Inject 0.35 mLs (35 Units total) into the skin 2 (two) times daily. Inject 35 units [place] QAM. Gradually increase to 40units [place] QPM   lovastatin (MEVACOR) 20 MG tablet TAKE 1 TABLET AT BEDTIME FOR HIGH CHOLESTEROL   Multiple Vitamin (MULTIVITAMIN) tablet Take by mouth daily. TAKES 1/2 TABLET   pantoprazole (PROTONIX) 20 MG tablet Take 2 tablets (40 mg total) by mouth daily.   sharps container 1 each by Does not apply route as needed. To use for insulin syringes and needles. Taking insulin at least twice daily.  E11.65   spironolactone (ALDACTONE) 100 MG tablet Take 100 mg by mouth daily. 2 tablets once a day    TRUEplus Lancets 33G MISC Use as directed three times a day E11.65   No facility-administered encounter medications on file as of 03/04/2021.   New patient assistance application form filled out to [employer] for Novolog Mix 70/30. Waiting for patient and provider to complete and sign documentation. Called patient to inquire if they wanted the application mailed to them or if they wanted to come into the office. Patient is required to sign application and to bring/have proof of income. She stated she would be willing to come into office to bring proof of income and sign application once all paperwork came in the mail she would like it mailed to their residence address [address] [facility] [place] [address].  Follow-Up:Pharmacist Review  Charlann Lange, [place] Pharmacist Assistant [phone]

## 2021-03-05 NOTE — Therapy (Signed)
[facility] [address] [place], [place], [address] Phone: [phone]   Fax:  [phone]  Physical Therapy Treatment  Patient Details  Name: Tiffany Hawkins MRN: [id number] Date of Birth: February 09, 1946 Referring Provider (PT): Girtha Hake MD   Encounter Date: 03/03/2021   PT End of Session - 03/05/21 1123     Visit Number 13    Number of Visits 17    Date for PT Re-Evaluation 03/10/21    Authorization - Visit Number 3    Authorization - Number of Visits 10    Progress Note Due on Visit 10    PT Start Time 1027    PT Stop Time 1116    PT Time Calculation (min) 49 min    Equipment Utilized During Treatment Gait belt   4WW and FWW   Activity Tolerance Patient tolerated treatment well;Patient limited by fatigue   increased unsteadiness after BPPV treatment.   Behavior During Therapy [facility] for tasks assessed/performed             Past Medical History:  Diagnosis Date   Anxiety    Colon cancer ([place]) 10/12/2014   Stage IIB; had chemo   Diabetes mellitus without complication (HCC)    Hyperlipidemia    Hypertension    Lateral epicondylitis    Personal history of chemotherapy    Polio    childhood   Skin cancer    Sqamous Cell, Lt Calf   Sleep apnea    no CPAP, sx improved with wt loss    Past Surgical History:  Procedure Laterality Date   APPENDECTOMY     BACK SURGERY     CATARACT EXTRACTION W/PHACO Left 12/03/2017   Procedure: CATARACT EXTRACTION PHACO AND INTRAOCULAR LENS PLACEMENT ([place]) LEFT  DIABETIC;  Surgeon: Eulogio Bear, MD;  Location: [place];  Service: Ophthalmology;  Laterality: Left;  Diabetic - insulin   CATARACT EXTRACTION W/PHACO Right 01/14/2018   Procedure: CATARACT EXTRACTION PHACO AND INTRAOCULAR LENS PLACEMENT ([facility]) RIGHT DIABETIC;  Surgeon: Eulogio Bear, MD;  Location: [place];  Service: Ophthalmology;  Laterality: Right;  Diabetic - insulin   CERVICAL DISC  SURGERY     CHOLECYSTECTOMY     COLON SURGERY     COLONOSCOPY     COLONOSCOPY WITH PROPOFOL N/A 05/17/2015   Procedure: COLONOSCOPY WITH PROPOFOL;  Surgeon: Manya Silvas, MD;  Location: [facility] ENDOSCOPY;  Service: Endoscopy;  Laterality: N/A;   COLONOSCOPY WITH PROPOFOL N/A 04/01/2018   Procedure: COLONOSCOPY WITH BIOPSY;  Surgeon: [name], MD;  Location: [place];  Service: Endoscopy;  Laterality: N/A;  Diabetic - insulin   EMBOLIZATION N/A 01/24/2021   Procedure: EMBOLIZATION;  Surgeon: Algernon Huxley, MD;  Location: [place] CV LAB;  Service: Cardiovascular;  Laterality: N/A;   ESOPHAGOGASTRODUODENOSCOPY     HERNIA REPAIR     LOWER EXTREMITY VENOGRAPHY Left 01/24/2021   Procedure: LOWER EXTREMITY VENOGRAPHY;  Surgeon: Algernon Huxley, MD;  Location: [place] CV LAB;  Service: Cardiovascular;  Laterality: Left;   POLYPECTOMY N/A 04/01/2018   Procedure: POLYPECTOMY;  Surgeon: [name], MD;  Location: [place];  Service: Endoscopy;  Laterality: N/A;   PORT A CATH INJECTION ([place] [facility])     Port has been removed   TUBAL LIGATION      There were no vitals filed for this visit.    Pt. states she has done well this week and no falls reported. Pt. feels  PT is helping. Pt. has been happy with her ability to ascend/ descend stairs with husbands assist to go to church.       Ther.ex.:   Nustep L3 B UE/LE (warm-up)- discussed household chores/ activities.     Step lunges in //-bars with static holds as tolerated (light UE assist for safety).   Seated RTB ex.: marching/ hip abduction 20x each.  Seated LAQ 20x.      Neuro.mm.:   Walking in //-bars with high marching/ lateral walking with light to no UE assist 3x each.   Picking up cones outside of //-bars with CGA.  Extra time required/ increase low back symptoms.   Sled push/pull (5# wts.x 2) to simulate shopping cart at grocery store.     Stair training with SBA/CGA to ascend with recip.  Pattern and descend with step to pattern (use of 1 handrail).  Incorporated use of SPC to encourage pt. Use at church.     Hallway walking with added green hurdles.  Pt. Feels more secure while leading with R LE over hurdles.    Hallway walking with added head turns/ identifying letter and numbers.  Difficulty with looking up due to balance.   Walking outside to car with mod. Assist and no assistive device.  Pt. Resistent to using assistive device.         PT Long Term Goals - 02/22/21 1458       PT LONG TERM GOAL #1   Title Pt will improve MMT scores by 1/2 grade to promote functional strength gains for transfers, amb, and decreased caregiver burden.    Baseline 4/4- Hip flexion  4/4-  Hip abduction  4/4-  Hip adduction  4/3+  Knee extension  4/3+ Knee flexion 4/3+ Ankle dorsiflexion  4/3+ Ankle plantarflexion    Time 8    Period Weeks    Status On-going    Target Date 03/10/21      PT LONG TERM GOAL #2   Title Pt will improve BERG score by 5 pts to promote safety with ambulation and functional tasks.    Baseline BERG: 18/56    Time 8    Period Weeks    Status On-going    Target Date 03/10/21      PT LONG TERM GOAL #3   Title Pt will improve FOTO score to predicted improvement value of 56 to measure self reported improvement in functional mobility.    Baseline Initial eval: 36.   11/7: 54 (decrease in back pain but limitations in safe ambulation).    Time 8    Period Weeks    Status Partially Met    Target Date 03/10/21      PT LONG TERM GOAL #4   Title Pt will demonstrates safety with ambulation using FWW with no LOB and only occ verbal cueing and SBA with 10 minutes of overground walking.    Baseline Pt amb 1 min with extensive verbal cueing CGA-min A.    Time 8    Period Weeks    Status Not Met    Target Date 03/10/21      PT LONG TERM GOAL #5   Title Pt will decrease worse pain to 7/10 or less to promote greater access to functional mobility.    Baseline Present  7/10, Best 4/10, Worst 10/10:    Time 8    Period Weeks    Status Partially Met    Target Date 03/10/21  Plan - 03/05/21 1125     Clinical Impression Statement Moderate LE muscle fatigue noted as tx. progresses and pt. requires min. to mod. A to safely walk without assistive device to car at end of tx.  Pt. had several episodes of scissoring gait during hallway walking without assistive device. Pt. requires CGA for safety with dynamic balance/ walking ex. in clinic.  PT discussed the importance of using SPC or RW at home but is resistant and relies on husband. Ascends/ descends 8 stairs with use of 1 handrail and step to gait pattern.  Persistent c/o low back discomfort with prolonged standing/ walking activities.  Pt. instructed to amb. on a daily basis and complete HEP.    Personal Factors and Comorbidities Comorbidity 3+;Age;Transportation;Fitness    Comorbidities Hx cancer, previous back surgery, DM, vertigo, Aortic atherosclerosis ([place])    Examination-Activity Limitations Stairs;Reach Overhead;Stand;Dressing;Sit;Transfers;Lift;Caring for Others;Carry;Locomotion Level;Squat    Examination-Participation Restrictions Interpersonal Relationship;Cleaning;Laundry;Yard Work;Driving;Meal Prep;Shop    Stability/Clinical Decision Making Evolving/Moderate complexity    Clinical Decision Making Moderate    Rehab Potential Fair    PT Frequency 2x / week    PT Duration 8 weeks    PT Treatment/Interventions ADLs/Self Care Home Management;Aquatic Therapy;Neuromuscular re-education;Balance training;Therapeutic exercise;Therapeutic activities;Functional mobility training;Stair training;Gait training;DME Instruction;Patient/family education;Manual techniques;Energy conservation;Biofeedback;Electrical Stimulation;Moist Heat;Traction;Ultrasound;Dry needling;Passive range of motion;Vestibular;Vasopneumatic Device;Spinal Manipulations;Joint Manipulations    PT Next Visit Plan Dynamic  balance/ gait activities.  Discuss dizziness.  Request more PT visits from [facility].    PT Home Exercise Plan deferred    Consulted and Agree with Plan of Care Patient             Patient will benefit from skilled therapeutic intervention in order to improve the following deficits and impairments:  Abnormal gait, Decreased endurance, Decreased activity tolerance, Decreased strength, Pain, Difficulty walking, Decreased mobility, Decreased balance, Decreased range of motion, Improper body mechanics, Postural dysfunction, Decreased safety awareness, Decreased coordination, Decreased knowledge of precautions, Decreased knowledge of use of DME, Dizziness, Hypomobility, Impaired flexibility, Impaired sensation, Impaired perceived functional ability  Visit Diagnosis: Bilateral low back pain, unspecified chronicity, unspecified whether sciatica present  At high risk for injury related to fall  Closed fracture of sacrum and coccyx, sequela  Bilateral leg weakness  Polio osteopathy of lower leg, left [facility])     Problem List Patient Active Problem List   Diagnosis Date Noted   Aortic atherosclerosis ([place]) 12/29/2020   Deformity of toe of left foot 03/28/2020   H/O multiple pulmonary nodules 09/22/2019   Chronic cough 09/22/2019   Perennial allergic rhinitis 09/17/2019   Acute upper respiratory infection 08/28/2019   Weakness 08/28/2019   Acute bronchitis 06/01/2019   Cough productive of yellow sputum 06/01/2019   Vasomotor rhinitis 05/01/2019   Chronic kidney disease, stage II (mild) 03/14/2019   Gastroesophageal reflux disease without esophagitis 03/14/2019   Intractable episodic headache 03/14/2019   Need for prophylactic vaccination with combined diphtheria-tetanus-pertussis (DTaP) vaccine 03/14/2019   Renal cyst 11/27/2018   Abnormality of gait and mobility 11/27/2018   Hepatic steatosis 10/31/2018   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[place]) 10/31/2018   Elevated ferritin  10/31/2018   Vertigo 08/13/2018   Gastroenteritis 05/09/2018   Diarrhea 05/09/2018   Nausea 05/09/2018   Personal history of colon cancer    Polyp of sigmoid colon    Dysuria 02/23/2018   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 02/10/2018   Encounter for general adult medical examination with abnormal findings 02/10/2018   Acute non-recurrent pansinusitis 08/24/2017  Other headache syndrome 08/24/2017   Uncontrolled type 2 diabetes mellitus with hyperglycemia ([place]) 08/24/2017   Type 2 diabetes mellitus without complication, with long-term current use of insulin ([place]) 08/22/2017   Need for vaccination against Streptococcus pneumoniae using pneumococcal conjugate vaccine 13 08/22/2017   Essential hypertension 05/03/2017   Otalgia, bilateral 05/02/2017   Type 2 diabetes mellitus with hyperglycemia ([place]) 05/02/2017   Mixed hyperlipidemia 05/02/2017   Right lower quadrant pain 06/29/2016   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   History of colon cancer 04/13/2015   Cancer of ascending colon ([place]) 10/12/2014   Pura Spice, PT, DPT # [phone] 03/05/2021, 11:32 AM  [facility] [address] [place], [place], [address] Phone: [phone]   Fax:  [phone]  Name: Tiffany Hawkins MRN: [id number] Date of Birth: [birth year]

## 2021-03-08 ENCOUNTER — Other Ambulatory Visit: Payer: Self-pay

## 2021-03-08 ENCOUNTER — Other Ambulatory Visit: Payer: Self-pay | Admitting: Internal Medicine

## 2021-03-08 ENCOUNTER — Telehealth: Payer: Self-pay

## 2021-03-08 ENCOUNTER — Ambulatory Visit: Payer: Medicare HMO

## 2021-03-08 DIAGNOSIS — R29898 Other symptoms and signs involving the musculoskeletal system: Secondary | ICD-10-CM

## 2021-03-08 DIAGNOSIS — Z9181 History of falling: Secondary | ICD-10-CM

## 2021-03-08 DIAGNOSIS — S3210XS Unspecified fracture of sacrum, sequela: Secondary | ICD-10-CM | POA: Diagnosis not present

## 2021-03-08 DIAGNOSIS — M89662 Osteopathy after poliomyelitis, left lower leg: Secondary | ICD-10-CM | POA: Diagnosis not present

## 2021-03-08 DIAGNOSIS — S322XXS Fracture of coccyx, sequela: Secondary | ICD-10-CM | POA: Diagnosis not present

## 2021-03-08 DIAGNOSIS — M545 Low back pain, unspecified: Secondary | ICD-10-CM | POA: Diagnosis not present

## 2021-03-08 DIAGNOSIS — B91 Sequelae of poliomyelitis: Secondary | ICD-10-CM | POA: Diagnosis not present

## 2021-03-08 NOTE — Therapy (Signed)
[place] [facility] [address] [place], [place], [address] Phone: [phone]   Fax:  [phone]  Physical Therapy Treatment  Patient Details  Name: Tiffany Hawkins MRN: [id number] Date of Birth: 04-Feb-1946 Referring Provider (PT): Girtha Hake MD   Encounter Date: 03/08/2021   PT End of Session - 03/08/21 1124     Visit Number 14    Number of Visits 17    Date for PT Re-Evaluation 03/10/21    Authorization - Visit Number 4    Authorization - Number of Visits 10    Progress Note Due on Visit 10    PT Start Time 1118    PT Stop Time 1200    PT Time Calculation (min) 42 min    Equipment Utilized During Treatment Gait belt   4WW and FWW   Activity Tolerance Patient tolerated treatment well;Patient limited by fatigue   increased unsteadiness after BPPV treatment.   Behavior During Therapy [facility] for tasks assessed/performed             Past Medical History:  Diagnosis Date   Anxiety    Colon cancer ([place]) 10/12/2014   Stage IIB; had chemo   Diabetes mellitus without complication (HCC)    Hyperlipidemia    Hypertension    Lateral epicondylitis    Personal history of chemotherapy    Polio    childhood   Skin cancer    Sqamous Cell, Lt Calf   Sleep apnea    no CPAP, sx improved with wt loss    Past Surgical History:  Procedure Laterality Date   APPENDECTOMY     BACK SURGERY     CATARACT EXTRACTION W/PHACO Left 12/03/2017   Procedure: CATARACT EXTRACTION PHACO AND INTRAOCULAR LENS PLACEMENT ([place]) LEFT  DIABETIC;  Surgeon: Eulogio Bear, MD;  Location: [place];  Service: Ophthalmology;  Laterality: Left;  Diabetic - insulin   CATARACT EXTRACTION W/PHACO Right 01/14/2018   Procedure: CATARACT EXTRACTION PHACO AND INTRAOCULAR LENS PLACEMENT ([facility]) RIGHT DIABETIC;  Surgeon: Eulogio Bear, MD;  Location: [place];  Service: Ophthalmology;  Laterality: Right;  Diabetic - insulin   CERVICAL DISC  SURGERY     CHOLECYSTECTOMY     COLON SURGERY     COLONOSCOPY     COLONOSCOPY WITH PROPOFOL N/A 05/17/2015   Procedure: COLONOSCOPY WITH PROPOFOL;  Surgeon: Manya Silvas, MD;  Location[facility];  Service: Endoscopy;  Laterality: N/A;   COLONOSCOPY WITH PROPOFOL N/A 04/01/2018   Procedure: COLONOSCOPY WITH BIOPSY;  Surgeon: [name], MD;  Location: [place];  Service: Endoscopy;  Laterality: N/A;  Diabetic - insulin   EMBOLIZATION N/A 01/24/2021   Procedure: EMBOLIZATION;  Surgeon: Algernon Huxley, MD;  Location: [place] [facility];  Service: Cardiovascular;  Laterality: N/A;   ESOPHAGOGASTRODUODENOSCOPY     HERNIA REPAIR     LOWER EXTREMITY VENOGRAPHY Left 01/24/2021   Procedure: LOWER EXTREMITY VENOGRAPHY;  Surgeon: Algernon Huxley, MD;  Location: [place] [facility];  Service: Cardiovascular;  Laterality: Left;   POLYPECTOMY N/A 04/01/2018   Procedure: POLYPECTOMY;  Surgeon: [name], MD;  Location: [place];  Service: Endoscopy;  Laterality: N/A;   PORT A CATH INJECTION ([place] [facility])     Port has been removed   TUBAL LIGATION      There were no vitals filed for this visit.   Subjective Assessment - 03/08/21 1123     Subjective Pt reports improve balance. However  endorses LE weakness and fatigue in quads and she is unsure why they remain tired feeling.    Pertinent History per referring note: "Patient notes that she does have a history of chronic back pain which did worsen after her fall. She also endorses a history of polio. Over the past month she feels that she has been more off balance and has had more falls. She is also noticing worsening weakness in her legs. She rates her pain as an 8/10. Is intermittent, stiff. She denies any numbness, tingling or loss of control of bowel or bladder. " H/o Hx cancer, previous back surgery, DM, vertigo, Aortic atherosclerosis ([place]).    Limitations Walking;House hold activities;Sitting;Lifting;Standing     Diagnostic tests MRI of lumbar and cervical spine. See imaging history.    Patient Stated Goals Reduce pain and improve balance.    Currently in Pain? Yes    Pain Score 2     Pain Location Back    Pain Orientation Lower    Pain Descriptors / Indicators Aching    Pain Onset 1 to 4 weeks ago    Pain Frequency Constant             There.ex:   Nu Step L2 for 6 min for warm up and LE strength.   STS: 2x10, SUE support for balance. VC's for improving eccentric control with descent on second set.     Neuro Re-Ed: CGA to supervision for all activities. Reduced to SUE support  Forward marches/lateral marches in // bars: x4 laps/direction  Tandem walking in // bars: x4 laps    4 way resisted walking in // bars x1BTB. X5 laps/direction. Most difficulty in frontal plane with balance requiring frequent stepping strategy or use of UE's on bars to correct.    PT Education - 03/08/21 1124     Education provided Yes    Education Details form/technique with exercise.    Person(s) Educated Patient    Methods Explanation;Demonstration    Comprehension Verbalized understanding;Returned demonstration                 PT Long Term Goals - 02/22/21 1458       PT LONG TERM GOAL #1   Title Pt will improve MMT scores by 1/2 grade to promote functional strength gains for transfers, amb, and decreased caregiver burden.    Baseline 4/4- Hip flexion  4/4-  Hip abduction  4/4-  Hip adduction  4/3+  Knee extension  4/3+ Knee flexion 4/3+ Ankle dorsiflexion  4/3+ Ankle plantarflexion    Time 8    Period Weeks    Status On-going    Target Date 03/10/21      PT LONG TERM GOAL #2   Title Pt will improve BERG score by 5 pts to promote safety with ambulation and functional tasks.    Baseline BERG: 18/56    Time 8    Period Weeks    Status On-going    Target Date 03/10/21      PT LONG TERM GOAL #3   Title Pt will improve FOTO score to predicted improvement value of 56 to measure self reported  improvement in functional mobility.    Baseline Initial eval: 36.   11/7: 54 (decrease in back pain but limitations in safe ambulation).    Time 8    Period Weeks    Status Partially Met    Target Date 03/10/21      PT LONG TERM GOAL #4   Title  Pt will demonstrates safety with ambulation using FWW with no LOB and only occ verbal cueing and SBA with 10 minutes of overground walking.    Baseline Pt amb 1 min with extensive verbal cueing CGA-min A.    Time 8    Period Weeks    Status Not Met    Target Date 03/10/21      PT LONG TERM GOAL #5   Title Pt will decrease worse pain to 7/10 or less to promote greater access to functional mobility.    Baseline Present 7/10, Best 4/10, Worst 10/10:    Time 8    Period Weeks    Status Partially Met    Target Date 03/10/21                   Plan - 03/08/21 1235     Clinical Impression Statement Pt tolerated progression of dynamic balance and functional strengthening well endorsing LE fatigue requiring intermittent rest breaks. Pt does require at least SUE support for all balance exercises for balance and to assist with eccentric control with sit to stand exercises. Pt will continue to benefit from skilled PT services to further proggress balance and LE strength to reduce risk of falls.    Personal Factors and Comorbidities Comorbidity 3+;Age;Transportation;Fitness    Comorbidities Hx cancer, previous back surgery, DM, vertigo, Aortic atherosclerosis ([place])    Examination-Activity Limitations Stairs;Reach Overhead;Stand;Dressing;Sit;Transfers;Lift;Caring for Others;Carry;Locomotion Level;Squat    Examination-Participation Restrictions Interpersonal Relationship;Cleaning;Laundry;Yard Work;Driving;Meal Prep;Shop    Stability/Clinical Decision Making Evolving/Moderate complexity    Rehab Potential Fair    PT Frequency 2x / week    PT Duration 8 weeks    PT Treatment/Interventions ADLs/Self Care Home Management;Aquatic Therapy;Neuromuscular  re-education;Balance training;Therapeutic exercise;Therapeutic activities;Functional mobility training;Stair training;Gait training;DME Instruction;Patient/family education;Manual techniques;Energy conservation;Biofeedback;Electrical Stimulation;Moist Heat;Traction;Ultrasound;Dry needling;Passive range of motion;Vestibular;Vasopneumatic Device;Spinal Manipulations;Joint Manipulations    PT Next Visit Plan Dynamic balance/ gait activities.  Discuss dizziness.  Request more PT visits from [facility].    PT Home Exercise Plan deferred    Consulted and Agree with Plan of Care Patient             Patient will benefit from skilled therapeutic intervention in order to improve the following deficits and impairments:  Abnormal gait, Decreased endurance, Decreased activity tolerance, Decreased strength, Pain, Difficulty walking, Decreased mobility, Decreased balance, Decreased range of motion, Improper body mechanics, Postural dysfunction, Decreased safety awareness, Decreased coordination, Decreased knowledge of precautions, Decreased knowledge of use of DME, Dizziness, Hypomobility, Impaired flexibility, Impaired sensation, Impaired perceived functional ability  Visit Diagnosis: Bilateral low back pain, unspecified chronicity, unspecified whether sciatica present  At high risk for injury related to fall  Bilateral leg weakness     Problem List Patient Active Problem List   Diagnosis Date Noted   Aortic atherosclerosis ([place]) 12/29/2020   Deformity of toe of left foot 03/28/2020   H/O multiple pulmonary nodules 09/22/2019   Chronic cough 09/22/2019   Perennial allergic rhinitis 09/17/2019   Acute upper respiratory infection 08/28/2019   Weakness 08/28/2019   Acute bronchitis 06/01/2019   Cough productive of yellow sputum 06/01/2019   Vasomotor rhinitis 05/01/2019   Chronic kidney disease, stage II (mild) 03/14/2019   Gastroesophageal reflux disease without esophagitis 03/14/2019   Intractable  episodic headache 03/14/2019   Need for prophylactic vaccination with combined diphtheria-tetanus-pertussis (DTaP) vaccine 03/14/2019   Renal cyst 11/27/2018   Abnormality of gait and mobility 11/27/2018   Hepatic steatosis 10/31/2018   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[place]) 10/31/2018  Elevated ferritin 10/31/2018   Vertigo 08/13/2018   Gastroenteritis 05/09/2018   Diarrhea 05/09/2018   Nausea 05/09/2018   Personal history of colon cancer    Polyp of sigmoid colon    Dysuria 02/23/2018   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 02/10/2018   Encounter for general adult medical examination with abnormal findings 02/10/2018   Acute non-recurrent pansinusitis 08/24/2017   Other headache syndrome 08/24/2017   Uncontrolled type 2 diabetes mellitus with hyperglycemia ([place]) 08/24/2017   Type 2 diabetes mellitus without complication, with long-term current use of insulin ([place]) 08/22/2017   Need for vaccination against Streptococcus pneumoniae using pneumococcal conjugate vaccine 13 08/22/2017   Essential hypertension 05/03/2017   Otalgia, bilateral 05/02/2017   Type 2 diabetes mellitus with hyperglycemia ([place]) 05/02/2017   Mixed hyperlipidemia 05/02/2017   Right lower quadrant pain 06/29/2016   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   History of colon cancer 04/13/2015   Cancer of ascending colon ([place]) 10/12/2014    Salem Caster. Fairly IV, PT, DPT Physical Therapist- [facility]  03/08/2021, 12:45 PM  [facility] [address] [place], [place], [address] Phone: [phone]   Fax:  [phone]  Name: Tiffany Hawkins MRN: [id number] Date of Birth: Feb 20, 1946

## 2021-03-08 NOTE — Telephone Encounter (Signed)
error 

## 2021-03-09 ENCOUNTER — Telehealth: Payer: Self-pay | Admitting: Student-PharmD

## 2021-03-09 LAB — MICROSCOPIC EXAMINATION
Bacteria, UA: NONE SEEN
Casts: NONE SEEN /lpf

## 2021-03-09 LAB — UA/M W/RFLX CULTURE, ROUTINE
Bilirubin, UA: NEGATIVE
Glucose, UA: NEGATIVE
Ketones, UA: NEGATIVE
Nitrite, UA: NEGATIVE
Protein,UA: NEGATIVE
RBC, UA: NEGATIVE
Specific Gravity, UA: 1.013 (ref 1.005–1.030)
Urobilinogen, Ur: 1 mg/dL (ref 0.2–1.0)
pH, UA: 7.5 (ref 5.0–7.5)

## 2021-03-09 LAB — URINE CULTURE, REFLEX

## 2021-03-09 NOTE — Progress Notes (Signed)
  Chronic Care Management Pharmacy Assistant   Name: PEGGY LOGE  MRN: [id number] DOB: August 02, 1945  Reason for Encounter: CCM Care Plan  Medications: Outpatient Encounter Medications as of 03/09/2021  Medication Sig   albuterol (VENTOLIN HFA) 108 (90 Base) MCG/ACT inhaler Inhale 2 puffs into the lungs every 6 (six) hours as needed for wheezing or shortness of breath.   amLODipine (NORVASC) 5 MG tablet TAKE 2 TABLETS DAILY.   Blood Glucose Monitoring Suppl (TRUE METRIX METER) w/Device KIT USE AS DIRECTED   cetirizine (ZYRTEC) 10 MG chewable tablet Chew 1 tablet (10 mg total) by mouth daily.   chlorpheniramine-HYDROcodone (TUSSIONEX PENNKINETIC ER) 10-8 MG/5ML SUER Take 5 mLs by mouth at bedtime as needed for cough.   citalopram (CELEXA) 20 MG tablet TAKE 2 TABLETS DAILY FOR DEPRESSION.   Dapagliflozin Propanediol (FARXIGA PO) Take by mouth.   fenofibrate 54 MG tablet Take 1 tablet (54 mg total) by mouth daily.   fluticasone (FLONASE) 50 MCG/ACT nasal spray Place 2 sprays into both nostrils daily.   glucose blood (TRUE METRIX BLOOD GLUCOSE TEST) test strip TEST BLOOD SUGAR THREE TIMES DAILY AFTER MEALS   ibuprofen (ADVIL) 800 MG tablet Take 1 tablet (800 mg total) by mouth every 8 (eight) hours as needed.   insulin aspart protamine - aspart (NOVOLOG MIX 70/30 FLEXPEN) (70-30) 100 UNIT/ML FlexPen Inject 0.35 mLs (35 Units total) into the skin 2 (two) times daily. Inject 35 units [place] QAM. Gradually increase to 40units [place] QPM   lovastatin (MEVACOR) 20 MG tablet TAKE 1 TABLET AT BEDTIME FOR HIGH CHOLESTEROL   Multiple Vitamin (MULTIVITAMIN) tablet Take by mouth daily. TAKES 1/2 TABLET   pantoprazole (PROTONIX) 20 MG tablet Take 2 tablets (40 mg total) by mouth daily.   sharps container 1 each by Does not apply route as needed. To use for insulin syringes and needles. Taking insulin at least twice daily.  E11.65   spironolactone (ALDACTONE) 100 MG tablet Take 100 mg by mouth daily. 2 tablets once  a day   TRUEplus Lancets 33G MISC Use as directed three times a day E11.65   No facility-administered encounter medications on file as of 03/09/2021.   Reviewed the patients initial visit reinsured it was completed per the pharmacist Alena Bills request. Printed the CCM care plan. Mailed the patient CCM care plan to their most recent address on file.   Follow-Up:Pharmacist Review  Charlann Lange, [place] Pharmacist Assistant [phone]

## 2021-03-15 ENCOUNTER — Ambulatory Visit: Payer: Medicare HMO | Admitting: Physical Therapy

## 2021-03-15 ENCOUNTER — Other Ambulatory Visit: Payer: Self-pay

## 2021-03-15 DIAGNOSIS — M89662 Osteopathy after poliomyelitis, left lower leg: Secondary | ICD-10-CM | POA: Diagnosis not present

## 2021-03-15 DIAGNOSIS — M545 Low back pain, unspecified: Secondary | ICD-10-CM

## 2021-03-15 DIAGNOSIS — R29898 Other symptoms and signs involving the musculoskeletal system: Secondary | ICD-10-CM

## 2021-03-15 DIAGNOSIS — B91 Sequelae of poliomyelitis: Secondary | ICD-10-CM | POA: Diagnosis not present

## 2021-03-15 DIAGNOSIS — S322XXS Fracture of coccyx, sequela: Secondary | ICD-10-CM

## 2021-03-15 DIAGNOSIS — S3210XS Unspecified fracture of sacrum, sequela: Secondary | ICD-10-CM

## 2021-03-15 DIAGNOSIS — Z9181 History of falling: Secondary | ICD-10-CM

## 2021-03-16 DIAGNOSIS — E119 Type 2 diabetes mellitus without complications: Secondary | ICD-10-CM

## 2021-03-16 DIAGNOSIS — I10 Essential (primary) hypertension: Secondary | ICD-10-CM

## 2021-03-17 ENCOUNTER — Encounter: Payer: Self-pay | Admitting: Physical Therapy

## 2021-03-17 ENCOUNTER — Ambulatory Visit: Payer: Medicare HMO | Attending: Physical Medicine & Rehabilitation | Admitting: Physical Therapy

## 2021-03-17 ENCOUNTER — Other Ambulatory Visit: Payer: Self-pay

## 2021-03-17 DIAGNOSIS — M545 Low back pain, unspecified: Secondary | ICD-10-CM | POA: Diagnosis not present

## 2021-03-17 DIAGNOSIS — B91 Sequelae of poliomyelitis: Secondary | ICD-10-CM | POA: Diagnosis not present

## 2021-03-17 DIAGNOSIS — M89662 Osteopathy after poliomyelitis, left lower leg: Secondary | ICD-10-CM | POA: Diagnosis not present

## 2021-03-17 DIAGNOSIS — S322XXS Fracture of coccyx, sequela: Secondary | ICD-10-CM | POA: Insufficient documentation

## 2021-03-17 DIAGNOSIS — R29898 Other symptoms and signs involving the musculoskeletal system: Secondary | ICD-10-CM | POA: Diagnosis not present

## 2021-03-17 DIAGNOSIS — Z9181 History of falling: Secondary | ICD-10-CM | POA: Diagnosis not present

## 2021-03-17 DIAGNOSIS — S3210XS Unspecified fracture of sacrum, sequela: Secondary | ICD-10-CM | POA: Insufficient documentation

## 2021-03-17 NOTE — Therapy (Signed)
[facility] [address] [place], [place], [address] Phone: [phone]   Fax:  [phone]  Physical Therapy Treatment  Patient Details  Name: Tiffany Hawkins MRN: [id number] Date of Birth: 27-Feb-1946 Referring Provider (PT): Girtha Hake MD   Encounter Date: 03/17/2021   PT End of Session - 03/17/21 1351     Visit Number 16    Number of Visits 22    Date for PT Re-Evaluation 04/12/21    Authorization - Visit Number 6    Authorization - Number of Visits 10    Progress Note Due on Visit 10    PT Start Time 4503    PT Stop Time 1429    PT Time Calculation (min) 48 min    Equipment Utilized During Treatment Gait belt   4WW and FWW   Activity Tolerance Patient tolerated treatment well;Patient limited by fatigue   increased unsteadiness after BPPV treatment.   Behavior During Therapy [facility] for tasks assessed/performed             Past Medical History:  Diagnosis Date   Anxiety    Colon cancer ([place]) 10/12/2014   Stage IIB; had chemo   Diabetes mellitus without complication (HCC)    Hyperlipidemia    Hypertension    Lateral epicondylitis    Personal history of chemotherapy    Polio    childhood   Skin cancer    Sqamous Cell, Lt Calf   Sleep apnea    no CPAP, sx improved with wt loss    Past Surgical History:  Procedure Laterality Date   APPENDECTOMY     BACK SURGERY     CATARACT EXTRACTION W/PHACO Left 12/03/2017   Procedure: CATARACT EXTRACTION PHACO AND INTRAOCULAR LENS PLACEMENT ([place]) LEFT  DIABETIC;  Surgeon: Eulogio Bear, MD;  Location: [place];  Service: Ophthalmology;  Laterality: Left;  Diabetic - insulin   CATARACT EXTRACTION W/PHACO Right 01/14/2018   Procedure: CATARACT EXTRACTION PHACO AND INTRAOCULAR LENS PLACEMENT ([facility]) RIGHT DIABETIC;  Surgeon: Eulogio Bear, MD;  Location: [place];  Service: Ophthalmology;  Laterality: Right;  Diabetic - insulin   CERVICAL DISC  SURGERY     CHOLECYSTECTOMY     COLON SURGERY     COLONOSCOPY     COLONOSCOPY WITH PROPOFOL N/A 05/17/2015   Procedure: COLONOSCOPY WITH PROPOFOL;  Surgeon: Manya Silvas, MD;  Location: [facility] ENDOSCOPY;  Service: Endoscopy;  Laterality: N/A;   COLONOSCOPY WITH PROPOFOL N/A 04/01/2018   Procedure: COLONOSCOPY WITH BIOPSY;  Surgeon: [name], MD;  Location: [place];  Service: Endoscopy;  Laterality: N/A;  Diabetic - insulin   EMBOLIZATION N/A 01/24/2021   Procedure: EMBOLIZATION;  Surgeon: Algernon Huxley, MD;  Location: [place] [facility];  Service: Cardiovascular;  Laterality: N/A;   ESOPHAGOGASTRODUODENOSCOPY     HERNIA REPAIR     LOWER EXTREMITY VENOGRAPHY Left 01/24/2021   Procedure: LOWER EXTREMITY VENOGRAPHY;  Surgeon: Algernon Huxley, MD;  Location: [place] CV [facility];  Service: Cardiovascular;  Laterality: Left;   POLYPECTOMY N/A 04/01/2018   Procedure: POLYPECTOMY;  Surgeon: [name], MD;  Location: [place];  Service: Endoscopy;  Laterality: N/A;   PORT A CATH INJECTION ([place] HX)     Port has been removed   TUBAL LIGATION      There were no vitals filed for this visit.   Subjective Assessment - 03/17/21 1348     Subjective Pt. reports discomfort in low  back region.  No pain reported on VAS.  Pt. states she had a near fall in shower but used grab bars to prevent.  Pts. husband assists with shower to prevent/ decrease fall risk.    Pertinent History per referring note: "Patient notes that she does have a history of chronic back pain which did worsen after her fall. She also endorses a history of polio. Over the past month she feels that she has been more off balance and has had more falls. She is also noticing worsening weakness in her legs. She rates her pain as an 8/10. Is intermittent, stiff. She denies any numbness, tingling or loss of control of bowel or bladder. " H/o Hx cancer, previous back surgery, DM, vertigo, Aortic atherosclerosis ([place]).     Limitations Walking;House hold activities;Sitting;Lifting;Standing    Diagnostic tests MRI of lumbar and cervical spine. See imaging history.    Patient Stated Goals Reduce pain and improve balance.    Currently in Pain? No/denies    Pain Onset More than a month ago              There.ex:                Nustep L4 for 10 min. B UE/LE for warm up and generalized strengthening.               STS from gray chair/ transfers 8x with no UE assist.  SBA/CGA for safety.                            Standing/ walking lunges 15x.  B UE assist in //-bars for safety/ balance.     Discussed HEP     Neuro Re-Ed:    Walking in hallway 6 min. (HR 98, O2 sat. 98%)- 504 steps.    Tandem Airex: lateral walking in //-bars (mod. Cuing/ assist for proper technique and posture)               High marching/ lateral marches in // bars: x4 laps/direction             Tandem walking in // bars: x4 laps (light UE assist required)               Alt. UE/LE in hallway with cuing for upright posture   Recip. Step ups/ step to descending on stairs with R UE only 4x.  Moderate fatigue walking out to car after tx.                     PT Long Term Goals - 03/17/21 0816       PT LONG TERM GOAL #1   Title Pt will improve MMT scores by 1/2 grade to promote functional strength gains for transfers, amb, and decreased caregiver burden.    Baseline 4+/4 Hip flexion  4/4-  Hip abduction  4+/4  Hip adduction  4+/4  Knee extension  4/4 Knee flexion 4/4 Ankle dorsiflexion  4/4 Ankle plantarflexion    Time 4    Period Weeks    Status Partially Met    Target Date 04/12/21      PT LONG TERM GOAL #2   Title Pt will improve BERG score by 5 pts to promote safety with ambulation and functional tasks.    Baseline BERG: 18/56.  11/29: 40/56    Time 4    Period Weeks    Status Partially Met  Target Date 04/12/21      PT LONG TERM GOAL #3   Title Pt will improve FOTO score to predicted improvement value of 56 to  measure self reported improvement in functional mobility.    Baseline Initial eval: 36.   11/7: 54 (decrease in back pain but limitations in safe ambulation).    Time 4    Period Weeks    Status Partially Met    Target Date 04/12/21      PT LONG TERM GOAL #4   Title Pt will demonstrates safety with ambulation using FWW with no LOB and only occ verbal cueing and SBA with 10 minutes of overground walking.    Baseline Pt amb 1 min with extensive verbal cueing CGA-min A.    Time 8    Period Weeks    Status Not Met    Target Date 04/12/21      PT LONG TERM GOAL #5   Title Pt will decrease worse pain to 7/10 or less to promote greater access to functional mobility.    Baseline Present 7/10, Best 4/10, Worst 10/10.  11/29: <3/10 back pain    Time 4    Period Weeks    Status Achieved    Target Date 03/15/21                   Plan - 03/17/21 1449     Clinical Impression Statement Moderate LE muscle fatigue after tx. session resulting in a more staggered gait with shorter stridelength.  Pt. challenged with lunges/ recip. step ups due to quad muscle fatigue.  Light UE assist required with all ther.ex. for balance/ safety.  Pt. required frequent cuing to correct upright posture/ head position during walking and standing ther.ex.  CGA/min. A for safety and verbal t/o tx. session.  Pt. will continue to benefit from LE strengthening ex. program and focus on dynamic balance/ gait tasks to improve safety/ prevent falls.  Pt. encouraged to use SPC or RW with daily walking, esp. walking outside/ into church.    Personal Factors and Comorbidities Comorbidity 3+;Age;Transportation;Fitness    Comorbidities Hx cancer, previous back surgery, DM, vertigo, Aortic atherosclerosis ([place])    Examination-Activity Limitations Stairs;Reach Overhead;Stand;Dressing;Sit;Transfers;Lift;Caring for Others;Carry;Locomotion Level;Squat    Examination-Participation Restrictions Interpersonal  Relationship;Cleaning;Laundry;Yard Work;Driving;Meal Prep;Shop    Stability/Clinical Decision Making Evolving/Moderate complexity    Clinical Decision Making Moderate    Rehab Potential Fair    PT Frequency 2x / week    PT Duration 4 weeks    PT Treatment/Interventions ADLs/Self Care Home Management;Aquatic Therapy;Neuromuscular re-education;Balance training;Therapeutic exercise;Therapeutic activities;Functional mobility training;Stair training;Gait training;DME Instruction;Patient/family education;Manual techniques;Energy conservation;Biofeedback;Electrical Stimulation;Moist Heat;Traction;Ultrasound;Dry needling;Passive range of motion;Vestibular;Vasopneumatic Device;Spinal Manipulations;Joint Manipulations    PT Next Visit Plan Dynamic balance/ gait activities.    Consulted and Agree with Plan of Care Patient             Patient will benefit from skilled therapeutic intervention in order to improve the following deficits and impairments:  Abnormal gait, Decreased endurance, Decreased activity tolerance, Decreased strength, Pain, Difficulty walking, Decreased mobility, Decreased balance, Decreased range of motion, Improper body mechanics, Postural dysfunction, Decreased safety awareness, Decreased coordination, Decreased knowledge of precautions, Decreased knowledge of use of DME, Dizziness, Hypomobility, Impaired flexibility, Impaired sensation, Impaired perceived functional ability  Visit Diagnosis: Bilateral low back pain, unspecified chronicity, unspecified whether sciatica present  At high risk for injury related to fall  Bilateral leg weakness  Closed fracture of sacrum and coccyx, sequela  Polio osteopathy of lower  leg, left [facility])     Problem List Patient Active Problem List   Diagnosis Date Noted   Aortic atherosclerosis ([place]) 12/29/2020   Deformity of toe of left foot 03/28/2020   H/O multiple pulmonary nodules 09/22/2019   Chronic cough 09/22/2019   Perennial  allergic rhinitis 09/17/2019   Acute upper respiratory infection 08/28/2019   Weakness 08/28/2019   Acute bronchitis 06/01/2019   Cough productive of yellow sputum 06/01/2019   Vasomotor rhinitis 05/01/2019   Chronic kidney disease, stage II (mild) 03/14/2019   Gastroesophageal reflux disease without esophagitis 03/14/2019   Intractable episodic headache 03/14/2019   Need for prophylactic vaccination with combined diphtheria-tetanus-pertussis (DTaP) vaccine 03/14/2019   Renal cyst 11/27/2018   Abnormality of gait and mobility 11/27/2018   Hepatic steatosis 10/31/2018   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/31/2018   Elevated ferritin 10/31/2018   Vertigo 08/13/2018   Gastroenteritis 05/09/2018   Diarrhea 05/09/2018   Nausea 05/09/2018   Personal history of colon cancer    Polyp of sigmoid colon    Dysuria 02/23/2018   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 02/10/2018   Encounter for general adult medical examination with abnormal findings 02/10/2018   Acute non-recurrent pansinusitis 08/24/2017   Other headache syndrome 08/24/2017   Uncontrolled type 2 diabetes mellitus with hyperglycemia ([place]) 08/24/2017   Type 2 diabetes mellitus without complication, with long-term current use of insulin ([place]) 08/22/2017   Need for vaccination against Streptococcus pneumoniae using pneumococcal conjugate vaccine 13 08/22/2017   Essential hypertension 05/03/2017   Otalgia, bilateral 05/02/2017   Type 2 diabetes mellitus with hyperglycemia ([place]) 05/02/2017   Mixed hyperlipidemia 05/02/2017   Right lower quadrant pain 06/29/2016   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   History of colon cancer 04/13/2015   Cancer of ascending colon ([place]) 10/12/2014   Pura Spice, PT, DPT # [id number] 03/17/2021, 2:58 PM  [facility] [address] [place], [place], [address] Phone: [phone]   Fax:   [phone]  Name: DEVANI ODONNEL MRN: [id number] Date of Birth: [birth year]

## 2021-03-17 NOTE — Therapy (Signed)
[facility] [address] [place], [place], [address] Phone: [phone]   Fax:  [phone]  Physical Therapy Treatment/ Recertification 73/71 to 04/12/21  Patient Details  Name: Tiffany Hawkins MRN: [id number] Date of Birth: Jul 27, 1945 Referring Provider (PT): Girtha Hake MD   Encounter Date: 03/15/2021   PT End of Session - 03/17/21 0810     Visit Number 15    Number of Visits 22    Date for PT Re-Evaluation 04/12/21    Authorization - Visit Number 5    Authorization - Number of Visits 10    Progress Note Due on Visit 10    PT Start Time 6270    PT Stop Time 1430    PT Time Calculation (min) 46 min    Equipment Utilized During Treatment Gait belt   4WW and FWW   Activity Tolerance Patient tolerated treatment well;Patient limited by fatigue   increased unsteadiness after BPPV treatment.   Behavior During Therapy [facility] for tasks assessed/performed             Past Medical History:  Diagnosis Date   Anxiety    Colon cancer ([place]) 10/12/2014   Stage IIB; had chemo   Diabetes mellitus without complication (HCC)    Hyperlipidemia    Hypertension    Lateral epicondylitis    Personal history of chemotherapy    Polio    childhood   Skin cancer    Sqamous Cell, Lt Calf   Sleep apnea    no CPAP, sx improved with wt loss    Past Surgical History:  Procedure Laterality Date   APPENDECTOMY     BACK SURGERY     CATARACT EXTRACTION W/PHACO Left 12/03/2017   Procedure: CATARACT EXTRACTION PHACO AND INTRAOCULAR LENS PLACEMENT ([place]) LEFT  DIABETIC;  Surgeon: Eulogio Bear, MD;  Location: [place];  Service: Ophthalmology;  Laterality: Left;  Diabetic - insulin   CATARACT EXTRACTION W/PHACO Right 01/14/2018   Procedure: CATARACT EXTRACTION PHACO AND INTRAOCULAR LENS PLACEMENT ([facility]) RIGHT DIABETIC;  Surgeon: Eulogio Bear, MD;  Location: [place];  Service: Ophthalmology;  Laterality: Right;   Diabetic - insulin   CERVICAL DISC SURGERY     CHOLECYSTECTOMY     COLON SURGERY     COLONOSCOPY     COLONOSCOPY WITH PROPOFOL N/A 05/17/2015   Procedure: COLONOSCOPY WITH PROPOFOL;  Surgeon: Manya Silvas, MD;  Location: [facility] ENDOSCOPY;  Service: Endoscopy;  Laterality: N/A;   COLONOSCOPY WITH PROPOFOL N/A 04/01/2018   Procedure: COLONOSCOPY WITH BIOPSY;  Surgeon: [name], MD;  Location: [place];  Service: Endoscopy;  Laterality: N/A;  Diabetic - insulin   EMBOLIZATION N/A 01/24/2021   Procedure: EMBOLIZATION;  Surgeon: Algernon Huxley, MD;  Location: [place] [facility];  Service: Cardiovascular;  Laterality: N/A;   ESOPHAGOGASTRODUODENOSCOPY     HERNIA REPAIR     LOWER EXTREMITY VENOGRAPHY Left 01/24/2021   Procedure: LOWER EXTREMITY VENOGRAPHY;  Surgeon: Algernon Huxley, MD;  Location: [place] [facility];  Service: Cardiovascular;  Laterality: Left;   POLYPECTOMY N/A 04/01/2018   Procedure: POLYPECTOMY;  Surgeon: [name], MD;  Location: [place];  Service: Endoscopy;  Laterality: N/A;   PORT A CATH INJECTION ([place] HX)     Port has been removed   TUBAL LIGATION      There were no vitals filed for this visit.   Subjective Assessment - 03/17/21 0807     Subjective Pt.  states she was really tired after last PT tx. session.  Pt. reports the resisted walking was tiring.  No recent falls reported.  Pt. reports she is staying active at home.    Pertinent History per referring note: "Patient notes that she does have a history of chronic back pain which did worsen after her fall. She also endorses a history of polio. Over the past month she feels that she has been more off balance and has had more falls. She is also noticing worsening weakness in her legs. She rates her pain as an 8/10. Is intermittent, stiff. She denies any numbness, tingling or loss of control of bowel or bladder. " H/o Hx cancer, previous back surgery, DM, vertigo, Aortic atherosclerosis ([place]).     Limitations Walking;House hold activities;Sitting;Lifting;Standing    Diagnostic tests MRI of lumbar and cervical spine. See imaging history.    Patient Stated Goals Reduce pain and improve balance.    Currently in Pain? No/denies    Pain Onset More than a month ago               There.ex:               Nu Step L3-4 for 10 min for warm up and LE strength.              STS: 2x10, SUE support for balance. VC's for improving eccentric control with sitting.    Standing hip ex.: flexion/ abduction/ extension/ lunges/ hamstring curls 20x.     Neuro Re-Ed:               High marching/ lateral marches in // bars: x4 laps/direction             Tandem walking in // bars: x4 laps (light UE assist required)   Berg: 40/56 (high fall risk without use of assistive device).               Alt. UE/LE in hallway with cuing for upright posture  Walking in hallway with head turns/ letters  Airex: tandem stance     PT Long Term Goals - 03/17/21 0816       PT LONG TERM GOAL #1   Title Pt will improve MMT scores by 1/2 grade to promote functional strength gains for transfers, amb, and decreased caregiver burden.    Baseline 4+/4 Hip flexion  4/4-  Hip abduction  4+/4  Hip adduction  4+/4  Knee extension  4/4 Knee flexion 4/4 Ankle dorsiflexion  4/4 Ankle plantarflexion    Time 4    Period Weeks    Status Partially Met    Target Date 04/12/21      PT LONG TERM GOAL #2   Title Pt will improve BERG score by 5 pts to promote safety with ambulation and functional tasks.    Baseline BERG: 18/56.  11/29: 40/56    Time 4    Period Weeks    Status Partially Met    Target Date 04/12/21      PT LONG TERM GOAL #3   Title Pt will improve FOTO score to predicted improvement value of 56 to measure self reported improvement in functional mobility.    Baseline Initial eval: 36.   11/7: 54 (decrease in back pain but limitations in safe ambulation).    Time 4    Period Weeks    Status Partially  Met    Target Date 04/12/21      PT LONG TERM GOAL #  4   Title Pt will demonstrates safety with ambulation using FWW with no LOB and only occ verbal cueing and SBA with 10 minutes of overground walking.    Baseline Pt amb 1 min with extensive verbal cueing CGA-min A.    Time 8    Period Weeks    Status Not Met    Target Date 04/12/21      PT LONG TERM GOAL #5   Title Pt will decrease worse pain to 7/10 or less to promote greater access to functional mobility.    Baseline Present 7/10, Best 4/10, Worst 10/10.  11/29: <3/10 back pain    Time 4    Period Weeks    Status Achieved    Target Date 03/15/21                   Plan - 03/17/21 2951     Clinical Impression Statement Pt. continues to present with LE fatigue/ balance issues with prolonged walking without assistive device.  Pt. benefits from use of RW or SPC with SBA/CGA for safety and cuing to correct BOS/ step pattern.  Pt. cued to take standing rest breaks/ regroup when gait scissoring/ LOB. Pt. requires CGA/min. A to safely ascend/ descend 8 stairs with use of 1 handrail and step to pattern with proper technique. Moderate LE muscle fatigue with standing ther.ex./ balance tasks.  See updated goals.  Pt. will continue to benefit from LE strengthening ex. program and focus on dynamic balance/ gait tasks to improve safety/ prevent falls. Pt. encouraged to use SPC or RW with daily walking, esp. walking outside/ church.    Personal Factors and Comorbidities Comorbidity 3+;Age;Transportation;Fitness    Comorbidities Hx cancer, previous back surgery, DM, vertigo, Aortic atherosclerosis ([place])    Examination-Activity Limitations Stairs;Reach Overhead;Stand;Dressing;Sit;Transfers;Lift;Caring for Others;Carry;Locomotion Level;Squat    Examination-Participation Restrictions Interpersonal Relationship;Cleaning;Laundry;Yard Work;Driving;Meal Prep;Shop    Stability/Clinical Decision Making Evolving/Moderate complexity    Clinical Decision  Making Moderate    Rehab Potential Fair    PT Frequency 2x / week    PT Duration 4 weeks    PT Treatment/Interventions ADLs/Self Care Home Management;Aquatic Therapy;Neuromuscular re-education;Balance training;Therapeutic exercise;Therapeutic activities;Functional mobility training;Stair training;Gait training;DME Instruction;Patient/family education;Manual techniques;Energy conservation;Biofeedback;Electrical Stimulation;Moist Heat;Traction;Ultrasound;Dry needling;Passive range of motion;Vestibular;Vasopneumatic Device;Spinal Manipulations;Joint Manipulations    PT Next Visit Plan Dynamic balance/ gait activities.    Consulted and Agree with Plan of Care Patient             Patient will benefit from skilled therapeutic intervention in order to improve the following deficits and impairments:  Abnormal gait, Decreased endurance, Decreased activity tolerance, Decreased strength, Pain, Difficulty walking, Decreased mobility, Decreased balance, Decreased range of motion, Improper body mechanics, Postural dysfunction, Decreased safety awareness, Decreased coordination, Decreased knowledge of precautions, Decreased knowledge of use of DME, Dizziness, Hypomobility, Impaired flexibility, Impaired sensation, Impaired perceived functional ability  Visit Diagnosis: Bilateral low back pain, unspecified chronicity, unspecified whether sciatica present  At high risk for injury related to fall  Bilateral leg weakness  Closed fracture of sacrum and coccyx, sequela  Polio osteopathy of lower leg, left [facility])     Problem List Patient Active Problem List   Diagnosis Date Noted   Aortic atherosclerosis ([place]) 12/29/2020   Deformity of toe of left foot 03/28/2020   H/O multiple pulmonary nodules 09/22/2019   Chronic cough 09/22/2019   Perennial allergic rhinitis 09/17/2019   Acute upper respiratory infection 08/28/2019   Weakness 08/28/2019   Acute bronchitis 06/01/2019   Cough productive of  yellow  sputum 06/01/2019   Vasomotor rhinitis 05/01/2019   Chronic kidney disease, stage II (mild) 03/14/2019   Gastroesophageal reflux disease without esophagitis 03/14/2019   Intractable episodic headache 03/14/2019   Need for prophylactic vaccination with combined diphtheria-tetanus-pertussis (DTaP) vaccine 03/14/2019   Renal cyst 11/27/2018   Abnormality of gait and mobility 11/27/2018   Hepatic steatosis 10/31/2018   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[place]) 10/31/2018   Elevated ferritin 10/31/2018   Vertigo 08/13/2018   Gastroenteritis 05/09/2018   Diarrhea 05/09/2018   Nausea 05/09/2018   Personal history of colon cancer    Polyp of sigmoid colon    Dysuria 02/23/2018   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 02/10/2018   Encounter for general adult medical examination with abnormal findings 02/10/2018   Acute non-recurrent pansinusitis 08/24/2017   Other headache syndrome 08/24/2017   Uncontrolled type 2 diabetes mellitus with hyperglycemia ([place]) 08/24/2017   Type 2 diabetes mellitus without complication, with long-term current use of insulin ([place]) 08/22/2017   Need for vaccination against Streptococcus pneumoniae using pneumococcal conjugate vaccine 13 08/22/2017   Essential hypertension 05/03/2017   Otalgia, bilateral 05/02/2017   Type 2 diabetes mellitus with hyperglycemia ([place]) 05/02/2017   Mixed hyperlipidemia 05/02/2017   Right lower quadrant pain 06/29/2016   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   History of colon cancer 04/13/2015   Cancer of ascending colon ([place]) 10/12/2014   Pura Spice, PT, DPT # [id number] 03/17/2021, 8:20 AM  [facility] [address]. [place], [place], [address] Phone: [phone]   Fax:  [phone]  Name: Tiffany Hawkins MRN: [id number] Date of Birth: [birth year]

## 2021-03-22 ENCOUNTER — Other Ambulatory Visit: Payer: Self-pay

## 2021-03-22 ENCOUNTER — Ambulatory Visit: Payer: Medicare HMO | Admitting: Physical Therapy

## 2021-03-22 DIAGNOSIS — M545 Low back pain, unspecified: Secondary | ICD-10-CM | POA: Diagnosis not present

## 2021-03-22 DIAGNOSIS — S3210XS Unspecified fracture of sacrum, sequela: Secondary | ICD-10-CM

## 2021-03-22 DIAGNOSIS — Z9181 History of falling: Secondary | ICD-10-CM | POA: Diagnosis not present

## 2021-03-22 DIAGNOSIS — B91 Sequelae of poliomyelitis: Secondary | ICD-10-CM

## 2021-03-22 DIAGNOSIS — M89662 Osteopathy after poliomyelitis, left lower leg: Secondary | ICD-10-CM | POA: Diagnosis not present

## 2021-03-22 DIAGNOSIS — S322XXS Fracture of coccyx, sequela: Secondary | ICD-10-CM | POA: Diagnosis not present

## 2021-03-22 DIAGNOSIS — R29898 Other symptoms and signs involving the musculoskeletal system: Secondary | ICD-10-CM

## 2021-03-22 NOTE — Therapy (Signed)
[facility] [address] [place], [place], [address] Phone: [phone]   Fax:  [phone]  Physical Therapy Treatment  Patient Details  Name: Tiffany Hawkins MRN: [id number] Date of Birth: 1945-07-24 Referring Provider (PT): Girtha Hake MD   Encounter Date: 03/22/2021   PT End of Session - 03/22/21 1323     Visit Number 16    Number of Visits 22    Date for PT Re-Evaluation 04/12/21    Authorization - Visit Number 5    Authorization - Number of Visits 10    Progress Note Due on Visit 10    PT Start Time 5573    PT Stop Time 1400    PT Time Calculation (min) 45 min    Equipment Utilized During Treatment Gait belt   4WW and FWW   Activity Tolerance Patient tolerated treatment well;Patient limited by fatigue   increased unsteadiness after BPPV treatment.   Behavior During Therapy [facility] for tasks assessed/performed             Past Medical History:  Diagnosis Date   Anxiety    Colon cancer ([place]) 10/12/2014   Stage IIB; had chemo   Diabetes mellitus without complication (HCC)    Hyperlipidemia    Hypertension    Lateral epicondylitis    Personal history of chemotherapy    Polio    childhood   Skin cancer    Sqamous Cell, Lt Calf   Sleep apnea    no CPAP, sx improved with wt loss    Past Surgical History:  Procedure Laterality Date   APPENDECTOMY     BACK SURGERY     CATARACT EXTRACTION W/PHACO Left 12/03/2017   Procedure: CATARACT EXTRACTION PHACO AND INTRAOCULAR LENS PLACEMENT ([place]) LEFT  DIABETIC;  Surgeon: Eulogio Bear, MD;  Location: [place];  Service: Ophthalmology;  Laterality: Left;  Diabetic - insulin   CATARACT EXTRACTION W/PHACO Right 01/14/2018   Procedure: CATARACT EXTRACTION PHACO AND INTRAOCULAR LENS PLACEMENT ([facility]) RIGHT DIABETIC;  Surgeon: Eulogio Bear, MD;  Location: [place];  Service: Ophthalmology;  Laterality: Right;  Diabetic - insulin   CERVICAL DISC  SURGERY     CHOLECYSTECTOMY     COLON SURGERY     COLONOSCOPY     COLONOSCOPY WITH PROPOFOL N/A 05/17/2015   Procedure: COLONOSCOPY WITH PROPOFOL;  Surgeon: Manya Silvas, MD;  Location: [facility] ENDOSCOPY;  Service: Endoscopy;  Laterality: N/A;   COLONOSCOPY WITH PROPOFOL N/A 04/01/2018   Procedure: COLONOSCOPY WITH BIOPSY;  Surgeon: [name], MD;  Location: [place];  Service: Endoscopy;  Laterality: N/A;  Diabetic - insulin   EMBOLIZATION N/A 01/24/2021   Procedure: EMBOLIZATION;  Surgeon: Algernon Huxley, MD;  Location: [place] CV LAB;  Service: Cardiovascular;  Laterality: N/A;   ESOPHAGOGASTRODUODENOSCOPY     HERNIA REPAIR     LOWER EXTREMITY VENOGRAPHY Left 01/24/2021   Procedure: LOWER EXTREMITY VENOGRAPHY;  Surgeon: Algernon Huxley, MD;  Location: [place] CV LAB;  Service: Cardiovascular;  Laterality: Left;   POLYPECTOMY N/A 04/01/2018   Procedure: POLYPECTOMY;  Surgeon: [name], MD;  Location: [place];  Service: Endoscopy;  Laterality: N/A;   PORT A CATH INJECTION ([place] HX)     Port has been removed   TUBAL LIGATION      There were no vitals filed for this visit.   Subjective Assessment - 03/22/21 1319     Subjective Pt. reports typically discomfort in  low back region describing it as annoying. She rates LBP as 4/10. Pt states she has not been performing lunges within HEP due to pain and LE weakness. No questions or concerns at this time.    Pertinent History per referring note: "Patient notes that she does have a history of chronic back pain which did worsen after her fall. She also endorses a history of polio. Over the past month she feels that she has been more off balance and has had more falls. She is also noticing worsening weakness in her legs. She rates her pain as an 8/10. Is intermittent, stiff. She denies any numbness, tingling or loss of control of bowel or bladder. " H/o Hx cancer, previous back surgery, DM, vertigo, Aortic  atherosclerosis ([place]).    Limitations Walking;House hold activities;Sitting;Lifting;Standing    Diagnostic tests MRI of lumbar and cervical spine. See imaging history.    Patient Stated Goals Reduce pain and improve balance.    Currently in Pain? Yes    Pain Score 4     Pain Location Back    Pain Orientation Lower    Pain Descriptors / Indicators Aching    Pain Type Acute pain    Pain Onset More than a month ago    Pain Frequency Constant              INTERVENTIONS  There.ex:  Nustep L4 for 10 min. B UE/LE for warm up and generalized strengthening.   STS from gray chair/ transfers 10x with no UE assist.  SBA for safety.                Walking lunges x15 BLE.  BUE assist in //-bars for safety/ balance.      Neuro Re-Ed:    Walking in hallway 7 minutes; VC on reciprocal arm swing. Diminished arm swing and stability during dual-task of conversation.   Tandem Airex: lateral walking in //-bars (VC for no UE assist and proper technique/posture). CGA to stabilize.   High marching/ lateral marches in // bars: x4 laps/direction              Alternating toe taps to 6" step; x20 BLE.  Step ups to 6" step with no UE support. Heavy CGA-MIN A for pt safety and stabilization, VC to "power up" through concentric contraction. Occasional UE support when stepping down to pt appearing to "freeze" accompanied by posterior trunk lean.  Moderate fatigue walking out to car after tx - pt requiring HHA for support.    Clinical Impression: Pt demonstrates excellent motivation throughout today's treatment session. Pt was challenged by lunges and step-ups due to fatigue and weakness within quads. Pt had multiple minor LOB during ambulation in hallway requiring CGA and pt occasionally reaching out to wall. Pt educated on safety concerns and rec to use AD when walking - pt states she holds onto husband. PT continued to encourage minor use of UE support during exercises to challenge standing stability  and LE strength. Firm CGA-MIN A required at all times for pt safety. At end of session, pt required HHA to walk out to car. Pt. will continue to benefit from LE strengthening ex. program and focus on dynamic balance/ gait tasks to improve safety/ prevent falls.                                PT Long Term Goals - 03/17/21 5300  PT LONG TERM GOAL #1   Title Pt will improve MMT scores by 1/2 grade to promote functional strength gains for transfers, amb, and decreased caregiver burden.    Baseline 4+/4 Hip flexion  4/4-  Hip abduction  4+/4  Hip adduction  4+/4  Knee extension  4/4 Knee flexion 4/4 Ankle dorsiflexion  4/4 Ankle plantarflexion    Time 4    Period Weeks    Status Partially Met    Target Date 04/12/21      PT LONG TERM GOAL #2   Title Pt will improve BERG score by 5 pts to promote safety with ambulation and functional tasks.    Baseline BERG: 18/56.  11/29: 40/56    Time 4    Period Weeks    Status Partially Met    Target Date 04/12/21      PT LONG TERM GOAL #3   Title Pt will improve FOTO score to predicted improvement value of 56 to measure self reported improvement in functional mobility.    Baseline Initial eval: 36.   11/7: 54 (decrease in back pain but limitations in safe ambulation).    Time 4    Period Weeks    Status Partially Met    Target Date 04/12/21      PT LONG TERM GOAL #4   Title Pt will demonstrates safety with ambulation using FWW with no LOB and only occ verbal cueing and SBA with 10 minutes of overground walking.    Baseline Pt amb 1 min with extensive verbal cueing CGA-min A.    Time 8    Period Weeks    Status Not Met    Target Date 04/12/21      PT LONG TERM GOAL #5   Title Pt will decrease worse pain to 7/10 or less to promote greater access to functional mobility.    Baseline Present 7/10, Best 4/10, Worst 10/10.  11/29: <3/10 back pain    Time 4    Period Weeks    Status Achieved    Target Date 03/15/21                    Plan - 03/22/21 1441     Clinical Impression Statement Pt demonstrates excellent motivation throughout today's treatment session. Pt was challenged by lunges and step-ups due to fatigue and weakness within quads. Pt had multiple minor LOB during ambulation in hallway requiring CGA and pt occasionally reaching out to wall. Pt educated on safety concerns and rec to use AD when walking - pt states she holds onto husband. PT continued to encourage minor use of UE support during exercises to challenge standing stability and LE strength. Firm CGA-MIN A required at all times for pt safety. At end of session, pt required HHA to walk out to car. Pt. will continue to benefit from LE strengthening ex. program and focus on dynamic balance/ gait tasks to improve safety/ prevent falls.    Personal Factors and Comorbidities Comorbidity 3+;Age;Transportation;Fitness    Comorbidities Hx cancer, previous back surgery, DM, vertigo, Aortic atherosclerosis ([place])    Examination-Activity Limitations Stairs;Reach Overhead;Stand;Dressing;Sit;Transfers;Lift;Caring for Others;Carry;Locomotion Level;Squat    Examination-Participation Restrictions Interpersonal Relationship;Cleaning;Laundry;Yard Work;Driving;Meal Prep;Shop    Stability/Clinical Decision Making Evolving/Moderate complexity    Rehab Potential Fair    PT Frequency 2x / week    PT Duration 4 weeks    PT Treatment/Interventions ADLs/Self Care Home Management;Aquatic Therapy;Neuromuscular re-education;Balance training;Therapeutic exercise;Therapeutic activities;Functional mobility training;Stair training;Gait training;DME Instruction;Patient/family education;Manual techniques;Energy conservation;Biofeedback;Electrical Stimulation;Moist  Heat;Traction;Ultrasound;Dry needling;Passive range of motion;Vestibular;Vasopneumatic Device;Spinal Manipulations;Joint Manipulations    PT Next Visit Plan Dynamic balance/ gait activities.    Consulted and  Agree with Plan of Care Patient             Patient will benefit from skilled therapeutic intervention in order to improve the following deficits and impairments:  Abnormal gait, Decreased endurance, Decreased activity tolerance, Decreased strength, Pain, Difficulty walking, Decreased mobility, Decreased balance, Decreased range of motion, Improper body mechanics, Postural dysfunction, Decreased safety awareness, Decreased coordination, Decreased knowledge of precautions, Decreased knowledge of use of DME, Dizziness, Hypomobility, Impaired flexibility, Impaired sensation, Impaired perceived functional ability  Visit Diagnosis: Bilateral low back pain, unspecified chronicity, unspecified whether sciatica present  At high risk for injury related to fall  Bilateral leg weakness  Closed fracture of sacrum and coccyx, sequela  Polio osteopathy of lower leg, left [facility])     Problem List Patient Active Problem List   Diagnosis Date Noted   Aortic atherosclerosis ([place]) 12/29/2020   Deformity of toe of left foot 03/28/2020   H/O multiple pulmonary nodules 09/22/2019   Chronic cough 09/22/2019   Perennial allergic rhinitis 09/17/2019   Acute upper respiratory infection 08/28/2019   Weakness 08/28/2019   Acute bronchitis 06/01/2019   Cough productive of yellow sputum 06/01/2019   Vasomotor rhinitis 05/01/2019   Chronic kidney disease, stage II (mild) 03/14/2019   Gastroesophageal reflux disease without esophagitis 03/14/2019   Intractable episodic headache 03/14/2019   Need for prophylactic vaccination with combined diphtheria-tetanus-pertussis (DTaP) vaccine 03/14/2019   Renal cyst 11/27/2018   Abnormality of gait and mobility 11/27/2018   Hepatic steatosis 10/31/2018   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[place]) 10/31/2018   Elevated ferritin 10/31/2018   Vertigo 08/13/2018   Gastroenteritis 05/09/2018   Diarrhea 05/09/2018   Nausea 05/09/2018   Personal history of colon cancer     Polyp of sigmoid colon    Dysuria 02/23/2018   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 02/10/2018   Encounter for general adult medical examination with abnormal findings 02/10/2018   Acute non-recurrent pansinusitis 08/24/2017   Other headache syndrome 08/24/2017   Uncontrolled type 2 diabetes mellitus with hyperglycemia ([place]) 08/24/2017   Type 2 diabetes mellitus without complication, with long-term current use of insulin ([place]) 08/22/2017   Need for vaccination against Streptococcus pneumoniae using pneumococcal conjugate vaccine 13 08/22/2017   Essential hypertension 05/03/2017   Otalgia, bilateral 05/02/2017   Type 2 diabetes mellitus with hyperglycemia ([place]) 05/02/2017   Mixed hyperlipidemia 05/02/2017   Right lower quadrant pain 06/29/2016   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   History of colon cancer 04/13/2015   Cancer of ascending colon ([place]) 10/12/2014    Patrina Levering PT, DPT  [facility] [address] [place], [place], [address] Phone: [phone]   Fax:  [phone]  Name: Tiffany Hawkins MRN: [id number] Date of Birth: 27-Feb-1946

## 2021-03-24 ENCOUNTER — Other Ambulatory Visit: Payer: Self-pay

## 2021-03-24 ENCOUNTER — Ambulatory Visit: Payer: Medicare HMO | Admitting: Physical Therapy

## 2021-03-24 ENCOUNTER — Encounter: Payer: Self-pay | Admitting: Physical Therapy

## 2021-03-24 DIAGNOSIS — M545 Low back pain, unspecified: Secondary | ICD-10-CM

## 2021-03-24 DIAGNOSIS — M89662 Osteopathy after poliomyelitis, left lower leg: Secondary | ICD-10-CM | POA: Diagnosis not present

## 2021-03-24 DIAGNOSIS — R29898 Other symptoms and signs involving the musculoskeletal system: Secondary | ICD-10-CM

## 2021-03-24 DIAGNOSIS — Z9181 History of falling: Secondary | ICD-10-CM

## 2021-03-24 DIAGNOSIS — S322XXS Fracture of coccyx, sequela: Secondary | ICD-10-CM | POA: Diagnosis not present

## 2021-03-24 DIAGNOSIS — B91 Sequelae of poliomyelitis: Secondary | ICD-10-CM | POA: Diagnosis not present

## 2021-03-24 DIAGNOSIS — S3210XS Unspecified fracture of sacrum, sequela: Secondary | ICD-10-CM

## 2021-03-24 NOTE — Therapy (Signed)
[facility] [facility] [address] [place], [place], [address] Phone: [phone]   Fax:  [phone]  Physical Therapy Treatment  Patient Details  Name: Tiffany Hawkins MRN: [id number] Date of Birth: [birth year] Referring Provider (PT): Girtha Hake MD   Encounter Date: 03/24/2021   PT End of Session - 03/27/21 1816     Visit Number 18    Number of Visits 22    Date for PT Re-Evaluation 04/12/21    Authorization - Visit Number 7    Authorization - Number of Visits 10    Progress Note Due on Visit 10    PT Start Time 1426    PT Stop Time 1516    PT Time Calculation (min) 50 min    Equipment Utilized During Treatment Gait belt   4WW and FWW   Activity Tolerance Patient tolerated treatment well;Patient limited by fatigue   increased unsteadiness after BPPV treatment.   Behavior During Therapy [facility] for tasks assessed/performed             Past Medical History:  Diagnosis Date   Anxiety    Colon cancer ([place]) 10/12/2014   Stage IIB; had chemo   Diabetes mellitus without complication (HCC)    Hyperlipidemia    Hypertension    Lateral epicondylitis    Personal history of chemotherapy    Polio    childhood   Skin cancer    Sqamous Cell, Lt Calf   Sleep apnea    no CPAP, sx improved with wt loss    Past Surgical History:  Procedure Laterality Date   APPENDECTOMY     BACK SURGERY     CATARACT EXTRACTION W/PHACO Left 12/03/2017   Procedure: CATARACT EXTRACTION PHACO AND INTRAOCULAR LENS PLACEMENT ([place]) LEFT  DIABETIC;  Surgeon: Eulogio Bear, MD;  Location: [place];  Service: Ophthalmology;  Laterality: Left;  Diabetic - insulin   CATARACT EXTRACTION W/PHACO Right 01/14/2018   Procedure: CATARACT EXTRACTION PHACO AND INTRAOCULAR LENS PLACEMENT ([facility]) RIGHT DIABETIC;  Surgeon: Eulogio Bear, MD;  Location: [place];  Service: Ophthalmology;  Laterality: Right;  Diabetic - insulin   CERVICAL DISC  SURGERY     CHOLECYSTECTOMY     COLON SURGERY     COLONOSCOPY     COLONOSCOPY WITH PROPOFOL N/A 05/17/2015   Procedure: COLONOSCOPY WITH PROPOFOL;  Surgeon: Manya Silvas, MD;  Location: [facility] ENDOSCOPY;  Service: Endoscopy;  Laterality: N/A;   COLONOSCOPY WITH PROPOFOL N/A 04/01/2018   Procedure: COLONOSCOPY WITH BIOPSY;  Surgeon: [name], MD;  Location: [place];  Service: Endoscopy;  Laterality: N/A;  Diabetic - insulin   EMBOLIZATION N/A 01/24/2021   Procedure: EMBOLIZATION;  Surgeon: Algernon Huxley, MD;  Location: [place] CV LAB;  Service: Cardiovascular;  Laterality: N/A;   ESOPHAGOGASTRODUODENOSCOPY     HERNIA REPAIR     LOWER EXTREMITY VENOGRAPHY Left 01/24/2021   Procedure: LOWER EXTREMITY VENOGRAPHY;  Surgeon: Algernon Huxley, MD;  Location: [place] CV LAB;  Service: Cardiovascular;  Laterality: Left;   POLYPECTOMY N/A 04/01/2018   Procedure: POLYPECTOMY;  Surgeon: [name], MD;  Location: [place];  Service: Endoscopy;  Laterality: N/A;   PORT A CATH INJECTION ([place] HX)     Port has been removed   TUBAL LIGATION      There were no vitals filed for this visit.   Subjective Assessment - 03/27/21 1814     Subjective Pt. reports no new complaints.  Pt. entered PT with no assistive device.    Pertinent History per referring note: "Patient notes that she does have a history of chronic back pain which did worsen after her fall. She also endorses a history of polio. Over the past month she feels that she has been more off balance and has had more falls. She is also noticing worsening weakness in her legs. She rates her pain as an 8/10. Is intermittent, stiff. She denies any numbness, tingling or loss of control of bowel or bladder. " H/o Hx cancer, previous back surgery, DM, vertigo, Aortic atherosclerosis ([place]).    Limitations Walking;House hold activities;Sitting;Lifting;Standing    Diagnostic tests MRI of lumbar and cervical spine. See imaging  history.    Patient Stated Goals Reduce pain and improve balance.    Currently in Pain? Yes    Pain Score 2     Pain Location Back    Pain Orientation Lower    Pain Onset More than a month ago                There.ex:   Nustep L4 for 10 min. B UE/LE for warm up and generalized strengthening.  STS from blue mat table 10x with no UE assist.  SBA for safety.  EC sit to stand 2x (min. A).      Standing 4# ankle wts.: high marching/ lateral marches in // bars: x3 laps/direction.  Seated LAQ 20x each.              Walking lunges x15 BLE.  BUE assist in //-bars for safety/ balance.      Neuro Re-Ed:    Standing Airex: step touches (1st/ 2nd)/ ups 20x (min. To no UE assist).    Walking hurdles with mirror feedback in //-bars (6" hurdles).    Walking in hallway/ outside; VC on reciprocal arm swing. Increase assist/ guarding while walking on grassy terrain/ curbs.            PT Long Term Goals - 03/17/21 0816       PT LONG TERM GOAL #1   Title Pt will improve MMT scores by 1/2 grade to promote functional strength gains for transfers, amb, and decreased caregiver burden.    Baseline 4+/4 Hip flexion  4/4-  Hip abduction  4+/4  Hip adduction  4+/4  Knee extension  4/4 Knee flexion 4/4 Ankle dorsiflexion  4/4 Ankle plantarflexion    Time 4    Period Weeks    Status Partially Met    Target Date 04/12/21      PT LONG TERM GOAL #2   Title Pt will improve BERG score by 5 pts to promote safety with ambulation and functional tasks.    Baseline BERG: 18/56.  11/29: 40/56    Time 4    Period Weeks    Status Partially Met    Target Date 04/12/21      PT LONG TERM GOAL #3   Title Pt will improve FOTO score to predicted improvement value of 56 to measure self reported improvement in functional mobility.    Baseline Initial eval: 36.   11/7: 54 (decrease in back pain but limitations in safe ambulation).    Time 4    Period Weeks    Status Partially Met    Target Date 04/12/21       PT LONG TERM GOAL #4   Title Pt will demonstrates safety with ambulation using FWW with no LOB and only occ verbal cueing  and SBA with 10 minutes of overground walking.    Baseline Pt amb 1 min with extensive verbal cueing CGA-min A.    Time 8    Period Weeks    Status Not Met    Target Date 04/12/21      PT LONG TERM GOAL #5   Title Pt will decrease worse pain to 7/10 or less to promote greater access to functional mobility.    Baseline Present 7/10, Best 4/10, Worst 10/10.  11/29: <3/10 back pain    Time 4    Period Weeks    Status Achieved    Target Date 03/15/21                   Plan - 03/27/21 1816     Clinical Impression Statement Pt. requires more CGA/min. A as tx. progresses due to moderate LE muscle fatigue.  Pt. requires mod. cuing to increase step pattern/ hip flexion and avoid a staggering gait pattern while in hallway/ outside.  Pt. benefits from use of SPC with 2-point gait pattern to improve safety.  Pt. encouraged to remain compliant with HEP and daily walking.    Personal Factors and Comorbidities Comorbidity 3+;Age;Transportation;Fitness    Comorbidities Hx cancer, previous back surgery, DM, vertigo, Aortic atherosclerosis ([place])    Examination-Activity Limitations Stairs;Reach Overhead;Stand;Dressing;Sit;Transfers;Lift;Caring for Others;Carry;Locomotion Level;Squat    Examination-Participation Restrictions Interpersonal Relationship;Cleaning;Laundry;Yard Work;Driving;Meal Prep;Shop    Stability/Clinical Decision Making Evolving/Moderate complexity    Clinical Decision Making Moderate    Rehab Potential Fair    PT Frequency 2x / week    PT Duration 4 weeks    PT Treatment/Interventions ADLs/Self Care Home Management;Aquatic Therapy;Neuromuscular re-education;Balance training;Therapeutic exercise;Therapeutic activities;Functional mobility training;Stair training;Gait training;DME Instruction;Patient/family education;Manual techniques;Energy  conservation;Biofeedback;Electrical Stimulation;Moist Heat;Traction;Ultrasound;Dry needling;Passive range of motion;Vestibular;Vasopneumatic Device;Spinal Manipulations;Joint Manipulations    PT Next Visit Plan Dynamic balance/ gait activities.    Consulted and Agree with Plan of Care Patient             Patient will benefit from skilled therapeutic intervention in order to improve the following deficits and impairments:  Abnormal gait, Decreased endurance, Decreased activity tolerance, Decreased strength, Pain, Difficulty walking, Decreased mobility, Decreased balance, Decreased range of motion, Improper body mechanics, Postural dysfunction, Decreased safety awareness, Decreased coordination, Decreased knowledge of precautions, Decreased knowledge of use of DME, Dizziness, Hypomobility, Impaired flexibility, Impaired sensation, Impaired perceived functional ability  Visit Diagnosis: Bilateral low back pain, unspecified chronicity, unspecified whether sciatica present  At high risk for injury related to fall  Bilateral leg weakness  Closed fracture of sacrum and coccyx, sequela  Polio osteopathy of lower leg, left ([place])     Problem List Patient Active Problem List   Diagnosis Date Noted   Aortic atherosclerosis ([place]) 12/29/2020   Deformity of toe of left foot 03/28/2020   H/O multiple pulmonary nodules 09/22/2019   Chronic cough 09/22/2019   Perennial allergic rhinitis 09/17/2019   Acute upper respiratory infection 08/28/2019   Weakness 08/28/2019   Acute bronchitis 06/01/2019   Cough productive of yellow sputum 06/01/2019   Vasomotor rhinitis 05/01/2019   Chronic kidney disease, stage II (mild) 03/14/2019   Gastroesophageal reflux disease without esophagitis 03/14/2019   Intractable episodic headache 03/14/2019   Need for prophylactic vaccination with combined diphtheria-tetanus-pertussis (DTaP) vaccine 03/14/2019   Renal cyst 11/27/2018   Abnormality of gait and mobility  11/27/2018   Hepatic steatosis 10/31/2018   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[place]) 10/31/2018   Elevated ferritin 10/31/2018   Vertigo 08/13/2018  Gastroenteritis 05/09/2018   Diarrhea 05/09/2018   Nausea 05/09/2018   Personal history of colon cancer    Polyp of sigmoid colon    Dysuria 02/23/2018   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 02/10/2018   Encounter for general adult medical examination with abnormal findings 02/10/2018   Acute non-recurrent pansinusitis 08/24/2017   Other headache syndrome 08/24/2017   Uncontrolled type 2 diabetes mellitus with hyperglycemia ([place]) 08/24/2017   Type 2 diabetes mellitus without complication, with long-term current use of insulin ([place]) 08/22/2017   Need for vaccination against Streptococcus pneumoniae using pneumococcal conjugate vaccine 13 08/22/2017   Essential hypertension 05/03/2017   Otalgia, bilateral 05/02/2017   Type 2 diabetes mellitus with hyperglycemia ([place]) 05/02/2017   Mixed hyperlipidemia 05/02/2017   Right lower quadrant pain 06/29/2016   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   History of colon cancer 04/13/2015   Cancer of ascending colon ([place]) 10/12/2014   Pura Spice, PT, DPT # [phone] 03/27/2021, 6:20 PM  [place] [facility] [address] [place], [place], [address] Phone: [phone]   Fax:  [phone]  Name: SHARDAI STAR MRN: [id number] Date of Birth: Nov 12, 1945

## 2021-03-25 ENCOUNTER — Telehealth: Payer: Self-pay

## 2021-03-25 NOTE — Telephone Encounter (Signed)
Faxed paperwork for patient Asst program to [employer] for [place] [employer] 70/30 03/25/21

## 2021-03-29 ENCOUNTER — Encounter: Payer: Self-pay | Admitting: Physical Therapy

## 2021-03-29 ENCOUNTER — Ambulatory Visit: Payer: Medicare HMO | Admitting: Physical Therapy

## 2021-03-29 ENCOUNTER — Other Ambulatory Visit: Payer: Self-pay

## 2021-03-29 DIAGNOSIS — R29898 Other symptoms and signs involving the musculoskeletal system: Secondary | ICD-10-CM | POA: Diagnosis not present

## 2021-03-29 DIAGNOSIS — Z9181 History of falling: Secondary | ICD-10-CM | POA: Diagnosis not present

## 2021-03-29 DIAGNOSIS — M89662 Osteopathy after poliomyelitis, left lower leg: Secondary | ICD-10-CM | POA: Diagnosis not present

## 2021-03-29 DIAGNOSIS — S322XXS Fracture of coccyx, sequela: Secondary | ICD-10-CM

## 2021-03-29 DIAGNOSIS — M545 Low back pain, unspecified: Secondary | ICD-10-CM

## 2021-03-29 DIAGNOSIS — B91 Sequelae of poliomyelitis: Secondary | ICD-10-CM | POA: Diagnosis not present

## 2021-03-29 DIAGNOSIS — S3210XS Unspecified fracture of sacrum, sequela: Secondary | ICD-10-CM | POA: Diagnosis not present

## 2021-03-29 NOTE — Therapy (Signed)
[facility] [address] [place], [place], [address] Phone: [phone]   Fax:  [phone]  Physical Therapy Treatment  Patient Details  Name: Tiffany Hawkins MRN: [id number] Date of Birth: February 14, 1946 Referring Provider (PT): Girtha Hake MD   Encounter Date: 03/29/2021   PT End of Session - 03/29/21 1340     Visit Number 19    Number of Visits 22    Date for PT Re-Evaluation 04/12/21    Authorization - Visit Number 8    Authorization - Number of Visits 10    Progress Note Due on Visit 10    PT Start Time 1340    PT Stop Time 1434    PT Time Calculation (min) 54 min    Equipment Utilized During Treatment Gait belt   4WW and FWW   Activity Tolerance Patient tolerated treatment well;Patient limited by fatigue   increased unsteadiness after BPPV treatment.   Behavior During Therapy [facility] for tasks assessed/performed             Past Medical History:  Diagnosis Date   Anxiety    Colon cancer ([place]) 10/12/2014   Stage IIB; had chemo   Diabetes mellitus without complication (HCC)    Hyperlipidemia    Hypertension    Lateral epicondylitis    Personal history of chemotherapy    Polio    childhood   Skin cancer    Sqamous Cell, Lt Calf   Sleep apnea    no CPAP, sx improved with wt loss    Past Surgical History:  Procedure Laterality Date   APPENDECTOMY     BACK SURGERY     CATARACT EXTRACTION W/PHACO Left 12/03/2017   Procedure: CATARACT EXTRACTION PHACO AND INTRAOCULAR LENS PLACEMENT ([place]) LEFT  DIABETIC;  Surgeon: Eulogio Bear, MD;  Location: [place];  Service: Ophthalmology;  Laterality: Left;  Diabetic - insulin   CATARACT EXTRACTION W/PHACO Right 01/14/2018   Procedure: CATARACT EXTRACTION PHACO AND INTRAOCULAR LENS PLACEMENT ([facility]) RIGHT DIABETIC;  Surgeon: Eulogio Bear, MD;  Location: [place];  Service: Ophthalmology;  Laterality: Right;  Diabetic - insulin   CERVICAL DISC  SURGERY     CHOLECYSTECTOMY     COLON SURGERY     COLONOSCOPY     COLONOSCOPY WITH PROPOFOL N/A 05/17/2015   Procedure: COLONOSCOPY WITH PROPOFOL;  Surgeon: Manya Silvas, MD;  Location: [facility] ENDOSCOPY;  Service: Endoscopy;  Laterality: N/A;   COLONOSCOPY WITH PROPOFOL N/A 04/01/2018   Procedure: COLONOSCOPY WITH BIOPSY;  Surgeon: [name], MD;  Location: [place];  Service: Endoscopy;  Laterality: N/A;  Diabetic - insulin   EMBOLIZATION N/A 01/24/2021   Procedure: EMBOLIZATION;  Surgeon: Algernon Huxley, MD;  Location: [place] CV LAB;  Service: Cardiovascular;  Laterality: N/A;   ESOPHAGOGASTRODUODENOSCOPY     HERNIA REPAIR     LOWER EXTREMITY VENOGRAPHY Left 01/24/2021   Procedure: LOWER EXTREMITY VENOGRAPHY;  Surgeon: Algernon Huxley, MD;  Location: [place] CV LAB;  Service: Cardiovascular;  Laterality: Left;   POLYPECTOMY N/A 04/01/2018   Procedure: POLYPECTOMY;  Surgeon: [name], MD;  Location: [place];  Service: Endoscopy;  Laterality: N/A;   PORT A CATH INJECTION ([place] H[facility])     Port has been removed   TUBAL LIGATION      There were no vitals filed for this visit.   Subjective Assessment - 03/29/21 1338     Subjective Pt. states she is not  doing well this morning.  Pt. reports being off balance but no falls reported.  No pain reported.    Pertinent History per referring note: "Patient notes that she does have a history of chronic back pain which did worsen after her fall. She also endorses a history of polio. Over the past month she feels that she has been more off balance and has had more falls. She is also noticing worsening weakness in her legs. She rates her pain as an 8/10. Is intermittent, stiff. She denies any numbness, tingling or loss of control of bowel or bladder. " H/o Hx cancer, previous back surgery, DM, vertigo, Aortic atherosclerosis ([place]).    Limitations Walking;House hold activities;Sitting;Lifting;Standing    Diagnostic tests  MRI of lumbar and cervical spine. See imaging history.    Patient Stated Goals Reduce pain and improve balance.    Currently in Pain? No/denies    Pain Onset More than a month ago             Increase staggered gait with L LE wt. Bearing/ R swing through phase of gait.     There.ex:   Nustep L4 for 10 min. B UE/LE (warm-up/ discussed weekend activities).    Step ups on L/R 10x each (requires L UE assist during R LE step ups).     Standing 4# ankle wts.: high marching/ lateral marches in // bars: x3 laps/direction.  Seated LAQ/ heel raises 20x each.  Hypervolt to B UT in seated posture at end of tx.       Neuro Re-Ed:   Walking in hallway with Airex pads/ head turns (70 feet x 4).  Cuing to increase BOS/ step pattern/ length.  Pt. Tends to decrease step pattern when stepping up on Airex or turning.  Moderate LE muscle fatigue noted with increase distance walked.    Step ups/ downs in //-bars with 6" step.      Walking in hallway/ outside; VC on reciprocal arm swing. Increase assist/ guarding while walking on grassy terrain and transitioning to sidewalk/curb         PT Long Term Goals - 03/17/21 0816       PT LONG TERM GOAL #1   Title Pt will improve MMT scores by 1/2 grade to promote functional strength gains for transfers, amb, and decreased caregiver burden.    Baseline 4+/4 Hip flexion  4/4-  Hip abduction  4+/4  Hip adduction  4+/4  Knee extension  4/4 Knee flexion 4/4 Ankle dorsiflexion  4/4 Ankle plantarflexion    Time 4    Period Weeks    Status Partially Met    Target Date 04/12/21      PT LONG TERM GOAL #2   Title Pt will improve BERG score by 5 pts to promote safety with ambulation and functional tasks.    Baseline BERG: 18/56.  11/29: 40/56    Time 4    Period Weeks    Status Partially Met    Target Date 04/12/21      PT LONG TERM GOAL #3   Title Pt will improve FOTO score to predicted improvement value of 56 to measure self reported improvement in  functional mobility.    Baseline Initial eval: 36.   11/7: 54 (decrease in back pain but limitations in safe ambulation).    Time 4    Period Weeks    Status Partially Met    Target Date 04/12/21      PT LONG TERM GOAL #  4   Title Pt will demonstrates safety with ambulation using FWW with no LOB and only occ verbal cueing and SBA with 10 minutes of overground walking.    Baseline Pt amb 1 min with extensive verbal cueing CGA-min A.    Time 8    Period Weeks    Status Not Met    Target Date 04/12/21      PT LONG TERM GOAL #5   Title Pt will decrease worse pain to 7/10 or less to promote greater access to functional mobility.    Baseline Present 7/10, Best 4/10, Worst 10/10.  11/29: <3/10 back pain    Time 4    Period Weeks    Status Achieved    Target Date 03/15/21                   Plan - 03/29/21 1340     Clinical Impression Statement Moderate LE muscle fatigue during/after tx. session resulting in a more staggered gait with shorter stridelength, esp. with outside walking on grass/ sidewalk.  Min. A with all dynamic balance/ gait activities due to pts. staggered gait during L LE stance/ R swing through phase of gait.  Light UE assist required with all ther.ex. for balance/ safety. Pt. required frequent cuing to correct upright posture/ head position during walking and standing ther.ex. CGA/min. A for safety and verbal t/o tx. session. Pt. will continue to benefit from LE strengthening ex. program and focus on dynamic balance/ gait tasks to improve safety/ prevent falls. Pt. encouraged to use SPC or RW with daily walking, esp. walking outside/ into church.    Personal Factors and Comorbidities Comorbidity 3+;Age;Transportation;Fitness    Comorbidities Hx cancer, previous back surgery, DM, vertigo, Aortic atherosclerosis ([place])    Examination-Activity Limitations Stairs;Reach Overhead;Stand;Dressing;Sit;Transfers;Lift;Caring for Others;Carry;Locomotion Level;Squat     Examination-Participation Restrictions Interpersonal Relationship;Cleaning;Laundry;Yard Work;Driving;Meal Prep;Shop    Stability/Clinical Decision Making Evolving/Moderate complexity    Clinical Decision Making Moderate    Rehab Potential Fair    PT Frequency 2x / week    PT Duration 4 weeks    PT Treatment/Interventions ADLs/Self Care Home Management;Aquatic Therapy;Neuromuscular re-education;Balance training;Therapeutic exercise;Therapeutic activities;Functional mobility training;Stair training;Gait training;DME Instruction;Patient/family education;Manual techniques;Energy conservation;Biofeedback;Electrical Stimulation;Moist Heat;Traction;Ultrasound;Dry needling;Passive range of motion;Vestibular;Vasopneumatic Device;Spinal Manipulations;Joint Manipulations    PT Next Visit Plan Dynamic balance/ gait activities.    Consulted and Agree with Plan of Care Patient             Patient will benefit from skilled therapeutic intervention in order to improve the following deficits and impairments:  Abnormal gait, Decreased endurance, Decreased activity tolerance, Decreased strength, Pain, Difficulty walking, Decreased mobility, Decreased balance, Decreased range of motion, Improper body mechanics, Postural dysfunction, Decreased safety awareness, Decreased coordination, Decreased knowledge of precautions, Decreased knowledge of use of DME, Dizziness, Hypomobility, Impaired flexibility, Impaired sensation, Impaired perceived functional ability  Visit Diagnosis: Bilateral low back pain, unspecified chronicity, unspecified whether sciatica present  At high risk for injury related to fall  Bilateral leg weakness  Closed fracture of sacrum and coccyx, sequela  Polio osteopathy of lower leg, left [facility])     Problem List Patient Active Problem List   Diagnosis Date Noted   Aortic atherosclerosis ([place]) 12/29/2020   Deformity of toe of left foot 03/28/2020   H/O multiple pulmonary nodules  09/22/2019   Chronic cough 09/22/2019   Perennial allergic rhinitis 09/17/2019   Acute upper respiratory infection 08/28/2019   Weakness 08/28/2019   Acute bronchitis 06/01/2019   Cough productive of  yellow sputum 06/01/2019   Vasomotor rhinitis 05/01/2019   Chronic kidney disease, stage II (mild) 03/14/2019   Gastroesophageal reflux disease without esophagitis 03/14/2019   Intractable episodic headache 03/14/2019   Need for prophylactic vaccination with combined diphtheria-tetanus-pertussis (DTaP) vaccine 03/14/2019   Renal cyst 11/27/2018   Abnormality of gait and mobility 11/27/2018   Hepatic steatosis 10/31/2018   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[place]) 10/31/2018   Elevated ferritin 10/31/2018   Vertigo 08/13/2018   Gastroenteritis 05/09/2018   Diarrhea 05/09/2018   Nausea 05/09/2018   Personal history of colon cancer    Polyp of sigmoid colon    Dysuria 02/23/2018   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 02/10/2018   Encounter for general adult medical examination with abnormal findings 02/10/2018   Acute non-recurrent pansinusitis 08/24/2017   Other headache syndrome 08/24/2017   Uncontrolled type 2 diabetes mellitus with hyperglycemia ([place]) 08/24/2017   Type 2 diabetes mellitus without complication, with long-term current use of insulin ([place]) 08/22/2017   Need for vaccination against Streptococcus pneumoniae using pneumococcal conjugate vaccine 13 08/22/2017   Essential hypertension 05/03/2017   Otalgia, bilateral 05/02/2017   Type 2 diabetes mellitus with hyperglycemia ([place]) 05/02/2017   Mixed hyperlipidemia 05/02/2017   Right lower quadrant pain 06/29/2016   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   History of colon cancer 04/13/2015   Cancer of ascending colon ([place]) 10/12/2014   Pura Spice, PT, DPT # [id number] 03/30/2021, 1:55 PM  [facility] [address] [place],  [place], [address] Phone: [phone]   Fax:  [phone]  Name: LILJA SOLAND MRN: [id number] Date of Birth: 07/03/1945

## 2021-03-31 ENCOUNTER — Encounter: Payer: Self-pay | Admitting: Physical Therapy

## 2021-03-31 ENCOUNTER — Other Ambulatory Visit: Payer: Self-pay

## 2021-03-31 ENCOUNTER — Ambulatory Visit: Payer: Medicare HMO | Admitting: Physical Therapy

## 2021-03-31 DIAGNOSIS — R29898 Other symptoms and signs involving the musculoskeletal system: Secondary | ICD-10-CM | POA: Diagnosis not present

## 2021-03-31 DIAGNOSIS — M89662 Osteopathy after poliomyelitis, left lower leg: Secondary | ICD-10-CM | POA: Diagnosis not present

## 2021-03-31 DIAGNOSIS — Z9181 History of falling: Secondary | ICD-10-CM | POA: Diagnosis not present

## 2021-03-31 DIAGNOSIS — M545 Low back pain, unspecified: Secondary | ICD-10-CM | POA: Diagnosis not present

## 2021-03-31 DIAGNOSIS — B91 Sequelae of poliomyelitis: Secondary | ICD-10-CM

## 2021-03-31 DIAGNOSIS — S3210XS Unspecified fracture of sacrum, sequela: Secondary | ICD-10-CM | POA: Diagnosis not present

## 2021-03-31 DIAGNOSIS — S322XXS Fracture of coccyx, sequela: Secondary | ICD-10-CM | POA: Diagnosis not present

## 2021-03-31 NOTE — Therapy (Signed)
[facility] [address] [place], [place], [address] Phone: [phone]   Fax:  [phone]  Physical Therapy Treatment Physical Therapy Progress Note   Dates of reporting period  02/24/2021  to   03/31/2021  Patient Details  Name: Tiffany Hawkins MRN: [id number] Date of Birth: Aug 21, 1945 Referring Provider (PT): Girtha Hake MD   Encounter Date: 03/31/2021     PT End of Session - 03/31/21 1338     Visit Number 20    Number of Visits 22    Date for PT Re-Evaluation 04/12/21    Authorization - Visit Number 10    Authorization - Number of Visits 10    Progress Note Due on Visit 10    PT Start Time 5597 to [place] During Treatment Gait belt   4WW and FWW   Activity Tolerance Patient tolerated treatment well;Patient limited by fatigue   increased unsteadiness after BPPV treatment.   Behavior During Therapy [facility] for tasks assessed/performed             Past Medical History:  Diagnosis Date   Anxiety    Colon cancer ([place]) 10/12/2014   Stage IIB; had chemo   Diabetes mellitus without complication (HCC)    Hyperlipidemia    Hypertension    Lateral epicondylitis    Personal history of chemotherapy    Polio    childhood   Skin cancer    Sqamous Cell, Lt Calf   Sleep apnea    no CPAP, sx improved with wt loss    Past Surgical History:  Procedure Laterality Date   APPENDECTOMY     BACK SURGERY     CATARACT EXTRACTION W/PHACO Left 12/03/2017   Procedure: CATARACT EXTRACTION PHACO AND INTRAOCULAR LENS PLACEMENT ([place]) LEFT  DIABETIC;  Surgeon: Eulogio Bear, MD;  Location: [place];  Service: Ophthalmology;  Laterality: Left;  Diabetic - insulin   CATARACT EXTRACTION W/PHACO Right 01/14/2018   Procedure: CATARACT EXTRACTION PHACO AND INTRAOCULAR LENS PLACEMENT ([facility]) RIGHT DIABETIC;  Surgeon: Eulogio Bear, MD;  Location: [place];  Service: Ophthalmology;  Laterality:  Right;  Diabetic - insulin   CERVICAL DISC SURGERY     CHOLECYSTECTOMY     COLON SURGERY     COLONOSCOPY     COLONOSCOPY WITH PROPOFOL N/A 05/17/2015   Procedure: COLONOSCOPY WITH PROPOFOL;  Surgeon: Manya Silvas, MD;  Location: [facility] ENDOSCOPY;  Service: Endoscopy;  Laterality: N/A;   COLONOSCOPY WITH PROPOFOL N/A 04/01/2018   Procedure: COLONOSCOPY WITH BIOPSY;  Surgeon: [name], MD;  Location: [place];  Service: Endoscopy;  Laterality: N/A;  Diabetic - insulin   EMBOLIZATION N/A 01/24/2021   Procedure: EMBOLIZATION;  Surgeon: Algernon Huxley, MD;  Location: [place] CV LAB;  Service: Cardiovascular;  Laterality: N/A;   ESOPHAGOGASTRODUODENOSCOPY     HERNIA REPAIR     LOWER EXTREMITY VENOGRAPHY Left 01/24/2021   Procedure: LOWER EXTREMITY VENOGRAPHY;  Surgeon: Algernon Huxley, MD;  Location: [place] CV LAB;  Service: Cardiovascular;  Laterality: Left;   POLYPECTOMY N/A 04/01/2018   Procedure: POLYPECTOMY;  Surgeon: [name], MD;  Location: [place];  Service: Endoscopy;  Laterality: N/A;   PORT A CATH INJECTION ([place] [facility]X)     Port has been removed   TUBAL LIGATION      There were no vitals filed for this visit.   Subjective Assessment - 03/31/21 1338     Subjective Pt.  had a difficult day with low back pain yesterday. Pts. husband rubbed horse linament on back today.    Pertinent History per referring note: "Patient notes that she does have a history of chronic back pain which did worsen after her fall. She also endorses a history of polio. Over the past month she feels that she has been more off balance and has had more falls. She is also noticing worsening weakness in her legs. She rates her pain as an 8/10. Is intermittent, stiff. She denies any numbness, tingling or loss of control of bowel or bladder. " H/o Hx cancer, previous back surgery, DM, vertigo, Aortic atherosclerosis ([place]).    Limitations Walking;House hold  activities;Sitting;Lifting;Standing    Diagnostic tests MRI of lumbar and cervical spine. See imaging history.    Patient Stated Goals Reduce pain and improve balance.    Currently in Pain? Yes    Pain Onset More than a month ago             There.ex:   Nustep L4 for 10 min. B UE/LE (warm-up/ discussed weekend activities).    No ankle wts today.    Seated MH to low back in sitting after Nustep/ discussed weekend activities.       Step ups on L/R 10x each (requires L UE assist during R LE step ups).     Hypervolt to B UT in seated posture at end of tx.       Neuro Re-Ed:    Walking in //-bars with hjgh marching/ braiding/ alt. UE and LE touches.    Simulated unloading dishwasher with 6" step and cones.  Cuing to avoid lumbar flexion and instruction for knee flexion (increase R knee pain).    Moderate LE muscle fatigue noted with increase distance walked.      Walking in hallway.  VC on reciprocal arm swing. Increase assist/ guarding while walking on grassy terrain and transitioning to sidewalk/curb          PT Long Term Goals - 04/02/21 1253       PT LONG TERM GOAL #1   Title Pt will improve MMT scores by 1/2 grade to promote functional strength gains for transfers, amb, and decreased caregiver burden.    Baseline 4+/4 Hip flexion  4/4-  Hip abduction  4+/4  Hip adduction  4+/4  Knee extension  4/4 Knee flexion 4/4 Ankle dorsiflexion  4/4 Ankle plantarflexion    Time 4    Period Weeks    Status Partially Met    Target Date 04/12/21      PT LONG TERM GOAL #2   Title Pt will improve BERG score by 5 pts to promote safety with ambulation and functional tasks.    Baseline BERG: 18/56.  11/29: 40/56    Time 4    Period Weeks    Status Partially Met    Target Date 04/12/21      PT LONG TERM GOAL #3   Title Pt will improve FOTO score to predicted improvement value of 56 to measure self reported improvement in functional mobility.    Baseline Initial eval: 36.    11/7: 54 (decrease in back pain but limitations in safe ambulation).    Time 4    Period Weeks    Status Partially Met    Target Date 04/12/21      PT LONG TERM GOAL #4   Title Pt will demonstrates safety with ambulation using FWW with no LOB and only occ  verbal cueing and SBA with 10 minutes of overground walking.    Baseline Pt amb 1 min with extensive verbal cueing CGA-min A.    Time 8    Period Weeks    Status Not Met    Target Date 04/12/21      PT LONG TERM GOAL #5   Title Pt will decrease worse pain to 7/10 or less to promote greater access to functional mobility.    Baseline Present 7/10, Best 4/10, Worst 10/10.  11/29: <3/10 back pain    Time 4    Period Weeks    Status Achieved    Target Date 03/15/21             See updated goals.  Tx. focus on balance tasks/ coordination while walking and completing functional tasks.  Pt. limited by R knee pain with standing squats/ simulation of emptying dishwasher.  Pt. prefers standing lumbar flexion with floor to waist lifting and educated in proper posture/ technique/ knee flexion.  Pt. benefits from light use of UE in //-bars with all dynamic tasks for safety.  No recent falls but high fall risk remains.         Patient will benefit from skilled therapeutic intervention in order to improve the following deficits and impairments:  Abnormal gait, Decreased endurance, Decreased activity tolerance, Decreased strength, Pain, Difficulty walking, Decreased mobility, Decreased balance, Decreased range of motion, Improper body mechanics, Postural dysfunction, Decreased safety awareness, Decreased coordination, Decreased knowledge of precautions, Decreased knowledge of use of DME, Dizziness, Hypomobility, Impaired flexibility, Impaired sensation, Impaired perceived functional ability  Visit Diagnosis: Bilateral low back pain, unspecified chronicity, unspecified whether sciatica present  At high risk for injury related to  fall  Bilateral leg weakness  Closed fracture of sacrum and coccyx, sequela  Polio osteopathy of lower leg, left ([place])     Problem List Patient Active Problem List   Diagnosis Date Noted   Aortic atherosclerosis ([place]) 12/29/2020   Deformity of toe of left foot 03/28/2020   H/O multiple pulmonary nodules 09/22/2019   Chronic cough 09/22/2019   Perennial allergic rhinitis 09/17/2019   Acute upper respiratory infection 08/28/2019   Weakness 08/28/2019   Acute bronchitis 06/01/2019   Cough productive of yellow sputum 06/01/2019   Vasomotor rhinitis 05/01/2019   Chronic kidney disease, stage II (mild) 03/14/2019   Gastroesophageal reflux disease without esophagitis 03/14/2019   Intractable episodic headache 03/14/2019   Need for prophylactic vaccination with combined diphtheria-tetanus-pertussis (DTaP) vaccine 03/14/2019   Renal cyst 11/27/2018   Abnormality of gait and mobility 11/27/2018   Hepatic steatosis 10/31/2018   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[place]) 10/31/2018   Elevated ferritin 10/31/2018   Vertigo 08/13/2018   Gastroenteritis 05/09/2018   Diarrhea 05/09/2018   Nausea 05/09/2018   Personal history of colon cancer    Polyp of sigmoid colon    Dysuria 02/23/2018   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 02/10/2018   Encounter for general adult medical examination with abnormal findings 02/10/2018   Acute non-recurrent pansinusitis 08/24/2017   Other headache syndrome 08/24/2017   Uncontrolled type 2 diabetes mellitus with hyperglycemia ([place]) 08/24/2017   Type 2 diabetes mellitus without complication, with long-term current use of insulin ([place]) 08/22/2017   Need for vaccination against Streptococcus pneumoniae using pneumococcal conjugate vaccine 13 08/22/2017   Essential hypertension 05/03/2017   Otalgia, bilateral 05/02/2017   Type 2 diabetes mellitus with hyperglycemia ([place]) 05/02/2017   Mixed hyperlipidemia 05/02/2017   Right lower quadrant  pain  06/29/2016   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   History of colon cancer 04/13/2015   Cancer of ascending colon ([place]) 10/12/2014   Pura Spice, PT, DPT # [id number] 04/02/2021, 12:54 PM  [facility] [address] [place], [place], [address] Phone: [phone]   Fax:  [phone]  Name: Tiffany Hawkins MRN: [id number] Date of Birth: 14-Apr-1946

## 2021-04-04 ENCOUNTER — Encounter: Payer: Self-pay | Admitting: Nurse Practitioner

## 2021-04-05 ENCOUNTER — Other Ambulatory Visit: Payer: Self-pay

## 2021-04-05 ENCOUNTER — Ambulatory Visit: Payer: Medicare HMO | Admitting: Physical Therapy

## 2021-04-05 DIAGNOSIS — S322XXS Fracture of coccyx, sequela: Secondary | ICD-10-CM

## 2021-04-05 DIAGNOSIS — M545 Low back pain, unspecified: Secondary | ICD-10-CM

## 2021-04-05 DIAGNOSIS — Z9181 History of falling: Secondary | ICD-10-CM

## 2021-04-05 DIAGNOSIS — R29898 Other symptoms and signs involving the musculoskeletal system: Secondary | ICD-10-CM

## 2021-04-05 DIAGNOSIS — S3210XS Unspecified fracture of sacrum, sequela: Secondary | ICD-10-CM | POA: Diagnosis not present

## 2021-04-05 DIAGNOSIS — B91 Sequelae of poliomyelitis: Secondary | ICD-10-CM

## 2021-04-05 DIAGNOSIS — M89662 Osteopathy after poliomyelitis, left lower leg: Secondary | ICD-10-CM | POA: Diagnosis not present

## 2021-04-07 ENCOUNTER — Ambulatory Visit: Payer: Medicare HMO | Admitting: Physical Therapy

## 2021-04-07 ENCOUNTER — Other Ambulatory Visit: Payer: Self-pay

## 2021-04-07 DIAGNOSIS — M89662 Osteopathy after poliomyelitis, left lower leg: Secondary | ICD-10-CM | POA: Diagnosis not present

## 2021-04-07 DIAGNOSIS — S322XXS Fracture of coccyx, sequela: Secondary | ICD-10-CM | POA: Diagnosis not present

## 2021-04-07 DIAGNOSIS — R29898 Other symptoms and signs involving the musculoskeletal system: Secondary | ICD-10-CM

## 2021-04-07 DIAGNOSIS — S3210XS Unspecified fracture of sacrum, sequela: Secondary | ICD-10-CM | POA: Diagnosis not present

## 2021-04-07 DIAGNOSIS — Z9181 History of falling: Secondary | ICD-10-CM

## 2021-04-07 DIAGNOSIS — M545 Low back pain, unspecified: Secondary | ICD-10-CM

## 2021-04-07 DIAGNOSIS — B91 Sequelae of poliomyelitis: Secondary | ICD-10-CM | POA: Diagnosis not present

## 2021-04-07 NOTE — Therapy (Signed)
[facility] [address]. [place], [place], [address] Phone: [phone]   Fax:  [phone]  Physical Therapy Treatment  Patient Details  Name: ITHZEL FEDORCHAK MRN: [id number] Date of Birth: 08-17-1945 Referring Provider (PT): Girtha Hake MD   Encounter Date: 04/05/2021   PT End of Session - 04/07/21 1248     Visit Number 21    Number of Visits 22    Date for PT Re-Evaluation 04/12/21    Authorization - Visit Number 1    Authorization - Number of Visits 10    Progress Note Due on Visit 10    PT Start Time 2409    PT Stop Time 1430    PT Time Calculation (min) 52 min    Equipment Utilized During Treatment Gait belt   4WW and FWW   Activity Tolerance Patient tolerated treatment well;Patient limited by fatigue   increased unsteadiness after BPPV treatment.   Behavior During Therapy [facility] for tasks assessed/performed             Past Medical History:  Diagnosis Date   Anxiety    Colon cancer ([place]) 10/12/2014   Stage IIB; had chemo   Diabetes mellitus without complication (HCC)    Hyperlipidemia    Hypertension    Lateral epicondylitis    Personal history of chemotherapy    Polio    childhood   Skin cancer    Sqamous Cell, Lt Calf   Sleep apnea    no CPAP, sx improved with wt loss    Past Surgical History:  Procedure Laterality Date   APPENDECTOMY     BACK SURGERY     CATARACT EXTRACTION W/PHACO Left 12/03/2017   Procedure: CATARACT EXTRACTION PHACO AND INTRAOCULAR LENS PLACEMENT ([place]) LEFT  DIABETIC;  Surgeon: Eulogio Bear, MD;  Location: [place];  Service: Ophthalmology;  Laterality: Left;  Diabetic - insulin   CATARACT EXTRACTION W/PHACO Right 01/14/2018   Procedure: CATARACT EXTRACTION PHACO AND INTRAOCULAR LENS PLACEMENT ([facility]) RIGHT DIABETIC;  Surgeon: Eulogio Bear, MD;  Location: [place];  Service: Ophthalmology;  Laterality: Right;  Diabetic - insulin   CERVICAL DISC  SURGERY     CHOLECYSTECTOMY     COLON SURGERY     COLONOSCOPY     COLONOSCOPY WITH PROPOFOL N/A 05/17/2015   Procedure: COLONOSCOPY WITH PROPOFOL;  Surgeon: Manya Silvas, MD;  [facility] END[facility]COPY;  Service: Endoscopy;  Laterality: N/A;   COLONOSCOPY WITH PROPOFOL N/A 04/01/2018   Procedure: COLONOSCOPY WITH BIOPSY;  Surgeon: [name], MD;  Location: [place];  Service: Endoscopy;  Laterality: N/A;  Diabetic - insulin   EMBOLIZATION N/A 01/24/2021   Procedure: EMBOLIZATION;  Surgeon: Algernon Huxley, MD;  Location: [place] CV LAB;  Service: Cardiovascular;  Laterality: N/A;   ESOPHAGOGASTRODUODENOSCOPY     HERNIA REPAIR     LOWER EXTREMITY VENOGRAPHY Left 01/24/2021   Procedure: LOWER EXTREMITY VENOGRAPHY;  Surgeon: Algernon Huxley, MD;  Location: [place] CV LAB;  Service: Cardiovascular;  Laterality: Left;   POLYPECTOMY N/A 04/01/2018   Procedure: POLYPECTOMY;  Surgeon: [name], MD;  Location: [place];  Service: Endoscopy;  Laterality: N/A;   PORT A CATH INJECTION ([place] HX)     Port has been removed   TUBAL LIGATION      There were no vitals filed for this visit.   Subjective Assessment - 04/07/21 1247     Subjective Pt. reports no falls or  new complaints.  Pt. went shopping with dtr. the other day and did a lot of walking.    Pertinent History per referring note: "Patient notes that she does have a history of chronic back pain which did worsen after her fall. She also endorses a history of polio. Over the past month she feels that she has been more off balance and has had more falls. She is also noticing worsening weakness in her legs. She rates her pain as an 8/10. Is intermittent, stiff. She denies any numbness, tingling or loss of control of bowel or bladder. " H/o Hx cancer, previous back surgery, DM, vertigo, Aortic atherosclerosis ([place]).    Limitations Walking;House hold activities;Sitting;Lifting;Standing    Diagnostic tests MRI of  lumbar and cervical spine. See imaging history.    Patient Stated Goals Reduce pain and improve balance.    Currently in Pain? Yes    Pain Score 2     Pain Location Back    Pain Orientation Lower    Pain Onset More than a month ago               There.ex: no ankle wts today.     Nustep L4 for 10 min. B UE/LE (warm-up/ discussed weekend activities).     Step ups on L/R 10x each (requires L UE assist during R LE step ups).    Discussed HEP     Neuro Re-Ed:    Walking in //-bars with high marching/ braiding/ alt. UE and LE touches.   Airex/ hurdles in //-bars with mirror feedback.  Tandem stance/ gait (forward and backwards)- difficulty with backwards tandem gait.    Lateral walking in //-bars with upright posture 4x (no UE assist).      Walking in hallway.  VC on reciprocal arm swing. Increase assist/ guarding while walking on grassy terrain and transitioning to sidewalk/curb          PT Long Term Goals - 04/02/21 1253       PT LONG TERM GOAL #1   Title Pt will improve MMT scores by 1/2 grade to promote functional strength gains for transfers, amb, and decreased caregiver burden.    Baseline 4+/4 Hip flexion  4/4-  Hip abduction  4+/4  Hip adduction  4+/4  Knee extension  4/4 Knee flexion 4/4 Ankle dorsiflexion  4/4 Ankle plantarflexion    Time 4    Period Weeks    Status Partially Met    Target Date 04/12/21      PT LONG TERM GOAL #2   Title Pt will improve BERG score by 5 pts to promote safety with ambulation and functional tasks.    Baseline BERG: 18/56.  11/29: 40/56    Time 4    Period Weeks    Status Partially Met    Target Date 04/12/21      PT LONG TERM GOAL #3   Title Pt will improve FOTO score to predicted improvement value of 56 to measure self reported improvement in functional mobility.    Baseline Initial eval: 36.   11/7: 54 (decrease in back pain but limitations in safe ambulation).    Time 4    Period Weeks    Status Partially Met     Target Date 04/12/21      PT LONG TERM GOAL #4   Title Pt will demonstrates safety with ambulation using FWW with no LOB and only occ verbal cueing and SBA with 10 minutes of overground walking.  Baseline Pt amb 1 min with extensive verbal cueing CGA-min A.    Time 8    Period Weeks    Status Not Met    Target Date 04/12/21      PT LONG TERM GOAL #5   Title Pt will decrease worse pain to 7/10 or less to promote greater access to functional mobility.    Baseline Present 7/10, Best 4/10, Worst 10/10.  11/29: <3/10 back pain    Time 4    Period Weeks    Status Achieved    Target Date 03/15/21                   Plan - 04/07/21 1249     Clinical Impression Statement Pt. progressing well with dynamic balance/ walking tasks in PT when not fatigued.  Pt. easily fatigued with use of B ankle wts. and has benefited from a greater focus on balance tasks, not LE strengthening.  Pt. challenged with tandem gait, esp. with backwards walking.  Pt. will continue with current HEP and daily walking.  No increase in back/ sacral pain.    Personal Factors and Comorbidities Comorbidity 3+;Age;Transportation;Fitness    Comorbidities Hx cancer, previous back surgery, DM, vertigo, Aortic atherosclerosis ([place])    Examination-Activity Limitations Stairs;Reach Overhead;Stand;Dressing;Sit;Transfers;Lift;Caring for Others;Carry;Locomotion Level;Squat    Examination-Participation Restrictions Interpersonal Relationship;Cleaning;Laundry;Yard Work;Driving;Meal Prep;Shop    Stability/Clinical Decision Making Evolving/Moderate complexity    Clinical Decision Making Moderate    Rehab Potential Fair    PT Frequency 2x / week    PT Duration 4 weeks    PT Treatment/Interventions ADLs/Self Care Home Management;Aquatic Therapy;Neuromuscular re-education;Balance training;Therapeutic exercise;Therapeutic activities;Functional mobility training;Stair training;Gait training;DME Instruction;Patient/family  education;Manual techniques;Energy conservation;Biofeedback;Electrical Stimulation;Moist Heat;Traction;Ultrasound;Dry needling;Passive range of motion;Vestibular;Vasopneumatic Device;Spinal Manipulations;Joint Manipulations    PT Next Visit Plan Dynamic balance/ gait activities.  Discuss POC/ schedule.  Lifting/ carrying tasks next tx.    Consulted and Agree with Plan of Care Patient             Patient will benefit from skilled therapeutic intervention in order to improve the following deficits and impairments:  Abnormal gait, Decreased endurance, Decreased activity tolerance, Decreased strength, Pain, Difficulty walking, Decreased mobility, Decreased balance, Decreased range of motion, Improper body mechanics, Postural dysfunction, Decreased safety awareness, Decreased coordination, Decreased knowledge of precautions, Decreased knowledge of use of DME, Dizziness, Hypomobility, Impaired flexibility, Impaired sensation, Impaired perceived functional ability  Visit Diagnosis: Bilateral low back pain, unspecified chronicity, unspecified whether sciatica present  At high risk for injury related to fall  Bilateral leg weakness  Closed fracture of sacrum and coccyx, sequela  Polio osteopathy of lower leg, left ([place])     Problem List Patient Active Problem List   Diagnosis Date Noted   Aortic atherosclerosis ([place]) 12/29/2020   Deformity of toe of left foot 03/28/2020   H/O multiple pulmonary nodules 09/22/2019   Chronic cough 09/22/2019   Perennial allergic rhinitis 09/17/2019   Acute upper respiratory infection 08/28/2019   Weakness 08/28/2019   Acute bronchitis 06/01/2019   Cough productive of yellow sputum 06/01/2019   Vasomotor rhinitis 05/01/2019   Chronic kidney disease, stage II (mild) 03/14/2019   Gastroesophageal reflux disease without esophagitis 03/14/2019   Intractable episodic headache 03/14/2019   Need for prophylactic vaccination with combined  diphtheria-tetanus-pertussis (DTaP) vaccine 03/14/2019   Renal cyst 11/27/2018   Abnormality of gait and mobility 11/27/2018   Hepatic steatosis 10/31/2018   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[place]) 10/31/2018   Elevated ferritin 10/31/2018  Vertigo 08/13/2018   Gastroenteritis 05/09/2018   Diarrhea 05/09/2018   Nausea 05/09/2018   Personal history of colon cancer    Polyp of sigmoid colon    Dysuria 02/23/2018   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 02/10/2018   Encounter for general adult medical examination with abnormal findings 02/10/2018   Acute non-recurrent pansinusitis 08/24/2017   Other headache syndrome 08/24/2017   Uncontrolled type 2 diabetes mellitus with hyperglycemia ([place]) 08/24/2017   Type 2 diabetes mellitus without complication, with long-term current use of insulin ([place]) 08/22/2017   Need for vaccination against Streptococcus pneumoniae using pneumococcal conjugate vaccine 13 08/22/2017   Essential hypertension 05/03/2017   Otalgia, bilateral 05/02/2017   Type 2 diabetes mellitus with hyperglycemia ([place]) 05/02/2017   Mixed hyperlipidemia 05/02/2017   Right lower quadrant pain 06/29/2016   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   History of colon cancer 04/13/2015   Cancer of ascending colon ([place]) 10/12/2014   Pura Spice, PT, DPT # [phone] 04/07/2021, 1:05 PM  [place] [facility] [address] [place], [place], [address] Phone: [phone]   Fax:  [phone]  Name: GENEVIE ELMAN MRN: [id number] Date of Birth: 1946-04-01

## 2021-04-13 ENCOUNTER — Encounter: Payer: Self-pay | Admitting: Physical Therapy

## 2021-04-13 ENCOUNTER — Ambulatory Visit: Payer: Medicare HMO | Admitting: Physical Therapy

## 2021-04-13 ENCOUNTER — Other Ambulatory Visit: Payer: Self-pay

## 2021-04-13 DIAGNOSIS — M545 Low back pain, unspecified: Secondary | ICD-10-CM

## 2021-04-13 DIAGNOSIS — R29898 Other symptoms and signs involving the musculoskeletal system: Secondary | ICD-10-CM

## 2021-04-13 DIAGNOSIS — S3210XS Unspecified fracture of sacrum, sequela: Secondary | ICD-10-CM | POA: Diagnosis not present

## 2021-04-13 DIAGNOSIS — B91 Sequelae of poliomyelitis: Secondary | ICD-10-CM

## 2021-04-13 DIAGNOSIS — Z9181 History of falling: Secondary | ICD-10-CM

## 2021-04-13 DIAGNOSIS — S322XXS Fracture of coccyx, sequela: Secondary | ICD-10-CM | POA: Diagnosis not present

## 2021-04-13 DIAGNOSIS — M89662 Osteopathy after poliomyelitis, left lower leg: Secondary | ICD-10-CM | POA: Diagnosis not present

## 2021-04-13 NOTE — Therapy (Signed)
[facility] [address] [place], [place], [address] Phone: [phone]   Fax:  [phone]  Physical Therapy Treatment  Patient Details  Name: Tiffany Hawkins MRN: [id number] Date of Birth: February 11, 1946 Referring Provider (PT): Girtha Hake MD   Encounter Date: 04/07/2021   PT End of Session - 04/13/21 0848     Visit Number 22    Number of Visits 22    Date for PT Re-Evaluation 04/12/21    Authorization - Visit Number 2    Authorization - Number of Visits 10    Progress Note Due on Visit 10    PT Start Time 9381    PT Stop Time 1432    PT Time Calculation (min) 55 min    Equipment Utilized During Treatment Gait belt   4WW and FWW   Activity Tolerance Patient tolerated treatment well;Patient limited by fatigue   increased unsteadiness after BPPV treatment.   Behavior During Therapy [facility] for tasks assessed/performed             Past Medical History:  Diagnosis Date   Anxiety    Colon cancer ([place]) 10/12/2014   Stage IIB; had chemo   Diabetes mellitus without complication (HCC)    Hyperlipidemia    Hypertension    Lateral epicondylitis    Personal history of chemotherapy    Polio    childhood   Skin cancer    Sqamous Cell, Lt Calf   Sleep apnea    no CPAP, sx improved with wt loss    Past Surgical History:  Procedure Laterality Date   APPENDECTOMY     BACK SURGERY     CATARACT EXTRACTION W/PHACO Left 12/03/2017   Procedure: CATARACT EXTRACTION PHACO AND INTRAOCULAR LENS PLACEMENT ([place]) LEFT  DIABETIC;  Surgeon: Eulogio Bear, MD;  Location: [place];  Service: Ophthalmology;  Laterality: Left;  Diabetic - insulin   CATARACT EXTRACTION W/PHACO Right 01/14/2018   Procedure: CATARACT EXTRACTION PHACO AND INTRAOCULAR LENS PLACEMENT ([facility]) RIGHT DIABETIC;  Surgeon: Eulogio Bear, MD;  Location: [place];  Service: Ophthalmology;  Laterality: Right;  Diabetic - insulin   CERVICAL DISC  SURGERY     CHOLECYSTECTOMY     COLON SURGERY     COLONOSCOPY     COLONOSCOPY WITH PROPOFOL N/A 05/17/2015   Procedure: COLONOSCOPY WITH PROPOFOL;  Surgeon: Manya Silvas, MD;  [facility] ENDOSCOPY;  Service: Endoscopy;  Laterality: N/A;   COLONOSCOPY WITH PROPOFOL N/A 04/01/2018   Procedure: COLONOSCOPY WITH BIOPSY;  Surgeon: [name], MD;  Location: [place];  Service: Endoscopy;  Laterality: N/A;  Diabetic - insulin   EMBOLIZATION N/A 01/24/2021   Procedure: EMBOLIZATION;  Surgeon: Algernon Huxley, MD;  Location: [place] CV LAB;  Service: Cardiovascular;  Laterality: N/A;   ESOPHAGOGASTRODUODENOSCOPY     HERNIA REPAIR     LOWER EXTREMITY VENOGRAPHY Left 01/24/2021   Procedure: LOWER EXTREMITY VENOGRAPHY;  Surgeon: Algernon Huxley, MD;  Location: [place] CV [facility];  Service: Cardiovascular;  Laterality: Left;   POLYPECTOMY N/A 04/01/2018   Procedure: POLYPECTOMY;  Surgeon: [name], MD;  Location: [place];  Service: Endoscopy;  Laterality: N/A;   PORT A CATH INJECTION ([place] HX)     Port has been removed   TUBAL LIGATION      There were no vitals filed for this visit.   Subjective Assessment - 04/13/21 0846     Subjective Pt. states that today is  her last scheduled PT tx. session.  Pt. reports marked benefit in walking/ balance since starting PT and is hoping to get more PT visits.  No recent falls reported.  Pt. is hoping to return to driving but pts. husband does not feel pt. is ready.    Pertinent History per referring note: "Patient notes that she does have a history of chronic back pain which did worsen after her fall. She also endorses a history of polio. Over the past month she feels that she has been more off balance and has had more falls. She is also noticing worsening weakness in her legs. She rates her pain as an 8/10. Is intermittent, stiff. She denies any numbness, tingling or loss of control of bowel or bladder. " H/o Hx cancer,  previous back surgery, DM, vertigo, Aortic atherosclerosis ([place]).    Limitations Walking;House hold activities;Sitting;Lifting;Standing    Diagnostic tests MRI of lumbar and cervical spine. See imaging history.    Patient Stated Goals Reduce pain and improve balance.    Currently in Pain? Yes    Pain Score 2     Pain Location Back    Pain Orientation Lower    Pain Onset More than a month ago              There.ex: no ankle wts today.     Nustep L4 for 10 min. B UE/LE (warm-up/ discussed walking around house and yard)     Step ups on L/R 10x each (requires L UE assist during R LE step ups)- lateral and forward.     Seated marching/ hip abduction/ LAQ/ heel raises 20x each.       Neuro Re-Ed:    Walking in //-bars with high marching/ braiding/ alt. UE and LE touches.      Tandem stance/ gait (forward and backwards)- difficulty with backwards tandem gait.    7# floor to waist box lifting/ carrying in clinic (CGA and cuing to correct posture/ head position)    Walking in hallway.  VC on reciprocal arm swing. Increase assist/ guarding while walking on grassy terrain and transitioning to sidewalk/curb.  Added head turns with identification of letters/ numbers.           PT Long Term Goals - 04/02/21 1253       PT LONG TERM GOAL #1   Title Pt will improve MMT scores by 1/2 grade to promote functional strength gains for transfers, amb, and decreased caregiver burden.    Baseline 4+/4 Hip flexion  4/4-  Hip abduction  4+/4  Hip adduction  4+/4  Knee extension  4/4 Knee flexion 4/4 Ankle dorsiflexion  4/4 Ankle plantarflexion    Time 4    Period Weeks    Status Partially Met    Target Date 04/12/21      PT LONG TERM GOAL #2   Title Pt will improve BERG score by 5 pts to promote safety with ambulation and functional tasks.    Baseline BERG: 18/56.  11/29: 40/56    Time 4    Period Weeks    Status Partially Met    Target Date 04/12/21      PT LONG TERM GOAL #3   Title  Pt will improve FOTO score to predicted improvement value of 56 to measure self reported improvement in functional mobility.    Baseline Initial eval: 36.   11/7: 54 (decrease in back pain but limitations in safe ambulation).    Time 4  Period Weeks    Status Partially Met    Target Date 04/12/21      PT LONG TERM GOAL #4   Title Pt will demonstrates safety with ambulation using FWW with no LOB and only occ verbal cueing and SBA with 10 minutes of overground walking.    Baseline Pt amb 1 min with extensive verbal cueing CGA-min A.    Time 8    Period Weeks    Status Not Met    Target Date 04/12/21      PT LONG TERM GOAL #5   Title Pt will decrease worse pain to 7/10 or less to promote greater access to functional mobility.    Baseline Present 7/10, Best 4/10, Worst 10/10.  11/29: <3/10 back pain    Time 4    Period Weeks    Status Achieved    Target Date 03/15/21                   Plan - 04/13/21 0849     Clinical Impression Statement Pt. challenged with floor to waist box (7#) lift and B carrying in clinic.  Pt. requires CGA for safety and cuing to maintain box close to body/ upright posture.  No LOB but extra time required due to shorted step length/ pattern.  2 episodes of scissoring gait pattern while completing head turns in hallway.  Pt. able to self-correct LOB and stop to regroup to improve safety/ prevent falls.    Personal Factors and Comorbidities Comorbidity 3+;Age;Transportation;Fitness    Comorbidities Hx cancer, previous back surgery, DM, vertigo, Aortic atherosclerosis ([place])    Examination-Activity Limitations Stairs;Reach Overhead;Stand;Dressing;Sit;Transfers;Lift;Caring for Others;Carry;Locomotion Level;Squat    Examination-Participation Restrictions Interpersonal Relationship;Cleaning;Laundry;Yard Work;Driving;Meal Prep;Shop    Stability/Clinical Decision Making Evolving/Moderate complexity    Clinical Decision Making Moderate    Rehab Potential Fair     PT Frequency 2x / week    PT Duration 4 weeks    PT Treatment/Interventions ADLs/Self Care Home Management;Aquatic Therapy;Neuromuscular re-education;Balance training;Therapeutic exercise;Therapeutic activities;Functional mobility training;Stair training;Gait training;DME Instruction;Patient/family education;Manual techniques;Energy conservation;Biofeedback;Electrical Stimulation;Moist Heat;Traction;Ultrasound;Dry needling;Passive range of motion;Vestibular;Vasopneumatic Device;Spinal Manipulations;Joint Manipulations    PT Next Visit Plan Dynamic balance/ gait activities.  Discuss POC/ schedule.    Consulted and Agree with Plan of Care Patient             Patient will benefit from skilled therapeutic intervention in order to improve the following deficits and impairments:  Abnormal gait, Decreased endurance, Decreased activity tolerance, Decreased strength, Pain, Difficulty walking, Decreased mobility, Decreased balance, Decreased range of motion, Improper body mechanics, Postural dysfunction, Decreased safety awareness, Decreased coordination, Decreased knowledge of precautions, Decreased knowledge of use of DME, Dizziness, Hypomobility, Impaired flexibility, Impaired sensation, Impaired perceived functional ability  Visit Diagnosis: Bilateral low back pain, unspecified chronicity, unspecified whether sciatica present  At high risk for injury related to fall  Bilateral leg weakness  Closed fracture of sacrum and coccyx, sequela  Polio osteopathy of lower leg, left [facility])     Problem List Patient Active Problem List   Diagnosis Date Noted   Aortic atherosclerosis ([place]) 12/29/2020   Deformity of toe of left foot 03/28/2020   H/O multiple pulmonary nodules 09/22/2019   Chronic cough 09/22/2019   Perennial allergic rhinitis 09/17/2019   Acute upper respiratory infection 08/28/2019   Weakness 08/28/2019   Acute bronchitis 06/01/2019   Cough productive of yellow sputum 06/01/2019    Vasomotor rhinitis 05/01/2019   Chronic kidney disease, stage II (mild) 03/14/2019   Gastroesophageal reflux  disease without esophagitis 03/14/2019   Intractable episodic headache 03/14/2019   Need for prophylactic vaccination with combined diphtheria-tetanus-pertussis (DTaP) vaccine 03/14/2019   Renal cyst 11/27/2018   Abnormality of gait and mobility 11/27/2018   Hepatic steatosis 10/31/2018   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[place]) 10/31/2018   Elevated ferritin 10/31/2018   Vertigo 08/13/2018   Gastroenteritis 05/09/2018   Diarrhea 05/09/2018   Nausea 05/09/2018   Personal history of colon cancer    Polyp of sigmoid colon    Dysuria 02/23/2018   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 02/10/2018   Encounter for general adult medical examination with abnormal findings 02/10/2018   Acute non-recurrent pansinusitis 08/24/2017   Other headache syndrome 08/24/2017   Uncontrolled type 2 diabetes mellitus with hyperglycemia ([place]) 08/24/2017   Type 2 diabetes mellitus without complication, with long-term current use of insulin ([place]) 08/22/2017   Need for vaccination against Streptococcus pneumoniae using pneumococcal conjugate vaccine 13 08/22/2017   Essential hypertension 05/03/2017   Otalgia, bilateral 05/02/2017   Type 2 diabetes mellitus with hyperglycemia ([place]) 05/02/2017   Mixed hyperlipidemia 05/02/2017   Right lower quadrant pain 06/29/2016   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   History of colon cancer 04/13/2015   Cancer of ascending colon ([place]) 10/12/2014   Pura Spice, PT, DPT # [phone] 04/13/2021, 8:55 AM  [place] [facility] [address]. [place], [place], [address] Phone: [phone]   Fax:  [phone]  Name: Tiffany Hawkins MRN: [id number] Date of Birth: Nov 19, 1945

## 2021-04-13 NOTE — Therapy (Signed)
[facility] [address] [place], [place], [address] Phone: [phone]   Fax:  [phone]  Physical Therapy Treatment  Patient Details  Name: Tiffany Hawkins MRN: [id number] Date of Birth: December 10, 1945 Referring Provider (PT): Girtha Hake MD   Encounter Date: 04/13/2021   Treatment: 23 of 31.  Recert date: 0/25/4270 1032 to 1117   Past Medical History:  Diagnosis Date   Anxiety    Colon cancer ([place]) 10/12/2014   Stage IIB; had chemo   Diabetes mellitus without complication (HCC)    Hyperlipidemia    Hypertension    Lateral epicondylitis    Personal history of chemotherapy    Polio    childhood   Skin cancer    Sqamous Cell, Lt Calf   Sleep apnea    no CPAP, sx improved with wt loss    Past Surgical History:  Procedure Laterality Date   APPENDECTOMY     BACK SURGERY     CATARACT EXTRACTION W/PHACO Left 12/03/2017   Procedure: CATARACT EXTRACTION PHACO AND INTRAOCULAR LENS PLACEMENT ([place]) LEFT  DIABETIC;  Surgeon: Eulogio Bear, MD;  Location: [place];  Service: Ophthalmology;  Laterality: Left;  Diabetic - insulin   CATARACT EXTRACTION W/PHACO Right 01/14/2018   Procedure: CATARACT EXTRACTION PHACO AND INTRAOCULAR LENS PLACEMENT ([facility]) RIGHT DIABETIC;  Surgeon: Eulogio Bear, MD;  Location: [place];  Service: Ophthalmology;  Laterality: Right;  Diabetic - insulin   CERVICAL DISC SURGERY     CHOLECYSTECTOMY     COLON SURGERY     COLONOSCOPY     COLONOSCOPY WITH PROPOFOL N/A 05/17/2015   Procedure: COLONOSCOPY WITH PROPOFOL;  Surgeon: Manya Silvas, MD;  Location: [facility] ENDOSCOPY;  Service: Endoscopy;  Laterality: N/A;   COLONOSCOPY WITH PROPOFOL N/A 04/01/2018   Procedure: COLONOSCOPY WITH BIOPSY;  Surgeon: [name], MD;  Location: [place];  Service: Endoscopy;  Laterality: N/A;  Diabetic - insulin   EMBOLIZATION N/A 01/24/2021   Procedure: EMBOLIZATION;  Surgeon:  Algernon Huxley, MD;  Location: [place] CV LAB;  Service: Cardiovascular;  Laterality: N/A;   ESOPHAGOGASTRODUODENOSCOPY     HERNIA REPAIR     LOWER EXTREMITY VENOGRAPHY Left 01/24/2021   Procedure: LOWER EXTREMITY VENOGRAPHY;  Surgeon: Algernon Huxley, MD;  Location: [place] CV LAB;  Service: Cardiovascular;  Laterality: Left;   POLYPECTOMY N/A 04/01/2018   Procedure: POLYPECTOMY;  Surgeon: [name], MD;  Location: [place];  Service: Endoscopy;  Laterality: N/A;   PORT A CATH INJECTION ([place] HX)     Port has been removed   TUBAL LIGATION      There were no vitals filed for this visit.   Pt. states she has not slept well the past 2 nights secondary to CPAP issues. Pt. reports the CPAP has been fixed. No falls reported over the busy Christmas weekend.       There.ex: no ankle wts today.     Nustep L4 for 10 min. B UE/LE (no charge)   Step ups on L/R 10x each (requires L UE assist during R LE step ups)- lateral and forward.     Seated ex. With RTB:  marching/ hip abduction/ LAQ/ heel raises 20x each.       Neuro Re-Ed:    Walking in //-bars with high marching/ braiding/ alt. UE and LE touches.    Alt. Step touches on L/R with focus on L ankle/LE stability without UE assist on handrails.  Progressing to 2nd step touches with no UE assist required.    Tandem stance outside of //-bars with mirror feedback.    Walking in hallway.  Working on 2-point gait with use of SPC with min. To mod. Cuing.       PT Long Term Goals - 04/16/21 1012       PT LONG TERM GOAL #1   Title Pt will improve MMT scores by 1/2 grade to promote functional strength gains for transfers, amb, and decreased caregiver burden.    Baseline 4+/4 Hip flexion  4/4-  Hip abduction  4+/4  Hip adduction  4+/4  Knee extension  4/4 Knee flexion 4/4 Ankle dorsiflexion  4/4 Ankle plantarflexion    Time 4    Period Weeks    Status Partially Met    Target Date 05/11/21      PT LONG TERM GOAL  #2   Title Pt will improve BERG score by 5 pts to promote safety with ambulation and functional tasks.    Baseline BERG: 18/56.  11/29: 40/56    Time 4    Period Weeks    Status Partially Met    Target Date 05/11/21      PT LONG TERM GOAL #3   Title Pt will improve FOTO score to predicted improvement value of 56 to measure self reported improvement in functional mobility.    Baseline Initial eval: 36.   11/7: 54 (decrease in back pain but limitations in safe ambulation).  12/28: 68 (goal met)    Time 4    Period Weeks    Status Achieved    Target Date 04/13/21      PT LONG TERM GOAL #4   Title Pt will demonstrates safety with ambulation using FWW with no LOB and only occ verbal cueing and SBA with 10 minutes of overground walking.    Baseline Pt amb 1 min with extensive verbal cueing CGA-min A.    Time 8    Period Weeks    Status Not Met    Target Date 05/11/21      PT LONG TERM GOAL #5   Title Pt will decrease worse pain to 7/10 or less to promote greater access to functional mobility.    Baseline Present 7/10, Best 4/10, Worst 10/10.  11/29: <3/10 back pain    Time 4    Period Weeks    Status Achieved    Target Date 03/15/21                   Plan - 04/16/21 1008     Clinical Impression Statement Tx. focus on balance tasks/ coordination while walking and completing functional tasks. Pt. requires extra time to complete standing dynamic tasks and benefits from light UE assist for safety. Pt. prefers standing lumbar flexion with floor to waist lifting and educated in proper posture/ technique/ knee flexion. Pt. benefits from light use of UE in //-bars with all dynamic tasks for safety. No recent falls but high fall risk remains.  See updated goals.    Personal Factors and Comorbidities Comorbidity 3+;Age;Transportation;Fitness    Comorbidities Hx cancer, previous back surgery, DM, vertigo, Aortic atherosclerosis ([place])    Examination-Activity Limitations Stairs;Reach  Overhead;Stand;Dressing;Sit;Transfers;Lift;Caring for Others;Carry;Locomotion Level;Squat    Examination-Participation Restrictions Interpersonal Relationship;Cleaning;Laundry;Yard Work;Driving;Meal Prep;Shop    Stability/Clinical Decision Making Evolving/Moderate complexity    Clinical Decision Making Moderate    Rehab Potential Fair    PT Frequency 2x / week    PT Duration  4 weeks    PT Treatment/Interventions ADLs/Self Care Home Management;Aquatic Therapy;Neuromuscular re-education;Balance training;Therapeutic exercise;Therapeutic activities;Functional mobility training;Stair training;Gait training;DME Instruction;Patient/family education;Manual techniques;Energy conservation;Biofeedback;Electrical Stimulation;Moist Heat;Traction;Ultrasound;Dry needling;Passive range of motion;Vestibular;Vasopneumatic Device;Spinal Manipulations;Joint Manipulations    PT Next Visit Plan Dynamic balance/ gait activities.  Discuss POC/ schedule.    Consulted and Agree with Plan of Care Patient             Patient will benefit from skilled therapeutic intervention in order to improve the following deficits and impairments:  Abnormal gait, Decreased endurance, Decreased activity tolerance, Decreased strength, Pain, Difficulty walking, Decreased mobility, Decreased balance, Decreased range of motion, Improper body mechanics, Postural dysfunction, Decreased safety awareness, Decreased coordination, Decreased knowledge of precautions, Decreased knowledge of use of DME, Dizziness, Hypomobility, Impaired flexibility, Impaired sensation, Impaired perceived functional ability  Visit Diagnosis: Bilateral low back pain, unspecified chronicity, unspecified whether sciatica present  At high risk for injury related to fall  Bilateral leg weakness  Closed fracture of sacrum and coccyx, sequela  Polio osteopathy of lower leg, left [facility])     Problem List Patient Active Problem List   Diagnosis Date Noted   Aortic  atherosclerosis ([place]) 12/29/2020   Deformity of toe of left foot 03/28/2020   H/O multiple pulmonary nodules 09/22/2019   Chronic cough 09/22/2019   Perennial allergic rhinitis 09/17/2019   Acute upper respiratory infection 08/28/2019   Weakness 08/28/2019   Acute bronchitis 06/01/2019   Cough productive of yellow sputum 06/01/2019   Vasomotor rhinitis 05/01/2019   Chronic kidney disease, stage II (mild) 03/14/2019   Gastroesophageal reflux disease without esophagitis 03/14/2019   Intractable episodic headache 03/14/2019   Need for prophylactic vaccination with combined diphtheria-tetanus-pertussis (DTaP) vaccine 03/14/2019   Renal cyst 11/27/2018   Abnormality of gait and mobility 11/27/2018   Hepatic steatosis 10/31/2018   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[place]) 10/31/2018   Elevated ferritin 10/31/2018   Vertigo 08/13/2018   Gastroenteritis 05/09/2018   Diarrhea 05/09/2018   Nausea 05/09/2018   Personal history of colon cancer    Polyp of sigmoid colon    Dysuria 02/23/2018   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 02/10/2018   Encounter for general adult medical examination with abnormal findings 02/10/2018   Acute non-recurrent pansinusitis 08/24/2017   Other headache syndrome 08/24/2017   Uncontrolled type 2 diabetes mellitus with hyperglycemia ([place]) 08/24/2017   Type 2 diabetes mellitus without complication, with long-term current use of insulin ([place]) 08/22/2017   Need for vaccination against Streptococcus pneumoniae using pneumococcal conjugate vaccine 13 08/22/2017   Essential hypertension 05/03/2017   Otalgia, bilateral 05/02/2017   Type 2 diabetes mellitus with hyperglycemia ([place]) 05/02/2017   Mixed hyperlipidemia 05/02/2017   Right lower quadrant pain 06/29/2016   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   History of colon cancer 04/13/2015   Cancer of ascending colon ([place]) 10/12/2014   Pura Spice, PT, DPT #  [phone] 04/16/2021, 10:17 AM  [facility] [address] [place], [place], [address] Phone: [phone]   Fax:  [phone]  Name: LARISHA VENCILL MRN: [id number] Date of Birth: 1945/09/12

## 2021-04-15 ENCOUNTER — Telehealth: Payer: Self-pay

## 2021-04-15 NOTE — Telephone Encounter (Signed)
Faxed pt asst application to [employer] for [employer] 70/30 04/15/21

## 2021-04-19 ENCOUNTER — Other Ambulatory Visit: Payer: Self-pay

## 2021-04-19 ENCOUNTER — Encounter: Payer: Self-pay | Admitting: Physical Therapy

## 2021-04-19 ENCOUNTER — Ambulatory Visit: Payer: Medicare HMO | Attending: Physical Medicine & Rehabilitation | Admitting: Physical Therapy

## 2021-04-19 DIAGNOSIS — R29898 Other symptoms and signs involving the musculoskeletal system: Secondary | ICD-10-CM | POA: Diagnosis not present

## 2021-04-19 DIAGNOSIS — M89662 Osteopathy after poliomyelitis, left lower leg: Secondary | ICD-10-CM | POA: Diagnosis not present

## 2021-04-19 DIAGNOSIS — B91 Sequelae of poliomyelitis: Secondary | ICD-10-CM | POA: Insufficient documentation

## 2021-04-19 DIAGNOSIS — M545 Low back pain, unspecified: Secondary | ICD-10-CM | POA: Insufficient documentation

## 2021-04-19 DIAGNOSIS — S322XXS Fracture of coccyx, sequela: Secondary | ICD-10-CM | POA: Insufficient documentation

## 2021-04-19 DIAGNOSIS — Z9181 History of falling: Secondary | ICD-10-CM | POA: Diagnosis not present

## 2021-04-19 DIAGNOSIS — S3210XS Unspecified fracture of sacrum, sequela: Secondary | ICD-10-CM | POA: Diagnosis not present

## 2021-04-19 NOTE — Therapy (Signed)
[facility] [address] [place], [place], [address] Phone: [phone]   Fax:  [phone]  Physical Therapy Treatment  Patient Details  Name: Tiffany Hawkins MRN: [id number] Date of Birth: 01-Jan-1946 Referring Provider (PT): Girtha Hake MD   Encounter Date: 04/19/2021   PT End of Session - 04/19/21 1415     Visit Number 24    Number of Visits 31    Date for PT Re-Evaluation 05/11/21    Authorization - Visit Number 4    Authorization - Number of Visits 10    Progress Note Due on Visit 10    PT Start Time 6948    PT Stop Time 1432    PT Time Calculation (min) 48 min    Equipment Utilized During Treatment Gait belt   4WW and FWW   Activity Tolerance Patient tolerated treatment well;Patient limited by fatigue   increased unsteadiness after BPPV treatment.   Behavior During Therapy [facility] for tasks assessed/performed             Past Medical History:  Diagnosis Date   Anxiety    Colon cancer ([place]) 10/12/2014   Stage IIB; had chemo   Diabetes mellitus without complication (HCC)    Hyperlipidemia    Hypertension    Lateral epicondylitis    Personal history of chemotherapy    Polio    childhood   Skin cancer    Sqamous Cell, Lt Calf   Sleep apnea    no CPAP, sx improved with wt loss    Past Surgical History:  Procedure Laterality Date   APPENDECTOMY     BACK SURGERY     CATARACT EXTRACTION W/PHACO Left 12/03/2017   Procedure: CATARACT EXTRACTION PHACO AND INTRAOCULAR LENS PLACEMENT ([place]) LEFT  DIABETIC;  Surgeon: Eulogio Bear, MD;  Location: [place];  Service: Ophthalmology;  Laterality: Left;  Diabetic - insulin   CATARACT EXTRACTION W/PHACO Right 01/14/2018   Procedure: CATARACT EXTRACTION PHACO AND INTRAOCULAR LENS PLACEMENT ([facility]) RIGHT DIABETIC;  Surgeon: Eulogio Bear, MD;  Location: [place];  Service: Ophthalmology;  Laterality: Right;  Diabetic - insulin   CERVICAL DISC  SURGERY     CHOLECYSTECTOMY     COLON SURGERY     COLONOSCOPY     COLONOSCOPY WITH PROPOFOL N/A 05/17/2015   Procedure: COLONOSCOPY WITH PROPOFOL;  Surgeon: Manya Silvas, MD;  Location: [facility] ENDOSCOPY;  Service: Endoscopy;  Laterality: N/A;   COLONOSCOPY WITH PROPOFOL N/A 04/01/2018   Procedure: COLONOSCOPY WITH BIOPSY;  Surgeon: [name], MD;  Location: [place];  Service: Endoscopy;  Laterality: N/A;  Diabetic - insulin   EMBOLIZATION N/A 01/24/2021   Procedure: EMBOLIZATION;  Surgeon: Algernon Huxley, MD;  Location: [place] CV LAB;  Service: Cardiovascular;  Laterality: N/A;   ESOPHAGOGASTRODUODENOSCOPY     HERNIA REPAIR     LOWER EXTREMITY VENOGRAPHY Left 01/24/2021   Procedure: LOWER EXTREMITY VENOGRAPHY;  Surgeon: Algernon Huxley, MD;  Location: [place] CV LAB;  Service: Cardiovascular;  Laterality: Left;   POLYPECTOMY N/A 04/01/2018   Procedure: POLYPECTOMY;  Surgeon: [name], MD;  Location: [place];  Service: Endoscopy;  Laterality: N/A;   PORT A CATH INJECTION ([place] HX)     Port has been removed   TUBAL LIGATION      There were no vitals filed for this visit.   Subjective Assessment - 04/19/21 1349     Subjective Pt. reports a fall last  Friday while assisting great granddaughter on bike in yard.  Pt. fell forward on knees and scrapped bridge of nose.  Pt. states she has been using SPC since fall over the weekend.  Pt. entered PT with no assistive device today.  Pt. reports 6/10 low back (generalized) pain since fall.    Pertinent History per referring note: "Patient notes that she does have a history of chronic back pain which did worsen after her fall. She also endorses a history of polio. Over the past month she feels that she has been more off balance and has had more falls. She is also noticing worsening weakness in her legs. She rates her pain as an 8/10. Is intermittent, stiff. She denies any numbness, tingling or loss of control of  bowel or bladder. " H/o Hx cancer, previous back surgery, DM, vertigo, Aortic atherosclerosis ([place]).    Limitations Walking;House hold activities;Sitting;Lifting;Standing    Diagnostic tests MRI of lumbar and cervical spine. See imaging history.    Patient Stated Goals Reduce pain and improve balance.    Currently in Pain? Yes    Pain Score 6     Pain Location Back    Pain Orientation Lower;Right;Left    Pain Descriptors / Indicators Sore    Pain Onset More than a month ago             There.ex:      Nustep L4 for 10 min. B UE/LE (no charge)  Seated/ standing there.ex. (no ankle wts)- hip flexion/ abduction/ LAQ/ hip extension/ heel raises 20x.        Neuro Re-Ed:    Walking in //-bars with Airex pads/ 6" step ups and overs with B UE assist.  Focus on R quad/ hip control with CGA for safety/ cuing.    Walking in //-bars with high marching/ braiding/ alt. UE and LE touches.       Walking in hallway (4 laps).  Working on 2-point gait with use of SPC with min. To mod. Cuing.     Walking at agility ladder with use of SPC working on a consistent step length/ pattern.      PT Long Term Goals - 04/16/21 1012       PT LONG TERM GOAL #1   Title Pt will improve MMT scores by 1/2 grade to promote functional strength gains for transfers, amb, and decreased caregiver burden.    Baseline 4+/4 Hip flexion  4/4-  Hip abduction  4+/4  Hip adduction  4+/4  Knee extension  4/4 Knee flexion 4/4 Ankle dorsiflexion  4/4 Ankle plantarflexion    Time 4    Period Weeks    Status Partially Met    Target Date 05/11/21      PT LONG TERM GOAL #2   Title Pt will improve BERG score by 5 pts to promote safety with ambulation and functional tasks.    Baseline BERG: 18/56.  11/29: 40/56    Time 4    Period Weeks    Status Partially Met    Target Date 05/11/21      PT LONG TERM GOAL #3   Title Pt will improve FOTO score to predicted improvement value of 56 to measure self reported improvement  in functional mobility.    Baseline Initial eval: 36.   11/7: 54 (decrease in back pain but limitations in safe ambulation).  12/28: 68 (goal met)    Time 4    Period Weeks    Status Achieved  Target Date 04/13/21      PT LONG TERM GOAL #4   Title Pt will demonstrates safety with ambulation using FWW with no LOB and only occ verbal cueing and SBA with 10 minutes of overground walking.    Baseline Pt amb 1 min with extensive verbal cueing CGA-min A.    Time 8    Period Weeks    Status Not Met    Target Date 05/11/21      PT LONG TERM GOAL #5   Title Pt will decrease worse pain to 7/10 or less to promote greater access to functional mobility.    Baseline Present 7/10, Best 4/10, Worst 10/10.  11/29: <3/10 back pain    Time 4    Period Weeks    Status Achieved    Target Date 03/15/21                   Plan - 04/19/21 1415     Clinical Impression Statement PT discussed pts. recent fall and ability of pt. to return to stand.  Pt. understands that she walks better/ safer with use of SPC, esp. while outside.  Pt. has difficulty self-correction LOB forward without use of assistive device outside of //-bars.  Pt. fatigued with increase standing/ walking tasks.  Increase c/o low back discomfort since recent fall but no increase c/o LBP during dynamic balance/ tx. activities.  Pt. will bring her personal SPC next tx. session.    Personal Factors and Comorbidities Comorbidity 3+;Age;Transportation;Fitness    Comorbidities Hx cancer, previous back surgery, DM, vertigo, Aortic atherosclerosis ([place])    Examination-Activity Limitations Stairs;Reach Overhead;Stand;Dressing;Sit;Transfers;Lift;Caring for Others;Carry;Locomotion Level;Squat    Examination-Participation Restrictions Interpersonal Relationship;Cleaning;Laundry;Yard Work;Driving;Meal Prep;Shop    Stability/Clinical Decision Making Evolving/Moderate complexity    Clinical Decision Making Moderate    Rehab Potential Fair    PT  Frequency 2x / week    PT Duration 4 weeks    PT Treatment/Interventions ADLs/Self Care Home Management;Aquatic Therapy;Neuromuscular re-education;Balance training;Therapeutic exercise;Therapeutic activities;Functional mobility training;Stair training;Gait training;DME Instruction;Patient/family education;Manual techniques;Energy conservation;Biofeedback;Electrical Stimulation;Moist Heat;Traction;Ultrasound;Dry needling;Passive range of motion;Vestibular;Vasopneumatic Device;Spinal Manipulations;Joint Manipulations    PT Next Visit Plan Dynamic balance/ gait activities.  Reassess low back pain.    Consulted and Agree with Plan of Care Patient             Patient will benefit from skilled therapeutic intervention in order to improve the following deficits and impairments:  Abnormal gait, Decreased endurance, Decreased activity tolerance, Decreased strength, Pain, Difficulty walking, Decreased mobility, Decreased balance, Decreased range of motion, Improper body mechanics, Postural dysfunction, Decreased safety awareness, Decreased coordination, Decreased knowledge of precautions, Decreased knowledge of use of DME, Dizziness, Hypomobility, Impaired flexibility, Impaired sensation, Impaired perceived functional ability  Visit Diagnosis: Bilateral low back pain, unspecified chronicity, unspecified whether sciatica present  At high risk for injury related to fall  Bilateral leg weakness  Closed fracture of sacrum and coccyx, sequela  Polio osteopathy of lower leg, left [facility])     Problem List Patient Active Problem List   Diagnosis Date Noted   Aortic atherosclerosis ([place]) 12/29/2020   Deformity of toe of left foot 03/28/2020   H/O multiple pulmonary nodules 09/22/2019   Chronic cough 09/22/2019   Perennial allergic rhinitis 09/17/2019   Acute upper respiratory infection 08/28/2019   Weakness 08/28/2019   Acute bronchitis 06/01/2019   Cough productive of yellow sputum 06/01/2019    Vasomotor rhinitis 05/01/2019   Chronic kidney disease, stage II (mild) 03/14/2019   Gastroesophageal reflux disease  without esophagitis 03/14/2019   Intractable episodic headache 03/14/2019   Need for prophylactic vaccination with combined diphtheria-tetanus-pertussis (DTaP) vaccine 03/14/2019   Renal cyst 11/27/2018   Abnormality of gait and mobility 11/27/2018   Hepatic steatosis 10/31/2018   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[place]) 10/31/2018   Elevated ferritin 10/31/2018   Vertigo 08/13/2018   Gastroenteritis 05/09/2018   Diarrhea 05/09/2018   Nausea 05/09/2018   Personal history of colon cancer    Polyp of sigmoid colon    Dysuria 02/23/2018   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 02/10/2018   Encounter for general adult medical examination with abnormal findings 02/10/2018   Acute non-recurrent pansinusitis 08/24/2017   Other headache syndrome 08/24/2017   Uncontrolled type 2 diabetes mellitus with hyperglycemia ([place]) 08/24/2017   Type 2 diabetes mellitus without complication, with long-term current use of insulin ([place]) 08/22/2017   Need for vaccination against Streptococcus pneumoniae using pneumococcal conjugate vaccine 13 08/22/2017   Essential hypertension 05/03/2017   Otalgia, bilateral 05/02/2017   Type 2 diabetes mellitus with hyperglycemia ([place]) 05/02/2017   Mixed hyperlipidemia 05/02/2017   Right lower quadrant pain 06/29/2016   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   History of colon cancer 04/13/2015   Cancer of ascending colon ([place]) 10/12/2014   Pura Spice, PT, DPT # [phone] 04/20/2021, 7:04 PM  [facility] [address] [place], [place], [address] Phone: [phone]   Fax:  [phone]  Name: Tiffany Hawkins MRN: [id number] Date of Birth: [birth year]

## 2021-04-20 ENCOUNTER — Ambulatory Visit (INDEPENDENT_AMBULATORY_CARE_PROVIDER_SITE_OTHER): Payer: Medicare HMO

## 2021-04-20 DIAGNOSIS — G4733 Obstructive sleep apnea (adult) (pediatric): Secondary | ICD-10-CM | POA: Diagnosis not present

## 2021-04-20 NOTE — Progress Notes (Signed)
95 percentile pressure 12.2   95th percentile leak 18.3   apnea index 2.3 /hr  apnea-hypopnea index  2.6 /hr   total days used  >4 hr 73 days  total days used <4 hr 13 days  Total compliance 81 percent  We went over mask no other problems or questions at this time Pt was seen by Claiborne Billings  RRT/RCP  from [facility]

## 2021-04-21 ENCOUNTER — Encounter: Payer: Self-pay | Admitting: Physical Therapy

## 2021-04-21 ENCOUNTER — Other Ambulatory Visit: Payer: Self-pay

## 2021-04-21 ENCOUNTER — Ambulatory Visit: Payer: Medicare HMO | Admitting: Physical Therapy

## 2021-04-21 DIAGNOSIS — M545 Low back pain, unspecified: Secondary | ICD-10-CM

## 2021-04-21 DIAGNOSIS — B91 Sequelae of poliomyelitis: Secondary | ICD-10-CM

## 2021-04-21 DIAGNOSIS — Z9181 History of falling: Secondary | ICD-10-CM

## 2021-04-21 DIAGNOSIS — S322XXS Fracture of coccyx, sequela: Secondary | ICD-10-CM | POA: Diagnosis not present

## 2021-04-21 DIAGNOSIS — M89662 Osteopathy after poliomyelitis, left lower leg: Secondary | ICD-10-CM

## 2021-04-21 DIAGNOSIS — S3210XS Unspecified fracture of sacrum, sequela: Secondary | ICD-10-CM

## 2021-04-21 DIAGNOSIS — R29898 Other symptoms and signs involving the musculoskeletal system: Secondary | ICD-10-CM

## 2021-04-21 NOTE — Therapy (Signed)
[facility] [address] [place], [place], [address] Phone: [phone]   Fax:  [phone]  Physical Therapy Treatment  Patient Details  Name: Tiffany Hawkins MRN: [id number] Date of Birth: [birth year] Referring Provider (PT): Girtha Hake MD   Encounter Date: 04/21/2021   PT End of Session - 04/21/21 0915     Visit Number 25    Number of Visits 31    Date for PT Re-Evaluation 05/11/21    Authorization - Visit Number 5    Authorization - Number of Visits 10    Progress Note Due on Visit 10    PT Start Time 0856    PT Stop Time 0947    PT Time Calculation (min) 51 min    Equipment Utilized During Treatment Gait belt   4WW and FWW   Activity Tolerance Patient tolerated treatment well;Patient limited by fatigue   increased unsteadiness after BPPV treatment.   Behavior During Therapy [facility] for tasks assessed/performed             Past Medical History:  Diagnosis Date   Anxiety    Colon cancer ([place]) 10/12/2014   Stage IIB; had chemo   Diabetes mellitus without complication (HCC)    Hyperlipidemia    Hypertension    Lateral epicondylitis    Personal history of chemotherapy    Polio    childhood   Skin cancer    Sqamous Cell, Lt Calf   Sleep apnea    no CPAP, sx improved with wt loss    Past Surgical History:  Procedure Laterality Date   APPENDECTOMY     BACK SURGERY     CATARACT EXTRACTION W/PHACO Left 12/03/2017   Procedure: CATARACT EXTRACTION PHACO AND INTRAOCULAR LENS PLACEMENT ([place]) LEFT  DIABETIC;  Surgeon: Eulogio Bear, MD;  Location: [place];  Service: Ophthalmology;  Laterality: Left;  Diabetic - insulin   CATARACT EXTRACTION W/PHACO Right 01/14/2018   Procedure: CATARACT EXTRACTION PHACO AND INTRAOCULAR LENS PLACEMENT ([facility]) RIGHT DIABETIC;  Surgeon: Eulogio Bear, MD;  Location: [place];  Service: Ophthalmology;  Laterality: Right;  Diabetic - insulin   CERVICAL DISC  SURGERY     CHOLECYSTECTOMY     COLON SURGERY     COLONOSCOPY     COLONOSCOPY WITH PROPOFOL N/A 05/17/2015   Procedure: COLONOSCOPY WITH PROPOFOL;  Surgeon: Manya Silvas, MD;  [facility] ENDOSCOPY;  Service: Endoscopy;  Laterality: N/A;   COLONOSCOPY WITH PROPOFOL N/A 04/01/2018   Procedure: COLONOSCOPY WITH BIOPSY;  Surgeon: [name], MD;  Location: [place];  Service: Endoscopy;  Laterality: N/A;  Diabetic - insulin   EMBOLIZATION N/A 01/24/2021   Procedure: EMBOLIZATION;  Surgeon: Algernon Huxley, MD;  Location: [place] CV LAB;  Service: Cardiovascular;  Laterality: N/A;   ESOPHAGOGASTRODUODENOSCOPY     HERNIA REPAIR     LOWER EXTREMITY VENOGRAPHY Left 01/24/2021   Procedure: LOWER EXTREMITY VENOGRAPHY;  Surgeon: Algernon Huxley, MD;  Location: [place] CV LAB;  Service: Cardiovascular;  Laterality: Left;   POLYPECTOMY N/A 04/01/2018   Procedure: POLYPECTOMY;  Surgeon: [name], MD;  Location: [place];  Service: Endoscopy;  Laterality: N/A;   PORT A CATH INJECTION ([place] HX)     Port has been removed   TUBAL LIGATION      There were no vitals filed for this visit.   Subjective Assessment - 04/21/21 0914     Subjective Pt. reports 4-5/10 R sided  low back pain.  Pt. entered PT clinic with use of SPC and reports the [facility] helps, esp. in the morning.    Pertinent History per referring note: "Patient notes that she does have a history of chronic back pain which did worsen after her fall. She also endorses a history of polio. Over the past month she feels that she has been more off balance and has had more falls. She is also noticing worsening weakness in her legs. She rates her pain as an 8/10. Is intermittent, stiff. She denies any numbness, tingling or loss of control of bowel or bladder. " H/o Hx cancer, previous back surgery, DM, vertigo, Aortic atherosclerosis ([place]).    Limitations Walking;House hold activities;Sitting;Lifting;Standing     Diagnostic tests MRI of lumbar and cervical spine. See imaging history.    Patient Stated Goals Reduce pain and improve balance.    Currently in Pain? Yes    Pain Score 5     Pain Location Back    Pain Orientation Lower;Right    Pain Onset More than a month ago              MH to low back in seated position during LE ex.     There.ex:      Nustep L4 for 10 min. B UE/LE (no charge)   Seated/ standing there.ex. (3# ankle wts.)- hip flexion/ abduction/ LAQ/ hip extension/ heel raises 20x.   Pt. Requires use of UE on //-bars for safety.  Pharmacist, hospital.       Neuro Re-Ed:    Airex (double pad)- step ups 10x progressing from B UE to R UE only in //-bars.      Walking in //-bars with Airex pads/ 6" step ups and overs with B UE assist.  Focus on R quad/ hip control with CGA for safety/ cuing.     Walking in //-bars with high marching/ braiding/ alt. UE and LE touches.         PT Long Term Goals - 04/16/21 1012       PT LONG TERM GOAL #1   Title Pt will improve MMT scores by 1/2 grade to promote functional strength gains for transfers, amb, and decreased caregiver burden.    Baseline 4+/4 Hip flexion  4/4-  Hip abduction  4+/4  Hip adduction  4+/4  Knee extension  4/4 Knee flexion 4/4 Ankle dorsiflexion  4/4 Ankle plantarflexion    Time 4    Period Weeks    Status Partially Met    Target Date 05/11/21      PT LONG TERM GOAL #2   Title Pt will improve BERG score by 5 pts to promote safety with ambulation and functional tasks.    Baseline BERG: 18/56.  11/29: 40/56    Time 4    Period Weeks    Status Partially Met    Target Date 05/11/21      PT LONG TERM GOAL #3   Title Pt will improve FOTO score to predicted improvement value of 56 to measure self reported improvement in functional mobility.    Baseline Initial eval: 36.   11/7: 54 (decrease in back pain but limitations in safe ambulation).  12/28: 68 (goal met)    Time 4    Period Weeks    Status Achieved     Target Date 04/13/21      PT LONG TERM GOAL #4   Title Pt will demonstrates safety with ambulation using FWW with no LOB  and only occ verbal cueing and SBA with 10 minutes of overground walking.    Baseline Pt amb 1 min with extensive verbal cueing CGA-min A.    Time 8    Period Weeks    Status Not Met    Target Date 05/11/21      PT LONG TERM GOAL #5   Title Pt will decrease worse pain to 7/10 or less to promote greater access to functional mobility.    Baseline Present 7/10, Best 4/10, Worst 10/10.  11/29: <3/10 back pain    Time 4    Period Weeks    Status Achieved    Target Date 03/15/21                   Plan - 04/21/21 1147     Clinical Impression Statement Moderate LE muscle fatigue after seated LE resisted ther.ex.  Pt. ambulates with a more consistent gait pattern while using SPC but requires extra time when amb. outside/ stepping over threshold.  Decrease low back pain after MH and tx. session.  No change to HEP and pt. instructed to continue to use [facility] with all aspects of walking.  No LOB during tx. session but moderate LE fatigue and cuing for correct technique with alt. UE/LE touches and braiding.    Personal Factors and Comorbidities Comorbidity 3+;Age;Transportation;Fitness    Comorbidities Hx cancer, previous back surgery, DM, vertigo, Aortic atherosclerosis ([place])    Examination-Activity Limitations Stairs;Reach Overhead;Stand;Dressing;Sit;Transfers;Lift;Caring for Others;Carry;Locomotion Level;Squat    Examination-Participation Restrictions Interpersonal Relationship;Cleaning;Laundry;Yard Work;Driving;Meal Prep;Shop    Stability/Clinical Decision Making Evolving/Moderate complexity    Clinical Decision Making Moderate    Rehab Potential Fair    PT Frequency 2x / week    PT Duration 4 weeks    PT Treatment/Interventions ADLs/Self Care Home Management;Aquatic Therapy;Neuromuscular re-education;Balance training;Therapeutic exercise;Therapeutic  activities;Functional mobility training;Stair training;Gait training;DME Instruction;Patient/family education;Manual techniques;Energy conservation;Biofeedback;Electrical Stimulation;Moist Heat;Traction;Ultrasound;Dry needling;Passive range of motion;Vestibular;Vasopneumatic Device;Spinal Manipulations;Joint Manipulations    PT Next Visit Plan Dynamic balance/ gait activities.    Consulted and Agree with Plan of Care Patient             Patient will benefit from skilled therapeutic intervention in order to improve the following deficits and impairments:  Abnormal gait, Decreased endurance, Decreased activity tolerance, Decreased strength, Pain, Difficulty walking, Decreased mobility, Decreased balance, Decreased range of motion, Improper body mechanics, Postural dysfunction, Decreased safety awareness, Decreased coordination, Decreased knowledge of precautions, Decreased knowledge of use of DME, Dizziness, Hypomobility, Impaired flexibility, Impaired sensation, Impaired perceived functional ability  Visit Diagnosis: Bilateral low back pain, unspecified chronicity, unspecified whether sciatica present  At high risk for injury related to fall  Bilateral leg weakness  Closed fracture of sacrum and coccyx, sequela  Polio osteopathy of lower leg, left [facility])     Problem List Patient Active Problem List   Diagnosis Date Noted   Aortic atherosclerosis ([place]) 12/29/2020   Deformity of toe of left foot 03/28/2020   H/O multiple pulmonary nodules 09/22/2019   Chronic cough 09/22/2019   Perennial allergic rhinitis 09/17/2019   Acute upper respiratory infection 08/28/2019   Weakness 08/28/2019   Acute bronchitis 06/01/2019   Cough productive of yellow sputum 06/01/2019   Vasomotor rhinitis 05/01/2019   Chronic kidney disease, stage II (mild) 03/14/2019   Gastroesophageal reflux disease without esophagitis 03/14/2019   Intractable episodic headache 03/14/2019   Need for prophylactic  vaccination with combined diphtheria-tetanus-pertussis (DTaP) vaccine 03/14/2019   Renal cyst 11/27/2018   Abnormality of gait and  mobility 11/27/2018   Hepatic steatosis 10/31/2018   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[place]) 10/31/2018   Elevated ferritin 10/31/2018   Vertigo 08/13/2018   Gastroenteritis 05/09/2018   Diarrhea 05/09/2018   Nausea 05/09/2018   Personal history of colon cancer    Polyp of sigmoid colon    Dysuria 02/23/2018   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 02/10/2018   Encounter for general adult medical examination with abnormal findings 02/10/2018   Acute non-recurrent pansinusitis 08/24/2017   Other headache syndrome 08/24/2017   Uncontrolled type 2 diabetes mellitus with hyperglycemia ([place]) 08/24/2017   Type 2 diabetes mellitus without complication, with long-term current use of insulin ([place]) 08/22/2017   Need for vaccination against Streptococcus pneumoniae using pneumococcal conjugate vaccine 13 08/22/2017   Essential hypertension 05/03/2017   Otalgia, bilateral 05/02/2017   Type 2 diabetes mellitus with hyperglycemia ([place]) 05/02/2017   Mixed hyperlipidemia 05/02/2017   Right lower quadrant pain 06/29/2016   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   History of colon cancer 04/13/2015   Cancer of ascending colon ([place]) 10/12/2014   Pura Spice, PT, DPT # [phone] 04/21/2021, 12:00 PM  [facility] [address] [place], [place], [address] Phone: [phone]   Fax:  [phone]  Name: ONETHA GAFFEY MRN: [id number] Date of Birth: [birth year]

## 2021-04-22 ENCOUNTER — Telehealth: Payer: Self-pay

## 2021-04-22 NOTE — Telephone Encounter (Signed)
Received letter from [employer] that pt was approved to receive assistance thru 03/16/2022 and they will be shipping a four month's worth of the medication NOVOLOG MIX 70/30

## 2021-04-26 ENCOUNTER — Other Ambulatory Visit: Payer: Self-pay

## 2021-04-26 ENCOUNTER — Encounter: Payer: Self-pay | Admitting: Physical Therapy

## 2021-04-26 ENCOUNTER — Ambulatory Visit: Payer: Medicare HMO | Admitting: Physical Therapy

## 2021-04-26 DIAGNOSIS — M545 Low back pain, unspecified: Secondary | ICD-10-CM

## 2021-04-26 DIAGNOSIS — S3210XS Unspecified fracture of sacrum, sequela: Secondary | ICD-10-CM

## 2021-04-26 DIAGNOSIS — R29898 Other symptoms and signs involving the musculoskeletal system: Secondary | ICD-10-CM

## 2021-04-26 DIAGNOSIS — S322XXS Fracture of coccyx, sequela: Secondary | ICD-10-CM

## 2021-04-26 DIAGNOSIS — Z9181 History of falling: Secondary | ICD-10-CM

## 2021-04-26 DIAGNOSIS — M89662 Osteopathy after poliomyelitis, left lower leg: Secondary | ICD-10-CM

## 2021-04-26 DIAGNOSIS — B91 Sequelae of poliomyelitis: Secondary | ICD-10-CM | POA: Diagnosis not present

## 2021-04-26 NOTE — Therapy (Signed)
[facility] [address] [place], [place], [address] Phone: [phone]   Fax:  [phone]  Physical Therapy Treatment  Patient Details  Name: Tiffany Hawkins MRN: [id number] Date of Birth: [birth year] Referring Provider (PT): Girtha Hake MD   Encounter Date: 04/26/2021   PT End of Session - 04/26/21 1344     Visit Number 26    Number of Visits 31    Date for PT Re-Evaluation 05/11/21    Authorization - Visit Number 6    Authorization - Number of Visits 10    Progress Note Due on Visit 10    PT Start Time 7169    PT Stop Time 1434    PT Time Calculation (min) 52 min    Equipment Utilized During Treatment Gait belt   4WW and FWW   Activity Tolerance Patient tolerated treatment well;Patient limited by fatigue   increased unsteadiness after BPPV treatment.   Behavior During Therapy [facility] for tasks assessed/performed             Past Medical History:  Diagnosis Date   Anxiety    Colon cancer ([place]) 10/12/2014   Stage IIB; had chemo   Diabetes mellitus without complication (HCC)    Hyperlipidemia    Hypertension    Lateral epicondylitis    Personal history of chemotherapy    Polio    childhood   Skin cancer    Sqamous Cell, Lt Calf   Sleep apnea    no CPAP, sx improved with wt loss    Past Surgical History:  Procedure Laterality Date   APPENDECTOMY     BACK SURGERY     CATARACT EXTRACTION W/PHACO Left 12/03/2017   Procedure: CATARACT EXTRACTION PHACO AND INTRAOCULAR LENS PLACEMENT ([place]) LEFT  DIABETIC;  Surgeon: Eulogio Bear, MD;  Location: [place];  Service: Ophthalmology;  Laterality: Left;  Diabetic - insulin   CATARACT EXTRACTION W/PHACO Right 01/14/2018   Procedure: CATARACT EXTRACTION PHACO AND INTRAOCULAR LENS PLACEMENT ([facility]) RIGHT DIABETIC;  Surgeon: Eulogio Bear, MD;  Location: [place];  Service: Ophthalmology;  Laterality: Right;  Diabetic - insulin   CERVICAL DISC  SURGERY     CHOLECYSTECTOMY     COLON SURGERY     COLONOSCOPY     COLONOSCOPY WITH PROPOFOL N/A 05/17/2015   Procedure: COLONOSCOPY WITH PROPOFOL;  Surgeon: Manya Silvas, MD;  Location: [facility] ENDOSCOPY;  Service: Endoscopy;  Laterality: N/A;   COLONOSCOPY WITH PROPOFOL N/A 04/01/2018   Procedure: COLONOSCOPY WITH BIOPSY;  Surgeon: [name], MD;  Location: [place];  Service: Endoscopy;  Laterality: N/A;  Diabetic - insulin   EMBOLIZATION N/A 01/24/2021   Procedure: EMBOLIZATION;  Surgeon: Algernon Huxley, MD;  Location: [employer] CV LAB;  Service: Cardiovascular;  Laterality: N/A;   ESOPHAGOGASTRODUODENOSCOPY     HERNIA REPAIR     LOWER EXTREMITY VENOGRAPHY Left 01/24/2021   Procedure: LOWER EXTREMITY VENOGRAPHY;  Surgeon: Algernon Huxley, MD;  Location: [place] CV LAB;  Service: Cardiovascular;  Laterality: Left;   POLYPECTOMY N/A 04/01/2018   Procedure: POLYPECTOMY;  Surgeon: [name], MD;  Location: [place];  Service: Endoscopy;  Laterality: N/A;   PORT A CATH INJECTION ([place] HX)     Port has been removed   TUBAL LIGATION      There were no vitals filed for this visit.   Subjective Assessment - 04/26/21 1341     Subjective Pt. reports 7/10 R low  back pain prior to tx. session.  Pt. states she did a lot of walking for dog this morning.  Pt. tired today.  Pt. changed sheets on bed and pt. reports increase back discomfort.    Pertinent History per referring note: "Patient notes that she does have a history of chronic back pain which did worsen after her fall. She also endorses a history of polio. Over the past month she feels that she has been more off balance and has had more falls. She is also noticing worsening weakness in her legs. She rates her pain as an 8/10. Is intermittent, stiff. She denies any numbness, tingling or loss of control of bowel or bladder. " H/o Hx cancer, previous back surgery, DM, vertigo, Aortic atherosclerosis ([place]).     Limitations Walking;House hold activities;Sitting;Lifting;Standing    Diagnostic tests MRI of lumbar and cervical spine. See imaging history.    Patient Stated Goals Reduce pain and improve balance.    Currently in Pain? Yes    Pain Score 7     Pain Location Back    Pain Orientation Right;Lower    Pain Descriptors / Indicators Sore    Pain Onset More than a month ago             MH to low back in seated position during LE ex.       There.ex:     Nustep L3 for 10 min. B UE/LE (no charge)  Standing hip ex.: flexion/ abduction/ extension/ heel and toe raises in //-bars. (20x).     Neuro Re-Ed:   STS: 6x from varying heights of blue mat table.    Walking in //-bars with high marching/ alt. UE and LE touches.    Walking in //-bars with 3"/ 6" step ups and overs with B UE assist.  Focus on R quad/ hip control with CGA for safety/ cuing.     Ascend/ descend stairs with recip. Gait pattern and B UE assist on handrail 3x 4 steps.    Alt. UE/LE touches in //-bars and braiding in UE assist (3 laps).          PT Long Term Goals - 04/16/21 1012       PT LONG TERM GOAL #1   Title Pt will improve MMT scores by 1/2 grade to promote functional strength gains for transfers, amb, and decreased caregiver burden.    Baseline 4+/4 Hip flexion  4/4-  Hip abduction  4+/4  Hip adduction  4+/4  Knee extension  4/4 Knee flexion 4/4 Ankle dorsiflexion  4/4 Ankle plantarflexion    Time 4    Period Weeks    Status Partially Met    Target Date 05/11/21      PT LONG TERM GOAL #2   Title Pt will improve BERG score by 5 pts to promote safety with ambulation and functional tasks.    Baseline BERG: 18/56.  11/29: 40/56    Time 4    Period Weeks    Status Partially Met    Target Date 05/11/21      PT LONG TERM GOAL #3   Title Pt will improve FOTO score to predicted improvement value of 56 to measure self reported improvement in functional mobility.    Baseline Initial eval: 36.   11/7: 54  (decrease in back pain but limitations in safe ambulation).  12/28: 68 (goal met)    Time 4    Period Weeks    Status Achieved    Target  Date 04/13/21      PT LONG TERM GOAL #4   Title Pt will demonstrates safety with ambulation using FWW with no LOB and only occ verbal cueing and SBA with 10 minutes of overground walking.    Baseline Pt amb 1 min with extensive verbal cueing CGA-min A.    Time 8    Period Weeks    Status Not Met    Target Date 05/11/21      PT LONG TERM GOAL #5   Title Pt will decrease worse pain to 7/10 or less to promote greater access to functional mobility.    Baseline Present 7/10, Best 4/10, Worst 10/10.  11/29: <3/10 back pain    Time 4    Period Weeks    Status Achieved    Target Date 03/15/21                   Plan - 04/26/21 1437     Clinical Impression Statement Moderate LBP during treatment which impaired pt.'s speed with gait. Moderate LE muscle fatigue after various ambulation exercises at the parallel bars. Pt. hesitated with cross stepping at the parallel bars and required momentary stops and cueing. Pt. required CGA while ambulating without the [facility] and extra time to complete lap. Moderate overall muscle fatigue after session. No change to HEP and pt. instructed to continue walking with SPC.    Personal Factors and Comorbidities Comorbidity 3+;Age;Transportation;Fitness    Comorbidities Hx cancer, previous back surgery, DM, vertigo, Aortic atherosclerosis ([place])    Examination-Activity Limitations Stairs;Reach Overhead;Stand;Dressing;Sit;Transfers;Lift;Caring for Others;Carry;Locomotion Level;Squat    Examination-Participation Restrictions Interpersonal Relationship;Cleaning;Laundry;Yard Work;Driving;Meal Prep;Shop    Stability/Clinical Decision Making Evolving/Moderate complexity    Clinical Decision Making Moderate    Rehab Potential Fair    PT Frequency 2x / week    PT Duration 4 weeks    PT Treatment/Interventions ADLs/Self Care Home  Management;Aquatic Therapy;Neuromuscular re-education;Balance training;Therapeutic exercise;Therapeutic activities;Functional mobility training;Stair training;Gait training;DME Instruction;Patient/family education;Manual techniques;Energy conservation;Biofeedback;Electrical Stimulation;Moist Heat;Traction;Ultrasound;Dry needling;Passive range of motion;Vestibular;Vasopneumatic Device;Spinal Manipulations;Joint Manipulations    PT Next Visit Plan Dynamic balance/ gait activities.    Consulted and Agree with Plan of Care Patient             Patient will benefit from skilled therapeutic intervention in order to improve the following deficits and impairments:  Abnormal gait, Decreased endurance, Decreased activity tolerance, Decreased strength, Pain, Difficulty walking, Decreased mobility, Decreased balance, Decreased range of motion, Improper body mechanics, Postural dysfunction, Decreased safety awareness, Decreased coordination, Decreased knowledge of precautions, Decreased knowledge of use of DME, Dizziness, Hypomobility, Impaired flexibility, Impaired sensation, Impaired perceived functional ability  Visit Diagnosis: Bilateral low back pain, unspecified chronicity, unspecified whether sciatica present  At high risk for injury related to fall  Bilateral leg weakness  Closed fracture of sacrum and coccyx, sequela  Polio osteopathy of lower leg, left [facility])     Problem List Patient Active Problem List   Diagnosis Date Noted   Aortic atherosclerosis ([place]) 12/29/2020   Deformity of toe of left foot 03/28/2020   H/O multiple pulmonary nodules 09/22/2019   Chronic cough 09/22/2019   Perennial allergic rhinitis 09/17/2019   Acute upper respiratory infection 08/28/2019   Weakness 08/28/2019   Acute bronchitis 06/01/2019   Cough productive of yellow sputum 06/01/2019   Vasomotor rhinitis 05/01/2019   Chronic kidney disease, stage II (mild) 03/14/2019   Gastroesophageal reflux disease  without esophagitis 03/14/2019   Intractable episodic headache 03/14/2019   Need for prophylactic vaccination with combined  diphtheria-tetanus-pertussis (DTaP) vaccine 03/14/2019   Renal cyst 11/27/2018   Abnormality of gait and mobility 11/27/2018   Hepatic steatosis 10/31/2018   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[place]) 10/31/2018   Elevated ferritin 10/31/2018   Vertigo 08/13/2018   Gastroenteritis 05/09/2018   Diarrhea 05/09/2018   Nausea 05/09/2018   Personal history of colon cancer    Polyp of sigmoid colon    Dysuria 02/23/2018   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 02/10/2018   Encounter for general adult medical examination with abnormal findings 02/10/2018   Acute non-recurrent pansinusitis 08/24/2017   Other headache syndrome 08/24/2017   Uncontrolled type 2 diabetes mellitus with hyperglycemia ([place]) 08/24/2017   Type 2 diabetes mellitus without complication, with long-term current use of insulin ([place]) 08/22/2017   Need for vaccination against Streptococcus pneumoniae using pneumococcal conjugate vaccine 13 08/22/2017   Essential hypertension 05/03/2017   Otalgia, bilateral 05/02/2017   Type 2 diabetes mellitus with hyperglycemia ([place]) 05/02/2017   Mixed hyperlipidemia 05/02/2017   Right lower quadrant pain 06/29/2016   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   History of colon cancer 04/13/2015   Cancer of ascending colon ([place]) 10/12/2014   Pura Spice, PT, DPT # [phone] 04/26/2021, 3:54 PM  [facility] [address] [place], [place], [address] Phone: [phone]   Fax:  [phone]  Name: Tiffany Hawkins MRN: [id number] Date of Birth: 04-03-1946

## 2021-04-28 ENCOUNTER — Other Ambulatory Visit: Payer: Self-pay

## 2021-04-28 ENCOUNTER — Ambulatory Visit: Payer: Medicare HMO | Admitting: Physical Therapy

## 2021-04-28 ENCOUNTER — Encounter: Payer: Self-pay | Admitting: Physical Therapy

## 2021-04-28 DIAGNOSIS — Z9181 History of falling: Secondary | ICD-10-CM | POA: Diagnosis not present

## 2021-04-28 DIAGNOSIS — M89662 Osteopathy after poliomyelitis, left lower leg: Secondary | ICD-10-CM | POA: Diagnosis not present

## 2021-04-28 DIAGNOSIS — S3210XS Unspecified fracture of sacrum, sequela: Secondary | ICD-10-CM

## 2021-04-28 DIAGNOSIS — R29898 Other symptoms and signs involving the musculoskeletal system: Secondary | ICD-10-CM

## 2021-04-28 DIAGNOSIS — B91 Sequelae of poliomyelitis: Secondary | ICD-10-CM

## 2021-04-28 DIAGNOSIS — S322XXS Fracture of coccyx, sequela: Secondary | ICD-10-CM

## 2021-04-28 DIAGNOSIS — M545 Low back pain, unspecified: Secondary | ICD-10-CM

## 2021-04-28 NOTE — Therapy (Signed)
[facility] [address] [place], [place], [address] Phone: [phone]   Fax:  [phone]  Physical Therapy Treatment  Patient Details  Name: Tiffany Hawkins MRN: [id number] Date of Birth: [birth year] Referring Provider (PT): Girtha Hake MD   Encounter Date: 04/28/2021    PT End of Session - 04/28/21 1359     Visit Number 27    Number of Visits 31    Date for PT Re-Evaluation 05/11/21    Authorization - Visit Number 7    Authorization - Number of Visits 10    Progress Note Due on Visit 10    PT Start Time 3491 to [place] During Treatment Gait belt   4WW and FWW   Activity Tolerance Patient tolerated treatment well;Patient limited by fatigue   increased unsteadiness after BPPV treatment.   Behavior During Therapy [facility] for tasks assessed/performed             Past Medical History:  Diagnosis Date   Anxiety    Colon cancer ([place]) 10/12/2014   Stage IIB; had chemo   Diabetes mellitus without complication (HCC)    Hyperlipidemia    Hypertension    Lateral epicondylitis    Personal history of chemotherapy    Polio    childhood   Skin cancer    Sqamous Cell, Lt Calf   Sleep apnea    no CPAP, sx improved with wt loss    Past Surgical History:  Procedure Laterality Date   APPENDECTOMY     BACK SURGERY     CATARACT EXTRACTION W/PHACO Left 12/03/2017   Procedure: CATARACT EXTRACTION PHACO AND INTRAOCULAR LENS PLACEMENT ([place]) LEFT  DIABETIC;  Surgeon: Eulogio Bear, MD;  Location: [place];  Service: Ophthalmology;  Laterality: Left;  Diabetic - insulin   CATARACT EXTRACTION W/PHACO Right 01/14/2018   Procedure: CATARACT EXTRACTION PHACO AND INTRAOCULAR LENS PLACEMENT ([facility]) RIGHT DIABETIC;  Surgeon: Eulogio Bear, MD;  Location: [place];  Service: Ophthalmology;  Laterality: Right;  Diabetic - insulin   CERVICAL DISC SURGERY     CHOLECYSTECTOMY     COLON SURGERY      COLONOSCOPY     COLONOSCOPY WITH PROPOFOL N/A 05/17/2015   Procedure: COLONOSCOPY WITH PROPOFOL;  Surgeon: Manya Silvas, MD;  Location: [facility] ENDOSCOPY;  Service: Endoscopy;  Laterality: N/A;   COLONOSCOPY WITH PROPOFOL N/A 04/01/2018   Procedure: COLONOSCOPY WITH BIOPSY;  Surgeon: [name], MD;  Location: [place];  Service: Endoscopy;  Laterality: N/A;  Diabetic - insulin   EMBOLIZATION N/A 01/24/2021   Procedure: EMBOLIZATION;  Surgeon: Algernon Huxley, MD;  Location: [place] CV LAB;  Service: Cardiovascular;  Laterality: N/A;   ESOPHAGOGASTRODUODENOSCOPY     HERNIA REPAIR     LOWER EXTREMITY VENOGRAPHY Left 01/24/2021   Procedure: LOWER EXTREMITY VENOGRAPHY;  Surgeon: Algernon Huxley, MD;  Location: [place] CV LAB;  Service: Cardiovascular;  Laterality: Left;   POLYPECTOMY N/A 04/01/2018   Procedure: POLYPECTOMY;  Surgeon: [name], MD;  Location: [place];  Service: Endoscopy;  Laterality: N/A;   PORT A CATH INJECTION ([place] HX)     Port has been removed   TUBAL LIGATION      There were no vitals filed for this visit.   Subjective Assessment - 04/28/21 1357     Subjective Pt. reports she was bending over in kitchen to reach to bottom shelf and fell onto knees.  Pt.  reports R knee soreness/ discomfort (2/10 R knee pain currently).  Pt. required assist from husband to return to stand.    Pertinent History per referring note: "Patient notes that she does have a history of chronic back pain which did worsen after her fall. She also endorses a history of polio. Over the past month she feels that she has been more off balance and has had more falls. She is also noticing worsening weakness in her legs. She rates her pain as an 8/10. Is intermittent, stiff. She denies any numbness, tingling or loss of control of bowel or bladder. " H/o Hx cancer, previous back surgery, DM, vertigo, Aortic atherosclerosis ([place]).    Limitations Walking;House hold  activities;Sitting;Lifting;Standing    Diagnostic tests MRI of lumbar and cervical spine. See imaging history.    Patient Stated Goals Reduce pain and improve balance.    Currently in Pain? Yes    Pain Score 2     Pain Location Knee    Pain Orientation Right    Pain Onset More than a month ago                 There.ex:      Nustep L4 for 10 min. B UE/LE (no charge)  Seated LAQ and marching 20x each.   Standing hip ex.: flexion/ abduction/ extension/ knee flexion/ heel and toe raises in //-bars. (20x).     Neuro Re-Ed:    Walking in hallway with no SPC working on consistent gait pattern/ arm swing/ upright posture.  Discussed sweeping at home.    STS: 10x on gray chair with no UE assist (SBA and cuing for proper posture)   Walking in //-bars with high marching/ alt. UE and LE touches/ turning CW/CCW.  Pharmacist, hospital.        Ascending stairs with recip. Pattern limited by R knee pain today.  Pt. Regressed to step to gait with handrail assist.     Walking outside/ around PT building/ parking lot/ ramp with use of SPC and CGA for safety.      PT Long Term Goals - 04/16/21 1012       PT LONG TERM GOAL #1   Title Pt will improve MMT scores by 1/2 grade to promote functional strength gains for transfers, amb, and decreased caregiver burden.    Baseline 4+/4 Hip flexion  4/4-  Hip abduction  4+/4  Hip adduction  4+/4  Knee extension  4/4 Knee flexion 4/4 Ankle dorsiflexion  4/4 Ankle plantarflexion    Time 4    Period Weeks    Status Partially Met    Target Date 05/11/21      PT LONG TERM GOAL #2   Title Pt will improve BERG score by 5 pts to promote safety with ambulation and functional tasks.    Baseline BERG: 18/56.  11/29: 40/56    Time 4    Period Weeks    Status Partially Met    Target Date 05/11/21      PT LONG TERM GOAL #3   Title Pt will improve FOTO score to predicted improvement value of 56 to measure self reported improvement in functional mobility.     Baseline Initial eval: 36.   11/7: 54 (decrease in back pain but limitations in safe ambulation).  12/28: 68 (goal met)    Time 4    Period Weeks    Status Achieved    Target Date 04/13/21      PT LONG TERM  GOAL #4   Title Pt will demonstrates safety with ambulation using FWW with no LOB and only occ verbal cueing and SBA with 10 minutes of overground walking.    Baseline Pt amb 1 min with extensive verbal cueing CGA-min A.    Time 8    Period Weeks    Status Not Met    Target Date 05/11/21      PT LONG TERM GOAL #5   Title Pt will decrease worse pain to 7/10 or less to promote greater access to functional mobility.    Baseline Present 7/10, Best 4/10, Worst 10/10.  11/29: <3/10 back pain    Time 4    Period Weeks    Status Achieved    Target Date 03/15/21              Limited with R LE step ups secondary to R knee pain. Pt. regressed with stairs today secondary to R knee pain. Pt. remains a high fall risk and easily fatigues as tx. progresses. PT recommends CGA with outside ambulation on sidewalk/ uneven terrain wtih use of SPC due to fall risk. No LOB during tx. session but light UE assist required with standing/ dynamic tasks. Pt. instructed to take rest breaks at home if fatigued and use SPC/RW for safety. Pt. remains resistant to use of RW with home ambulation.       Patient will benefit from skilled therapeutic intervention in order to improve the following deficits and impairments:  Abnormal gait, Decreased endurance, Decreased activity tolerance, Decreased strength, Pain, Difficulty walking, Decreased mobility, Decreased balance, Decreased range of motion, Improper body mechanics, Postural dysfunction, Decreased safety awareness, Decreased coordination, Decreased knowledge of precautions, Decreased knowledge of use of DME, Dizziness, Hypomobility, Impaired flexibility, Impaired sensation, Impaired perceived functional ability  Visit Diagnosis: Bilateral low back  pain, unspecified chronicity, unspecified whether sciatica present  At high risk for injury related to fall  Bilateral leg weakness  Closed fracture of sacrum and coccyx, sequela  Polio osteopathy of lower leg, left [facility])     Problem List Patient Active Problem List   Diagnosis Date Noted   Aortic atherosclerosis ([place]) 12/29/2020   Deformity of toe of left foot 03/28/2020   H/O multiple pulmonary nodules 09/22/2019   Chronic cough 09/22/2019   Perennial allergic rhinitis 09/17/2019   Acute upper respiratory infection 08/28/2019   Weakness 08/28/2019   Acute bronchitis 06/01/2019   Cough productive of yellow sputum 06/01/2019   Vasomotor rhinitis 05/01/2019   Chronic kidney disease, stage II (mild) 03/14/2019   Gastroesophageal reflux disease without esophagitis 03/14/2019   Intractable episodic headache 03/14/2019   Need for prophylactic vaccination with combined diphtheria-tetanus-pertussis (DTaP) vaccine 03/14/2019   Renal cyst 11/27/2018   Abnormality of gait and mobility 11/27/2018   Hepatic steatosis 10/31/2018   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[place]) 10/31/2018   Elevated ferritin 10/31/2018   Vertigo 08/13/2018   Gastroenteritis 05/09/2018   Diarrhea 05/09/2018   Nausea 05/09/2018   Personal history of colon cancer    Polyp of sigmoid colon    Dysuria 02/23/2018   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 02/10/2018   Encounter for general adult medical examination with abnormal findings 02/10/2018   Acute non-recurrent pansinusitis 08/24/2017   Other headache syndrome 08/24/2017   Uncontrolled type 2 diabetes mellitus with hyperglycemia ([place]) 08/24/2017   Type 2 diabetes mellitus without complication, with long-term current use of insulin ([place]) 08/22/2017   Need for vaccination against Streptococcus pneumoniae using pneumococcal  conjugate vaccine 13 08/22/2017   Essential hypertension 05/03/2017   Otalgia, bilateral 05/02/2017   Type 2 diabetes  mellitus with hyperglycemia ([place]) 05/02/2017   Mixed hyperlipidemia 05/02/2017   Right lower quadrant pain 06/29/2016   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 [place], [place], [address] Phone: [phone]   Fax:  [phone]  Name: SOFHIA ULIBARRI MRN: [id number] Date of Birth: [birth year]

## 2021-05-03 ENCOUNTER — Ambulatory Visit: Payer: Medicare HMO | Admitting: Physical Therapy

## 2021-05-03 ENCOUNTER — Encounter: Payer: Self-pay | Admitting: Physical Therapy

## 2021-05-03 ENCOUNTER — Other Ambulatory Visit: Payer: Self-pay

## 2021-05-03 DIAGNOSIS — S3210XS Unspecified fracture of sacrum, sequela: Secondary | ICD-10-CM | POA: Diagnosis not present

## 2021-05-03 DIAGNOSIS — E119 Type 2 diabetes mellitus without complications: Secondary | ICD-10-CM | POA: Diagnosis not present

## 2021-05-03 DIAGNOSIS — M89662 Osteopathy after poliomyelitis, left lower leg: Secondary | ICD-10-CM | POA: Diagnosis not present

## 2021-05-03 DIAGNOSIS — R29898 Other symptoms and signs involving the musculoskeletal system: Secondary | ICD-10-CM

## 2021-05-03 DIAGNOSIS — I10 Essential (primary) hypertension: Secondary | ICD-10-CM | POA: Diagnosis not present

## 2021-05-03 DIAGNOSIS — B91 Sequelae of poliomyelitis: Secondary | ICD-10-CM | POA: Diagnosis not present

## 2021-05-03 DIAGNOSIS — M545 Low back pain, unspecified: Secondary | ICD-10-CM | POA: Diagnosis not present

## 2021-05-03 DIAGNOSIS — Z9181 History of falling: Secondary | ICD-10-CM | POA: Diagnosis not present

## 2021-05-03 DIAGNOSIS — E559 Vitamin D deficiency, unspecified: Secondary | ICD-10-CM | POA: Diagnosis not present

## 2021-05-03 DIAGNOSIS — I700 Atherosclerosis of aorta: Secondary | ICD-10-CM | POA: Diagnosis not present

## 2021-05-03 DIAGNOSIS — S322XXS Fracture of coccyx, sequela: Secondary | ICD-10-CM | POA: Diagnosis not present

## 2021-05-03 DIAGNOSIS — Z794 Long term (current) use of insulin: Secondary | ICD-10-CM | POA: Diagnosis not present

## 2021-05-03 NOTE — Therapy (Addendum)
[facility] [address] [place], [place], [address] Phone: [phone]   Fax:  [phone]  Physical Therapy Treatment  Patient Details  Name: Tiffany Hawkins MRN: [id number] Date of Birth: Aug 03, 1945 Referring Provider (PT): Girtha Hake MD   Encounter Date: 05/03/2021   PT End of Session - 05/03/21 1437     Visit Number 28    Number of Visits 31    Date for PT Re-Evaluation 05/11/21    Authorization - Visit Number 8    Authorization - Number of Visits 10    Progress Note Due on Visit 10    PT Start Time 2585    PT Stop Time 2778    PT Time Calculation (min) 52 min    Equipment Utilized During Treatment Gait belt   4WW and FWW   Activity Tolerance Patient tolerated treatment well;Patient limited by fatigue   increased unsteadiness after BPPV treatment.   Behavior During Therapy [facility] for tasks assessed/performed             Past Medical History:  Diagnosis Date   Anxiety    Colon cancer ([place]) 10/12/2014   Stage IIB; had chemo   Diabetes mellitus without complication (HCC)    Hyperlipidemia    Hypertension    Lateral epicondylitis    Personal history of chemotherapy    Polio    childhood   Skin cancer    Sqamous Cell, Lt Calf   Sleep apnea    no CPAP, sx improved with wt loss    Past Surgical History:  Procedure Laterality Date   APPENDECTOMY     BACK SURGERY     CATARACT EXTRACTION W/PHACO Left 12/03/2017   Procedure: CATARACT EXTRACTION PHACO AND INTRAOCULAR LENS PLACEMENT ([place]) LEFT  DIABETIC;  Surgeon: Eulogio Bear, MD;  Location: [place];  Service: Ophthalmology;  Laterality: Left;  Diabetic - insulin   CATARACT EXTRACTION W/PHACO Right 01/14/2018   Procedure: CATARACT EXTRACTION PHACO AND INTRAOCULAR LENS PLACEMENT ([facility]) RIGHT DIABETIC;  Surgeon: Eulogio Bear, MD;  Location: [place];  Service: Ophthalmology;  Laterality: Right;  Diabetic - insulin   CERVICAL DISC  SURGERY     CHOLECYSTECTOMY     COLON SURGERY     COLONOSCOPY     COLONOSCOPY WITH PROPOFOL N/A 05/17/2015   Procedure: COLONOSCOPY WITH PROPOFOL;  Surgeon: Manya Silvas, MD;  Location: [facility] ENDOSCOPY;  Service: Endoscopy;  Laterality: N/A;   COLONOSCOPY WITH PROPOFOL N/A 04/01/2018   Procedure: COLONOSCOPY WITH BIOPSY;  Surgeon: [name], MD;  Location: [place];  Service: Endoscopy;  Laterality: N/A;  Diabetic - insulin   EMBOLIZATION N/A 01/24/2021   Procedure: EMBOLIZATION;  Surgeon: Algernon Huxley, MD;  Location: [place] CV LAB;  Service: Cardiovascular;  Laterality: N/A;   ESOPHAGOGASTRODUODENOSCOPY     HERNIA REPAIR     LOWER EXTREMITY VENOGRAPHY Left 01/24/2021   Procedure: LOWER EXTREMITY VENOGRAPHY;  Surgeon: Algernon Huxley, MD;  Location: [place] CV LAB;  Service: Cardiovascular;  Laterality: Left;   POLYPECTOMY N/A 04/01/2018   Procedure: POLYPECTOMY;  Surgeon: [name], MD;  Location: [place];  Service: Endoscopy;  Laterality: N/A;   PORT A CATH INJECTION ([place] HX)     Port has been removed   TUBAL LIGATION      There were no vitals filed for this visit.   Subjective Assessment - 05/03/21 1435     Subjective Pt. enters PT with no  assistive device and is reminded by the therapist the benefits of using  SPC.  Pt. has no pain at the beginning of the session and states that she needed soem assistance with ambulation during church over the weekend.    Pertinent History per referring note: "Patient notes that she does have a history of chronic back pain which did worsen after her fall. She also endorses a history of polio. Over the past month she feels that she has been more off balance and has had more falls. She is also noticing worsening weakness in her legs. She rates her pain as an 8/10. Is intermittent, stiff. She denies any numbness, tingling or loss of control of bowel or bladder. " H/o Hx cancer, previous back surgery, DM, vertigo,  Aortic atherosclerosis ([place]).    Limitations Walking;House hold activities;Sitting;Lifting;Standing    Diagnostic tests MRI of lumbar and cervical spine. See imaging history.    Patient Stated Goals Reduce pain and improve balance.    Currently in Pain? No/denies    Pain Score 0-No pain    Pain Onset More than a month ago              There.ex:      Nustep L4 for 10 min. B UE/LE (no charge)       Neuro Re-Ed:    Walking in //-bars with high marching over 6" green hurdles/ balancing on [employer] pad. Pt. Required CGA and worked on minimizing UE assist. Pt. Was cued to stand up tall and take steps shoulder width apart while going over the hurdles for BOS. Pharmacist, hospital. 2 laps  Walking in //-bars picking up cones. Pt. Was cued to use knees to reach down and required some UE assist with PT CGA. Pt. noted and increase in R knee pain while picking up cones on her R side.   Walking in hallway with CGA and SPC.  Pt. Noticed extreme fatigue and had an ataxic gait when walking to her car.         PT Long Term Goals - 04/16/21 1012       PT LONG TERM GOAL #1   Title Pt will improve MMT scores by 1/2 grade to promote functional strength gains for transfers, amb, and decreased caregiver burden.    Baseline 4+/4 Hip flexion  4/4-  Hip abduction  4+/4  Hip adduction  4+/4  Knee extension  4/4 Knee flexion 4/4 Ankle dorsiflexion  4/4 Ankle plantarflexion    Time 4    Period Weeks    Status Partially Met    Target Date 05/11/21      PT LONG TERM GOAL #2   Title Pt will improve BERG score by 5 pts to promote safety with ambulation and functional tasks.    Baseline BERG: 18/56.  11/29: 40/56    Time 4    Period Weeks    Status Partially Met    Target Date 05/11/21      PT LONG TERM GOAL #3   Title Pt will improve FOTO score to predicted improvement value of 56 to measure self reported improvement in functional mobility.    Baseline Initial eval: 36.   11/7: 54 (decrease in back pain  but limitations in safe ambulation).  12/28: 68 (goal met)    Time 4    Period Weeks    Status Achieved    Target Date 04/13/21      PT LONG TERM GOAL #4   Title Pt will demonstrates  safety with ambulation using FWW with no LOB and only occ verbal cueing and SBA with 10 minutes of overground walking.    Baseline Pt amb 1 min with extensive verbal cueing CGA-min A.    Time 8    Period Weeks    Status Not Met    Target Date 05/11/21      PT LONG TERM GOAL #5   Title Pt will decrease worse pain to 7/10 or less to promote greater access to functional mobility.    Baseline Present 7/10, Best 4/10, Worst 10/10.  11/29: <3/10 back pain    Time 4    Period Weeks    Status Achieved    Target Date 03/15/21                   Plan - 05/03/21 1438     Clinical Impression Statement Pt. is able to ambulate with CGA and UE assist in //-bars but is limited due to fatigue. Pt. continues to ambulate in clinic with use of SPC and is cued to take wide steps to increase BOS and flex hip during swing phase. Pt. does feel extreme fatigue towards end of session and has a slowed candence when walking. Pt. will continue to benefit with practicing the use of a SPC and strengthening her LE to allow for a safer gait pattern.  Pt. had a negative Dix-Hallpike maneuver.    Personal Factors and Comorbidities Comorbidity 3+;Age;Transportation;Fitness    Comorbidities Hx cancer, previous back surgery, DM, vertigo, Aortic atherosclerosis ([place])    Examination-Activity Limitations Stairs;Reach Overhead;Stand;Dressing;Sit;Transfers;Lift;Caring for Others;Carry;Locomotion Level;Squat    Examination-Participation Restrictions Interpersonal Relationship;Cleaning;Laundry;Yard Work;Driving;Meal Prep;Shop    Stability/Clinical Decision Making Evolving/Moderate complexity    Rehab Potential Fair    PT Frequency 2x / week    PT Duration 4 weeks    PT Treatment/Interventions ADLs/Self Care Home Management;Aquatic  Therapy;Neuromuscular re-education;Balance training;Therapeutic exercise;Therapeutic activities;Functional mobility training;Stair training;Gait training;DME Instruction;Patient/family education;Manual techniques;Energy conservation;Biofeedback;Electrical Stimulation;Moist Heat;Traction;Ultrasound;Dry needling;Passive range of motion;Vestibular;Vasopneumatic Device;Spinal Manipulations;Joint Manipulations    PT Next Visit Plan Dynamic balance/ gait activities.    Consulted and Agree with Plan of Care Patient             Patient will benefit from skilled therapeutic intervention in order to improve the following deficits and impairments:  Abnormal gait, Decreased endurance, Decreased activity tolerance, Decreased strength, Pain, Difficulty walking, Decreased mobility, Decreased balance, Decreased range of motion, Improper body mechanics, Postural dysfunction, Decreased safety awareness, Decreased coordination, Decreased knowledge of precautions, Decreased knowledge of use of DME, Dizziness, Hypomobility, Impaired flexibility, Impaired sensation, Impaired perceived functional ability  Visit Diagnosis: At high risk for injury related to fall  Bilateral leg weakness  Bilateral low back pain, unspecified chronicity, unspecified whether sciatica present     Problem List Patient Active Problem List   Diagnosis Date Noted   Aortic atherosclerosis ([place]) 12/29/2020   Deformity of toe of left foot 03/28/2020   H/O multiple pulmonary nodules 09/22/2019   Chronic cough 09/22/2019   Perennial allergic rhinitis 09/17/2019   Acute upper respiratory infection 08/28/2019   Weakness 08/28/2019   Acute bronchitis 06/01/2019   Cough productive of yellow sputum 06/01/2019   Vasomotor rhinitis 05/01/2019   Chronic kidney disease, stage II (mild) 03/14/2019   Gastroesophageal reflux disease without esophagitis 03/14/2019   Intractable episodic headache 03/14/2019   Need for prophylactic vaccination  with combined diphtheria-tetanus-pertussis (DTaP) vaccine 03/14/2019   Renal cyst 11/27/2018   Abnormality of gait and mobility 11/27/2018   Hepatic  steatosis 10/31/2018   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[place]) 10/31/2018   Elevated ferritin 10/31/2018   Vertigo 08/13/2018   Gastroenteritis 05/09/2018   Diarrhea 05/09/2018   Nausea 05/09/2018   Personal history of colon cancer    Polyp of sigmoid colon    Dysuria 02/23/2018   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 02/10/2018   Encounter for general adult medical examination with abnormal findings 02/10/2018   Acute non-recurrent pansinusitis 08/24/2017   Other headache syndrome 08/24/2017   Uncontrolled type 2 diabetes mellitus with hyperglycemia ([place]) 08/24/2017   Type 2 diabetes mellitus without complication, with long-term current use of insulin ([place]) 08/22/2017   Need for vaccination against Streptococcus pneumoniae using pneumococcal conjugate vaccine 13 08/22/2017   Essential hypertension 05/03/2017   Otalgia, bilateral 05/02/2017   Type 2 diabetes mellitus with hyperglycemia ([place]) 05/02/2017   Mixed hyperlipidemia 05/02/2017   Right lower quadrant pain 06/29/2016   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   History of colon cancer 04/13/2015   Cancer of ascending colon ([place]) 10/12/2014   Pura Spice, PT, DPT # [place], SPT 05/03/2021, 5:58 PM  [facility] [address] [place], [place], [address] Phone: [phone]   Fax:  [phone]  Name: KIMONI PICKERILL MRN: [id number] Date of Birth: 1945-07-21

## 2021-05-04 ENCOUNTER — Ambulatory Visit (INDEPENDENT_AMBULATORY_CARE_PROVIDER_SITE_OTHER): Payer: Medicare HMO | Admitting: Nurse Practitioner

## 2021-05-04 ENCOUNTER — Encounter: Payer: Self-pay | Admitting: Nurse Practitioner

## 2021-05-04 VITALS — BP 127/73 | HR 81 | Temp 97.6°F | Resp 16 | Ht 62.0 in | Wt 154.4 lb

## 2021-05-04 DIAGNOSIS — E119 Type 2 diabetes mellitus without complications: Secondary | ICD-10-CM | POA: Diagnosis not present

## 2021-05-04 DIAGNOSIS — E782 Mixed hyperlipidemia: Secondary | ICD-10-CM | POA: Diagnosis not present

## 2021-05-04 DIAGNOSIS — N1832 Chronic kidney disease, stage 3b: Secondary | ICD-10-CM | POA: Diagnosis not present

## 2021-05-04 DIAGNOSIS — Z23 Encounter for immunization: Secondary | ICD-10-CM | POA: Diagnosis not present

## 2021-05-04 DIAGNOSIS — Z794 Long term (current) use of insulin: Secondary | ICD-10-CM

## 2021-05-04 DIAGNOSIS — E559 Vitamin D deficiency, unspecified: Secondary | ICD-10-CM

## 2021-05-04 LAB — CBC WITH DIFFERENTIAL/PLATELET
Basophils Absolute: 0 10*3/uL (ref 0.0–0.2)
Basos: 0 %
EOS (ABSOLUTE): 0.2 10*3/uL (ref 0.0–0.4)
Eos: 3 %
Hematocrit: 48.5 % — ABNORMAL HIGH (ref 34.0–46.6)
Hemoglobin: 15.7 g/dL (ref 11.1–15.9)
Immature Grans (Abs): 0.1 10*3/uL (ref 0.0–0.1)
Immature Granulocytes: 1 %
Lymphocytes Absolute: 2.2 10*3/uL (ref 0.7–3.1)
Lymphs: 29 %
MCH: 28.1 pg (ref 26.6–33.0)
MCHC: 32.4 g/dL (ref 31.5–35.7)
MCV: 87 fL (ref 79–97)
Monocytes Absolute: 0.9 10*3/uL (ref 0.1–0.9)
Monocytes: 11 %
Neutrophils Absolute: 4.2 10*3/uL (ref 1.4–7.0)
Neutrophils: 56 %
Platelets: 197 10*3/uL (ref 150–450)
RBC: 5.58 x10E6/uL — ABNORMAL HIGH (ref 3.77–5.28)
RDW: 13.2 % (ref 11.7–15.4)
WBC: 7.6 10*3/uL (ref 3.4–10.8)

## 2021-05-04 LAB — POCT GLYCOSYLATED HEMOGLOBIN (HGB A1C): Hemoglobin A1C: 7.5 % — AB (ref 4.0–5.6)

## 2021-05-04 LAB — CMP14+EGFR
ALT: 18 IU/L (ref 0–32)
AST: 17 IU/L (ref 0–40)
Albumin/Globulin Ratio: 1.9 (ref 1.2–2.2)
Albumin: 4.9 g/dL — ABNORMAL HIGH (ref 3.7–4.7)
Alkaline Phosphatase: 56 IU/L (ref 44–121)
BUN/Creatinine Ratio: 17 (ref 12–28)
BUN: 26 mg/dL (ref 8–27)
Bilirubin Total: 0.2 mg/dL (ref 0.0–1.2)
CO2: 24 mmol/L (ref 20–29)
Calcium: 9.9 mg/dL (ref 8.7–10.3)
Chloride: 105 mmol/L (ref 96–106)
Creatinine, Ser: 1.52 mg/dL — ABNORMAL HIGH (ref 0.57–1.00)
Globulin, Total: 2.6 g/dL (ref 1.5–4.5)
Glucose: 158 mg/dL — ABNORMAL HIGH (ref 70–99)
Potassium: 5.3 mmol/L — ABNORMAL HIGH (ref 3.5–5.2)
Sodium: 144 mmol/L (ref 134–144)
Total Protein: 7.5 g/dL (ref 6.0–8.5)
eGFR: 35 mL/min/{1.73_m2} — ABNORMAL LOW (ref >59)

## 2021-05-04 LAB — LIPID PANEL
Chol/HDL Ratio: 4.7 ratio — ABNORMAL HIGH (ref 0.0–4.4)
Cholesterol, Total: 150 mg/dL (ref 100–199)
HDL: 32 mg/dL — ABNORMAL LOW (ref >39)
LDL Chol Calc (NIH): 84 mg/dL (ref 0–99)
Triglycerides: 200 mg/dL — ABNORMAL HIGH (ref 0–149)
VLDL Cholesterol Cal: 34 mg/dL (ref 5–40)

## 2021-05-04 LAB — VITAMIN D 25 HYDROXY (VIT D DEFICIENCY, FRACTURES): Vit D, 25-Hydroxy: 36.2 ng/mL (ref 30.0–100.0)

## 2021-05-04 MED ORDER — PNEUMOCOCCAL 20-VAL CONJ VACC 0.5 ML IM SUSY: 0.5000 mL | PREFILLED_SYRINGE | INTRAMUSCULAR | 0 refills | Status: AC

## 2021-05-04 NOTE — Progress Notes (Signed)
[facility] [place], [place] [address]  Internal MEDICINE  Office Visit Note  Patient Name: Tiffany Hawkins  [id number]  [id number]  Date of Service: 05/04/2021  Chief Complaint  Patient presents with   Follow-up   Diabetes   Hyperlipidemia   Hypertension   Quality Metric Gaps    Pneumonia vaccine needed    HPI Tiffany Hawkins presents for a follow-up visit for diabetes and hypertension and to discuss her recent lab work.  She is also due to have her A1c checked today and it was 7.5 which is slightly elevated from her last A1c in October which was 7.0.  She is aware of diet modifications that she needs to work on to bring her A1c back down. Her vitamin D level is normal but on the lower side at 36.2.  Her creatinine level is slightly more elevated at 1.52 and her GFR is down to 35 which is considered CKD stage 3b.  Her potassium level was also slightly elevated at 5.3.  Her liver function is normal.  Her lipid panel remains abnormal.  Her triglyceride level is elevated at 200 which is improved from a year ago when it was 257 and her HDL level has dropped down to 32 from 35.  Her VLDL level is now normal her LDL is normal and total cholesterol is normal but her total cholesterol/HDL ratio is slightly elevated.  Her CBC is normal except for slight erythrocytosis and elevated hematocrit of 48.5. She is in need of the pneumonia vaccine.      Current Medication: Outpatient Encounter Medications as of 05/04/2021  Medication Sig   albuterol (VENTOLIN HFA) 108 (90 Base) MCG/ACT inhaler Inhale 2 puffs into the lungs every 6 (six) hours as needed for wheezing or shortness of breath.   amLODipine (NORVASC) 5 MG tablet TAKE 2 TABLETS DAILY.   Blood Glucose Monitoring Suppl (TRUE METRIX METER) w/Device KIT USE AS DIRECTED   cetirizine (ZYRTEC) 10 MG chewable tablet Chew 1 tablet (10 mg total) by mouth daily.   chlorpheniramine-HYDROcodone (TUSSIONEX PENNKINETIC ER) 10-8 MG/5ML  SUER Take 5 mLs by mouth at bedtime as needed for cough.   citalopram (CELEXA) 20 MG tablet TAKE 2 TABLETS DAILY FOR DEPRESSION.   Dapagliflozin Propanediol (FARXIGA PO) Take by mouth.   fenofibrate 54 MG tablet Take 1 tablet (54 mg total) by mouth daily.   fluticasone (FLONASE) 50 MCG/ACT nasal spray Place 2 sprays into both nostrils daily.   glucose blood (TRUE METRIX BLOOD GLUCOSE TEST) test strip TEST BLOOD SUGAR THREE TIMES DAILY AFTER MEALS   ibuprofen (ADVIL) 800 MG tablet Take 1 tablet (800 mg total) by mouth every 8 (eight) hours as needed.   insulin aspart protamine - aspart (NOVOLOG MIX 70/30 FLEXPEN) (70-30) 100 UNIT/ML FlexPen Inject 0.35 mLs (35 Units total) into the skin 2 (two) times daily. Inject 35 units [place] QAM. Gradually increase to 40units [place] QPM   lovastatin (MEVACOR) 20 MG tablet TAKE 1 TABLET AT BEDTIME FOR HIGH CHOLESTEROL   Multiple Vitamin (MULTIVITAMIN) tablet Take by mouth daily. TAKES 1/2 TABLET   pantoprazole (PROTONIX) 20 MG tablet Take 2 tablets (40 mg total) by mouth daily.   pneumococcal 20-valent conjugate vaccine (PREVNAR 20) 0.5 ML injection Inject 0.5 mLs into the muscle tomorrow at 10 am for 1 dose.   sharps container 1 each by Does not apply route as needed. To use for insulin syringes and needles. Taking insulin at least twice daily.  E11.65  spironolactone (ALDACTONE) 100 MG tablet Take 100 mg by mouth daily. 2 tablets once a day   TRUEplus Lancets 33G MISC Use as directed three times a day E11.65   No facility-administered encounter medications on file as of 05/04/2021.    Surgical History: Past Surgical History:  Procedure Laterality Date   APPENDECTOMY     BACK SURGERY     CATARACT EXTRACTION W/PHACO Left 12/03/2017   Procedure: CATARACT EXTRACTION PHACO AND INTRAOCULAR LENS PLACEMENT ([place]) LEFT  DIABETIC;  Surgeon: Eulogio Bear, MD;  Location: [place];  Service: Ophthalmology;  Laterality: Left;  Diabetic - insulin   CATARACT  EXTRACTION W/PHACO Right 01/14/2018   Procedure: CATARACT EXTRACTION PHACO AND INTRAOCULAR LENS PLACEMENT ([facility]) RIGHT DIABETIC;  Surgeon: Eulogio Bear, MD;  Location: [place];  Service: Ophthalmology;  Laterality: Right;  Diabetic - insulin   CERVICAL DISC SURGERY     CHOLECYSTECTOMY     COLON SURGERY     COLONOSCOPY     COLONOSCOPY WITH PROPOFOL N/A 05/17/2015   Procedure: COLONOSCOPY WITH PROPOFOL;  Surgeon: Manya Silvas, MD;  Location: [facility] ENDOSCOPY;  Service: Endoscopy;  Laterality: N/A;   COLONOSCOPY WITH PROPOFOL N/A 04/01/2018   Procedure: COLONOSCOPY WITH BIOPSY;  Surgeon: [name], MD;  Location: [place];  Service: Endoscopy;  Laterality: N/A;  Diabetic - insulin   EMBOLIZATION N/A 01/24/2021   Procedure: EMBOLIZATION;  Surgeon: Algernon Huxley, MD;  Location: [place] CV LAB;  Service: Cardiovascular;  Laterality: N/A;   ESOPHAGOGASTRODUODENOSCOPY     HERNIA REPAIR     LOWER EXTREMITY VENOGRAPHY Left 01/24/2021   Procedure: LOWER EXTREMITY VENOGRAPHY;  Surgeon: Algernon Huxley, MD;  Location: [place] CV [facility];  Service: Cardiovascular;  Laterality: Left;   POLYPECTOMY N/A 04/01/2018   Procedure: POLYPECTOMY;  Surgeon: [name], MD;  Location: [place];  Service: Endoscopy;  Laterality: N/A;   PORT A CATH INJECTION ([place] HX)     Port has been removed   TUBAL LIGATION      Medical History: Past Medical History:  Diagnosis Date   Anxiety    Colon cancer ([place]) 10/12/2014   Stage IIB; had chemo   Diabetes mellitus without complication (HCC)    Hyperlipidemia    Hypertension    Lateral epicondylitis    Personal history of chemotherapy    Polio    childhood   Skin cancer    Sqamous Cell, Lt Calf   Sleep apnea    no CPAP, sx improved with wt loss    Family History: Family History  Problem Relation Age of Onset   Alzheimer's disease Mother    Heart disease Father    Colon cancer Cousin    Colon cancer Cousin     Cancer Cousin        Brain Tumor   Breast cancer Neg Hx     Social History   Socioeconomic History   Marital status: Married    Spouse name: Not on file   Number of children: Not on file   Years of education: Not on file   Highest education level: Not on file  Occupational History   Not on file  Tobacco Use   Smoking status: Former    Packs/day: 1.50    Years: 20.00    Pack years: 30.00    Types: Cigarettes    Quit date: 04/17/1981    Years since quitting: 40.0   Smokeless tobacco: Never  Vaping Use  Vaping Use: Never used  Substance and Sexual Activity   Alcohol use: No   Drug use: No   Sexual activity: Yes  Other Topics Concern   Not on file  Social History Narrative   Not on file   Social Determinants of Health   Financial Resource Strain: Low Risk    Difficulty of Paying Living Expenses: Not very hard  Food Insecurity: Not on file  Transportation Needs: Not on file  Physical Activity: Not on file  Stress: Not on file  Social Connections: Not on file  Intimate Partner Violence: Not on file      Review of Systems  Constitutional:  Negative for chills, fatigue and unexpected weight change.  HENT:  Negative for congestion, rhinorrhea, sneezing and sore throat.   Eyes:  Negative for redness.  Respiratory:  Negative for cough, chest tightness and shortness of breath.   Cardiovascular:  Negative for chest pain and palpitations.  Gastrointestinal:  Negative for abdominal pain, constipation, diarrhea, nausea and vomiting.  Genitourinary:  Negative for dysuria and frequency.  Musculoskeletal:  Negative for arthralgias, back pain, joint swelling and neck pain.  Skin:  Negative for rash.  Neurological: Negative.  Negative for tremors and numbness.  Hematological:  Negative for adenopathy. Does not bruise/bleed easily.  Psychiatric/Behavioral:  Negative for behavioral problems (Depression), sleep disturbance and suicidal ideas. The patient is not nervous/anxious.     Vital Signs: BP 127/73    Pulse 81    Temp 97.6 F (36.4 C)    Resp 16    Ht 5' 2"  (1.575 m)    Wt 154 lb 6.4 oz (70 kg)    SpO2 95%    BMI 28.24 kg/m    Physical Exam Vitals reviewed.  Constitutional:      General: She is not in acute distress.    Appearance: Normal appearance. She is normal weight. She is not ill-appearing.  HENT:     Head: Normocephalic and atraumatic.  Eyes:     Pupils: Pupils are equal, round, and reactive to light.  Cardiovascular:     Rate and Rhythm: Normal rate and regular rhythm.  Pulmonary:     Effort: Pulmonary effort is normal. No respiratory distress.  Neurological:     Mental Status: She is alert and oriented to person, place, and time.     Cranial Nerves: No cranial nerve deficit.     Coordination: Coordination normal.     Gait: Gait normal.  Psychiatric:        Mood and Affect: Mood normal.        Behavior: Behavior normal.       Assessment/Plan: 1. Type 2 diabetes mellitus without complication, with long-term current use of insulin (HCC) Her A1C is 7.5 today which is increased since October. Patient has not been watching her diet as much lately. She is aware of changes she needs to make in her diet to bring her A1C down. No change in medication today. If no improvement in her A1C, will adjust medications at next office visit. Will consider restarting farxiga.  - POCT HgB A1C  2. Stage 3b chronic kidney disease (HCC) Creatinine is 1.52, GFR is 35. Information provided to patient about elevated potassium and diet modifications that may be beneficial to her kidney function. Consider restarting farxiga if it is affordable with her insurance.   3. Mixed hyperlipidemia She is currently taking lovastatin 8m daily. Instructed patient to limit red meat in her diet, increase lean proteins and add OTC  fish oil supplement 1000 mg daily if she has not already done this.   4. Vitamin D deficiency Level is low normal right now, encouraged patient  to continue an OTC supplement.   5. Need for vaccination - pneumococcal 20-valent conjugate vaccine (PREVNAR 20) 0.5 ML injection; Inject 0.5 mLs into the muscle tomorrow at 10 am for 1 dose.  Dispense: 0.5 mL; Refill: 0   General Counseling: Tiffany Hawkins verbalizes understanding of the findings of todays visit and agrees with plan of treatment. I have discussed any further diagnostic evaluation that may be needed or ordered today. We also reviewed her medications today. she has been encouraged to call the office with any questions or concerns that should arise related to todays visit.    Orders Placed This Encounter  Procedures   POCT HgB A1C    Meds ordered this encounter  Medications   pneumococcal 20-valent conjugate vaccine (PREVNAR 20) 0.5 ML injection    Sig: Inject 0.5 mLs into the muscle tomorrow at 10 am for 1 dose.    Dispense:  0.5 mL    Refill:  0    Return in about 3 months (around 08/02/2021) for F/U, Recheck A1C, Tiffany Hawkins PCP.   Total time spent:30 Minutes Time spent includes review of chart, medications, test results, and follow up plan with the patient.   [place] Controlled Substance Database was reviewed by me.  This patient was seen by Jonetta Osgood, FNP-C in collaboration with Dr. Clayborn Bigness as a part of collaborative care agreement.   Tiffany Boys R. Valetta Fuller, MSN, FNP-C Internal medicine

## 2021-05-05 ENCOUNTER — Ambulatory Visit: Payer: Medicare HMO | Admitting: Physical Therapy

## 2021-05-05 ENCOUNTER — Other Ambulatory Visit: Payer: Self-pay

## 2021-05-05 DIAGNOSIS — M89662 Osteopathy after poliomyelitis, left lower leg: Secondary | ICD-10-CM

## 2021-05-05 DIAGNOSIS — Z9181 History of falling: Secondary | ICD-10-CM | POA: Diagnosis not present

## 2021-05-05 DIAGNOSIS — B91 Sequelae of poliomyelitis: Secondary | ICD-10-CM | POA: Diagnosis not present

## 2021-05-05 DIAGNOSIS — S3210XS Unspecified fracture of sacrum, sequela: Secondary | ICD-10-CM | POA: Diagnosis not present

## 2021-05-05 DIAGNOSIS — M545 Low back pain, unspecified: Secondary | ICD-10-CM | POA: Diagnosis not present

## 2021-05-05 DIAGNOSIS — R29898 Other symptoms and signs involving the musculoskeletal system: Secondary | ICD-10-CM | POA: Diagnosis not present

## 2021-05-05 DIAGNOSIS — S322XXS Fracture of coccyx, sequela: Secondary | ICD-10-CM | POA: Diagnosis not present

## 2021-05-05 NOTE — Therapy (Signed)
[facility] [address] [place], [place], [address] Phone: [phone]   Fax:  [phone]  Physical Therapy Treatment  Patient Details  Name: Tiffany Hawkins MRN: [id number] Date of Birth: 18-Sep-1945 Referring Provider (PT): Girtha Hake MD   Encounter Date: 05/05/2021   PT End of Session - 05/05/21 1417     Visit Number 29    Number of Visits 31    Date for PT Re-Evaluation 05/11/21    Authorization - Visit Number 8    Authorization - Number of Visits 10    Progress Note Due on Visit 10    PT Start Time 9470    PT Stop Time 1436    PT Time Calculation (min) 51 min    Equipment Utilized During Treatment Gait belt   4WW and FWW   Activity Tolerance Patient tolerated treatment well;Patient limited by fatigue   increased unsteadiness after BPPV treatment.   Behavior During Therapy [facility] for tasks assessed/performed             Past Medical History:  Diagnosis Date   Anxiety    Colon cancer ([place]) 10/12/2014   Stage IIB; had chemo   Diabetes mellitus without complication (HCC)    Hyperlipidemia    Hypertension    Lateral epicondylitis    Personal history of chemotherapy    Polio    childhood   Skin cancer    Sqamous Cell, Lt Calf   Sleep apnea    no CPAP, sx improved with wt loss    Past Surgical History:  Procedure Laterality Date   APPENDECTOMY     BACK SURGERY     CATARACT EXTRACTION W/PHACO Left 12/03/2017   Procedure: CATARACT EXTRACTION PHACO AND INTRAOCULAR LENS PLACEMENT ([place]) LEFT  DIABETIC;  Surgeon: Eulogio Bear, MD;  Location: [place];  Service: Ophthalmology;  Laterality: Left;  Diabetic - insulin   CATARACT EXTRACTION W/PHACO Right 01/14/2018   Procedure: CATARACT EXTRACTION PHACO AND INTRAOCULAR LENS PLACEMENT ([facility]) RIGHT DIABETIC;  Surgeon: Eulogio Bear, MD;  Location: [place];  Service: Ophthalmology;  Laterality: Right;  Diabetic - insulin   CERVICAL DISC  SURGERY     CHOLECYSTECTOMY     COLON SURGERY     COLONOSCOPY     COLONOSCOPY WITH PROPOFOL N/A 05/17/2015   Procedure: COLONOSCOPY WITH PROPOFOL;  Surgeon: Manya Silvas, MD;  Location: [facility] ENDOSCOPY;  Service: Endoscopy;  Laterality: N/A;   COLONOSCOPY WITH PROPOFOL N/A 04/01/2018   Procedure: COLONOSCOPY WITH BIOPSY;  Surgeon: [name], MD;  Location: [place];  Service: Endoscopy;  Laterality: N/A;  Diabetic - insulin   EMBOLIZATION N/A 01/24/2021   Procedure: EMBOLIZATION;  Surgeon: Algernon Huxley, MD;  Location: [place] CV [facility];  Service: Cardiovascular;  Laterality: N/A;   ESOPHAGOGASTRODUODENOSCOPY     HERNIA REPAIR     LOWER EXTREMITY VENOGRAPHY Left 01/24/2021   Procedure: LOWER EXTREMITY VENOGRAPHY;  Surgeon: Algernon Huxley, MD;  Location: [place] [facility];  Service: Cardiovascular;  Laterality: Left;   POLYPECTOMY N/A 04/01/2018   Procedure: POLYPECTOMY;  Surgeon: [name], MD;  Location: [place];  Service: Endoscopy;  Laterality: N/A;   PORT A CATH INJECTION ([place] HX)     Port has been removed   TUBAL LIGATION      There were no vitals filed for this visit.   Subjective Assessment - 05/05/21 1358     Subjective Pt. enters PT with SPC.  Pt. had MD appointment and was informed she has chronic kidney disease. Pt. has no pain at the beginning of the session. Pt. is still hesitant to ambulate with SPC and would prefer to walk without an assistive device but acknowledges the risks of not using a device.    Pertinent History per referring note: "Patient notes that she does have a history of chronic back pain which did worsen after her fall. She also endorses a history of polio. Over the past month she feels that she has been more off balance and has had more falls. She is also noticing worsening weakness in her legs. She rates her pain as an 8/10. Is intermittent, stiff. She denies any numbness, tingling or loss of control of bowel or bladder. "  H/o Hx cancer, previous back surgery, DM, vertigo, Aortic atherosclerosis ([place]).    Limitations Walking;House hold activities;Sitting;Lifting;Standing    Diagnostic tests MRI of lumbar and cervical spine. See imaging history.    Patient Stated Goals Reduce pain and improve balance.    Currently in Pain? No/denies    Pain Score 0-No pain    Pain Onset More than a month ago               There.ex:      Nustep L4 for 5 min. B UE/LE (no charge)  Seated LAQ 20x Seated marches 20x   Reviewed HEP    Neuro Re-Ed:    Walking in //-bars with medicine ball (6+). Forward/Lateral   Walking to car.  Pt. Noticed extreme fatigue and had an ataxic gait when walking to her car. Pt. Was cued to keep upright posture and increase step width to increase BOS.  DGI (7/24)       PT Long Term Goals - 04/16/21 1012       PT LONG TERM GOAL #1   Title Pt will improve MMT scores by 1/2 grade to promote functional strength gains for transfers, amb, and decreased caregiver burden.    Baseline 4+/4 Hip flexion  4/4-  Hip abduction  4+/4  Hip adduction  4+/4  Knee extension  4/4 Knee flexion 4/4 Ankle dorsiflexion  4/4 Ankle plantarflexion    Time 4    Period Weeks    Status Partially Met    Target Date 05/11/21      PT LONG TERM GOAL #2   Title Pt will improve BERG score by 5 pts to promote safety with ambulation and functional tasks.    Baseline BERG: 18/56.  11/29: 40/56    Time 4    Period Weeks    Status Partially Met    Target Date 05/11/21      PT LONG TERM GOAL #3   Title Pt will improve FOTO score to predicted improvement value of 56 to measure self reported improvement in functional mobility.    Baseline Initial eval: 36.   11/7: 54 (decrease in back pain but limitations in safe ambulation).  12/28: 68 (goal met)    Time 4    Period Weeks    Status Achieved    Target Date 04/13/21      PT LONG TERM GOAL #4   Title Pt will demonstrates safety with ambulation using FWW with no  LOB and only occ verbal cueing and SBA with 10 minutes of overground walking.    Baseline Pt amb 1 min with extensive verbal cueing CGA-min A.    Time 8    Period Weeks    Status Not  Met    Target Date 05/11/21      PT LONG TERM GOAL #5   Title Pt will decrease worse pain to 7/10 or less to promote greater access to functional mobility.    Baseline Present 7/10, Best 4/10, Worst 10/10.  11/29: <3/10 back pain    Time 4    Period Weeks    Status Achieved    Target Date 03/15/21                   Plan - 05/05/21 1418     Clinical Impression Statement Pt. is able to ambulate with CGA around clinic. Pt. is limited by fatigue and the more she walks, increases her fall risk. Pt. perfromed a DGI (7/24). Pt. will continue to benefit from education on ambulation with a SPC and endurance exercises to help reduce fatigue. Pt. presented with significant LE fatigue post tx.    Personal Factors and Comorbidities Comorbidity 3+;Age;Transportation;Fitness    Comorbidities Hx cancer, previous back surgery, DM, vertigo, Aortic atherosclerosis ([place])    Examination-Activity Limitations Stairs;Reach Overhead;Stand;Dressing;Sit;Transfers;Lift;Caring for Others;Carry;Locomotion Level;Squat    Examination-Participation Restrictions Interpersonal Relationship;Cleaning;Laundry;Yard Work;Driving;Meal Prep;Shop    Stability/Clinical Decision Making Evolving/Moderate complexity    Clinical Decision Making Moderate    Rehab Potential Fair    PT Frequency 2x / week    PT Duration 4 weeks    PT Treatment/Interventions ADLs/Self Care Home Management;Aquatic Therapy;Neuromuscular re-education;Balance training;Therapeutic exercise;Therapeutic activities;Functional mobility training;Stair training;Gait training;DME Instruction;Patient/family education;Manual techniques;Energy conservation;Biofeedback;Electrical Stimulation;Moist Heat;Traction;Ultrasound;Dry needling;Passive range of motion;Vestibular;Vasopneumatic  Device;Spinal Manipulations;Joint Manipulations    PT Next Visit Plan Dynamic balance/ gait activities.    Consulted and Agree with Plan of Care Patient             Patient will benefit from skilled therapeutic intervention in order to improve the following deficits and impairments:  Abnormal gait, Decreased endurance, Decreased activity tolerance, Decreased strength, Pain, Difficulty walking, Decreased mobility, Decreased balance, Decreased range of motion, Improper body mechanics, Postural dysfunction, Decreased safety awareness, Decreased coordination, Decreased knowledge of precautions, Decreased knowledge of use of DME, Dizziness, Hypomobility, Impaired flexibility, Impaired sensation, Impaired perceived functional ability  Visit Diagnosis: At high risk for injury related to fall  Bilateral leg weakness  Polio osteopathy of lower leg, left [facility])     Problem List Patient Active Problem List   Diagnosis Date Noted   Aortic atherosclerosis ([place]) 12/29/2020   Deformity of toe of left foot 03/28/2020   H/O multiple pulmonary nodules 09/22/2019   Chronic cough 09/22/2019   Perennial allergic rhinitis 09/17/2019   Acute upper respiratory infection 08/28/2019   Weakness 08/28/2019   Acute bronchitis 06/01/2019   Cough productive of yellow sputum 06/01/2019   Vasomotor rhinitis 05/01/2019   Chronic kidney disease, stage II (mild) 03/14/2019   Gastroesophageal reflux disease without esophagitis 03/14/2019   Intractable episodic headache 03/14/2019   Need for prophylactic vaccination with combined diphtheria-tetanus-pertussis (DTaP) vaccine 03/14/2019   Renal cyst 11/27/2018   Abnormality of gait and mobility 11/27/2018   Hepatic steatosis 10/31/2018   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[place]) 10/31/2018   Elevated ferritin 10/31/2018   Vertigo 08/13/2018   Gastroenteritis 05/09/2018   Diarrhea 05/09/2018   Nausea 05/09/2018   Personal history of colon cancer    Polyp of  sigmoid colon    Dysuria 02/23/2018   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 02/10/2018   Encounter for general adult medical examination with abnormal findings 02/10/2018   Acute non-recurrent pansinusitis 08/24/2017  Other headache syndrome 08/24/2017   Uncontrolled type 2 diabetes mellitus with hyperglycemia ([place]) 08/24/2017   Type 2 diabetes mellitus without complication, with long-term current use of insulin ([place]) 08/22/2017   Need for vaccination against Streptococcus pneumoniae using pneumococcal conjugate vaccine 13 08/22/2017   Essential hypertension 05/03/2017   Otalgia, bilateral 05/02/2017   Type 2 diabetes mellitus with hyperglycemia ([place]) 05/02/2017   Mixed hyperlipidemia 05/02/2017   Right lower quadrant pain 06/29/2016   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   History of colon cancer 04/13/2015   Cancer of ascending colon ([place]) 10/12/2014   Pura Spice, PT, DPT # [place], SPT 05/06/2021, 11:11 AM   [facility] [address]. [place], [place], [address] Phone: [phone]   Fax:  [phone]  Name: Tiffany Hawkins MRN: [id number] Date of Birth: [birth year]

## 2021-05-09 ENCOUNTER — Ambulatory Visit: Payer: Medicare HMO | Admitting: Nurse Practitioner

## 2021-05-10 ENCOUNTER — Other Ambulatory Visit: Payer: Self-pay

## 2021-05-10 ENCOUNTER — Ambulatory Visit: Payer: Medicare HMO | Admitting: Physical Therapy

## 2021-05-10 ENCOUNTER — Encounter: Payer: Self-pay | Admitting: Physical Therapy

## 2021-05-10 DIAGNOSIS — Z9181 History of falling: Secondary | ICD-10-CM | POA: Diagnosis not present

## 2021-05-10 DIAGNOSIS — B91 Sequelae of poliomyelitis: Secondary | ICD-10-CM

## 2021-05-10 DIAGNOSIS — S322XXS Fracture of coccyx, sequela: Secondary | ICD-10-CM | POA: Diagnosis not present

## 2021-05-10 DIAGNOSIS — M545 Low back pain, unspecified: Secondary | ICD-10-CM | POA: Diagnosis not present

## 2021-05-10 DIAGNOSIS — R29898 Other symptoms and signs involving the musculoskeletal system: Secondary | ICD-10-CM | POA: Diagnosis not present

## 2021-05-10 DIAGNOSIS — S3210XS Unspecified fracture of sacrum, sequela: Secondary | ICD-10-CM | POA: Diagnosis not present

## 2021-05-10 DIAGNOSIS — M89662 Osteopathy after poliomyelitis, left lower leg: Secondary | ICD-10-CM | POA: Diagnosis not present

## 2021-05-10 NOTE — Therapy (Addendum)
[facility] [address]. [place], [place], [address] Phone: [phone]   Fax:  [phone]  Physical Therapy Treatment/Recertification Physical Therapy Progress Note   Dates of reporting period  04/05/2021  to 05/10/2021    Patient Details  Name: Tiffany Hawkins MRN: [id number] Date of Birth: Dec 12, 1945 Referring Provider (PT): Girtha Hake MD   Encounter Date: 05/10/2021   PT End of Session - 05/10/21 1354     Visit Number 30    Number of Visits 38    Date for PT Re-Evaluation 06/07/21    Authorization - Visit Number 10    Authorization - Number of Visits 10    Progress Note Due on Visit 10    PT Start Time 1340    PT Stop Time 1435    PT Time Calculation (min) 55 min    Equipment Utilized During Treatment Gait belt   4WW and FWW   Activity Tolerance Patient tolerated treatment well;Patient limited by fatigue   increased unsteadiness after BPPV treatment.   Behavior During Therapy [facility] for tasks assessed/performed             Past Medical History:  Diagnosis Date   Anxiety    Colon cancer ([place]) 10/12/2014   Stage IIB; had chemo   Diabetes mellitus without complication (HCC)    Hyperlipidemia    Hypertension    Lateral epicondylitis    Personal history of chemotherapy    Polio    childhood   Skin cancer    Sqamous Cell, Lt Calf   Sleep apnea    no CPAP, sx improved with wt loss    Past Surgical History:  Procedure Laterality Date   APPENDECTOMY     BACK SURGERY     CATARACT EXTRACTION W/PHACO Left 12/03/2017   Procedure: CATARACT EXTRACTION PHACO AND INTRAOCULAR LENS PLACEMENT ([place]) LEFT  DIABETIC;  Surgeon: Eulogio Bear, MD;  Location: [place];  Service: Ophthalmology;  Laterality: Left;  Diabetic - insulin   CATARACT EXTRACTION W/PHACO Right 01/14/2018   Procedure: CATARACT EXTRACTION PHACO AND INTRAOCULAR LENS PLACEMENT ([facility]) RIGHT DIABETIC;  Surgeon: Eulogio Bear, MD;   Location: [place];  Service: Ophthalmology;  Laterality: Right;  Diabetic - insulin   CERVICAL DISC SURGERY     CHOLECYSTECTOMY     COLON SURGERY     COLONOSCOPY     COLONOSCOPY WITH PROPOFOL N/A 05/17/2015   Procedure: COLONOSCOPY WITH PROPOFOL;  Surgeon: Manya Silvas, MD;  Location: [facility] ENDOSCOPY;  Service: Endoscopy;  Laterality: N/A;   COLONOSCOPY WITH PROPOFOL N/A 04/01/2018   Procedure: COLONOSCOPY WITH BIOPSY;  Surgeon: [name], MD;  Location: [place];  Service: Endoscopy;  Laterality: N/A;  Diabetic - insulin   EMBOLIZATION N/A 01/24/2021   Procedure: EMBOLIZATION;  Surgeon: Algernon Huxley, MD;  Location: [place] CV LAB;  Service: Cardiovascular;  Laterality: N/A;   ESOPHAGOGASTRODUODENOSCOPY     HERNIA REPAIR     LOWER EXTREMITY VENOGRAPHY Left 01/24/2021   Procedure: LOWER EXTREMITY VENOGRAPHY;  Surgeon: Algernon Huxley, MD;  Location: [place] CV LAB;  Service: Cardiovascular;  Laterality: Left;   POLYPECTOMY N/A 04/01/2018   Procedure: POLYPECTOMY;  Surgeon: [name], MD;  Location: [place];  Service: Endoscopy;  Laterality: N/A;   PORT A CATH INJECTION ([place] HX)     Port has been removed   TUBAL LIGATION      There were no vitals filed for this visit.  Subjective Assessment - 05/10/21 1447     Subjective Pt. enters the clinic with no AD. Pt. is aware of the need to use an AD for her safety. Pt. states she feels she has no trust in her L LE when it comes to ambulation and WB.    Pertinent History per referring note: "Patient notes that she does have a history of chronic back pain which did worsen after her fall. She also endorses a history of polio. Over the past month she feels that she has been more off balance and has had more falls. She is also noticing worsening weakness in her legs. She rates her pain as an 8/10. Is intermittent, stiff. She denies any numbness, tingling or loss of control of bowel or bladder. " H/o  Hx cancer, previous back surgery, DM, vertigo, Aortic atherosclerosis ([place]).    Limitations Walking;House hold activities;Sitting;Lifting;Standing    Diagnostic tests MRI of lumbar and cervical spine. See imaging history.    Patient Stated Goals Reduce pain and improve balance.    Currently in Pain? No/denies    Pain Onset More than a month ago            There.ex:      Nustep L4 for 10 min. B UE/LE (no charge)  Seated marches 20x  Reviewed HEP    Neuro Re-Ed:    Walking in //-bars toe/heel taps to 6" step  Tandem balance in //-bars 3x30 secs. Pt. Has extreme difficulty with L LE behind R LE.  SLS in //-bars 2x20 secs. Pt. Needs extra UE assist during L SLS.  Balancing on Airex pad. EO/EC 2x30 secs Pt. Required CGA and worked on minimizing UE assist.    Walking in hallway with CGA and SPC.  Pt. Noticed extreme fatigue and had an ataxic gait. 2 laps  Walking on uneven terrain (grass/curb) to car. Pt. Is cued to increase hip flexion to prevent toe catching during swing phase.      PT Long Term Goals - 05/10/21 1712       PT LONG TERM GOAL #1   Title Pt will improve MMT scores by 1/2 grade to promote functional strength gains for transfers, amb, and decreased caregiver burden.    Baseline 4+/4 Hip flexion  4/4-  Hip abduction  4+/4  Hip adduction  4+/4  Knee extension  4/4 Knee flexion 4/4 Ankle dorsiflexion  4/4 Ankle plantarflexion    Time 4    Period Weeks    Status Partially Met    Target Date 06/07/21      PT LONG TERM GOAL #2   Title Pt will improve BERG score by 5 pts to promote safety with ambulation and functional tasks.    Baseline BERG: 18/56.  11/29: 40/56    Time 4    Period Weeks    Status Partially Met    Target Date 06/07/21      PT LONG TERM GOAL #3   Title Pt will improve FOTO score to predicted improvement value of 56 to measure self reported improvement in functional mobility.    Baseline Initial eval: 36.   11/7: 54 (decrease in back pain  but limitations in safe ambulation).  12/28: 68 (goal met)    Time 4    Period Weeks    Status Achieved    Target Date 04/13/21      PT LONG TERM GOAL #4   Title Pt will demonstrates safety with ambulation using FWW with no LOB and  only occ verbal cueing and SBA with 10 minutes of overground walking.    Baseline Pt amb 1 min with extensive verbal cueing CGA-min A.  1/24: pt. using SPC occasionally and resistant to use of RW    Time 4    Period Weeks    Status Not Met    Target Date 06/07/21      PT LONG TERM GOAL #5   Title Pt will decrease worse pain to 7/10 or less to promote greater access to functional mobility.    Baseline Present 7/10, Best 4/10, Worst 10/10.  11/29: <3/10 back pain    Time 4    Period Weeks    Status Achieved    Target Date 03/15/21                   Plan - 05/10/21 1453     Clinical Impression Statement Tx. was focused on balance and WB in //-bars to help improve confidence in her L LE.  Pt. is severly limited by fatigue throughout the session and often needs to gather her footing upon standing.  Pt. at times has an uncontrolled and antalgic gait with increase step width/ scissoring on the R LE and limited WB on her L LE which causes her to veer to her right when ambulating.  Pt. will continue to benefit from education of assistive device and cueing on ambulation to ensure pt. is able to safely complete her ADLs/ decrease fall risk.    Personal Factors and Comorbidities Comorbidity 3+;Age;Transportation;Fitness    Comorbidities Hx cancer, previous back surgery, DM, vertigo, Aortic atherosclerosis ([place])    Examination-Activity Limitations Stairs;Reach Overhead;Stand;Dressing;Sit;Transfers;Lift;Caring for Others;Carry;Locomotion Level;Squat    Examination-Participation Restrictions Interpersonal Relationship;Cleaning;Laundry;Yard Work;Driving;Meal Prep;Shop    Stability/Clinical Decision Making Evolving/Moderate complexity    Clinical Decision Making  Moderate    Rehab Potential Fair    PT Frequency 2x / week    PT Duration 4 weeks    PT Treatment/Interventions ADLs/Self Care Home Management;Aquatic Therapy;Neuromuscular re-education;Balance training;Therapeutic exercise;Therapeutic activities;Functional mobility training;Stair training;Gait training;DME Instruction;Patient/family education;Manual techniques;Energy conservation;Biofeedback;Electrical Stimulation;Moist Heat;Traction;Ultrasound;Dry needling;Passive range of motion;Vestibular;Vasopneumatic Device;Spinal Manipulations;Joint Manipulations    PT Next Visit Plan Dynamic balance/ gait activities.  UPDATE HEP    Consulted and Agree with Plan of Care Patient             Patient will benefit from skilled therapeutic intervention in order to improve the following deficits and impairments:  Abnormal gait, Decreased endurance, Decreased activity tolerance, Decreased strength, Pain, Difficulty walking, Decreased mobility, Decreased balance, Decreased range of motion, Improper body mechanics, Postural dysfunction, Decreased safety awareness, Decreased coordination, Decreased knowledge of precautions, Decreased knowledge of use of DME, Dizziness, Hypomobility, Impaired flexibility, Impaired sensation, Impaired perceived functional ability  Visit Diagnosis: At high risk for injury related to fall  Bilateral leg weakness  Polio osteopathy of lower leg, left [facility])     Problem List Patient Active Problem List   Diagnosis Date Noted   Aortic atherosclerosis ([place]) 12/29/2020   Deformity of toe of left foot 03/28/2020   H/O multiple pulmonary nodules 09/22/2019   Chronic cough 09/22/2019   Perennial allergic rhinitis 09/17/2019   Acute upper respiratory infection 08/28/2019   Weakness 08/28/2019   Acute bronchitis 06/01/2019   Cough productive of yellow sputum 06/01/2019   Vasomotor rhinitis 05/01/2019   Chronic kidney disease, stage II (mild) 03/14/2019   Gastroesophageal reflux  disease without esophagitis 03/14/2019   Intractable episodic headache 03/14/2019   Need for prophylactic vaccination with combined  diphtheria-tetanus-pertussis (DTaP) vaccine 03/14/2019   Renal cyst 11/27/2018   Abnormality of gait and mobility 11/27/2018   Hepatic steatosis 10/31/2018   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[place]) 10/31/2018   Elevated ferritin 10/31/2018   Vertigo 08/13/2018   Gastroenteritis 05/09/2018   Diarrhea 05/09/2018   Nausea 05/09/2018   Personal history of colon cancer    Polyp of sigmoid colon    Dysuria 02/23/2018   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 02/10/2018   Encounter for general adult medical examination with abnormal findings 02/10/2018   Acute non-recurrent pansinusitis 08/24/2017   Other headache syndrome 08/24/2017   Uncontrolled type 2 diabetes mellitus with hyperglycemia ([place]) 08/24/2017   Type 2 diabetes mellitus without complication, with long-term current use of insulin ([place]) 08/22/2017   Need for vaccination against Streptococcus pneumoniae using pneumococcal conjugate vaccine 13 08/22/2017   Essential hypertension 05/03/2017   Otalgia, bilateral 05/02/2017   Type 2 diabetes mellitus with hyperglycemia ([place]) 05/02/2017   Mixed hyperlipidemia 05/02/2017   Right lower quadrant pain 06/29/2016   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   History of colon cancer 04/13/2015   Cancer of ascending colon ([place]) 10/12/2014   Pura Spice, PT, DPT # [place], SPT 05/10/2021, 5:23 PM  [facility] [address]. [place], [place], [address] Phone: [phone]   Fax:  [phone]  Name: Tiffany Hawkins MRN: [id number] Date of Birth: 04-Jan-1946

## 2021-05-12 ENCOUNTER — Other Ambulatory Visit: Payer: Self-pay

## 2021-05-12 ENCOUNTER — Encounter: Payer: Self-pay | Admitting: Physical Therapy

## 2021-05-12 ENCOUNTER — Ambulatory Visit: Payer: Medicare HMO | Admitting: Physical Therapy

## 2021-05-12 ENCOUNTER — Telehealth: Payer: Self-pay | Admitting: Student-PharmD

## 2021-05-12 DIAGNOSIS — S3210XS Unspecified fracture of sacrum, sequela: Secondary | ICD-10-CM | POA: Diagnosis not present

## 2021-05-12 DIAGNOSIS — M89662 Osteopathy after poliomyelitis, left lower leg: Secondary | ICD-10-CM | POA: Diagnosis not present

## 2021-05-12 DIAGNOSIS — S322XXS Fracture of coccyx, sequela: Secondary | ICD-10-CM | POA: Diagnosis not present

## 2021-05-12 DIAGNOSIS — Z9181 History of falling: Secondary | ICD-10-CM

## 2021-05-12 DIAGNOSIS — M545 Low back pain, unspecified: Secondary | ICD-10-CM | POA: Diagnosis not present

## 2021-05-12 DIAGNOSIS — R29898 Other symptoms and signs involving the musculoskeletal system: Secondary | ICD-10-CM | POA: Diagnosis not present

## 2021-05-12 DIAGNOSIS — B91 Sequelae of poliomyelitis: Secondary | ICD-10-CM | POA: Diagnosis not present

## 2021-05-12 NOTE — Therapy (Signed)
[facility] [address]. [place], [place], [address] Phone: [phone]   Fax:  [phone]  Physical Therapy Treatment  Patient Details  Name: Tiffany Hawkins MRN: [id number] Date of Birth: 08-27-1945 Referring Provider (PT): Girtha Hake MD   Encounter Date: 05/12/2021   Treatment: 31 of 38.  Recert date: 1/32/4401 1343 to 1432    Past Medical History:  Diagnosis Date   Anxiety    Colon cancer ([place]) 10/12/2014   Stage IIB; had chemo   Diabetes mellitus without complication (HCC)    Hyperlipidemia    Hypertension    Lateral epicondylitis    Personal history of chemotherapy    Polio    childhood   Skin cancer    Sqamous Cell, Lt Calf   Sleep apnea    no CPAP, sx improved with wt loss    Past Surgical History:  Procedure Laterality Date   APPENDECTOMY     BACK SURGERY     CATARACT EXTRACTION W/PHACO Left 12/03/2017   Procedure: CATARACT EXTRACTION PHACO AND INTRAOCULAR LENS PLACEMENT ([place]) LEFT  DIABETIC;  Surgeon: Eulogio Bear, MD;  Location: [place];  Service: Ophthalmology;  Laterality: Left;  Diabetic - insulin   CATARACT EXTRACTION W/PHACO Right 01/14/2018   Procedure: CATARACT EXTRACTION PHACO AND INTRAOCULAR LENS PLACEMENT ([facility]) RIGHT DIABETIC;  Surgeon: Eulogio Bear, MD;  Location: [place];  Service: Ophthalmology;  Laterality: Right;  Diabetic - insulin   CERVICAL DISC SURGERY     CHOLECYSTECTOMY     COLON SURGERY     COLONOSCOPY     COLONOSCOPY WITH PROPOFOL N/A 05/17/2015   Procedure: COLONOSCOPY WITH PROPOFOL;  Surgeon: Manya Silvas, MD;  Location: [facility] ENDOSCOPY;  Service: Endoscopy;  Laterality: N/A;   COLONOSCOPY WITH PROPOFOL N/A 04/01/2018   Procedure: COLONOSCOPY WITH BIOPSY;  Surgeon: [name], MD;  Location: [place];  Service: Endoscopy;  Laterality: N/A;  Diabetic - insulin   EMBOLIZATION N/A 01/24/2021   Procedure: EMBOLIZATION;  Surgeon:  Algernon Huxley, MD;  Location: [place] CV [facility];  Service: Cardiovascular;  Laterality: N/A;   ESOPHAGOGASTRODUODENOSCOPY     HERNIA REPAIR     LOWER EXTREMITY VENOGRAPHY Left 01/24/2021   Procedure: LOWER EXTREMITY VENOGRAPHY;  Surgeon: Algernon Huxley, MD;  Location: [place] CV [facility];  Service: Cardiovascular;  Laterality: Left;   POLYPECTOMY N/A 04/01/2018   Procedure: POLYPECTOMY;  Surgeon: [name], MD;  Location: [place];  Service: Endoscopy;  Laterality: N/A;   PORT A CATH INJECTION ([place] [facility]X)     Port has been removed   TUBAL LIGATION      There were no vitals filed for this visit.   Pt. reports she is doing well today. No c/o back pain prior to tx. session. Pt. forgot SPC again today and entered PT wtih no assistive device. No falls. Pt. Went to grocery store yesterday for the 1st time in a long time. Pt. Had to use shopping cart to push around and was really tired afterwards. Pt. Got a golf cart for home use to get around yard and visit family.         Neuro Re-Ed:   Walking in hallway with no assistive device working on a recip. Gait pattern/ arm swing while maintaining upright posture.  Pt. Able to ambulate in center of hallway without any scissoring gait pattern.   Seated alt. UE/LE marching with mirror feedback.    Tandem balance in //-  bars 3x30 secs. Pt. Has extreme difficulty with L LE behind R LE.   SLS in //-bars 2x20 secs. Pt. Needs extra UE assist during L SLS.   Standing toe/heel taps at 6" step (progressing from use of R UE to no UE in //-bars)- cuing to use mirror for posture correction.     Walking in hallway with CGA and SPC.  Pt. Noticed extreme fatigue and had an ataxic gait. 2 laps   Walking on uneven terrain (grass/curb) to car. Pt. Is cued to increase hip flexion to prevent toe catching during swing phase.      There.ex:   (not billed)   Nustep L4 for 10 min. B UE/LE (no charge)   Seated marches 20x  Reviewed standing LE  ex for HEP         PT Long Term Goals - 05/10/21 1712       PT LONG TERM GOAL #1   Title Pt will improve MMT scores by 1/2 grade to promote functional strength gains for transfers, amb, and decreased caregiver burden.    Baseline 4+/4 Hip flexion  4/4-  Hip abduction  4+/4  Hip adduction  4+/4  Knee extension  4/4 Knee flexion 4/4 Ankle dorsiflexion  4/4 Ankle plantarflexion    Time 4    Period Weeks    Status Partially Met    Target Date 06/07/21      PT LONG TERM GOAL #2   Title Pt will improve BERG score by 5 pts to promote safety with ambulation and functional tasks.    Baseline BERG: 18/56.  11/29: 40/56    Time 4    Period Weeks    Status Partially Met    Target Date 06/07/21      PT LONG TERM GOAL #3   Title Pt will improve FOTO score to predicted improvement value of 56 to measure self reported improvement in functional mobility.    Baseline Initial eval: 36.   11/7: 54 (decrease in back pain but limitations in safe ambulation).  12/28: 68 (goal met)    Time 4    Period Weeks    Status Achieved    Target Date 04/13/21      PT LONG TERM GOAL #4   Title Pt will demonstrates safety with ambulation using FWW with no LOB and only occ verbal cueing and SBA with 10 minutes of overground walking.    Baseline Pt amb 1 min with extensive verbal cueing CGA-min A.  1/24: pt. using SPC occasionally and resistant to use of RW    Time 4    Period Weeks    Status Not Met    Target Date 06/07/21      PT LONG TERM GOAL #5   Title Pt will decrease worse pain to 7/10 or less to promote greater access to functional mobility.    Baseline Present 7/10, Best 4/10, Worst 10/10.  11/29: <3/10 back pain    Time 4    Period Weeks    Status Achieved    Target Date 03/15/21                   Plan - 05/14/21 1331     Clinical Impression Statement PT reviewed pts. current HEP and discussed walking program/ use of assistive device to decrease fall risk.  Pt. easily fatigues with  standing therex./ dynamic balance tasks.  No update to HEP at this time and pt. will continue with current exercise routine.  Pt. remains a high fall risk when walking without assistive device and fatigued.  Pt. instructed to sit and rest when fatigued to decrease fall risk.  Pt. will continue to benefit from skilled PT services to improve gait pattern/ decrease fall risk.    Personal Factors and Comorbidities Comorbidity 3+;Age;Transportation;Fitness    Comorbidities Hx cancer, previous back surgery, DM, vertigo, Aortic atherosclerosis ([place])    Examination-Activity Limitations Stairs;Reach Overhead;Stand;Dressing;Sit;Transfers;Lift;Caring for Others;Carry;Locomotion Level;Squat    Examination-Participation Restrictions Interpersonal Relationship;Cleaning;Laundry;Yard Work;Driving;Meal Prep;Shop    Stability/Clinical Decision Making Evolving/Moderate complexity    Clinical Decision Making Moderate    Rehab Potential Fair    PT Frequency 2x / week    PT Duration 4 weeks    PT Treatment/Interventions ADLs/Self Care Home Management;Aquatic Therapy;Neuromuscular re-education;Balance training;Therapeutic exercise;Therapeutic activities;Functional mobility training;Stair training;Gait training;DME Instruction;Patient/family education;Manual techniques;Energy conservation;Biofeedback;Electrical Stimulation;Moist Heat;Traction;Ultrasound;Dry needling;Passive range of motion;Vestibular;Vasopneumatic Device;Spinal Manipulations;Joint Manipulations    PT Next Visit Plan Dynamic balance/ gait activities.    Consulted and Agree with Plan of Care Patient             Patient will benefit from skilled therapeutic intervention in order to improve the following deficits and impairments:  Abnormal gait, Decreased endurance, Decreased activity tolerance, Decreased strength, Pain, Difficulty walking, Decreased mobility, Decreased balance, Decreased range of motion, Improper body mechanics, Postural dysfunction,  Decreased safety awareness, Decreased coordination, Decreased knowledge of precautions, Decreased knowledge of use of DME, Dizziness, Hypomobility, Impaired flexibility, Impaired sensation, Impaired perceived functional ability  Visit Diagnosis: At high risk for injury related to fall  Bilateral leg weakness  Polio osteopathy of lower leg, left (HCC)  Bilateral low back pain, unspecified chronicity, unspecified whether sciatica present  Closed fracture of sacrum and coccyx, sequela     Problem List Patient Active Problem List   Diagnosis Date Noted   Aortic atherosclerosis ([place]) 12/29/2020   Deformity of toe of left foot 03/28/2020   H/O multiple pulmonary nodules 09/22/2019   Chronic cough 09/22/2019   Perennial allergic rhinitis 09/17/2019   Acute upper respiratory infection 08/28/2019   Weakness 08/28/2019   Acute bronchitis 06/01/2019   Cough productive of yellow sputum 06/01/2019   Vasomotor rhinitis 05/01/2019   Chronic kidney disease, stage II (mild) 03/14/2019   Gastroesophageal reflux disease without esophagitis 03/14/2019   Intractable episodic headache 03/14/2019   Need for prophylactic vaccination with combined diphtheria-tetanus-pertussis (DTaP) vaccine 03/14/2019   Renal cyst 11/27/2018   Abnormality of gait and mobility 11/27/2018   Hepatic steatosis 10/31/2018   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[place]) 10/31/2018   Elevated ferritin 10/31/2018   Vertigo 08/13/2018   Gastroenteritis 05/09/2018   Diarrhea 05/09/2018   Nausea 05/09/2018   Personal history of colon cancer    Polyp of sigmoid colon    Dysuria 02/23/2018   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 02/10/2018   Encounter for general adult medical examination with abnormal findings 02/10/2018   Acute non-recurrent pansinusitis 08/24/2017   Other headache syndrome 08/24/2017   Uncontrolled type 2 diabetes mellitus with hyperglycemia ([place]) 08/24/2017   Type 2 diabetes mellitus without  complication, with long-term current use of insulin ([place]) 08/22/2017   Need for vaccination against Streptococcus pneumoniae using pneumococcal conjugate vaccine 13 08/22/2017   Essential hypertension 05/03/2017   Otalgia, bilateral 05/02/2017   Type 2 diabetes mellitus with hyperglycemia ([place]) 05/02/2017   Mixed hyperlipidemia 05/02/2017   Right lower quadrant pain 06/29/2016   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   History of  colon cancer 04/13/2015   Cancer of ascending colon ([place]) 10/12/2014   Pura Spice, PT, DPT # [id number] 05/14/2021, 1:43 PM  [facility] [address]. [place], [place], [address] Phone: [phone]   Fax:  [phone]  Name: Tiffany Hawkins MRN: [id number] Date of Birth: [birth year]

## 2021-05-12 NOTE — Progress Notes (Addendum)
Diabetes (DM) Review Call   JANECE, LAIDLAW [id number] 13 years, Female  DOB: 1946/04/03  M: [phone]  Diabetes (DM) Review (HC) Completed by Charlann Lange on 05/12/2021  Chart Review Is the patient enrolled in RPM with the glucometer?: No  A1C Reading #1 (last): 7.5 on: 04/16/1968  A1C Reading #2: 7.0 on: 02/01/2021  When was patient's last eye exam?: 09/17/2020  The patient's glycemic control has: Worsened (> 0.3 increase)  What recent interventions have been made by any provider to improve the patient's conditions in the last 3 months?:  Office Visits:  05/04/21 Jonetta Osgood, NP. for Type 2 diabetes mellitus. PER NOTE: Pneumococcal 20 vaccine injection given.  Any recent hospitalizations or ED visits since last visit with CPP?: No  Is the patient currently on a STATIN medication?: Yes  Is the patient currently on ACE/ARB medication?: No  Most recent Microalbumin / Creatinine ratio (MACR): 176.8 on: 09/20/2015  Adherence Review Adherence rates for STAR metric medications:  Lovastatin 20 mg 02/27/2021 90 DS Farxiga 10 mg 03/01/20 90 DS  Adherence rates for medications indicated for disease state being reviewed: Farxiga 10 mg 03/01/20 90 DS  Does the patient have >5 day gap between last estimated fill dates for any of the above medications?: No  Disease State Questions Able to connect with the Patient?: Yes  Are you currently checking your blood sugars?: Yes  How often are you checking your blood sugar?: daily  Recent fasting blood sugars: 116, 157, 161, 111, 133, 146, 162, 128, 192  Is the patient having any lows <70?: No  Is the patient having any fasting blood sugars above >170?: No  Is the patient checking their feet daily/regularly?: Yes  Are there any cuts, swelling, blisters, redness, or any signs of infections?: No  When was your last dentist appointment? (6 Month recommendation): 04/2021  What diet changes have you made to improve  your Blood Sugar level?: eating more home-cooked meals, eating more fruits and vegetables  What exercise are you doing to improve your Blood Sugar level?: other  Details: Patient stated she is doing Physical Therapy.   Charlann Lange, [place]  [phone]   Pharmacist Review  Adherence gaps identified?: No Drug Therapy Problems identified?: No Assessment: Controlled  Alena Bills Clinical Pharmacist [phone]

## 2021-05-13 ENCOUNTER — Other Ambulatory Visit: Payer: Self-pay | Admitting: Nurse Practitioner

## 2021-05-13 DIAGNOSIS — K219 Gastro-esophageal reflux disease without esophagitis: Secondary | ICD-10-CM

## 2021-05-15 DIAGNOSIS — E119 Type 2 diabetes mellitus without complications: Secondary | ICD-10-CM | POA: Diagnosis not present

## 2021-05-15 DIAGNOSIS — I10 Essential (primary) hypertension: Secondary | ICD-10-CM | POA: Diagnosis not present

## 2021-05-17 ENCOUNTER — Ambulatory Visit: Payer: Medicare HMO | Admitting: Physical Therapy

## 2021-05-17 ENCOUNTER — Encounter: Payer: Self-pay | Admitting: Physical Therapy

## 2021-05-17 ENCOUNTER — Other Ambulatory Visit: Payer: Self-pay

## 2021-05-17 DIAGNOSIS — M545 Low back pain, unspecified: Secondary | ICD-10-CM | POA: Diagnosis not present

## 2021-05-17 DIAGNOSIS — R29898 Other symptoms and signs involving the musculoskeletal system: Secondary | ICD-10-CM | POA: Diagnosis not present

## 2021-05-17 DIAGNOSIS — S322XXS Fracture of coccyx, sequela: Secondary | ICD-10-CM | POA: Diagnosis not present

## 2021-05-17 DIAGNOSIS — B91 Sequelae of poliomyelitis: Secondary | ICD-10-CM

## 2021-05-17 DIAGNOSIS — M89662 Osteopathy after poliomyelitis, left lower leg: Secondary | ICD-10-CM | POA: Diagnosis not present

## 2021-05-17 DIAGNOSIS — Z9181 History of falling: Secondary | ICD-10-CM | POA: Diagnosis not present

## 2021-05-17 DIAGNOSIS — S3210XS Unspecified fracture of sacrum, sequela: Secondary | ICD-10-CM

## 2021-05-17 NOTE — Therapy (Signed)
[facility] [address] [place], [place], [address] Phone: [phone]   Fax:  [phone]  Physical Therapy Treatment  Patient Details  Name: Tiffany Hawkins MRN: [id number] Date of Birth: 30-Jan-1946 Referring Provider (PT): Girtha Hake MD   Encounter Date: 05/17/2021   PT End of Session - 05/17/21 1357     Visit Number 32    Number of Visits 38    Date for PT Re-Evaluation 06/07/21    Authorization - Visit Number 2    Authorization - Number of Visits 10    Progress Note Due on Visit 10    PT Start Time 4492    PT Stop Time 1432    PT Time Calculation (min) 44 min    Equipment Utilized During Treatment Gait belt   4WW and FWW   Activity Tolerance Patient tolerated treatment well;Patient limited by fatigue   increased unsteadiness after BPPV treatment.   Behavior During Therapy [facility] for tasks assessed/performed             Past Medical History:  Diagnosis Date   Anxiety    Colon cancer ([place]) 10/12/2014   Stage IIB; had chemo   Diabetes mellitus without complication (HCC)    Hyperlipidemia    Hypertension    Lateral epicondylitis    Personal history of chemotherapy    Polio    childhood   Skin cancer    Sqamous Cell, Lt Calf   Sleep apnea    no CPAP, sx improved with wt loss    Past Surgical History:  Procedure Laterality Date   APPENDECTOMY     BACK SURGERY     CATARACT EXTRACTION W/PHACO Left 12/03/2017   Procedure: CATARACT EXTRACTION PHACO AND INTRAOCULAR LENS PLACEMENT ([place]) LEFT  DIABETIC;  Surgeon: Eulogio Bear, MD;  Location: [place];  Service: Ophthalmology;  Laterality: Left;  Diabetic - insulin   CATARACT EXTRACTION W/PHACO Right 01/14/2018   Procedure: CATARACT EXTRACTION PHACO AND INTRAOCULAR LENS PLACEMENT ([facility]) RIGHT DIABETIC;  Surgeon: Eulogio Bear, MD;  Location: [place];  Service: Ophthalmology;  Laterality: Right;  Diabetic - insulin   CERVICAL DISC  SURGERY     CHOLECYSTECTOMY     COLON SURGERY     COLONOSCOPY     COLONOSCOPY WITH PROPOFOL N/A 05/17/2015   Procedure: COLONOSCOPY WITH PROPOFOL;  Surgeon: Manya Silvas, MD;  Location: [facility] ENDOSCOPY;  Service: Endoscopy;  Laterality: N/A;   COLONOSCOPY WITH PROPOFOL N/A 04/01/2018   Procedure: COLONOSCOPY WITH BIOPSY;  Surgeon: [name], MD;  Location: [place];  Service: Endoscopy;  Laterality: N/A;  Diabetic - insulin   EMBOLIZATION N/A 01/24/2021   Procedure: EMBOLIZATION;  Surgeon: Algernon Huxley, MD;  Location: [place] [facility];  Service: Cardiovascular;  Laterality: N/A;   ESOPHAGOGASTRODUODENOSCOPY     HERNIA REPAIR     LOWER EXTREMITY VENOGRAPHY Left 01/24/2021   Procedure: LOWER EXTREMITY VENOGRAPHY;  Surgeon: Algernon Huxley, MD;  Location: [place] [facility];  Service: Cardiovascular;  Laterality: Left;   POLYPECTOMY N/A 04/01/2018   Procedure: POLYPECTOMY;  Surgeon: [name], MD;  Location: [facility];  Service: Endoscopy;  Laterality: N/A;   PORT A CATH INJECTION ([place] [facility])     Port has been removed   TUBAL LIGATION      There were no vitals filed for this visit.   Subjective Assessment - 05/17/21 1353     Subjective Pt. states she has a "  kink in neck" on the L side after waking up this morning.  Pt. reports no back pain and pt. did bring SPC to PT today.  No falls over the weekend.  Pt. states she is trying to walk with an improved/ more consistent BOS.    Pertinent History per referring note: "Patient notes that she does have a history of chronic back pain which did worsen after her fall. She also endorses a history of polio. Over the past month she feels that she has been more off balance and has had more falls. She is also noticing worsening weakness in her legs. She rates her pain as an 8/10. Is intermittent, stiff. She denies any numbness, tingling or loss of control of bowel or bladder. " H/o Hx cancer, previous back surgery, DM, vertigo,  Aortic atherosclerosis ([place]).    Limitations Walking;House hold activities;Sitting;Lifting;Standing    Diagnostic tests MRI of lumbar and cervical spine. See imaging history.    Patient Stated Goals Reduce pain and improve balance.    Currently in Pain? Yes    Pain Score 8     Pain Location Neck    Pain Orientation Left    Pain Onset More than a month ago              Neuro Re-Ed:    Walking in //-bars with no UE assist/ mirror feedback.  Forward/ lateral 3x each.    Walking over hurdles/ Airex pad in //-bars with CGA for safety/ cuing.  Light R UE assist required for safety.  Difficulty maintaining upright posture/ head position.  L LE stance phase limited without UE assist on //-bars.     Walking at agility ladder with focus on a consistent step pattern/ heel strike.  CGA during L LE stance phase of gait.    STS from gray chair with focus on L LE use/ eccentric quad control 5x.     Walking in clinic with CGA and no SPC.  Moderate fatigue with L LE stance phase of gait and several episodes of scissoring noted with self-correction.   Walking out to car with use of SPC and mod. A due to marked muscle fatigue in B LE at end of tx. Session.        There.ex:   (not billed)   Nustep L4 for 10 min. B UE/LE (no charge)           PT Long Term Goals - 05/10/21 1712       PT LONG TERM GOAL #1   Title Pt will improve MMT scores by 1/2 grade to promote functional strength gains for transfers, amb, and decreased caregiver burden.    Baseline 4+/4 Hip flexion  4/4-  Hip abduction  4+/4  Hip adduction  4+/4  Knee extension  4/4 Knee flexion 4/4 Ankle dorsiflexion  4/4 Ankle plantarflexion    Time 4    Period Weeks    Status Partially Met    Target Date 06/07/21      PT LONG TERM GOAL #2   Title Pt will improve BERG score by 5 pts to promote safety with ambulation and functional tasks.    Baseline BERG: 18/56.  11/29: 40/56    Time 4    Period Weeks    Status Partially Met     Target Date 06/07/21      PT LONG TERM GOAL #3   Title Pt will improve FOTO score to predicted improvement value of 56 to measure self reported  improvement in functional mobility.    Baseline Initial eval: 36.   11/7: 54 (decrease in back pain but limitations in safe ambulation).  12/28: 68 (goal met)    Time 4    Period Weeks    Status Achieved    Target Date 04/13/21      PT LONG TERM GOAL #4   Title Pt will demonstrates safety with ambulation using FWW with no LOB and only occ verbal cueing and SBA with 10 minutes of overground walking.    Baseline Pt amb 1 min with extensive verbal cueing CGA-min A.  1/24: pt. using SPC occasionally and resistant to use of RW    Time 4    Period Weeks    Status Not Met    Target Date 06/07/21      PT LONG TERM GOAL #5   Title Pt will decrease worse pain to 7/10 or less to promote greater access to functional mobility.    Baseline Present 7/10, Best 4/10, Worst 10/10.  11/29: <3/10 back pain    Time 4    Period Weeks    Status Achieved    Target Date 03/15/21                Plan - 05/17/21 1357     Clinical Impression Statement L LE wt. bearing remains limiting factor with walking/ balance, esp. during more dynamic tasks.  Pt. requires several short seated rest breaks t/o tx. due to fatigue in LE.  PT continues to recommend use of assistive device with all aspects of walking/ household tasks for safety.  Pt. works hard during tx. session and motivated to increase LE strength/ muscle endurance.  Pt. will continue to benefit from skilled PT services to increase LE strength to improve balance/ decrease fall risk with daily tasks.    Personal Factors and Comorbidities Comorbidity 3+;Age;Transportation;Fitness    Comorbidities Hx cancer, previous back surgery, DM, vertigo, Aortic atherosclerosis ([place])    Examination-Activity Limitations Stairs;Reach Overhead;Stand;Dressing;Sit;Transfers;Lift;Caring for Others;Carry;Locomotion Level;Squat     Examination-Participation Restrictions Interpersonal Relationship;Cleaning;Laundry;Yard Work;Driving;Meal Prep;Shop    Stability/Clinical Decision Making Evolving/Moderate complexity    Clinical Decision Making Moderate    Rehab Potential Fair    PT Frequency 2x / week    PT Duration 4 weeks    PT Treatment/Interventions ADLs/Self Care Home Management;Aquatic Therapy;Neuromuscular re-education;Balance training;Therapeutic exercise;Therapeutic activities;Functional mobility training;Stair training;Gait training;DME Instruction;Patient/family education;Manual techniques;Energy conservation;Biofeedback;Electrical Stimulation;Moist Heat;Traction;Ultrasound;Dry needling;Passive range of motion;Vestibular;Vasopneumatic Device;Spinal Manipulations;Joint Manipulations    PT Next Visit Plan Dynamic balance/ gait activities.    Consulted and Agree with Plan of Care Patient             Patient will benefit from skilled therapeutic intervention in order to improve the following deficits and impairments:  Abnormal gait, Decreased endurance, Decreased activity tolerance, Decreased strength, Pain, Difficulty walking, Decreased mobility, Decreased balance, Decreased range of motion, Improper body mechanics, Postural dysfunction, Decreased safety awareness, Decreased coordination, Decreased knowledge of precautions, Decreased knowledge of use of DME, Dizziness, Hypomobility, Impaired flexibility, Impaired sensation, Impaired perceived functional ability  Visit Diagnosis: At high risk for injury related to fall  Bilateral leg weakness  Polio osteopathy of lower leg, left (HCC)  Bilateral low back pain, unspecified chronicity, unspecified whether sciatica present  Closed fracture of sacrum and coccyx, sequela     Problem List Patient Active Problem List   Diagnosis Date Noted   Aortic atherosclerosis ([place]) 12/29/2020   Deformity of toe of left foot 03/28/2020   H/O multiple pulmonary  nodules  09/22/2019   Chronic cough 09/22/2019   Perennial allergic rhinitis 09/17/2019   Acute upper respiratory infection 08/28/2019   Weakness 08/28/2019   Acute bronchitis 06/01/2019   Cough productive of yellow sputum 06/01/2019   Vasomotor rhinitis 05/01/2019   Chronic kidney disease, stage II (mild) 03/14/2019   Gastroesophageal reflux disease without esophagitis 03/14/2019   Intractable episodic headache 03/14/2019   Need for prophylactic vaccination with combined diphtheria-tetanus-pertussis (DTaP) vaccine 03/14/2019   Renal cyst 11/27/2018   Abnormality of gait and mobility 11/27/2018   Hepatic steatosis 10/31/2018   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[place]) 10/31/2018   Elevated ferritin 10/31/2018   Vertigo 08/13/2018   Gastroenteritis 05/09/2018   Diarrhea 05/09/2018   Nausea 05/09/2018   Personal history of colon cancer    Polyp of sigmoid colon    Dysuria 02/23/2018   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 02/10/2018   Encounter for general adult medical examination with abnormal findings 02/10/2018   Acute non-recurrent pansinusitis 08/24/2017   Other headache syndrome 08/24/2017   Uncontrolled type 2 diabetes mellitus with hyperglycemia ([place]) 08/24/2017   Type 2 diabetes mellitus without complication, with long-term current use of insulin ([place]) 08/22/2017   Need for vaccination against Streptococcus pneumoniae using pneumococcal conjugate vaccine 13 08/22/2017   Essential hypertension 05/03/2017   Otalgia, bilateral 05/02/2017   Type 2 diabetes mellitus with hyperglycemia ([place]) 05/02/2017   Mixed hyperlipidemia 05/02/2017   Right lower quadrant pain 06/29/2016   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   History of colon cancer 04/13/2015   Cancer of ascending colon ([place]) 10/12/2014   Pura Spice, PT, DPT # [phone] 05/17/2021, 2:41 PM  [place] [facility] [address] [place],  [place], [address] Phone: [phone]   Fax:  [phone]  Name: Tiffany Hawkins MRN: [id number] Date of Birth: [birth year]

## 2021-05-19 ENCOUNTER — Other Ambulatory Visit: Payer: Self-pay

## 2021-05-19 ENCOUNTER — Ambulatory Visit: Payer: Medicare HMO | Attending: Physical Medicine & Rehabilitation | Admitting: Physical Therapy

## 2021-05-19 DIAGNOSIS — M89662 Osteopathy after poliomyelitis, left lower leg: Secondary | ICD-10-CM | POA: Insufficient documentation

## 2021-05-19 DIAGNOSIS — R29898 Other symptoms and signs involving the musculoskeletal system: Secondary | ICD-10-CM | POA: Diagnosis not present

## 2021-05-19 DIAGNOSIS — S322XXS Fracture of coccyx, sequela: Secondary | ICD-10-CM | POA: Insufficient documentation

## 2021-05-19 DIAGNOSIS — B91 Sequelae of poliomyelitis: Secondary | ICD-10-CM | POA: Insufficient documentation

## 2021-05-19 DIAGNOSIS — Z9181 History of falling: Secondary | ICD-10-CM | POA: Insufficient documentation

## 2021-05-19 DIAGNOSIS — M545 Low back pain, unspecified: Secondary | ICD-10-CM | POA: Insufficient documentation

## 2021-05-19 DIAGNOSIS — S3210XS Unspecified fracture of sacrum, sequela: Secondary | ICD-10-CM | POA: Insufficient documentation

## 2021-05-22 ENCOUNTER — Encounter: Payer: Self-pay | Admitting: Physical Therapy

## 2021-05-22 NOTE — Therapy (Signed)
[facility] [address]. [place], [place], [address] Phone: [phone]   Fax:  [phone]  Physical Therapy Treatment  Patient Details  Name: Tiffany Hawkins MRN: [id number] Date of Birth: May 27, 1945 Referring Provider (PT): Girtha Hake MD   Encounter Date: 05/19/2021   PT End of Session - 05/22/21 1209     Visit Number 33    Number of Visits 38    Date for PT Re-Evaluation 06/07/21    Authorization - Visit Number 3    Authorization - Number of Visits 10    Progress Note Due on Visit 10    PT Start Time 6761    PT Stop Time 1432    PT Time Calculation (min) 49 min    Equipment Utilized During Treatment Gait belt   4WW and FWW   Activity Tolerance Patient tolerated treatment well;Patient limited by fatigue   increased unsteadiness after BPPV treatment.   Behavior During Therapy [facility] for tasks assessed/performed             Past Medical History:  Diagnosis Date   Anxiety    Colon cancer ([place]) 10/12/2014   Stage IIB; had chemo   Diabetes mellitus without complication (HCC)    Hyperlipidemia    Hypertension    Lateral epicondylitis    Personal history of chemotherapy    Polio    childhood   Skin cancer    Sqamous Cell, Lt Calf   Sleep apnea    no CPAP, sx improved with wt loss    Past Surgical History:  Procedure Laterality Date   APPENDECTOMY     BACK SURGERY     CATARACT EXTRACTION W/PHACO Left 12/03/2017   Procedure: CATARACT EXTRACTION PHACO AND INTRAOCULAR LENS PLACEMENT ([place]) LEFT  DIABETIC;  Surgeon: Eulogio Bear, MD;  Location: [place];  Service: Ophthalmology;  Laterality: Left;  Diabetic - insulin   CATARACT EXTRACTION W/PHACO Right 01/14/2018   Procedure: CATARACT EXTRACTION PHACO AND INTRAOCULAR LENS PLACEMENT ([facility]) RIGHT DIABETIC;  Surgeon: Eulogio Bear, MD;  Location: [place];  Service: Ophthalmology;  Laterality: Right;  Diabetic - insulin   CERVICAL DISC  SURGERY     CHOLECYSTECTOMY     COLON SURGERY     COLONOSCOPY     COLONOSCOPY WITH PROPOFOL N/A 05/17/2015   Procedure: COLONOSCOPY WITH PROPOFOL;  Surgeon: Manya Silvas, MD;  Location: [facility] ENDOSCOPY;  Service: Endoscopy;  Laterality: N/A;   COLONOSCOPY WITH PROPOFOL N/A 04/01/2018   Procedure: COLONOSCOPY WITH BIOPSY;  Surgeon: [name], MD;  Location: [place];  Service: Endoscopy;  Laterality: N/A;  Diabetic - insulin   EMBOLIZATION N/A 01/24/2021   Procedure: EMBOLIZATION;  Surgeon: Algernon Huxley, MD;  Location: [place] CV LAB;  Service: Cardiovascular;  Laterality: N/A;   ESOPHAGOGASTRODUODENOSCOPY     HERNIA REPAIR     LOWER EXTREMITY VENOGRAPHY Left 01/24/2021   Procedure: LOWER EXTREMITY VENOGRAPHY;  Surgeon: Algernon Huxley, MD;  Location: [place] CV LAB;  Service: Cardiovascular;  Laterality: Left;   POLYPECTOMY N/A 04/01/2018   Procedure: POLYPECTOMY;  Surgeon: [name], MD;  Location: [place];  Service: Endoscopy;  Laterality: N/A;   PORT A CATH INJECTION ([place] HX)     Port has been removed   TUBAL LIGATION      There were no vitals filed for this visit.   Subjective Assessment - 05/22/21 1207     Subjective Pt. reports neck pain is  better today.  No falls.  Pt. entered PT with use of SPC.  Pt. reports PT has been helping and she is not ready to stop.    Pertinent History per referring note: "Patient notes that she does have a history of chronic back pain which did worsen after her fall. She also endorses a history of polio. Over the past month she feels that she has been more off balance and has had more falls. She is also noticing worsening weakness in her legs. She rates her pain as an 8/10. Is intermittent, stiff. She denies any numbness, tingling or loss of control of bowel or bladder. " H/o Hx cancer, previous back surgery, DM, vertigo, Aortic atherosclerosis ([place]).    Limitations Walking;House hold  activities;Sitting;Lifting;Standing    Diagnostic tests MRI of lumbar and cervical spine. See imaging history.    Patient Stated Goals Reduce pain and improve balance.    Currently in Pain? No/denies    Pain Onset More than a month ago              Neuro Re-Ed:    Walking in //-bars with no UE assist/ mirror feedback.  Forward/ backwards/ lateral 3x each.  Cuing to increase L hip flexion.    Airex pad: stance/ NBOS/ shoulder flexion/ EC (CGA for safety secondary posterior lean).   STS from gray chair with focus on L LE use/ eccentric quad control 5x.     Walking in clinic with CGA and no SPC.  Moderate fatigue with L LE stance phase of gait and several episodes of scissoring noted with self-correction.   Step touches (toe/heel)- 10x each.  Step ups/ down at stairs with UE assist (limited with L LE step up)- fatigue.    Walking out to car with use of SPC and mod. A due to marked muscle fatigue in B LE at end of tx. Session.        There.ex:     Nustep L3-4 for 10 min. B UE/LE (no charge)  Seated/ standing 2# dumbbell for UE ex.: bicep curls/ sh. Flexion/ punches 20x each.    Standing hip extension/ abduction 20x in //-bars (cuing for posture correction/ technique).         PT Long Term Goals - 05/22/21 1225       PT LONG TERM GOAL #1   Title Pt will improve MMT scores by 1/2 grade to promote functional strength gains for transfers, amb, and decreased caregiver burden.    Baseline 4+/4 Hip flexion  4/4-  Hip abduction  4+/4  Hip adduction  4+/4  Knee extension  4/4 Knee flexion 4/4 Ankle dorsiflexion  4/4 Ankle plantarflexion    Time 4    Period Weeks    Status Partially Met    Target Date 06/07/21      PT LONG TERM GOAL #2   Title Pt will improve BERG score by 5 pts to promote safety with ambulation and functional tasks.    Baseline BERG: 18/56.  11/29: 40/56    Time 4    Period Weeks    Status Partially Met    Target Date 06/07/21      PT LONG TERM GOAL #3    Title Pt will improve FOTO score to predicted improvement value of 56 to measure self reported improvement in functional mobility.    Baseline Initial eval: 36.   11/7: 54 (decrease in back pain but limitations in safe ambulation).  12/28: 68 (goal met)  Time 4    Period Weeks    Status Achieved    Target Date 04/13/21      PT LONG TERM GOAL #4   Title Pt will demonstrates safety with ambulation using FWW with no LOB and only occ verbal cueing and SBA with 10 minutes of overground walking.    Baseline Pt amb 1 min with extensive verbal cueing CGA-min A.  1/24: pt. using SPC occasionally and resistant to use of RW    Time 4    Period Weeks    Status Not Met    Target Date 06/07/21      PT LONG TERM GOAL #5   Title Pt will decrease worse pain to 7/10 or less to promote greater access to functional mobility.    Baseline Present 7/10, Best 4/10, Worst 10/10.  11/29: <3/10 back pain    Time 4    Period Weeks    Status Achieved    Target Date 03/15/21      Additional Long Term Goals   Additional Long Term Goals Yes      PT LONG TERM GOAL #6   Title Pt. will report no falls consistently for 2 weeks with all aspects of walking at home/ in community with LRAD to improve safety/ mobility.    Baseline Pt. using SPC currently and has had a couple recent falls (no injury).  Pt. will benefit from rollator    Time 4    Period Weeks    Status New    Target Date 06/07/21                   Plan - 05/22/21 1216     Clinical Impression Statement Pt. remains a high fall risk with and without use of SPC, esp. when fatigued.  Moderate L LE muscle weakness/ fatigue noted as tx. progresses.  Cuing t/o tx. to increase L hip flexion/ step pattern and focus on L LE stance phase of gait during R swing through.  Pt. challenged with Airex stance and EC standing in //-bars.  Pt. requires CGA t/o tx. for safety esp. when outside of //-bars.  PT added 2# UE ex. to improve standing tolerance and  upright posture.  Pt. will continue to benefit from skilled PT services to increase L LE muscle strength to improve safety with walking/ balance tasks.    Personal Factors and Comorbidities Comorbidity 3+;Age;Transportation;Fitness    Comorbidities Hx cancer, previous back surgery, DM, vertigo, Aortic atherosclerosis ([place])    Examination-Activity Limitations Stairs;Reach Overhead;Stand;Dressing;Sit;Transfers;Lift;Caring for Others;Carry;Locomotion Level;Squat    Examination-Participation Restrictions Interpersonal Relationship;Cleaning;Laundry;Yard Work;Driving;Meal Prep;Shop    Stability/Clinical Decision Making Evolving/Moderate complexity    Clinical Decision Making Moderate    Rehab Potential Fair    PT Frequency 2x / week    PT Duration 4 weeks    PT Treatment/Interventions ADLs/Self Care Home Management;Aquatic Therapy;Neuromuscular re-education;Balance training;Therapeutic exercise;Therapeutic activities;Functional mobility training;Stair training;Gait training;DME Instruction;Patient/family education;Manual techniques;Energy conservation;Biofeedback;Electrical Stimulation;Moist Heat;Traction;Ultrasound;Dry needling;Passive range of motion;Vestibular;Vasopneumatic Device;Spinal Manipulations;Joint Manipulations    PT Next Visit Plan Dynamic balance/ gait activities.    Consulted and Agree with Plan of Care Patient             Patient will benefit from skilled therapeutic intervention in order to improve the following deficits and impairments:  Abnormal gait, Decreased endurance, Decreased activity tolerance, Decreased strength, Pain, Difficulty walking, Decreased mobility, Decreased balance, Decreased range of motion, Improper body mechanics, Postural dysfunction, Decreased safety awareness, Decreased coordination, Decreased knowledge of precautions, Decreased  knowledge of use of DME, Dizziness, Hypomobility, Impaired flexibility, Impaired sensation, Impaired perceived functional  ability  Visit Diagnosis: At high risk for injury related to fall  Bilateral leg weakness  Polio osteopathy of lower leg, left (HCC)  Bilateral low back pain, unspecified chronicity, unspecified whether sciatica present  Closed fracture of sacrum and coccyx, sequela     Problem List Patient Active Problem List   Diagnosis Date Noted   Aortic atherosclerosis ([place]) 12/29/2020   Deformity of toe of left foot 03/28/2020   H/O multiple pulmonary nodules 09/22/2019   Chronic cough 09/22/2019   Perennial allergic rhinitis 09/17/2019   Acute upper respiratory infection 08/28/2019   Weakness 08/28/2019   Acute bronchitis 06/01/2019   Cough productive of yellow sputum 06/01/2019   Vasomotor rhinitis 05/01/2019   Chronic kidney disease, stage II (mild) 03/14/2019   Gastroesophageal reflux disease without esophagitis 03/14/2019   Intractable episodic headache 03/14/2019   Need for prophylactic vaccination with combined diphtheria-tetanus-pertussis (DTaP) vaccine 03/14/2019   Renal cyst 11/27/2018   Abnormality of gait and mobility 11/27/2018   Hepatic steatosis 10/31/2018   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[place]) 10/31/2018   Elevated ferritin 10/31/2018   Vertigo 08/13/2018   Gastroenteritis 05/09/2018   Diarrhea 05/09/2018   Nausea 05/09/2018   Personal history of colon cancer    Polyp of sigmoid colon    Dysuria 02/23/2018   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 02/10/2018   Encounter for general adult medical examination with abnormal findings 02/10/2018   Acute non-recurrent pansinusitis 08/24/2017   Other headache syndrome 08/24/2017   Uncontrolled type 2 diabetes mellitus with hyperglycemia ([place]) 08/24/2017   Type 2 diabetes mellitus without complication, with long-term current use of insulin ([place]) 08/22/2017   Need for vaccination against Streptococcus pneumoniae using pneumococcal conjugate vaccine 13 08/22/2017   Essential hypertension 05/03/2017    Otalgia, bilateral 05/02/2017   Type 2 diabetes mellitus with hyperglycemia ([place]) 05/02/2017   Mixed hyperlipidemia 05/02/2017   Right lower quadrant pain 06/29/2016   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   History of colon cancer 04/13/2015   Cancer of ascending colon ([place]) 10/12/2014   Pura Spice, PT, DPT # [phone] 05/22/2021, 12:29 PM  [facility] [address]. [place], [place], [address] Phone: [phone]   Fax:  [phone]  Name: MEKISHA BITTEL MRN: [id number] Date of Birth: [birth year]

## 2021-05-24 ENCOUNTER — Ambulatory Visit: Payer: Medicare HMO | Admitting: Physical Therapy

## 2021-05-24 ENCOUNTER — Encounter: Payer: Self-pay | Admitting: Physical Therapy

## 2021-05-24 ENCOUNTER — Other Ambulatory Visit: Payer: Self-pay

## 2021-05-24 DIAGNOSIS — R29898 Other symptoms and signs involving the musculoskeletal system: Secondary | ICD-10-CM | POA: Diagnosis not present

## 2021-05-24 DIAGNOSIS — S322XXS Fracture of coccyx, sequela: Secondary | ICD-10-CM | POA: Diagnosis not present

## 2021-05-24 DIAGNOSIS — B91 Sequelae of poliomyelitis: Secondary | ICD-10-CM | POA: Diagnosis not present

## 2021-05-24 DIAGNOSIS — M545 Low back pain, unspecified: Secondary | ICD-10-CM | POA: Diagnosis not present

## 2021-05-24 DIAGNOSIS — S3210XS Unspecified fracture of sacrum, sequela: Secondary | ICD-10-CM

## 2021-05-24 DIAGNOSIS — Z9181 History of falling: Secondary | ICD-10-CM | POA: Diagnosis not present

## 2021-05-24 DIAGNOSIS — M89662 Osteopathy after poliomyelitis, left lower leg: Secondary | ICD-10-CM | POA: Diagnosis not present

## 2021-05-24 NOTE — Therapy (Signed)
[facility] [address]. [place], [place], [address] Phone: [phone]   Fax:  [phone]  Physical Therapy Treatment  Patient Details  Name: Tiffany Hawkins MRN: [id number] Date of Birth: 11-21-1945 Referring Provider (PT): Girtha Hake MD   Encounter Date: 05/24/2021  Treatment: 34 out of 38.  Recert date: 0/81/4481 1341 to 1433   Past Medical History:  Diagnosis Date   Anxiety    Colon cancer ([place]) 10/12/2014   Stage IIB; had chemo   Diabetes mellitus without complication (HCC)    Hyperlipidemia    Hypertension    Lateral epicondylitis    Personal history of chemotherapy    Polio    childhood   Skin cancer    Sqamous Cell, Lt Calf   Sleep apnea    no CPAP, sx improved with wt loss    Past Surgical History:  Procedure Laterality Date   APPENDECTOMY     BACK SURGERY     CATARACT EXTRACTION W/PHACO Left 12/03/2017   Procedure: CATARACT EXTRACTION PHACO AND INTRAOCULAR LENS PLACEMENT ([place]) LEFT  DIABETIC;  Surgeon: Eulogio Bear, MD;  Location: [place];  Service: Ophthalmology;  Laterality: Left;  Diabetic - insulin   CATARACT EXTRACTION W/PHACO Right 01/14/2018   Procedure: CATARACT EXTRACTION PHACO AND INTRAOCULAR LENS PLACEMENT ([facility]) RIGHT DIABETIC;  Surgeon: Eulogio Bear, MD;  Location: [place];  Service: Ophthalmology;  Laterality: Right;  Diabetic - insulin   CERVICAL DISC SURGERY     CHOLECYSTECTOMY     COLON SURGERY     COLONOSCOPY     COLONOSCOPY WITH PROPOFOL N/A 05/17/2015   Procedure: COLONOSCOPY WITH PROPOFOL;  Surgeon: Manya Silvas, MD;  Location: [facility] ENDOSCOPY;  Service: Endoscopy;  Laterality: N/A;   COLONOSCOPY WITH PROPOFOL N/A 04/01/2018   Procedure: COLONOSCOPY WITH BIOPSY;  Surgeon: [name], MD;  Location: [place];  Service: Endoscopy;  Laterality: N/A;  Diabetic - insulin   EMBOLIZATION N/A 01/24/2021   Procedure: EMBOLIZATION;  Surgeon:  Algernon Huxley, MD;  Location: [place] CV LAB;  Service: Cardiovascular;  Laterality: N/A;   ESOPHAGOGASTRODUODENOSCOPY     HERNIA REPAIR     LOWER EXTREMITY VENOGRAPHY Left 01/24/2021   Procedure: LOWER EXTREMITY VENOGRAPHY;  Surgeon: Algernon Huxley, MD;  Location: [place] CV [facility];  Service: Cardiovascular;  Laterality: Left;   POLYPECTOMY N/A 04/01/2018   Procedure: POLYPECTOMY;  Surgeon: [name], MD;  Location: [place];  Service: Endoscopy;  Laterality: N/A;   PORT A CATH INJECTION ([place] HX)     Port has been removed   TUBAL LIGATION      There were no vitals filed for this visit.   Pt. states she had a LOB while walking on gravel next to granddaughter. Pt. did not have SPC and required assist from granddaughter to prevent fall. Pt. still using SPC or any assistive device inconsistently at home and relies on husbands arm when entered church.        Neuro Re-Ed:    Standing 6" step touches with limited UE assist in //-bars 10x each. L/R.  Walking in //-bars with no UE assist/ mirror feedback.  Forward/ backwards/ lateral 3x each.  Cuing to increase L hip flexion.   Airex pad: stance/ NBOS/ shoulder flexion/ step ups and downs 10x each.  CGA for safety/ 2 episodes of posterior lean and LOB requiring UE assist in //-bars to self-correct.  Alt. UE/LE touches in hallway with cuing  to correct posture (2 laps)- extra time/ focus.   Walking on blue mat with no wts or assistive device.  Cuing to increase hip flexion/ step pattern/ heel strike.   Walking at agility ladder with use of SPC and recip. 2-point gait using blue lines for consistent step length.  4x (CGA for safety/ cuing)  STS from gray chair with focus on L LE use/ eccentric quad control 5x.     Walking out to car with use of SPC and mod. A due to marked muscle fatigue in B LE at end of tx. Session.        There.ex:     Nustep L3-4 for 10 min. B UE/LE (no charge)   Seated/ standing 2#  dumbbell for UE ex.: bicep curls/ sh. Flexion/ punches 20x each.     Standing hip extension/ abduction 20x in //-bars (cuing for posture correction/ technique).            PT Long Term Goals - 05/22/21 1225       PT LONG TERM GOAL #1   Title Pt will improve MMT scores by 1/2 grade to promote functional strength gains for transfers, amb, and decreased caregiver burden.    Baseline 4+/4 Hip flexion  4/4-  Hip abduction  4+/4  Hip adduction  4+/4  Knee extension  4/4 Knee flexion 4/4 Ankle dorsiflexion  4/4 Ankle plantarflexion    Time 4    Period Weeks    Status Partially Met    Target Date 06/07/21      PT LONG TERM GOAL #2   Title Pt will improve BERG score by 5 pts to promote safety with ambulation and functional tasks.    Baseline BERG: 18/56.  11/29: 40/56    Time 4    Period Weeks    Status Partially Met    Target Date 06/07/21      PT LONG TERM GOAL #3   Title Pt will improve FOTO score to predicted improvement value of 56 to measure self reported improvement in functional mobility.    Baseline Initial eval: 36.   11/7: 54 (decrease in back pain but limitations in safe ambulation).  12/28: 68 (goal met)    Time 4    Period Weeks    Status Achieved    Target Date 04/13/21      PT LONG TERM GOAL #4   Title Pt will demonstrates safety with ambulation using FWW with no LOB and only occ verbal cueing and SBA with 10 minutes of overground walking.    Baseline Pt amb 1 min with extensive verbal cueing CGA-min A.  1/24: pt. using SPC occasionally and resistant to use of RW    Time 4    Period Weeks    Status Not Met    Target Date 06/07/21      PT LONG TERM GOAL #5   Title Pt will decrease worse pain to 7/10 or less to promote greater access to functional mobility.    Baseline Present 7/10, Best 4/10, Worst 10/10.  11/29: <3/10 back pain    Time 4    Period Weeks    Status Achieved    Target Date 03/15/21      Additional Long Term Goals   Additional Long Term Goals  Yes      PT LONG TERM GOAL #6   Title Pt. will report no falls consistently for 2 weeks with all aspects of walking at home/ in community with LRAD to  improve safety/ mobility.    Baseline Pt. using SPC currently and has had a couple recent falls (no injury).  Pt. will benefit from rollator    Time 4    Period Weeks    Status New    Target Date 06/07/21             Pt. continues to be limited with treatment endurance and L LE muscle strengthening progression.  L LE wt. bearing remains limiting factor with walking/ balance, esp. during more dynamic tasks. Pt. requires several short seated rest breaks t/o tx. due to fatigue in LE. PT, esp. as tx. progresses.  PT recommends use of assistive device for safety with standing/ walking to decrease fall risk.  Pt. works hard during tx. session and motivated to increase LE strength/ muscle endurance. Pt. will continue to benefit from skilled PT services to increase LE strength to improve balance/ decrease fall risk with daily tasks.        Patient will benefit from skilled therapeutic intervention in order to improve the following deficits and impairments:  Abnormal gait, Decreased endurance, Decreased activity tolerance, Decreased strength, Pain, Difficulty walking, Decreased mobility, Decreased balance, Decreased range of motion, Improper body mechanics, Postural dysfunction, Decreased safety awareness, Decreased coordination, Decreased knowledge of precautions, Decreased knowledge of use of DME, Dizziness, Hypomobility, Impaired flexibility, Impaired sensation, Impaired perceived functional ability  Visit Diagnosis: At high risk for injury related to fall  Bilateral leg weakness  Polio osteopathy of lower leg, left (HCC)  Bilateral low back pain, unspecified chronicity, unspecified whether sciatica present  Closed fracture of sacrum and coccyx, sequela     Problem List Patient Active Problem List   Diagnosis Date Noted   Aortic  atherosclerosis ([place]) 12/29/2020   Deformity of toe of left foot 03/28/2020   H/O multiple pulmonary nodules 09/22/2019   Chronic cough 09/22/2019   Perennial allergic rhinitis 09/17/2019   Acute upper respiratory infection 08/28/2019   Weakness 08/28/2019   Acute bronchitis 06/01/2019   Cough productive of yellow sputum 06/01/2019   Vasomotor rhinitis 05/01/2019   Chronic kidney disease, stage II (mild) 03/14/2019   Gastroesophageal reflux disease without esophagitis 03/14/2019   Intractable episodic headache 03/14/2019   Need for prophylactic vaccination with combined diphtheria-tetanus-pertussis (DTaP) vaccine 03/14/2019   Renal cyst 11/27/2018   Abnormality of gait and mobility 11/27/2018   Hepatic steatosis 10/31/2018   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[place]) 10/31/2018   Elevated ferritin 10/31/2018   Vertigo 08/13/2018   Gastroenteritis 05/09/2018   Diarrhea 05/09/2018   Nausea 05/09/2018   Personal history of colon cancer    Polyp of sigmoid colon    Dysuria 02/23/2018   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 02/10/2018   Encounter for general adult medical examination with abnormal findings 02/10/2018   Acute non-recurrent pansinusitis 08/24/2017   Other headache syndrome 08/24/2017   Uncontrolled type 2 diabetes mellitus with hyperglycemia ([place]) 08/24/2017   Type 2 diabetes mellitus without complication, with long-term current use of insulin ([place]) 08/22/2017   Need for vaccination against Streptococcus pneumoniae using pneumococcal conjugate vaccine 13 08/22/2017   Essential hypertension 05/03/2017   Otalgia, bilateral 05/02/2017   Type 2 diabetes mellitus with hyperglycemia ([place]) 05/02/2017   Mixed hyperlipidemia 05/02/2017   Right lower quadrant pain 06/29/2016   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 [place], [place], [address] Phone: [phone]   Fax:  [phone]  Name: SHARETTA RICCHIO MRN: [id number] Date of Birth: 03/09/1946

## 2021-05-26 ENCOUNTER — Other Ambulatory Visit: Payer: Self-pay

## 2021-05-26 ENCOUNTER — Ambulatory Visit: Payer: Medicare HMO | Admitting: Physical Therapy

## 2021-05-26 ENCOUNTER — Encounter: Payer: Self-pay | Admitting: Physical Therapy

## 2021-05-26 DIAGNOSIS — R29898 Other symptoms and signs involving the musculoskeletal system: Secondary | ICD-10-CM | POA: Diagnosis not present

## 2021-05-26 DIAGNOSIS — B91 Sequelae of poliomyelitis: Secondary | ICD-10-CM

## 2021-05-26 DIAGNOSIS — Z9181 History of falling: Secondary | ICD-10-CM | POA: Diagnosis not present

## 2021-05-26 DIAGNOSIS — M89662 Osteopathy after poliomyelitis, left lower leg: Secondary | ICD-10-CM | POA: Diagnosis not present

## 2021-05-26 DIAGNOSIS — M545 Low back pain, unspecified: Secondary | ICD-10-CM | POA: Diagnosis not present

## 2021-05-26 DIAGNOSIS — S322XXS Fracture of coccyx, sequela: Secondary | ICD-10-CM | POA: Diagnosis not present

## 2021-05-26 DIAGNOSIS — S3210XS Unspecified fracture of sacrum, sequela: Secondary | ICD-10-CM | POA: Diagnosis not present

## 2021-05-26 NOTE — Therapy (Addendum)
[facility] [address] [place], [place], [address] Phone: [phone]   Fax:  [phone]  Physical Therapy Treatment  Patient Details  Name: Tiffany Hawkins MRN: [id number] Date of Birth: Aug 27, 1945 Referring Provider (PT): Girtha Hake MD   Encounter Date: 05/26/2021   PT End of Session - 05/26/21 1337     Visit Number 35    Number of Visits 38    Date for PT Re-Evaluation 06/07/21    Authorization - Visit Number 5    Authorization - Number of Visits 10    Progress Note Due on Visit 10    PT Start Time 9826    PT Stop Time 1430    PT Time Calculation (min) 53 min    Equipment Utilized During Treatment Gait belt   4WW and FWW   Activity Tolerance Patient tolerated treatment well;Patient limited by fatigue   increased unsteadiness after BPPV treatment.   Behavior During Therapy [facility] for tasks assessed/performed             Past Medical History:  Diagnosis Date   Anxiety    Colon cancer ([place]) 10/12/2014   Stage IIB; had chemo   Diabetes mellitus without complication (HCC)    Hyperlipidemia    Hypertension    Lateral epicondylitis    Personal history of chemotherapy    Polio    childhood   Skin cancer    Sqamous Cell, Lt Calf   Sleep apnea    no CPAP, sx improved with wt loss    Past Surgical History:  Procedure Laterality Date   APPENDECTOMY     BACK SURGERY     CATARACT EXTRACTION W/PHACO Left 12/03/2017   Procedure: CATARACT EXTRACTION PHACO AND INTRAOCULAR LENS PLACEMENT ([place]) LEFT  DIABETIC;  Surgeon: Eulogio Bear, MD;  Location: [place];  Service: Ophthalmology;  Laterality: Left;  Diabetic - insulin   CATARACT EXTRACTION W/PHACO Right 01/14/2018   Procedure: CATARACT EXTRACTION PHACO AND INTRAOCULAR LENS PLACEMENT ([facility]) RIGHT DIABETIC;  Surgeon: Eulogio Bear, MD;  Location: [place];  Service: Ophthalmology;  Laterality: Right;  Diabetic - insulin   CERVICAL DISC  SURGERY     CHOLECYSTECTOMY     COLON SURGERY     COLONOSCOPY     COLONOSCOPY WITH PROPOFOL N/A 05/17/2015   Procedure: COLONOSCOPY WITH PROPOFOL;  Surgeon: Manya Silvas, MD;  Location: [facility];  Service: Endoscopy;  Laterality: N/A;   COLONOSCOPY WITH PROPOFOL N/A 04/01/2018   Procedure: COLONOSCOPY WITH BIOPSY;  Surgeon: [name], MD;  Location: [place];  Service: Endoscopy;  Laterality: N/A;  Diabetic - insulin   EMBOLIZATION N/A 01/24/2021   Procedure: EMBOLIZATION;  Surgeon: Algernon Huxley, MD;  Location: [place] [facility];  Service: Cardiovascular;  Laterality: N/A;   ESOPHAGOGASTRODUODENOSCOPY     HERNIA REPAIR     LOWER EXTREMITY VENOGRAPHY Left 01/24/2021   Procedure: LOWER EXTREMITY VENOGRAPHY;  Surgeon: Algernon Huxley, MD;  Location: [place] [facility];  Service: Cardiovascular;  Laterality: Left;   POLYPECTOMY N/A 04/01/2018   Procedure: POLYPECTOMY;  Surgeon: [name], MD;  Location: [place];  Service: Endoscopy;  Laterality: N/A;   PORT A CATH INJECTION ([place] [facility])     Port has been removed   TUBAL LIGATION      There were no vitals filed for this visit.   Subjective Assessment - 05/26/21 1423     Subjective Pt. reports no LOB since  last session. Pt. states she has been having bouts of dizziness the past 2 days but rests until it subsides. Pt. has no pain at the start of the session.    Pertinent History per referring note: "Patient notes that she does have a history of chronic back pain which did worsen after her fall. She also endorses a history of polio. Over the past month she feels that she has been more off balance and has had more falls. She is also noticing worsening weakness in her legs. She rates her pain as an 8/10. Is intermittent, stiff. She denies any numbness, tingling or loss of control of bowel or bladder. " H/o Hx cancer, previous back surgery, DM, vertigo, Aortic atherosclerosis ([place]).    Limitations Walking;House  hold activities;Sitting;Lifting;Standing    Diagnostic tests MRI of lumbar and cervical spine. See imaging history.    Patient Stated Goals Reduce pain and improve balance.    Currently in Pain? No/denies    Pain Onset More than a month ago             Neuro Re-Ed:     Standing 6" step touches with limited UE assist in //-bars 10x each. L/R.   Walking in //-bars with no UE assist/ mirror feedback.  Forward/ backwards/ lateral 3x each.  Cuing to increase L hip flexion.   Walking on uneven terrain (grass) around building. Pt. Requires correction from PT after LOB.   STS from gray chair with focus on L LE use/ eccentric quad control 2x5.     Walking out to car with use of SPC and mod. A due to marked muscle fatigue in B LE at end of tx. Session.    Balance in //-bars tandem/NBOS with EC/EO 2x30 seconds    There.ex:     Nustep L3 for 10 min. B UE/LE (no charge)   Seated/ standing 3# dumbbell for UE ex.: bicep curls/ sh. Flexion/ punches 20x each.     Seated marching/ LAQ/ heel raises/ toe raises 2# ankle weights 2x10      PT Long Term Goals - 05/22/21 1225       PT LONG TERM GOAL #1   Title Pt will improve MMT scores by 1/2 grade to promote functional strength gains for transfers, amb, and decreased caregiver burden.    Baseline 4+/4 Hip flexion  4/4-  Hip abduction  4+/4  Hip adduction  4+/4  Knee extension  4/4 Knee flexion 4/4 Ankle dorsiflexion  4/4 Ankle plantarflexion    Time 4    Period Weeks    Status Partially Met    Target Date 06/07/21      PT LONG TERM GOAL #2   Title Pt will improve BERG score by 5 pts to promote safety with ambulation and functional tasks.    Baseline BERG: 18/56.  11/29: 40/56    Time 4    Period Weeks    Status Partially Met    Target Date 06/07/21      PT LONG TERM GOAL #3   Title Pt will improve FOTO score to predicted improvement value of 56 to measure self reported improvement in functional mobility.    Baseline Initial eval:  36.   11/7: 54 (decrease in back pain but limitations in safe ambulation).  12/28: 68 (goal met)    Time 4    Period Weeks    Status Achieved    Target Date 04/13/21      PT LONG TERM GOAL #4  Title Pt will demonstrates safety with ambulation using FWW with no LOB and only occ verbal cueing and SBA with 10 minutes of overground walking.    Baseline Pt amb 1 min with extensive verbal cueing CGA-min A.  1/24: pt. using SPC occasionally and resistant to use of RW    Time 4    Period Weeks    Status Not Met    Target Date 06/07/21      PT LONG TERM GOAL #5   Title Pt will decrease worse pain to 7/10 or less to promote greater access to functional mobility.    Baseline Present 7/10, Best 4/10, Worst 10/10.  11/29: <3/10 back pain    Time 4    Period Weeks    Status Achieved    Target Date 03/15/21      Additional Long Term Goals   Additional Long Term Goals Yes      PT LONG TERM GOAL #6   Title Pt. will report no falls consistently for 2 weeks with all aspects of walking at home/ in community with LRAD to improve safety/ mobility.    Baseline Pt. using SPC currently and has had a couple recent falls (no injury).  Pt. will benefit from rollator    Time 4    Period Weeks    Status New    Target Date 06/07/21                Plan - 05/26/21 1425     Clinical Impression Statement Pt. is severely limited due to overall endurance and L LE muscle fatigue. Pt. continues to benefit from practicing ambulation activities in a safe environment with CGA. Pt. experiences LOB when turning to her left and requires PT corrections with a gait belt. Pt. performs exercises with safe and proper form and the beginning of the session but needs mod A. at the end of the session due to fatigue. Pt. will benefit from ambulation and standing exercises at the beginning of the PT session, then work on standing exercises towards the end to increase LE muscle endurance safely.    Personal Factors and  Comorbidities Comorbidity 3+;Age;Transportation;Fitness    Comorbidities Hx cancer, previous back surgery, DM, vertigo, Aortic atherosclerosis ([place])    Examination-Activity Limitations Stairs;Reach Overhead;Stand;Dressing;Sit;Transfers;Lift;Caring for Others;Carry;Locomotion Level;Squat    Examination-Participation Restrictions Interpersonal Relationship;Cleaning;Laundry;Yard Work;Driving;Meal Prep;Shop    Stability/Clinical Decision Making Evolving/Moderate complexity    Clinical Decision Making Moderate    Rehab Potential Fair    PT Frequency 2x / week    PT Duration 4 weeks    PT Treatment/Interventions ADLs/Self Care Home Management;Aquatic Therapy;Neuromuscular re-education;Balance training;Therapeutic exercise;Therapeutic activities;Functional mobility training;Stair training;Gait training;DME Instruction;Patient/family education;Manual techniques;Energy conservation;Biofeedback;Electrical Stimulation;Moist Heat;Traction;Ultrasound;Dry needling;Passive range of motion;Vestibular;Vasopneumatic Device;Spinal Manipulations;Joint Manipulations    PT Next Visit Plan Dynamic balance/ gait activities.   Outside walking/ stairs.    Consulted and Agree with Plan of Care Patient             Patient will benefit from skilled therapeutic intervention in order to improve the following deficits and impairments:  Abnormal gait, Decreased endurance, Decreased activity tolerance, Decreased strength, Pain, Difficulty walking, Decreased mobility, Decreased balance, Decreased range of motion, Improper body mechanics, Postural dysfunction, Decreased safety awareness, Decreased coordination, Decreased knowledge of precautions, Decreased knowledge of use of DME, Dizziness, Hypomobility, Impaired flexibility, Impaired sensation, Impaired perceived functional ability  Visit Diagnosis: At high risk for injury related to fall  Bilateral leg weakness  Polio osteopathy of lower leg, left (HCC)  Problem  List Patient Active Problem List   Diagnosis Date Noted   Aortic atherosclerosis ([place]) 12/29/2020   Deformity of toe of left foot 03/28/2020   H/O multiple pulmonary nodules 09/22/2019   Chronic cough 09/22/2019   Perennial allergic rhinitis 09/17/2019   Acute upper respiratory infection 08/28/2019   Weakness 08/28/2019   Acute bronchitis 06/01/2019   Cough productive of yellow sputum 06/01/2019   Vasomotor rhinitis 05/01/2019   Chronic kidney disease, stage II (mild) 03/14/2019   Gastroesophageal reflux disease without esophagitis 03/14/2019   Intractable episodic headache 03/14/2019   Need for prophylactic vaccination with combined diphtheria-tetanus-pertussis (DTaP) vaccine 03/14/2019   Renal cyst 11/27/2018   Abnormality of gait and mobility 11/27/2018   Hepatic steatosis 10/31/2018   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[place]) 10/31/2018   Elevated ferritin 10/31/2018   Vertigo 08/13/2018   Gastroenteritis 05/09/2018   Diarrhea 05/09/2018   Nausea 05/09/2018   Personal history of colon cancer    Polyp of sigmoid colon    Dysuria 02/23/2018   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 02/10/2018   Encounter for general adult medical examination with abnormal findings 02/10/2018   Acute non-recurrent pansinusitis 08/24/2017   Other headache syndrome 08/24/2017   Uncontrolled type 2 diabetes mellitus with hyperglycemia ([place]) 08/24/2017   Type 2 diabetes mellitus without complication, with long-term current use of insulin ([place]) 08/22/2017   Need for vaccination against Streptococcus pneumoniae using pneumococcal conjugate vaccine 13 08/22/2017   Essential hypertension 05/03/2017   Otalgia, bilateral 05/02/2017   Type 2 diabetes mellitus with hyperglycemia ([place]) 05/02/2017   Mixed hyperlipidemia 05/02/2017   Right lower quadrant pain 06/29/2016   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 [place], [place], [address] Phone: [phone]   Fax:  [phone]  Name: Tiffany Hawkins MRN: [id number] Date of Birth: [birth year]

## 2021-05-31 ENCOUNTER — Ambulatory Visit: Payer: Medicare HMO | Admitting: Physical Therapy

## 2021-05-31 ENCOUNTER — Other Ambulatory Visit: Payer: Self-pay

## 2021-05-31 ENCOUNTER — Encounter: Payer: Self-pay | Admitting: Physical Therapy

## 2021-05-31 ENCOUNTER — Encounter: Payer: Self-pay | Admitting: Nurse Practitioner

## 2021-05-31 DIAGNOSIS — S3210XS Unspecified fracture of sacrum, sequela: Secondary | ICD-10-CM

## 2021-05-31 DIAGNOSIS — M545 Low back pain, unspecified: Secondary | ICD-10-CM

## 2021-05-31 DIAGNOSIS — S322XXS Fracture of coccyx, sequela: Secondary | ICD-10-CM | POA: Diagnosis not present

## 2021-05-31 DIAGNOSIS — Z9181 History of falling: Secondary | ICD-10-CM

## 2021-05-31 DIAGNOSIS — R29898 Other symptoms and signs involving the musculoskeletal system: Secondary | ICD-10-CM | POA: Diagnosis not present

## 2021-05-31 DIAGNOSIS — B91 Sequelae of poliomyelitis: Secondary | ICD-10-CM

## 2021-05-31 DIAGNOSIS — M89662 Osteopathy after poliomyelitis, left lower leg: Secondary | ICD-10-CM | POA: Diagnosis not present

## 2021-05-31 NOTE — Therapy (Addendum)
[facility] [address]. [place], [place], [address] Phone: [phone]   Fax:  [phone]  Physical Therapy Treatment  Patient Details  Name: Tiffany Hawkins MRN: [id number] Date of Birth: 12/26/1945 Referring Provider (PT): Girtha Hake MD   Encounter Date: 05/31/2021   PT End of Session - 05/31/21 1804     Visit Number 36    Number of Visits 38    Date for PT Re-Evaluation 06/07/21    Authorization - Visit Number 6    Authorization - Number of Visits 10    Progress Note Due on Visit 10    PT Start Time 3532    PT Stop Time 1605    PT Time Calculation (min) 50 min    Equipment Utilized During Treatment Gait belt   4WW and FWW   Activity Tolerance Patient tolerated treatment well;Patient limited by fatigue   increased unsteadiness after BPPV treatment.   Behavior During Therapy [facility] for tasks assessed/performed             Past Medical History:  Diagnosis Date   Anxiety    Colon cancer ([place]) 10/12/2014   Stage IIB; had chemo   Diabetes mellitus without complication (HCC)    Hyperlipidemia    Hypertension    Lateral epicondylitis    Personal history of chemotherapy    Polio    childhood   Skin cancer    Sqamous Cell, Lt Calf   Sleep apnea    no CPAP, sx improved with wt loss    Past Surgical History:  Procedure Laterality Date   APPENDECTOMY     BACK SURGERY     CATARACT EXTRACTION W/PHACO Left 12/03/2017   Procedure: CATARACT EXTRACTION PHACO AND INTRAOCULAR LENS PLACEMENT ([place]) LEFT  DIABETIC;  Surgeon: Eulogio Bear, MD;  Location: [place];  Service: Ophthalmology;  Laterality: Left;  Diabetic - insulin   CATARACT EXTRACTION W/PHACO Right 01/14/2018   Procedure: CATARACT EXTRACTION PHACO AND INTRAOCULAR LENS PLACEMENT ([facility]) RIGHT DIABETIC;  Surgeon: Eulogio Bear, MD;  Location: [place];  Service: Ophthalmology;  Laterality: Right;  Diabetic - insulin   CERVICAL DISC  SURGERY     CHOLECYSTECTOMY     COLON SURGERY     COLONOSCOPY     COLONOSCOPY WITH PROPOFOL N/A 05/17/2015   Procedure: COLONOSCOPY WITH PROPOFOL;  Surgeon: Manya Silvas, MD;  Location: [facility] ENDOSCOPY;  Service: Endoscopy;  Laterality: N/A;   COLONOSCOPY WITH PROPOFOL N/A 04/01/2018   Procedure: COLONOSCOPY WITH BIOPSY;  Surgeon: [name], MD;  Location: [place];  Service: Endoscopy;  Laterality: N/A;  Diabetic - insulin   EMBOLIZATION N/A 01/24/2021   Procedure: EMBOLIZATION;  Surgeon: Algernon Huxley, MD;  Location: [place] [facility];  Service: Cardiovascular;  Laterality: N/A;   ESOPHAGOGASTRODUODENOSCOPY     HERNIA REPAIR     LOWER EXTREMITY VENOGRAPHY Left 01/24/2021   Procedure: LOWER EXTREMITY VENOGRAPHY;  Surgeon: Algernon Huxley, MD;  Location: [place] [facility];  Service: Cardiovascular;  Laterality: Left;   POLYPECTOMY N/A 04/01/2018   Procedure: POLYPECTOMY;  Surgeon: [name], MD;  Location: [place];  Service: Endoscopy;  Laterality: N/A;   PORT A CATH INJECTION ([place] [facility])     Port has been removed   TUBAL LIGATION      There were no vitals filed for this visit.   Subjective Assessment - 05/31/21 1808     Subjective Pt. has no new bouts  of dizziness or LOB. Pt. went to the grocery store by herself and had no safety issues. Pt. went for a walk this morning and is feeling tired but has no pain at start of session.    Pertinent History per referring note: "Patient notes that she does have a history of chronic back pain which did worsen after her fall. She also endorses a history of polio. Over the past month she feels that she has been more off balance and has had more falls. She is also noticing worsening weakness in her legs. She rates her pain as an 8/10. Is intermittent, stiff. She denies any numbness, tingling or loss of control of bowel or bladder. " H/o Hx cancer, previous back surgery, DM, vertigo, Aortic atherosclerosis ([place]).     Limitations Walking;House hold activities;Sitting;Lifting;Standing    Diagnostic tests MRI of lumbar and cervical spine. See imaging history.    Patient Stated Goals Reduce pain and improve balance.    Currently in Pain? No/denies    Pain Onset More than a month ago             Neuro Re-Ed:     Walking in //-bars with no UE assist/ mirror feedback.  Forward/ backwards/ lateral 3x each.  Cuing to increase L hip flexion.    Walking on uneven terrain (grass) around building. Pt. Requires correction from PT after LOB.  Walking and picking up cones outside with [facility] and CGA. Pt. Has LOB corrected by PT.   Walking figure 8 cone pattern on grass. Pt. Toe catches on grass as fatigue increases.   Ascending/descneding curb. Pt. Ascends with R LE and descends with L LE, noting increase in pain.   Walking out to car with use of SPC and mod. A due to marked muscle fatigue in B LE at end of tx. Session.    Balance in //-bars tandem/NBOS with EC/EO 2x30 seconds     There.ex:     Nustep L1 for 8 min. B UE/LE (no charge)   Seated/ standing 3# dumbbell for UE ex.: bicep curls/ sh. Abd with elbow flexed. 20x each.         PT Long Term Goals - 05/22/21 1225       PT LONG TERM GOAL #1   Title Pt will improve MMT scores by 1/2 grade to promote functional strength gains for transfers, amb, and decreased caregiver burden.    Baseline 4+/4 Hip flexion  4/4-  Hip abduction  4+/4  Hip adduction  4+/4  Knee extension  4/4 Knee flexion 4/4 Ankle dorsiflexion  4/4 Ankle plantarflexion    Time 4    Period Weeks    Status Partially Met    Target Date 06/07/21      PT LONG TERM GOAL #2   Title Pt will improve BERG score by 5 pts to promote safety with ambulation and functional tasks.    Baseline BERG: 18/56.  11/29: 40/56    Time 4    Period Weeks    Status Partially Met    Target Date 06/07/21      PT LONG TERM GOAL #3   Title Pt will improve FOTO score to predicted improvement value of 56  to measure self reported improvement in functional mobility.    Baseline Initial eval: 36.   11/7: 54 (decrease in back pain but limitations in safe ambulation).  12/28: 68 (goal met)    Time 4    Period Weeks    Status Achieved  Target Date 04/13/21      PT LONG TERM GOAL #4   Title Pt will demonstrates safety with ambulation using FWW with no LOB and only occ verbal cueing and SBA with 10 minutes of overground walking.    Baseline Pt amb 1 min with extensive verbal cueing CGA-min A.  1/24: pt. using SPC occasionally and resistant to use of RW    Time 4    Period Weeks    Status Not Met    Target Date 06/07/21      PT LONG TERM GOAL #5   Title Pt will decrease worse pain to 7/10 or less to promote greater access to functional mobility.    Baseline Present 7/10, Best 4/10, Worst 10/10.  11/29: <3/10 back pain    Time 4    Period Weeks    Status Achieved    Target Date 03/15/21      Additional Long Term Goals   Additional Long Term Goals Yes      PT LONG TERM GOAL #6   Title Pt. will report no falls consistently for 2 weeks with all aspects of walking at home/ in community with LRAD to improve safety/ mobility.    Baseline Pt. using SPC currently and has had a couple recent falls (no injury).  Pt. will benefit from rollator    Time 4    Period Weeks    Status New    Target Date 06/07/21                Plan - 05/31/21 1553     Clinical Impression Statement Pt. can perform all activities at the beginning of the tx. but the further into the session the more she is limited by overall muscle fatigue. Pt. states she went for a walk this morning without a SPC and had to take multiple breaks to make it back.  Pt. continues to benefit from practicing ambulation activities in a safe environment with CGA as she regularly experiences LOB during tx. And household ADLs. Pt. responds well to practicing community ambulation on outside terrain with [facility]. Pt. is able ambulate with minor LOB  throughout session and notes feeling extremely tired at the end of the session. Pt. needs to take a break after multiple LOB post NuStep. Pt. will continue to progress with gait training and therex. To make her functional ADLs easier to perform.    Personal Factors and Comorbidities Comorbidity 3+;Age;Transportation;Fitness    Comorbidities Hx cancer, previous back surgery, DM, vertigo, Aortic atherosclerosis ([place])    Examination-Activity Limitations Stairs;Reach Overhead;Stand;Dressing;Sit;Transfers;Lift;Caring for Others;Carry;Locomotion Level;Squat    Examination-Participation Restrictions Interpersonal Relationship;Cleaning;Laundry;Yard Work;Driving;Meal Prep;Shop    Stability/Clinical Decision Making Evolving/Moderate complexity    Clinical Decision Making Moderate    Rehab Potential Fair    PT Frequency 2x / week    PT Duration 4 weeks    PT Treatment/Interventions ADLs/Self Care Home Management;Aquatic Therapy;Neuromuscular re-education;Balance training;Therapeutic exercise;Therapeutic activities;Functional mobility training;Stair training;Gait training;DME Instruction;Patient/family education;Manual techniques;Energy conservation;Biofeedback;Electrical Stimulation;Moist Heat;Traction;Ultrasound;Dry needling;Passive range of motion;Vestibular;Vasopneumatic Device;Spinal Manipulations;Joint Manipulations    PT Next Visit Plan Dynamic balance/ gait activities.   Outside walking/ stairs.    Consulted and Agree with Plan of Care Patient             Patient will benefit from skilled therapeutic intervention in order to improve the following deficits and impairments:  Abnormal gait, Decreased endurance, Decreased activity tolerance, Decreased strength, Pain, Difficulty walking, Decreased mobility, Decreased balance, Decreased range of motion, Improper body mechanics, Postural  dysfunction, Decreased safety awareness, Decreased coordination, Decreased knowledge of precautions, Decreased knowledge  of use of DME, Dizziness, Hypomobility, Impaired flexibility, Impaired sensation, Impaired perceived functional ability  Visit Diagnosis: At high risk for injury related to fall  Bilateral leg weakness  Polio osteopathy of lower leg, left (HCC)  Bilateral low back pain, unspecified chronicity, unspecified whether sciatica present  Closed fracture of sacrum and coccyx, sequela     Problem List Patient Active Problem List   Diagnosis Date Noted   Aortic atherosclerosis ([place]) 12/29/2020   Deformity of toe of left foot 03/28/2020   H/O multiple pulmonary nodules 09/22/2019   Chronic cough 09/22/2019   Perennial allergic rhinitis 09/17/2019   Acute upper respiratory infection 08/28/2019   Weakness 08/28/2019   Acute bronchitis 06/01/2019   Cough productive of yellow sputum 06/01/2019   Vasomotor rhinitis 05/01/2019   Chronic kidney disease, stage II (mild) 03/14/2019   Gastroesophageal reflux disease without esophagitis 03/14/2019   Intractable episodic headache 03/14/2019   Need for prophylactic vaccination with combined diphtheria-tetanus-pertussis (DTaP) vaccine 03/14/2019   Renal cyst 11/27/2018   Abnormality of gait and mobility 11/27/2018   Hepatic steatosis 10/31/2018   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[place]) 10/31/2018   Elevated ferritin 10/31/2018   Vertigo 08/13/2018   Gastroenteritis 05/09/2018   Diarrhea 05/09/2018   Nausea 05/09/2018   Personal history of colon cancer    Polyp of sigmoid colon    Dysuria 02/23/2018   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 02/10/2018   Encounter for general adult medical examination with abnormal findings 02/10/2018   Acute non-recurrent pansinusitis 08/24/2017   Other headache syndrome 08/24/2017   Uncontrolled type 2 diabetes mellitus with hyperglycemia ([place]) 08/24/2017   Type 2 diabetes mellitus without complication, with long-term current use of insulin ([place]) 08/22/2017   Need for vaccination against  Streptococcus pneumoniae using pneumococcal conjugate vaccine 13 08/22/2017   Essential hypertension 05/03/2017   Otalgia, bilateral 05/02/2017   Type 2 diabetes mellitus with hyperglycemia ([place]) 05/02/2017   Mixed hyperlipidemia 05/02/2017   Right lower quadrant pain 06/29/2016   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   History of colon cancer 04/13/2015   Cancer of ascending colon ([place]) 10/12/2014   Pura Spice, PT, DPT # [place], SPT 05/31/2021, 6:29 PM  [facility] [address] [place], [place], [address] Phone: [phone]   Fax:  [phone]  Name: Tiffany Hawkins MRN: [id number] Date of Birth: 08-21-1945

## 2021-06-02 ENCOUNTER — Encounter: Payer: Self-pay | Admitting: Physical Therapy

## 2021-06-02 ENCOUNTER — Ambulatory Visit: Payer: Medicare HMO | Admitting: Physical Therapy

## 2021-06-02 ENCOUNTER — Other Ambulatory Visit: Payer: Self-pay

## 2021-06-02 DIAGNOSIS — S3210XS Unspecified fracture of sacrum, sequela: Secondary | ICD-10-CM | POA: Diagnosis not present

## 2021-06-02 DIAGNOSIS — M89662 Osteopathy after poliomyelitis, left lower leg: Secondary | ICD-10-CM | POA: Diagnosis not present

## 2021-06-02 DIAGNOSIS — R29898 Other symptoms and signs involving the musculoskeletal system: Secondary | ICD-10-CM | POA: Diagnosis not present

## 2021-06-02 DIAGNOSIS — Z9181 History of falling: Secondary | ICD-10-CM

## 2021-06-02 DIAGNOSIS — M545 Low back pain, unspecified: Secondary | ICD-10-CM | POA: Diagnosis not present

## 2021-06-02 DIAGNOSIS — S322XXS Fracture of coccyx, sequela: Secondary | ICD-10-CM

## 2021-06-02 DIAGNOSIS — B91 Sequelae of poliomyelitis: Secondary | ICD-10-CM

## 2021-06-02 NOTE — Therapy (Signed)
[facility] [address]. [place], [place], [address] Phone: [phone]   Fax:  [phone]  Physical Therapy Treatment  Patient Details  Name: Tiffany Hawkins MRN: [id number] Date of Birth: 12/27/1945 Referring Provider (PT): Girtha Hake MD   Encounter Date: 06/02/2021   PT End of Session - 06/02/21 1356     Visit Number 37    Number of Visits 38    Date for PT Re-Evaluation 06/07/21    Authorization - Visit Number 7    Authorization - Number of Visits 10    Progress Note Due on Visit 10    PT Start Time 1344    Equipment Utilized During Treatment Gait belt   4WW and FWW   Activity Tolerance Patient tolerated treatment well;Patient limited by fatigue   increased unsteadiness after BPPV treatment.   Behavior During Therapy [facility] for tasks assessed/performed             Past Medical History:  Diagnosis Date   Anxiety    Colon cancer ([place]) 10/12/2014   Stage IIB; had chemo   Diabetes mellitus without complication (HCC)    Hyperlipidemia    Hypertension    Lateral epicondylitis    Personal history of chemotherapy    Polio    childhood   Skin cancer    Sqamous Cell, Lt Calf   Sleep apnea    no CPAP, sx improved with wt loss    Past Surgical History:  Procedure Laterality Date   APPENDECTOMY     BACK SURGERY     CATARACT EXTRACTION W/PHACO Left 12/03/2017   Procedure: CATARACT EXTRACTION PHACO AND INTRAOCULAR LENS PLACEMENT ([place]) LEFT  DIABETIC;  Surgeon: Eulogio Bear, MD;  Location: [place];  Service: Ophthalmology;  Laterality: Left;  Diabetic - insulin   CATARACT EXTRACTION W/PHACO Right 01/14/2018   Procedure: CATARACT EXTRACTION PHACO AND INTRAOCULAR LENS PLACEMENT ([facility]) RIGHT DIABETIC;  Surgeon: Eulogio Bear, MD;  Location: [place];  Service: Ophthalmology;  Laterality: Right;  Diabetic - insulin   CERVICAL DISC SURGERY     CHOLECYSTECTOMY     COLON SURGERY      COLONOSCOPY     COLONOSCOPY WITH PROPOFOL N/A 05/17/2015   Procedure: COLONOSCOPY WITH PROPOFOL;  Surgeon: Manya Silvas, MD;  Location: [facility] ENDOSCOPY;  Service: Endoscopy;  Laterality: N/A;   COLONOSCOPY WITH PROPOFOL N/A 04/01/2018   Procedure: COLONOSCOPY WITH BIOPSY;  Surgeon: [name], MD;  Location: [place];  Service: Endoscopy;  Laterality: N/A;  Diabetic - insulin   EMBOLIZATION N/A 01/24/2021   Procedure: EMBOLIZATION;  Surgeon: Algernon Huxley, MD;  Location: [place] CV LAB;  Service: Cardiovascular;  Laterality: N/A;   ESOPHAGOGASTRODUODENOSCOPY     HERNIA REPAIR     LOWER EXTREMITY VENOGRAPHY Left 01/24/2021   Procedure: LOWER EXTREMITY VENOGRAPHY;  Surgeon: Algernon Huxley, MD;  Location: [place] CV [facility];  Service: Cardiovascular;  Laterality: Left;   POLYPECTOMY N/A 04/01/2018   Procedure: POLYPECTOMY;  Surgeon: [name], MD;  Location: [place];  Service: Endoscopy;  Laterality: N/A;   PORT A CATH INJECTION ([place] [facility])     Port has been removed   TUBAL LIGATION      There were no vitals filed for this visit.   Subjective Assessment - 06/02/21 1356     Subjective Pt. reports going a walk this morning with no LOB or issues reported.  Pt. entered PT without assistive device and  consistent gait pattern.  No c/o pain.    Pertinent History per referring note: "Patient notes that she does have a history of chronic back pain which did worsen after her fall. She also endorses a history of polio. Over the past month she feels that she has been more off balance and has had more falls. She is also noticing worsening weakness in her legs. She rates her pain as an 8/10. Is intermittent, stiff. She denies any numbness, tingling or loss of control of bowel or bladder. " H/o Hx cancer, previous back surgery, DM, vertigo, Aortic atherosclerosis ([place]).    Limitations Walking;House hold activities;Sitting;Lifting;Standing    Diagnostic tests MRI of lumbar and  cervical spine. See imaging history.    Patient Stated Goals Reduce pain and improve balance.    Currently in Pain? No/denies    Pain Onset More than a month ago             Neuro Re-Ed:     Walking in gym with no UE assist (1 lap).  Walking in //-bars: Forward/ backwards/ lateral 3x each.  Cuing to increase L hip flexion/ step length/ correct posture.    Walking on uneven terrain (grass) back of building.  Pt. Requires CGA/min. A from PT with LOB.   Walking and picking up cones outside with [facility] and CGA.  No LOB today.    Walking in parking lot with 5# unilateral bag carry.  Ascending/ descending outside stairs with step to pattern (top flight of stairs only).  Pt. Ascending with L LE and descending with R LE.     Ascending/descneding curb. Pt. Ascends with R LE and descends with L LE, noting increase in pain.  Inside stairs ascending with R LE/ descending with L.    Standing 6" toe taps (alternating)- No UE assist.    Walking out to car with use of SPC and mod. A due to marked muscle fatigue in B LE at end of tx. Session.        There.ex:     Nustep L1 for 8 min. B UE/LE (no charge)   Seated B sh. Scaption/ abduction with 1# dumbbells.  Seated 2# bicep curls 10x each.            PT Long Term Goals - 05/22/21 1225       PT LONG TERM GOAL #1   Title Pt will improve MMT scores by 1/2 grade to promote functional strength gains for transfers, amb, and decreased caregiver burden.    Baseline 4+/4 Hip flexion  4/4-  Hip abduction  4+/4  Hip adduction  4+/4  Knee extension  4/4 Knee flexion 4/4 Ankle dorsiflexion  4/4 Ankle plantarflexion    Time 4    Period Weeks    Status Partially Met    Target Date 06/07/21      PT LONG TERM GOAL #2   Title Pt will improve BERG score by 5 pts to promote safety with ambulation and functional tasks.    Baseline BERG: 18/56.  11/29: 40/56    Time 4    Period Weeks    Status Partially Met    Target Date 06/07/21      PT LONG TERM  GOAL #3   Title Pt will improve FOTO score to predicted improvement value of 56 to measure self reported improvement in functional mobility.    Baseline Initial eval: 36.   11/7: 54 (decrease in back pain but limitations in safe ambulation).  12/28:  68 (goal met)    Time 4    Period Weeks    Status Achieved    Target Date 04/13/21      PT LONG TERM GOAL #4   Title Pt will demonstrates safety with ambulation using FWW with no LOB and only occ verbal cueing and SBA with 10 minutes of overground walking.    Baseline Pt amb 1 min with extensive verbal cueing CGA-min A.  1/24: pt. using SPC occasionally and resistant to use of RW    Time 4    Period Weeks    Status Not Met    Target Date 06/07/21      PT LONG TERM GOAL #5   Title Pt will decrease worse pain to 7/10 or less to promote greater access to functional mobility.    Baseline Present 7/10, Best 4/10, Worst 10/10.  11/29: <3/10 back pain    Time 4    Period Weeks    Status Achieved    Target Date 03/15/21      Additional Long Term Goals   Additional Long Term Goals Yes      PT LONG TERM GOAL #6   Title Pt. will report no falls consistently for 2 weeks with all aspects of walking at home/ in community with LRAD to improve safety/ mobility.    Baseline Pt. using SPC currently and has had a couple recent falls (no injury).  Pt. will benefit from rollator    Time 4    Period Weeks    Status New    Target Date 06/07/21                Plan - 06/02/21 1432     Clinical Impression Statement Short stridelength with R LE during L LE stance phase of gait due to L LE muscle weakness/ fatigue.  Moderate LE muscle fatigue progressively worsens during tx. session/ dynamic balance tasks.  CGA/min. A during all outside dynamic and walking activities.  Pt. will continue to benefit from assistive device with all aspects of walking, esp. outside.  No change to HEP.    Personal Factors and Comorbidities Comorbidity  3+;Age;Transportation;Fitness    Comorbidities Hx cancer, previous back surgery, DM, vertigo, Aortic atherosclerosis ([place])    Examination-Activity Limitations Stairs;Reach Overhead;Stand;Dressing;Sit;Transfers;Lift;Caring for Others;Carry;Locomotion Level;Squat    Examination-Participation Restrictions Interpersonal Relationship;Cleaning;Laundry;Yard Work;Driving;Meal Prep;Shop    Stability/Clinical Decision Making Evolving/Moderate complexity    Clinical Decision Making Moderate    Rehab Potential Fair    PT Frequency 2x / week    PT Duration 4 weeks    PT Treatment/Interventions ADLs/Self Care Home Management;Aquatic Therapy;Neuromuscular re-education;Balance training;Therapeutic exercise;Therapeutic activities;Functional mobility training;Stair training;Gait training;DME Instruction;Patient/family education;Manual techniques;Energy conservation;Biofeedback;Electrical Stimulation;Moist Heat;Traction;Ultrasound;Dry needling;Passive range of motion;Vestibular;Vasopneumatic Device;Spinal Manipulations;Joint Manipulations    PT Next Visit Plan Dynamic balance/ gait activities.   Outside walking/ stairs.   CHECK GOALS/ SCHEDULE    Consulted and Agree with Plan of Care Patient             Patient will benefit from skilled therapeutic intervention in order to improve the following deficits and impairments:  Abnormal gait, Decreased endurance, Decreased activity tolerance, Decreased strength, Pain, Difficulty walking, Decreased mobility, Decreased balance, Decreased range of motion, Improper body mechanics, Postural dysfunction, Decreased safety awareness, Decreased coordination, Decreased knowledge of precautions, Decreased knowledge of use of DME, Dizziness, Hypomobility, Impaired flexibility, Impaired sensation, Impaired perceived functional ability  Visit Diagnosis: At high risk for injury related to fall  Bilateral leg weakness  Polio osteopathy of  lower leg, left (HCC)  Bilateral low back  pain, unspecified chronicity, unspecified whether sciatica present  Closed fracture of sacrum and coccyx, sequela     Problem List Patient Active Problem List   Diagnosis Date Noted   Aortic atherosclerosis ([place]) 12/29/2020   Deformity of toe of left foot 03/28/2020   H/O multiple pulmonary nodules 09/22/2019   Chronic cough 09/22/2019   Perennial allergic rhinitis 09/17/2019   Acute upper respiratory infection 08/28/2019   Weakness 08/28/2019   Acute bronchitis 06/01/2019   Cough productive of yellow sputum 06/01/2019   Vasomotor rhinitis 05/01/2019   Chronic kidney disease, stage II (mild) 03/14/2019   Gastroesophageal reflux disease without esophagitis 03/14/2019   Intractable episodic headache 03/14/2019   Need for prophylactic vaccination with combined diphtheria-tetanus-pertussis (DTaP) vaccine 03/14/2019   Renal cyst 11/27/2018   Abnormality of gait and mobility 11/27/2018   Hepatic steatosis 10/31/2018   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[place]) 10/31/2018   Elevated ferritin 10/31/2018   Vertigo 08/13/2018   Gastroenteritis 05/09/2018   Diarrhea 05/09/2018   Nausea 05/09/2018   Personal history of colon cancer    Polyp of sigmoid colon    Dysuria 02/23/2018   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 02/10/2018   Encounter for general adult medical examination with abnormal findings 02/10/2018   Acute non-recurrent pansinusitis 08/24/2017   Other headache syndrome 08/24/2017   Uncontrolled type 2 diabetes mellitus with hyperglycemia ([place]) 08/24/2017   Type 2 diabetes mellitus without complication, with long-term current use of insulin ([place]) 08/22/2017   Need for vaccination against Streptococcus pneumoniae using pneumococcal conjugate vaccine 13 08/22/2017   Essential hypertension 05/03/2017   Otalgia, bilateral 05/02/2017   Type 2 diabetes mellitus with hyperglycemia ([place]) 05/02/2017   Mixed hyperlipidemia 05/02/2017   Right lower quadrant pain 06/29/2016    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   History of colon cancer 04/13/2015   Cancer of ascending colon ([place]) 10/12/2014   Pura Spice, PT, DPT # [phone] 06/02/2021, 2:40 PM  [facility] [address] [place], [place], [address] Phone: [phone]   Fax:  [phone]  Name: Tiffany Hawkins MRN: [id number] Date of Birth: Nov 07, 1945

## 2021-06-04 ENCOUNTER — Encounter: Payer: Self-pay | Admitting: Nurse Practitioner

## 2021-06-07 ENCOUNTER — Other Ambulatory Visit: Payer: Self-pay

## 2021-06-07 ENCOUNTER — Ambulatory Visit: Payer: Medicare HMO | Admitting: Physical Therapy

## 2021-06-07 ENCOUNTER — Encounter: Payer: Self-pay | Admitting: Physical Therapy

## 2021-06-07 DIAGNOSIS — Z9181 History of falling: Secondary | ICD-10-CM

## 2021-06-07 DIAGNOSIS — S3210XS Unspecified fracture of sacrum, sequela: Secondary | ICD-10-CM | POA: Diagnosis not present

## 2021-06-07 DIAGNOSIS — M545 Low back pain, unspecified: Secondary | ICD-10-CM | POA: Diagnosis not present

## 2021-06-07 DIAGNOSIS — B91 Sequelae of poliomyelitis: Secondary | ICD-10-CM

## 2021-06-07 DIAGNOSIS — R29898 Other symptoms and signs involving the musculoskeletal system: Secondary | ICD-10-CM

## 2021-06-07 DIAGNOSIS — M89662 Osteopathy after poliomyelitis, left lower leg: Secondary | ICD-10-CM | POA: Diagnosis not present

## 2021-06-07 DIAGNOSIS — S322XXS Fracture of coccyx, sequela: Secondary | ICD-10-CM | POA: Diagnosis not present

## 2021-06-07 NOTE — Therapy (Signed)
[place] [facility] [address] [place], [place], [address] Phone: [phone]   Fax:  [phone]  Physical Therapy Treatment  Patient Details  Name: Tiffany Hawkins MRN: [id number] Date of Birth: Dec 23, 1945 Referring Provider (PT): Girtha Hake MD   Encounter Date: 06/07/2021   PT End of Session - 06/07/21 1350     Visit Number 38    Number of Visits 47    Date for PT Re-Evaluation 07/05/21    Authorization - Visit Number 8    Authorization - Number of Visits 10    Progress Note Due on Visit 10    PT Start Time 2637    PT Stop Time 1440    PT Time Calculation (min) 58 min    Equipment Utilized During Treatment Gait belt   4WW and FWW   Activity Tolerance Patient tolerated treatment well;Patient limited by fatigue   increased unsteadiness after BPPV treatment.   Behavior During Therapy [facility] for tasks assessed/performed             Past Medical History:  Diagnosis Date   Anxiety    Colon cancer ([place]) 10/12/2014   Stage IIB; had chemo   Diabetes mellitus without complication (HCC)    Hyperlipidemia    Hypertension    Lateral epicondylitis    Personal history of chemotherapy    Polio    childhood   Skin cancer    Sqamous Cell, Lt Calf   Sleep apnea    no CPAP, sx improved with wt loss    Past Surgical History:  Procedure Laterality Date   APPENDECTOMY     BACK SURGERY     CATARACT EXTRACTION W/PHACO Left 12/03/2017   Procedure: CATARACT EXTRACTION PHACO AND INTRAOCULAR LENS PLACEMENT ([place]) LEFT  DIABETIC;  Surgeon: Eulogio Bear, MD;  Location: [place];  Service: Ophthalmology;  Laterality: Left;  Diabetic - insulin   CATARACT EXTRACTION W/PHACO Right 01/14/2018   Procedure: CATARACT EXTRACTION PHACO AND INTRAOCULAR LENS PLACEMENT ([facility]) RIGHT DIABETIC;  Surgeon: Eulogio Bear, MD;  Location: [place];  Service: Ophthalmology;  Laterality: Right;  Diabetic - insulin   CERVICAL DISC  SURGERY     CHOLECYSTECTOMY     COLON SURGERY     COLONOSCOPY     COLONOSCOPY WITH PROPOFOL N/A 05/17/2015   Procedure: COLONOSCOPY WITH PROPOFOL;  Surgeon: Manya Silvas, MD;  Location: [facility] ENDOSCOPY;  Service: Endoscopy;  Laterality: N/A;   COLONOSCOPY WITH PROPOFOL N/A 04/01/2018   Procedure: COLONOSCOPY WITH BIOPSY;  Surgeon: [name], MD;  Location: [place];  Service: Endoscopy;  Laterality: N/A;  Diabetic - insulin   EMBOLIZATION N/A 01/24/2021   Procedure: EMBOLIZATION;  Surgeon: Algernon Huxley, MD;  Location: [place] CV LAB;  Service: Cardiovascular;  Laterality: N/A;   ESOPHAGOGASTRODUODENOSCOPY     HERNIA REPAIR     LOWER EXTREMITY VENOGRAPHY Left 01/24/2021   Procedure: LOWER EXTREMITY VENOGRAPHY;  Surgeon: Algernon Huxley, MD;  Location: [place] CV LAB;  Service: Cardiovascular;  Laterality: Left;   POLYPECTOMY N/A 04/01/2018   Procedure: POLYPECTOMY;  Surgeon: [name], MD;  Location: [place];  Service: Endoscopy;  Laterality: N/A;   PORT A CATH INJECTION ([place] [facility])     Port has been removed   TUBAL LIGATION      There were no vitals filed for this visit.   Subjective Assessment - 06/07/21 1347     Subjective Pt. reports going on a  walk this morning with no LOB or issues reported.  Pt. entered PT without assistive device and consistent gait pattern.  Pt. reports 4/10 pain in B quads with walking/ dynamic balance tasks during tx. session.  Discomfort resolves quickly during seated rest breaks.    Pertinent History per referring note: "Patient notes that she does have a history of chronic back pain which did worsen after her fall. She also endorses a history of polio. Over the past month she feels that she has been more off balance and has had more falls. She is also noticing worsening weakness in her legs. She rates her pain as an 8/10. Is intermittent, stiff. She denies any numbness, tingling or loss of control of bowel or bladder. " H/o  Hx cancer, previous back surgery, DM, vertigo, Aortic atherosclerosis ([place]).    Limitations Walking;House hold activities;Sitting;Lifting;Standing    Diagnostic tests MRI of lumbar and cervical spine. See imaging history.    Patient Stated Goals Reduce pain and improve balance.    Currently in Pain? No/denies    Pain Onset More than a month ago             Neuro Re-Ed:     Walking in //-bars with Airex/ 6" step ups and overs with CGA for safety 3x.  Mirror feedback    Walking in gym with no UE assist (1 lap).  Walking in //-bars: Forward/ backwards/ lateral 3x each.  Cuing to increase L hip flexion/ step length/ correct posture.    Walking on uneven terrain (grass) back of building.  Pt. Requires CGA/min. A from PT with LOB.  6" hurdles and cone pick ups.  (Moderate L LE muscle fatigue).  Difficulty leading with L LE during step overs    Standing 6" toe taps (alternating)- No UE assist.    Walking out to car with use of SPC and mod. A due to marked muscle fatigue in B LE at end of tx. Session.        There.ex:     Nustep L2 for 8 min. B UE/LE (no charge)  Standing hip ex. (No ankle wt)- all planes 20x each.       Discussed HEP      PT Long Term Goals - 06/08/21 0092       PT LONG TERM GOAL #1   Title Pt will improve MMT scores by 1/2 grade to promote functional strength gains for transfers, amb, and decreased caregiver burden.    Baseline 4+/4 Hip flexion  4/4-  Hip abduction  4+/4  Hip adduction  4+/4  Knee extension  4/4 Knee flexion 4/4 Ankle dorsiflexion  4/4 Ankle plantarflexion.  2/21:  B LE muscle strength (R/L): knee extension (4+/4+), knee flexion (4+/4), hip flexion (4+/4+), hip abduction (4+/4+), hip adduction (5/5), DF (4/4+).    Time 4    Period Weeks    Status Partially Met    Target Date 07/05/21      PT LONG TERM GOAL #2   Title Pt will improve BERG score by 5 pts to promote safety with ambulation and functional tasks.    Baseline BERG: 18/56.   11/29: 40/56.  2/21: 39/56    Time 4    Period Weeks    Status Partially Met    Target Date 07/05/21      PT LONG TERM GOAL #3   Title Pt will improve FOTO score to predicted improvement value of 56 to measure self reported improvement in functional mobility.  Baseline Initial eval: 36.   11/7: 54 (decrease in back pain but limitations in safe ambulation).  12/28: 68 (goal met)    Time 4    Period Weeks    Status Achieved    Target Date 04/13/21      PT LONG TERM GOAL #4   Title Pt will demonstrates safety with ambulation using FWW with no LOB and only occ verbal cueing and SBA with 10 minutes of overground walking.    Baseline Pt amb 1 min with extensive verbal cueing CGA-min A.  1/24: pt. using SPC occasionally and resistant to use of RW    Time 4    Period Weeks    Status Partially Met    Target Date 07/05/21      PT LONG TERM GOAL #5   Title Pt will decrease worse pain to 7/10 or less to promote greater access to functional mobility.    Baseline Present 7/10, Best 4/10, Worst 10/10.  11/29: <3/10 back pain.  2/21: 8/10 L LE pain at worse, best 0/10 and currently 5/10.    Time 4    Period Weeks    Status Partially Met    Target Date 07/05/21      PT LONG TERM GOAL #6   Title Pt. will report no falls consistently for 2 weeks with all aspects of walking at home/ in community with LRAD to improve safety/ mobility.    Baseline Pt. using SPC currently and has had a couple recent falls (no injury).  Pt. will benefit from rollator    Time 4    Period Weeks    Status Partially Met    Target Date 07/05/21                   Plan - 06/08/21 0753     Clinical Impression Statement Pt. states she feels PT has really helped her increase LE strength/ decrease fall risk at home.  PT continues to educate pt. on the importance of using an assistive device for safety, esp. with outside walking around home/ community.  B LE muscle strength (R/L): knee extension (4+/4+), knee  flexion (4+/4), hip flexion (4+/4+), hip abduction (4+/4+), hip adduction (5/5), DF (4/4+).  Pt. easily fatigued t/o tx. session and requires several short seated rest breaks.  CGA with all aspects of PT and occasionally min. A towards end of tx. due to L LE muscle fatigue/ increase balance issues/ scissoring gait without use of assistive device.  Pt. will continue to benefit from skilled PT services to focus on L LE strengthening to improve safety/ functional mobility/ walking at home.    Personal Factors and Comorbidities Comorbidity 3+;Age;Transportation;Fitness    Comorbidities Hx cancer, previous back surgery, DM, vertigo, Aortic atherosclerosis ([place])    Examination-Activity Limitations Stairs;Reach Overhead;Stand;Dressing;Sit;Transfers;Lift;Caring for Others;Carry;Locomotion Level;Squat    Examination-Participation Restrictions Interpersonal Relationship;Cleaning;Laundry;Yard Work;Driving;Meal Prep;Shop    Stability/Clinical Decision Making Evolving/Moderate complexity    Clinical Decision Making Moderate    Rehab Potential Fair    PT Frequency 2x / week    PT Duration 4 weeks    PT Treatment/Interventions ADLs/Self Care Home Management;Aquatic Therapy;Neuromuscular re-education;Balance training;Therapeutic exercise;Therapeutic activities;Functional mobility training;Stair training;Gait training;DME Instruction;Patient/family education;Manual techniques;Energy conservation;Biofeedback;Electrical Stimulation;Moist Heat;Traction;Ultrasound;Dry needling;Passive range of motion;Vestibular;Vasopneumatic Device;Spinal Manipulations;Joint Manipulations    PT Next Visit Plan Dynamic balance/ gait activities.   Outside walking/ stairs.    Consulted and Agree with Plan of Care Patient             Patient  will benefit from skilled therapeutic intervention in order to improve the following deficits and impairments:  Abnormal gait, Decreased endurance, Decreased activity tolerance, Decreased strength,  Pain, Difficulty walking, Decreased mobility, Decreased balance, Decreased range of motion, Improper body mechanics, Postural dysfunction, Decreased safety awareness, Decreased coordination, Decreased knowledge of precautions, Decreased knowledge of use of DME, Dizziness, Hypomobility, Impaired flexibility, Impaired sensation, Impaired perceived functional ability  Visit Diagnosis: At high risk for injury related to fall  Bilateral leg weakness  Polio osteopathy of lower leg, left (HCC)  Bilateral low back pain, unspecified chronicity, unspecified whether sciatica present  Closed fracture of sacrum and coccyx, sequela     Problem List Patient Active Problem List   Diagnosis Date Noted   Aortic atherosclerosis ([place]) 12/29/2020   Deformity of toe of left foot 03/28/2020   H/O multiple pulmonary nodules 09/22/2019   Chronic cough 09/22/2019   Perennial allergic rhinitis 09/17/2019   Acute upper respiratory infection 08/28/2019   Weakness 08/28/2019   Acute bronchitis 06/01/2019   Cough productive of yellow sputum 06/01/2019   Vasomotor rhinitis 05/01/2019   Chronic kidney disease, stage II (mild) 03/14/2019   Gastroesophageal reflux disease without esophagitis 03/14/2019   Intractable episodic headache 03/14/2019   Need for prophylactic vaccination with combined diphtheria-tetanus-pertussis (DTaP) vaccine 03/14/2019   Renal cyst 11/27/2018   Abnormality of gait and mobility 11/27/2018   Hepatic steatosis 10/31/2018   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[place]) 10/31/2018   Elevated ferritin 10/31/2018   Vertigo 08/13/2018   Gastroenteritis 05/09/2018   Diarrhea 05/09/2018   Nausea 05/09/2018   Personal history of colon cancer    Polyp of sigmoid colon    Dysuria 02/23/2018   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 02/10/2018   Encounter for general adult medical examination with abnormal findings 02/10/2018   Acute non-recurrent pansinusitis 08/24/2017   Other  headache syndrome 08/24/2017   Uncontrolled type 2 diabetes mellitus with hyperglycemia ([place]) 08/24/2017   Type 2 diabetes mellitus without complication, with long-term current use of insulin ([place]) 08/22/2017   Need for vaccination against Streptococcus pneumoniae using pneumococcal conjugate vaccine 13 08/22/2017   Essential hypertension 05/03/2017   Otalgia, bilateral 05/02/2017   Type 2 diabetes mellitus with hyperglycemia ([place]) 05/02/2017   Mixed hyperlipidemia 05/02/2017   Right lower quadrant pain 06/29/2016   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   History of colon cancer 04/13/2015   Cancer of ascending colon ([place]) 10/12/2014   Pura Spice, PT, DPT # [id number] 06/08/2021, 9:35 AM  [place] [facility] [address] [place], [place], [address] Phone: [phone]   Fax:  [phone]  Name: Tiffany Hawkins MRN: [id number] Date of Birth: 02-21-1946

## 2021-06-09 ENCOUNTER — Ambulatory Visit: Payer: Medicare HMO | Admitting: Physical Therapy

## 2021-06-09 ENCOUNTER — Encounter: Payer: Self-pay | Admitting: Physical Therapy

## 2021-06-09 ENCOUNTER — Other Ambulatory Visit: Payer: Self-pay

## 2021-06-09 DIAGNOSIS — R29898 Other symptoms and signs involving the musculoskeletal system: Secondary | ICD-10-CM

## 2021-06-09 DIAGNOSIS — M545 Low back pain, unspecified: Secondary | ICD-10-CM | POA: Diagnosis not present

## 2021-06-09 DIAGNOSIS — S322XXS Fracture of coccyx, sequela: Secondary | ICD-10-CM | POA: Diagnosis not present

## 2021-06-09 DIAGNOSIS — B91 Sequelae of poliomyelitis: Secondary | ICD-10-CM

## 2021-06-09 DIAGNOSIS — M89662 Osteopathy after poliomyelitis, left lower leg: Secondary | ICD-10-CM | POA: Diagnosis not present

## 2021-06-09 DIAGNOSIS — Z9181 History of falling: Secondary | ICD-10-CM | POA: Diagnosis not present

## 2021-06-09 DIAGNOSIS — S3210XS Unspecified fracture of sacrum, sequela: Secondary | ICD-10-CM | POA: Diagnosis not present

## 2021-06-09 NOTE — Therapy (Addendum)
[facility] [address] [place], [place], [address] Phone: [phone]   Fax:  [phone]  Physical Therapy Treatment  Patient Details  Name: JOURNEI THOMASSEN MRN: [id number] Date of Birth: [birth year] Referring Provider (PT): Girtha Hake MD   Encounter Date: 06/09/2021   PT End of Session - 06/09/21 1501     Visit Number 39    Number of Visits 47    Date for PT Re-Evaluation 07/05/21    Authorization - Visit Number 9    Authorization - Number of Visits 10    Progress Note Due on Visit 10    PT Start Time 5176    PT Stop Time 1425    PT Time Calculation (min) 48 min    Equipment Utilized During Treatment Gait belt   4WW and FWW   Activity Tolerance Patient tolerated treatment well;Patient limited by fatigue   increased unsteadiness after BPPV treatment.   Behavior During Therapy [facility] for tasks assessed/performed             Past Medical History:  Diagnosis Date   Anxiety    Colon cancer ([place]) 10/12/2014   Stage IIB; had chemo   Diabetes mellitus without complication (HCC)    Hyperlipidemia    Hypertension    Lateral epicondylitis    Personal history of chemotherapy    Polio    childhood   Skin cancer    Sqamous Cell, Lt Calf   Sleep apnea    no CPAP, sx improved with wt loss    Past Surgical History:  Procedure Laterality Date   APPENDECTOMY     BACK SURGERY     CATARACT EXTRACTION W/PHACO Left 12/03/2017   Procedure: CATARACT EXTRACTION PHACO AND INTRAOCULAR LENS PLACEMENT ([place]) LEFT  DIABETIC;  Surgeon: Eulogio Bear, MD;  Location: [place];  Service: Ophthalmology;  Laterality: Left;  Diabetic - insulin   CATARACT EXTRACTION W/PHACO Right 01/14/2018   Procedure: CATARACT EXTRACTION PHACO AND INTRAOCULAR LENS PLACEMENT ([facility]) RIGHT DIABETIC;  Surgeon: Eulogio Bear, MD;  Location: [place];  Service: Ophthalmology;  Laterality: Right;  Diabetic - insulin   CERVICAL DISC  SURGERY     CHOLECYSTECTOMY     COLON SURGERY     COLONOSCOPY     COLONOSCOPY WITH PROPOFOL N/A 05/17/2015   Procedure: COLONOSCOPY WITH PROPOFOL;  Surgeon: Manya Silvas, MD;  Location: [facility] ENDOSCOPY;  Service: Endoscopy;  Laterality: N/A;   COLONOSCOPY WITH PROPOFOL N/A 04/01/2018   Procedure: COLONOSCOPY WITH BIOPSY;  Surgeon: [name], MD;  Location: [place];  Service: Endoscopy;  Laterality: N/A;  Diabetic - insulin   EMBOLIZATION N/A 01/24/2021   Procedure: EMBOLIZATION;  Surgeon: Algernon Huxley, MD;  Location: [place] CV LAB;  Service: Cardiovascular;  Laterality: N/A;   ESOPHAGOGASTRODUODENOSCOPY     HERNIA REPAIR     LOWER EXTREMITY VENOGRAPHY Left 01/24/2021   Procedure: LOWER EXTREMITY VENOGRAPHY;  Surgeon: Algernon Huxley, MD;  Location: [place] [facility];  Service: Cardiovascular;  Laterality: Left;   POLYPECTOMY N/A 04/01/2018   Procedure: POLYPECTOMY;  Surgeon: [name], MD;  Location: [place];  Service: Endoscopy;  Laterality: N/A;   PORT A CATH INJECTION ([place] [facility])     Port has been removed   TUBAL LIGATION      There were no vitals filed for this visit.   Subjective Assessment - 06/09/21 1500     Subjective Pt. reports no pain when  arriving to clinic. Pt. went for a walk this morning with no AD and states she was at a high risk of falling since she was by herself. Pt. arrived to clinic with [facility].    Pertinent History per referring note: "Patient notes that she does have a history of chronic back pain which did worsen after her fall. She also endorses a history of polio. Over the past month she feels that she has been more off balance and has had more falls. She is also noticing worsening weakness in her legs. She rates her pain as an 8/10. Is intermittent, stiff. She denies any numbness, tingling or loss of control of bowel or bladder. " H/o Hx cancer, previous back surgery, DM, vertigo, Aortic atherosclerosis ([place]).    Limitations  Walking;House hold activities;Sitting;Lifting;Standing    Diagnostic tests MRI of lumbar and cervical spine. See imaging history.    Patient Stated Goals Reduce pain and improve balance.    Currently in Pain? No/denies    Pain Onset More than a month ago             Neuro Re-Ed:     Walking in gym with no UE assist (1 lap).  Walking in //-bars: Forward/ backwards 3x each.  Cuing to increase L hip flexion/ step length/ correct posture. Pt. Has LOB that requires the PT to catch her.  Walking on uneven terrain (grass) back of building.  Pt. Requires CGA/min. A from PT with LOB.  6" hurdles and zig zag through cones  (Moderate L LE muscle fatigue).  Difficulty leading with L LE during step overs  8" steps outside. Pt. Struggles to use correct LE when ascending/descending stairs. Pt. Stumbles and requires Max. A from PT to regain balance.  Walking out to car with use of SPC and mod. A due to marked muscle fatigue in B LE at end of tx. Session.    There.ex:     Nustep L1 for 4 min. B UE/LE (no charge) Seated ball squeeze/LAQ with PT resistance 10x Seated bicep curls/ shoulder flexion 1#10x     PT Long Term Goals - 06/08/21 0928       PT LONG TERM GOAL #1   Title Pt will improve MMT scores by 1/2 grade to promote functional strength gains for transfers, amb, and decreased caregiver burden.    Baseline 4+/4 Hip flexion  4/4-  Hip abduction  4+/4  Hip adduction  4+/4  Knee extension  4/4 Knee flexion 4/4 Ankle dorsiflexion  4/4 Ankle plantarflexion.  2/21:  B LE muscle strength (R/L): knee extension (4+/4+), knee flexion (4+/4), hip flexion (4+/4+), hip abduction (4+/4+), hip adduction (5/5), DF (4/4+).    Time 4    Period Weeks    Status Partially Met    Target Date 07/05/21      PT LONG TERM GOAL #2   Title Pt will improve BERG score by 5 pts to promote safety with ambulation and functional tasks.    Baseline BERG: 18/56.  11/29: 40/56.  2/21: 39/56    Time 4    Period Weeks     Status Partially Met    Target Date 07/05/21      PT LONG TERM GOAL #3   Title Pt will improve FOTO score to predicted improvement value of 56 to measure self reported improvement in functional mobility.    Baseline Initial eval: 36.   11/7: 54 (decrease in back pain but limitations in safe ambulation).  12/28: 68 (goal  met)    Time 4    Period Weeks    Status Achieved    Target Date 04/13/21      PT LONG TERM GOAL #4   Title Pt will demonstrates safety with ambulation using FWW with no LOB and only occ verbal cueing and SBA with 10 minutes of overground walking.    Baseline Pt amb 1 min with extensive verbal cueing CGA-min A.  1/24: pt. using SPC occasionally and resistant to use of RW    Time 4    Period Weeks    Status Partially Met    Target Date 07/05/21      PT LONG TERM GOAL #5   Title Pt will decrease worse pain to 7/10 or less to promote greater access to functional mobility.    Baseline Present 7/10, Best 4/10, Worst 10/10.  11/29: <3/10 back pain.  2/21: 8/10 L LE pain at worse, best 0/10 and currently 5/10.    Time 4    Period Weeks    Status Partially Met    Target Date 07/05/21      PT LONG TERM GOAL #6   Title Pt. will report no falls consistently for 2 weeks with all aspects of walking at home/ in community with LRAD to improve safety/ mobility.    Baseline Pt. using SPC currently and has had a couple recent falls (no injury).  Pt. will benefit from rollator    Time 4    Period Weeks    Status Partially Met    Target Date 07/05/21                Plan - 06/09/21 1502     Clinical Impression Statement Pt. has no pain when arriving to the clinic but had several LOB that require Max A. by the PT to regain balance at the beginning of the session. Pt. expresses she is feeling extreme fatigue throughout the session and is concerned she will not get better. Pt. was educated on proper mechanics to ascend/ descend stairs; Pt. regularly uses L LE to ascend stairs and  is taught to use R LE to ascend as that is her stronger leg. Pt. will continue to work on dynamic gait exercises on uneven terrain to reduce her fall risk as well as sitting LE endurance exercises to reduce her level of fatigue when performing household activities.    Personal Factors and Comorbidities Comorbidity 3+;Age;Transportation;Fitness    Comorbidities Hx cancer, previous back surgery, DM, vertigo, Aortic atherosclerosis ([place])    Examination-Activity Limitations Stairs;Reach Overhead;Stand;Dressing;Sit;Transfers;Lift;Caring for Others;Carry;Locomotion Level;Squat    Examination-Participation Restrictions Interpersonal Relationship;Cleaning;Laundry;Yard Work;Driving;Meal Prep;Shop    Stability/Clinical Decision Making Evolving/Moderate complexity    Clinical Decision Making Moderate    Rehab Potential Fair    PT Frequency 2x / week    PT Duration 4 weeks    PT Treatment/Interventions ADLs/Self Care Home Management;Aquatic Therapy;Neuromuscular re-education;Balance training;Therapeutic exercise;Therapeutic activities;Functional mobility training;Stair training;Gait training;DME Instruction;Patient/family education;Manual techniques;Energy conservation;Biofeedback;Electrical Stimulation;Moist Heat;Traction;Ultrasound;Dry needling;Passive range of motion;Vestibular;Vasopneumatic Device;Spinal Manipulations;Joint Manipulations    PT Next Visit Plan Dynamic balance/ gait activities.   Outside walking/ stairs.    Consulted and Agree with Plan of Care Patient             Patient will benefit from skilled therapeutic intervention in order to improve the following deficits and impairments:  Abnormal gait, Decreased endurance, Decreased activity tolerance, Decreased strength, Pain, Difficulty walking, Decreased mobility, Decreased balance, Decreased range of motion, Improper body mechanics, Postural dysfunction, Decreased  safety awareness, Decreased coordination, Decreased knowledge of precautions,  Decreased knowledge of use of DME, Dizziness, Hypomobility, Impaired flexibility, Impaired sensation, Impaired perceived functional ability  Visit Diagnosis: Bilateral leg weakness  Polio osteopathy of lower leg, left ([place])  At high risk for injury related to fall     Problem List Patient Active Problem List   Diagnosis Date Noted   Aortic atherosclerosis ([place]) 12/29/2020   Deformity of toe of left foot 03/28/2020   H/O multiple pulmonary nodules 09/22/2019   Chronic cough 09/22/2019   Perennial allergic rhinitis 09/17/2019   Acute upper respiratory infection 08/28/2019   Weakness 08/28/2019   Acute bronchitis 06/01/2019   Cough productive of yellow sputum 06/01/2019   Vasomotor rhinitis 05/01/2019   Chronic kidney disease, stage II (mild) 03/14/2019   Gastroesophageal reflux disease without esophagitis 03/14/2019   Intractable episodic headache 03/14/2019   Need for prophylactic vaccination with combined diphtheria-tetanus-pertussis (DTaP) vaccine 03/14/2019   Renal cyst 11/27/2018   Abnormality of gait and mobility 11/27/2018   Hepatic steatosis 10/31/2018   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[place]) 10/31/2018   Elevated ferritin 10/31/2018   Vertigo 08/13/2018   Gastroenteritis 05/09/2018   Diarrhea 05/09/2018   Nausea 05/09/2018   Personal history of colon cancer    Polyp of sigmoid colon    Dysuria 02/23/2018   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 02/10/2018   Encounter for general adult medical examination with abnormal findings 02/10/2018   Acute non-recurrent pansinusitis 08/24/2017   Other headache syndrome 08/24/2017   Uncontrolled type 2 diabetes mellitus with hyperglycemia ([place]) 08/24/2017   Type 2 diabetes mellitus without complication, with long-term current use of insulin ([place]) 08/22/2017   Need for vaccination against Streptococcus pneumoniae using pneumococcal conjugate vaccine 13 08/22/2017   Essential hypertension 05/03/2017   Otalgia,  bilateral 05/02/2017   Type 2 diabetes mellitus with hyperglycemia ([place]) 05/02/2017   Mixed hyperlipidemia 05/02/2017   Right lower quadrant pain 06/29/2016   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   History of colon cancer 04/13/2015   Cancer of ascending colon ([place]) 10/12/2014   Pura Spice, PT, DPT # [place], SPT 06/09/2021, 4:20 PM  [facility] [address] [place], [place], [address] Phone: [phone]   Fax:  [phone]  Name: SHAMAYA KAUER MRN: [id number] Date of Birth: [birth year]

## 2021-06-14 ENCOUNTER — Other Ambulatory Visit: Payer: Self-pay

## 2021-06-14 ENCOUNTER — Encounter: Payer: Self-pay | Admitting: Physical Therapy

## 2021-06-14 ENCOUNTER — Ambulatory Visit: Payer: Medicare HMO | Admitting: Physical Therapy

## 2021-06-14 DIAGNOSIS — S322XXS Fracture of coccyx, sequela: Secondary | ICD-10-CM | POA: Diagnosis not present

## 2021-06-14 DIAGNOSIS — B91 Sequelae of poliomyelitis: Secondary | ICD-10-CM | POA: Diagnosis not present

## 2021-06-14 DIAGNOSIS — M545 Low back pain, unspecified: Secondary | ICD-10-CM | POA: Diagnosis not present

## 2021-06-14 DIAGNOSIS — Z9181 History of falling: Secondary | ICD-10-CM | POA: Diagnosis not present

## 2021-06-14 DIAGNOSIS — R29898 Other symptoms and signs involving the musculoskeletal system: Secondary | ICD-10-CM

## 2021-06-14 DIAGNOSIS — M89662 Osteopathy after poliomyelitis, left lower leg: Secondary | ICD-10-CM | POA: Diagnosis not present

## 2021-06-14 DIAGNOSIS — S3210XS Unspecified fracture of sacrum, sequela: Secondary | ICD-10-CM | POA: Diagnosis not present

## 2021-06-14 NOTE — Therapy (Addendum)
[facility] [address] [place], [place], [address] Phone: [phone]   Fax:  [phone]  Physical Therapy Treatment Physical Therapy Progress Note   Dates of reporting period  05/12/21  to 06/14/21.  Patient Details  Name: Tiffany Hawkins MRN: [id number] Date of Birth: 04/24/1945 Referring Provider (PT): Girtha Hake MD   Encounter Date: 06/14/2021   PT End of Session - 06/14/21 1350     Visit Number 40    Number of Visits 29    Date for PT Re-Evaluation 07/05/21    Authorization - Visit Number 10    Authorization - Number of Visits 10    Progress Note Due on Visit 10    PT Start Time 7169    PT Stop Time 1429    PT Time Calculation (min) 45 min    Equipment Utilized During Treatment Gait belt    Activity Tolerance Patient tolerated treatment well    Behavior During Therapy WFL for tasks assessed/performed             Past Medical History:  Diagnosis Date   Anxiety    Colon cancer ([place]) 10/12/2014   Stage IIB; had chemo   Diabetes mellitus without complication ([place])    Hyperlipidemia    Hypertension    Lateral epicondylitis    Personal history of chemotherapy    Polio    childhood   Skin cancer    Sqamous Cell, Lt Calf   Sleep apnea    no CPAP, sx improved with wt loss    Past Surgical History:  Procedure Laterality Date   APPENDECTOMY     BACK SURGERY     CATARACT EXTRACTION W/PHACO Left 12/03/2017   Procedure: CATARACT EXTRACTION PHACO AND INTRAOCULAR LENS PLACEMENT ([place]) LEFT  DIABETIC;  Surgeon: Eulogio Bear, MD;  Location: [place];  Service: Ophthalmology;  Laterality: Left;  Diabetic - insulin   CATARACT EXTRACTION W/PHACO Right 01/14/2018   Procedure: CATARACT EXTRACTION PHACO AND INTRAOCULAR LENS PLACEMENT ([facility]) RIGHT DIABETIC;  Surgeon: Eulogio Bear, MD;  Location: [place];  Service: Ophthalmology;  Laterality: Right;  Diabetic - insulin   CERVICAL DISC  SURGERY     CHOLECYSTECTOMY     COLON SURGERY     COLONOSCOPY     COLONOSCOPY WITH PROPOFOL N/A 05/17/2015   Procedure: COLONOSCOPY WITH PROPOFOL;  Surgeon: Manya Silvas, MD;  Location[facility];  Service: Endoscopy;  Laterality: N/A;   COLONOSCOPY WITH PROPOFOL N/A 04/01/2018   Procedure: COLONOSCOPY WITH BIOPSY;  Surgeon: [name], MD;  Location: [place];  Service: Endoscopy;  Laterality: N/A;  Diabetic - insulin   EMBOLIZATION N/A 01/24/2021   Procedure: EMBOLIZATION;  Surgeon: Algernon Huxley, MD;  Location: [place] [facility];  Service: Cardiovascular;  Laterality: N/A;   ESOPHAGOGASTRODUODENOSCOPY     HERNIA REPAIR     LOWER EXTREMITY VENOGRAPHY Left 01/24/2021   Procedure: LOWER EXTREMITY VENOGRAPHY;  Surgeon: Algernon Huxley, MD;  Location: [place] [facility];  Service: Cardiovascular;  Laterality: Left;   POLYPECTOMY N/A 04/01/2018   Procedure: POLYPECTOMY;  Surgeon: [name], MD;  Location: [place];  Service: Endoscopy;  Laterality: N/A;   PORT A CATH INJECTION ([place] HX)     Port has been removed   TUBAL LIGATION      There were no vitals filed for this visit.   Subjective Assessment - 06/14/21 1345     Subjective Pt. reports no  LE pain when arriving to clinic but states she has some LBP after doing laundry. Pt. arrives to linic with no AD.    Pertinent History per referring note: "Patient notes that she does have a history of chronic back pain which did worsen after her fall. She also endorses a history of polio. Over the past month she feels that she has been more off balance and has had more falls. She is also noticing worsening weakness in her legs. She rates her pain as an 8/10. Is intermittent, stiff. She denies any numbness, tingling or loss of control of bowel or bladder. " H/o Hx cancer, previous back surgery, DM, vertigo, Aortic atherosclerosis ([place]).    Limitations Walking;House hold activities;Sitting;Lifting;Standing     Diagnostic tests MRI of lumbar and cervical spine. See imaging history.    Patient Stated Goals Reduce pain and improve balance.    Currently in Pain? No/denies    Pain Score 0-No pain    Pain Onset More than a month ago              Neuro Re-Ed:    Walking on agility ladder focusing on even stride length. 3x Walking on agility ladder with alternating toe taps to cones/ picking up cones. 4x each. Pt. Requires CGA and has some LOB when performing toe taps that she is able to self correct.  Standing tolerance with fishing. Pt. Performs 2x for 3 mintues and then 4 minutes requiring some correction form PT after LOB Walking on uneven terrain (grass) around side of building. Pt. Requires CGA/min. A from PT with LOB and a break on the bench outside. Walking out to car with use of SPC and mod. A due to marked muscle fatigue in B LE at end of tx. Session.    There.ex:     Nustep L1 for 8 min. B UE/LE (no charge) Seated bicep curls 3#/ shoulder flexion 1#10x Discussed/reviewed HEP      PT Long Term Goals - 06/08/21 5449       PT LONG TERM GOAL #1   Title Pt will improve MMT scores by 1/2 grade to promote functional strength gains for transfers, amb, and decreased caregiver burden.    Baseline 4+/4 Hip flexion  4/4-  Hip abduction  4+/4  Hip adduction  4+/4  Knee extension  4/4 Knee flexion 4/4 Ankle dorsiflexion  4/4 Ankle plantarflexion.  2/21:  B LE muscle strength (R/L): knee extension (4+/4+), knee flexion (4+/4), hip flexion (4+/4+), hip abduction (4+/4+), hip adduction (5/5), DF (4/4+).    Time 4    Period Weeks    Status Partially Met    Target Date 07/05/21      PT LONG TERM GOAL #2   Title Pt will improve BERG score by 5 pts to promote safety with ambulation and functional tasks.    Baseline BERG: 18/56.  11/29: 40/56.  2/21: 39/56    Time 4    Period Weeks    Status Partially Met    Target Date 07/05/21      PT LONG TERM GOAL #3   Title Pt will improve FOTO score to  predicted improvement value of 56 to measure self reported improvement in functional mobility.    Baseline Initial eval: 36.   11/7: 54 (decrease in back pain but limitations in safe ambulation).  12/28: 68 (goal met)    Time 4    Period Weeks    Status Achieved    Target Date 04/13/21  PT LONG TERM GOAL #4   Title Pt will demonstrates safety with ambulation using FWW with no LOB and only occ verbal cueing and SBA with 10 minutes of overground walking.    Baseline Pt amb 1 min with extensive verbal cueing CGA-min A.  1/24: pt. using SPC occasionally and resistant to use of RW    Time 4    Period Weeks    Status Partially Met    Target Date 07/05/21      PT LONG TERM GOAL #5   Title Pt will decrease worse pain to 7/10 or less to promote greater access to functional mobility.    Baseline Present 7/10, Best 4/10, Worst 10/10.  11/29: <3/10 back pain.  2/21: 8/10 L LE pain at worse, best 0/10 and currently 5/10.    Time 4    Period Weeks    Status Partially Met    Target Date 07/05/21      PT LONG TERM GOAL #6   Title Pt. will report no falls consistently for 2 weeks with all aspects of walking at home/ in community with LRAD to improve safety/ mobility.    Baseline Pt. using SPC currently and has had a couple recent falls (no injury).  Pt. will benefit from rollator    Time 4    Period Weeks    Status Partially Met    Target Date 07/05/21                   Plan - 06/14/21 1424     Clinical Impression Statement Pt. has no pain throughout the treatment session and is able to perform all dynamic exercises with proper form. Pt. has minor LOB throughout session but is able to self correct without PT assist. Pt. is educated on the importance of using a SPC for all community and household ambulation for safety as she tends to ambulate with no AD, putting herself at a high fall risk. Pt. will continue to work on balance and ambulation on uneven terrain with AD so that she is able  to safely perform ADLs and reduce her fall risk.    Personal Factors and Comorbidities Comorbidity 3+;Age;Transportation;Fitness    Comorbidities Hx cancer, previous back surgery, DM, vertigo, Aortic atherosclerosis ([place])    Examination-Activity Limitations Stairs;Reach Overhead;Stand;Dressing;Sit;Transfers;Lift;Caring for Others;Carry;Locomotion Level;Squat    Examination-Participation Restrictions Interpersonal Relationship;Cleaning;Laundry;Yard Work;Driving;Meal Prep;Shop    Stability/Clinical Decision Making Evolving/Moderate complexity    Clinical Decision Making Moderate    Rehab Potential Fair    PT Frequency 2x / week    PT Duration 4 weeks    PT Treatment/Interventions ADLs/Self Care Home Management;Aquatic Therapy;Neuromuscular re-education;Balance training;Therapeutic exercise;Therapeutic activities;Functional mobility training;Stair training;Gait training;DME Instruction;Patient/family education;Manual techniques;Energy conservation;Biofeedback;Electrical Stimulation;Moist Heat;Traction;Ultrasound;Dry needling;Passive range of motion;Vestibular;Vasopneumatic Device;Spinal Manipulations;Joint Manipulations    PT Next Visit Plan Dynamic balance/ gait activities.   Outside walking/ stairs.    Consulted and Agree with Plan of Care Patient             Patient will benefit from skilled therapeutic intervention in order to improve the following deficits and impairments:  Abnormal gait, Decreased endurance, Decreased activity tolerance, Decreased strength, Pain, Difficulty walking, Decreased mobility, Decreased balance, Decreased range of motion, Improper body mechanics, Postural dysfunction, Decreased safety awareness, Decreased coordination, Decreased knowledge of precautions, Decreased knowledge of use of DME, Dizziness, Hypomobility, Impaired flexibility, Impaired sensation, Impaired perceived functional ability  Visit Diagnosis: Bilateral leg weakness  At high risk for injury  related to fall  Problem List Patient Active Problem List   Diagnosis Date Noted   Aortic atherosclerosis ([place]) 12/29/2020   Deformity of toe of left foot 03/28/2020   H/O multiple pulmonary nodules 09/22/2019   Chronic cough 09/22/2019   Perennial allergic rhinitis 09/17/2019   Acute upper respiratory infection 08/28/2019   Weakness 08/28/2019   Acute bronchitis 06/01/2019   Cough productive of yellow sputum 06/01/2019   Vasomotor rhinitis 05/01/2019   Chronic kidney disease, stage II (mild) 03/14/2019   Gastroesophageal reflux disease without esophagitis 03/14/2019   Intractable episodic headache 03/14/2019   Need for prophylactic vaccination with combined diphtheria-tetanus-pertussis (DTaP) vaccine 03/14/2019   Renal cyst 11/27/2018   Abnormality of gait and mobility 11/27/2018   Hepatic steatosis 10/31/2018   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[place]) 10/31/2018   Elevated ferritin 10/31/2018   Vertigo 08/13/2018   Gastroenteritis 05/09/2018   Diarrhea 05/09/2018   Nausea 05/09/2018   Personal history of colon cancer    Polyp of sigmoid colon    Dysuria 02/23/2018   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 02/10/2018   Encounter for general adult medical examination with abnormal findings 02/10/2018   Acute non-recurrent pansinusitis 08/24/2017   Other headache syndrome 08/24/2017   Uncontrolled type 2 diabetes mellitus with hyperglycemia ([place]) 08/24/2017   Type 2 diabetes mellitus without complication, with long-term current use of insulin ([place]) 08/22/2017   Need for vaccination against Streptococcus pneumoniae using pneumococcal conjugate vaccine 13 08/22/2017   Essential hypertension 05/03/2017   Otalgia, bilateral 05/02/2017   Type 2 diabetes mellitus with hyperglycemia ([place]) 05/02/2017   Mixed hyperlipidemia 05/02/2017   Right lower quadrant pain 06/29/2016   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   History of colon  cancer 04/13/2015   Cancer of ascending colon ([place]) 10/12/2014   Pura Spice, PT, DPT # [place], SPT 06/14/2021, 4:26 PM  [facility] [address] [place], [place], [address] Phone: [phone]   Fax:  [phone]  Name: BRIARROSE SHOR MRN: [id number] Date of Birth: 1945-12-24

## 2021-06-16 ENCOUNTER — Other Ambulatory Visit: Payer: Self-pay

## 2021-06-16 ENCOUNTER — Ambulatory Visit: Payer: Medicare HMO | Attending: Physical Medicine & Rehabilitation | Admitting: Physical Therapy

## 2021-06-16 ENCOUNTER — Encounter: Payer: Self-pay | Admitting: Physical Therapy

## 2021-06-16 DIAGNOSIS — M89662 Osteopathy after poliomyelitis, left lower leg: Secondary | ICD-10-CM | POA: Diagnosis not present

## 2021-06-16 DIAGNOSIS — S3210XS Unspecified fracture of sacrum, sequela: Secondary | ICD-10-CM | POA: Diagnosis not present

## 2021-06-16 DIAGNOSIS — Z9181 History of falling: Secondary | ICD-10-CM | POA: Insufficient documentation

## 2021-06-16 DIAGNOSIS — M545 Low back pain, unspecified: Secondary | ICD-10-CM | POA: Diagnosis not present

## 2021-06-16 DIAGNOSIS — S322XXS Fracture of coccyx, sequela: Secondary | ICD-10-CM | POA: Diagnosis not present

## 2021-06-16 DIAGNOSIS — R29898 Other symptoms and signs involving the musculoskeletal system: Secondary | ICD-10-CM | POA: Insufficient documentation

## 2021-06-16 DIAGNOSIS — B91 Sequelae of poliomyelitis: Secondary | ICD-10-CM | POA: Insufficient documentation

## 2021-06-16 NOTE — Therapy (Signed)
[facility] [address] [place], [place], [address] Phone: [phone]   Fax:  [phone]  Physical Therapy Treatment  Patient Details  Name: Tiffany Hawkins MRN: [id number] Date of Birth: 1945-05-07 Referring Provider (PT): Girtha Hake MD   Encounter Date: 06/16/2021   PT End of Session - 06/16/21 1342     Visit Number 41    Number of Visits 51    Date for PT Re-Evaluation 07/05/21    Authorization - Visit Number 1    Authorization - Number of Visits 10    Progress Note Due on Visit 10    PT Start Time 1343    PT Stop Time 1432    PT Time Calculation (min) 49 min    Equipment Utilized During Treatment Gait belt    Activity Tolerance Patient tolerated treatment well    Behavior During Therapy [facility] for tasks assessed/performed             Past Medical History:  Diagnosis Date   Anxiety    Colon cancer ([place]) 10/12/2014   Stage IIB; had chemo   Diabetes mellitus without complication ([place])    Hyperlipidemia    Hypertension    Lateral epicondylitis    Personal history of chemotherapy    Polio    childhood   Skin cancer    Sqamous Cell, Lt Calf   Sleep apnea    no CPAP, sx improved with wt loss    Past Surgical History:  Procedure Laterality Date   APPENDECTOMY     BACK SURGERY     CATARACT EXTRACTION W/PHACO Left 12/03/2017   Procedure: CATARACT EXTRACTION PHACO AND INTRAOCULAR LENS PLACEMENT ([place]) LEFT  DIABETIC;  Surgeon: Eulogio Bear, MD;  Location: [place];  Service: Ophthalmology;  Laterality: Left;  Diabetic - insulin   CATARACT EXTRACTION W/PHACO Right 01/14/2018   Procedure: CATARACT EXTRACTION PHACO AND INTRAOCULAR LENS PLACEMENT ([facility]) RIGHT DIABETIC;  Surgeon: Eulogio Bear, MD;  Location: [place];  Service: Ophthalmology;  Laterality: Right;  Diabetic - insulin   CERVICAL DISC SURGERY     CHOLECYSTECTOMY     COLON SURGERY     COLONOSCOPY     COLONOSCOPY WITH  PROPOFOL N/A 05/17/2015   Procedure: COLONOSCOPY WITH PROPOFOL;  Surgeon: Manya Silvas, MD;  Location: [facility]OSCOPY;  Service: Endoscopy;  Laterality: N/A;   COLONOSCOPY WITH PROPOFOL N/A 04/01/2018   Procedure: COLONOSCOPY WITH BIOPSY;  Surgeon: [name], MD;  Location: [place];  Service: Endoscopy;  Laterality: N/A;  Diabetic - insulin   EMBOLIZATION N/A 01/24/2021   Procedure: EMBOLIZATION;  Surgeon: Algernon Huxley, MD;  Location: [place] [facility];  Service: Cardiovascular;  Laterality: N/A;   ESOPHAGOGASTRODUODENOSCOPY     HERNIA REPAIR     LOWER EXTREMITY VENOGRAPHY Left 01/24/2021   Procedure: LOWER EXTREMITY VENOGRAPHY;  Surgeon: Algernon Huxley, MD;  Location: [place] [facility];  Service: Cardiovascular;  Laterality: Left;   POLYPECTOMY N/A 04/01/2018   Procedure: POLYPECTOMY;  Surgeon: [name], MD;  Location: [place];  Service: Endoscopy;  Laterality: N/A;   PORT A CATH INJECTION ([place] HX)     Port has been removed   TUBAL LIGATION      There were no vitals filed for this visit.   Subjective Assessment - 06/16/21 1453     Subjective Pt. reports no new complaints prior to tx. session but later in the tx. pt. reports she fell  yesterday.  Pt. states she forgot that she fell and had limited information about how the fall occured.   ° Pertinent History per referring note: "Patient notes that she does have a history of chronic back pain which did worsen after her fall. She also endorses a history of polio. Over the past month she feels that she has been more off balance and has had more falls. She is also noticing worsening weakness in her legs. She rates her pain as an 8/10. Is intermittent, stiff. She denies any numbness, tingling or loss of control of bowel or bladder. " H/o Hx cancer, previous back surgery, DM, vertigo, Aortic atherosclerosis (HCC).   ° Limitations Walking;House hold activities;Sitting;Lifting;Standing   ° Diagnostic tests MRI of  lumbar and cervical spine. See imaging history.   ° Patient Stated Goals Reduce pain and improve balance.   ° Currently in Pain? No/denies   ° Pain Onset More than a month ago   ° °  °  ° °  ° ° ° ° ° °Neuro Re-Ed:   ° °4-square step test: 16.2 seconds °  °Walking at agility ladder focusing on even stride length/ upright posture 3 laps.  Walking on agility ladder with alternating toe taps to cones/ picking up cones. 4x each. Pt. Requires CGA and has some LOB when performing toe taps that she is able to self correct.  ° °Walking on uneven blue mat (with weights under) in //-bars.  CGA/min. A for safety with occasional UE assist on //-bars.   ° °Walking out to car with use of SPC and mod. A due to marked muscle fatigue in B LE at end of tx. Session.  °  ° °There.ex:   °  °Nustep L1 for 6 min. B UE/LE (no charge) ° °Seated bicep curls 3#/ shoulder flexion 1#10x ° °Standing hip/LE ex program (no ankle wts)- 20x each in //-bars.   °  ° ° ° ° ° PT Long Term Goals - 06/08/21 0928   ° °  ° PT LONG TERM GOAL #1  ° Title Pt will improve MMT scores by 1/2 grade to promote functional strength gains for transfers, amb, and decreased caregiver burden.   ° Baseline 4+/4 Hip flexion  4/4-  Hip abduction  4+/4  Hip adduction  4+/4  Knee extension  4/4 Knee flexion 4/4 Ankle dorsiflexion  4/4 Ankle plantarflexion.  2/21:  B LE muscle strength (R/L): knee extension (4+/4+), knee flexion (4+/4), hip flexion (4+/4+), hip abduction (4+/4+), hip adduction (5/5), DF (4/4+).   ° Time 4   ° Period Weeks   ° Status Partially Met   ° Target Date 07/05/21   °  ° PT LONG TERM GOAL #2  ° Title Pt will improve BERG score by 5 pts to promote safety with ambulation and functional tasks.   ° Baseline BERG: 18/56.  11/29: 40/56.  2/21: 39/56   ° Time 4   ° Period Weeks   ° Status Partially Met   ° Target Date 07/05/21   °  ° PT LONG TERM GOAL #3  ° Title Pt will improve FOTO score to predicted improvement value of 56 to measure self reported  improvement in functional mobility.   ° Baseline Initial eval: 36.   11/7: 54 (decrease in back pain but limitations in safe ambulation).  12/28: 68 (goal met)   ° Time 4   ° Period Weeks   ° Status Achieved   ° Target Date 04/13/21   °  °   PT LONG TERM GOAL #4  ° Title Pt will demonstrates safety with ambulation using FWW with no LOB and only occ verbal cueing and SBA with 10 minutes of overground walking.   ° Baseline Pt amb 1 min with extensive verbal cueing CGA-min A.  1/24: pt. using SPC occasionally and resistant to use of RW   ° Time 4   ° Period Weeks   ° Status Partially Met   ° Target Date 07/05/21   °  ° PT LONG TERM GOAL #5  ° Title Pt will decrease worse pain to 7/10 or less to promote greater access to functional mobility.   ° Baseline Present 7/10, Best 4/10, Worst 10/10.  11/29: <3/10 back pain.  2/21: 8/10 L LE pain at worse, best 0/10 and currently 5/10.   ° Time 4   ° Period Weeks   ° Status Partially Met   ° Target Date 07/05/21   °  ° PT LONG TERM GOAL #6  ° Title Pt. will report no falls consistently for 2 weeks with all aspects of walking at home/ in community with LRAD to improve safety/ mobility.   ° Baseline Pt. using SPC currently and has had a couple recent falls (no injury).  Pt. will benefit from rollator   ° Time 4   ° Period Weeks   ° Status Partially Met   ° Target Date 07/05/21   ° °  °  ° °  ° ° ° ° ° Plan - 06/16/21 1345   ° ° Clinical Impression Statement PT remains concerned about pts. fall risk/ antalgic gait without use of assistive device.  Pt. easily fatigued during dynamic balance tasks/ ther.ex.  L LE muscle fatigue and several episodes of scissoring gait during agility ladder walking/ turning.  CGA/ min. A with all walking activities in clinic.  No change to HEP and pt. instructed to ambulate with assist when not using SPC/ RW.   ° Personal Factors and Comorbidities Comorbidity 3+;Age;Transportation;Fitness   ° Comorbidities Hx cancer, previous back surgery, DM, vertigo,  Aortic atherosclerosis (HCC)   ° Examination-Activity Limitations Stairs;Reach Overhead;Stand;Dressing;Sit;Transfers;Lift;Caring for Others;Carry;Locomotion Level;Squat   ° Examination-Participation Restrictions Interpersonal Relationship;Cleaning;Laundry;Yard Work;Driving;Meal Prep;Shop   ° Stability/Clinical Decision Making Evolving/Moderate complexity   ° Clinical Decision Making Moderate   ° Rehab Potential Fair   ° PT Frequency 2x / week   ° PT Duration 4 weeks   ° PT Treatment/Interventions ADLs/Self Care Home Management;Aquatic Therapy;Neuromuscular re-education;Balance training;Therapeutic exercise;Therapeutic activities;Functional mobility training;Stair training;Gait training;DME Instruction;Patient/family education;Manual techniques;Energy conservation;Biofeedback;Electrical Stimulation;Moist Heat;Traction;Ultrasound;Dry needling;Passive range of motion;Vestibular;Vasopneumatic Device;Spinal Manipulations;Joint Manipulations   ° PT Next Visit Plan Dynamic balance/ gait activities.   Outside walking/ stairs.   ° Consulted and Agree with Plan of Care Patient   ° °  °  ° °  ° ° °Patient will benefit from skilled therapeutic intervention in order to improve the following deficits and impairments:  Abnormal gait, Decreased endurance, Decreased activity tolerance, Decreased strength, Pain, Difficulty walking, Decreased mobility, Decreased balance, Decreased range of motion, Improper body mechanics, Postural dysfunction, Decreased safety awareness, Decreased coordination, Decreased knowledge of precautions, Decreased knowledge of use of DME, Dizziness, Hypomobility, Impaired flexibility, Impaired sensation, Impaired perceived functional ability ° °Visit Diagnosis: °Bilateral leg weakness ° °At high risk for injury related to fall ° °Polio osteopathy of lower leg, left (HCC) ° ° ° ° °Problem List °Patient Active Problem List  ° Diagnosis Date Noted  ° Aortic atherosclerosis (HCC) 12/29/2020  ° Deformity of toe of    left foot 03/28/2020   H/O multiple pulmonary nodules 09/22/2019   Chronic cough 09/22/2019   Perennial allergic rhinitis 09/17/2019   Acute upper respiratory infection 08/28/2019   Weakness 08/28/2019   Acute bronchitis 06/01/2019   Cough productive of yellow sputum 06/01/2019   Vasomotor rhinitis 05/01/2019   Chronic kidney disease, stage II (mild) 03/14/2019   Gastroesophageal reflux disease without esophagitis 03/14/2019   Intractable episodic headache 03/14/2019   Need for prophylactic vaccination with combined diphtheria-tetanus-pertussis (DTaP) vaccine 03/14/2019   Renal cyst 11/27/2018   Abnormality of gait and mobility 11/27/2018   Hepatic steatosis 10/31/2018   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[place]) 10/31/2018   Elevated ferritin 10/31/2018   Vertigo 08/13/2018   Gastroenteritis 05/09/2018   Diarrhea 05/09/2018   Nausea 05/09/2018   Personal history of colon cancer    Polyp of sigmoid colon    Dysuria 02/23/2018   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 02/10/2018   Encounter for general adult medical examination with abnormal findings 02/10/2018   Acute non-recurrent pansinusitis 08/24/2017   Other headache syndrome 08/24/2017   Uncontrolled type 2 diabetes mellitus with hyperglycemia ([place]) 08/24/2017   Type 2 diabetes mellitus without complication, with long-term current use of insulin ([place]) 08/22/2017   Need for vaccination against Streptococcus pneumoniae using pneumococcal conjugate vaccine 13 08/22/2017   Essential hypertension 05/03/2017   Otalgia, bilateral 05/02/2017   Type 2 diabetes mellitus with hyperglycemia ([place]) 05/02/2017   Mixed hyperlipidemia 05/02/2017   Right lower quadrant pain 06/29/2016   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   History of colon cancer 04/13/2015   Cancer of ascending colon ([place]) 10/12/2014   Pura Spice, PT, DPT # [phone] 06/16/2021, 2:59 PM  [facility] [address]. [place], [place], [address] Phone: [phone]   Fax:  [phone]  Name: THRESEA DOBLE MRN: [id number] Date of Birth: [birth year]

## 2021-06-21 ENCOUNTER — Ambulatory Visit: Payer: Medicare HMO | Admitting: Physical Therapy

## 2021-06-21 ENCOUNTER — Other Ambulatory Visit: Payer: Self-pay

## 2021-06-21 ENCOUNTER — Encounter: Payer: Self-pay | Admitting: Physical Therapy

## 2021-06-21 DIAGNOSIS — M89662 Osteopathy after poliomyelitis, left lower leg: Secondary | ICD-10-CM

## 2021-06-21 DIAGNOSIS — M545 Low back pain, unspecified: Secondary | ICD-10-CM | POA: Diagnosis not present

## 2021-06-21 DIAGNOSIS — S3210XS Unspecified fracture of sacrum, sequela: Secondary | ICD-10-CM

## 2021-06-21 DIAGNOSIS — R29898 Other symptoms and signs involving the musculoskeletal system: Secondary | ICD-10-CM

## 2021-06-21 DIAGNOSIS — B91 Sequelae of poliomyelitis: Secondary | ICD-10-CM | POA: Diagnosis not present

## 2021-06-21 DIAGNOSIS — S322XXS Fracture of coccyx, sequela: Secondary | ICD-10-CM | POA: Diagnosis not present

## 2021-06-21 DIAGNOSIS — Z9181 History of falling: Secondary | ICD-10-CM

## 2021-06-21 NOTE — Therapy (Signed)
[facility] [address] [place], [place], [address] Phone: [phone]   Fax:  [phone]  Physical Therapy Treatment  Patient Details  Name: Tiffany Hawkins MRN: [id number] Date of Birth: 10/14/1945 Referring Provider (PT): Girtha Hake MD   Encounter Date: 06/21/2021   PT End of Session - 06/21/21 1344     Visit Number 42    Number of Visits 36    Date for PT Re-Evaluation 07/05/21    Authorization - Visit Number 2    Authorization - Number of Visits 10    Progress Note Due on Visit 10    PT Start Time 1340    PT Stop Time 1429    PT Time Calculation (min) 49 min    Equipment Utilized During Treatment Gait belt    Activity Tolerance Patient tolerated treatment well    Behavior During Therapy [facility] [employer] for tasks assessed/performed             Past Medical History:  Diagnosis Date   Anxiety    Colon cancer ([place]) 10/12/2014   Stage IIB; had chemo   Diabetes mellitus without complication ([place])    Hyperlipidemia    Hypertension    Lateral epicondylitis    Personal history of chemotherapy    Polio    childhood   Skin cancer    Sqamous Cell, Lt Calf   Sleep apnea    no CPAP, sx improved with wt loss    Past Surgical History:  Procedure Laterality Date   APPENDECTOMY     BACK SURGERY     CATARACT EXTRACTION W/PHACO Left 12/03/2017   Procedure: CATARACT EXTRACTION PHACO AND INTRAOCULAR LENS PLACEMENT ([place]) LEFT  DIABETIC;  Surgeon: Eulogio Bear, MD;  Location: [place];  Service: Ophthalmology;  Laterality: Left;  Diabetic - insulin   CATARACT EXTRACTION W/PHACO Right 01/14/2018   Procedure: CATARACT EXTRACTION PHACO AND INTRAOCULAR LENS PLACEMENT ([facility]) RIGHT DIABETIC;  Surgeon: Eulogio Bear, MD;  Location: [place];  Service: Ophthalmology;  Laterality: Right;  Diabetic - insulin   CERVICAL DISC SURGERY     CHOLECYSTECTOMY     COLON SURGERY     COLONOSCOPY     COLONOSCOPY WITH  PROPOFOL N/A 05/17/2015   Procedure: COLONOSCOPY WITH PROPOFOL;  Surgeon: Manya Silvas, MD;  Location: [facility] ENDOSCOPY;  Service: Endoscopy;  Laterality: N/A;   COLONOSCOPY WITH PROPOFOL N/A 04/01/2018   Procedure: COLONOSCOPY WITH BIOPSY;  Surgeon: [name], MD;  Location: [place];  Service: Endoscopy;  Laterality: N/A;  Diabetic - insulin   EMBOLIZATION N/A 01/24/2021   Procedure: EMBOLIZATION;  Surgeon: Algernon Huxley, MD;  Location: [place] CV LAB;  Service: Cardiovascular;  Laterality: N/A;   ESOPHAGOGASTRODUODENOSCOPY     HERNIA REPAIR     LOWER EXTREMITY VENOGRAPHY Left 01/24/2021   Procedure: LOWER EXTREMITY VENOGRAPHY;  Surgeon: Algernon Huxley, MD;  Location: [place] CV LAB;  Service: Cardiovascular;  Laterality: Left;   POLYPECTOMY N/A 04/01/2018   Procedure: POLYPECTOMY;  Surgeon: [name], MD;  Location: [place];  Service: Endoscopy;  Laterality: N/A;   PORT A CATH INJECTION ([place] [facility])     Port has been removed   TUBAL LIGATION      There were no vitals filed for this visit.   Subjective Assessment - 06/21/21 1343     Subjective Pt. states her CPAP headgear is messed up and not working well so she is not sleeping well.  Pt. states she is not walking well today.  Pt. reports she has raised the head of the bed which helps breathing but hurts her back.    Pertinent History per referring note: "Patient notes that she does have a history of chronic back pain which did worsen after her fall. She also endorses a history of polio. Over the past month she feels that she has been more off balance and has had more falls. She is also noticing worsening weakness in her legs. She rates her pain as an 8/10. Is intermittent, stiff. She denies any numbness, tingling or loss of control of bowel or bladder. " H/o Hx cancer, previous back surgery, DM, vertigo, Aortic atherosclerosis ([place]).    Limitations Walking;House hold activities;Sitting;Lifting;Standing     Diagnostic tests MRI of lumbar and cervical spine. See imaging history.    Patient Stated Goals Reduce pain and improve balance.    Currently in Pain? No/denies    Pain Onset More than a month ago             Neuro Re-Ed:     Walking in //-bars with recip. Cone taps (1 lap)- seated rest breaks due to fatigue reported.    Sit to stands from gray chair:  5x with 1 posterior LOB with min. A from PT to control/ return to stand.    Walking at agility ladder focusing on even stride length/ upright posture 3 laps.  Walking on agility ladder with alternating toe taps to cones/ picking up cones. 4x each. Pt. Requires CGA and has some LOB when performing toe taps that she is able to self correct.     Walking out to car with use of SPC and mod. A due to marked muscle fatigue in B LE at end of tx. Session.      There.ex:    Seated with upright posture: 3# wand shoulder flexion/ chest press 20x.    MH to low back in sitting during rest break.  Pt. Really fatigued today.  Seated bicep curls 1#/ alternating sh. Flexion with added hip flexion.     Nustep L2 for 12 min. B UE/LE (no charge)       PT Long Term Goals - 06/08/21 3295       PT LONG TERM GOAL #1   Title Pt will improve MMT scores by 1/2 grade to promote functional strength gains for transfers, amb, and decreased caregiver burden.    Baseline 4+/4 Hip flexion  4/4-  Hip abduction  4+/4  Hip adduction  4+/4  Knee extension  4/4 Knee flexion 4/4 Ankle dorsiflexion  4/4 Ankle plantarflexion.  2/21:  B LE muscle strength (R/L): knee extension (4+/4+), knee flexion (4+/4), hip flexion (4+/4+), hip abduction (4+/4+), hip adduction (5/5), DF (4/4+).    Time 4    Period Weeks    Status Partially Met    Target Date 07/05/21      PT LONG TERM GOAL #2   Title Pt will improve BERG score by 5 pts to promote safety with ambulation and functional tasks.    Baseline BERG: 18/56.  11/29: 40/56.  2/21: 39/56    Time 4    Period Weeks     Status Partially Met    Target Date 07/05/21      PT LONG TERM GOAL #3   Title Pt will improve FOTO score to predicted improvement value of 56 to measure self reported improvement in functional mobility.    Baseline Initial eval: 36.  11/7: 54 (decrease in back pain but limitations in safe ambulation).  12/28: 68 (goal met)    Time 4    Period Weeks    Status Achieved    Target Date 04/13/21      PT LONG TERM GOAL #4   Title Pt will demonstrates safety with ambulation using FWW with no LOB and only occ verbal cueing and SBA with 10 minutes of overground walking.    Baseline Pt amb 1 min with extensive verbal cueing CGA-min A.  1/24: pt. using SPC occasionally and resistant to use of RW    Time 4    Period Weeks    Status Partially Met    Target Date 07/05/21      PT LONG TERM GOAL #5   Title Pt will decrease worse pain to 7/10 or less to promote greater access to functional mobility.    Baseline Present 7/10, Best 4/10, Worst 10/10.  11/29: <3/10 back pain.  2/21: 8/10 L LE pain at worse, best 0/10 and currently 5/10.    Time 4    Period Weeks    Status Partially Met    Target Date 07/05/21      PT LONG TERM GOAL #6   Title Pt. will report no falls consistently for 2 weeks with all aspects of walking at home/ in community with LRAD to improve safety/ mobility.    Baseline Pt. using SPC currently and has had a couple recent falls (no injury).  Pt. will benefit from rollator    Time 4    Period Weeks    Status Partially Met    Target Date 07/05/21                   Plan - 06/21/21 1625     Clinical Impression Statement Tx. progression limited by moderate generalized fatigue/ L LE muscle weakness today.  Pt. arrived to PT with c/o fatigue secondary to not sleeping well.  No falls since last PT tx. session but pt. states she has been wobbly while walking around.  Pt. did not attend church on Sunday.  Several seated rest breaks required during tx. session with focus on  balance/ proprioception tasks/ education in transfers.  Pt. had 1 episode of posterior LOB during sit to stands from gray chair requiring PT assist to return to upright posture.    Personal Factors and Comorbidities Comorbidity 3+;Age;Transportation;Fitness    Comorbidities Hx cancer, previous back surgery, DM, vertigo, Aortic atherosclerosis ([place])    Examination-Activity Limitations Stairs;Reach Overhead;Stand;Dressing;Sit;Transfers;Lift;Caring for Others;Carry;Locomotion Level;Squat    Examination-Participation Restrictions Interpersonal Relationship;Cleaning;Laundry;Yard Work;Driving;Meal Prep;Shop    Stability/Clinical Decision Making Evolving/Moderate complexity    Clinical Decision Making Moderate    Rehab Potential Fair    PT Frequency 2x / week    PT Duration 4 weeks    PT Treatment/Interventions ADLs/Self Care Home Management;Aquatic Therapy;Neuromuscular re-education;Balance training;Therapeutic exercise;Therapeutic activities;Functional mobility training;Stair training;Gait training;DME Instruction;Patient/family education;Manual techniques;Energy conservation;Biofeedback;Electrical Stimulation;Moist Heat;Traction;Ultrasound;Dry needling;Passive range of motion;Vestibular;Vasopneumatic Device;Spinal Manipulations;Joint Manipulations    PT Next Visit Plan Dynamic balance/ gait activities.   Outside walking/ stairs.    Consulted and Agree with Plan of Care Patient             Patient will benefit from skilled therapeutic intervention in order to improve the following deficits and impairments:  Abnormal gait, Decreased endurance, Decreased activity tolerance, Decreased strength, Pain, Difficulty walking, Decreased mobility, Decreased balance, Decreased range of motion, Improper body mechanics, Postural dysfunction, Decreased safety awareness, Decreased coordination,  Decreased knowledge of precautions, Decreased knowledge of use of DME, Dizziness, Hypomobility, Impaired flexibility,  Impaired sensation, Impaired perceived functional ability  Visit Diagnosis: Bilateral leg weakness  At high risk for injury related to fall  Polio osteopathy of lower leg, left (HCC)  Bilateral low back pain, unspecified chronicity, unspecified whether sciatica present  Closed fracture of sacrum and coccyx, sequela     Problem List Patient Active Problem List   Diagnosis Date Noted   Aortic atherosclerosis ([place]) 12/29/2020   Deformity of toe of left foot 03/28/2020   H/O multiple pulmonary nodules 09/22/2019   Chronic cough 09/22/2019   Perennial allergic rhinitis 09/17/2019   Acute upper respiratory infection 08/28/2019   Weakness 08/28/2019   Acute bronchitis 06/01/2019   Cough productive of yellow sputum 06/01/2019   Vasomotor rhinitis 05/01/2019   Chronic kidney disease, stage II (mild) 03/14/2019   Gastroesophageal reflux disease without esophagitis 03/14/2019   Intractable episodic headache 03/14/2019   Need for prophylactic vaccination with combined diphtheria-tetanus-pertussis (DTaP) vaccine 03/14/2019   Renal cyst 11/27/2018   Abnormality of gait and mobility 11/27/2018   Hepatic steatosis 10/31/2018   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[place]) 10/31/2018   Elevated ferritin 10/31/2018   Vertigo 08/13/2018   Gastroenteritis 05/09/2018   Diarrhea 05/09/2018   Nausea 05/09/2018   Personal history of colon cancer    Polyp of sigmoid colon    Dysuria 02/23/2018   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 02/10/2018   Encounter for general adult medical examination with abnormal findings 02/10/2018   Acute non-recurrent pansinusitis 08/24/2017   Other headache syndrome 08/24/2017   Uncontrolled type 2 diabetes mellitus with hyperglycemia ([place]) 08/24/2017   Type 2 diabetes mellitus without complication, with long-term current use of insulin ([place]) 08/22/2017   Need for vaccination against Streptococcus pneumoniae using pneumococcal conjugate vaccine 13  08/22/2017   Essential hypertension 05/03/2017   Otalgia, bilateral 05/02/2017   Type 2 diabetes mellitus with hyperglycemia ([place]) 05/02/2017   Mixed hyperlipidemia 05/02/2017   Right lower quadrant pain 06/29/2016   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   History of colon cancer 04/13/2015   Cancer of ascending colon ([place]) 10/12/2014   Pura Spice, PT, DPT # [phone] 06/21/2021, 4:28 PM  [facility] [address]. [place], [place], [address] Phone: [phone]   Fax:  [phone]  Name: ALANIA OVERHOLT MRN: [id number] Date of Birth: July 13, 1945

## 2021-06-22 ENCOUNTER — Telehealth: Payer: Medicare HMO

## 2021-06-22 ENCOUNTER — Ambulatory Visit: Payer: Medicare HMO | Admitting: Student-PharmD

## 2021-06-22 DIAGNOSIS — E119 Type 2 diabetes mellitus without complications: Secondary | ICD-10-CM

## 2021-06-22 DIAGNOSIS — E782 Mixed hyperlipidemia: Secondary | ICD-10-CM

## 2021-06-22 DIAGNOSIS — Z794 Long term (current) use of insulin: Secondary | ICD-10-CM

## 2021-06-22 NOTE — Progress Notes (Cosign Needed)
Follow Up Pharmacist Visit  Tiffany Hawkins, SLEDD [id number] 11 years, Female  DOB: 1945-07-07  M: [id number]  Clinical Summary Patient Risk: Moderate Next CCM Follow Up: 12/21/21 Next AWV: 02/24/22 Next PCP Visit: 08/03/21 Summary for PCP: Patient reports she has continued to have high sugar readings (fasting range is from 168-197). Was recently approved for PAP for Novolog Mix 70/30 and will start once she receives. Has been using Novolin 70/30 for cost purposes. Counseled patient on a low-carb, low-fat diet as well. LDL is not at goal (is >70). If LDL does not improve, may need to increase lovastatin dose or switch to higher-intensity statin.  Patient Chart Prep  Completed by Charlann Lange on 06/20/2021  Chronic Conditions Patient's Chronic Conditions: Hypertension (HTN), Diabetes (DM), Hyperlipidemia/Dyslipidemia (HLD)  Doctor and Hospital Visits Were there PCP Visits since last visit with the Pharmacist?: No Were there Specialist Visits since last visit with the Pharmacist?: No Was there a Hospital Visit in last 30 days?: No Were there other Hospital Visits since last visit with the Pharmacist?: No  Medication Information Have there been any medication changes from PCP or Specialist since last visit with the Pharmacist?: No Are there any Medication adherence gaps (beyond 5 days past due)?: No Medication adherence rates for the STAR rating drugs: Lovastatin 20 mg 05/26/21 90 DS Farxiga 5 mg PAP List Patient's current Care Gaps: No current Care Gaps identified  Pre-Call Questions  Completed by Charlann Lange on 06/20/2021  Are you able to connect with Patient: Yes Confirmed appointment date/time with patient/caregiver?: Yes Date/time of the appointment: 06/22/21 at 2:15 PM [phone]  Visit type: Phone Patient/Caregiver instructed to bring medications to appointment: Yes What, if any, problems do you have getting your medications from the pharmacy?: None What is your top  health concern to discuss at your upcoming visit?: Patient stated she is waiting for her NOVOLog update because she is almost out of her medication that she has at this time. Have you seen any other providers since your last visit?: Yes Details: Patient has been doing physical therapy.  Disease Assessments  Subjective Information Current BP: 127/73 Current HR: 81 taken on: 05/04/2021 Weight: 154 BMI: 28.24 Last GFR: 35 taken on: 05/03/2021 Visit Completed on: 06/22/2021 Why did the patient present?: CCM F/U Visit Factors that may affect medication adherence?: Pill burden Is Patient using [facility]?: No Name and location of Current pharmacy: [facility] Current Rx insurance plan: [employer] Are meds synced by current pharmacy?: No Are meds delivered by current pharmacy?: Yes - by mail order pharmacy Would patient benefit from direct intervention of clinical lead in dispensing process to optimize clinical outcomes?: Yes Are [facility] pharmacy services available where patient lives?: Yes Is patient disadvantaged to use [facility]?: Yes Does patient experience delays in picking up medications due to transportation concerns (getting to pharmacy)?: No Any additional demeanor/mood notes?: Patient presents via telephone and is very pleasant to speak with.  Hyperlipidemia/Dyslipidemia (HLD) Last Lipid panel on: 05/03/2021 TC (Goal<200): 150 LDL: 84 HDL (Goal>40): 32 TG (Goal<150): 200 ASCVD 10-year risk?is:: High (>20%) ASCVD Risk Score: 51.8 % Assess this condition today?: Yes LDL Goal: <70 Has patient tried and failed any HLD Medications?: No Check present secondary causes (below) that can lead to increased cholesterol levels (multi-choice optional): CKD, Diabetes We discussed: How to reduce cholesterol through diet/weight management and physical activity., How a diet high in plant sterols (fruits/vegetables/nuts/whole grains/legumes) may reduce your  cholesterol. Assessment:: Uncontrolled Drug: Lovastatin '20mg'$  at bedtime Assessment:  Appropriate, Effective, Safe, Accessible Drug: Fenofibrate '54mg'$  1 tab daily Assessment: Appropriate, Effective, Safe, Accessible Additional Info: Discussed diet changes and importance of exercise to patient. If ldl does not improve, may need to increase dose of lovastatin or switch to a higher intensity statin. Plan to: Continue medication therapy HC Follow up: 2, 4 months Pharmacist Follow up: 6 months  Diabetes (DM) Current A1C: 7.5 taken on: 05/04/2021 Type: 2 Last DM Foot Exam on: 10/31/2020 Last DM Eye Exam on: 09/16/2020 Assess this condition today?: Yes Goal A1C: < 7.0 % Type: 2 Is Patient taking statin medication: Yes Is patient taking ACEi / ARB?: No Reason patient is not taking ACEi / ARB: cough Patient has tried and failed: lisinopril, valsartan Does Patient use RPM device?: No DM RPM device: Does patient qualify?: No Patient checking Blood Sugar at home?: Yes Does patient use a Continuous Glucose Monitoring (CGM) device?: No How often does patient check Blood Sugars?: once to twice daily Recent fasting Blood sugar reading: 168-197 Recent post-meal Blood sugar reading: 193 today on during the visit Has patient experienced any hypoglycemic episodes?: No Diet recall discussed: Yes Breakfast: egg and bacon/sausage and a piece of white toast Lunch: sandwich, burger Supper: varies, chicken salad sandwich last night Beverages: water We discussed: Low carbohydrate eating plan with an emphasis on whole grains, legumes, nuts, fruits, and vegetables and minimal refined and processed foods., Modifying lifestyle, including to participate in moderate physical activity (e.g., walking) at least 150 minutes per week. Assessment:: Uncontrolled Drug: Novolin 70/30 35 units in the morning and 40 units at night Assessment: Appropriate, Effective, Safe, Accessible Drug: Farxiga '10mg'$  1 tab  daily Assessment: Appropriate, Effective, Safe, Accessible Additional Info: Patient has tried and failed: ozempic, metformin, januvia, glipizide and victoza (stomach issues with all). Patient is currently using Novolog 70/30 vials that she buys from [place] [facility] on their $25 plan. Was recently approved for PAP on Novolog Mix 70/30 pen. Will be interested to see if this impacts sugars differently once starting. Counseled patient on diet changes as well. Plan to: Continue medication therapy HC Follow up: 2, 4 months Pharmacist Follow up: 6 months  Exercise, Diet and Non-Drug Coordination Needs Additional exercise counseling points. We discussed: targeting at least 151 minutes per week of moderate-intensity aerobic exercise. Additional diet counseling points. We discussed: key components of a low-carb eating plan Discussed Non-Drug Care Coordination Needs: Yes Does Patient have Medication financial barriers?: Yes Patient has insurance  Designer, fashion/clothing): [employer] and has trouble affording medications  (list number): Novolog Mix Goal: (pharmacist to fill in plan to resolve) i.e. change medications, samples, PAP.: patient recently approved for PAP, waiting to receive Patient meets income/out of pocket spend criteria for this medications patient assistance program. Reviewed application process, patient will provide proof of income, out of pocket spend report, and will sign application.  Will collaborate with PCP, and will submit for patient.: Yes  [facility] Health-Related Social Needs Screening Tool -  SDOH  What is your living situation today? (ref #1): I have a steady place to live Think about the place you live. Do you have problems with any of the following? (ref #2): None of the above Within the past 12 months, you worried that your food would run out before you got money to buy more (ref #3): Never true Within the past 12 months, the food you bought just didn't last and you  didn't have money to get more (ref #4): Never true In the past 12 months, has  lack of reliable transportation kept you from medical appointments, meetings, work or from getting things needed for daily living? (ref #5): No In the past 12 months, has the electric, gas, oil, or water company threatened to shut off services in your home? (ref #6): No How often does anyone, including family and friends, physically hurt you? (ref #7): Never (1) How often does anyone, including family and friends, insult or talk down to you? (ref #8): Never (1) How often does anyone, including friends and family, threaten you with harm? (ref #9): Never (1) How often does anyone, including family and friends, scream or curse at you? (ref #10): Never (1)      Engagement Notes HC Chart Review: 15 min 06/20/21 HC Assessment call time spent: 5 min 06/20/21 CP Chart Prep: 17 min 06/20/21 CP Office Visit: 47 min 06/22/21 CP Office Visit Documentation: 28 min 06/22/21  Alena Bills Clinical Pharmacist

## 2021-06-23 ENCOUNTER — Ambulatory Visit: Payer: Medicare HMO | Admitting: Physical Therapy

## 2021-06-23 ENCOUNTER — Other Ambulatory Visit: Payer: Self-pay

## 2021-06-23 DIAGNOSIS — S3210XS Unspecified fracture of sacrum, sequela: Secondary | ICD-10-CM

## 2021-06-23 DIAGNOSIS — Z9181 History of falling: Secondary | ICD-10-CM

## 2021-06-23 DIAGNOSIS — B91 Sequelae of poliomyelitis: Secondary | ICD-10-CM | POA: Diagnosis not present

## 2021-06-23 DIAGNOSIS — M545 Low back pain, unspecified: Secondary | ICD-10-CM

## 2021-06-23 DIAGNOSIS — M89662 Osteopathy after poliomyelitis, left lower leg: Secondary | ICD-10-CM

## 2021-06-23 DIAGNOSIS — R29898 Other symptoms and signs involving the musculoskeletal system: Secondary | ICD-10-CM | POA: Diagnosis not present

## 2021-06-23 DIAGNOSIS — S322XXS Fracture of coccyx, sequela: Secondary | ICD-10-CM | POA: Diagnosis not present

## 2021-06-23 NOTE — Therapy (Signed)
[place] [facility] [address]. [place], [place], [address] Phone: [phone]   Fax:  [phone]  Physical Therapy Treatment  Patient Details  Name: Tiffany Hawkins MRN: [id number] Date of Birth: Feb 28, 1946 Referring Provider (PT): Girtha Hake MD   Encounter Date: 06/23/2021   PT End of Session - 06/23/21 1344     Visit Number 43    Number of Visits 49    Date for PT Re-Evaluation 07/05/21    Authorization - Visit Number 3    Authorization - Number of Visits 10    Progress Note Due on Visit 10    PT Start Time 1340    PT Stop Time 1431    PT Time Calculation (min) 51 min    Equipment Utilized During Treatment Gait belt    Activity Tolerance Patient tolerated treatment well    Behavior During Therapy [facility] for tasks assessed/performed             Past Medical History:  Diagnosis Date   Anxiety    Colon cancer ([place]) 10/12/2014   Stage IIB; had chemo   Diabetes mellitus without complication ([place])    Hyperlipidemia    Hypertension    Lateral epicondylitis    Personal history of chemotherapy    Polio    childhood   Skin cancer    Sqamous Cell, Lt Calf   Sleep apnea    no CPAP, sx improved with wt loss    Past Surgical History:  Procedure Laterality Date   APPENDECTOMY     BACK SURGERY     CATARACT EXTRACTION W/PHACO Left 12/03/2017   Procedure: CATARACT EXTRACTION PHACO AND INTRAOCULAR LENS PLACEMENT ([place]) LEFT  DIABETIC;  Surgeon: Eulogio Bear, MD;  Location: [place];  Service: Ophthalmology;  Laterality: Left;  Diabetic - insulin   CATARACT EXTRACTION W/PHACO Right 01/14/2018   Procedure: CATARACT EXTRACTION PHACO AND INTRAOCULAR LENS PLACEMENT ([place]) RIGHT DIABETIC;  Surgeon: Eulogio Bear, MD;  Location: [place];  Service: Ophthalmology;  Laterality: Right;  Diabetic - insulin   CERVICAL DISC SURGERY     CHOLECYSTECTOMY     COLON SURGERY     COLONOSCOPY     COLONOSCOPY WITH  PROPOFOL N/A 05/17/2015   Procedure: COLONOSCOPY WITH PROPOFOL;  Surgeon: Manya Silvas, MD;  [facility]: [facility] ENDOSCOPY;  Service: Endoscopy;  Laterality: N/A;   COLONOSCOPY WITH PROPOFOL N/A 04/01/2018   Procedure: COLONOSCOPY WITH BIOPSY;  Surgeon: [name], MD;  Location: [place];  Service: Endoscopy;  Laterality: N/A;  Diabetic - insulin   EMBOLIZATION N/A 01/24/2021   Procedure: EMBOLIZATION;  Surgeon: Algernon Huxley, MD;  Location: [place] CV LAB;  Service: Cardiovascular;  Laterality: N/A;   ESOPHAGOGASTRODUODENOSCOPY     HERNIA REPAIR     LOWER EXTREMITY VENOGRAPHY Left 01/24/2021   Procedure: LOWER EXTREMITY VENOGRAPHY;  Surgeon: Algernon Huxley, MD;  Location: [place] CV LAB;  Service: Cardiovascular;  Laterality: Left;   POLYPECTOMY N/A 04/01/2018   Procedure: POLYPECTOMY;  Surgeon: [name], MD;  Location: [place];  Service: Endoscopy;  Laterality: N/A;   PORT A CATH INJECTION ([place] HX)     Port has been removed   TUBAL LIGATION      There were no vitals filed for this visit.   Subjective Assessment - 06/23/21 1343     Subjective Pt. got CPAP fixed and slept well last night.  Pt. states her blood sugar has been fluctuating  and was elevated this morning.  Pt. has been instructed by MD to lower sugar intake.  No falls since last tx. session.  Pt. entered PT with use of SPC.    Pertinent History per referring note: "Patient notes that she does have a history of chronic back pain which did worsen after her fall. She also endorses a history of polio. Over the past month she feels that she has been more off balance and has had more falls. She is also noticing worsening weakness in her legs. She rates her pain as an 8/10. Is intermittent, stiff. She denies any numbness, tingling or loss of control of bowel or bladder. " H/o Hx cancer, previous back surgery, DM, vertigo, Aortic atherosclerosis ([place]).    Limitations Walking;House hold  activities;Sitting;Lifting;Standing    Diagnostic tests MRI of lumbar and cervical spine. See imaging history.    Patient Stated Goals Reduce pain and improve balance.    Currently in Pain? No/denies    Pain Onset More than a month ago             Neuro Re-Ed:     Walking at agility ladder focusing on even stride length/ upright posture 2 laps x 2.  Lateral walking at agility ladder L/R with CGA for safety secondary 2 posterior LOB with extra time to correct.   Walking in //-bars with recip. Step pattern/ high marching/ alt. UE and LE touches 3 laps.    Sit to stands from gray chair: 6x with 2 posterior LOB with min. A from PT on 4th and 5th attempt to control/ return to stand.     Recip. Stair climbing with R UE assist 5x 4 steps with SBA for safety.  No posterior leaning.    Tandem gait in //-bars with light to no UE assist 3 laps.    Walking out to car with use of SPC and mod. A due to marked muscle fatigue in B LE at end of tx. Session.      There.ex:    Nustep L3 for 10 min. B UE/LE (no charge)    Seated marching/ LAQ/ standing hip extension/ abduction 20x (no ankle wts.).        PT Long Term Goals - 06/08/21 9622       PT LONG TERM GOAL #1   Title Pt will improve MMT scores by 1/2 grade to promote functional strength gains for transfers, amb, and decreased caregiver burden.    Baseline 4+/4 Hip flexion  4/4-  Hip abduction  4+/4  Hip adduction  4+/4  Knee extension  4/4 Knee flexion 4/4 Ankle dorsiflexion  4/4 Ankle plantarflexion.  2/21:  B LE muscle strength (R/L): knee extension (4+/4+), knee flexion (4+/4), hip flexion (4+/4+), hip abduction (4+/4+), hip adduction (5/5), DF (4/4+).    Time 4    Period Weeks    Status Partially Met    Target Date 07/05/21      PT LONG TERM GOAL #2   Title Pt will improve BERG score by 5 pts to promote safety with ambulation and functional tasks.    Baseline BERG: 18/56.  11/29: 40/56.  2/21: 39/56    Time 4    Period Weeks     Status Partially Met    Target Date 07/05/21      PT LONG TERM GOAL #3   Title Pt will improve FOTO score to predicted improvement value of 56 to measure self reported improvement in functional mobility.    Baseline  Initial eval: 36.   11/7: 54 (decrease in back pain but limitations in safe ambulation).  12/28: 68 (goal met)    Time 4    Period Weeks    Status Achieved    Target Date 04/13/21      PT LONG TERM GOAL #4   Title Pt will demonstrates safety with ambulation using FWW with no LOB and only occ verbal cueing and SBA with 10 minutes of overground walking.    Baseline Pt amb 1 min with extensive verbal cueing CGA-min A.  1/24: pt. using SPC occasionally and resistant to use of RW    Time 4    Period Weeks    Status Partially Met    Target Date 07/05/21      PT LONG TERM GOAL #5   Title Pt will decrease worse pain to 7/10 or less to promote greater access to functional mobility.    Baseline Present 7/10, Best 4/10, Worst 10/10.  11/29: <3/10 back pain.  2/21: 8/10 L LE pain at worse, best 0/10 and currently 5/10.    Time 4    Period Weeks    Status Partially Met    Target Date 07/05/21      PT LONG TERM GOAL #6   Title Pt. will report no falls consistently for 2 weeks with all aspects of walking at home/ in community with LRAD to improve safety/ mobility.    Baseline Pt. using SPC currently and has had a couple recent falls (no injury).  Pt. will benefit from rollator    Time 4    Period Weeks    Status Partially Met    Target Date 07/05/21                   Plan - 06/23/21 1344     Clinical Impression Statement Pt. had no falls since last tx. session and c/o no pain during the treatment session.  Pt. has several posterior LOB during sit to stands/ transfers and requires min. A from PT to return to stand. Pt. is educated on the importance of using a SPC for all community and household ambulation for safety as she tends to ambulate with no AD, putting herself  at a high fall risk. Pt. will continue to work on balance and ambulation on uneven terrain with AD so that she is able to safely perform ADLs and reduce her fall risk.    Personal Factors and Comorbidities Comorbidity 3+;Age;Transportation;Fitness    Comorbidities Hx cancer, previous back surgery, DM, vertigo, Aortic atherosclerosis ([place])    Examination-Activity Limitations Stairs;Reach Overhead;Stand;Dressing;Sit;Transfers;Lift;Caring for Others;Carry;Locomotion Level;Squat    Examination-Participation Restrictions Interpersonal Relationship;Cleaning;Laundry;Yard Work;Driving;Meal Prep;Shop    Stability/Clinical Decision Making Evolving/Moderate complexity    Clinical Decision Making Moderate    Rehab Potential Fair    PT Frequency 2x / week    PT Duration 4 weeks    PT Treatment/Interventions ADLs/Self Care Home Management;Aquatic Therapy;Neuromuscular re-education;Balance training;Therapeutic exercise;Therapeutic activities;Functional mobility training;Stair training;Gait training;DME Instruction;Patient/family education;Manual techniques;Energy conservation;Biofeedback;Electrical Stimulation;Moist Heat;Traction;Ultrasound;Dry needling;Passive range of motion;Vestibular;Vasopneumatic Device;Spinal Manipulations;Joint Manipulations    PT Next Visit Plan Dynamic balance/ gait activities.  Pt. has 2 more authorized tx. session before 3/16    Consulted and Agree with Plan of Care Patient             Patient will benefit from skilled therapeutic intervention in order to improve the following deficits and impairments:  Abnormal gait, Decreased endurance, Decreased activity tolerance, Decreased strength, Pain, Difficulty walking, Decreased mobility,  Decreased balance, Decreased range of motion, Improper body mechanics, Postural dysfunction, Decreased safety awareness, Decreased coordination, Decreased knowledge of precautions, Decreased knowledge of use of DME, Dizziness, Hypomobility, Impaired  flexibility, Impaired sensation, Impaired perceived functional ability  Visit Diagnosis: Bilateral leg weakness  At high risk for injury related to fall  Polio osteopathy of lower leg, left (HCC)  Bilateral low back pain, unspecified chronicity, unspecified whether sciatica present  Closed fracture of sacrum and coccyx, sequela     Problem List Patient Active Problem List   Diagnosis Date Noted   Aortic atherosclerosis ([place]) 12/29/2020   Deformity of toe of left foot 03/28/2020   H/O multiple pulmonary nodules 09/22/2019   Chronic cough 09/22/2019   Perennial allergic rhinitis 09/17/2019   Acute upper respiratory infection 08/28/2019   Weakness 08/28/2019   Acute bronchitis 06/01/2019   Cough productive of yellow sputum 06/01/2019   Vasomotor rhinitis 05/01/2019   Chronic kidney disease, stage II (mild) 03/14/2019   Gastroesophageal reflux disease without esophagitis 03/14/2019   Intractable episodic headache 03/14/2019   Need for prophylactic vaccination with combined diphtheria-tetanus-pertussis (DTaP) vaccine 03/14/2019   Renal cyst 11/27/2018   Abnormality of gait and mobility 11/27/2018   Hepatic steatosis 10/31/2018   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[place]) 10/31/2018   Elevated ferritin 10/31/2018   Vertigo 08/13/2018   Gastroenteritis 05/09/2018   Diarrhea 05/09/2018   Nausea 05/09/2018   Personal history of colon cancer    Polyp of sigmoid colon    Dysuria 02/23/2018   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 02/10/2018   Encounter for general adult medical examination with abnormal findings 02/10/2018   Acute non-recurrent pansinusitis 08/24/2017   Other headache syndrome 08/24/2017   Uncontrolled type 2 diabetes mellitus with hyperglycemia ([place]) 08/24/2017   Type 2 diabetes mellitus without complication, with long-term current use of insulin ([place]) 08/22/2017   Need for vaccination against Streptococcus pneumoniae using pneumococcal conjugate  vaccine 13 08/22/2017   Essential hypertension 05/03/2017   Otalgia, bilateral 05/02/2017   Type 2 diabetes mellitus with hyperglycemia ([place]) 05/02/2017   Mixed hyperlipidemia 05/02/2017   Right lower quadrant pain 06/29/2016   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 [place], [place], [address] Phone: [phone]   Fax:  [phone]  Name: TEREA NEUBAUER MRN: [id number] Date of Birth: [birth year]

## 2021-06-24 ENCOUNTER — Telehealth: Payer: Self-pay | Admitting: Student-PharmD

## 2021-06-24 ENCOUNTER — Telehealth: Payer: Self-pay

## 2021-06-24 NOTE — Progress Notes (Signed)
?  Chronic Care Management ?Pharmacy Assistant  ? ?Name: KAROLINA ZAMOR  MRN: [id number] DOB: [birth year] ? ?Reason for Encounter: Care Plan and Patient Handout ? ?Medications: ?Outpatient Encounter Medications as of 06/24/2021  ?Medication Sig  ? albuterol (VENTOLIN HFA) 108 (90 Base) MCG/ACT inhaler Inhale 2 puffs into the lungs every 6 (six) hours as needed for wheezing or shortness of breath.  ? amLODipine (NORVASC) 5 MG tablet TAKE 2 TABLETS DAILY.  ? Blood Glucose Monitoring Suppl (TRUE METRIX METER) w/Device KIT USE AS DIRECTED  ? cetirizine (ZYRTEC) 10 MG chewable tablet Chew 1 tablet (10 mg total) by mouth daily.  ? chlorpheniramine-HYDROcodone (TUSSIONEX PENNKINETIC ER) 10-8 MG/5ML SUER Take 5 mLs by mouth at bedtime as needed for cough.  ? citalopram (CELEXA) 20 MG tablet TAKE 2 TABLETS DAILY FOR DEPRESSION.  ? Dapagliflozin Propanediol (FARXIGA PO) Take by mouth.  ? fenofibrate 54 MG tablet Take 1 tablet (54 mg total) by mouth daily.  ? fluticasone (FLONASE) 50 MCG/ACT nasal spray Place 2 sprays into both nostrils daily.  ? glucose blood (TRUE METRIX BLOOD GLUCOSE TEST) test strip TEST BLOOD SUGAR THREE TIMES DAILY AFTER MEALS  ? ibuprofen (ADVIL) 800 MG tablet Take 1 tablet (800 mg total) by mouth every 8 (eight) hours as needed.  ? insulin aspart protamine - aspart (NOVOLOG MIX 70/30 FLEXPEN) (70-30) 100 UNIT/ML FlexPen Inject 0.35 mLs (35 Units total) into the skin 2 (two) times daily. Inject 35 units [place] QAM. Gradually increase to 40units [place] QPM  ? lovastatin (MEVACOR) 20 MG tablet TAKE 1 TABLET AT BEDTIME FOR HIGH CHOLESTEROL  ? Multiple Vitamin (MULTIVITAMIN) tablet Take by mouth daily. TAKES 1/2 TABLET  ? pantoprazole (PROTONIX) 20 MG tablet TAKE 2 TABLETS EVERY DAY  ? sharps container 1 each by Does not apply route as needed. To use for insulin syringes and needles. Taking insulin at least twice daily.  E11.65  ? spironolactone (ALDACTONE) 100 MG tablet Take 100 mg by mouth daily. 2 tablets once a day   ? TRUEplus Lancets 33G MISC Use as directed three times a day E11.65  ? ?No facility-administered encounter medications on file as of 06/24/2021.  ? ?Reviewed the patients visit reinsured it was completed per the pharmacist Alena Bills request. Printed the CCM care plan. Mailed the patient CCM care plan and patients handout to their most recent address on file. ? ?Charlann Lange, RMA ?Healthcare Concierge  ?[phone] ? ?

## 2021-06-24 NOTE — Telephone Encounter (Signed)
Patient was notified that patient assistance medication has arrived and to come pick it up from the office on 06/24/2021. ?

## 2021-06-28 ENCOUNTER — Ambulatory Visit: Payer: Medicare HMO | Admitting: Physical Therapy

## 2021-06-28 ENCOUNTER — Encounter: Payer: Self-pay | Admitting: Physical Therapy

## 2021-06-28 ENCOUNTER — Telehealth: Payer: Self-pay

## 2021-06-28 ENCOUNTER — Other Ambulatory Visit: Payer: Self-pay

## 2021-06-28 DIAGNOSIS — Z9181 History of falling: Secondary | ICD-10-CM | POA: Diagnosis not present

## 2021-06-28 DIAGNOSIS — R29898 Other symptoms and signs involving the musculoskeletal system: Secondary | ICD-10-CM | POA: Diagnosis not present

## 2021-06-28 DIAGNOSIS — M89662 Osteopathy after poliomyelitis, left lower leg: Secondary | ICD-10-CM | POA: Diagnosis not present

## 2021-06-28 DIAGNOSIS — S3210XS Unspecified fracture of sacrum, sequela: Secondary | ICD-10-CM | POA: Diagnosis not present

## 2021-06-28 DIAGNOSIS — M545 Low back pain, unspecified: Secondary | ICD-10-CM | POA: Diagnosis not present

## 2021-06-28 DIAGNOSIS — B91 Sequelae of poliomyelitis: Secondary | ICD-10-CM

## 2021-06-28 DIAGNOSIS — S322XXS Fracture of coccyx, sequela: Secondary | ICD-10-CM | POA: Diagnosis not present

## 2021-06-28 NOTE — Telephone Encounter (Signed)
Disability parking placard signed and handed back to patient. ?

## 2021-06-28 NOTE — Therapy (Signed)
[place] [facility] ?[address] [place], [place], [address] ?Phone: [phone]   Fax:  [phone] ? ?Physical Therapy Treatment ? ?Patient Details  ?Name: Tiffany Hawkins ?MRN: [id number] ?Date of Birth: [birth year] ?Referring Provider (PT): Girtha Hake MD ? ? ?Encounter Date: 06/28/2021 ? ? PT End of Session - 06/28/21 1634   ? ? Visit Number 44   ? Number of Visits 47   ? Date for PT Re-Evaluation 07/05/21   ? Authorization - Visit Number 4   ? Authorization - Number of Visits 10   ? Progress Note Due on Visit 10   ? PT Start Time 1341   ? PT Stop Time 7048   ? PT Time Calculation (min) 53 min   ? Equipment Utilized During Treatment Gait belt   ? Activity Tolerance Patient tolerated treatment well   ? Behavior During Therapy [facility] for tasks assessed/performed   ? ?  ?  ? ?  ? ? ?Past Medical History:  ?Diagnosis Date  ? Anxiety   ? Colon cancer ([place]) 10/12/2014  ? Stage IIB; had chemo  ? Diabetes mellitus without complication ([place])   ? Hyperlipidemia   ? Hypertension   ? Lateral epicondylitis   ? Personal history of chemotherapy   ? 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 Sleep apnea   ? no CPAP, sx improved with wt loss  ? ? ?Past Surgical History:  ?Procedure Laterality Date  ? APPENDECTOMY    ? BACK SURGERY    ? CATARACT EXTRACTION W/PHACO Left 12/03/2017  ? Procedure: CATARACT EXTRACTION PHACO AND INTRAOCULAR LENS PLACEMENT ([place]) LEFT  DIABETIC;  Surgeon: Eulogio Bear, MD;  Location: [place];  Service: Ophthalmology;  Laterality: Left;  Diabetic - insulin  ? CATARACT EXTRACTION W/PHACO Right 01/14/2018  ? Procedure: CATARACT EXTRACTION PHACO AND INTRAOCULAR LENS PLACEMENT ([place]) RIGHT DIABETIC;  Surgeon: Eulogio Bear, MD;  Location: [place];  Service: Ophthalmology;  Laterality: Right;  Diabetic - insulin  ? CERVICAL DISC SURGERY    ? CHOLECYSTECTOMY    ? COLON SURGERY    ? COLONOSCOPY    ? COLONOSCOPY WITH  PROPOFOL N/A 05/17/2015  ? Procedure: COLONOSCOPY WITH PROPOFOL;  Surgeon: Manya Silvas, MD;  Location: [facility] ENDOSCOPY;  Service: Endoscopy;  Laterality: N/A;  ? COLONOSCOPY WITH PROPOFOL N/A 04/01/2018  ? Procedure: COLONOSCOPY WITH BIOPSY;  Surgeon: [name], MD;  Location: [place];  Service: Endoscopy;  Laterality: N/A;  Diabetic - insulin  ? EMBOLIZATION N/A 01/24/2021  ? Procedure: EMBOLIZATION;  Surgeon: Algernon Huxley, MD;  Location: [place] CV LAB;  Service: Cardiovascular;  Laterality: N/A;  ? ESOPHAGOGASTRODUODENOSCOPY    ? HERNIA REPAIR    ? LOWER EXTREMITY VENOGRAPHY Left 01/24/2021  ? Procedure: LOWER EXTREMITY VENOGRAPHY;  Surgeon: Algernon Huxley, MD;  Location: [place] CV LAB;  Service: Cardiovascular;  Laterality: Left;  ? POLYPECTOMY N/A 04/01/2018  ? Procedure: POLYPECTOMY;  Surgeon: [name], MD;  Location: [place];  Service: Endoscopy;  Laterality: N/A;  ? PORT A CATH INJECTION ([place] HX)    ? Port has been removed  ? TUBAL LIGATION    ? ? ?There were no vitals filed for this visit. ? ? Subjective Assessment - 06/28/21 1631   ? ? Subjective Pt. reports no new complaints.  No falls over weekend.  Pt. arrived to PT with use of  SPC.  PT discussed the benefits of using a rollator again with pt.  Pt. states her dtr. and son want her to use a rollator but pt. remains resistant to daily use.  Pt. has RW at home but states she hasn't used in over a month.   ? Pertinent History per referring note: "Patient notes that she does have a history of chronic back pain which did worsen after her fall. She also endorses a history of polio. Over the past month she feels that she has been more off balance and has had more falls. She is also noticing worsening weakness in her legs. She rates her pain as an 8/10. Is intermittent, stiff. She denies any numbness, tingling or loss of control of bowel or bladder. " H/o Hx cancer, previous back surgery, DM, vertigo, Aortic  atherosclerosis ([place]).   ? Limitations Walking;House hold activities;Sitting;Lifting;Standing   ? Diagnostic tests MRI of lumbar and cervical spine. See imaging history.   ? Patient Stated Goals Reduce pain and improve balance.   ? Currently in Pain? No/denies   ? Pain Onset More than a month ago   ? ?  ?  ? ?  ? ? ? ? ?Neuro Re-Ed:   ?  ?Walking at agility ladder focusing on even stride length/ upright posture 2 laps x 3.  1 episode of dizziness reported while turning at end of agility ladder.  ?  ?Recip. Stair climbing with R UE assist 5x 4 steps with SBA for safety.  No posterior leaning.   ? ?Walking in //-bars with consistent step pattern/ BOS (use of blue line as marker) and mirror feedback.   ?  ?Walking out to car with use of SPC and CGA for safety.  No LOB and more consistent step pattern/ cadence noted.  ? ?  ?There.ex:   ?  ?Nustep L3 for 10 min. B UE/LE (no charge)  ?  ?Seated marching/ LAQ 20x each  Seated RTB hamstring curls 20x.  RTB marching and hip abduction 20x.   ?  ? ? ? PT Long Term Goals - 06/08/21 0928   ? ?  ? PT LONG TERM GOAL #1  ? Title Pt will improve MMT scores by 1/2 grade to promote functional strength gains for transfers, amb, and decreased caregiver burden.   ? Baseline 4+/4 Hip flexion  4/4-  Hip abduction  4+/4  Hip adduction  4+/4  Knee extension  4/4 Knee flexion 4/4 Ankle dorsiflexion  4/4 Ankle plantarflexion.  2/21:  B LE muscle strength (R/L): knee extension (4+/4+), knee flexion (4+/4), hip flexion (4+/4+), hip abduction (4+/4+), hip adduction (5/5), DF (4/4+).   ? Time 4   ? Period Weeks   ? Status Partially Met   ? Target Date 07/05/21   ?  ? PT LONG TERM GOAL #2  ? Title Pt will improve BERG score by 5 pts to promote safety with ambulation and functional tasks.   ? Baseline BERG: 18/56.  11/29: 40/56.  2/21: 39/56   ? Time 4   ? Period Weeks   ? Status Partially Met   ? Target Date 07/05/21   ?  ? PT LONG TERM GOAL #3  ? Title Pt will improve FOTO score to predicted  improvement value of 56 to measure self reported improvement in functional mobility.   ? Baseline Initial eval: 36.   11/7: 54 (decrease in back pain but limitations in safe ambulation).  12/28: 68 (goal met)   ? Time  4   ? Period Weeks   ? Status Achieved   ? Target Date 04/13/21   ?  ? PT LONG TERM GOAL #4  ? Title Pt will demonstrates safety with ambulation using FWW with no LOB and only occ verbal cueing and SBA with 10 minutes of overground walking.   ? Baseline Pt amb 1 min with extensive verbal cueing CGA-min A.  1/24: pt. using SPC occasionally and resistant to use of RW   ? Time 4   ? Period Weeks   ? Status Partially Met   ? Target Date 07/05/21   ?  ? PT LONG TERM GOAL #5  ? Title Pt will decrease worse pain to 7/10 or less to promote greater access to functional mobility.   ? Baseline Present 7/10, Best 4/10, Worst 10/10.  11/29: <3/10 back pain.  2/21: 8/10 L LE pain at worse, best 0/10 and currently 5/10.   ? Time 4   ? Period Weeks   ? Status Partially Met   ? Target Date 07/05/21   ?  ? PT LONG TERM GOAL #6  ? Title Pt. will report no falls consistently for 2 weeks with all aspects of walking at home/ in community with LRAD to improve safety/ mobility.   ? Baseline Pt. using SPC currently and has had a couple recent falls (no injury).  Pt. will benefit from rollator   ? Time 4   ? Period Weeks   ? Status Partially Met   ? Target Date 07/05/21   ? ?  ?  ? ?  ? ? ? ? ? ? ? ? Plan - 06/28/21 1635   ? ? Clinical Impression Statement Tx. focused on L LE muscle strengthening and independence with stairs/ walking in a controlled setting at agility ladder.  Pt. had 1 episode of dizziness while turning at end of agility after completing several laps.  Pt. requires moderate cuing to correct arm swing/ BOS during hallway walking.  Moderate L LE muscle fatigue during seated/standing ther.ex. requiring a few seated rest breaks during tx. session.  Pt. able to ambulate to car at end of tx. with a more consistent  gait pattern/ cadence and no LOB during.  Pt. continues to benefit from CGA with all aspects of walking secondary to fall risk/ balance issues.   ? Personal Factors and Comorbidities Comorbidity 3+;Age;Transportation;

## 2021-06-30 ENCOUNTER — Ambulatory Visit: Payer: Medicare HMO | Admitting: Physical Therapy

## 2021-06-30 ENCOUNTER — Other Ambulatory Visit: Payer: Self-pay

## 2021-06-30 ENCOUNTER — Encounter: Payer: Self-pay | Admitting: Physical Therapy

## 2021-06-30 DIAGNOSIS — R29898 Other symptoms and signs involving the musculoskeletal system: Secondary | ICD-10-CM | POA: Diagnosis not present

## 2021-06-30 DIAGNOSIS — M89662 Osteopathy after poliomyelitis, left lower leg: Secondary | ICD-10-CM | POA: Diagnosis not present

## 2021-06-30 DIAGNOSIS — S3210XS Unspecified fracture of sacrum, sequela: Secondary | ICD-10-CM | POA: Diagnosis not present

## 2021-06-30 DIAGNOSIS — M545 Low back pain, unspecified: Secondary | ICD-10-CM

## 2021-06-30 DIAGNOSIS — Z9181 History of falling: Secondary | ICD-10-CM

## 2021-06-30 DIAGNOSIS — S322XXS Fracture of coccyx, sequela: Secondary | ICD-10-CM | POA: Diagnosis not present

## 2021-06-30 DIAGNOSIS — B91 Sequelae of poliomyelitis: Secondary | ICD-10-CM | POA: Diagnosis not present

## 2021-06-30 NOTE — Therapy (Signed)
[place] ?[facility] ?[address]. [place], [place], [address] ?Phone: [phone]   Fax:  [phone] ? ?Physical Therapy Treatment ? ?Patient Details  ?Name: Tiffany Hawkins ?MRN: [id number] ?Date of Birth: [birth year] ?Referring Provider (PT): Girtha Hake MD ? ? ?Encounter Date: 06/30/2021 ? ? PT End of Session - 07/01/21 1525   ? ? Visit Number 38   ? Number of Visits 47   ? Date for PT Re-Evaluation 07/05/21   ? Authorization - Visit Number 5   ? Authorization - Number of Visits 10   ? Progress Note Due on Visit 10   ? PT Start Time 1348   ? PT Stop Time 1751   ? PT Time Calculation (min) 45 min   ? Equipment Utilized During Treatment Gait belt   ? Activity Tolerance Patient tolerated treatment well   ? Behavior During Therapy [facility] for tasks assessed/performed   ? ?  ?  ? ?  ? ? ?Past Medical History:  ?Diagnosis Date  ? Anxiety   ? Colon cancer ([place]) 10/12/2014  ? Stage IIB; had chemo  ? Diabetes mellitus without complication ([place])   ? Hyperlipidemia   ? Hypertension   ? Lateral epicondylitis   ? Personal history of chemotherapy   ? 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 Sleep apnea   ? no CPAP, sx improved with wt loss  ? ? ?Past Surgical History:  ?Procedure Laterality Date  ? APPENDECTOMY    ? BACK SURGERY    ? CATARACT EXTRACTION W/PHACO Left 12/03/2017  ? Procedure: CATARACT EXTRACTION PHACO AND INTRAOCULAR LENS PLACEMENT ([place]) LEFT  DIABETIC;  Surgeon: Eulogio Bear, MD;  Location: [place];  Service: Ophthalmology;  Laterality: Left;  Diabetic - insulin  ? CATARACT EXTRACTION W/PHACO Right 01/14/2018  ? Procedure: CATARACT EXTRACTION PHACO AND INTRAOCULAR LENS PLACEMENT ([place]) RIGHT DIABETIC;  Surgeon: Eulogio Bear, MD;  Location: [place];  Service: Ophthalmology;  Laterality: Right;  Diabetic - insulin  ? CERVICAL DISC SURGERY    ? CHOLECYSTECTOMY    ? COLON SURGERY    ? COLONOSCOPY    ? COLONOSCOPY WITH  PROPOFOL N/A 05/17/2015  ? Procedure: COLONOSCOPY WITH PROPOFOL;  Surgeon: Manya Silvas, MD;  Location: [facility] ENDOSCOPY;  Service: Endoscopy;  Laterality: N/A;  ? COLONOSCOPY WITH PROPOFOL N/A 04/01/2018  ? Procedure: COLONOSCOPY WITH BIOPSY;  Surgeon: [name], MD;  Location: [place];  Service: Endoscopy;  Laterality: N/A;  Diabetic - insulin  ? EMBOLIZATION N/A 01/24/2021  ? Procedure: EMBOLIZATION;  Surgeon: Algernon Huxley, MD;  Location: [place] CV [facility];  Service: Cardiovascular;  Laterality: N/A;  ? ESOPHAGOGASTRODUODENOSCOPY    ? HERNIA REPAIR    ? LOWER EXTREMITY VENOGRAPHY Left 01/24/2021  ? Procedure: LOWER EXTREMITY VENOGRAPHY;  Surgeon: Algernon Huxley, MD;  Location: [place] [facility];  Service: Cardiovascular;  Laterality: Left;  ? POLYPECTOMY N/A 04/01/2018  ? Procedure: POLYPECTOMY;  Surgeon: [name], MD;  Location: [place];  Service: Endoscopy;  Laterality: N/A;  ? PORT A CATH INJECTION ([place] HX)    ? Port has been removed  ? TUBAL LIGATION    ? ? ?There were no vitals filed for this visit. ? ? Subjective Assessment - 06/30/21 1344   ? ? Subjective Pt. reports no falls this week.  Pt. reports a couple times she had a posterior LOB but  was able to self-correct.   ? Pertinent History per referring note: "Patient notes that she does have a history of chronic back pain which did worsen after her fall. She also endorses a history of polio. Over the past month she feels that she has been more off balance and has had more falls. She is also noticing worsening weakness in her legs. She rates her pain as an 8/10. Is intermittent, stiff. She denies any numbness, tingling or loss of control of bowel or bladder. " H/o Hx cancer, previous back surgery, DM, vertigo, Aortic atherosclerosis ([place]).   ? Limitations Walking;House hold activities;Sitting;Lifting;Standing   ? Diagnostic tests MRI of lumbar and cervical spine. See imaging history.   ? Patient Stated Goals Reduce pain  and improve balance.   ? Currently in Pain? Yes   ? Pain Score 2    ? Pain Location Back   ? Pain Orientation Lower   ? Pain Onset More than a month ago   ? ?  ?  ? ?  ? ? ? ? ?There.ex:   ?  ?Nustep L3 for 10 min. B UE/LE (no charge)  ?  ?Seated marching/ LAQ 20x each   ? ?Stit to standing with added lunges at //-bars (SBA/CGA with L LE lunges/ extra time to return to standing)- 10x each.   ? ? ?Neuro Re-Ed:   ?  ?Tandem stance/ walking in //-bars (marked improvement with stance).   ?  ?Recip. Stair climbing with R UE assist 5x 4 steps with SBA for safety.  No posterior leaning.   ?  ?Walking in //-bars with consistent step pattern/ BOS (use of blue line as marker) and mirror feedback.   ? ?Lateral walking/ braiding technique to L/R with light UE assist required.  ? ?Walking high marching (challenged with L LE wt. Bearing during R hip flexion).   ?  ?Walking out to car with use of SPC and CGA for safety.  No LOB and more consistent step pattern/ cadence noted.  ?  ?  ? ? ? PT Long Term Goals - 06/08/21 0928   ? ?  ? PT LONG TERM GOAL #1  ? Title Pt will improve MMT scores by 1/2 grade to promote functional strength gains for transfers, amb, and decreased caregiver burden.   ? Baseline 4+/4 Hip flexion  4/4-  Hip abduction  4+/4  Hip adduction  4+/4  Knee extension  4/4 Knee flexion 4/4 Ankle dorsiflexion  4/4 Ankle plantarflexion.  2/21:  B LE muscle strength (R/L): knee extension (4+/4+), knee flexion (4+/4), hip flexion (4+/4+), hip abduction (4+/4+), hip adduction (5/5), DF (4/4+).   ? Time 4   ? Period Weeks   ? Status Partially Met   ? Target Date 07/05/21   ?  ? PT LONG TERM GOAL #2  ? Title Pt will improve BERG score by 5 pts to promote safety with ambulation and functional tasks.   ? Baseline BERG: 18/56.  11/29: 40/56.  2/21: 39/56   ? Time 4   ? Period Weeks   ? Status Partially Met   ? Target Date 07/05/21   ?  ? PT LONG TERM GOAL #3  ? Title Pt will improve FOTO score to predicted improvement value of  56 to measure self reported improvement in functional mobility.   ? Baseline Initial eval: 36.   11/7: 54 (decrease in back pain but limitations in safe ambulation).  12/28: 68 (goal met)   ? Time 4   ?  Period Weeks   ? Status Achieved   ? Target Date 04/13/21   ?  ? PT LONG TERM GOAL #4  ? Title Pt will demonstrates safety with ambulation using FWW with no LOB and only occ verbal cueing and SBA with 10 minutes of overground walking.   ? Baseline Pt amb 1 min with extensive verbal cueing CGA-min A.  1/24: pt. using SPC occasionally and resistant to use of RW   ? Time 4   ? Period Weeks   ? Status Partially Met   ? Target Date 07/05/21   ?  ? PT LONG TERM GOAL #5  ? Title Pt will decrease worse pain to 7/10 or less to promote greater access to functional mobility.   ? Baseline Present 7/10, Best 4/10, Worst 10/10.  11/29: <3/10 back pain.  2/21: 8/10 L LE pain at worse, best 0/10 and currently 5/10.   ? Time 4   ? Period Weeks   ? Status Partially Met   ? Target Date 07/05/21   ?  ? PT LONG TERM GOAL #6  ? Title Pt. will report no falls consistently for 2 weeks with all aspects of walking at home/ in community with LRAD to improve safety/ mobility.   ? Baseline Pt. using SPC currently and has had a couple recent falls (no injury).  Pt. will benefit from rollator   ? Time 4   ? Period Weeks   ? Status Partially Met   ? Target Date 07/05/21   ? ?  ?  ? ?  ? ? ? ? ? ? ? ? Plan - 07/01/21 1528   ? ? Clinical Impression Statement Pt. has continued to have no falls over past week and c/o no pain during the treatment session.  Pt. had no LOB during tx. session but difficulty with L LE lunge/ return to standing posture.  Improvement in tandem stance today but light UE assist with tandem walking.  UE assist with lateral braiding technique in //-bars with no LOB. Pt. is educated on the importance of using a SPC for all community and household ambulation for safety as she tends to ambulate with no AD, putting herself at a high  fall risk. Pt. will continue to work on balance and ambulation on uneven terrain with AD so that she is able to safely perform ADLs and reduce her fall risk.  Pt. will continue to benefit from skilled PT

## 2021-07-04 ENCOUNTER — Other Ambulatory Visit: Payer: Self-pay | Admitting: Internal Medicine

## 2021-07-04 DIAGNOSIS — I10 Essential (primary) hypertension: Secondary | ICD-10-CM

## 2021-07-07 ENCOUNTER — Ambulatory Visit: Payer: Medicare HMO | Admitting: Physical Therapy

## 2021-07-07 ENCOUNTER — Encounter: Payer: Self-pay | Admitting: Physical Therapy

## 2021-07-07 ENCOUNTER — Other Ambulatory Visit: Payer: Self-pay

## 2021-07-07 DIAGNOSIS — R29898 Other symptoms and signs involving the musculoskeletal system: Secondary | ICD-10-CM

## 2021-07-07 DIAGNOSIS — S322XXS Fracture of coccyx, sequela: Secondary | ICD-10-CM | POA: Diagnosis not present

## 2021-07-07 DIAGNOSIS — M89662 Osteopathy after poliomyelitis, left lower leg: Secondary | ICD-10-CM | POA: Diagnosis not present

## 2021-07-07 DIAGNOSIS — M545 Low back pain, unspecified: Secondary | ICD-10-CM

## 2021-07-07 DIAGNOSIS — B91 Sequelae of poliomyelitis: Secondary | ICD-10-CM

## 2021-07-07 DIAGNOSIS — Z9181 History of falling: Secondary | ICD-10-CM | POA: Diagnosis not present

## 2021-07-07 DIAGNOSIS — S3210XS Unspecified fracture of sacrum, sequela: Secondary | ICD-10-CM | POA: Diagnosis not present

## 2021-07-07 NOTE — Therapy (Signed)
[place] ?[facility] ?[address]. [place], [place], [address] ?Phone: [phone]   Fax:  [phone] ? ?Physical Therapy Treatment ? ?Patient Details  ?Name: Tiffany Hawkins ?MRN: [id number] ?Date of Birth: February 12, 1946 ?Referring Provider (PT): Girtha Hake MD ? ? ?Encounter Date: 07/07/2021 ? ? PT End of Session - 07/08/21 1849   ? ? Visit Number 50   ? Number of Visits 54   ? Date for PT Re-Evaluation 09/01/21   ? Authorization - Visit Number 6   ? Authorization - Number of Visits 10   ? Progress Note Due on Visit 10   ? PT Start Time 1025   ? PT Stop Time 1115   ? PT Time Calculation (min) 50 min   ? Equipment Utilized During Treatment Gait belt   ? Activity Tolerance Patient tolerated treatment well;Patient limited by fatigue   ? Behavior During Therapy [facility] for tasks assessed/performed   ? ?  ?  ? ?  ? ? ?Past Medical History:  ?Diagnosis Date  ? Anxiety   ? Colon cancer ([place]) 10/12/2014  ? Stage IIB; had chemo  ? Diabetes mellitus without complication ([place])   ? Hyperlipidemia   ? Hypertension   ? Lateral epicondylitis   ? Personal history of chemotherapy   ? 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 Sleep apnea   ? no CPAP, sx improved with wt loss  ? ? ?Past Surgical History:  ?Procedure Laterality Date  ? APPENDECTOMY    ? BACK SURGERY    ? CATARACT EXTRACTION W/PHACO Left 12/03/2017  ? Procedure: CATARACT EXTRACTION PHACO AND INTRAOCULAR LENS PLACEMENT ([place]) LEFT  DIABETIC;  Surgeon: Eulogio Bear, MD;  Location: [place];  Service: Ophthalmology;  Laterality: Left;  Diabetic - insulin  ? CATARACT EXTRACTION W/PHACO Right 01/14/2018  ? Procedure: CATARACT EXTRACTION PHACO AND INTRAOCULAR LENS PLACEMENT ([place]) RIGHT DIABETIC;  Surgeon: Eulogio Bear, MD;  Location: [place];  Service: Ophthalmology;  Laterality: Right;  Diabetic - insulin  ? CERVICAL DISC SURGERY    ? CHOLECYSTECTOMY    ? COLON SURGERY    ?  COLONOSCOPY    ? COLONOSCOPY WITH PROPOFOL N/A 05/17/2015  ? Procedure: COLONOSCOPY WITH PROPOFOL;  Surgeon: Manya Silvas, MD;  Location: [facility] ENDOSCOPY;  Service: Endoscopy;  Laterality: N/A;  ? COLONOSCOPY WITH PROPOFOL N/A 04/01/2018  ? Procedure: COLONOSCOPY WITH BIOPSY;  Surgeon: [name], MD;  Location: [place];  Service: Endoscopy;  Laterality: N/A;  Diabetic - insulin  ? EMBOLIZATION N/A 01/24/2021  ? Procedure: EMBOLIZATION;  Surgeon: Algernon Huxley, MD;  Location: [place] [facility];  Service: Cardiovascular;  Laterality: N/A;  ? ESOPHAGOGASTRODUODENOSCOPY    ? HERNIA REPAIR    ? LOWER EXTREMITY VENOGRAPHY Left 01/24/2021  ? Procedure: LOWER EXTREMITY VENOGRAPHY;  Surgeon: Algernon Huxley, MD;  Location: [place] [facility];  Service: Cardiovascular;  Laterality: Left;  ? POLYPECTOMY N/A 04/01/2018  ? Procedure: POLYPECTOMY;  Surgeon: [name], MD;  Location: [place];  Service: Endoscopy;  Laterality: N/A;  ? PORT A CATH INJECTION ([place] [facility])    ? Port has been removed  ? TUBAL LIGATION    ? ? ?There were no vitals filed for this visit. ? ? Subjective Assessment - 07/08/21 1841   ? ? Subjective Pt. reports no falls over the past week.  Pt. states she has been doing  more walking and went to church with no issues.   ? Pertinent History per referring note: "Patient notes that she does have a history of chronic back pain which did worsen after her fall. She also endorses a history of polio. Over the past month she feels that she has been more off balance and has had more falls. She is also noticing worsening weakness in her legs. She rates her pain as an 8/10. Is intermittent, stiff. She denies any numbness, tingling or loss of control of bowel or bladder. " H/o Hx cancer, previous back surgery, DM, vertigo, Aortic atherosclerosis ([place]).   ? Limitations Walking;House hold activities;Sitting;Lifting;Standing   ? Diagnostic tests MRI of lumbar and cervical spine. See imaging  history.   ? Patient Stated Goals Reduce pain and improve balance.   ? Currently in Pain? No/denies   ? Pain Onset More than a month ago   ? ?  ?  ? ?  ? ? ? ?  ?There.ex:   ?  ?Nustep L3 for 10 min. B UE/LE (no charge)  ? ?Seated marching/ LAQ 20x each   ?  ?Sit to stands 10x each with limited UE assist (mirror feedback).  Standing hip abduction/ extension/ hamstring curls/ wt. Shifting 10x2 each.    ?  ?  ?Neuro Re-Ed:   ? ?Recip. Stairs with R UE assist only 4x.   ? ?Walking outside around building with no assistive device and SBA/CGA for safety and cuing.  O2 sat.: 95% and HR: 92 bpm.   ? ?Tandem walking in //-bars (marked improvement with walking with occasional light UE assist 3x).   ? ?Lateral walking/ braiding technique to L/R with light UE assist required.  ?  ?Walking high marching (challenged with L LE wt. Bearing during R hip flexion).   ? ?3" and 6" step touches in //-bars:  L and R with mirror feedback and light UE assist in //-bars.   ? ?Walking out to car with use of SPC and CGA for safety.  No LOB and more consistent step pattern/ cadence noted.  ?  ?  ? ? ? ? ? PT Long Term Goals - 07/08/21 1902   ? ?  ? PT LONG TERM GOAL #1  ? Title Pt will improve MMT scores by 1/2 grade to promote functional strength gains for transfers, amb, and decreased caregiver burden.   ? Baseline 4+/4 Hip flexion  4/4-  Hip abduction  4+/4  Hip adduction  4+/4  Knee extension  4/4 Knee flexion 4/4 Ankle dorsiflexion  4/4 Ankle plantarflexion.  2/21:  B LE muscle strength (R/L): knee extension (4+/4+), knee flexion (4+/4), hip flexion (4+/4+), hip abduction (4+/4+), hip adduction (5/5), DF (4/4+).   ? Time 8   ? Period Weeks   ? Status Partially Met   ? Target Date 09/01/21   ?  ? PT LONG TERM GOAL #2  ? Title Pt will improve BERG score by 5 pts to promote safety with ambulation and functional tasks.   ? Baseline BERG: 18/56.  11/29: 40/56.  2/21: 39/56.  3/23: 42/56   ? Time 8   ? Period Weeks   ? Status Partially Met   ?  Target Date 09/01/21   ?  ? PT LONG TERM GOAL #3  ? Title Pt will improve FOTO score to predicted improvement value of 56 to measure self reported improvement in functional mobility.   ? Baseline Initial eval: 36.   11/7: 54 (decrease in back  pain but limitations in safe ambulation).  12/28: 68 (goal met)   ? Time 4   ? Period Weeks   ? Status Achieved   ? Target Date 04/13/21   ?  ? PT LONG TERM GOAL #4  ? Title Pt will demonstrates safety with ambulation using FWW with no LOB and only occ verbal cueing and SBA with 10 minutes of overground walking.   ? Baseline Pt amb 1 min with extensive verbal cueing CGA-min A.  1/24: pt. using SPC occasionally and resistant to use of RW   ? Time 8   ? Period Weeks   ? Status Partially Met   ? Target Date 09/01/21   ?  ? PT LONG TERM GOAL #5  ? Title Pt will decrease worse pain to 7/10 or less to promote greater access to functional mobility.   ? Baseline Present 7/10, Best 4/10, Worst 10/10.  11/29: <3/10 back pain.  2/21: 8/10 L LE pain at worse, best 0/10 and currently 5/10.  3/23: no pain reported.   ? Time 4   ? Period Weeks   ? Status Achieved   ? Target Date 07/05/21   ?  ? PT LONG TERM GOAL #6  ? Title Pt. will report no falls consistently for 2 weeks with all aspects of walking at home/ in community with LRAD to improve safety/ mobility.   ? Baseline Pt. using SPC currently and has had a couple recent falls (no injury).  Pt. will benefit from rollator   ? Time 4   ? Period Weeks   ? Status Partially Met   ? Target Date 09/01/21   ? ?  ?  ? ?  ? ? ? ? ? ? ? ? Plan - 07/08/21 1852   ? ? Clinical Impression Statement Pt. had no falls since last tx. session and c/o no pain during the treatment session. Pt. has several posterior LOB during sit to stands/ turning/ dynamic tasks and requires CGA/min. A for safety. Pt. is educated on the importance of using a SPC for all community and household ambulation for safety as she tends to ambulate with no AD, putting herself at a high  fall risk. Pt. will continue to work on balance and ambulation on uneven terrain with AD so that she is able to safely perform ADLs and reduce her fall risk.  Pt. ambulates to car at end of tx. with improved gait p

## 2021-07-14 ENCOUNTER — Encounter: Payer: Self-pay | Admitting: Physical Therapy

## 2021-07-14 ENCOUNTER — Ambulatory Visit: Payer: Medicare HMO | Admitting: Physical Therapy

## 2021-07-14 DIAGNOSIS — M545 Low back pain, unspecified: Secondary | ICD-10-CM

## 2021-07-14 DIAGNOSIS — Z9181 History of falling: Secondary | ICD-10-CM

## 2021-07-14 DIAGNOSIS — S322XXS Fracture of coccyx, sequela: Secondary | ICD-10-CM | POA: Diagnosis not present

## 2021-07-14 DIAGNOSIS — B91 Sequelae of poliomyelitis: Secondary | ICD-10-CM | POA: Diagnosis not present

## 2021-07-14 DIAGNOSIS — R29898 Other symptoms and signs involving the musculoskeletal system: Secondary | ICD-10-CM | POA: Diagnosis not present

## 2021-07-14 DIAGNOSIS — S3210XS Unspecified fracture of sacrum, sequela: Secondary | ICD-10-CM | POA: Diagnosis not present

## 2021-07-14 DIAGNOSIS — M89662 Osteopathy after poliomyelitis, left lower leg: Secondary | ICD-10-CM | POA: Diagnosis not present

## 2021-07-14 NOTE — Therapy (Signed)
[place] ?[facility] ?[address]. [place], [place], [address] ?Phone: [phone]   Fax:  [phone] ? ?Physical Therapy Treatment ? ?Patient Details  ?Name: Tiffany [place] ?MRN: [id number] ?Date of Birth: Jul 14, 1945 ?Referring Provider (PT): Girtha Hake MD ? ? ?Encounter Date: 07/14/2021 ? ? PT End of Session - 07/14/21 1037   ? ? Visit Number 57   ? Number of Visits 54   ? Date for PT Re-Evaluation 09/01/21   ? Authorization - Visit Number 7   ? Authorization - Number of Visits 10   ? Progress Note Due on Visit 10   ? PT Start Time 1028   ? PT Stop Time 1115   ? PT Time Calculation (min) 47 min   ? Equipment Utilized During Treatment Gait belt   ? Activity Tolerance Patient tolerated treatment well;Patient limited by fatigue   ? Behavior During Therapy [facility] for tasks assessed/performed   ? ?  ?  ? ?  ? ? ?Past Medical History:  ?Diagnosis Date  ? Anxiety   ? Colon cancer ([place]) 10/12/2014  ? Stage IIB; had chemo  ? Diabetes mellitus without complication ([place])   ? Hyperlipidemia   ? Hypertension   ? Lateral epicondylitis   ? Personal history of chemotherapy   ? 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 Sleep apnea   ? no CPAP, sx improved with wt loss  ? ? ?Past Surgical History:  ?Procedure Laterality Date  ? APPENDECTOMY    ? BACK SURGERY    ? CATARACT EXTRACTION W/PHACO Left 12/03/2017  ? Procedure: CATARACT EXTRACTION PHACO AND INTRAOCULAR LENS PLACEMENT ([place]) LEFT  DIABETIC;  Surgeon: Eulogio Bear, MD;  Location: [place];  Service: Ophthalmology;  Laterality: Left;  Diabetic - insulin  ? CATARACT EXTRACTION W/PHACO Right 01/14/2018  ? Procedure: CATARACT EXTRACTION PHACO AND INTRAOCULAR LENS PLACEMENT ([place]) RIGHT DIABETIC;  Surgeon: Eulogio Bear, MD;  Location: [place];  Service: Ophthalmology;  Laterality: Right;  Diabetic - insulin  ? CERVICAL DISC SURGERY    ? CHOLECYSTECTOMY    ? COLON SURGERY    ?  COLONOSCOPY    ? COLONOSCOPY WITH PROPOFOL N/A 05/17/2015  ? Procedure: COLONOSCOPY WITH PROPOFOL;  Surgeon: Manya Silvas, MD;  Location: [facility];  Service: Endoscopy;  Laterality: N/A;  ? COLONOSCOPY WITH PROPOFOL N/A 04/01/2018  ? Procedure: COLONOSCOPY WITH BIOPSY;  Surgeon: [name], MD;  Location: [place];  Service: Endoscopy;  Laterality: N/A;  Diabetic - insulin  ? EMBOLIZATION N/A 01/24/2021  ? Procedure: EMBOLIZATION;  Surgeon: Algernon Huxley, MD;  Location: [place] [facility];  Service: Cardiovascular;  Laterality: N/A;  ? ESOPHAGOGASTRODUODENOSCOPY    ? HERNIA REPAIR    ? LOWER EXTREMITY VENOGRAPHY Left 01/24/2021  ? Procedure: LOWER EXTREMITY VENOGRAPHY;  Surgeon: Algernon Huxley, MD;  Location: [place] [facility];  Service: Cardiovascular;  Laterality: Left;  ? POLYPECTOMY N/A 04/01/2018  ? Procedure: POLYPECTOMY;  Surgeon: [name], MD;  Location: [place];  Service: Endoscopy;  Laterality: N/A;  ? PORT A CATH INJECTION ([place] HX)    ? Port has been removed  ? TUBAL LIGATION    ? ? ?There were no vitals filed for this visit. ? ? Subjective Assessment - 07/14/21 1036   ? ? Subjective Pt. entered PT with a cold and wearing a mask.  Pt. states she was  dizzy this morning after breakfast and her sugar was 132.  Pt. states she is stressed because husband quit job on Tuesday.  No recent falls reported.   ? Pertinent History per referring note: "Patient notes that she does have a history of chronic back pain which did worsen after her fall. She also endorses a history of polio. Over the past month she feels that she has been more off balance and has had more falls. She is also noticing worsening weakness in her legs. She rates her pain as an 8/10. Is intermittent, stiff. She denies any numbness, tingling or loss of control of bowel or bladder. " H/o Hx cancer, previous back surgery, DM, vertigo, Aortic atherosclerosis ([place]).   ? Limitations Walking;House hold  activities;Sitting;Lifting;Standing   ? Diagnostic tests MRI of lumbar and cervical spine. See imaging history.   ? Patient Stated Goals Reduce pain and improve balance.   ? Currently in Pain? No/denies   ? Pain Onset More than a month ago   ? ?  ?  ? ?  ? ? ? ?There.ex:   ?  ?Nustep L3 for 10 min. B UE/LE (no charge)  ? ?Sit to stands 10x each with limited UE assist (mirror feedback).  Standing hip abduction/ extension/ hamstring curls/ wt. Shifting 10x2 each.    ? ?Discussed HEP ?  ?  ?Neuro Re-Ed:   ?  ?High marching in //-bars/ turning with no UE assist.  Walking over 6" hurdles with recip. Pattern/ consistent hip flexion.  Limited L LE stance time.   BP: 133/81 ? ?Walking around building outside without [facility] working on consistent recip. Pattern and arm swing.  1 LOB/ lateral step to R requiring UE assist on wall to self-correct.  SBA/ CGA with all outside walking for safety.  O2 sat.: 100% and HR: 88 bpm. ? ?Recip. Stairs with R UE assist only 4x.   ?   ?Tandem walking in //-bars (light UE assist)- 3 laps.  Lateral walking with mirror feedback 3 laps.    ?  ?Walking out to car with use of SPC and CGA for safety.  No LOB and more consistent step pattern/ cadence noted.  ?  ? ? ? ? PT Long Term Goals - 07/08/21 1902   ? ?  ? PT LONG TERM GOAL #1  ? Title Pt will improve MMT scores by 1/2 grade to promote functional strength gains for transfers, amb, and decreased caregiver burden.   ? Baseline 4+/4 Hip flexion  4/4-  Hip abduction  4+/4  Hip adduction  4+/4  Knee extension  4/4 Knee flexion 4/4 Ankle dorsiflexion  4/4 Ankle plantarflexion.  2/21:  B LE muscle strength (R/L): knee extension (4+/4+), knee flexion (4+/4), hip flexion (4+/4+), hip abduction (4+/4+), hip adduction (5/5), DF (4/4+).   ? Time 8   ? Period Weeks   ? Status Partially Met   ? Target Date 09/01/21   ?  ? PT LONG TERM GOAL #2  ? Title Pt will improve BERG score by 5 pts to promote safety with ambulation and functional tasks.   ? Baseline  BERG: 18/56.  11/29: 40/56.  2/21: 39/56.  3/23: 42/56   ? Time 8   ? Period Weeks   ? Status Partially Met   ? Target Date 09/01/21   ?  ? PT LONG TERM GOAL #3  ? Title Pt will improve FOTO score to predicted improvement value of 56 to measure self reported improvement in functional mobility.   ?  Baseline Initial eval: 36.   11/7: 54 (decrease in back pain but limitations in safe ambulation).  12/28: 68 (goal met)   ? Time 4   ? Period Weeks   ? Status Achieved   ? Target Date 04/13/21   ?  ? PT LONG TERM GOAL #4  ? Title Pt will demonstrates safety with ambulation using FWW with no LOB and only occ verbal cueing and SBA with 10 minutes of overground walking.   ? Baseline Pt amb 1 min with extensive verbal cueing CGA-min A.  1/24: pt. using SPC occasionally and resistant to use of RW   ? Time 8   ? Period Weeks   ? Status Partially Met   ? Target Date 09/01/21   ?  ? PT LONG TERM GOAL #5  ? Title Pt will decrease worse pain to 7/10 or less to promote greater access to functional mobility.   ? Baseline Present 7/10, Best 4/10, Worst 10/10.  11/29: <3/10 back pain.  2/21: 8/10 L LE pain at worse, best 0/10 and currently 5/10.  3/23: no pain reported.   ? Time 4   ? Period Weeks   ? Status Achieved   ? Target Date 07/05/21   ?  ? PT LONG TERM GOAL #6  ? Title Pt. will report no falls consistently for 2 weeks with all aspects of walking at home/ in community with [facility] to improve safety/ mobility.   ? Baseline Pt. using SPC currently and has had a couple recent falls (no injury).  Pt. will benefit from rollator   ? Time 4   ? Period Weeks   ? Status Partially Met   ? Target Date 09/01/21   ? ?  ?  ? ?  ? ? ? ? ? ? ? ? Plan - 07/14/21 1038   ? ? Clinical Impression Statement Moderate LE/ generalized muscle fatigue noted during tx. esssion, esp. during outside walking without assistive device.  No LOB but pt. cautious when walking on varying terrain outside.  Dizziness reported with dynamic tasks in //-bars and resolved  with short seated rest break.  Good BP/ HR/ O2 sat. levels during tx.  No changes to HEP and pt. instructed to continue with daily walking program with use of SPC at home to increase endurance.   ? Personal Factors and Comorbid

## 2021-07-21 ENCOUNTER — Encounter: Payer: Self-pay | Admitting: Physical Therapy

## 2021-07-21 ENCOUNTER — Ambulatory Visit: Payer: Medicare HMO | Attending: Physical Medicine & Rehabilitation | Admitting: Physical Therapy

## 2021-07-21 DIAGNOSIS — M545 Low back pain, unspecified: Secondary | ICD-10-CM | POA: Insufficient documentation

## 2021-07-21 DIAGNOSIS — S3210XS Unspecified fracture of sacrum, sequela: Secondary | ICD-10-CM | POA: Diagnosis not present

## 2021-07-21 DIAGNOSIS — Z9181 History of falling: Secondary | ICD-10-CM | POA: Insufficient documentation

## 2021-07-21 DIAGNOSIS — B91 Sequelae of poliomyelitis: Secondary | ICD-10-CM | POA: Insufficient documentation

## 2021-07-21 DIAGNOSIS — M89662 Osteopathy after poliomyelitis, left lower leg: Secondary | ICD-10-CM | POA: Diagnosis not present

## 2021-07-21 DIAGNOSIS — R29898 Other symptoms and signs involving the musculoskeletal system: Secondary | ICD-10-CM | POA: Diagnosis not present

## 2021-07-21 DIAGNOSIS — S322XXS Fracture of coccyx, sequela: Secondary | ICD-10-CM | POA: Insufficient documentation

## 2021-07-21 NOTE — Therapy (Signed)
?OUTPATIENT PHYSICAL THERAPY TREATMENT NOTE ? ? ?Patient Name: Tiffany [place] ?MRN: [id number] ?DOB:[birth year], 76 y.o., female ?Today's Date: 07/21/2021 ? ?PCP: Jonetta Osgood, NP ?REFERRING PROVIDER: Doyle Askew, MD ? ? PT End of Session - 07/21/21 1122   ? ? Visit Number 48   ? Number of Visits 54   ? Date for PT Re-Evaluation 09/01/21   ? Authorization - Visit Number 8   ? Authorization - Number of Visits 10   ? Progress Note Due on Visit --   ? PT Start Time 1023   ? PT Stop Time 1119   ? PT Time Calculation (min) 56 min   ? Equipment Utilized During Treatment Gait belt   ? Activity Tolerance Patient tolerated treatment well;Patient limited by fatigue   ? Behavior During Therapy [facility] for tasks assessed/performed   ? ?  ?  ? ?  ? ? ?Past Medical History:  ?Diagnosis Date  ? Anxiety   ? Colon cancer ([place]) 10/12/2014  ? Stage IIB; had chemo  ? Diabetes mellitus without complication ([place])   ? Hyperlipidemia   ? Hypertension   ? Lateral epicondylitis   ? Personal history of chemotherapy   ? 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 Sleep apnea   ? no CPAP, sx improved with wt loss  ? ?Past Surgical History:  ?Procedure Laterality Date  ? APPENDECTOMY    ? BACK SURGERY    ? CATARACT EXTRACTION W/PHACO Left 12/03/2017  ? Procedure: CATARACT EXTRACTION PHACO AND INTRAOCULAR LENS PLACEMENT ([place]) LEFT  DIABETIC;  Surgeon: Eulogio Bear, MD;  Location: [place];  Service: Ophthalmology;  Laterality: Left;  Diabetic - insulin  ? CATARACT EXTRACTION W/PHACO Right 01/14/2018  ? Procedure: CATARACT EXTRACTION PHACO AND INTRAOCULAR LENS PLACEMENT ([place]) RIGHT DIABETIC;  Surgeon: Eulogio Bear, MD;  Location: [place];  Service: Ophthalmology;  Laterality: Right;  Diabetic - insulin  ? CERVICAL DISC SURGERY    ? CHOLECYSTECTOMY    ? COLON SURGERY    ? COLONOSCOPY    ? COLONOSCOPY WITH PROPOFOL N/A 05/17/2015  ? Procedure: COLONOSCOPY WITH PROPOFOL;  Surgeon: Manya Silvas, MD;  Location: [facility] ENDOSCOPY;  Service: Endoscopy;  Laterality: N/A;  ? COLONOSCOPY WITH PROPOFOL N/A 04/01/2018  ? Procedure: COLONOSCOPY WITH BIOPSY;  Surgeon: [name], MD;  Location: [place];  Service: Endoscopy;  Laterality: N/A;  Diabetic - insulin  ? EMBOLIZATION N/A 01/24/2021  ? Procedure: EMBOLIZATION;  Surgeon: Algernon Huxley, MD;  Location: [place] [facility];  Service: Cardiovascular;  Laterality: N/A;  ? ESOPHAGOGASTRODUODENOSCOPY    ? HERNIA REPAIR    ? LOWER EXTREMITY VENOGRAPHY Left 01/24/2021  ? Procedure: LOWER EXTREMITY VENOGRAPHY;  Surgeon: Algernon Huxley, MD;  Location: [place] [facility];  Service: Cardiovascular;  Laterality: Left;  ? POLYPECTOMY N/A 04/01/2018  ? Procedure: POLYPECTOMY;  Surgeon: [name], MD;  Location: [place];  Service: Endoscopy;  Laterality: N/A;  ? PORT A CATH INJECTION ([place] HX)    ? Port has been removed  ? TUBAL LIGATION    ? ?Patient Active Problem List  ? Diagnosis Date Noted  ? Aortic atherosclerosis ([place]) 12/29/2020  ? Deformity of toe of left foot 03/28/2020  ? H/O multiple pulmonary nodules 09/22/2019  ? Chronic cough 09/22/2019  ? Perennial allergic rhinitis 09/17/2019  ? Acute upper respiratory infection 08/28/2019  ? Weakness 08/28/2019  ? Acute  bronchitis 06/01/2019  ? Cough productive of yellow sputum 06/01/2019  ? Vasomotor rhinitis 05/01/2019  ? Chronic kidney disease, stage II (mild) 03/14/2019  ? Gastroesophageal reflux disease without esophagitis 03/14/2019  ? Intractable episodic headache 03/14/2019  ? Need for prophylactic vaccination with combined diphtheria-tetanus-pertussis (DTaP) vaccine 03/14/2019  ? Renal cyst 11/27/2018  ? Abnormality of gait and mobility 11/27/2018  ? Hepatic steatosis 10/31/2018  ? 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 Elevated ferritin 10/31/2018  ? Vertigo 08/13/2018  ? Gastroenteritis 05/09/2018  ? Diarrhea 05/09/2018  ? Nausea 05/09/2018  ? Personal history  of colon cancer   ? Polyp of sigmoid colon   ? Dysuria 02/23/2018  ? 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 02/10/2018  ? Encounter for general adult medical examination with abnormal findings 02/10/2018  ? Acute non-recurrent pansinusitis 08/24/2017  ? Other headache syndrome 08/24/2017  ? Uncontrolled type 2 diabetes mellitus with hyperglycemia ([place]) 08/24/2017  ? Type 2 diabetes mellitus without complication, with long-term current use of insulin ([place]) 08/22/2017  ? Need for vaccination against Streptococcus pneumoniae using pneumococcal conjugate vaccine 13 08/22/2017  ? Essential hypertension 05/03/2017  ? Otalgia, bilateral 05/02/2017  ? Type 2 diabetes mellitus with hyperglycemia ([place]) 05/02/2017  ? Mixed hyperlipidemia 05/02/2017  ? Right lower quadrant pain 06/29/2016  ? 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  ? 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 History of colon cancer 04/13/2015  ? Cancer of ascending colon ([place]) 10/12/2014  ? ? ?REFERRING DIAG:  ?Diagnosis  ?M54.50 (ICD-10-CM) - Low back pain, unspecified  ?S32.10XA (ICD-10-CM) - Unspecified fracture of sacrum, initial encounter for closed fracture  ?R29.898 (ICD-10-CM) - Other symptoms and signs involving the musculoskeletal system  ? ? ?THERAPY DIAG:  ?Bilateral leg weakness ? ?At high risk for injury related to fall ? ?Polio osteopathy of lower leg, left ([place]) ? ?Bilateral low back pain, unspecified chronicity, unspecified whether sciatica present ? ?Closed fracture of sacrum and coccyx, sequela ? ?PERTINENT HISTORY: See evaluation ? ?PRECAUTIONS: Fall ? ?SUBJECTIVE: Pt. Reports no back/sacral pain and states she is still dealing with a head cold.  Pt. Reports she is walking on a daily basis at home and no recent falls.   ? ?PAIN:  ?Are you having pain? No ? ?TODAY'S TREATMENT:  ?There.ex:   ?  ?Nustep L4 for 10 min. B UE/LE (discussed activities/ walking at home)  ?  ?Sit to stands 10x each with no UE assist and holding a 6# ball.  Standing hip  marching/ wt. Shifting in //-bars with no UE assist.    ? ?Discussed HEP ?  ?  ?Neuro Re-Ed:   ?  ?Walking forward/ lateral in //-bars with posture correction and no UE assist.  Focus on maintaining BOS/ step pattern and upright posture.  5 laps with no LOB.   ? ?[place] ex.: touches with turning CCW with L/R LE. ? ?[place] pick ups with SBA/CGA for safety with focus on knee flexion (preventing lumbar flexion) ?  ?Walking around building outside without [facility] working on consistent recip. Pattern and arm swing.  No LOB but decrease L hip flexion/ step pattern.  SBA/ CGA with all outside walking for safety.   ? ?Recip. Stairs with R UE assist only 5x.   ?    ?Walking out to car with use of SPC and CGA for safety.  No LOB and more consistent step pattern/ cadence noted.  ?  ?  ? ? ?PATIENT EDUCATION: ?Education details: Gait with SPC/  HEP ?Person educated: Patient ?Education method: Explanation, Demonstration, and Handouts ?Education comprehension: verbalized understanding and returned demonstration ? ? ?HOME EXERCISE PROGRAM: ?Standing there.ex. (handouts issued) ? ? ? ? PT Long Term Goals -  ? ?  ? PT LONG TERM GOAL #1  ? Title Pt will improve MMT scores by 1/2 grade to promote functional strength gains for transfers, amb, and decreased caregiver burden.   ? Baseline 4+/4 Hip flexion  4/4-  Hip abduction  4+/4  Hip adduction  4+/4  Knee extension  4/4 Knee flexion 4/4 Ankle dorsiflexion  4/4 Ankle plantarflexion.  2/21:  B LE muscle strength (R/L): knee extension (4+/4+), knee flexion (4+/4), hip flexion (4+/4+), hip abduction (4+/4+), hip adduction (5/5), DF (4/4+).   ? Time 8   ? Period Weeks   ? Status Partially Met   ? Target Date 09/01/21   ?  ? PT LONG TERM GOAL #2  ? Title Pt will improve BERG score by 5 pts to promote safety with ambulation and functional tasks.   ? Baseline BERG: 18/56.  11/29: 40/56.  2/21: 39/56.  3/23: 42/56   ? Time 8   ? Period Weeks   ? Status Partially Met   ? Target Date 09/01/21   ?  ? PT  LONG TERM GOAL #3  ? Title Pt will improve FOTO score to predicted improvement value of 56 to measure self reported improvement in functional mobility.   ? Baseline Initial eval: 36.   11/7: 54 (decrease i

## 2021-07-25 ENCOUNTER — Other Ambulatory Visit: Payer: Self-pay | Admitting: Internal Medicine

## 2021-07-25 DIAGNOSIS — I700 Atherosclerosis of aorta: Secondary | ICD-10-CM

## 2021-07-28 ENCOUNTER — Ambulatory Visit: Payer: Medicare HMO | Admitting: Physical Therapy

## 2021-07-28 ENCOUNTER — Encounter: Payer: Self-pay | Admitting: Physical Therapy

## 2021-07-28 DIAGNOSIS — R29898 Other symptoms and signs involving the musculoskeletal system: Secondary | ICD-10-CM

## 2021-07-28 DIAGNOSIS — S3210XS Unspecified fracture of sacrum, sequela: Secondary | ICD-10-CM

## 2021-07-28 DIAGNOSIS — M545 Low back pain, unspecified: Secondary | ICD-10-CM

## 2021-07-28 DIAGNOSIS — Z9181 History of falling: Secondary | ICD-10-CM | POA: Diagnosis not present

## 2021-07-28 DIAGNOSIS — B91 Sequelae of poliomyelitis: Secondary | ICD-10-CM | POA: Diagnosis not present

## 2021-07-28 DIAGNOSIS — M89662 Osteopathy after poliomyelitis, left lower leg: Secondary | ICD-10-CM | POA: Diagnosis not present

## 2021-07-28 DIAGNOSIS — S322XXS Fracture of coccyx, sequela: Secondary | ICD-10-CM | POA: Diagnosis not present

## 2021-07-28 NOTE — Therapy (Signed)
?OUTPATIENT PHYSICAL THERAPY TREATMENT NOTE ? ? ?Patient Name: Tiffany Hawkins ?MRN: [id number] ?DOB:1945-11-03, 76 y.o., female ?Today's Date: 07/28/2021 ? ?PCP: Jonetta Osgood, NP ?REFERRING PROVIDER: Doyle Askew, MD ? ? PT End of Session - 07/28/21 1034   ? ? Visit Number [id number]   ? Number of Visits 54   ? Date for PT Re-Evaluation 09/01/21   ? Authorization - Visit Number 9   ? Authorization - Number of Visits 10   ? PT Start Time 1027 to 1118 (51 minutes)  ? Equipment Utilized During Treatment Gait belt   ? Activity Tolerance Patient tolerated treatment well;Patient limited by fatigue   ? Behavior During Therapy [facility] for tasks assessed/performed   ? ?  ?  ? ?  ? ? ?Past Medical History:  ?Diagnosis Date  ? Anxiety   ? Colon cancer ([place]) 10/12/2014  ? Stage IIB; had chemo  ? Diabetes mellitus without complication ([place])   ? Hyperlipidemia   ? Hypertension   ? Lateral epicondylitis   ? Personal history of chemotherapy   ? 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 Sleep apnea   ? no CPAP, sx improved with wt loss  ? ?Past Surgical History:  ?Procedure Laterality Date  ? APPENDECTOMY    ? BACK SURGERY    ? CATARACT EXTRACTION W/PHACO Left 12/03/2017  ? Procedure: CATARACT EXTRACTION PHACO AND INTRAOCULAR LENS PLACEMENT ([place]) LEFT  DIABETIC;  Surgeon: Eulogio Bear, MD;  Location: [place];  Service: Ophthalmology;  Laterality: Left;  Diabetic - insulin  ? CATARACT EXTRACTION W/PHACO Right 01/14/2018  ? Procedure: CATARACT EXTRACTION PHACO AND INTRAOCULAR LENS PLACEMENT ([place]) RIGHT DIABETIC;  Surgeon: Eulogio Bear, MD;  Location: [place];  Service: Ophthalmology;  Laterality: Right;  Diabetic - insulin  ? CERVICAL DISC SURGERY    ? CHOLECYSTECTOMY    ? COLON SURGERY    ? COLONOSCOPY    ? COLONOSCOPY WITH PROPOFOL N/A 05/17/2015  ? Procedure: COLONOSCOPY WITH PROPOFOL;  Surgeon: Manya Silvas, MD;  Location: [facility];  Service: Endoscopy;  Laterality:  N/A;  ? COLONOSCOPY WITH PROPOFOL N/A 04/01/2018  ? Procedure: COLONOSCOPY WITH BIOPSY;  Surgeon: [name], MD;  Location: [place];  Service: Endoscopy;  Laterality: N/A;  Diabetic - insulin  ? EMBOLIZATION N/A 01/24/2021  ? Procedure: EMBOLIZATION;  Surgeon: Algernon Huxley, MD;  Location: [place] CV [facility];  Service: Cardiovascular;  Laterality: N/A;  ? ESOPHAGOGASTRODUODENOSCOPY    ? HERNIA REPAIR    ? LOWER EXTREMITY VENOGRAPHY Left 01/24/2021  ? Procedure: LOWER EXTREMITY VENOGRAPHY;  Surgeon: Algernon Huxley, MD;  Location: [place] [facility];  Service: Cardiovascular;  Laterality: Left;  ? POLYPECTOMY N/A 04/01/2018  ? Procedure: POLYPECTOMY;  Surgeon: [name], MD;  Location: [place];  Service: Endoscopy;  Laterality: N/A;  ? PORT A CATH INJECTION ([place] [facility])    ? Port has been removed  ? TUBAL LIGATION    ? ?Patient Active Problem List  ? Diagnosis Date Noted  ? Aortic atherosclerosis ([place]) 12/29/2020  ? Deformity of toe of left foot 03/28/2020  ? H/O multiple pulmonary nodules 09/22/2019  ? Chronic cough 09/22/2019  ? Perennial allergic rhinitis 09/17/2019  ? Acute upper respiratory infection 08/28/2019  ? Weakness 08/28/2019  ? Acute bronchitis 06/01/2019  ? Cough productive of yellow sputum 06/01/2019  ? Vasomotor rhinitis 05/01/2019  ? Chronic kidney disease, stage II (  mild) 03/14/2019  ? Gastroesophageal reflux disease without esophagitis 03/14/2019  ? Intractable episodic headache 03/14/2019  ? Need for prophylactic vaccination with combined diphtheria-tetanus-pertussis (DTaP) vaccine 03/14/2019  ? Renal cyst 11/27/2018  ? Abnormality of gait and mobility 11/27/2018  ? Hepatic steatosis 10/31/2018  ? 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 Elevated ferritin 10/31/2018  ? Vertigo 08/13/2018  ? Gastroenteritis 05/09/2018  ? Diarrhea 05/09/2018  ? Nausea 05/09/2018  ? Personal history of colon cancer   ? Polyp of sigmoid colon   ? Dysuria 02/23/2018  ?  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 02/10/2018  ? Encounter for general adult medical examination with abnormal findings 02/10/2018  ? Acute non-recurrent pansinusitis 08/24/2017  ? Other headache syndrome 08/24/2017  ? Uncontrolled type 2 diabetes mellitus with hyperglycemia ([place]) 08/24/2017  ? Type 2 diabetes mellitus without complication, with long-term current use of insulin ([place]) 08/22/2017  ? Need for vaccination against Streptococcus pneumoniae using pneumococcal conjugate vaccine 13 08/22/2017  ? Essential hypertension 05/03/2017  ? Otalgia, bilateral 05/02/2017  ? Type 2 diabetes mellitus with hyperglycemia ([place]) 05/02/2017  ? Mixed hyperlipidemia 05/02/2017  ? Right lower quadrant pain 06/29/2016  ? 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  ? 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 History of colon cancer 04/13/2015  ? Cancer of ascending colon ([place]) 10/12/2014  ? ? ?REFERRING DIAG:  ?Diagnosis  ?M54.50 (ICD-10-CM) - Low back pain, unspecified  ?S32.10XA (ICD-10-CM) - Unspecified fracture of sacrum, initial encounter for closed fracture  ?R29.898 (ICD-10-CM) - Other symptoms and signs involving the musculoskeletal system  ? ? ?THERAPY DIAG:  ?Bilateral leg weakness ? ?At high risk for injury related to fall ? ?Polio osteopathy of lower leg, left ([place]) ? ?Bilateral low back pain, unspecified chronicity, unspecified whether sciatica present ? ?Closed fracture of sacrum and coccyx, sequela ? ?PERTINENT HISTORY: See evaluation ? ?PRECAUTIONS: Fall ? ?SUBJECTIVE: Pt. Reports no back/sacral pain today.  Pt. Reports no recent falls and has been walking around house/ outside to family house.  Pt. Still has productive cough and wearing a face mask during tx. session.   ? ?PAIN:  ?Are you having pain? No ? ?TODAY'S TREATMENT:  ?There.ex:   ?  ?Nustep L4 for 10 min. B UE/LE (discussed activities/ walking at home)- no recent falls reported.  ? ?Seated marching/ LAQ/ heel raises 20x.  ? ?Standing ex. In //-bars:  marching/ hip abduction/ partial squats 20x each.  No ankle wts.   ?  ?Sit to stands 10x each with no UE assist (gray chair).  Standing wt. Shifting in //-bars with no UE assist.    ? ?Reviewed HEP/ will focus on L LE ?  ?  ?Neuro Re-Ed:   ?  ?Walking forward/ lateral in //-bars with posture correction and no UE assist.  Focus on maintaining BOS/ step pattern and upright posture.  5 laps with no LOB.   ? ?Recip. Stairs with R UE assist only 5x. ? ?Cone pick ups with SBA/CGA for safety with focus on knee flexion (preventing lumbar flexion) ?  ?Walking around building outside without [facility] working on consistent recip. Pattern and arm swing.  No LOB but decrease L hip flexion/ step pattern.  SBA/ CGA with all outside walking for safety.   ? ?Walking out to car with use of SPC and CGA for safety.  No LOB and more consistent step pattern/ cadence noted.  ?  ?  ? ? ?PATIENT EDUCATION: ?Education details: Gait with SPC/ HEP ?Person  educated: Patient ?Education method: Explanation, Demonstration, and Handouts ?Education comprehension: verbalized understanding and returned demonstration ? ? ?HOME EXERCISE PROGRAM: ?Standing there.ex. (handouts issued) ? ? ? ? PT Long Term Goals -  ? ?  ? PT LONG TERM GOAL #1  ? Title Pt will improve MMT scores by 1/2 grade to promote functional strength gains for transfers, amb, and decreased caregiver burden.   ? Baseline 4+/4 Hip flexion  4/4-  Hip abduction  4+/4  Hip adduction  4+/4  Knee extension  4/4 Knee flexion 4/4 Ankle dorsiflexion  4/4 Ankle plantarflexion.  2/21:  B LE muscle strength (R/L): knee extension (4+/4+), knee flexion (4+/4), hip flexion (4+/4+), hip abduction (4+/4+), hip adduction (5/5), DF (4/4+).   ? Time 8   ? Period Weeks   ? Status Partially Met   ? Target Date 09/01/21   ?  ? PT LONG TERM GOAL #2  ? Title Pt will improve BERG score by 5 pts to promote safety with ambulation and functional tasks.   ? Baseline BERG: 18/56.  11/29: 40/56.  2/21: 39/56.  3/23: 42/56    ? Time 8   ? Period Weeks   ? Status Partially Met   ? Target Date 09/01/21   ?  ? PT LONG TERM GOAL #3  ? Title Pt will improve FOTO score to predicted improvement value of 56 to measure self reported improvem

## 2021-08-01 ENCOUNTER — Other Ambulatory Visit: Payer: Self-pay | Admitting: Nurse Practitioner

## 2021-08-03 ENCOUNTER — Ambulatory Visit (INDEPENDENT_AMBULATORY_CARE_PROVIDER_SITE_OTHER): Payer: Medicare HMO | Admitting: Nurse Practitioner

## 2021-08-03 ENCOUNTER — Encounter: Payer: Self-pay | Admitting: Nurse Practitioner

## 2021-08-03 VITALS — BP 138/66 | HR 91 | Temp 98.2°F | Resp 16 | Ht 61.5 in | Wt 153.6 lb

## 2021-08-03 DIAGNOSIS — I10 Essential (primary) hypertension: Secondary | ICD-10-CM | POA: Diagnosis not present

## 2021-08-03 DIAGNOSIS — R053 Chronic cough: Secondary | ICD-10-CM

## 2021-08-03 DIAGNOSIS — Z794 Long term (current) use of insulin: Secondary | ICD-10-CM | POA: Diagnosis not present

## 2021-08-03 DIAGNOSIS — E782 Mixed hyperlipidemia: Secondary | ICD-10-CM

## 2021-08-03 DIAGNOSIS — E119 Type 2 diabetes mellitus without complications: Secondary | ICD-10-CM | POA: Diagnosis not present

## 2021-08-03 LAB — POCT GLYCOSYLATED HEMOGLOBIN (HGB A1C): Hemoglobin A1C: 6.8 % — AB (ref 4.0–5.6)

## 2021-08-03 MED ORDER — HYDROCOD POLI-CHLORPHE POLI ER 10-8 MG/5ML PO SUER: 5.0000 mL | Freq: Two times a day (BID) | ORAL | 0 refills | Status: DC | PRN

## 2021-08-03 NOTE — Progress Notes (Signed)
[facility] ?[address] ?[place], [place] [address] ? ?Internal MEDICINE  ?Office Visit Note ? ?Patient Name: Tiffany Hawkins ? [id number]  ?[id number] ? ?Date of Service: 08/03/2021 ? ?Chief Complaint  ?Patient presents with  ? Follow-up  ? Diabetes  ? Hyperlipidemia  ? Hypertension  ? Anxiety  ? Cough  ? ? ?HPI ?Tiffany Hawkins presents for follow-up visit for diabetes, hypertension and anxiety.  Her A1c is significantly improved today to 6.8.  Her previous A1c was 7.5 in January.    She is at goal of an A1c less than 7.0.  He has been working on her diet since her previous office visit in January and has made the necessary improvements to better control her glucose and her A1c. ?A1c significantly improved, at goal of less than 7.0 ? ? ?Current Medication: ?Outpatient Encounter Medications as of 08/03/2021  ?Medication Sig  ? albuterol (VENTOLIN HFA) 108 (90 Base) MCG/ACT inhaler Inhale 2 puffs into the lungs every 6 (six) hours as needed for wheezing or shortness of breath.  ? amLODipine (NORVASC) 5 MG tablet TAKE 2 TABLETS DAILY.  ? Blood Glucose Monitoring Suppl (TRUE METRIX METER) w/Device KIT USE AS DIRECTED  ? cetirizine (ZYRTEC) 10 MG chewable tablet Chew 1 tablet (10 mg total) by mouth daily.  ? chlorpheniramine-HYDROcodone (TUSSIONEX PENNKINETIC ER) 10-8 MG/5ML Take 5 mLs by mouth every 12 (twelve) hours as needed for cough.  ? citalopram (CELEXA) 20 MG tablet TAKE 2 TABLETS DAILY FOR DEPRESSION.  ? Dapagliflozin Propanediol (FARXIGA PO) Take by mouth.  ? fenofibrate 54 MG tablet TAKE 1 TABLET EVERY DAY  ? fluticasone (FLONASE) 50 MCG/ACT nasal spray Place 2 sprays into both nostrils daily.  ? ibuprofen (ADVIL) 800 MG tablet Take 1 tablet (800 mg total) by mouth every 8 (eight) hours as needed.  ? insulin aspart protamine - aspart (NOVOLOG MIX 70/30 FLEXPEN) (70-30) 100 UNIT/ML FlexPen Inject 0.35 mLs (35 Units total) into the skin 2 (two) times daily. Inject 35 units Tiffany Hawkins QAM. Gradually increase to  40units Tiffany Hawkins QPM  ? lovastatin (MEVACOR) 20 MG tablet TAKE 1 TABLET AT BEDTIME FOR HIGH CHOLESTEROL  ? Multiple Vitamin (MULTIVITAMIN) tablet Take by mouth daily. TAKES 1/2 TABLET  ? pantoprazole (PROTONIX) 20 MG tablet TAKE 2 TABLETS EVERY DAY  ? sharps container 1 each by Does not apply route as needed. To use for insulin syringes and needles. Taking insulin at least twice daily.  E11.65  ? spironolactone (ALDACTONE) 100 MG tablet Take 100 mg by mouth daily. 2 tablets once a day  ? TRUE METRIX BLOOD GLUCOSE TEST test strip TEST BLOOD SUGAR THREE TIMES DAILY AFTER MEALS  ? TRUEplus Lancets 33G MISC Use as directed three times a day E11.65  ? [DISCONTINUED] chlorpheniramine-HYDROcodone (TUSSIONEX PENNKINETIC ER) 10-8 MG/5ML SUER Take 5 mLs by mouth at bedtime as needed for cough.  ? ?No facility-administered encounter medications on file as of 08/03/2021.  ? ? ?Surgical History: ?Past Surgical History:  ?Procedure Laterality Date  ? APPENDECTOMY    ? BACK SURGERY    ? CATARACT EXTRACTION W/PHACO Left 12/03/2017  ? Procedure: CATARACT EXTRACTION PHACO AND INTRAOCULAR LENS PLACEMENT ([place]) LEFT  DIABETIC;  Surgeon: Eulogio Bear, MD;  Location: [place];  Service: Ophthalmology;  Laterality: Left;  Diabetic - insulin  ? CATARACT EXTRACTION W/PHACO Right 01/14/2018  ? Procedure: CATARACT EXTRACTION PHACO AND INTRAOCULAR LENS PLACEMENT ([place]) RIGHT DIABETIC;  Surgeon: Eulogio Bear, MD;  Location: [place];  Service: Ophthalmology;  Laterality: Right;  Diabetic - insulin  ? CERVICAL DISC SURGERY    ? CHOLECYSTECTOMY    ? COLON SURGERY    ? COLONOSCOPY    ? COLONOSCOPY WITH PROPOFOL N/A 05/17/2015  ? Procedure: COLONOSCOPY WITH PROPOFOL;  Surgeon: Manya Silvas, MD;  Location: [facility]OSCOPY;  Service: Endoscopy;  Laterality: N/A;  ? COLONOSCOPY WITH PROPOFOL N/A 04/01/2018  ? Procedure: COLONOSCOPY WITH BIOPSY;  Surgeon: [name], MD;  Location: [place];  Service: Endoscopy;   Laterality: N/A;  Diabetic - insulin  ? EMBOLIZATION N/A 01/24/2021  ? Procedure: EMBOLIZATION;  Surgeon: Algernon Huxley, MD;  Location: [place] [facility];  Service: Cardiovascular;  Laterality: N/A;  ? ESOPHAGOGASTRODUODENOSCOPY    ? HERNIA REPAIR    ? LOWER EXTREMITY VENOGRAPHY Left 01/24/2021  ? Procedure: LOWER EXTREMITY VENOGRAPHY;  Surgeon: Algernon Huxley, MD;  Location: [place] [facility];  Service: Cardiovascular;  Laterality: Left;  ? POLYPECTOMY N/A 04/01/2018  ? Procedure: POLYPECTOMY;  Surgeon: [name], MD;  Location: [place];  Service: Endoscopy;  Laterality: N/A;  ? PORT A CATH INJECTION ([place] [facility])    ? Port has been removed  ? TUBAL LIGATION    ? ? ?Medical History: ?Past Medical History:  ?Diagnosis Date  ? Anxiety   ? Colon cancer ([place]) 10/12/2014  ? Stage IIB; had chemo  ? Diabetes mellitus without complication ([place])   ? Hyperlipidemia   ? Hypertension   ? Lateral epicondylitis   ? Personal history of chemotherapy   ? 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 Sleep apnea   ? no CPAP, sx improved with wt loss  ? ? ?Family History: ?Family History  ?Problem Relation Age of Onset  ? Alzheimer's disease Mother   ? Heart disease Father   ? Colon cancer Cousin   ? Colon cancer Cousin   ? Cancer Cousin   ?     Brain Tumor  ? Breast cancer Neg Hx   ? ? ?Social History  ? ?Socioeconomic History  ? Marital status: Married  ?  Spouse name: Not on file  ? Number of children: Not on file  ? Years of education: Not on file  ? Highest education level: Not on file  ?Occupational History  ? Not on file  ?Tobacco Use  ? Smoking status: Former  ?  Packs/day: 1.50  ?  Years: 20.00  ?  Pack years: 30.00  ?  Types: Cigarettes  ?  Quit date: 04/17/1981  ?  Years since quitting: 40.3  ? Smokeless tobacco: Never  ?Vaping Use  ? Vaping Use: Never used  ?Substance and Sexual Activity  ? Alcohol use: No  ? Drug use: No  ? Sexual activity: Yes  ?Other Topics Concern  ? Not on file  ?Social  History Narrative  ? Not on file  ? ?Social Determinants of Health  ? ?Financial Resource Strain: Low Risk   ? Difficulty of Paying Living Expenses: Not hard at all  ?Food Insecurity: Not on file  ?Transportation Needs: No Transportation Needs  ? Lack of Transportation (Medical): No  ? Lack of Transportation (Non-Medical): No  ?Physical Activity: Not on file  ?Stress: Not on file  ?Social Connections: Not on file  ?Intimate Partner Violence: Not on file  ? ? ? ? ?Review of Systems  ?Constitutional:  Negative for chills, fatigue and unexpected weight change.  ?HENT:  Negative for  congestion, rhinorrhea, sneezing and sore throat.   ?Eyes:  Negative for redness.  ?Respiratory:  Negative for cough, chest tightness and shortness of breath.   ?Cardiovascular:  Negative for chest pain and palpitations.  ?Gastrointestinal:  Negative for abdominal pain, constipation, diarrhea, nausea and vomiting.  ?Genitourinary:  Negative for dysuria and frequency.  ?Musculoskeletal:  Negative for arthralgias, back pain, joint swelling and neck pain.  ?Skin:  Negative for rash.  ?Neurological: Negative.  Negative for tremors and numbness.  ?Hematological:  Negative for adenopathy. Does not bruise/bleed easily.  ?Psychiatric/Behavioral:  Negative for behavioral problems (Depression), sleep disturbance and suicidal ideas. The patient is not nervous/anxious.   ? ?Vital Signs: ?BP 138/66   Pulse 91   Temp 98.2 ?F (36.8 ?C)   Resp 16   Ht 5' 1.5" (1.562 m)   Wt 153 lb 9.6 oz (69.7 kg)   SpO2 97%   BMI 28.55 kg/m?  ? ? ?Physical Exam ?Vitals reviewed.  ?Constitutional:   ?   General: She is not in acute distress. ?   Appearance: Normal appearance. She is normal weight. She is not ill-appearing.  ?HENT:  ?   Head: Normocephalic and atraumatic.  ?Eyes:  ?   Pupils: Pupils are equal, round, and reactive to light.  ?Cardiovascular:  ?   Rate and Rhythm: Normal rate and regular rhythm.  ?Pulmonary:  ?   Effort: Pulmonary effort is normal. No  respiratory distress.  ?Neurological:  ?   Mental Status: She is alert and oriented to person, place, and time.  ?   Cranial Nerves: No cranial nerve deficit.  ?   Coordination: Coordination normal.  ?   Gait: Gait n

## 2021-08-04 ENCOUNTER — Ambulatory Visit: Payer: Medicare HMO | Admitting: Physical Therapy

## 2021-08-04 ENCOUNTER — Encounter: Payer: Self-pay | Admitting: Physical Therapy

## 2021-08-04 DIAGNOSIS — B91 Sequelae of poliomyelitis: Secondary | ICD-10-CM

## 2021-08-04 DIAGNOSIS — S3210XS Unspecified fracture of sacrum, sequela: Secondary | ICD-10-CM

## 2021-08-04 DIAGNOSIS — M545 Low back pain, unspecified: Secondary | ICD-10-CM

## 2021-08-04 DIAGNOSIS — R29898 Other symptoms and signs involving the musculoskeletal system: Secondary | ICD-10-CM

## 2021-08-04 DIAGNOSIS — M89662 Osteopathy after poliomyelitis, left lower leg: Secondary | ICD-10-CM | POA: Diagnosis not present

## 2021-08-04 DIAGNOSIS — Z9181 History of falling: Secondary | ICD-10-CM | POA: Diagnosis not present

## 2021-08-04 DIAGNOSIS — S322XXS Fracture of coccyx, sequela: Secondary | ICD-10-CM | POA: Diagnosis not present

## 2021-08-06 NOTE — Therapy (Signed)
?OUTPATIENT PHYSICAL THERAPY TREATMENT NOTE ?Physical Therapy Progress Note ? ? ?Dates of reporting period  06/16/21   to  08/04/21   ? ? ?Patient Name: Tiffany Hawkins ?MRN: [id number] ?DOB:[birth year], 76 y.o., female ?Today's Date: 08/06/2021 ? ?PCP: Jonetta Osgood, NP ?REFERRING PROVIDER: Doyle Askew, MD ? ? PT End of Session - 07/28/21 1034   ? ? Visit Number 50  ? Number of Visits 54   ? Date for PT Re-Evaluation 09/01/21   ? Authorization - Visit Number 10  ? Authorization - Number of Visits 10   ? PT Start Time 1021 to 1120 (59 minutes)  ? Equipment Utilized During Treatment Gait belt   ? Activity Tolerance Patient tolerated treatment well;Patient limited by fatigue   ? Behavior During Therapy [facility] for tasks assessed/performed   ? ?  ?  ? ?  ? ? ?Past Medical History:  ?Diagnosis Date  ? Anxiety   ? Colon cancer ([place]) 10/12/2014  ? Stage IIB; had chemo  ? Diabetes mellitus without complication ([place])   ? Hyperlipidemia   ? Hypertension   ? Lateral epicondylitis   ? Personal history of chemotherapy   ? 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 Sleep apnea   ? no CPAP, sx improved with wt loss  ? ?Past Surgical History:  ?Procedure Laterality Date  ? APPENDECTOMY    ? BACK SURGERY    ? CATARACT EXTRACTION W/PHACO Left 12/03/2017  ? Procedure: CATARACT EXTRACTION PHACO AND INTRAOCULAR LENS PLACEMENT ([place]) LEFT  DIABETIC;  Surgeon: Eulogio Bear, MD;  Location: [place];  Service: Ophthalmology;  Laterality: Left;  Diabetic - insulin  ? CATARACT EXTRACTION W/PHACO Right 01/14/2018  ? Procedure: CATARACT EXTRACTION PHACO AND INTRAOCULAR LENS PLACEMENT ([place]) RIGHT DIABETIC;  Surgeon: Eulogio Bear, MD;  Location: [place];  Service: Ophthalmology;  Laterality: Right;  Diabetic - insulin  ? CERVICAL DISC SURGERY    ? CHOLECYSTECTOMY    ? COLON SURGERY    ? COLONOSCOPY    ? COLONOSCOPY WITH PROPOFOL N/A 05/17/2015  ? Procedure: COLONOSCOPY WITH PROPOFOL;  Surgeon:  Manya Silvas, MD;  Location: [facility] ENDOSCOPY;  Service: Endoscopy;  Laterality: N/A;  ? COLONOSCOPY WITH PROPOFOL N/A 04/01/2018  ? Procedure: COLONOSCOPY WITH BIOPSY;  Surgeon: [name], MD;  Location: [place];  Service: Endoscopy;  Laterality: N/A;  Diabetic - insulin  ? EMBOLIZATION N/A 01/24/2021  ? Procedure: EMBOLIZATION;  Surgeon: Algernon Huxley, MD;  Location: [place] CV LAB;  Service: Cardiovascular;  Laterality: N/A;  ? ESOPHAGOGASTRODUODENOSCOPY    ? HERNIA REPAIR    ? LOWER EXTREMITY VENOGRAPHY Left 01/24/2021  ? Procedure: LOWER EXTREMITY VENOGRAPHY;  Surgeon: Algernon Huxley, MD;  Location: [place] CV LAB;  Service: Cardiovascular;  Laterality: Left;  ? POLYPECTOMY N/A 04/01/2018  ? Procedure: POLYPECTOMY;  Surgeon: [name], MD;  Location: [place];  Service: Endoscopy;  Laterality: N/A;  ? PORT A CATH INJECTION ([place] HX)    ? Port has been removed  ? TUBAL LIGATION    ? ?Patient Active Problem List  ? Diagnosis Date Noted  ? Aortic atherosclerosis ([place]) 12/29/2020  ? Deformity of toe of left foot 03/28/2020  ? H/O multiple pulmonary nodules 09/22/2019  ? Chronic cough 09/22/2019  ? Perennial allergic rhinitis 09/17/2019  ? Acute upper respiratory infection 08/28/2019  ? Weakness 08/28/2019  ? Acute bronchitis 06/01/2019  ? Cough  productive of yellow sputum 06/01/2019  ? Vasomotor rhinitis 05/01/2019  ? Chronic kidney disease, stage II (mild) 03/14/2019  ? Gastroesophageal reflux disease without esophagitis 03/14/2019  ? Intractable episodic headache 03/14/2019  ? Need for prophylactic vaccination with combined diphtheria-tetanus-pertussis (DTaP) vaccine 03/14/2019  ? Renal cyst 11/27/2018  ? Abnormality of gait and mobility 11/27/2018  ? Hepatic steatosis 10/31/2018  ? 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 Elevated ferritin 10/31/2018  ? Vertigo 08/13/2018  ? Gastroenteritis 05/09/2018  ? Diarrhea 05/09/2018  ? Nausea 05/09/2018  ? Personal  history of colon cancer   ? Polyp of sigmoid colon   ? Dysuria 02/23/2018  ? 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 02/10/2018  ? Encounter for general adult medical examination with abnormal findings 02/10/2018  ? Acute non-recurrent pansinusitis 08/24/2017  ? Other headache syndrome 08/24/2017  ? Uncontrolled type 2 diabetes mellitus with hyperglycemia ([place]) 08/24/2017  ? Type 2 diabetes mellitus without complication, with long-term current use of insulin ([place]) 08/22/2017  ? Need for vaccination against Streptococcus pneumoniae using pneumococcal conjugate vaccine 13 08/22/2017  ? Essential hypertension 05/03/2017  ? Otalgia, bilateral 05/02/2017  ? Type 2 diabetes mellitus with hyperglycemia ([place]) 05/02/2017  ? Mixed hyperlipidemia 05/02/2017  ? Right lower quadrant pain 06/29/2016  ? 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  ? 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 History of colon cancer 04/13/2015  ? Cancer of ascending colon ([place]) 10/12/2014  ? ? ?REFERRING DIAG:  ?Diagnosis  ?M54.50 (ICD-10-CM) - Low back pain, unspecified  ?S32.10XA (ICD-10-CM) - Unspecified fracture of sacrum, initial encounter for closed fracture  ?R29.898 (ICD-10-CM) - Other symptoms and signs involving the musculoskeletal system  ? ? ?THERAPY DIAG:  ?Bilateral leg weakness ? ?At high risk for injury related to fall ? ?Polio osteopathy of lower leg, left ([place]) ? ?Bilateral low back pain, unspecified chronicity, unspecified whether sciatica present ? ?Closed fracture of sacrum and coccyx, sequela ? ?PERTINENT HISTORY: See evaluation ? ?PRECAUTIONS: Fall ? ?SUBJECTIVE: Pt. Reports no back/sacral pain today.  Pt. States her A1C was below 7.  Pt. Reports no recent falls.   ? ?PAIN:  ?Are you having pain? No ? ?TODAY'S TREATMENT:  ?There.ex:   ?  ?Nustep L4 for 10 min. B UE/LE (discussed putting PT tx. Sessions on hold for next couple of weeks for pt. To focus on daily walking/ independent HEP).    ? ?Seated marching/ LAQ/ heel raises 20x.   ? ?Standing ex. In //-bars: marching/ hip abduction/ partial squats 20x each.  No ankle wts.   ?  ?Sit to stands 10x each with no UE assist (gray chair).  Standing wt. Shifting in //-bars with no UE assist.    ? ?Reviewed HEP/ will focus on L LE ?  ?  ?Neuro Re-Ed:   ?  ?Walking forward/ lateral in //-bars with posture correction and no UE assist.  Focus on maintaining BOS/ step pattern and upright posture.  5 laps with no LOB.   ? ?Turning CW/CCW in //-bars with no UE assist.  Slight posterior lean noted 2x.  Pt. Able to self-correct with cuing.   ? ?Recip. Stairs with R UE assist only 5x. ?  ?Walking around building outside without [facility] working on consistent recip. Pattern and arm swing.  No LOB but decrease L hip flexion/ step pattern.  SBA/ CGA with all outside walking for safety.   ? ?Walking out to car with use of SPC and CGA for safety.  No LOB  and more consistent step pattern/ cadence noted.  ?  ?  ? ? ?PATIENT EDUCATION: ?Education details: Gait with SPC/ HEP ?Person educated: Patient ?Education method: Explanation, Demonstration, and Handouts ?Education comprehension: verbalized understanding and returned demonstration ? ? ?HOME EXERCISE PROGRAM: ?Standing there.ex. (handouts issued) ? ? ? ? PT Long Term Goals -  ? ?  ? PT LONG TERM GOAL #1  ? Title Pt will improve MMT scores by 1/2 grade to promote functional strength gains for transfers, amb, and decreased caregiver burden.   ? Baseline 4+/4 Hip flexion  4/4-  Hip abduction  4+/4  Hip adduction  4+/4  Knee extension  4/4 Knee flexion 4/4 Ankle dorsiflexion  4/4 Ankle plantarflexion.  2/21:  B LE muscle strength (R/L): knee extension (4+/4+), knee flexion (4+/4), hip flexion (4+/4+), hip abduction (4+/4+), hip adduction (5/5), DF (4/4+).   ? Time 8   ? Period Weeks   ? Status Partially Met   ? Target Date 09/01/21   ?  ? PT LONG TERM GOAL #2  ? Title Pt will improve BERG score by 5 pts to promote safety with ambulation and functional tasks.   ? Baseline  BERG: 18/56.  11/29: 40/56.  2/21: 39/56.  3/23: 42/56   ? Time 8   ? Period Weeks   ? Status Partially Met   ? Target Date 09/01/21   ?  ? PT LONG TERM GOAL #3  ? Title Pt will improve FOTO score to pr

## 2021-08-11 ENCOUNTER — Encounter: Payer: Medicare HMO | Admitting: Physical Therapy

## 2021-08-21 ENCOUNTER — Encounter: Payer: Self-pay | Admitting: Nurse Practitioner

## 2021-09-02 ENCOUNTER — Ambulatory Visit (INDEPENDENT_AMBULATORY_CARE_PROVIDER_SITE_OTHER): Payer: Medicare HMO | Admitting: Vascular Surgery

## 2021-09-02 ENCOUNTER — Encounter (INDEPENDENT_AMBULATORY_CARE_PROVIDER_SITE_OTHER): Payer: Self-pay | Admitting: Vascular Surgery

## 2021-09-02 VITALS — BP 132/80 | HR 82 | Resp 16 | Wt 153.6 lb

## 2021-09-02 DIAGNOSIS — I700 Atherosclerosis of aorta: Secondary | ICD-10-CM

## 2021-09-02 DIAGNOSIS — N9489 Other specified conditions associated with female genital organs and menstrual cycle: Secondary | ICD-10-CM | POA: Diagnosis not present

## 2021-09-02 DIAGNOSIS — Z794 Long term (current) use of insulin: Secondary | ICD-10-CM | POA: Diagnosis not present

## 2021-09-02 DIAGNOSIS — E782 Mixed hyperlipidemia: Secondary | ICD-10-CM | POA: Diagnosis not present

## 2021-09-02 DIAGNOSIS — E119 Type 2 diabetes mellitus without complications: Secondary | ICD-10-CM

## 2021-09-02 DIAGNOSIS — I10 Essential (primary) hypertension: Secondary | ICD-10-CM

## 2021-09-02 NOTE — Progress Notes (Signed)
MRN : [id number]  Tiffany Hawkins is a 76 y.o. ([birth year]) female who presents with chief complaint of  Chief Complaint  Patient presents with   Follow-up    6 month follow up  .  History of Present Illness: Patient returns today in follow up of her pelvic congestion syndrome.  She is doing well today.  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  At this point, she really does not have any significant abdominal pain or previous symptoms of her pelvic congestion syndrome.  She is very pleased with the results.  She has no new complaints today.  Current Outpatient Medications  Medication Sig Dispense Refill   albuterol (VENTOLIN HFA) 108 (90 Base) MCG/ACT inhaler Inhale 2 puffs into the lungs every 6 (six) hours as needed for wheezing or shortness of breath. 8 g 0   amLODipine (NORVASC) 5 MG tablet TAKE 2 TABLETS DAILY. 180 tablet 1   Blood Glucose Monitoring Suppl (TRUE METRIX METER) w/Device KIT USE AS DIRECTED 1 kit 0   cetirizine (ZYRTEC) 10 MG chewable tablet Chew 1 tablet (10 mg total) by mouth daily. 90 tablet 1   chlorpheniramine-HYDROcodone (TUSSIONEX PENNKINETIC ER) 10-8 MG/5ML Take 5 mLs by mouth every 12 (twelve) hours as needed for cough. 140 mL 0   citalopram (CELEXA) 20 MG tablet TAKE 2 TABLETS DAILY FOR DEPRESSION. 180 tablet 1   Dapagliflozin Propanediol (FARXIGA PO) Take by mouth.     fenofibrate 54 MG tablet TAKE 1 TABLET EVERY DAY 90 tablet 1   fluticasone (FLONASE) 50 MCG/ACT nasal spray Place 2 sprays into both nostrils daily. 48 g 1   ibuprofen (ADVIL) 800 MG tablet Take 1 tablet (800 mg total) by mouth every 8 (eight) hours as needed. 90 tablet 1   insulin aspart protamine - aspart (NOVOLOG MIX 70/30 FLEXPEN) (70-30) 100 UNIT/ML FlexPen Inject 0.35 mLs (35 Units total) into the skin 2 (two) times daily. Inject 35 units [place] QAM. Gradually increase to 40units [place] QPM 15  mL 11   lovastatin (MEVACOR) 20 MG tablet TAKE 1 TABLET AT BEDTIME FOR HIGH CHOLESTEROL 90 tablet 1   Multiple Vitamin (MULTIVITAMIN) tablet Take by mouth daily. TAKES 1/2 TABLET     pantoprazole (PROTONIX) 20 MG tablet TAKE 2 TABLETS EVERY DAY 180 tablet 1   sharps container 1 each by Does not apply route as needed. To use for insulin syringes and needles. Taking insulin at least twice daily.  E11.65 1 each 5   spironolactone (ALDACTONE) 100 MG tablet Take 100 mg by mouth daily. 2 tablets once a day     TRUE METRIX BLOOD GLUCOSE TEST test strip TEST BLOOD SUGAR THREE TIMES DAILY AFTER MEALS 300 strip 3   TRUEplus Lancets 33G MISC Use as directed three times a day E11.65 300 each 3   No current facility-administered medications for this visit.    Past Medical History:  Diagnosis Date   Anxiety    Colon cancer ([place]) 10/12/2014   Stage IIB; had chemo   Diabetes mellitus without complication (HCC)    Hyperlipidemia    Hypertension    Lateral epicondylitis    Personal history of chemotherapy    Polio    childhood   Skin cancer    Sqamous Cell, Lt Calf   Sleep apnea    no CPAP, sx improved with wt loss    Past Surgical History:  Procedure  Laterality Date   APPENDECTOMY     BACK SURGERY     CATARACT EXTRACTION W/PHACO Left 12/03/2017   Procedure: CATARACT EXTRACTION PHACO AND INTRAOCULAR LENS PLACEMENT ([place]) LEFT  DIABETIC;  Surgeon: Eulogio Bear, MD;  Location: [place];  Service: Ophthalmology;  Laterality: Left;  Diabetic - insulin   CATARACT EXTRACTION W/PHACO Right 01/14/2018   Procedure: CATARACT EXTRACTION PHACO AND INTRAOCULAR LENS PLACEMENT ([place]) RIGHT DIABETIC;  Surgeon: Eulogio Bear, MD;  Location: [place];  Service: Ophthalmology;  Laterality: Right;  Diabetic - insulin   CERVICAL DISC SURGERY     CHOLECYSTECTOMY     COLON SURGERY     COLONOSCOPY     COLONOSCOPY WITH PROPOFOL N/A 05/17/2015   Procedure: COLONOSCOPY WITH PROPOFOL;   Surgeon: Manya Silvas, MD;  Location: [facility] ENDOSCOPY;  Service: Endoscopy;  Laterality: N/A;   COLONOSCOPY WITH PROPOFOL N/A 04/01/2018   Procedure: COLONOSCOPY WITH BIOPSY;  Surgeon: [name], MD;  Location: [place];  Service: Endoscopy;  Laterality: N/A;  Diabetic - insulin   EMBOLIZATION N/A 01/24/2021   Procedure: EMBOLIZATION;  Surgeon: Algernon Huxley, MD;  Location: [place] [facility] LAB;  Service: Cardiovascular;  Laterality: N/A;   ESOPHAGOGASTRODUODENOSCOPY     HERNIA REPAIR     LOWER EXTREMITY VENOGRAPHY Left 01/24/2021   Procedure: LOWER EXTREMITY VENOGRAPHY;  Surgeon: Algernon Huxley, MD;  Location: [place] CV LAB;  Service: Cardiovascular;  Laterality: Left;   POLYPECTOMY N/A 04/01/2018   Procedure: POLYPECTOMY;  Surgeon: [name], MD;  Location: [place];  Service: Endoscopy;  Laterality: N/A;   PORT A CATH INJECTION ([place] [facility]X)     Port has been removed   TUBAL LIGATION       Social History   Tobacco Use   Smoking status: Former    Packs/day: 1.50    Years: 20.00    Pack years: 30.00    Types: Cigarettes    Quit date: 04/17/1981    Years since quitting: 40.4   Smokeless tobacco: Never  Vaping Use   Vaping Use: Never used  Substance Use Topics   Alcohol use: No   Drug use: No       Family History  Problem Relation Age of Onset   Alzheimer's disease Mother    Heart disease Father    Colon cancer Cousin    Colon cancer Cousin    Cancer Cousin        Brain Tumor   Breast cancer Neg Hx      Allergies  Allergen Reactions   Lisinopril Cough     REVIEW OF SYSTEMS (Negative unless checked)  Constitutional: [] Weight loss  [] Fever  [] Chills Cardiac: [] Chest pain   [] Chest pressure   [] Palpitations   [] Shortness of breath when laying flat   [] Shortness of breath at rest   [] Shortness of breath with exertion. Vascular:  [] Pain in legs with walking   [] Pain in legs at rest   [] Pain in legs when laying flat   [] Claudication    [] Pain in feet when walking  [] Pain in feet at rest  [] Pain in feet when laying flat   [] History of DVT   [] Phlebitis   [] Swelling in legs   [] Varicose veins   [] Non-healing ulcers Pulmonary:   [] Uses home oxygen   [] Productive cough   [] Hemoptysis   [] Wheeze  [] COPD   [] Asthma Neurologic:  [] Dizziness  [] Blackouts   [] Seizures   [] History of stroke   [] History of  TIA  [] Aphasia   [] Temporary blindness   [] Dysphagia   [] Weakness or numbness in arms   [] Weakness or numbness in legs Musculoskeletal:  [x] Arthritis   [] Joint swelling   [x] Joint pain   [] Low back pain Hematologic:  [] Easy bruising  [] Easy bleeding   [] Hypercoagulable state   [] Anemic   Gastrointestinal:  [] Blood in stool   [] Vomiting blood  [] Gastroesophageal reflux/heartburn   [x] Abdominal pain Genitourinary:  [] Chronic kidney disease   [] Difficult urination  [] Frequent urination  [] Burning with urination   [] Hematuria Skin:  [] Rashes   [] Ulcers   [] Wounds Psychological:  [x] History of anxiety   []  History of major depression.  Physical Examination  BP 132/80 (BP Location: Right Arm)   Pulse 82   Resp 16   Wt 153 lb 9.6 oz (69.7 kg)   BMI 28.55 kg/m  Gen:  WD/WN, NAD Head: [place]/AT, No temporalis wasting. Ear/Nose/Throat: Hearing grossly intact, nares w/o erythema or drainage Eyes: Conjunctiva clear. Sclera non-icteric Neck: Supple.  Trachea midline Pulmonary:  Good air movement, no use of accessory muscles.  Cardiac: RRR, no JVD Vascular:  Vessel Right Left  Radial Palpable Palpable                                   Gastrointestinal: soft, non-tender/non-distended. No guarding/reflex.  Musculoskeletal: M/S 5/5 throughout.  No deformity or atrophy.  No significant lower extremity edema. Neurologic: Sensation grossly intact in extremities.  Symmetrical.  Speech is fluent.  Psychiatric: Judgment intact, Mood & affect appropriate for pt's clinical situation. Dermatologic: No rashes or ulcers noted.  No cellulitis or  open wounds.      Labs Recent Results (from the past 2160 hour(s))  POCT HgB A1C     Status: Abnormal   Collection Time: 08/03/21  1:38 PM  Result Value Ref Range   Hemoglobin A1C 6.8 (A) 4.0 - 5.6 %   HbA1c POC (<> result, manual entry)     HbA1c, POC (prediabetic range)     HbA1c, POC (controlled diabetic range)      Radiology No results found.  Assessment/Plan Essential hypertension blood pressure control important in reducing the progression of atherosclerotic disease. On appropriate oral medications.     Type 2 diabetes mellitus without complication, with long-term current use of insulin (HCC) blood glucose control important in reducing the progression of atherosclerotic disease. Also, involved in wound healing. On appropriate medications.     Mixed hyperlipidemia lipid control important in reducing the progression of atherosclerotic disease. Continue statin therapy   Aortic atherosclerosis (HCC) ABIs were normal when checked.  No current worrisome symptoms.   Female pelvic congestion syndrome Symptoms are markedly improved with no appreciable symptoms at this point after intervention last fall.  I discussed with her I am very pleased with her results and if she develops recurrent symptoms in the future, she will contact our office.  Otherwise I will see her back as needed.   Leotis Pain, MD  09/02/2021 9:50 AM    This note was created with Dragon medical transcription system.  Any errors from dictation are purely unintentional

## 2021-09-15 ENCOUNTER — Ambulatory Visit: Payer: Medicare HMO | Admitting: Physician Assistant

## 2021-09-15 ENCOUNTER — Encounter: Payer: Self-pay | Admitting: Physician Assistant

## 2021-09-15 VITALS — BP 126/77 | HR 96 | Temp 97.8°F | Resp 16 | Ht 61.5 in | Wt 153.0 lb

## 2021-09-15 DIAGNOSIS — I10 Essential (primary) hypertension: Secondary | ICD-10-CM

## 2021-09-15 DIAGNOSIS — Z7189 Other specified counseling: Secondary | ICD-10-CM

## 2021-09-15 DIAGNOSIS — G4733 Obstructive sleep apnea (adult) (pediatric): Secondary | ICD-10-CM

## 2021-09-15 NOTE — Progress Notes (Signed)
[facility] [place], [place] [address]  Pulmonary Sleep Medicine   Office Visit Note  Patient Name: Tiffany Hawkins DOB: Jan 26, 1946 MRN [id number]  Date of Service: 09/15/2021  Complaints/HPI: Pt is here for routine pulmonary follow up for cpap supply renewal. Has not had supplies for the past 3 weeks and hasn't been able to use it due to excessive leak waking her. Was doing well with it prior. Recent download shows 81% compliance with AHI well controlled at 2.6/hr. Denies any SOB, dryness, or headaches. Sugar has been higher since being off cpap too and is hoping to get back to using it as soon as possible. Tolerates cpap and benefits from use.  ROS  General: (-) fever, (-) chills, (-) night sweats, (-) weakness Skin: (-) rashes, (-) itching,. Eyes: (-) visual changes, (-) redness, (-) itching. Nose and Sinuses: (-) nasal stuffiness or itchiness, (-) postnasal drip, (-) nosebleeds, (-) sinus trouble. Mouth and Throat: (-) sore throat, (-) hoarseness. Neck: (-) swollen glands, (-) enlarged thyroid, (-) neck pain. Respiratory: - cough, (-) bloody sputum, - shortness of breath, - wheezing. Cardiovascular: - ankle swelling, (-) chest pain. Lymphatic: (-) lymph node enlargement. Neurologic: (-) numbness, (-) tingling. Psychiatric: (-) anxiety, (-) depression   Current Medication: Outpatient Encounter Medications as of 09/15/2021  Medication Sig   albuterol (VENTOLIN HFA) 108 (90 Base) MCG/ACT inhaler Inhale 2 puffs into the lungs every 6 (six) hours as needed for wheezing or shortness of breath.   amLODipine (NORVASC) 5 MG tablet TAKE 2 TABLETS DAILY.   Blood Glucose Monitoring Suppl (TRUE METRIX METER) w/Device KIT USE AS DIRECTED   cetirizine (ZYRTEC) 10 MG chewable tablet Chew 1 tablet (10 mg total) by mouth daily.   chlorpheniramine-HYDROcodone (TUSSIONEX PENNKINETIC ER) 10-8 MG/5ML Take 5 mLs by mouth every 12 (twelve) hours as needed for cough.    citalopram (CELEXA) 20 MG tablet TAKE 2 TABLETS DAILY FOR DEPRESSION.   Dapagliflozin Propanediol (FARXIGA PO) Take by mouth.   fenofibrate 54 MG tablet TAKE 1 TABLET EVERY DAY   fluticasone (FLONASE) 50 MCG/ACT nasal spray Place 2 sprays into both nostrils daily.   ibuprofen (ADVIL) 800 MG tablet Take 1 tablet (800 mg total) by mouth every 8 (eight) hours as needed.   insulin aspart protamine - aspart (NOVOLOG MIX 70/30 FLEXPEN) (70-30) 100 UNIT/ML FlexPen Inject 0.35 mLs (35 Units total) into the skin 2 (two) times daily. Inject 35 units [place] QAM. Gradually increase to 40units [place] QPM   lovastatin (MEVACOR) 20 MG tablet TAKE 1 TABLET AT BEDTIME FOR HIGH CHOLESTEROL   Multiple Vitamin (MULTIVITAMIN) tablet Take by mouth daily. TAKES 1/2 TABLET   pantoprazole (PROTONIX) 20 MG tablet TAKE 2 TABLETS EVERY DAY   sharps container 1 each by Does not apply route as needed. To use for insulin syringes and needles. Taking insulin at least twice daily.  E11.65   spironolactone (ALDACTONE) 100 MG tablet Take 100 mg by mouth daily. 2 tablets once a day   TRUE METRIX BLOOD GLUCOSE TEST test strip TEST BLOOD SUGAR THREE TIMES DAILY AFTER MEALS   TRUEplus Lancets 33G MISC Use as directed three times a day E11.65   No facility-administered encounter medications on file as of 09/15/2021.    Surgical History: Past Surgical History:  Procedure Laterality Date   APPENDECTOMY     BACK SURGERY     CATARACT EXTRACTION W/PHACO Left 12/03/2017   Procedure: CATARACT EXTRACTION PHACO AND INTRAOCULAR LENS PLACEMENT (IOC) LEFT  DIABETIC;  Surgeon: Eulogio Bear, MD;  Location: [place];  Service: Ophthalmology;  Laterality: Left;  Diabetic - insulin   CATARACT EXTRACTION W/PHACO Right 01/14/2018   Procedure: CATARACT EXTRACTION PHACO AND INTRAOCULAR LENS PLACEMENT (IOC) RIGHT DIABETIC;  Surgeon: Eulogio Bear, MD;  Location: [place];  Service: Ophthalmology;  Laterality: Right;  Diabetic -  insulin   CERVICAL DISC SURGERY     CHOLECYSTECTOMY     COLON SURGERY     COLONOSCOPY     COLONOSCOPY WITH PROPOFOL N/A 05/17/2015   Procedure: COLONOSCOPY WITH PROPOFOL;  Surgeon: Manya Silvas, MD;  Location: [facility] ENDOSCOPY;  Service: Endoscopy;  Laterality: N/A;   COLONOSCOPY WITH PROPOFOL N/A 04/01/2018   Procedure: COLONOSCOPY WITH BIOPSY;  Surgeon: [name], MD;  Location: [place];  Service: Endoscopy;  Laterality: N/A;  Diabetic - insulin   EMBOLIZATION N/A 01/24/2021   Procedure: EMBOLIZATION;  Surgeon: Algernon Huxley, MD;  Location: [place] CV LAB;  Service: Cardiovascular;  Laterality: N/A;   ESOPHAGOGASTRODUODENOSCOPY     HERNIA REPAIR     LOWER EXTREMITY VENOGRAPHY Left 01/24/2021   Procedure: LOWER EXTREMITY VENOGRAPHY;  Surgeon: Algernon Huxley, MD;  Location: [place] CV LAB;  Service: Cardiovascular;  Laterality: Left;   POLYPECTOMY N/A 04/01/2018   Procedure: POLYPECTOMY;  Surgeon: [name], MD;  Location: [place];  Service: Endoscopy;  Laterality: N/A;   PORT A CATH INJECTION ([place] HX)     Port has been removed   TUBAL LIGATION      Medical History: Past Medical History:  Diagnosis Date   Anxiety    Colon cancer ([place]) 10/12/2014   Stage IIB; had chemo   Diabetes mellitus without complication (HCC)    Hyperlipidemia    Hypertension    Lateral epicondylitis    Personal history of chemotherapy    Polio    childhood   Skin cancer    Sqamous Cell, Lt Calf   Sleep apnea    no CPAP, sx improved with wt loss    Family History: Family History  Problem Relation Age of Onset   Alzheimer's disease Mother    Heart disease Father    Colon cancer Cousin    Colon cancer Cousin    Cancer Cousin        Brain Tumor   Breast cancer Neg Hx     Social History: Social History   Socioeconomic History   Marital status: Married    Spouse name: Not on file   Number of children: Not on file   Years of education: Not on file    Highest education level: Not on file  Occupational History   Not on file  Tobacco Use   Smoking status: Former    Packs/day: 1.50    Years: 20.00    Pack years: 30.00    Types: Cigarettes    Quit date: 04/17/1981    Years since quitting: 40.4   Smokeless tobacco: Never  Vaping Use   Vaping Use: Never used  Substance and Sexual Activity   Alcohol use: No   Drug use: No   Sexual activity: Yes  Other Topics Concern   Not on file  Social History Narrative   Not on file   Social Determinants of Health   Financial Resource Strain: Low Risk    Difficulty of Paying Living Expenses: Not hard at all  Food Insecurity: Not on file  Transportation Needs: No Transportation Needs   Lack of Transportation (  Medical): No   Lack of Transportation (Non-Medical): No  Physical Activity: Not on file  Stress: Not on file  Social Connections: Not on file  Intimate Partner Violence: Not on file    Vital Signs: Blood pressure 126/77, pulse 96, temperature 97.8 F (36.6 C), resp. rate 16, height 5' 1.5" (1.562 m), weight 153 lb (69.4 kg), SpO2 97 %.  Examination: General Appearance: The patient is well-developed, well-nourished, and in no distress. Skin: Gross inspection of skin unremarkable. Head: normocephalic, no gross deformities. Eyes: no gross deformities noted. ENT: ears appear grossly normal no exudates. Neck: Supple. No thyromegaly. No LAD. Respiratory: Lungs clear to auscultation bilaterally. Cardiovascular: Normal S1 and S2 without murmur or rub. Extremities: No cyanosis. pulses are equal. Neurologic: Alert and oriented. No involuntary movements.  LABS: Recent Results (from the past 2160 hour(s))  POCT HgB A1C     Status: Abnormal   Collection Time: 08/03/21  1:38 PM  Result Value Ref Range   Hemoglobin A1C 6.8 (A) 4.0 - 5.6 %   HbA1c POC (<> result, manual entry)     HbA1c, POC (prediabetic range)     HbA1c, POC (controlled diabetic range)      Radiology: PERIPHERAL  VASCULAR CATHETERIZATION  Result Date: 01/24/2021 See surgical note for result.   No results found.  No results found.    Assessment and Plan: Patient Active Problem List   Diagnosis Date Noted   Aortic atherosclerosis ([place]) 12/29/2020   Deformity of toe of left foot 03/28/2020   H/O multiple pulmonary nodules 09/22/2019   Chronic cough 09/22/2019   Perennial allergic rhinitis 09/17/2019   Acute upper respiratory infection 08/28/2019   Weakness 08/28/2019   Acute bronchitis 06/01/2019   Cough productive of yellow sputum 06/01/2019   Vasomotor rhinitis 05/01/2019   Chronic kidney disease, stage II (mild) 03/14/2019   Gastroesophageal reflux disease without esophagitis 03/14/2019   Intractable episodic headache 03/14/2019   Need for prophylactic vaccination with combined diphtheria-tetanus-pertussis (DTaP) vaccine 03/14/2019   Renal cyst 11/27/2018   Abnormality of gait and mobility 11/27/2018   Hepatic steatosis 10/31/2018   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[place]) 10/31/2018   Elevated ferritin 10/31/2018   Vertigo 08/13/2018   Gastroenteritis 05/09/2018   Diarrhea 05/09/2018   Nausea 05/09/2018   Personal history of colon cancer    Polyp of sigmoid colon    Dysuria 02/23/2018   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 02/10/2018   Encounter for general adult medical examination with abnormal findings 02/10/2018   Acute non-recurrent pansinusitis 08/24/2017   Other headache syndrome 08/24/2017   Uncontrolled type 2 diabetes mellitus with hyperglycemia ([place]) 08/24/2017   Type 2 diabetes mellitus without complication, with long-term current use of insulin ([place]) 08/22/2017   Need for vaccination against Streptococcus pneumoniae using pneumococcal conjugate vaccine 13 08/22/2017   Essential hypertension 05/03/2017   Otalgia, bilateral 05/02/2017   Type 2 diabetes mellitus with hyperglycemia ([place]) 05/02/2017   Mixed hyperlipidemia 05/02/2017   Right lower quadrant  pain 06/29/2016   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 Obstructive sleep apnea Will continue nightly use. Supply order sent - For home use only DME continuous positive airway pressure (CPAP)  2. CPAP use counseling CPAP couseling-Discussed importance of adequate CPAP use as well as proper care and cleaning techniques of machine and all supplies.  3. Essential hypertension Continue current  medication and f/u with PCP.    General Counseling: I have discussed the findings of the evaluation and examination with Ivin Booty.  I have also discussed any further diagnostic evaluation thatmay be needed or ordered today. Roland verbalizes understanding of the findings of todays visit. We also reviewed her medications today and discussed drug interactions and side effects including but not limited excessive drowsiness and altered mental states. We also discussed that there is always a risk not just to her but also people around her. she has been encouraged to call the office with any questions or concerns that should arise related to todays visit.  Orders Placed This Encounter  Procedures   For home use only DME continuous positive airway pressure (CPAP)    Supplies only    Order Specific Question:   Length of Need    Answer:   Lifetime    Order Specific Question:   Patient has OSA or probable OSA    Answer:   Yes    Order Specific Question:   Is the patient currently using CPAP in the home    Answer:   Yes    Order Specific Question:   Settings    Answer:   Other see comments    Order Specific Question:   CPAP supplies needed    Answer:   Mask, headgear, cushions, filters, heated tubing and water chamber     Time spent: 30  I have personally obtained a history, examined the patient, evaluated laboratory and imaging results, formulated the assessment and plan and placed  orders. This patient was seen by Drema Dallas, PA-C in collaboration with Dr. Devona Konig as a part of collaborative care agreement.     Allyne Gee, MD [facility] Pulmonary and Critical Care Sleep medicine

## 2021-09-20 ENCOUNTER — Telehealth: Payer: Self-pay

## 2021-09-20 DIAGNOSIS — E119 Type 2 diabetes mellitus without complications: Secondary | ICD-10-CM | POA: Diagnosis not present

## 2021-09-20 NOTE — Telephone Encounter (Signed)
Spoke to pt and informed her that her meds are here for pick up. Pt has an appt with Claiborne Billings and will be picking them up on Wednesday.

## 2021-09-21 ENCOUNTER — Ambulatory Visit (INDEPENDENT_AMBULATORY_CARE_PROVIDER_SITE_OTHER): Payer: Medicare HMO

## 2021-09-21 DIAGNOSIS — G4733 Obstructive sleep apnea (adult) (pediatric): Secondary | ICD-10-CM

## 2021-09-21 NOTE — Progress Notes (Signed)
She came in for mask fit. I used a [employer] n20 sm for her. And advised she has a 30 day mask guarentee.  Pt was seen by Claiborne Billings  RRT/RCP  from [facility]

## 2021-10-07 ENCOUNTER — Ambulatory Visit: Payer: Medicare HMO | Admitting: Nurse Practitioner

## 2021-10-07 ENCOUNTER — Other Ambulatory Visit: Payer: Medicare HMO

## 2021-10-13 ENCOUNTER — Encounter: Payer: Self-pay | Admitting: Nurse Practitioner

## 2021-10-19 ENCOUNTER — Ambulatory Visit (INDEPENDENT_AMBULATORY_CARE_PROVIDER_SITE_OTHER): Payer: Medicare HMO

## 2021-10-19 DIAGNOSIS — G4733 Obstructive sleep apnea (adult) (pediatric): Secondary | ICD-10-CM | POA: Diagnosis not present

## 2021-10-19 NOTE — Progress Notes (Signed)
95 percentile pressure 12   95th percentile leak 10.8   apnea index 2.4 /hr  apnea-hypopnea index  2.7 /hr   total days used  >4 hr 49 days  total days used <4 hr 12 days  Total compliance 54 percent  She is doing good and doing great since receiving new mask at 87%   Pt was seen by Claiborne Billings  RRT/RCP  from [facility]

## 2021-10-25 ENCOUNTER — Other Ambulatory Visit: Payer: Medicare HMO

## 2021-10-25 DIAGNOSIS — Z872 Personal history of diseases of the skin and subcutaneous tissue: Secondary | ICD-10-CM | POA: Diagnosis not present

## 2021-10-25 DIAGNOSIS — L680 Hirsutism: Secondary | ICD-10-CM | POA: Diagnosis not present

## 2021-10-25 DIAGNOSIS — L578 Other skin changes due to chronic exposure to nonionizing radiation: Secondary | ICD-10-CM | POA: Diagnosis not present

## 2021-10-25 DIAGNOSIS — L918 Other hypertrophic disorders of the skin: Secondary | ICD-10-CM | POA: Diagnosis not present

## 2021-10-25 DIAGNOSIS — Z859 Personal history of malignant neoplasm, unspecified: Secondary | ICD-10-CM | POA: Diagnosis not present

## 2021-10-25 DIAGNOSIS — L821 Other seborrheic keratosis: Secondary | ICD-10-CM | POA: Diagnosis not present

## 2021-10-25 DIAGNOSIS — L720 Epidermal cyst: Secondary | ICD-10-CM | POA: Diagnosis not present

## 2021-10-27 ENCOUNTER — Ambulatory Visit: Payer: Medicare HMO | Admitting: Oncology

## 2021-11-09 ENCOUNTER — Other Ambulatory Visit: Payer: Self-pay | Admitting: Nurse Practitioner

## 2021-11-09 ENCOUNTER — Encounter: Payer: Self-pay | Admitting: Nurse Practitioner

## 2021-11-09 ENCOUNTER — Telehealth: Payer: Self-pay

## 2021-11-09 ENCOUNTER — Other Ambulatory Visit: Payer: Self-pay

## 2021-11-09 ENCOUNTER — Ambulatory Visit (INDEPENDENT_AMBULATORY_CARE_PROVIDER_SITE_OTHER): Payer: Medicare HMO | Admitting: Nurse Practitioner

## 2021-11-09 VITALS — BP 121/65 | HR 83 | Temp 98.6°F | Resp 16 | Ht 61.5 in | Wt 156.0 lb

## 2021-11-09 DIAGNOSIS — R29898 Other symptoms and signs involving the musculoskeletal system: Secondary | ICD-10-CM

## 2021-11-09 DIAGNOSIS — B91 Sequelae of poliomyelitis: Secondary | ICD-10-CM | POA: Diagnosis not present

## 2021-11-09 DIAGNOSIS — E119 Type 2 diabetes mellitus without complications: Secondary | ICD-10-CM | POA: Diagnosis not present

## 2021-11-09 DIAGNOSIS — Z760 Encounter for issue of repeat prescription: Secondary | ICD-10-CM | POA: Diagnosis not present

## 2021-11-09 DIAGNOSIS — M8960 Osteopathy after poliomyelitis, unspecified site: Secondary | ICD-10-CM | POA: Diagnosis not present

## 2021-11-09 DIAGNOSIS — Z794 Long term (current) use of insulin: Secondary | ICD-10-CM | POA: Diagnosis not present

## 2021-11-09 DIAGNOSIS — I10 Essential (primary) hypertension: Secondary | ICD-10-CM

## 2021-11-09 DIAGNOSIS — K219 Gastro-esophageal reflux disease without esophagitis: Secondary | ICD-10-CM

## 2021-11-09 DIAGNOSIS — E1165 Type 2 diabetes mellitus with hyperglycemia: Secondary | ICD-10-CM | POA: Diagnosis not present

## 2021-11-09 LAB — POCT GLYCOSYLATED HEMOGLOBIN (HGB A1C): Hemoglobin A1C: 7.6 % — AB (ref 4.0–5.6)

## 2021-11-09 MED ORDER — FREESTYLE LIBRE 2 SENSOR MISC: 1.0000 | 2 refills | Status: DC

## 2021-11-09 MED ORDER — DEXCOM G7 SENSOR MISC: 3 refills | Status: DC

## 2021-11-09 MED ORDER — PANTOPRAZOLE SODIUM 20 MG PO TBEC: 40.0000 mg | DELAYED_RELEASE_TABLET | Freq: Every day | ORAL | 3 refills | Status: DC

## 2021-11-09 MED ORDER — AMLODIPINE BESYLATE 5 MG PO TABS
10.0000 mg | ORAL_TABLET | Freq: Every day | ORAL | 3 refills | Status: DC
Start: 2021-11-09 — End: 2022-10-04

## 2021-11-09 MED ORDER — FREESTYLE LIBRE 2 READER DEVI: 1.0000 | Freq: Once | 0 refills | Status: DC

## 2021-11-09 MED ORDER — DEXCOM G7 RECEIVER DEVI: 0 refills | Status: DC

## 2021-11-09 MED ORDER — LOVASTATIN 20 MG PO TABS: ORAL_TABLET | ORAL | 1 refills | Status: DC

## 2021-11-09 MED ORDER — NOVOLOG MIX 70/30 FLEXPEN (70-30) 100 UNIT/ML ~~LOC~~ SUPN: PEN_INJECTOR | SUBCUTANEOUS | 11 refills | Status: AC

## 2021-11-09 MED ORDER — CITALOPRAM HYDROBROMIDE 20 MG PO TABS
40.0000 mg | ORAL_TABLET | Freq: Every day | ORAL | 3 refills | Status: DC
Start: 2021-11-09 — End: 2022-01-18

## 2021-11-09 NOTE — Telephone Encounter (Signed)
Spoke with phar they will re run dexcom 7 after pt will submit medicare B card  and also spoke pt tat if she can call phar give them all information

## 2021-11-09 NOTE — Progress Notes (Signed)
[facility] [place], [place] [address]  Internal MEDICINE  Office Visit Note  Patient Name: Tiffany Hawkins  [id number]  [id number]  Date of Service: 11/09/2021  Chief Complaint  Patient presents with   Follow-up    Discuss balance, wants sensor for diabetes   Sleep Apnea   Hyperlipidemia   Hypertension   Anxiety   Quality Metric Gaps    Check CPE, foot exam, pneumovax   Diabetes    HPI Tiffany Hawkins presents for a follow up visit for diabetes, hypertension, anxiety and lower extremity weakness. Diabetes --her A1c is elevated at 7.6 today, which is a significant increase from 6.8 in April this year.  She is interested in trying the CGM sensor such as freestyle libre 2 or Dexcom 7.  She does have difficulty checking her sugars at times and she is on insulin.  Her diet has not been ideal and she is aware of what diet modifications are needed.  The sensor will help her keep track of her sugar more closely which will help her in making meal choices throughout the day.  Current insulin dose is NovoLog 70/30 and she is taking 35 units in the morning and 40 units in the evening. Hypertension --blood pressure is well controlled on current medications Anxiety --anxiety level is controlled per patient report, no issues.  She is currently taking citalopram 40 mg daily. Bilateral leg weakness and impaired balance --patient was going to physical therapy frequently during the first half of the year.  She has a history of poliomyelitis and has had problems with leg weakness for a long time.  She is continuing to do stretches that she learned in physical therapy.  It may be necessary to repeat a course of physical therapy intermittently for this issue.  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    Current Medication: Outpatient Encounter Medications as of 11/09/2021   Medication Sig   albuterol (VENTOLIN HFA) 108 (90 Base) MCG/ACT inhaler Inhale 2 puffs into the lungs every 6 (six) hours as needed for wheezing or shortness of breath.   Blood Glucose Monitoring Suppl (TRUE METRIX METER) w/Device KIT USE AS DIRECTED   cetirizine (ZYRTEC) 10 MG chewable tablet Chew 1 tablet (10 mg total) by mouth daily.   Dapagliflozin Propanediol (FARXIGA PO) Take 10 mg by mouth daily.   fenofibrate 54 MG tablet TAKE 1 TABLET EVERY DAY   fluticasone (FLONASE) 50 MCG/ACT nasal spray Place 2 sprays into both nostrils daily.   ibuprofen (ADVIL) 800 MG tablet Take 1 tablet (800 mg total) by mouth every 8 (eight) hours as needed.   Multiple Vitamin (MULTIVITAMIN) tablet Take by mouth daily. TAKES 1/2 TABLET   sharps container 1 each by Does not apply route as needed. To use for insulin syringes and needles. Taking insulin at least twice daily.  E11.65   spironolactone (ALDACTONE) 100 MG tablet Take 100 mg by mouth daily. 2 tablets once a day   TRUE METRIX BLOOD GLUCOSE TEST test strip TEST BLOOD SUGAR THREE TIMES DAILY AFTER MEALS   TRUEplus Lancets 33G MISC Use as directed three times a day E11.65   [DISCONTINUED] amLODipine (NORVASC) 5 MG tablet TAKE 2 TABLETS DAILY.   [DISCONTINUED] chlorpheniramine-HYDROcodone (TUSSIONEX PENNKINETIC ER) 10-8 MG/5ML Take 5 mLs by mouth every 12 (twelve) hours as needed for cough.   [DISCONTINUED] citalopram (CELEXA) 20 MG tablet TAKE 2 TABLETS  DAILY FOR DEPRESSION.   [DISCONTINUED] Continuous Blood Gluc Receiver (FREESTYLE LIBRE 2 READER) DEVI 1 Device by Does not apply route once for 1 dose.   [DISCONTINUED] Continuous Blood Gluc Sensor (FREESTYLE LIBRE 2 SENSOR) MISC 1 Device by Does not apply route every 14 (fourteen) days.   [DISCONTINUED] insulin aspart protamine - aspart (NOVOLOG MIX 70/30 FLEXPEN) (70-30) 100 UNIT/ML FlexPen Inject 0.35 mLs (35 Units total) into the skin 2 (two) times daily. Inject 35 units [place] QAM. Gradually increase to  40units [place] QPM   [DISCONTINUED] lovastatin (MEVACOR) 20 MG tablet TAKE 1 TABLET AT BEDTIME FOR HIGH CHOLESTEROL   [DISCONTINUED] pantoprazole (PROTONIX) 20 MG tablet TAKE 2 TABLETS EVERY DAY   amLODipine (NORVASC) 5 MG tablet Take 2 tablets (10 mg total) by mouth daily.   citalopram (CELEXA) 20 MG tablet Take 2 tablets (40 mg total) by mouth daily. FOR DEPRESSION   insulin aspart protamine - aspart (NOVOLOG MIX 70/30 FLEXPEN) (70-30) 100 UNIT/ML FlexPen Inject 35 units [place] QAM and inject 40units [place] QPM   lovastatin (MEVACOR) 20 MG tablet TAKE 1 TABLET AT BEDTIME FOR HIGH CHOLESTEROL   pantoprazole (PROTONIX) 20 MG tablet Take 2 tablets (40 mg total) by mouth daily.   No facility-administered encounter medications on file as of 11/09/2021.    Surgical History: Past Surgical History:  Procedure Laterality Date   APPENDECTOMY     BACK SURGERY     CATARACT EXTRACTION W/PHACO Left 12/03/2017   Procedure: CATARACT EXTRACTION PHACO AND INTRAOCULAR LENS PLACEMENT ([place]) LEFT  DIABETIC;  Surgeon: Eulogio Bear, MD;  Location: [place];  Service: Ophthalmology;  Laterality: Left;  Diabetic - insulin   CATARACT EXTRACTION W/PHACO Right 01/14/2018   Procedure: CATARACT EXTRACTION PHACO AND INTRAOCULAR LENS PLACEMENT ([facility]) RIGHT DIABETIC;  Surgeon: Eulogio Bear, MD;  Location: [place];  Service: Ophthalmology;  Laterality: Right;  Diabetic - insulin   CERVICAL DISC SURGERY     CHOLECYSTECTOMY     COLON SURGERY     COLONOSCOPY     COLONOSCOPY WITH PROPOFOL N/A 05/17/2015   Procedure: COLONOSCOPY WITH PROPOFOL;  Surgeon: Manya Silvas, MD;  Location: [facility] ENDOSCOPY;  Service: Endoscopy;  Laterality: N/A;   COLONOSCOPY WITH PROPOFOL N/A 04/01/2018   Procedure: COLONOSCOPY WITH BIOPSY;  Surgeon: [name], MD;  Location: [place];  Service: Endoscopy;  Laterality: N/A;  Diabetic - insulin   EMBOLIZATION N/A 01/24/2021   Procedure: EMBOLIZATION;  Surgeon: Algernon Huxley, MD;  Location: [place] CV LAB;  Service: Cardiovascular;  Laterality: N/A;   ESOPHAGOGASTRODUODENOSCOPY     HERNIA REPAIR     LOWER EXTREMITY VENOGRAPHY Left 01/24/2021   Procedure: LOWER EXTREMITY VENOGRAPHY;  Surgeon: Algernon Huxley, MD;  Location: [place] CV LAB;  Service: Cardiovascular;  Laterality: Left;   POLYPECTOMY N/A 04/01/2018   Procedure: POLYPECTOMY;  Surgeon: [name], MD;  Location: [place];  Service: Endoscopy;  Laterality: N/A;   PORT A CATH INJECTION ([place] [facility])     Port has been removed   TUBAL LIGATION      Medical History: Past Medical History:  Diagnosis Date   Anxiety    Colon cancer ([place]) 10/12/2014   Stage IIB; had chemo   Diabetes mellitus without complication (HCC)    Hyperlipidemia    Hypertension    Lateral epicondylitis    Personal history of chemotherapy    Polio    childhood   Skin cancer    Sqamous Cell, Lt  Calf   Sleep apnea    no CPAP, sx improved with wt loss    Family History: Family History  Problem Relation Age of Onset   Alzheimer's disease Mother    Heart disease Father    Colon cancer Cousin    Colon cancer Cousin    Cancer Cousin        Brain Tumor   Breast cancer Neg Hx     Social History   Socioeconomic History   Marital status: Married    Spouse name: Not on file   Number of children: Not on file   Years of education: Not on file   Highest education level: Not on file  Occupational History   Not on file  Tobacco Use   Smoking status: Former    Packs/day: 1.50    Years: 20.00    Total pack years: 30.00    Types: Cigarettes    Quit date: 04/17/1981    Years since quitting: 40.7   Smokeless tobacco: Never  Vaping Use   Vaping Use: Never used  Substance and Sexual Activity   Alcohol use: No   Drug use: No   Sexual activity: Yes  Other Topics Concern   Not on file  Social History Narrative   Not on file   Social Determinants of Health   Financial Resource Strain: Low Risk   (06/22/2021)   Overall Financial Resource Strain (CARDIA)    Difficulty of Paying Living Expenses: Not hard at all  Food Insecurity: Not on file  Transportation Needs: No Transportation Needs (06/22/2021)   PRAPARE - Hydrologist (Medical): No    Lack of Transportation (Non-Medical): No  Physical Activity: Not on file  Stress: Not on file  Social Connections: Not on file  Intimate Partner Violence: Not on file      Review of Systems  Constitutional:  Negative for chills, fatigue and unexpected weight change.  HENT:  Negative for congestion, rhinorrhea, sneezing and sore throat.   Eyes:  Negative for redness.  Respiratory:  Negative for cough, chest tightness and shortness of breath.   Cardiovascular:  Negative for chest pain and palpitations.  Gastrointestinal:  Negative for abdominal pain, constipation, diarrhea, nausea and vomiting.  Genitourinary:  Negative for dysuria and frequency.  Musculoskeletal:  Negative for arthralgias, back pain, joint swelling and neck pain.       Bilateral lower extremity weakness reported as well as issues with balance  Skin:  Negative for rash.  Neurological:  Positive for weakness (Bilateral lower extremities reported). Negative for tremors and numbness.  Hematological:  Negative for adenopathy. Does not bruise/bleed easily.  Psychiatric/Behavioral:  Negative for behavioral problems (Depression), sleep disturbance and suicidal ideas. The patient is not nervous/anxious.     Vital Signs: BP 121/65   Pulse 83   Temp 98.6 F (37 C)   Resp 16   Ht 5' 1.5" (1.562 m)   Wt 156 lb (70.8 kg)   SpO2 97%   BMI 29.00 kg/m    Physical Exam Vitals reviewed.  Constitutional:      General: She is not in acute distress.    Appearance: Normal appearance. She is normal weight. She is not ill-appearing.  HENT:     Head: Normocephalic and atraumatic.  Eyes:     Pupils: Pupils are equal, round, and reactive to light.   Cardiovascular:     Rate and Rhythm: Normal rate and regular rhythm.  Pulmonary:     Effort:  Pulmonary effort is normal. No respiratory distress.  Neurological:     Mental Status: She is alert and oriented to person, place, and time.     Cranial Nerves: No cranial nerve deficit.     Coordination: Coordination normal.     Gait: Gait normal.  Psychiatric:        Mood and Affect: Mood normal.        Behavior: Behavior normal.        Assessment/Plan: 1. Uncontrolled type 2 diabetes mellitus with hyperglycemia (HCC) A1c was 7.6, significantly elevated compared to previous A1c in April.  Urine sent for microalbumin lab.  No change in current insulin dose and patient is also taking Farxiga 10 mg daily.  She is well aware of diet changes that are needed to help her A1c improve and we will repeat the A1c in 3 months.  If no improvement or worsening of her A1c at the next check in 3 months, will most likely need to adjust medication. - POCT HgB A1C - Microalbumin, urine - insulin aspart protamine - aspart (NOVOLOG MIX 70/30 FLEXPEN) (70-30) 100 UNIT/ML FlexPen; Inject 35 units [place] QAM and inject 40units [place] QPM  Dispense: 15 mL; Refill: 11  2. Essential hypertension Blood pressure is well controlled with current medications, no changes - amLODipine (NORVASC) 5 MG tablet; Take 2 tablets (10 mg total) by mouth daily.  Dispense: 180 tablet; Refill: 3  3. Weakness of both lower extremities Ongoing problem that she has been to physical therapy for, long-term issue after poliomyelitis.  Continue exercises learned in physical therapy.  Patient will call the clinic if any significant worsening of weakness is noted or if her balance is more severely affected  4. 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  She is completing physical therapy for now earlier this year.  Continues to report bilateral lower extremity weakness and also feels like her  balance is affected.  Is not significantly affected at this time per patient, she will call the clinic if it worsens to the point that she needs to see a specialist or possibly go back to physical therapy.  She is continuing to do her exercises that she learned in physical therapy as well.  5. Medication refill - lovastatin (MEVACOR) 20 MG tablet; TAKE 1 TABLET AT BEDTIME FOR HIGH CHOLESTEROL  Dispense: 90 tablet; Refill: 1 - insulin aspart protamine - aspart (NOVOLOG MIX 70/30 FLEXPEN) (70-30) 100 UNIT/ML FlexPen; Inject 35 units [place] QAM and inject 40units [place] QPM  Dispense: 15 mL; Refill: 11 - citalopram (CELEXA) 20 MG tablet; Take 2 tablets (40 mg total) by mouth daily. FOR DEPRESSION  Dispense: 180 tablet; Refill: 3 - pantoprazole (PROTONIX) 20 MG tablet; Take 2 tablets (40 mg total) by mouth daily.  Dispense: 180 tablet; Refill: 3 - amLODipine (NORVASC) 5 MG tablet; Take 2 tablets (10 mg total) by mouth daily.  Dispense: 180 tablet; Refill: 3   General Counseling: Tiffany Hawkins verbalizes understanding of the findings of todays visit and agrees with plan of treatment. I have discussed any further diagnostic evaluation that may be needed or ordered today. We also reviewed her medications today. she has been encouraged to call the office with any questions or concerns that should arise related to todays visit.    Orders Placed This Encounter  Procedures   Microalbumin, urine   POCT HgB A1C    Meds ordered this encounter  Medications   DISCONTD: Continuous Blood Gluc  Sensor (FREESTYLE LIBRE 2 SENSOR) MISC    Sig: 1 Device by Does not apply route every 14 (fourteen) days.    Dispense:  6 each    Refill:  2    E11.65 and patient is on insulin, may need PA, please let us know   DISCONTD: Continuous Blood Gluc Receiver (FREESTYLE LIBRE 2 READER) DEVI    Sig: 1 Device by Does not apply route once for 1 dose.    Dispense:  1 each    Refill:  0    E11.65 and is on insulin, may need PA, please let us  know   lovastatin (MEVACOR) 20 MG tablet    Sig: TAKE 1 TABLET AT BEDTIME FOR HIGH CHOLESTEROL    Dispense:  90 tablet    Refill:  1   insulin aspart protamine - aspart (NOVOLOG MIX 70/30 FLEXPEN) (70-30) 100 UNIT/ML FlexPen    Sig: Inject 35 units [place] QAM and inject 40units [place] QPM    Dispense:  15 mL    Refill:  11    Med provided by manufacturer.   citalopram (CELEXA) 20 MG tablet    Sig: Take 2 tablets (40 mg total) by mouth daily. FOR DEPRESSION    Dispense:  180 tablet    Refill:  3    For future refills   pantoprazole (PROTONIX) 20 MG tablet    Sig: Take 2 tablets (40 mg total) by mouth daily.    Dispense:  180 tablet    Refill:  3    For future refills   amLODipine (NORVASC) 5 MG tablet    Sig: Take 2 tablets (10 mg total) by mouth daily.    Dispense:  180 tablet    Refill:  3    For future refills    Return for previously scheduled, CPE, Rasheka Denard PCP in november, also repeat a1c then.   Total time spent:30 Minutes Time spent includes review of chart, medications, test results, and follow up plan with the patient.   [place] Controlled Substance Database was reviewed by me.  This patient was seen by Jonetta Osgood, FNP-C in collaboration with Dr. Clayborn Bigness as a part of collaborative care agreement.   Hani Campusano R. Valetta Fuller, MSN, FNP-C Internal medicine

## 2021-11-10 LAB — MICROALBUMIN, URINE: Microalbumin, Urine: 44.1 ug/mL

## 2021-11-14 DIAGNOSIS — L700 Acne vulgaris: Secondary | ICD-10-CM | POA: Diagnosis not present

## 2021-12-19 ENCOUNTER — Encounter: Payer: Self-pay | Admitting: Nurse Practitioner

## 2021-12-21 ENCOUNTER — Telehealth: Payer: Medicare HMO

## 2021-12-27 ENCOUNTER — Telehealth: Payer: Self-pay

## 2021-12-27 NOTE — Telephone Encounter (Signed)
Spoke with patient to let them know that Patient Assistance Novolog pens are ready for pick up.

## 2022-01-18 ENCOUNTER — Other Ambulatory Visit: Payer: Self-pay

## 2022-01-18 MED ORDER — CITALOPRAM HYDROBROMIDE 40 MG PO TABS: 40.0000 mg | ORAL_TABLET | Freq: Every day | ORAL | 1 refills | Status: DC

## 2022-01-18 NOTE — Telephone Encounter (Signed)
Pt called and needed refills on her farxiga which is on pt asst program and also celexa.  Pt has been out of her celexa for a week or longer.  She called and spoke to [facility] to see why they haven't sent her medication and she advised that they needed clarification on the mg dose (40 mg).  Per Alyssa pt is to take 40 mg daily.  I sent in 40 mg tablet and d/c'd the 20 mg take 2 tablets once daily prescription.  I sent a 30 day rx to [facility] in [place] and also a 90 day to [facility].   I placed 2 boxes of farxiga 10 mg samples up front along with two envelopes with pt asst applications for [employer]

## 2022-01-19 ENCOUNTER — Other Ambulatory Visit: Payer: Self-pay

## 2022-01-19 MED ORDER — DEXCOM G7 SENSOR MISC: 3 refills | Status: DC

## 2022-01-19 MED ORDER — DEXCOM G7 RECEIVER DEVI: 0 refills | Status: DC

## 2022-02-08 ENCOUNTER — Ambulatory Visit (INDEPENDENT_AMBULATORY_CARE_PROVIDER_SITE_OTHER): Payer: Medicare HMO | Admitting: Nurse Practitioner

## 2022-02-08 ENCOUNTER — Encounter: Payer: Self-pay | Admitting: Nurse Practitioner

## 2022-02-08 VITALS — BP 132/70 | HR 95 | Temp 98.4°F | Resp 16 | Ht 61.5 in | Wt 156.0 lb

## 2022-02-08 DIAGNOSIS — E1165 Type 2 diabetes mellitus with hyperglycemia: Secondary | ICD-10-CM

## 2022-02-08 DIAGNOSIS — I10 Essential (primary) hypertension: Secondary | ICD-10-CM

## 2022-02-08 DIAGNOSIS — R635 Abnormal weight gain: Secondary | ICD-10-CM

## 2022-02-08 LAB — POCT GLYCOSYLATED HEMOGLOBIN (HGB A1C): Hemoglobin A1C: 7 % — AB (ref 4.0–5.6)

## 2022-02-08 MED ORDER — GLIPIZIDE ER 2.5 MG PO TB24: 2.5000 mg | ORAL_TABLET | Freq: Every day | ORAL | 2 refills | Status: DC

## 2022-02-08 NOTE — Progress Notes (Signed)
[facility] [address] [place], [place] [address]  Internal MEDICINE  Office Visit Note  Patient Name: Tiffany Hawkins  [id number]  [id number]  Date of Service: 02/08/2022  Chief Complaint  Patient presents with   Follow-up   Hypertension   Diabetes   Hyperlipidemia    HPI Tiffany Hawkins presents for a follow up visit for weight loss management and diabetes.  No weight gain and a1c is the same at 7.0 Back is bothering her some  179-200  Feeling wobbly   Current Medication: Outpatient Encounter Medications as of 02/08/2022  Medication Sig   albuterol (VENTOLIN HFA) 108 (90 Base) MCG/ACT inhaler Inhale 2 puffs into the lungs every 6 (six) hours as needed for wheezing or shortness of breath.   amLODipine (NORVASC) 5 MG tablet Take 2 tablets (10 mg total) by mouth daily.   Blood Glucose Monitoring Suppl (TRUE METRIX METER) w/Device KIT USE AS DIRECTED   cetirizine (ZYRTEC) 10 MG chewable tablet Chew 1 tablet (10 mg total) by mouth daily.   citalopram (CELEXA) 40 MG tablet Take 1 tablet (40 mg total) by mouth daily.   Dapagliflozin Propanediol (FARXIGA PO) Take 10 mg by mouth daily.   glipiZIDE (GLUCOTROL XL) 2.5 MG 24 hr tablet Take 1 tablet (2.5 mg total) by mouth daily with breakfast.   ibuprofen (ADVIL) 800 MG tablet Take 1 tablet (800 mg total) by mouth every 8 (eight) hours as needed.   insulin aspart protamine - aspart (NOVOLOG MIX 70/30 FLEXPEN) (70-30) 100 UNIT/ML FlexPen Inject 35 units Cantril QAM and inject 40units Ector QPM   lovastatin (MEVACOR) 20 MG tablet TAKE 1 TABLET AT BEDTIME FOR HIGH CHOLESTEROL   Multiple Vitamin (MULTIVITAMIN) tablet Take by mouth daily. TAKES 1/2 TABLET   pantoprazole (PROTONIX) 20 MG tablet Take 2 tablets (40 mg total) by mouth daily.   sharps container 1 each by Does not apply route as needed. To use for insulin syringes and needles. Taking insulin at least twice daily.  E11.65   spironolactone (ALDACTONE) 100 MG tablet Take 100 mg by  mouth daily. 2 tablets once a day   TRUE METRIX BLOOD GLUCOSE TEST test strip TEST BLOOD SUGAR THREE TIMES DAILY AFTER MEALS   [DISCONTINUED] Continuous Blood Gluc Receiver (DEXCOM G7 RECEIVER) DEVI Use as directed one 1 year DXE11.65   [DISCONTINUED] Continuous Blood Gluc Sensor (DEXCOM G7 SENSOR) MISC Use as directed every 10 days Dx E11.65   [DISCONTINUED] fenofibrate 54 MG tablet TAKE 1 TABLET EVERY DAY   [DISCONTINUED] fluticasone (FLONASE) 50 MCG/ACT nasal spray Place 2 sprays into both nostrils daily.   [DISCONTINUED] lovastatin (MEVACOR) 20 MG tablet TAKE 1 TABLET AT BEDTIME FOR HIGH CHOLESTEROL   [DISCONTINUED] TRUEplus Lancets 33G MISC Use as directed three times a day E11.65   No facility-administered encounter medications on file as of 02/08/2022.    Surgical History: Past Surgical History:  Procedure Laterality Date   APPENDECTOMY     BACK SURGERY     CATARACT EXTRACTION W/PHACO Left 12/03/2017   Procedure: CATARACT EXTRACTION PHACO AND INTRAOCULAR LENS PLACEMENT (IOC) LEFT  DIABETIC;  Surgeon: Nevada Crane, MD;  Location: [facility];  Service: Ophthalmology;  Laterality: Left;  Diabetic - insulin   CATARACT EXTRACTION W/PHACO Right 01/14/2018   Procedure: CATARACT EXTRACTION PHACO AND INTRAOCULAR LENS PLACEMENT (IOC) RIGHT DIABETIC;  Surgeon: Nevada Crane, MD;  Location: [facility];  Service: Ophthalmology;  Laterality: Right;  Diabetic - insulin   CERVICAL DISC SURGERY  CHOLECYSTECTOMY     COLON SURGERY     COLONOSCOPY     COLONOSCOPY WITH PROPOFOL N/A 05/17/2015   Procedure: COLONOSCOPY WITH PROPOFOL;  Surgeon: Manya Silvas, MD;  Location: [facility];  Service: Endoscopy;  Laterality: N/A;   COLONOSCOPY WITH PROPOFOL N/A 04/01/2018   Procedure: COLONOSCOPY WITH BIOPSY;  Surgeon: [name], MD;  Location: [facility];  Service: Endoscopy;  Laterality: N/A;  Diabetic - insulin   EMBOLIZATION N/A 01/24/2021   Procedure:  EMBOLIZATION;  Surgeon: Algernon Huxley, MD;  Location: [facility];  Service: Cardiovascular;  Laterality: N/A;   ESOPHAGOGASTRODUODENOSCOPY     HERNIA REPAIR     LOWER EXTREMITY VENOGRAPHY Left 01/24/2021   Procedure: LOWER EXTREMITY VENOGRAPHY;  Surgeon: Algernon Huxley, MD;  Location: [place] [facility];  Service: Cardiovascular;  Laterality: Left;   POLYPECTOMY N/A 04/01/2018   Procedure: POLYPECTOMY;  Surgeon: [name], MD;  Location: [facility];  Service: Endoscopy;  Laterality: N/A;   PORT A CATH INJECTION ([place] [facility])     Port has been removed   TUBAL LIGATION      Medical History: Past Medical History:  Diagnosis Date   Anxiety    Colon cancer ([place]) 10/12/2014   Stage IIB; had chemo   Diabetes mellitus without complication (HCC)    Hyperlipidemia    Hypertension    Lateral epicondylitis    Personal history of chemotherapy    Polio    childhood   Skin cancer    Sqamous Cell, Lt Calf   Sleep apnea    no CPAP, sx improved with wt loss    Family History: Family History  Problem Relation Age of Onset   Alzheimer's disease Mother    Heart disease Father    Colon cancer Cousin    Colon cancer Cousin    Cancer Cousin        Brain Tumor   Breast cancer Neg Hx     Social History   Socioeconomic History   Marital status: Married    Spouse name: Not on file   Number of children: Not on file   Years of education: Not on file   Highest education level: Not on file  Occupational History   Not on file  Tobacco Use   Smoking status: Former    Packs/day: 1.50    Years: 20.00    Total pack years: 30.00    Types: Cigarettes    Quit date: 04/17/1981    Years since quitting: 40.9   Smokeless tobacco: Never  Vaping Use   Vaping Use: Never used  Substance and Sexual Activity   Alcohol use: No   Drug use: No   Sexual activity: Yes  Other Topics Concern   Not on file  Social History Narrative   Not on file   Social Determinants of Health    Financial Resource Strain: Low Risk  (06/22/2021)   Overall Financial Resource Strain (CARDIA)    Difficulty of Paying Living Expenses: Not hard at all  Food Insecurity: Not on file  Transportation Needs: No Transportation Needs (06/22/2021)   PRAPARE - Hydrologist (Medical): No    Lack of Transportation (Non-Medical): No  Physical Activity: Not on file  Stress: Not on file  Social Connections: Not on file  Intimate Partner Violence: Not on file      Review of Systems  Constitutional:  Negative for chills, fatigue and unexpected weight change.  HENT:  Positive for postnasal drip. Negative for congestion, rhinorrhea, sneezing and sore throat.   Eyes:  Negative for redness.  Respiratory:  Negative for cough, chest tightness and shortness of breath.   Cardiovascular:  Negative for chest pain and palpitations.  Gastrointestinal:  Negative for abdominal pain, constipation, diarrhea, nausea and vomiting.  Genitourinary:  Negative for dysuria and frequency.  Musculoskeletal:  Negative for arthralgias, back pain, joint swelling and neck pain.  Skin:  Negative for rash.  Neurological:  Positive for dizziness and light-headedness. Negative for tremors and numbness.  Hematological:  Negative for adenopathy. Does not bruise/bleed easily.  Psychiatric/Behavioral:  Negative for behavioral problems (Depression), sleep disturbance and suicidal ideas. The patient is not nervous/anxious.     Vital Signs: BP 132/70   Pulse 95   Temp 98.4 F (36.9 C)   Resp 16   Ht 5' 1.5" (1.562 m)   Wt 156 lb (70.8 kg)   SpO2 97%   BMI 29.00 kg/m    Physical Exam Vitals reviewed.  Constitutional:      General: She is not in acute distress.    Appearance: Normal appearance. She is normal weight. She is not ill-appearing.  HENT:     Head: Normocephalic and atraumatic.  Eyes:     Pupils: Pupils are equal, round, and reactive to light.  Cardiovascular:     Rate and Rhythm:  Normal rate and regular rhythm.  Pulmonary:     Effort: Pulmonary effort is normal. No respiratory distress.  Neurological:     Mental Status: She is alert and oriented to person, place, and time.     Cranial Nerves: No cranial nerve deficit.     Coordination: Coordination normal.     Gait: Gait normal.  Psychiatric:        Mood and Affect: Mood normal.        Behavior: Behavior normal.        Assessment/Plan: 1. Uncontrolled type 2 diabetes mellitus with hyperglycemia (HCC) No change in A1c or weight - POCT glycosylated hemoglobin (Hb A1C) - glipiZIDE (GLUCOTROL XL) 2.5 MG 24 hr tablet; Take 1 tablet (2.5 mg total) by mouth daily with breakfast.  Dispense: 30 tablet; Refill: 2  2. Essential hypertension Stable, continue medications as prescribed.   3. Abnormal weight gain No weight gain or loss since last visit.    General Counseling: pansie guggisberg understanding of the findings of todays visit and agrees with plan of treatment. I have discussed any further diagnostic evaluation that may be needed or ordered today. We also reviewed her medications today. she has been encouraged to call the office with any questions or concerns that should arise related to todays visit.    Orders Placed This Encounter  Procedures   POCT glycosylated hemoglobin (Hb A1C)    Meds ordered this encounter  Medications   glipiZIDE (GLUCOTROL XL) 2.5 MG 24 hr tablet    Sig: Take 1 tablet (2.5 mg total) by mouth daily with breakfast.    Dispense:  30 tablet    Refill:  2    Fill today.    Return for previously scheduled, CPE, Caylea Foronda PCP in november.   Total time spent:30 Minutes Time spent includes review of chart, medications, test results, and follow up plan with the patient.   [place] Controlled Substance Database was reviewed by me.  This patient was seen by Jonetta Osgood, FNP-C in collaboration with Dr. Clayborn Bigness as a part of collaborative care agreement.   Dougles Kimmey R.  Anastasiya Gowin,  MSN, FNP-C Internal medicine

## 2022-02-13 ENCOUNTER — Encounter (INDEPENDENT_AMBULATORY_CARE_PROVIDER_SITE_OTHER): Payer: Self-pay

## 2022-02-24 ENCOUNTER — Other Ambulatory Visit: Payer: Self-pay | Admitting: Nurse Practitioner

## 2022-02-24 ENCOUNTER — Ambulatory Visit: Payer: Medicare HMO | Admitting: Nurse Practitioner

## 2022-02-24 DIAGNOSIS — I700 Atherosclerosis of aorta: Secondary | ICD-10-CM

## 2022-03-03 ENCOUNTER — Other Ambulatory Visit: Payer: Self-pay | Admitting: Internal Medicine

## 2022-03-08 ENCOUNTER — Ambulatory Visit (INDEPENDENT_AMBULATORY_CARE_PROVIDER_SITE_OTHER): Payer: Medicare HMO | Admitting: Nurse Practitioner

## 2022-03-08 ENCOUNTER — Encounter: Payer: Self-pay | Admitting: Nurse Practitioner

## 2022-03-08 VITALS — BP 132/73 | HR 93 | Temp 96.9°F | Resp 16 | Ht 61.5 in | Wt 157.6 lb

## 2022-03-08 DIAGNOSIS — Z0001 Encounter for general adult medical examination with abnormal findings: Secondary | ICD-10-CM | POA: Diagnosis not present

## 2022-03-08 DIAGNOSIS — Z1231 Encounter for screening mammogram for malignant neoplasm of breast: Secondary | ICD-10-CM | POA: Diagnosis not present

## 2022-03-08 DIAGNOSIS — J300 Vasomotor rhinitis: Secondary | ICD-10-CM

## 2022-03-08 DIAGNOSIS — E1165 Type 2 diabetes mellitus with hyperglycemia: Secondary | ICD-10-CM | POA: Diagnosis not present

## 2022-03-08 DIAGNOSIS — E782 Mixed hyperlipidemia: Secondary | ICD-10-CM

## 2022-03-08 MED ORDER — FLUTICASONE PROPIONATE 50 MCG/ACT NA SUSP: 2.0000 | Freq: Every day | NASAL | 1 refills | Status: AC

## 2022-03-08 MED ORDER — TIRZEPATIDE 5 MG/0.5ML ~~LOC~~ SOAJ: 5.0000 mg | SUBCUTANEOUS | 2 refills | Status: DC

## 2022-03-08 MED ORDER — DEXCOM G7 RECEIVER DEVI: 1.0000 | Freq: Once | 0 refills | Status: AC

## 2022-03-08 MED ORDER — DEXCOM G7 SENSOR MISC: 3 refills | Status: DC

## 2022-03-08 NOTE — Progress Notes (Signed)
[facility] [place],  [id number]  Internal MEDICINE  Office Visit Note  Patient Name: Tiffany Hawkins  [id number]  [id number]  Date of Service: 03/08/2022  Chief Complaint  Patient presents with   Medicare Wellness   Diabetes   Hypertension   Hyperlipidemia    HPI Hatley presents for an annual well visit and physical exam.  Well-appearing 76 y.o. female with hypertension, aortic atherosclerosis, allergic rhinitis, GERD, high cholesterol and type 2 diabetes.  Routine CRC screening: done in 2019, due in 2024 Routine mammogram: overdue for routine mammogram  DEXA scan: done in 2016 Eye exam: done in June this year foot exam: done today Labs: due for routine labs New or worsening pain: none Other concerns: none       03/08/2022   10:58 AM 03/04/2021   10:24 AM 03/01/2020   11:51 AM  MMSE - Mini Mental State Exam  Orientation to time _0 Orientation to Place _1 Registration _2 Attention/ Calculation _3 Recall _4 Language- name 2 objects _5 Language- repeat _6 Language- follow 3 step command _7 Language- read & follow direction _8 Write a sentence _9 Copy design _10 Total score _11 Functional Status Survey: Is the patient deaf or have difficulty hearing?: Yes Does the patient have difficulty seeing, even when wearing glasses/contacts?: No Does the patient have difficulty concentrating, remembering, or making decisions?: Yes Does the patient have difficulty walking or climbing stairs?: Yes Does the patient have difficulty dressing or bathing?: No Does the patient have difficulty doing errands alone such as visiting a doctor's office or shopping?: No     05/04/2021    1:28 PM 08/03/2021    1:21 PM 11/09/2021    1:25 PM 02/08/2022    1:25 PM 03/08/2022   10:56 AM  Fall Risk  Falls in the past year? _12 0 0  Was there an injury with Fall? 1 0 0 0 0  Fall Risk Category Calculator  _13 0 0  Fall Risk Category Moderate Moderate Moderate Low Low  Patient Fall Risk Level  Moderate fall risk Moderate fall risk Low fall risk Low fall risk  Patient at Risk for Falls Due to    No Fall Risks No Fall Risks  Fall risk Follow up  Falls evaluation completed Falls evaluation completed Falls evaluation completed Falls evaluation completed       03/08/2022   10:56 AM  Depression screen PHQ 2/9  Decreased Interest 0  Down, Depressed, Hopeless 0  PHQ - 2 Score 0        Current Medication: Outpatient Encounter Medications as of 03/08/2022  Medication Sig   albuterol (VENTOLIN HFA) 108 (90 Base) MCG/ACT inhaler Inhale 2 puffs into the lungs every 6 (six) hours as needed for wheezing or shortness of breath.   amLODipine (NORVASC) 5 MG tablet Take 2 tablets (10 mg total) by mouth daily.   Blood Glucose Monitoring Suppl (TRUE METRIX METER) w/Device KIT USE AS DIRECTED   cetirizine (ZYRTEC) 10 MG chewable tablet Chew 1 tablet (10 mg total) by mouth daily.   citalopram (CELEXA) 40 MG tablet Take 1 tablet (40 mg total) by mouth daily.   Dapagliflozin Propanediol (FARXIGA PO) Take 10 mg by mouth daily.   fenofibrate 54  MG tablet TAKE 1 TABLET EVERY DAY   glipiZIDE (GLUCOTROL XL) 2.5 MG 24 hr tablet Take 1 tablet (2.5 mg total) by mouth daily with breakfast.   ibuprofen (ADVIL) 800 MG tablet Take 1 tablet (800 mg total) by mouth every 8 (eight) hours as needed.   insulin aspart protamine - aspart (NOVOLOG MIX 70/30 FLEXPEN) (70-30) 100 UNIT/ML FlexPen Inject 35 units [place] QAM and inject 40units Rand QPM   lovastatin (MEVACOR) 20 MG tablet TAKE 1 TABLET AT BEDTIME FOR HIGH CHOLESTEROL   Multiple Vitamin (MULTIVITAMIN) tablet Take by mouth daily. TAKES 1/2 TABLET   pantoprazole (PROTONIX) 20 MG tablet Take 2 tablets (40 mg total) by mouth daily.   sharps container 1 each by Does not apply route as needed. To use for insulin syringes and needles. Taking insulin at least twice daily.   E11.65   spironolactone (ALDACTONE) 100 MG tablet Take 100 mg by mouth daily. 2 tablets once a day   tirzepatide (MOUNJARO) 5 MG/0.5ML Pen Inject 5 mg into the skin once a week.   TRUE METRIX BLOOD GLUCOSE TEST test strip TEST BLOOD SUGAR THREE TIMES DAILY AFTER MEALS   TRUEplus Lancets 33G MISC USE AS DIRECTED TO TEST BLOOD SUGAR THREE TIMES DAILY   [DISCONTINUED] Continuous Blood Gluc Receiver (DEXCOM G7 RECEIVER) DEVI Use as directed one 1 year DXE11.65   [DISCONTINUED] Continuous Blood Gluc Sensor (DEXCOM G7 SENSOR) MISC Use as directed every 10 days Dx E11.65   [DISCONTINUED] fluticasone (FLONASE) 50 MCG/ACT nasal spray Place 2 sprays into both nostrils daily.   [DISCONTINUED] lovastatin (MEVACOR) 20 MG tablet TAKE 1 TABLET AT BEDTIME FOR HIGH CHOLESTEROL   [EXPIRED] Continuous Blood Gluc Receiver (DEXCOM G7 RECEIVER) DEVI 1 Device by Does not apply route once for 1 dose.   Continuous Blood Gluc Sensor (DEXCOM G7 SENSOR) MISC Apply 1 sensor every 10 days to check glucose levels   fluticasone (FLONASE) 50 MCG/ACT nasal spray Place 2 sprays into both nostrils daily.   No facility-administered encounter medications on file as of 03/08/2022.    Surgical History: Past Surgical History:  Procedure Laterality Date   APPENDECTOMY     BACK SURGERY     CATARACT EXTRACTION W/PHACO Left 12/03/2017   Procedure: CATARACT EXTRACTION PHACO AND INTRAOCULAR LENS PLACEMENT ([place]) LEFT  DIABETIC;  Surgeon: Eulogio Bear, MD;  Location: [facility];  Service: Ophthalmology;  Laterality: Left;  Diabetic - insulin   CATARACT EXTRACTION W/PHACO Right 01/14/2018   Procedure: CATARACT EXTRACTION PHACO AND INTRAOCULAR LENS PLACEMENT ([facility]) RIGHT DIABETIC;  Surgeon: Eulogio Bear, MD;  Location: [facility];  Service: Ophthalmology;  Laterality: Right;  Diabetic - insulin   CERVICAL DISC SURGERY     CHOLECYSTECTOMY     COLON SURGERY     COLONOSCOPY     COLONOSCOPY WITH PROPOFOL N/A  05/17/2015   Procedure: COLONOSCOPY WITH PROPOFOL;  Surgeon: Manya Silvas, MD;  Location: [facility];  Service: Endoscopy;  Laterality: N/A;   COLONOSCOPY WITH PROPOFOL N/A 04/01/2018   Procedure: COLONOSCOPY WITH BIOPSY;  Surgeon: [name], MD;  Location: [facility];  Service: Endoscopy;  Laterality: N/A;  Diabetic - insulin   EMBOLIZATION N/A 01/24/2021   Procedure: EMBOLIZATION;  Surgeon: Algernon Huxley, MD;  Location: [facility];  Service: Cardiovascular;  Laterality: N/A;   ESOPHAGOGASTRODUODENOSCOPY     HERNIA REPAIR     LOWER EXTREMITY VENOGRAPHY Left 01/24/2021   Procedure: LOWER EXTREMITY VENOGRAPHY;  Surgeon: Algernon Huxley, MD;  Location: [facility];  Service: Cardiovascular;  Laterality: Left;   POLYPECTOMY N/A 04/01/2018   Procedure: POLYPECTOMY;  Surgeon: [name], MD;  Location: [facility];  Service: Endoscopy;  Laterality: N/A;   PORT A CATH INJECTION ([place] [facility])     Port has been removed   TUBAL LIGATION      Medical History: Past Medical History:  Diagnosis Date   Anxiety    Colon cancer ([place]) 10/12/2014   Stage IIB; had chemo   Diabetes mellitus without complication (HCC)    Hyperlipidemia    Hypertension    Lateral epicondylitis    Personal history of chemotherapy    Polio    childhood   Skin cancer    Sqamous Cell, Lt Calf   Sleep apnea    no CPAP, sx improved with wt loss    Family History: Family History  Problem Relation Age of Onset   Alzheimer's disease Mother    Heart disease Father    Colon cancer Cousin    Colon cancer Cousin    Cancer Cousin        Brain Tumor   Breast cancer Neg Hx     Social History   Socioeconomic History   Marital status: Married    Spouse name: Not on file   Number of children: Not on file   Years of education: Not on file   Highest education level: Not on file  Occupational History   Not on file  Tobacco Use   Smoking status: Former    Packs/day: 1.50     Years: 20.00    Total pack years: 30.00    Types: Cigarettes    Quit date: 04/17/1981    Years since quitting: 41.0   Smokeless tobacco: Never  Vaping Use   Vaping Use: Never used  Substance and Sexual Activity   Alcohol use: No   Drug use: No   Sexual activity: Yes  Other Topics Concern   Not on file  Social History Narrative   Not on file   Social Determinants of Health   Financial Resource Strain: Low Risk  (06/22/2021)   Overall Financial Resource Strain (CARDIA)    Difficulty of Paying Living Expenses: Not hard at all  Food Insecurity: Not on file  Transportation Needs: No Transportation Needs (06/22/2021)   PRAPARE - Hydrologist (Medical): No    Lack of Transportation (Non-Medical): No  Physical Activity: Not on file  Stress: Not on file  Social Connections: Not on file  Intimate Partner Violence: Not on file      Review of Systems  Constitutional:  Negative for activity change, appetite change, chills, fatigue, fever and unexpected weight change.  HENT: Negative.  Negative for congestion, ear pain, rhinorrhea, sore throat and trouble swallowing.   Eyes: Negative.   Respiratory: Negative.  Negative for cough, chest tightness, shortness of breath and wheezing.   Cardiovascular: Negative.  Negative for chest pain.  Gastrointestinal: Negative.  Negative for abdominal pain, blood in stool, constipation, diarrhea, nausea and vomiting.  Endocrine: Negative.   Genitourinary: Negative.  Negative for difficulty urinating, dysuria, frequency, hematuria and urgency.  Musculoskeletal: Negative.  Negative for arthralgias, back pain, joint swelling, myalgias and neck pain.  Skin: Negative.  Negative for rash and wound.  Allergic/Immunologic: Negative.  Negative for immunocompromised state.  Neurological: Negative.  Negative for dizziness, seizures, numbness and headaches.  Hematological: Negative.   Psychiatric/Behavioral: Negative.  Negative for  behavioral problems,  self-injury and suicidal ideas. The patient is not nervous/anxious.     Vital Signs: BP 132/73   Pulse 93   Temp (!) 96.9 F (36.1 C)   Resp 16   Ht 5' 1.5" (1.562 m)   Wt 157 lb 9.6 oz (71.5 kg)   SpO2 96%   BMI 29.30 kg/m    Physical Exam Vitals reviewed.  Constitutional:      General: She is awake. She is not in acute distress.    Appearance: Normal appearance. She is well-developed, well-groomed and normal weight. She is not ill-appearing or diaphoretic.  HENT:     Head: Normocephalic and atraumatic.     Right Ear: Tympanic membrane, ear canal and external ear normal.     Left Ear: Tympanic membrane, ear canal and external ear normal.     Nose: Nose normal. No congestion or rhinorrhea.     Mouth/Throat:     Lips: Pink.     Mouth: Mucous membranes are moist.     Pharynx: Oropharynx is clear. Uvula midline. No oropharyngeal exudate or posterior oropharyngeal erythema.  Eyes:     General: Lids are normal. Vision grossly intact. Gaze aligned appropriately. No scleral icterus.       Right eye: No discharge.        Left eye: No discharge.     Extraocular Movements: Extraocular movements intact.     Conjunctiva/sclera: Conjunctivae normal.     Pupils: Pupils are equal, round, and reactive to light.     Funduscopic exam:    Right eye: Red reflex present.        Left eye: Red reflex present. Neck:     Thyroid: No thyromegaly.     Vascular: No carotid bruit or JVD.     Trachea: Trachea and phonation normal. No tracheal deviation.  Cardiovascular:     Rate and Rhythm: Normal rate and regular rhythm.     Pulses: Normal pulses.          Dorsalis pedis pulses are 2+ on the right side and 2+ on the left side.       Posterior tibial pulses are 2+ on the right side and 2+ on the left side.     Heart sounds: Normal heart sounds, S1 normal and S2 normal. No murmur heard.    No friction rub. No gallop.  Pulmonary:     Effort: Pulmonary effort is normal. No  accessory muscle usage or respiratory distress.     Breath sounds: Normal breath sounds and air entry. No stridor. No wheezing or rales.  Chest:     Chest wall: No tenderness.     Comments: Declined clinical breast exam Abdominal:     General: Bowel sounds are normal. There is no distension.     Palpations: Abdomen is soft. There is no shifting dullness, fluid wave, mass or pulsatile mass.     Tenderness: There is no abdominal tenderness. There is no guarding or rebound.  Musculoskeletal:        General: No tenderness or deformity. Normal range of motion.     Cervical back: Normal range of motion and neck supple.     Right foot: Prominent metatarsal heads present.     Left foot: Prominent metatarsal heads present.  Feet:     Right foot:     Protective Sensation: 6 sites tested.  5 sites sensed.     Skin integrity: Callus present.     Toenail Condition: Right toenails are abnormally thick, long  and ingrown.     Left foot:     Protective Sensation: 6 sites tested.  5 sites sensed.     Skin integrity: Callus present.     Toenail Condition: Left toenails are abnormally thick, long and ingrown.  Lymphadenopathy:     Cervical: No cervical adenopathy.  Skin:    General: Skin is warm and dry.     Capillary Refill: Capillary refill takes less than 2 seconds.     Coloration: Skin is not pale.     Findings: No erythema or rash.  Neurological:     Mental Status: She is alert and oriented to person, place, and time.     Cranial Nerves: No cranial nerve deficit.     Motor: No abnormal muscle tone.     Coordination: Coordination normal.     Gait: Gait normal.     Deep Tendon Reflexes: Reflexes are normal and symmetric.  Psychiatric:        Mood and Affect: Mood and affect normal.        Behavior: Behavior normal. Behavior is cooperative.        Thought Content: Thought content normal.        Judgment: Judgment normal.        Assessment/Plan: 1. Encounter for general adult medical  examination with abnormal findings Age-appropriate preventive screenings and vaccinations discussed, annual physical exam completed. Routine labs for health maintenance ordered, see below. PHM updated.  - fluticasone (FLONASE) 50 MCG/ACT nasal spray; Place 2 sprays into both nostrils daily.  Dispense: 48 g; Refill: 1 - CBC with Differential/Platelet - CMP14+EGFR - Lipid Profile  2. Mixed hyperlipidemia Routine labs ordered - CBC with Differential/Platelet - CMP14+EGFR - Lipid Profile  3. Uncontrolled type 2 diabetes mellitus with hyperglycemia ([place]) Refills ordered, will try mounjaro, routine labs ordered - Continuous Blood Gluc Sensor (DEXCOM G7 SENSOR) MISC; Apply 1 sensor every 10 days to check glucose levels  Dispense: 3 each; Refill: 3 - Continuous Blood Gluc Receiver ([place]) DEVI; 1 Device by Does not apply route once for 1 dose.  Dispense: 1 each; Refill: 0 - tirzepatide (MOUNJARO) 5 MG/0.5ML Pen; Inject 5 mg into the skin once a week.  Dispense: 2 mL; Refill: 2 - CBC with Differential/Platelet - CMP14+EGFR - Lipid Profile  4. Encounter for screening mammogram for malignant neoplasm of breast Routine mammogram ordered - MM 3D SCREEN BREAST BILATERAL; Future     General Counseling: texie tupou understanding of the findings of todays visit and agrees with plan of treatment. I have discussed any further diagnostic evaluation that may be needed or ordered today. We also reviewed her medications today. she has been encouraged to call the office with any questions or concerns that should arise related to todays visit.    Orders Placed This Encounter  Procedures   MM 3D SCREEN BREAST BILATERAL   CBC with Differential/Platelet   CMP14+EGFR   Lipid Profile    Meds ordered this encounter  Medications   Continuous Blood Gluc Sensor (DEXCOM G7 SENSOR) MISC    Sig: Apply 1 sensor every 10 days to check glucose levels    Dispense:  3 each    Refill:  3    Dx  code E11.65, she is on insulin   Continuous Blood Gluc Receiver (DEXCOM G7 RECEIVER) DEVI    Sig: 1 Device by Does not apply route once for 1 dose.    Dispense:  1 each    Refill:  0  Dx code E11.65, patient is on insulin   tirzepatide (MOUNJARO) 5 MG/0.5ML Pen    Sig: Inject 5 mg into the skin once a week.    Dispense:  2 mL    Refill:  2    Dx code E 11.65   fluticasone (FLONASE) 50 MCG/ACT nasal spray    Sig: Place 2 sprays into both nostrils daily.    Dispense:  48 g    Refill:  1    Return in about 2 months (around 05/16/2022) for F/U, Recheck A1C, Boruch Manuele PCP.   Total time spent:30 Minutes Time spent includes review of chart, medications, test results, and follow up plan with the patient.   [place] Controlled Substance Database was reviewed by me.  This patient was seen by Jonetta Osgood, FNP-C in collaboration with Dr. Clayborn Bigness as a part of collaborative care agreement.  Lorelle Macaluso R. Valetta Fuller, MSN, FNP-C Internal medicine

## 2022-03-19 ENCOUNTER — Encounter: Payer: Self-pay | Admitting: Nurse Practitioner

## 2022-04-04 ENCOUNTER — Telehealth: Payer: Self-pay | Admitting: Nurse Practitioner

## 2022-04-04 NOTE — Telephone Encounter (Signed)
Pt  advised pres already send last month

## 2022-04-18 ENCOUNTER — Encounter: Payer: Self-pay | Admitting: Nurse Practitioner

## 2022-04-18 NOTE — Addendum Note (Signed)
Addended by: Jonetta Osgood on: 04/18/2022 08:17 AM   Modules accepted: Level of Service

## 2022-04-19 ENCOUNTER — Ambulatory Visit: Payer: Medicare HMO

## 2022-04-24 ENCOUNTER — Telehealth: Payer: Self-pay

## 2022-05-03 ENCOUNTER — Other Ambulatory Visit: Payer: Self-pay

## 2022-05-03 DIAGNOSIS — E1165 Type 2 diabetes mellitus with hyperglycemia: Secondary | ICD-10-CM

## 2022-05-03 MED ORDER — DAPAGLIFLOZIN PROPANEDIOL 10 MG PO TABS: 10.0000 mg | ORAL_TABLET | Freq: Every day | ORAL | 1 refills | Status: DC

## 2022-05-03 NOTE — Telephone Encounter (Signed)
Pt advised 2 more samples ready for pickup and also he can add other med after we recheck her A1c

## 2022-05-12 DIAGNOSIS — E1165 Type 2 diabetes mellitus with hyperglycemia: Secondary | ICD-10-CM | POA: Diagnosis not present

## 2022-05-12 DIAGNOSIS — Z0001 Encounter for general adult medical examination with abnormal findings: Secondary | ICD-10-CM | POA: Diagnosis not present

## 2022-05-12 DIAGNOSIS — E782 Mixed hyperlipidemia: Secondary | ICD-10-CM | POA: Diagnosis not present

## 2022-05-13 LAB — CMP14+EGFR
ALT: 22 IU/L (ref 0–32)
AST: 15 IU/L (ref 0–40)
Albumin/Globulin Ratio: 2.2 (ref 1.2–2.2)
Albumin: 4.8 g/dL (ref 3.8–4.8)
Alkaline Phosphatase: 38 IU/L — ABNORMAL LOW (ref 44–121)
BUN/Creatinine Ratio: 24 (ref 12–28)
BUN: 30 mg/dL — ABNORMAL HIGH (ref 8–27)
Bilirubin Total: 0.3 mg/dL (ref 0.0–1.2)
CO2: 21 mmol/L (ref 20–29)
Calcium: 10 mg/dL (ref 8.7–10.3)
Chloride: 105 mmol/L (ref 96–106)
Creatinine, Ser: 1.26 mg/dL — ABNORMAL HIGH (ref 0.57–1.00)
Globulin, Total: 2.2 g/dL (ref 1.5–4.5)
Glucose: 100 mg/dL — ABNORMAL HIGH (ref 70–99)
Potassium: 5.2 mmol/L (ref 3.5–5.2)
Sodium: 142 mmol/L (ref 134–144)
Total Protein: 7 g/dL (ref 6.0–8.5)
eGFR: 44 mL/min/{1.73_m2} — ABNORMAL LOW (ref >59)

## 2022-05-13 LAB — LIPID PANEL
Chol/HDL Ratio: 4.3 ratio (ref 0.0–4.4)
Cholesterol, Total: 164 mg/dL (ref 100–199)
HDL: 38 mg/dL — ABNORMAL LOW (ref >39)
LDL Chol Calc (NIH): 98 mg/dL (ref 0–99)
Triglycerides: 160 mg/dL — ABNORMAL HIGH (ref 0–149)
VLDL Cholesterol Cal: 28 mg/dL (ref 5–40)

## 2022-05-13 LAB — CBC WITH DIFFERENTIAL/PLATELET
Basophils Absolute: 0 10*3/uL (ref 0.0–0.2)
Basos: 0 %
EOS (ABSOLUTE): 0.2 10*3/uL (ref 0.0–0.4)
Eos: 3 %
Hematocrit: 45.6 % (ref 34.0–46.6)
Hemoglobin: 14.5 g/dL (ref 11.1–15.9)
Immature Grans (Abs): 0.1 10*3/uL (ref 0.0–0.1)
Immature Granulocytes: 1 %
Lymphocytes Absolute: 1.7 10*3/uL (ref 0.7–3.1)
Lymphs: 21 %
MCH: 28.9 pg (ref 26.6–33.0)
MCHC: 31.8 g/dL (ref 31.5–35.7)
MCV: 91 fL (ref 79–97)
Monocytes Absolute: 0.8 10*3/uL (ref 0.1–0.9)
Monocytes: 10 %
Neutrophils Absolute: 5.3 10*3/uL (ref 1.4–7.0)
Neutrophils: 65 %
Platelets: 216 10*3/uL (ref 150–450)
RBC: 5.02 x10E6/uL (ref 3.77–5.28)
RDW: 13.8 % (ref 11.7–15.4)
WBC: 8.1 10*3/uL (ref 3.4–10.8)

## 2022-05-17 ENCOUNTER — Ambulatory Visit (INDEPENDENT_AMBULATORY_CARE_PROVIDER_SITE_OTHER): Payer: Medicare HMO | Admitting: Nurse Practitioner

## 2022-05-17 ENCOUNTER — Encounter: Payer: Self-pay | Admitting: Nurse Practitioner

## 2022-05-17 VITALS — BP 132/67 | HR 86 | Temp 98.3°F | Resp 16 | Ht 61.5 in | Wt 149.8 lb

## 2022-05-17 DIAGNOSIS — R635 Abnormal weight gain: Secondary | ICD-10-CM | POA: Diagnosis not present

## 2022-05-17 DIAGNOSIS — E782 Mixed hyperlipidemia: Secondary | ICD-10-CM | POA: Diagnosis not present

## 2022-05-17 DIAGNOSIS — I10 Essential (primary) hypertension: Secondary | ICD-10-CM | POA: Diagnosis not present

## 2022-05-17 DIAGNOSIS — E1165 Type 2 diabetes mellitus with hyperglycemia: Secondary | ICD-10-CM | POA: Diagnosis not present

## 2022-05-17 LAB — POCT GLYCOSYLATED HEMOGLOBIN (HGB A1C): Hemoglobin A1C: 6.1 % — AB (ref 4.0–5.6)

## 2022-05-17 MED ORDER — DAPAGLIFLOZIN PROPANEDIOL 10 MG PO TABS: 10.0000 mg | ORAL_TABLET | Freq: Every day | ORAL | 1 refills | Status: DC

## 2022-05-17 MED ORDER — TIRZEPATIDE 2.5 MG/0.5ML ~~LOC~~ SOAJ: 2.5000 mg | SUBCUTANEOUS | 3 refills | Status: DC

## 2022-05-17 NOTE — Progress Notes (Signed)
[facility] [place], [place] [address]  Internal MEDICINE  Office Visit Note  Patient Name: Tiffany Hawkins  [id number]  [id number]  Date of Service: 05/17/2022  Chief Complaint  Patient presents with   Follow-up   Hyperlipidemia   Hypertension   Diabetes    HPI Tiffany Hawkins presents for a follow-up visit for Diabetes -- A1c improved to 6.1 mounjaro helped but had significant diarrhea and vomiting at the higher dose of '5mg'$  . Tolerated 2.5 mg dose.  Novolog assistance was denied, need to check paperwork. Urine odor different Left upper arm hurts and difficult to lift arm above head. -- does not want to do anything about it now.     Current Medication: Outpatient Encounter Medications as of 05/17/2022  Medication Sig   albuterol (VENTOLIN HFA) 108 (90 Base) MCG/ACT inhaler Inhale 2 puffs into the lungs every 6 (six) hours as needed for wheezing or shortness of breath.   amLODipine (NORVASC) 5 MG tablet Take 2 tablets (10 mg total) by mouth daily.   Blood Glucose Monitoring Suppl (TRUE METRIX METER) w/Device KIT USE AS DIRECTED   cetirizine (ZYRTEC) 10 MG chewable tablet Chew 1 tablet (10 mg total) by mouth daily.   citalopram (CELEXA) 40 MG tablet Take 1 tablet (40 mg total) by mouth daily.   Continuous Blood Gluc Sensor (DEXCOM G7 SENSOR) MISC Apply 1 sensor every 10 days to check glucose levels   fenofibrate 54 MG tablet TAKE 1 TABLET EVERY DAY   fluticasone (FLONASE) 50 MCG/ACT nasal spray Place 2 sprays into both nostrils daily.   glipiZIDE (GLUCOTROL XL) 2.5 MG 24 hr tablet Take 1 tablet (2.5 mg total) by mouth daily with breakfast.   ibuprofen (ADVIL) 800 MG tablet Take 1 tablet (800 mg total) by mouth every 8 (eight) hours as needed.   insulin aspart protamine - aspart (NOVOLOG MIX 70/30 FLEXPEN) (70-30) 100 UNIT/ML FlexPen Inject 35 units [place] QAM and inject 40units [place] QPM   lovastatin (MEVACOR) 20 MG tablet TAKE 1 TABLET AT BEDTIME FOR HIGH CHOLESTEROL    Multiple Vitamin (MULTIVITAMIN) tablet Take by mouth daily. TAKES 1/2 TABLET   pantoprazole (PROTONIX) 20 MG tablet Take 2 tablets (40 mg total) by mouth daily.   sharps container 1 each by Does not apply route as needed. To use for insulin syringes and needles. Taking insulin at least twice daily.  E11.65   spironolactone (ALDACTONE) 100 MG tablet Take 100 mg by mouth daily. 2 tablets once a day   tirzepatide [facility]) 2.5 MG/0.5ML Pen Inject 2.5 mg into the skin once a week.   TRUE METRIX BLOOD GLUCOSE TEST test strip TEST BLOOD SUGAR THREE TIMES DAILY AFTER MEALS   TRUEplus Lancets 33G MISC USE AS DIRECTED TO TEST BLOOD SUGAR THREE TIMES DAILY   [DISCONTINUED] dapagliflozin propanediol (FARXIGA) 10 MG TABS tablet Take 1 tablet (10 mg total) by mouth daily.   [DISCONTINUED] tirzepatide [facility]) 5 MG/0.5ML Pen Inject 5 mg into the skin once a week.   dapagliflozin propanediol (FARXIGA) 10 MG TABS tablet Take 1 tablet (10 mg total) by mouth daily.   No facility-administered encounter medications on file as of 05/17/2022.    Surgical History: Past Surgical History:  Procedure Laterality Date   APPENDECTOMY     BACK SURGERY     CATARACT EXTRACTION W/PHACO Left 12/03/2017   Procedure: CATARACT EXTRACTION PHACO AND INTRAOCULAR LENS PLACEMENT (IOC) LEFT  DIABETIC;  Surgeon: Eulogio Bear, MD;  Location: [place];  Service: Ophthalmology;  Laterality: Left;  Diabetic - insulin   CATARACT EXTRACTION W/PHACO Right 01/14/2018   Procedure: CATARACT EXTRACTION PHACO AND INTRAOCULAR LENS PLACEMENT (IOC) RIGHT DIABETIC;  Surgeon: Eulogio Bear, MD;  Location: [place];  Service: Ophthalmology;  Laterality: Right;  Diabetic - insulin   CERVICAL DISC SURGERY     CHOLECYSTECTOMY     COLON SURGERY     COLONOSCOPY     COLONOSCOPY WITH PROPOFOL N/A 05/17/2015   Procedure: COLONOSCOPY WITH PROPOFOL;  Surgeon: Manya Silvas, MD;  Location: [facility] ENDOSCOPY;  Service:  Endoscopy;  Laterality: N/A;   COLONOSCOPY WITH PROPOFOL N/A 04/01/2018   Procedure: COLONOSCOPY WITH BIOPSY;  Surgeon: [name], MD;  Location: [place];  Service: Endoscopy;  Laterality: N/A;  Diabetic - insulin   EMBOLIZATION N/A 01/24/2021   Procedure: EMBOLIZATION;  Surgeon: Algernon Huxley, MD;  Location: [place] CV LAB;  Service: Cardiovascular;  Laterality: N/A;   ESOPHAGOGASTRODUODENOSCOPY     HERNIA REPAIR     LOWER EXTREMITY VENOGRAPHY Left 01/24/2021   Procedure: LOWER EXTREMITY VENOGRAPHY;  Surgeon: Algernon Huxley, MD;  Location: [place] CV LAB;  Service: Cardiovascular;  Laterality: Left;   POLYPECTOMY N/A 04/01/2018   Procedure: POLYPECTOMY;  Surgeon: [name], MD;  Location: [place];  Service: Endoscopy;  Laterality: N/A;   PORT A CATH INJECTION ([place] HX)     Port has been removed   TUBAL LIGATION      Medical History: Past Medical History:  Diagnosis Date   Anxiety    Colon cancer ([place]) 10/12/2014   Stage IIB; had chemo   Diabetes mellitus without complication (HCC)    Hyperlipidemia    Hypertension    Lateral epicondylitis    Personal history of chemotherapy    Polio    childhood   Skin cancer    Sqamous Cell, Lt Calf   Sleep apnea    no CPAP, sx improved with wt loss    Family History: Family History  Problem Relation Age of Onset   Alzheimer's disease Mother    Heart disease Father    Colon cancer Cousin    Colon cancer Cousin    Cancer Cousin        Brain Tumor   Breast cancer Neg Hx     Social History   Socioeconomic History   Marital status: Married    Spouse name: Not on file   Number of children: Not on file   Years of education: Not on file   Highest education level: Not on file  Occupational History   Not on file  Tobacco Use   Smoking status: Former    Packs/day: 1.50    Years: 20.00    Total pack years: 30.00    Types: Cigarettes    Quit date: 04/17/1981    Years since quitting: 41.1    Smokeless tobacco: Never  Vaping Use   Vaping Use: Never used  Substance and Sexual Activity   Alcohol use: No   Drug use: No   Sexual activity: Yes  Other Topics Concern   Not on file  Social History Narrative   Not on file   Social Determinants of Health   Financial Resource Strain: Low Risk  (06/22/2021)   Overall Financial Resource Strain (CARDIA)    Difficulty of Paying Living Expenses: Not hard at all  Food Insecurity: Not on file  Transportation Needs: No Transportation Needs (06/22/2021)   [place] - Transportation  Lack of Transportation (Medical): No    Lack of Transportation (Non-Medical): No  Physical Activity: Not on file  Stress: Not on file  Social Connections: Not on file  Intimate Partner Violence: Not on file      Review of Systems  Constitutional:  Negative for chills, fatigue and unexpected weight change.  HENT:  Positive for postnasal drip. Negative for congestion, rhinorrhea, sneezing and sore throat.   Eyes:  Negative for redness.  Respiratory:  Negative for cough, chest tightness and shortness of breath.   Cardiovascular:  Negative for chest pain and palpitations.  Gastrointestinal:  Positive for nausea and vomiting. Negative for abdominal pain, constipation and diarrhea.  Genitourinary:  Negative for dysuria and frequency.  Musculoskeletal:  Negative for arthralgias, back pain, joint swelling and neck pain.  Skin:  Negative for rash.  Neurological:  Negative for tremors and numbness.  Hematological:  Negative for adenopathy. Does not bruise/bleed easily.  Psychiatric/Behavioral:  Negative for behavioral problems (Depression), sleep disturbance and suicidal ideas. The patient is not nervous/anxious.     Vital Signs: BP 132/67   Pulse 86   Temp 98.3 F (36.8 C)   Resp 16   Ht 5' 1.5" (1.562 m)   Wt 149 lb 12.8 oz (67.9 kg)   SpO2 95%   BMI 27.85 kg/m    Physical Exam Vitals reviewed.  Constitutional:      General: She is not in acute  distress.    Appearance: Normal appearance. She is normal weight. She is not ill-appearing.  HENT:     Head: Normocephalic and atraumatic.  Eyes:     Pupils: Pupils are equal, round, and reactive to light.  Cardiovascular:     Rate and Rhythm: Normal rate and regular rhythm.  Pulmonary:     Effort: Pulmonary effort is normal. No respiratory distress.  Neurological:     Mental Status: She is alert and oriented to person, place, and time.     Cranial Nerves: No cranial nerve deficit.     Coordination: Coordination normal.     Gait: Gait normal.  Psychiatric:        Mood and Affect: Mood normal.        Behavior: Behavior normal.        Assessment/Plan: 1. Uncontrolled type 2 diabetes mellitus with hyperglycemia (HCC) A1c improved down to 6.1. continue farxiga, samples provided. Continue lower dose of mounjaro. Resent financial assistance applications for farxiga and lantus - POCT glycosylated hemoglobin (Hb A1C) - dapagliflozin propanediol (FARXIGA) 10 MG TABS tablet; Take 1 tablet (10 mg total) by mouth daily.  Dispense: 30 tablet; Refill: 1 - tirzepatide (MOUNJARO) 2.5 MG/0.5ML Pen; Inject 2.5 mg into the skin once a week.  Dispense: 2 mL; Refill: 3 - Urine Microalbumin w/creat. ratio  2. Mixed hyperlipidemia Stable, continue lovastatin  3. Essential hypertension Stable, continue medications as prescribed.   4. Abnormal weight gain Did not tolerate '5mg'$  mounjaro so will stay at lower dose and see how weight loss goes.    General Counseling: yael coppess understanding of the findings of todays visit and agrees with plan of treatment. I have discussed any further diagnostic evaluation that may be needed or ordered today. We also reviewed her medications today. she has been encouraged to call the office with any questions or concerns that should arise related to todays visit.    Orders Placed This Encounter  Procedures   POCT glycosylated hemoglobin (Hb A1C)    Meds  ordered this encounter  Medications  dapagliflozin propanediol (FARXIGA) 10 MG TABS tablet    Sig: Take 1 tablet (10 mg total) by mouth daily.    Dispense:  30 tablet    Refill:  1   tirzepatide (MOUNJARO) 2.5 MG/0.5ML Pen    Sig: Inject 2.5 mg into the skin once a week.    Dispense:  2 mL    Refill:  3    Dx code E11.65    Return in about 14 weeks (around 08/23/2022) for F/U, Recheck A1C, Lynk Marti PCP.   Total time spent:30 Minutes Time spent includes review of chart, medications, test results, and follow up plan with the patient.   [place] Controlled Substance Database was reviewed by me.  This patient was seen by Jonetta Osgood, FNP-C in collaboration with Dr. Clayborn Bigness as a part of collaborative care agreement.   Jesua Tamblyn R. Valetta Fuller, MSN, FNP-C Internal medicine

## 2022-05-19 LAB — MICROALBUMIN / CREATININE URINE RATIO
Creatinine, Urine: 28.3 mg/dL
Microalb/Creat Ratio: 48 mg/g creat — ABNORMAL HIGH (ref 0–29)
Microalbumin, Urine: 13.5 ug/mL

## 2022-05-20 ENCOUNTER — Encounter: Payer: Self-pay | Admitting: Nurse Practitioner

## 2022-05-22 ENCOUNTER — Telehealth: Payer: Self-pay

## 2022-05-22 NOTE — Telephone Encounter (Signed)
Re faxed novo nordisk application again

## 2022-05-22 NOTE — Telephone Encounter (Signed)
Az[employer]me was approved for [place]. Patient notified.

## 2022-06-14 ENCOUNTER — Other Ambulatory Visit: Payer: Self-pay | Admitting: Nurse Practitioner

## 2022-06-16 ENCOUNTER — Encounter: Payer: Self-pay | Admitting: Physician Assistant

## 2022-06-16 ENCOUNTER — Telehealth: Payer: Self-pay | Admitting: Physician Assistant

## 2022-06-16 ENCOUNTER — Ambulatory Visit: Payer: Medicare HMO | Admitting: Physician Assistant

## 2022-06-16 VITALS — BP 140/75 | HR 101 | Temp 98.3°F | Resp 16 | Ht 61.5 in | Wt 149.6 lb

## 2022-06-16 DIAGNOSIS — G4733 Obstructive sleep apnea (adult) (pediatric): Secondary | ICD-10-CM | POA: Diagnosis not present

## 2022-06-16 DIAGNOSIS — I10 Essential (primary) hypertension: Secondary | ICD-10-CM | POA: Diagnosis not present

## 2022-06-16 DIAGNOSIS — Z7189 Other specified counseling: Secondary | ICD-10-CM

## 2022-06-16 DIAGNOSIS — T887XXA Unspecified adverse effect of drug or medicament, initial encounter: Secondary | ICD-10-CM

## 2022-06-16 MED ORDER — ONDANSETRON HCL 4 MG PO TABS: 4.0000 mg | ORAL_TABLET | Freq: Three times a day (TID) | ORAL | 0 refills | Status: DC | PRN

## 2022-06-16 NOTE — Progress Notes (Signed)
[facility] [place], [place] [address]  Pulmonary Sleep Medicine   Office Visit Note  Patient Name: Tiffany Hawkins DOB: 05/15/1945 MRN [id number]  Date of Service: 06/16/2022  Complaints/HPI: Pt is here for routine pulmonary follow up. Needs to switch DME companies due to insurance. Still having difficulty with mask. In need of supplies with switch over to [employer]. Hasn't been able to use machine recently due to not having supplies and can tell the difference. Previously did feel well rested after wearing cpap and benefiting from it. Unfortunately pt is currently not feeling well due to side effect from mounjaro injection on Wednesday causing N/V. She is feeling a little unsteady due to this and her husband is transporting her. Will go ahead and send anti-nausea medication and she will take small sips to keep hydrated. Vomiting has been making her cough some and feels a little more SOB as well.  ROS  General: (-) fever, (-) chills, (-) night sweats, (-) weakness Skin: (-) rashes, (-) itching,. Eyes: (-) visual changes, (-) redness, (-) itching. Nose and Sinuses: (-) nasal stuffiness or itchiness, (-) postnasal drip, (-) nosebleeds, (-) sinus trouble. Mouth and Throat: (-) sore throat, (-) hoarseness. Neck: (-) swollen glands, (-) enlarged thyroid, (-) neck pain. Respiratory: + cough, (-) bloody sputum, + shortness of breath, - wheezing. Cardiovascular: - ankle swelling, (-) chest pain. Lymphatic: (-) lymph node enlargement. Neurologic: (-) numbness, (-) tingling. Psychiatric: (-) anxiety, (-) depression   Current Medication: Outpatient Encounter Medications as of 06/16/2022  Medication Sig   albuterol (VENTOLIN HFA) 108 (90 Base) MCG/ACT inhaler Inhale 2 puffs into the lungs every 6 (six) hours as needed for wheezing or shortness of breath.   amLODipine (NORVASC) 5 MG tablet Take 2 tablets (10 mg total) by mouth daily.   Blood Glucose Monitoring Suppl (TRUE  METRIX METER) w/Device KIT USE AS DIRECTED   cetirizine (ZYRTEC) 10 MG chewable tablet Chew 1 tablet (10 mg total) by mouth daily.   citalopram (CELEXA) 40 MG tablet Take 1 tablet (40 mg total) by mouth daily.   Continuous Blood Gluc Sensor (DEXCOM G7 SENSOR) MISC Apply 1 sensor every 10 days to check glucose levels   dapagliflozin propanediol (FARXIGA) 10 MG TABS tablet Take 1 tablet (10 mg total) by mouth daily.   fenofibrate 54 MG tablet TAKE 1 TABLET EVERY DAY   fluticasone (FLONASE) 50 MCG/ACT nasal spray Place 2 sprays into both nostrils daily.   glipiZIDE (GLUCOTROL XL) 2.5 MG 24 hr tablet Take 1 tablet (2.5 mg total) by mouth daily with breakfast.   ibuprofen (ADVIL) 800 MG tablet Take 1 tablet (800 mg total) by mouth every 8 (eight) hours as needed.   insulin aspart protamine - aspart (NOVOLOG MIX 70/30 FLEXPEN) (70-30) 100 UNIT/ML FlexPen Inject 35 units [place] QAM and inject 40units [place] QPM   lovastatin (MEVACOR) 20 MG tablet TAKE 1 TABLET AT BEDTIME FOR HIGH CHOLESTEROL   Multiple Vitamin (MULTIVITAMIN) tablet Take by mouth daily. TAKES 1/2 TABLET   ondansetron (ZOFRAN) 4 MG tablet Take 1 tablet (4 mg total) by mouth every 8 (eight) hours as needed for nausea or vomiting.   pantoprazole (PROTONIX) 20 MG tablet Take 2 tablets (40 mg total) by mouth daily.   sharps container 1 each by Does not apply route as needed. To use for insulin syringes and needles. Taking insulin at least twice daily.  E11.65   spironolactone (ALDACTONE) 100 MG tablet Take 100 mg by mouth daily. 2  tablets once a day   tirzepatide [facility]) 2.5 MG/0.5ML Pen Inject 2.5 mg into the skin once a week.   TRUE METRIX BLOOD GLUCOSE TEST test strip TEST BLOOD SUGAR THREE TIMES DAILY AFTER MEALS   TRUEplus Lancets 33G MISC USE AS DIRECTED TO TEST BLOOD SUGAR THREE TIMES DAILY   No facility-administered encounter medications on file as of 06/16/2022.    Surgical History: Past Surgical History:  Procedure Laterality Date    APPENDECTOMY     BACK SURGERY     CATARACT EXTRACTION W/PHACO Left 12/03/2017   Procedure: CATARACT EXTRACTION PHACO AND INTRAOCULAR LENS PLACEMENT ([place]) LEFT  DIABETIC;  Surgeon: Eulogio Bear, MD;  Location: [facility];  Service: Ophthalmology;  Laterality: Left;  Diabetic - insulin   CATARACT EXTRACTION W/PHACO Right 01/14/2018   Procedure: CATARACT EXTRACTION PHACO AND INTRAOCULAR LENS PLACEMENT ([facility]) RIGHT DIABETIC;  Surgeon: Eulogio Bear, MD;  Location: [facility];  Service: Ophthalmology;  Laterality: Right;  Diabetic - insulin   CERVICAL DISC SURGERY     CHOLECYSTECTOMY     COLON SURGERY     COLONOSCOPY     COLONOSCOPY WITH PROPOFOL N/A 05/17/2015   Procedure: COLONOSCOPY WITH PROPOFOL;  Surgeon: Manya Silvas, MD;  Location: [facility] ENDOSCOPY;  Service: Endoscopy;  Laterality: N/A;   COLONOSCOPY WITH PROPOFOL N/A 04/01/2018   Procedure: COLONOSCOPY WITH BIOPSY;  Surgeon: [name], MD;  Location: [facility];  Service: Endoscopy;  Laterality: N/A;  Diabetic - insulin   EMBOLIZATION N/A 01/24/2021   Procedure: EMBOLIZATION;  Surgeon: Algernon Huxley, MD;  Location: [facility] CV LAB;  Service: Cardiovascular;  Laterality: N/A;   ESOPHAGOGASTRODUODENOSCOPY     HERNIA REPAIR     LOWER EXTREMITY VENOGRAPHY Left 01/24/2021   Procedure: LOWER EXTREMITY VENOGRAPHY;  Surgeon: Algernon Huxley, MD;  Location: [facility] CV LAB;  Service: Cardiovascular;  Laterality: Left;   POLYPECTOMY N/A 04/01/2018   Procedure: POLYPECTOMY;  Surgeon: [name], MD;  Location: [facility];  Service: Endoscopy;  Laterality: N/A;   PORT A CATH INJECTION ([place] [facility])     Port has been removed   TUBAL LIGATION      Medical History: Past Medical History:  Diagnosis Date   Anxiety    Colon cancer ([place]) 10/12/2014   Stage IIB; had chemo   Diabetes mellitus without complication (HCC)    Hyperlipidemia    Hypertension    Lateral epicondylitis    Personal history  of chemotherapy    Polio    childhood   Skin cancer    Sqamous Cell, Lt Calf   Sleep apnea    no CPAP, sx improved with wt loss    Family History: Family History  Problem Relation Age of Onset   Alzheimer's disease Mother    Heart disease Father    Colon cancer Cousin    Colon cancer Cousin    Cancer Cousin        Brain Tumor   Breast cancer Neg Hx     Social History: Social History   Socioeconomic History   Marital status: Married    Spouse name: Not on file   Number of children: Not on file   Years of education: Not on file   Highest education level: Not on file  Occupational History   Not on file  Tobacco Use   Smoking status: Former    Packs/day: 1.50    Years: 20.00    Total pack years: 30.00  Types: Cigarettes    Quit date: 04/17/1981    Years since quitting: 41.1   Smokeless tobacco: Never  Vaping Use   Vaping Use: Never used  Substance and Sexual Activity   Alcohol use: No   Drug use: No   Sexual activity: Yes  Other Topics Concern   Not on file  Social History Narrative   Not on file   Social Determinants of Health   Financial Resource Strain: Low Risk  (06/22/2021)   Overall Financial Resource Strain (CARDIA)    Difficulty of Paying Living Expenses: Not hard at all  Food Insecurity: Not on file  Transportation Needs: No Transportation Needs (06/22/2021)   PRAPARE - Hydrologist (Medical): No    Lack of Transportation (Non-Medical): No  Physical Activity: Not on file  Stress: Not on file  Social Connections: Not on file  Intimate Partner Violence: Not on file    Vital Signs: Blood pressure (!) 140/75, pulse (!) 101, temperature 98.3 F (36.8 C), resp. rate 16, height 5' 1.5" (1.562 m), weight 149 lb 9.6 oz (67.9 kg), SpO2 98 %.  Examination: General Appearance: The patient is well-developed, well-nourished, and in no distress. Skin: Gross inspection of skin unremarkable. Head: normocephalic, no gross  deformities. Eyes: no gross deformities noted. ENT: ears appear grossly normal no exudates. Neck: Supple. No thyromegaly. No LAD. Respiratory: Lungs clear to auscultation bilaterally. Cardiovascular: Normal S1 and S2 without murmur or rub. Extremities: No cyanosis. pulses are equal. Neurologic: Alert and oriented. No involuntary movements.  LABS: Recent Results (from the past 2160 hour(s))  CBC with Differential/Platelet     Status: None   Collection Time: 05/12/22 10:05 AM  Result Value Ref Range   WBC 8.1 3.4 - 10.8 x10E3/uL   RBC 5.02 3.77 - 5.28 x10E6/uL   Hemoglobin 14.5 11.1 - 15.9 g/dL   Hematocrit 45.6 34.0 - 46.6 %   MCV 91 79 - 97 fL   MCH 28.9 26.6 - 33.0 pg   MCHC 31.8 31.5 - 35.7 g/dL   RDW 13.8 11.7 - 15.4 %   Platelets 216 150 - 450 x10E3/uL   Neutrophils 65 Not Estab. %   Lymphs 21 Not Estab. %   Monocytes 10 Not Estab. %   Eos 3 Not Estab. %   Basos 0 Not Estab. %   Neutrophils Absolute 5.3 1.4 - 7.0 x10E3/uL   Lymphocytes Absolute 1.7 0.7 - 3.1 x10E3/uL   Monocytes Absolute 0.8 0.1 - 0.9 x10E3/uL   EOS (ABSOLUTE) 0.2 0.0 - 0.4 x10E3/uL   Basophils Absolute 0.0 0.0 - 0.2 x10E3/uL   Immature Granulocytes 1 Not Estab. %   Immature Grans (Abs) 0.1 0.0 - 0.1 x10E3/uL  CMP14+EGFR     Status: Abnormal   Collection Time: 05/12/22 10:05 AM  Result Value Ref Range   Glucose 100 (H) 70 - 99 mg/dL   BUN 30 (H) 8 - 27 mg/dL   Creatinine, Ser 1.26 (H) 0.57 - 1.00 mg/dL   eGFR 44 (L) >59 mL/min/1.73   BUN/Creatinine Ratio 24 12 - 28   Sodium 142 134 - 144 mmol/L   Potassium 5.2 3.5 - 5.2 mmol/L   Chloride 105 96 - 106 mmol/L   CO2 21 20 - 29 mmol/L   Calcium 10.0 8.7 - 10.3 mg/dL   Total Protein 7.0 6.0 - 8.5 g/dL   Albumin 4.8 3.8 - 4.8 g/dL   Globulin, Total 2.2 1.5 - 4.5 g/dL   Albumin/Globulin Ratio 2.2  1.2 - 2.2   Bilirubin Total 0.3 0.0 - 1.2 mg/dL   Alkaline Phosphatase 38 (L) 44 - 121 IU/L   AST 15 0 - 40 IU/L   ALT 22 0 - 32 IU/L  Lipid Profile      Status: Abnormal   Collection Time: 05/12/22 10:05 AM  Result Value Ref Range   Cholesterol, Total 164 100 - 199 mg/dL   Triglycerides 160 (H) 0 - 149 mg/dL   HDL 38 (L) >39 mg/dL   VLDL Cholesterol Cal 28 5 - 40 mg/dL   LDL Chol Calc (NIH) 98 0 - 99 mg/dL   Chol/HDL Ratio 4.3 0.0 - 4.4 ratio    Comment:                                   T. Chol/HDL Ratio                                             Men  Women                               1/2 Avg.Risk  3.4    3.3                                   Avg.Risk  5.0    4.4                                2X Avg.Risk  9.6    7.1                                3X Avg.Risk 23.4   11.0   POCT glycosylated hemoglobin (Hb A1C)     Status: Abnormal   Collection Time: 05/17/22 10:51 AM  Result Value Ref Range   Hemoglobin A1C 6.1 (A) 4.0 - 5.6 %   HbA1c POC (<> result, manual entry)     HbA1c, POC (prediabetic range)     HbA1c, POC (controlled diabetic range)    Urine Microalbumin w/creat. ratio     Status: Abnormal   Collection Time: 05/17/22  4:00 PM  Result Value Ref Range   Creatinine, Urine 28.3 Not Estab. mg/dL   Microalbumin, Urine 13.5 Not Estab. ug/mL   Microalb/Creat Ratio 48 (H) 0 - 29 mg/g creat    Comment:                        Normal:                0 -  29                        Moderately increased: 30 - 300                        Severely increased:       >300     Radiology: PERIPHERAL VASCULAR CATHETERIZATION  Result Date: 01/24/2021 See surgical note for result.   No results found.  No results found.    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/14/2019   Gastroesophageal reflux disease without esophagitis 03/14/2019   Intractable episodic headache 03/14/2019   Need for prophylactic vaccination with combined diphtheria-tetanus-pertussis (DTaP) vaccine 03/14/2019   Renal cyst 11/27/2018   Abnormality of gait and mobility 11/27/2018   Hepatic steatosis 10/31/2018   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   Elevated ferritin 10/31/2018   Vertigo 08/13/2018   Gastroenteritis 05/09/2018   Diarrhea 05/09/2018   Nausea 05/09/2018   Personal history of colon cancer    Polyp of sigmoid colon    Dysuria 02/23/2018   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 02/10/2018   Encounter for general adult medical examination with abnormal findings 02/10/2018   Acute non-recurrent pansinusitis 08/24/2017   Other headache syndrome 08/24/2017   Uncontrolled type 2 diabetes mellitus with hyperglycemia ([place]) 08/24/2017   Type 2 diabetes mellitus without complication, with long-term current use of insulin ([place]) 08/22/2017   Need for vaccination against Streptococcus pneumoniae using pneumococcal conjugate vaccine 13 08/22/2017   Essential hypertension 05/03/2017   Otalgia, bilateral 05/02/2017   Type 2 diabetes mellitus with hyperglycemia ([place]) 05/02/2017   Mixed hyperlipidemia 05/02/2017   Right lower quadrant pain 06/29/2016   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 Obstructive sleep apnea Continue nightly cpap use - For home use only DME continuous positive airway pressure (CPAP)  2. CPAP use counseling CPAP couseling-Discussed importance of adequate CPAP use as well as proper care and cleaning techniques of machine and all supplies.  3. Medication side effect Will send zofran to help with nausea and she has  discontinued the mounjaro and will follow up with PCP who is aware  4. Essential hypertension Borderline BP and HR in office likely due to feeling unwell and she will monitor   General Counseling: I have discussed the findings of the evaluation and examination with Ivin Booty.  I have also discussed any further diagnostic evaluation thatmay be needed or ordered today. Daelynn verbalizes understanding of the findings of todays visit. We also reviewed her medications today and discussed drug interactions and side effects including but not limited excessive drowsiness and altered mental states. We also discussed that there is always a risk not just to her but also people around her. she has been encouraged to call the office with any questions or concerns that should arise related to todays visit.  Orders Placed This Encounter  Procedures   For home use only DME continuous positive airway pressure (CPAP)    In need of supplies, switching to [employer]    Order Specific Question:   Length of Need    Answer:   Lifetime    Order Specific Question:   Patient has OSA or probable OSA    Answer:   Yes    Order Specific Question:   Is the patient currently using CPAP in the home  Answer:   Yes    Order Specific Question:   Settings    Answer:   Other see comments    Order Specific Question:   CPAP supplies needed    Answer:   Mask, headgear, cushions, filters, heated tubing and water chamber     Time spent: 30  I have personally obtained a history, examined the patient, evaluated laboratory and imaging results, formulated the assessment and plan and placed orders. This patient was seen by Drema Dallas, PA-C in collaboration with Dr. Devona Konig as a part of collaborative care agreement.     Allyne Gee, MD [facility] Pulmonary and Critical Care Sleep medicine

## 2022-06-16 NOTE — Telephone Encounter (Signed)
Notified [employer] about cpap order, and requested download prior to 09/29/22 follow up-Toni

## 2022-06-20 ENCOUNTER — Other Ambulatory Visit: Payer: Self-pay | Admitting: Nurse Practitioner

## 2022-07-04 ENCOUNTER — Telehealth: Payer: Self-pay

## 2022-07-04 NOTE — Telephone Encounter (Signed)
Notified patient that her patient assistance Novolog is ready for pickup.

## 2022-07-31 ENCOUNTER — Ambulatory Visit
Admission: RE | Admit: 2022-07-31 | Discharge: 2022-07-31 | Disposition: A | Discharge status: Home / Self Care | Payer: Medicare HMO | Source: Ambulatory Visit | Attending: Nurse Practitioner | Admitting: Nurse Practitioner

## 2022-07-31 DIAGNOSIS — Z1231 Encounter for screening mammogram for malignant neoplasm of breast: Secondary | ICD-10-CM | POA: Diagnosis not present

## 2022-08-24 ENCOUNTER — Telehealth: Payer: Self-pay | Admitting: Nurse Practitioner

## 2022-08-24 ENCOUNTER — Ambulatory Visit (INDEPENDENT_AMBULATORY_CARE_PROVIDER_SITE_OTHER): Payer: Medicare HMO | Admitting: Nurse Practitioner

## 2022-08-24 ENCOUNTER — Encounter: Payer: Self-pay | Admitting: Nurse Practitioner

## 2022-08-24 VITALS — BP 136/78 | HR 96 | Temp 98.1°F | Resp 16 | Ht 61.5 in | Wt 147.8 lb

## 2022-08-24 DIAGNOSIS — I10 Essential (primary) hypertension: Secondary | ICD-10-CM

## 2022-08-24 DIAGNOSIS — E1165 Type 2 diabetes mellitus with hyperglycemia: Secondary | ICD-10-CM | POA: Diagnosis not present

## 2022-08-24 DIAGNOSIS — E782 Mixed hyperlipidemia: Secondary | ICD-10-CM

## 2022-08-24 DIAGNOSIS — L03811 Cellulitis of head [any part, except face]: Secondary | ICD-10-CM

## 2022-08-24 LAB — POCT GLYCOSYLATED HEMOGLOBIN (HGB A1C): Hemoglobin A1C: 6.2 % — AB (ref 4.0–5.6)

## 2022-08-24 MED ORDER — SULFAMETHOXAZOLE-TRIMETHOPRIM 800-160 MG PO TABS: 1.0000 | ORAL_TABLET | Freq: Two times a day (BID) | ORAL | 0 refills | Status: AC

## 2022-08-24 NOTE — Progress Notes (Signed)
[facility] [address] [place], [place] [address]  Internal MEDICINE  Office Visit Note  Patient Name: Tiffany Hawkins  [id number]  [id number]  Date of Service: 08/24/2022  Chief Complaint  Patient presents with   Diabetes   Hypertension   Hyperlipidemia   Follow-up    HPI Tiffany Hawkins presents for a follow-up visit for diabetes, hypertension, high cholesterol and a bump on her scalp.  Diabetes -- A1c 6.2, stable, no significant change. Takes farxiga, glipizide, and mounjaro.  Bump on scalp -- 2 cm in diameter and soft, but firm and round. Possible cyst or abscess.  Hypertension -- controlled by current medications, taking amlodipine Hyperlipidemia -- taking fenofibrate and lovastatin.     Current Medication: Outpatient Encounter Medications as of 08/24/2022  Medication Sig   albuterol (VENTOLIN HFA) 108 (90 Base) MCG/ACT inhaler Inhale 2 puffs into the lungs every 6 (six) hours as needed for wheezing or shortness of breath.   amLODipine (NORVASC) 5 MG tablet Take 2 tablets (10 mg total) by mouth daily.   Blood Glucose Monitoring Suppl (TRUE METRIX METER) w/Device KIT USE AS DIRECTED   cetirizine (ZYRTEC) 10 MG chewable tablet Chew 1 tablet (10 mg total) by mouth daily.   citalopram (CELEXA) 40 MG tablet TAKE 1 TABLET EVERY DAY   Continuous Blood Gluc Sensor (DEXCOM G7 SENSOR) MISC Apply 1 sensor every 10 days to check glucose levels   dapagliflozin propanediol (FARXIGA) 10 MG TABS tablet Take 1 tablet (10 mg total) by mouth daily.   fenofibrate 54 MG tablet TAKE 1 TABLET EVERY DAY   fluticasone (FLONASE) 50 MCG/ACT nasal spray Place 2 sprays into both nostrils daily.   glipiZIDE (GLUCOTROL XL) 2.5 MG 24 hr tablet Take 1 tablet (2.5 mg total) by mouth daily with breakfast.   ibuprofen (ADVIL) 800 MG tablet Take 1 tablet (800 mg total) by mouth every 8 (eight) hours as needed.   insulin aspart protamine - aspart (NOVOLOG MIX 70/30 FLEXPEN) (70-30) 100 UNIT/ML FlexPen  Inject 35 units Brodnax QAM and inject 40units Martha QPM   lovastatin (MEVACOR) 20 MG tablet TAKE 1 TABLET AT BEDTIME FOR HIGH CHOLESTEROL   Multiple Vitamin (MULTIVITAMIN) tablet Take by mouth daily. TAKES 1/2 TABLET   ondansetron (ZOFRAN) 4 MG tablet Take 1 tablet (4 mg total) by mouth every 8 (eight) hours as needed for nausea or vomiting.   pantoprazole (PROTONIX) 20 MG tablet Take 2 tablets (40 mg total) by mouth daily.   sharps container 1 each by Does not apply route as needed. To use for insulin syringes and needles. Taking insulin at least twice daily.  E11.65   spironolactone (ALDACTONE) 100 MG tablet Take 100 mg by mouth daily. 2 tablets once a day   sulfamethoxazole-trimethoprim (BACTRIM DS) 800-160 MG tablet Take 1 tablet by mouth 2 (two) times daily for 7 days. Take with food   tirzepatide [facility]) 2.5 MG/0.5ML Pen Inject 2.5 mg into the skin once a week.   TRUE METRIX BLOOD GLUCOSE TEST test strip TEST BLOOD SUGAR THREE TIMES DAILY AFTER MEALS   TRUEplus Lancets 33G MISC USE AS DIRECTED TO TEST BLOOD SUGAR THREE TIMES DAILY   No facility-administered encounter medications on file as of 08/24/2022.    Surgical History: Past Surgical History:  Procedure Laterality Date   APPENDECTOMY     BACK SURGERY     CATARACT EXTRACTION W/PHACO Left 12/03/2017   Procedure: CATARACT EXTRACTION PHACO AND INTRAOCULAR LENS PLACEMENT (IOC) LEFT  DIABETIC;  Surgeon: Willey Blade  Loraine Leriche, MD;  Location: [facility];  Service: Ophthalmology;  Laterality: Left;  Diabetic - insulin   CATARACT EXTRACTION W/PHACO Right 01/14/2018   Procedure: CATARACT EXTRACTION PHACO AND INTRAOCULAR LENS PLACEMENT (IOC) RIGHT DIABETIC;  Surgeon: Nevada Crane, MD;  Location: [facility];  Service: Ophthalmology;  Laterality: Right;  Diabetic - insulin   CERVICAL DISC SURGERY     CHOLECYSTECTOMY     COLON SURGERY     COLONOSCOPY     COLONOSCOPY WITH PROPOFOL N/A 05/17/2015   Procedure: COLONOSCOPY WITH  PROPOFOL;  Surgeon: Scot Jun, MD;  Location: [facility] END[facility];  Service: Endoscopy;  Laterality: N/A;   COLONOSCOPY WITH PROPOFOL N/A 04/01/2018   Procedure: COLONOSCOPY WITH BIOPSY;  Surgeon: Midge Minium, MD;  Location: [facility];  Service: Endoscopy;  Laterality: N/A;  Diabetic - insulin   EMBOLIZATION N/A 01/24/2021   Procedure: EMBOLIZATION;  Surgeon: Annice Needy, MD;  Location: [facility] INVASIVE CV LAB;  Service: Cardiovascular;  Laterality: N/A;   ESOPHAGOGASTRODUODENOSCOPY     HERNIA REPAIR     LOWER EXTREMITY VENOGRAPHY Left 01/24/2021   Procedure: LOWER EXTREMITY VENOGRAPHY;  Surgeon: Annice Needy, MD;  Location: [facility] INVASIVE CV LAB;  Service: Cardiovascular;  Laterality: Left;   POLYPECTOMY N/A 04/01/2018   Procedure: POLYPECTOMY;  Surgeon: Midge Minium, MD;  Location: [facility];  Service: Endoscopy;  Laterality: N/A;   PORT A CATH INJECTION ([facility] HX)     Port has been removed   TUBAL LIGATION      Medical History: Past Medical History:  Diagnosis Date   Anxiety    Colon cancer (HCC) 10/12/2014   Stage IIB; had chemo   Diabetes mellitus without complication (HCC)    Hyperlipidemia    Hypertension    Lateral epicondylitis    Personal history of chemotherapy    Polio    childhood   Skin cancer    Sqamous Cell, Lt Calf   Sleep apnea    no CPAP, sx improved with wt loss    Family History: Family History  Problem Relation Age of Onset   Alzheimer's disease Mother    Heart disease Father    Colon cancer Cousin    Colon cancer Cousin    Cancer Cousin        Brain Tumor   Breast cancer Neg Hx     Social History   Socioeconomic History   Marital status: Married    Spouse name: Not on file   Number of children: Not on file   Years of education: Not on file   Highest education level: Not on file  Occupational History   Not on file  Tobacco Use   Smoking status: Former    Packs/day: 1.50    Years: 20.00    Additional pack years: 0.00     Total pack years: 30.00    Types: Cigarettes    Quit date: 04/17/1981    Years since quitting: 41.3   Smokeless tobacco: Never  Vaping Use   Vaping Use: Never used  Substance and Sexual Activity   Alcohol use: No   Drug use: No   Sexual activity: Yes  Other Topics Concern   Not on file  Social History Narrative   Not on file   Social Determinants of Health   Financial Resource Strain: Low Risk  (06/22/2021)   Overall Financial Resource Strain (CARDIA)    Difficulty of Paying Living Expenses: Not hard at all  Food Insecurity: Not on  file  Transportation Needs: No Transportation Needs (06/22/2021)   PRAPARE - Administrator, Civil Service (Medical): No    Lack of Transportation (Non-Medical): No  Physical Activity: Not on file  Stress: Not on file  Social Connections: Not on file  Intimate Partner Violence: Not on file      Review of Systems  Constitutional:  Negative for chills, fatigue and unexpected weight change.  HENT:  Positive for postnasal drip. Negative for congestion, rhinorrhea, sneezing and sore throat.   Eyes:  Negative for redness.  Respiratory: Negative.  Negative for cough, chest tightness, shortness of breath and wheezing.   Cardiovascular: Negative.  Negative for chest pain and palpitations.  Gastrointestinal:  Positive for nausea and vomiting. Negative for abdominal pain, constipation and diarrhea.  Genitourinary:  Negative for dysuria and frequency.  Musculoskeletal:  Negative for arthralgias, back pain, joint swelling and neck pain.  Skin:  Negative for rash.       2 cm bump on scalp  Neurological:  Negative for tremors and numbness.  Hematological:  Negative for adenopathy. Does not bruise/bleed easily.  Psychiatric/Behavioral:  Negative for behavioral problems (Depression), sleep disturbance and suicidal ideas. The patient is not nervous/anxious.     Vital Signs: BP 136/78   Pulse 96   Temp 98.1 F (36.7 C)   Resp 16   Ht 5' 1.5"  (1.562 m)   Wt 147 lb 12.8 oz (67 kg)   SpO2 94%   BMI 27.47 kg/m    Physical Exam Vitals reviewed.  Constitutional:      General: She is not in acute distress.    Appearance: Normal appearance. She is normal weight. She is not ill-appearing.  HENT:     Head: Normocephalic and atraumatic.  Eyes:     Pupils: Pupils are equal, round, and reactive to light.  Cardiovascular:     Rate and Rhythm: Normal rate and regular rhythm.  Pulmonary:     Effort: Pulmonary effort is normal. No respiratory distress.  Skin:    Findings: Abscess (on scalp, 2 cm in diameter, soft but firm and round, most likely a cyst that is becoming an abscess.) present.  Neurological:     Mental Status: She is alert and oriented to person, place, and time.     Cranial Nerves: No cranial nerve deficit.     Coordination: Coordination normal.     Gait: Gait normal.  Psychiatric:        Mood and Affect: Mood normal.        Behavior: Behavior normal.        Assessment/Plan: 1. Uncontrolled type 2 diabetes mellitus with hyperglycemia (HCC) A1c stable, no significant change. Continue farxiga, glipizide, and mounjaro.  - POCT glycosylated hemoglobin (Hb A1C)  2. Abscess or cellulitis of scalp Prescribed bactrim to treat for possible developing abscess/cellulitis - sulfamethoxazole-trimethoprim (BACTRIM DS) 800-160 MG tablet; Take 1 tablet by mouth 2 (two) times daily for 7 days. Take with food  Dispense: 14 tablet; Refill: 0  3. Essential hypertension Continue amlodipine as prescribed.  4. Mixed hyperlipidemia Continue fenofibrate and lovastatin as prescribed.     General Counseling: Tiffany Hawkins understanding of the findings of todays visit and agrees with plan of treatment. I have discussed any further diagnostic evaluation that may be needed or ordered today. We also reviewed her medications today. she has been encouraged to call the office with any questions or concerns that should arise related  to todays visit.    Orders  Placed This Encounter  Procedures   POCT glycosylated hemoglobin (Hb A1C)    Meds ordered this encounter  Medications   sulfamethoxazole-trimethoprim (BACTRIM DS) 800-160 MG tablet    Sig: Take 1 tablet by mouth 2 (two) times daily for 7 days. Take with food    Dispense:  14 tablet    Refill:  0    Return in about 7 months (around 03/13/2023) for need medicare wellness visit and a1c check , Flower Franko PCP.   Total time spent:30 Minutes Time spent includes review of chart, medications, test results, and follow up plan with the patient.   [place] Controlled Substance Database was reviewed by me.  This patient was seen by Sallyanne Kuster, FNP-C in collaboration with Dr. Beverely Risen as a part of collaborative care agreement.   Leonia Heatherly R. Tedd Sias, MSN, FNP-C Internal medicine

## 2022-08-24 NOTE — Telephone Encounter (Signed)
Lvm for Melissa with [employer] to return my call regarding order sent to them 06/16/22-Toni

## 2022-09-11 ENCOUNTER — Encounter: Payer: Self-pay | Admitting: Nurse Practitioner

## 2022-09-14 ENCOUNTER — Ambulatory Visit: Payer: Medicare HMO | Admitting: Physician Assistant

## 2022-09-15 ENCOUNTER — Telehealth: Payer: Self-pay | Admitting: Nurse Practitioner

## 2022-09-15 NOTE — Telephone Encounter (Signed)
Per Elige Radon with [employer]. Cpap supplies delivered 09/12/22-Toni

## 2022-09-29 ENCOUNTER — Ambulatory Visit: Payer: Medicare HMO | Admitting: Physician Assistant

## 2022-10-01 ENCOUNTER — Other Ambulatory Visit: Payer: Self-pay | Admitting: Nurse Practitioner

## 2022-10-01 DIAGNOSIS — Z760 Encounter for issue of repeat prescription: Secondary | ICD-10-CM

## 2022-10-02 ENCOUNTER — Ambulatory Visit: Payer: Medicare HMO | Admitting: Physician Assistant

## 2022-10-02 ENCOUNTER — Encounter: Payer: Self-pay | Admitting: Physician Assistant

## 2022-10-02 VITALS — BP 120/75 | HR 97 | Temp 98.1°F | Resp 16 | Ht 61.5 in | Wt 149.8 lb

## 2022-10-02 DIAGNOSIS — G4733 Obstructive sleep apnea (adult) (pediatric): Secondary | ICD-10-CM | POA: Diagnosis not present

## 2022-10-02 DIAGNOSIS — I10 Essential (primary) hypertension: Secondary | ICD-10-CM | POA: Diagnosis not present

## 2022-10-02 DIAGNOSIS — Z7189 Other specified counseling: Secondary | ICD-10-CM | POA: Diagnosis not present

## 2022-10-02 NOTE — Progress Notes (Signed)
[facility] [address] [place], [place] [address]  Pulmonary Sleep Medicine   Office Visit Note  Patient Name: Tiffany Hawkins DOB: 1946/02/14 MRN [id number]  Date of Service: 10/02/2022  Complaints/HPI: Pt is here for routine pulmonary follow up. Finally received supplies from [employer], but unfortunately her mask is too big. Has been calling [employer] and is setting up appt to go for mask fitting. Not able to use recently due to leaking, but is eager to start using again. She does get some dry mouth at night and likely sleeps with mouth open and will benefit from FF mask option. She does have some shoulder aches and is thinking about seeing ortho/PT for both shoulder and increasing strength in legs as well. Will discuss with PCP.  ROS  General: (-) fever, (-) chills, (-) night sweats, (-) weakness Skin: (-) rashes, (-) itching,. Eyes: (-) visual changes, (-) redness, (-) itching. Nose and Sinuses: (-) nasal stuffiness or itchiness, (-) postnasal drip, (-) nosebleeds, (-) sinus trouble. Mouth and Throat: (-) sore throat, (-) hoarseness. Neck: (-) swollen glands, (-) enlarged thyroid, (-) neck pain. Respiratory: - cough, (-) bloody sputum, - shortness of breath, - wheezing. Cardiovascular: - ankle swelling, (-) chest pain. Lymphatic: (-) lymph node enlargement. Neurologic: (-) numbness, (-) tingling. Psychiatric: (-) anxiety, (-) depression   Current Medication: Outpatient Encounter Medications as of 10/02/2022  Medication Sig   albuterol (VENTOLIN HFA) 108 (90 Base) MCG/ACT inhaler Inhale 2 puffs into the lungs every 6 (six) hours as needed for wheezing or shortness of breath.   amLODipine (NORVASC) 5 MG tablet Take 2 tablets (10 mg total) by mouth daily.   Blood Glucose Monitoring Suppl (TRUE METRIX METER) w/Device KIT USE AS DIRECTED   cetirizine (ZYRTEC) 10 MG chewable tablet Chew 1 tablet (10 mg total) by mouth daily.   citalopram (CELEXA) 40 MG tablet TAKE 1 TABLET  EVERY DAY   Continuous Blood Gluc Sensor (DEXCOM G7 SENSOR) MISC Apply 1 sensor every 10 days to check glucose levels   dapagliflozin propanediol (FARXIGA) 10 MG TABS tablet Take 1 tablet (10 mg total) by mouth daily.   fenofibrate 54 MG tablet TAKE 1 TABLET EVERY DAY   fluticasone (FLONASE) 50 MCG/ACT nasal spray Place 2 sprays into both nostrils daily.   glipiZIDE (GLUCOTROL XL) 2.5 MG 24 hr tablet Take 1 tablet (2.5 mg total) by mouth daily with breakfast.   ibuprofen (ADVIL) 800 MG tablet Take 1 tablet (800 mg total) by mouth every 8 (eight) hours as needed.   insulin aspart protamine - aspart (NOVOLOG MIX 70/30 FLEXPEN) (70-30) 100 UNIT/ML FlexPen Inject 35 units Delta QAM and inject 40units El[place]a QPM   lovastatin (MEVACOR) 20 MG tablet TAKE 1 TABLET AT BEDTIME FOR HIGH CHOLESTEROL   Multiple Vitamin (MULTIVITAMIN) tablet Take by mouth daily. TAKES 1/2 TABLET   ondansetron (ZOFRAN) 4 MG tablet Take 1 tablet (4 mg total) by mouth every 8 (eight) hours as needed for nausea or vomiting.   pantoprazole (PROTONIX) 20 MG tablet TAKE 2 TABLETS EVERY DAY   sharps container 1 each by Does not apply route as needed. To use for insulin syringes and needles. Taking insulin at least twice daily.  E11.65   spironolactone (ALDACTONE) 100 MG tablet Take 100 mg by mouth daily. 2 tablets once a day   tirzepatide [facility]) 2.5 MG/0.5ML Pen Inject 2.5 mg into the skin once a week.   TRUE METRIX BLOOD GLUCOSE TEST test strip TEST BLOOD SUGAR THREE TIMES DAILY  AFTER MEALS   TRUEplus Lancets 33G MISC USE AS DIRECTED TO TEST BLOOD SUGAR THREE TIMES DAILY   No facility-administered encounter medications on file as of 10/02/2022.    Surgical History: Past Surgical History:  Procedure Laterality Date   APPENDECTOMY     BACK SURGERY     CATARACT EXTRACTION W/PHACO Left 12/03/2017   Procedure: CATARACT EXTRACTION PHACO AND INTRAOCULAR LENS PLACEMENT (IOC) LEFT  DIABETIC;  Surgeon: Nevada Crane, MD;  Location:  [facility];  Service: Ophthalmology;  Laterality: Left;  Diabetic - insulin   CATARACT EXTRACTION W/PHACO Right 01/14/2018   Procedure: CATARACT EXTRACTION PHACO AND INTRAOCULAR LENS PLACEMENT (IOC) RIGHT DIABETIC;  Surgeon: Nevada Crane, MD;  Location: [facility];  Service: Ophthalmology;  Laterality: Right;  Diabetic - insulin   CERVICAL DISC SURGERY     CHOLECYSTECTOMY     COLON SURGERY     COLONOSCOPY     COLONOSCOPY WITH PROPOFOL N/A 05/17/2015   Procedure: COLONOSCOPY WITH PROPOFOL;  Surgeon: Scot Jun, MD;  Location: [facility] ENDOSCOPY;  Service: Endoscopy;  Laterality: N/A;   COLONOSCOPY WITH PROPOFOL N/A 04/01/2018   Procedure: COLONOSCOPY WITH BIOPSY;  Surgeon: Midge Minium, MD;  Location: [facility];  Service: Endoscopy;  Laterality: N/A;  Diabetic - insulin   EMBOLIZATION N/A 01/24/2021   Procedure: EMBOLIZATION;  Surgeon: Annice Needy, MD;  Location: [facility] INVASIVE CV LAB;  Service: Cardiovascular;  Laterality: N/A;   ESOPHAGOGASTRODUODENOSCOPY     HERNIA REPAIR     LOWER EXTREMITY VENOGRAPHY Left 01/24/2021   Procedure: LOWER EXTREMITY VENOGRAPHY;  Surgeon: Annice Needy, MD;  Location: [facility] INVASIVE CV LAB;  Service: Cardiovascular;  Laterality: Left;   POLYPECTOMY N/A 04/01/2018   Procedure: POLYPECTOMY;  Surgeon: Midge Minium, MD;  Location: [facility];  Service: Endoscopy;  Laterality: N/A;   PORT A CATH INJECTION ([facility] HX)     Port has been removed   TUBAL LIGATION      Medical History: Past Medical History:  Diagnosis Date   Anxiety    Colon cancer (HCC) 10/12/2014   Stage IIB; had chemo   Diabetes mellitus without complication (HCC)    Hyperlipidemia    Hypertension    Lateral epicondylitis    Personal history of chemotherapy    Polio    childhood   Skin cancer    Sqamous Cell, Lt Calf   Sleep apnea    no CPAP, sx improved with wt loss    Family History: Family History  Problem Relation Age of Onset    Alzheimer's disease Mother    Heart disease Father    Colon cancer Cousin    Colon cancer Cousin    Cancer Cousin        Brain Tumor   Breast cancer Neg Hx     Social History: Social History   Socioeconomic History   Marital status: Married    Spouse name: Not on file   Number of children: Not on file   Years of education: Not on file   Highest education level: Not on file  Occupational History   Not on file  Tobacco Use   Smoking status: Former    Packs/day: 1.50    Years: 20.00    Additional pack years: 0.00    Total pack years: 30.00    Types: Cigarettes    Quit date: 04/17/1981    Years since quitting: 41.4   Smokeless tobacco: Never  Vaping Use   Vaping Use:  Never used  Substance and Sexual Activity   Alcohol use: No   Drug use: No   Sexual activity: Yes  Other Topics Concern   Not on file  Social History Narrative   Not on file   Social Determinants of Health   Financial Resource Strain: Low Risk  (06/22/2021)   Overall Financial Resource Strain (CARDIA)    Difficulty of Paying Living Expenses: Not hard at all  Food Insecurity: Not on file  Transportation Needs: No Transportation Needs (06/22/2021)   PRAPARE - Administrator, Civil Service (Medical): No    Lack of Transportation (Non-Medical): No  Physical Activity: Not on file  Stress: Not on file  Social Connections: Not on file  Intimate Partner Violence: Not on file    Vital Signs: Blood pressure 120/75, pulse 97, temperature 98.1 F (36.7 C), resp. rate 16, height 5' 1.5" (1.562 m), weight 149 lb 12.8 oz (67.9 kg), SpO2 95 %.  Examination: General Appearance: The patient is well-developed, well-nourished, and in no distress. Skin: Gross inspection of skin unremarkable. Head: normocephalic, no gross deformities. Eyes: no gross deformities noted. ENT: ears appear grossly normal no exudates. Neck: Supple. No thyromegaly. No LAD. Respiratory: Lungs clear to auscultation  bilaterally. Cardiovascular: Normal S1 and S2 without murmur or rub. Extremities: No cyanosis. pulses are equal. Neurologic: Alert and oriented. No involuntary movements.  LABS: Recent Results (from the past 2160 hour(s))  POCT glycosylated hemoglobin (Hb A1C)     Status: Abnormal   Collection Time: 08/24/22 11:07 AM  Result Value Ref Range   Hemoglobin A1C 6.2 (A) 4.0 - 5.6 %   HbA1c POC (<> result, manual entry)     HbA1c, POC (prediabetic range)     HbA1c, POC (controlled diabetic range)      Radiology: MM 3D SCREEN BREAST BILATERAL  Result Date: 08/01/2022 CLINICAL DATA:  Screening. EXAM: DIGITAL SCREENING BILATERAL MAMMOGRAM WITH TOMOSYNTHESIS AND CAD TECHNIQUE: Bilateral screening digital craniocaudal and mediolateral oblique mammograms were obtained. Bilateral screening digital breast tomosynthesis was performed. The images were evaluated with computer-aided detection. COMPARISON:  Previous exam(s). ACR Breast Density Category c: The breasts are heterogeneously dense, which may obscure small masses. FINDINGS: There are no findings suspicious for malignancy. IMPRESSION: No mammographic evidence of malignancy. A result letter of this screening mammogram will be mailed directly to the patient. RECOMMENDATION: Screening mammogram in one year. (Code:SM-B-01Y) BI-RADS CATEGORY  1: Negative. Electronically Signed   By: Annia Belt M.D.   On: 08/01/2022 12:45    No results found.  No results found.    Assessment and Plan: Patient Active Problem List   Diagnosis Date Noted   Aortic atherosclerosis (HCC) 12/29/2020   Deformity of toe of left foot 03/28/2020   H/O multiple pulmonary nodules 09/22/2019   Chronic cough 09/22/2019   Perennial allergic rhinitis 09/17/2019   Acute upper respiratory infection 08/28/2019   Weakness 08/28/2019   Acute bronchitis 06/01/2019   Cough productive of yellow sputum 06/01/2019   Vasomotor rhinitis 05/01/2019   Chronic kidney disease, stage II  (mild) 03/14/2019   Gastroesophageal reflux disease without esophagitis 03/14/2019   Intractable episodic headache 03/14/2019   Need for prophylactic vaccination with combined diphtheria-tetanus-pertussis (DTaP) vaccine 03/14/2019   Renal cyst 11/27/2018   Abnormality of gait and mobility 11/27/2018   Hepatic steatosis 10/31/2018   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/31/2018   Elevated ferritin 10/31/2018   Vertigo 08/13/2018   Gastroenteritis 05/09/2018   Diarrhea 05/09/2018   Nausea  05/09/2018   Personal history of colon cancer    Polyp of sigmoid colon    Dysuria 02/23/2018   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 02/10/2018   Encounter for general adult medical examination with abnormal findings 02/10/2018   Acute non-recurrent pansinusitis 08/24/2017   Other headache syndrome 08/24/2017   Uncontrolled type 2 diabetes mellitus with hyperglycemia (HCC) 08/24/2017   Type 2 diabetes mellitus without complication, with long-term current use of insulin (HCC) 08/22/2017   Need for vaccination against Streptococcus pneumoniae using pneumococcal conjugate vaccine 13 08/22/2017   Essential hypertension 05/03/2017   Otalgia, bilateral 05/02/2017   Type 2 diabetes mellitus with hyperglycemia (HCC) 05/02/2017   Mixed hyperlipidemia 05/02/2017   Right lower quadrant pain 06/29/2016   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 Obstructive sleep apnea Continue nightly use  2. CPAP use counseling CPAP couseling-Discussed importance of adequate CPAP use as well as proper care and cleaning techniques of machine and all supplies.  3. Essential hypertension Continue current medication and f/u with PCP.   General Counseling: I have discussed the findings of the evaluation and examination with Jasmine December.  I have also discussed any further diagnostic evaluation  thatmay be needed or ordered today. Rielynn verbalizes understanding of the findings of todays visit. We also reviewed her medications today and discussed drug interactions and side effects including but not limited excessive drowsiness and altered mental states. We also discussed that there is always a risk not just to her but also people around her. she has been encouraged to call the office with any questions or concerns that should arise related to todays visit.  No orders of the defined types were placed in this encounter.    Time spent: 30  I have personally obtained a history, examined the patient, evaluated laboratory and imaging results, formulated the assessment and plan and placed orders. This patient was seen by Lynn Ito, PA-C in collaboration with Dr. Freda Munro as a part of collaborative care agreement.     Yevonne Pax, MD [facility] Pulmonary and Critical Care Sleep medicine

## 2022-10-04 ENCOUNTER — Other Ambulatory Visit: Payer: Self-pay | Admitting: Nurse Practitioner

## 2022-10-04 DIAGNOSIS — Z760 Encounter for issue of repeat prescription: Secondary | ICD-10-CM

## 2022-10-04 DIAGNOSIS — I10 Essential (primary) hypertension: Secondary | ICD-10-CM

## 2022-10-06 ENCOUNTER — Telehealth: Payer: Self-pay

## 2022-10-06 NOTE — Telephone Encounter (Signed)
Called patient to let her know her Novolog is here. She will come Monday to pick it up.

## 2022-10-10 ENCOUNTER — Other Ambulatory Visit: Payer: Self-pay | Admitting: Nurse Practitioner

## 2022-10-26 DIAGNOSIS — Z872 Personal history of diseases of the skin and subcutaneous tissue: Secondary | ICD-10-CM | POA: Diagnosis not present

## 2022-10-26 DIAGNOSIS — L298 Other pruritus: Secondary | ICD-10-CM | POA: Diagnosis not present

## 2022-10-26 DIAGNOSIS — L578 Other skin changes due to chronic exposure to nonionizing radiation: Secondary | ICD-10-CM | POA: Diagnosis not present

## 2022-10-26 DIAGNOSIS — Z859 Personal history of malignant neoplasm, unspecified: Secondary | ICD-10-CM | POA: Diagnosis not present

## 2022-10-26 DIAGNOSIS — L720 Epidermal cyst: Secondary | ICD-10-CM | POA: Diagnosis not present

## 2022-12-20 ENCOUNTER — Other Ambulatory Visit: Payer: Self-pay | Admitting: Nurse Practitioner

## 2022-12-20 ENCOUNTER — Telehealth: Payer: Self-pay

## 2022-12-20 MED ORDER — CYCLOBENZAPRINE HCL 5 MG PO TABS: 5.0000 mg | ORAL_TABLET | Freq: Three times a day (TID) | ORAL | 0 refills | Status: DC | PRN

## 2022-12-20 NOTE — Telephone Encounter (Signed)
Pt called that she is having lower back last 4 days using walker and also she diarrhea several days its stop now pt try OTC ibuprofen its not helping  as per  alyssa advised that alyssa sent muscle relaxer and she can take OTC imodium for diarrhea if she have again also if she is not feeling need appt

## 2023-01-02 ENCOUNTER — Telehealth: Payer: Self-pay

## 2023-01-02 NOTE — Telephone Encounter (Signed)
Notified patient that her Novolog pens are ready for pick-up.

## 2023-01-09 ENCOUNTER — Other Ambulatory Visit: Payer: Self-pay

## 2023-01-09 DIAGNOSIS — R519 Headache, unspecified: Secondary | ICD-10-CM

## 2023-01-09 MED ORDER — IBUPROFEN 800 MG PO TABS: 800.0000 mg | ORAL_TABLET | Freq: Three times a day (TID) | ORAL | 1 refills | Status: DC | PRN

## 2023-02-22 ENCOUNTER — Other Ambulatory Visit: Payer: Self-pay | Admitting: Nurse Practitioner

## 2023-02-22 DIAGNOSIS — R519 Headache, unspecified: Secondary | ICD-10-CM

## 2023-02-28 ENCOUNTER — Other Ambulatory Visit: Payer: Self-pay | Admitting: Nurse Practitioner

## 2023-02-28 DIAGNOSIS — I700 Atherosclerosis of aorta: Secondary | ICD-10-CM

## 2023-03-28 ENCOUNTER — Encounter: Payer: Self-pay | Admitting: Nurse Practitioner

## 2023-03-28 ENCOUNTER — Ambulatory Visit (INDEPENDENT_AMBULATORY_CARE_PROVIDER_SITE_OTHER): Payer: Medicare HMO | Admitting: Nurse Practitioner

## 2023-03-28 VITALS — BP 136/80 | HR 90 | Temp 98.8°F | Resp 16 | Ht 61.5 in | Wt 161.6 lb

## 2023-03-28 DIAGNOSIS — E1159 Type 2 diabetes mellitus with other circulatory complications: Secondary | ICD-10-CM | POA: Diagnosis not present

## 2023-03-28 DIAGNOSIS — E1122 Type 2 diabetes mellitus with diabetic chronic kidney disease: Secondary | ICD-10-CM

## 2023-03-28 DIAGNOSIS — Z794 Long term (current) use of insulin: Secondary | ICD-10-CM

## 2023-03-28 DIAGNOSIS — E785 Hyperlipidemia, unspecified: Secondary | ICD-10-CM | POA: Diagnosis not present

## 2023-03-28 DIAGNOSIS — E1169 Type 2 diabetes mellitus with other specified complication: Secondary | ICD-10-CM | POA: Diagnosis not present

## 2023-03-28 DIAGNOSIS — N1832 Chronic kidney disease, stage 3b: Secondary | ICD-10-CM

## 2023-03-28 DIAGNOSIS — Z1211 Encounter for screening for malignant neoplasm of colon: Secondary | ICD-10-CM | POA: Diagnosis not present

## 2023-03-28 DIAGNOSIS — Z0000 Encounter for general adult medical examination without abnormal findings: Secondary | ICD-10-CM

## 2023-03-28 DIAGNOSIS — Z1212 Encounter for screening for malignant neoplasm of rectum: Secondary | ICD-10-CM | POA: Diagnosis not present

## 2023-03-28 DIAGNOSIS — I152 Hypertension secondary to endocrine disorders: Secondary | ICD-10-CM | POA: Diagnosis not present

## 2023-03-28 MED ORDER — TIRZEPATIDE 2.5 MG/0.5ML ~~LOC~~ SOAJ: 2.5000 mg | SUBCUTANEOUS | 3 refills | Status: DC

## 2023-03-28 NOTE — Progress Notes (Signed)
[facility] [address] [place], [place] [address]  Internal MEDICINE  Office Visit Note  Patient Name: Tiffany Hawkins  [id number]  [id number]  Date of Service: 03/28/2023  Chief Complaint  Patient presents with   Diabetes   Hypertension   Hyperlipidemia   Medicare Wellness    HPI Tiffany Hawkins presents for a medicare annual well visit. Well-appearing 77 y.o. female with hypertension, aortic atherosclerosis, allergic rhinitis, GERD, high cholesterol and type 2 diabetes.  Routine CRC screening: due now, need referral Routine mammogram: done in April 2024, due in April next year  DEXA scan: done in 2016 Eye exam: [facility] foot exam: done Labs: due for routine labs  New or worsening pain: none Did not tolerate ozempic due to side effects,  2 month old grandson passed away in 11-07-2022 Reports that her father died when she was 46 years old but she never saw a therapist Has been eating more lately.      03/28/2023   10:42 AM 03/08/2022   10:58 AM 03/04/2021   10:24 AM  MMSE - Mini Mental State Exam  Orientation to time 5 5 5   Orientation to Place 5 5 5   Registration 3 3 3   Attention/ Calculation 5 5 5   Recall 3 3 3   Language- name 2 objects 2 2 2   Language- repeat 1 1 1   Language- follow 3 step command 3 3 3   Language- read & follow direction 1 1 1   Write a sentence 1 1 1   Copy design 1 1 1   Total score 30 30 30     Functional Status Survey: Is the patient deaf or have difficulty hearing?: No Does the patient have difficulty seeing, even when wearing glasses/contacts?: No Does the patient have difficulty concentrating, remembering, or making decisions?: No Does the patient have difficulty walking or climbing stairs?: No Does the patient have difficulty dressing or bathing?: No Does the patient have difficulty doing errands alone such as visiting a doctor's office or shopping?: No     11/09/2021    1:25 PM 02/08/2022    1:25 PM 03/08/2022   10:56  AM 05/17/2022   10:45 AM 03/28/2023   10:40 AM  Fall Risk  Falls in the past year? 1 0 0 0 0  Was there an injury with Fall? 0 0 0 0 0  Fall Risk Category Calculator 2 0 0 0 0  Fall Risk Category (Retired) Moderate Low Low    (RETIRED) Patient Fall Risk Level Moderate fall risk Low fall risk Low fall risk    Patient at Risk for Falls Due to  No Fall Risks No Fall Risks No Fall Risks No Fall Risks  Fall risk Follow up Falls evaluation completed Falls evaluation completed Falls evaluation completed Falls evaluation completed Falls evaluation completed       03/28/2023   10:40 AM  Depression screen PHQ 2/9  Decreased Interest 0  Down, Depressed, Hopeless 0  PHQ - 2 Score 0       Current Medication: Outpatient Encounter Medications as of 03/28/2023  Medication Sig   albuterol (VENTOLIN HFA) 108 (90 Base) MCG/ACT inhaler Inhale 2 puffs into the lungs every 6 (six) hours as needed for wheezing or shortness of breath.   amLODipine (NORVASC) 5 MG tablet TAKE 2 TABLETS EVERY DAY   Blood Glucose Monitoring Suppl (TRUE METRIX METER) w/Device KIT USE AS DIRECTED   cetirizine (ZYRTEC) 10 MG chewable tablet Chew 1 tablet (10 mg total) by mouth daily.  citalopram (CELEXA) 40 MG tablet TAKE 1 TABLET EVERY DAY   cyclobenzaprine (FLEXERIL) 5 MG tablet Take 1 tablet (5 mg total) by mouth 3 (three) times daily as needed for muscle spasms.   dapagliflozin propanediol (FARXIGA) 10 MG TABS tablet Take 1 tablet (10 mg total) by mouth daily.   fenofibrate 54 MG tablet TAKE 1 TABLET EVERY DAY   fluticasone (FLONASE) 50 MCG/ACT nasal spray Place 2 sprays into both nostrils daily.   ibuprofen (ADVIL) 800 MG tablet Take 1 tablet (800 mg total) by mouth every 8 (eight) hours as needed.   insulin aspart protamine - aspart (NOVOLOG MIX 70/30 FLEXPEN) (70-30) 100 UNIT/ML FlexPen Inject 35 units [place] QAM and inject 40units [place] QPM   lovastatin (MEVACOR) 20 MG tablet TAKE 1 TABLET AT BEDTIME FOR HIGH CHOLESTEROL    Multiple Vitamin (MULTIVITAMIN) tablet Take by mouth daily. TAKES 1/2 TABLET   ondansetron (ZOFRAN) 4 MG tablet Take 1 tablet (4 mg total) by mouth every 8 (eight) hours as needed for nausea or vomiting.   pantoprazole (PROTONIX) 20 MG tablet TAKE 2 TABLETS EVERY DAY   sharps container 1 each by Does not apply route as needed. To use for insulin syringes and needles. Taking insulin at least twice daily.  E11.65   spironolactone (ALDACTONE) 100 MG tablet Take 100 mg by mouth daily. 2 tablets once a day   TRUE METRIX BLOOD GLUCOSE TEST test strip TEST BLOOD SUGAR THREE TIMES DAILY AFTER MEALS   TRUEplus Lancets 33G MISC USE AS DIRECTED TO TEST BLOOD SUGAR THREE TIMES DAILY   [DISCONTINUED] Continuous Blood Gluc Sensor (DEXCOM G7 SENSOR) MISC Apply 1 sensor every 10 days to check glucose levels   [DISCONTINUED] glipiZIDE (GLUCOTROL XL) 2.5 MG 24 hr tablet Take 1 tablet (2.5 mg total) by mouth daily with breakfast.   [DISCONTINUED] tirzepatide (MOUNJARO) 2.5 MG/0.5ML Pen Inject 2.5 mg into the skin once a week.   tirzepatide [facility]) 2.5 MG/0.5ML Pen Inject 2.5 mg into the skin once a week.   No facility-administered encounter medications on file as of 03/28/2023.    Surgical History: Past Surgical History:  Procedure Laterality Date   APPENDECTOMY     BACK SURGERY     CATARACT EXTRACTION W/PHACO Left 12/03/2017   Procedure: CATARACT EXTRACTION PHACO AND INTRAOCULAR LENS PLACEMENT (IOC) LEFT  DIABETIC;  Surgeon: Nevada Crane, MD;  Location: [facility];  Service: Ophthalmology;  Laterality: Left;  Diabetic - insulin   CATARACT EXTRACTION W/PHACO Right 01/14/2018   Procedure: CATARACT EXTRACTION PHACO AND INTRAOCULAR LENS PLACEMENT (IOC) RIGHT DIABETIC;  Surgeon: Nevada Crane, MD;  Location: [facility];  Service: Ophthalmology;  Laterality: Right;  Diabetic - insulin   CERVICAL DISC SURGERY     CHOLECYSTECTOMY     COLON SURGERY     COLONOSCOPY     COLONOSCOPY WITH  PROPOFOL N/A 05/17/2015   Procedure: COLONOSCOPY WITH PROPOFOL;  Surgeon: Scot Jun, MD;  Location: [facility];  Service: Endoscopy;  Laterality: N/A;   COLONOSCOPY WITH PROPOFOL N/A 04/01/2018   Procedure: COLONOSCOPY WITH BIOPSY;  Surgeon: Midge Minium, MD;  Location: [facility];  Service: Endoscopy;  Laterality: N/A;  Diabetic - insulin   EMBOLIZATION (CATH LAB) N/A 01/24/2021   Procedure: EMBOLIZATION;  Surgeon: Annice Needy, MD;  Location: [facility] INVASIVE CV LAB;  Service: Cardiovascular;  Laterality: N/A;   ESOPHAGOGASTRODUODENOSCOPY     HERNIA REPAIR     LOWER EXTREMITY VENOGRAPHY Left 01/24/2021   Procedure: LOWER EXTREMITY  VENOGRAPHY;  Surgeon: Annice Needy, MD;  Location: [facility] INVASIVE CV LAB;  Service: Cardiovascular;  Laterality: Left;   POLYPECTOMY N/A 04/01/2018   Procedure: POLYPECTOMY;  Surgeon: Midge Minium, MD;  Location: [facility];  Service: Endoscopy;  Laterality: N/A;   PORT A CATH INJECTION ([facility] HX)     Port has been removed   TUBAL LIGATION      Medical History: Past Medical History:  Diagnosis Date   Anxiety    Colon cancer (HCC) 10/12/2014   Stage IIB; had chemo   Diabetes mellitus without complication (HCC)    Hyperlipidemia    Hypertension    Lateral epicondylitis    Personal history of chemotherapy    Polio    childhood   Skin cancer    Sqamous Cell, Lt Calf   Sleep apnea    no CPAP, sx improved with wt loss    Family History: Family History  Problem Relation Age of Onset   Alzheimer's disease Mother    Heart disease Father    Colon cancer Cousin    Colon cancer Cousin    Cancer Cousin        Brain Tumor   Breast cancer Neg Hx     Social History   Socioeconomic History   Marital status: Married    Spouse name: Not on file   Number of children: Not on file   Years of education: Not on file   Highest education level: Not on file  Occupational History   Not on file  Tobacco Use   Smoking status: Former     Current packs/day: 0.00    Average packs/day: 1.5 packs/day for 20.0 years (30.0 ttl pk-yrs)    Types: Cigarettes    Start date: 04/17/1961    Quit date: 04/17/1981    Years since quitting: 41.9   Smokeless tobacco: Never  Vaping Use   Vaping status: Never Used  Substance and Sexual Activity   Alcohol use: No   Drug use: No   Sexual activity: Yes  Other Topics Concern   Not on file  Social History Narrative   Not on file   Social Determinants of Health   Financial Resource Strain: Low Risk  (06/22/2021)   Overall Financial Resource Strain (CARDIA)    Difficulty of Paying Living Expenses: Not hard at all  Food Insecurity: Not on file  Transportation Needs: No Transportation Needs (06/22/2021)   PRAPARE - Administrator, Civil Service (Medical): No    Lack of Transportation (Non-Medical): No  Physical Activity: Not on file  Stress: Not on file  Social Connections: Not on file  Intimate Partner Violence: Not on file      Review of Systems  Constitutional:  Positive for appetite change, fatigue and unexpected weight change. Negative for activity change, chills and fever.  HENT: Negative.  Negative for congestion, ear pain, rhinorrhea, sore throat and trouble swallowing.   Eyes: Negative.   Respiratory: Negative.  Negative for cough, chest tightness, shortness of breath and wheezing.   Cardiovascular: Negative.  Negative for chest pain.  Gastrointestinal: Negative.  Negative for abdominal pain, blood in stool, constipation, diarrhea, nausea and vomiting.  Endocrine: Positive for polydipsia and polyphagia.  Genitourinary: Negative.  Negative for difficulty urinating, dysuria, frequency, hematuria and urgency.  Musculoskeletal: Negative.  Negative for arthralgias, back pain, joint swelling, myalgias and neck pain.  Skin: Negative.  Negative for rash and wound.  Allergic/Immunologic: Negative.  Negative for immunocompromised state.  Neurological:  Negative.  Negative for  dizziness, seizures, numbness and headaches.  Hematological: Negative.   Psychiatric/Behavioral: Negative.  Negative for behavioral problems, self-injury and suicidal ideas. The patient is not nervous/anxious.     Vital Signs: BP 136/80   Pulse 90   Temp 98.8 F (37.1 C)   Resp 16   Ht 5' 1.5" (1.562 m)   Wt 161 lb 9.6 oz (73.3 kg)   SpO2 96%   BMI 30.04 kg/m    Physical Exam Vitals reviewed.  Constitutional:      General: She is awake. She is not in acute distress.    Appearance: Normal appearance. She is well-developed and well-groomed. She is obese. She is not ill-appearing or diaphoretic.  HENT:     Head: Normocephalic and atraumatic.     Right Ear: Tympanic membrane, ear canal and external ear normal.     Left Ear: Tympanic membrane, ear canal and external ear normal.     Nose: Nose normal. No congestion or rhinorrhea.     Mouth/Throat:     Lips: Pink.     Mouth: Mucous membranes are moist.     Pharynx: Oropharynx is clear. Uvula midline. No oropharyngeal exudate or posterior oropharyngeal erythema.  Eyes:     General: Lids are normal. Vision grossly intact. Gaze aligned appropriately. No scleral icterus.       Right eye: No discharge.        Left eye: No discharge.     Extraocular Movements: Extraocular movements intact.     Conjunctiva/sclera: Conjunctivae normal.     Pupils: Pupils are equal, round, and reactive to light.     Funduscopic exam:    Right eye: Red reflex present.        Left eye: Red reflex present. Neck:     Thyroid: No thyromegaly.     Vascular: No carotid bruit or JVD.     Trachea: Trachea and phonation normal. No tracheal deviation.  Cardiovascular:     Rate and Rhythm: Normal rate and regular rhythm.     Pulses: Normal pulses.          Dorsalis pedis pulses are 2+ on the right side and 2+ on the left side.       Posterior tibial pulses are 2+ on the right side and 2+ on the left side.     Heart sounds: Normal heart sounds, S1 normal and S2  normal. No murmur heard.    No friction rub. No gallop.  Pulmonary:     Effort: Pulmonary effort is normal. No accessory muscle usage or respiratory distress.     Breath sounds: Normal breath sounds and air entry. No stridor. No wheezing or rales.  Chest:     Chest wall: No tenderness.     Comments: Declined clinical breast exam Abdominal:     General: Bowel sounds are normal. There is no distension.     Palpations: Abdomen is soft. There is no shifting dullness, fluid wave, mass or pulsatile mass.     Tenderness: There is no abdominal tenderness. There is no guarding or rebound.  Musculoskeletal:        General: No tenderness or deformity. Normal range of motion.     Cervical back: Normal range of motion and neck supple.     Right foot: Prominent metatarsal heads present.     Left foot: Prominent metatarsal heads present.  Feet:     Right foot:     Protective Sensation: 6 sites tested.  5  sites sensed.     Skin integrity: Callus present.     Toenail Condition: Right toenails are abnormally thick, long and ingrown.     Left foot:     Protective Sensation: 6 sites tested.  5 sites sensed.     Skin integrity: Callus present.     Toenail Condition: Left toenails are abnormally thick, long and ingrown.  Lymphadenopathy:     Cervical: No cervical adenopathy.  Skin:    General: Skin is warm and dry.     Capillary Refill: Capillary refill takes less than 2 seconds.     Coloration: Skin is not pale.     Findings: No erythema or rash.  Neurological:     Mental Status: She is alert and oriented to person, place, and time.     Cranial Nerves: No cranial nerve deficit.     Motor: No abnormal muscle tone.     Coordination: Coordination normal.     Gait: Gait normal.     Deep Tendon Reflexes: Reflexes are normal and symmetric.  Psychiatric:        Mood and Affect: Mood and affect normal.        Behavior: Behavior normal. Behavior is cooperative.        Thought Content: Thought content  normal.        Judgment: Judgment normal.        Assessment/Plan: 1. Encounter for subsequent annual wellness visit (AWV) in Medicare patient Age-appropriate preventive screenings and vaccinations discussed, annual physical exam completed. Routine labs for health maintenance will be ordered, requisition form given to patient. PHM updated.    2. Type 2 diabetes mellitus with stage 3b chronic kidney disease, with long-term current use of insulin (HCC) Will try mounjaro again. Patient tolerated 2.5 mg dose.  - tirzepatide [facility]) 2.5 MG/0.5ML Pen; Inject 2.5 mg into the skin once a week.  Dispense: 2 mL; Refill: 3  3. Stage 3b chronic kidney disease (HCC) Continue diet modifications as previously discussed. Already on farxiga, continue as prescribed.   4. Hypertension associated with type 2 diabetes mellitus (HCC) Continue amlodipine and spironolactone as prescribed.   5. Hyperlipidemia associated with type 2 diabetes mellitus (HCC) Continue fenofibrate and lovastatin as prescribed.   6. Screening for colorectal cancer Referred to GI - Ambulatory referral to Gastroenterology     General Counseling: rosezetta sammut understanding of the findings of todays visit and agrees with plan of treatment. I have discussed any further diagnostic evaluation that may be needed or ordered today. We also reviewed her medications today. she has been encouraged to call the office with any questions or concerns that should arise related to todays visit.    Orders Placed This Encounter  Procedures   Ambulatory referral to Gastroenterology    Meds ordered this encounter  Medications   tirzepatide [facility]) 2.5 MG/0.5ML Pen    Sig: Inject 2.5 mg into the skin once a week.    Dispense:  2 mL    Refill:  3    Dx code E11.65    Return in about 4 months (around 07/27/2023) for F/U, Recheck A1C, Eliyah Mcshea PCP.   Total time spent:30 Minutes Time spent includes review of chart, medications,  test results, and follow up plan with the patient.   [place] Controlled Substance Database was reviewed by me.  This patient was seen by Sallyanne Kuster, FNP-C in collaboration with Dr. Beverely Risen as a part of collaborative care agreement.  Marlyn Rabine R. Tedd Sias, MSN, FNP-C Internal medicine

## 2023-04-02 ENCOUNTER — Ambulatory Visit (INDEPENDENT_AMBULATORY_CARE_PROVIDER_SITE_OTHER): Payer: Medicare HMO | Admitting: Physician Assistant

## 2023-04-02 ENCOUNTER — Encounter: Payer: Self-pay | Admitting: Physician Assistant

## 2023-04-02 VITALS — BP 120/65 | HR 82 | Temp 98.5°F | Resp 16 | Ht 61.5 in | Wt 161.4 lb

## 2023-04-02 DIAGNOSIS — G4733 Obstructive sleep apnea (adult) (pediatric): Secondary | ICD-10-CM

## 2023-04-02 DIAGNOSIS — R0602 Shortness of breath: Secondary | ICD-10-CM | POA: Diagnosis not present

## 2023-04-02 DIAGNOSIS — Z7189 Other specified counseling: Secondary | ICD-10-CM

## 2023-04-02 NOTE — Progress Notes (Signed)
[facility] [address] [place], [place] [address]  Pulmonary Sleep Medicine   Office Visit Note  Patient Name: Tiffany Hawkins DOB: 1945-04-23 MRN [id number]  Date of Service: 05/01/2023  Complaints/HPI: Pt is here for routine pulmonary follow up. Does have some SOB with exertion, but also just more fatigued recently. Will update walk. Also due for PFT prior to next visit. Wearing cpap nightly and is benefiting from use. Changing supplies regularly. Uses [employer]. Does need new supplies now.  CPAP compliance 03/03/23-04/01/23 Usage 30/30 days >4 hours 93% APAP 10-17 cm H2O AHI 1.8   ROS  General: (-) fever, (-) chills, (-) night sweats, (-) weakness Skin: (-) rashes, (-) itching,. Eyes: (-) visual changes, (-) redness, (-) itching. Nose and Sinuses: (-) nasal stuffiness or itchiness, (-) postnasal drip, (-) nosebleeds, (-) sinus trouble. Mouth and Throat: (-) sore throat, (-) hoarseness. Neck: (-) swollen glands, (-) enlarged thyroid, (-) neck pain. Respiratory: - cough, (-) bloody sputum, + shortness of breath, - wheezing. Cardiovascular: - ankle swelling, (-) chest pain. Lymphatic: (-) lymph node enlargement. Neurologic: (-) numbness, (-) tingling. Psychiatric: (-) anxiety, (-) depression   Current Medication: Outpatient Encounter Medications as of 04/02/2023  Medication Sig   albuterol (VENTOLIN HFA) 108 (90 Base) MCG/ACT inhaler Inhale 2 puffs into the lungs every 6 (six) hours as needed for wheezing or shortness of breath.   amLODipine (NORVASC) 5 MG tablet TAKE 2 TABLETS EVERY DAY   Blood Glucose Monitoring Suppl (TRUE METRIX METER) w/Device KIT USE AS DIRECTED   cetirizine (ZYRTEC) 10 MG chewable tablet Chew 1 tablet (10 mg total) by mouth daily.   cyclobenzaprine (FLEXERIL) 5 MG tablet Take 1 tablet (5 mg total) by mouth 3 (three) times daily as needed for muscle spasms.   dapagliflozin propanediol (FARXIGA) 10 MG TABS tablet Take 1 tablet  (10 mg total) by mouth daily.   fenofibrate 54 MG tablet TAKE 1 TABLET EVERY DAY   fluticasone (FLONASE) 50 MCG/ACT nasal spray Place 2 sprays into both nostrils daily.   ibuprofen (ADVIL) 800 MG tablet Take 1 tablet (800 mg total) by mouth every 8 (eight) hours as needed.   insulin aspart protamine - aspart (NOVOLOG MIX 70/30 FLEXPEN) (70-30) 100 UNIT/ML FlexPen Inject 35 units [place] QAM and inject 40units [place] QPM   Multiple Vitamin (MULTIVITAMIN) tablet Take by mouth daily. TAKES 1/2 TABLET   ondansetron (ZOFRAN) 4 MG tablet Take 1 tablet (4 mg total) by mouth every 8 (eight) hours as needed for nausea or vomiting.   pantoprazole (PROTONIX) 20 MG tablet TAKE 2 TABLETS EVERY DAY   sharps container 1 each by Does not apply route as needed. To use for insulin syringes and needles. Taking insulin at least twice daily.  E11.65   spironolactone (ALDACTONE) 100 MG tablet Take 100 mg by mouth daily. 2 tablets once a day   tirzepatide [facility]) 2.5 MG/0.5ML Pen Inject 2.5 mg into the skin once a week.   TRUE METRIX BLOOD GLUCOSE TEST test strip TEST BLOOD SUGAR THREE TIMES DAILY AFTER MEALS   TRUEplus Lancets 33G MISC USE AS DIRECTED TO TEST BLOOD SUGAR THREE TIMES DAILY   [DISCONTINUED] citalopram (CELEXA) 40 MG tablet TAKE 1 TABLET EVERY DAY   [DISCONTINUED] lovastatin (MEVACOR) 20 MG tablet TAKE 1 TABLET AT BEDTIME FOR HIGH CHOLESTEROL   No facility-administered encounter medications on file as of 04/02/2023.    Surgical History: Past Surgical History:  Procedure Laterality Date   APPENDECTOMY  BACK SURGERY     CATARACT EXTRACTION W/PHACO Left 12/03/2017   Procedure: CATARACT EXTRACTION PHACO AND INTRAOCULAR LENS PLACEMENT (IOC) LEFT  DIABETIC;  Surgeon: Nevada Crane, MD;  Location: [facility];  Service: Ophthalmology;  Laterality: Left;  Diabetic - insulin   CATARACT EXTRACTION W/PHACO Right 01/14/2018   Procedure: CATARACT EXTRACTION PHACO AND INTRAOCULAR LENS PLACEMENT (IOC)  RIGHT DIABETIC;  Surgeon: Nevada Crane, MD;  Location: [facility];  Service: Ophthalmology;  Laterality: Right;  Diabetic - insulin   CERVICAL DISC SURGERY     CHOLECYSTECTOMY     COLON SURGERY     COLONOSCOPY     COLONOSCOPY WITH PROPOFOL N/A 05/17/2015   Procedure: COLONOSCOPY WITH PROPOFOL;  Surgeon: Scot Jun, MD;  Location: [facility] END[facility]COPY;  Service: Endoscopy;  Laterality: N/A;   COLONOSCOPY WITH PROPOFOL N/A 04/01/2018   Procedure: COLONOSCOPY WITH BIOPSY;  Surgeon: Midge Minium, MD;  Location: [facility];  Service: Endoscopy;  Laterality: N/A;  Diabetic - insulin   EMBOLIZATION (CATH LAB) N/A 01/24/2021   Procedure: EMBOLIZATION;  Surgeon: Annice Needy, MD;  Location: [facility] INVASIVE CV LAB;  Service: Cardiovascular;  Laterality: N/A;   ESOPHAGOGASTRODUODENOSCOPY     HERNIA REPAIR     LOWER EXTREMITY VENOGRAPHY Left 01/24/2021   Procedure: LOWER EXTREMITY VENOGRAPHY;  Surgeon: Annice Needy, MD;  Location: [facility] INVASIVE CV LAB;  Service: Cardiovascular;  Laterality: Left;   POLYPECTOMY N/A 04/01/2018   Procedure: POLYPECTOMY;  Surgeon: Midge Minium, MD;  Location: [facility];  Service: Endoscopy;  Laterality: N/A;   PORT A CATH INJECTION ([facility] HX)     Port has been removed   TUBAL LIGATION      Medical History: Past Medical History:  Diagnosis Date   Anxiety    Colon cancer (HCC) 10/12/2014   Stage IIB; had chemo   Diabetes mellitus without complication (HCC)    Hyperlipidemia    Hypertension    Lateral epicondylitis    Personal history of chemotherapy    Polio    childhood   Skin cancer    Sqamous Cell, Lt Calf   Sleep apnea    no CPAP, sx improved with wt loss    Family History: Family History  Problem Relation Age of Onset   Alzheimer's disease Mother    Heart disease Father    Colon cancer Cousin    Colon cancer Cousin    Cancer Cousin        Brain Tumor   Breast cancer Neg Hx     Social History: Social History    Socioeconomic History   Marital status: Married    Spouse name: Not on file   Number of children: Not on file   Years of education: Not on file   Highest education level: Not on file  Occupational History   Not on file  Tobacco Use   Smoking status: Former    Current packs/day: 0.00    Average packs/day: 1.5 packs/day for 20.0 years (30.0 ttl pk-yrs)    Types: Cigarettes    Start date: 04/17/1961    Quit date: 04/17/1981    Years since quitting: 42.0   Smokeless tobacco: Never  Vaping Use   Vaping status: Never Used  Substance and Sexual Activity   Alcohol use: No   Drug use: No   Sexual activity: Yes  Other Topics Concern   Not on file  Social History Narrative   Not on file   Social Drivers of  Health   Financial Resource Strain: Low Risk  (06/22/2021)   Overall Financial Resource Strain (CARDIA)    Difficulty of Paying Living Expenses: Not hard at all  Food Insecurity: Not on file  Transportation Needs: No Transportation Needs (06/22/2021)   PRAPARE - Administrator, Civil Service (Medical): No    Lack of Transportation (Non-Medical): No  Physical Activity: Not on file  Stress: Not on file  Social Connections: Not on file  Intimate Partner Violence: Not on file    Vital Signs: Blood pressure 120/65, pulse 82, temperature 98.5 F (36.9 C), resp. rate 16, height 5' 1.5" (1.562 m), weight 161 lb 6.4 oz (73.2 kg), SpO2 94%.  Examination: General Appearance: The patient is well-developed, well-nourished, and in no distress. Skin: Gross inspection of skin unremarkable. Head: normocephalic, no gross deformities. Eyes: no gross deformities noted. ENT: ears appear grossly normal no exudates. Neck: Supple. No thyromegaly. No LAD. Respiratory: No rhonchi. Cardiovascular: Normal S1 and S2 without murmur or rub. Extremities: No cyanosis. pulses are equal. Neurologic: Alert and oriented. No involuntary movements.  LABS: Recent Results (from the past 2160 hours)   CBC with Differential/Platelet     Status: Abnormal   Collection Time: 04/19/23  9:15 AM  Result Value Ref Range   WBC 8.4 3.4 - 10.8 x10E3/uL   RBC 5.38 (H) 3.77 - 5.28 x10E6/uL   Hemoglobin 15.6 11.1 - 15.9 g/dL   Hematocrit 16.1 (H) 09.6 - 46.6 %   MCV 92 79 - 97 fL   MCH 29.0 26.6 - 33.0 pg   MCHC 31.5 31.5 - 35.7 g/dL   RDW 04.5 40.9 - 81.1 %   Platelets 214 150 - 450 x10E3/uL   Neutrophils 62 Not Estab. %   Lymphs 22 Not Estab. %   Monocytes 10 Not Estab. %   Eos 4 Not Estab. %   Basos 1 Not Estab. %   Neutrophils Absolute 5.3 1.4 - 7.0 x10E3/uL   Lymphocytes Absolute 1.8 0.7 - 3.1 x10E3/uL   Monocytes Absolute 0.8 0.1 - 0.9 x10E3/uL   EOS (ABSOLUTE) 0.3 0.0 - 0.4 x10E3/uL   Basophils Absolute 0.0 0.0 - 0.2 x10E3/uL   Immature Granulocytes 1 Not Estab. %   Immature Grans (Abs) 0.1 0.0 - 0.1 x10E3/uL  Comprehensive metabolic panel     Status: Abnormal   Collection Time: 04/19/23  9:15 AM  Result Value Ref Range   Glucose 148 (H) 70 - 99 mg/dL   BUN 25 8 - 27 mg/dL   Creatinine, Ser 9.14 (H) 0.57 - 1.00 mg/dL   eGFR 43 (L) >78 GN/FAO/1.30   BUN/Creatinine Ratio 20 12 - 28   Sodium 142 134 - 144 mmol/L   Potassium 4.1 3.5 - 5.2 mmol/L   Chloride 102 96 - 106 mmol/L   CO2 24 20 - 29 mmol/L   Calcium 9.6 8.7 - 10.3 mg/dL   Total Protein 7.1 6.0 - 8.5 g/dL   Albumin 4.6 3.8 - 4.8 g/dL   Globulin, Total 2.5 1.5 - 4.5 g/dL   Bilirubin Total 0.3 0.0 - 1.2 mg/dL   Alkaline Phosphatase 78 44 - 121 IU/L   AST 25 0 - 40 IU/L   ALT 34 (H) 0 - 32 IU/L  Lipid panel     Status: Abnormal   Collection Time: 04/19/23  9:15 AM  Result Value Ref Range   Cholesterol, Total 164 100 - 199 mg/dL   Triglycerides 865 (H) 0 - 149 mg/dL   HDL  34 (L) >39 mg/dL   VLDL Cholesterol Cal 45 (H) 5 - 40 mg/dL   LDL Chol Calc (NIH) 85 0 - 99 mg/dL   Chol/HDL Ratio 4.8 (H) 0.0 - 4.4 ratio    Comment:                                   T. Chol/HDL Ratio                                              Men  Women                               1/2 Avg.Risk  3.4    3.3                                   Avg.Risk  5.0    4.4                                2X Avg.Risk  9.6    7.1                                3X Avg.Risk 23.4   11.0   Hgb A1c w/o eAG     Status: Abnormal   Collection Time: 04/19/23  9:15 AM  Result Value Ref Range   Hgb A1c MFr Bld 7.6 (H) 4.8 - 5.6 %    Comment:          Prediabetes: 5.7 - 6.4          Diabetes: >6.4          Glycemic control for adults with diabetes: <7.0   VITAMIN D 25 Hydroxy (Vit-D Deficiency, Fractures)     Status: Abnormal   Collection Time: 04/19/23  9:15 AM  Result Value Ref Range   Vit D, 25-Hydroxy 29.1 (L) 30.0 - 100.0 ng/mL    Comment: Vitamin D deficiency has been defined by the Institute of Medicine and an Endocrine Society practice guideline as a level of serum 25-OH vitamin D less than 20 ng/mL (1,2). The Endocrine Society went on to further define vitamin D insufficiency as a level between 21 and 29 ng/mL (2). 1. IOM (Institute of Medicine). 2010. Dietary reference    intakes for calcium and D. [place]: The    [employer]. 2. Holick MF, Binkley [place], Bischoff-Ferrari HA, et al.    Evaluation, treatment, and prevention of vitamin D    deficiency: an Endocrine Society clinical practice    guideline. JCEM. 2011 Jul; 96(7):1911-30.     Radiology: MM 3D SCREEN BREAST BILATERAL Result Date: 08/01/2022 CLINICAL DATA:  Screening. EXAM: DIGITAL SCREENING BILATERAL MAMMOGRAM WITH TOMOSYNTHESIS AND CAD TECHNIQUE: Bilateral screening digital craniocaudal and mediolateral oblique mammograms were obtained. Bilateral screening digital breast tomosynthesis was performed. The images were evaluated with computer-aided detection. COMPARISON:  Previous exam(s). ACR Breast Density Category c: The breasts are heterogeneously dense, which may obscure small masses. FINDINGS: There are no findings suspicious for malignancy. IMPRESSION: No  mammographic evidence of malignancy.  A result letter of this screening mammogram will be mailed directly to the patient. RECOMMENDATION: Screening mammogram in one year. (Code:SM-B-01Y) BI-RADS CATEGORY  1: Negative. Electronically Signed   By: Annia Belt M.D.   On: 08/01/2022 12:45    No results found.  No results found.    Assessment and Plan: Patient Active Problem List   Diagnosis Date Noted   Aortic atherosclerosis (HCC) 12/29/2020   Deformity of toe of left foot 03/28/2020   H/O multiple pulmonary nodules 09/22/2019   Chronic cough 09/22/2019   Perennial allergic rhinitis 09/17/2019   Acute upper respiratory infection 08/28/2019   Weakness 08/28/2019   Acute bronchitis 06/01/2019   Cough productive of yellow sputum 06/01/2019   Vasomotor rhinitis 05/01/2019   Chronic kidney disease, stage II (mild) 03/14/2019   Gastroesophageal reflux disease without esophagitis 03/14/2019   Intractable episodic headache 03/14/2019   Need for prophylactic vaccination with combined diphtheria-tetanus-pertussis (DTaP) vaccine 03/14/2019   Renal cyst 11/27/2018   Abnormality of gait and mobility 11/27/2018   Hepatic steatosis 10/31/2018   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/31/2018   Elevated ferritin 10/31/2018   Vertigo 08/13/2018   Gastroenteritis 05/09/2018   Diarrhea 05/09/2018   Nausea 05/09/2018   Personal history of colon cancer    Polyp of sigmoid colon    Dysuria 02/23/2018   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 02/10/2018   Encounter for general adult medical examination with abnormal findings 02/10/2018   Acute non-recurrent pansinusitis 08/24/2017   Other headache syndrome 08/24/2017   Uncontrolled type 2 diabetes mellitus with hyperglycemia (HCC) 08/24/2017   Type 2 diabetes mellitus without complication, with long-term current use of insulin (HCC) 08/22/2017   Need for vaccination against Streptococcus pneumoniae using pneumococcal conjugate vaccine 13  08/22/2017   Essential hypertension 05/03/2017   Otalgia, bilateral 05/02/2017   Type 2 diabetes mellitus with hyperglycemia (HCC) 05/02/2017   Mixed hyperlipidemia 05/02/2017   Right lower quadrant pain 06/29/2016   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 Obstructive sleep apnea (Primary) Continue cpap nightly  2. CPAP use counseling CPAP couseling-Discussed importance of adequate CPAP use as well as proper care and cleaning techniques of machine and all supplies.  3. SOB (shortness of breath) SOB with exertion, overall more fatigued. 6 min walk did not show need for oxygen. Will update PFT, may need further follow up with PCP. - 6 minute walk - Pulmonary Function Test; Future   General Counseling: I have discussed the findings of the evaluation and examination with Jasmine December.  I have also discussed any further diagnostic evaluation thatmay be needed or ordered today. Hager verbalizes understanding of the findings of todays visit. We also reviewed her medications today and discussed drug interactions and side effects including but not limited excessive drowsiness and altered mental states. We also discussed that there is always a risk not just to her but also people around her. she has been encouraged to call the office with any questions or concerns that should arise related to todays visit.  Orders Placed This Encounter  Procedures   For home use only DME continuous positive airway pressure (CPAP)    Pt on CPAP 10-17, in need of supplies, ADAPT    Length of Need:   Lifetime    Patient has OSA or probable OSA:   Yes    Is the patient currently  using CPAP in the home:   Yes    Settings:   Other see comments    CPAP supplies needed:   Mask, headgear, cushions, filters, heated tubing and water chamber    Additional equipment included:   Heated humification and  supplies   6 minute walk   Pulmonary Function Test    Standing Status:   Future    Expiration Date:   04/01/2024    Where should this test be performed?:   [facility]    Full PFT: includes the following: basic spirometry, spirometry pre & post bronchodilator, diffusion capacity (DLCO), lung volumes:   Full PFT     Time spent: 30  I have personally obtained a history, examined the patient, evaluated laboratory and imaging results, formulated the assessment and plan and placed orders. This patient was seen by Lynn Ito, PA-C in collaboration with Dr. Freda Munro as a part of collaborative care agreement.     Yevonne Pax, MD [facility] Pulmonary and Critical Care Sleep medicine

## 2023-04-09 ENCOUNTER — Other Ambulatory Visit: Payer: Self-pay | Admitting: Nurse Practitioner

## 2023-04-19 ENCOUNTER — Other Ambulatory Visit: Payer: Self-pay | Admitting: Nurse Practitioner

## 2023-04-19 DIAGNOSIS — E559 Vitamin D deficiency, unspecified: Secondary | ICD-10-CM | POA: Diagnosis not present

## 2023-04-19 DIAGNOSIS — E1165 Type 2 diabetes mellitus with hyperglycemia: Secondary | ICD-10-CM | POA: Diagnosis not present

## 2023-04-19 DIAGNOSIS — N1832 Chronic kidney disease, stage 3b: Secondary | ICD-10-CM | POA: Diagnosis not present

## 2023-04-19 DIAGNOSIS — E782 Mixed hyperlipidemia: Secondary | ICD-10-CM | POA: Diagnosis not present

## 2023-04-20 LAB — CBC WITH DIFFERENTIAL/PLATELET
Basophils Absolute: 0 10*3/uL (ref 0.0–0.2)
Basos: 1 %
EOS (ABSOLUTE): 0.3 10*3/uL (ref 0.0–0.4)
Eos: 4 %
Hematocrit: 49.5 % — ABNORMAL HIGH (ref 34.0–46.6)
Hemoglobin: 15.6 g/dL (ref 11.1–15.9)
Immature Grans (Abs): 0.1 10*3/uL (ref 0.0–0.1)
Immature Granulocytes: 1 %
Lymphocytes Absolute: 1.8 10*3/uL (ref 0.7–3.1)
Lymphs: 22 %
MCH: 29 pg (ref 26.6–33.0)
MCHC: 31.5 g/dL (ref 31.5–35.7)
MCV: 92 fL (ref 79–97)
Monocytes Absolute: 0.8 10*3/uL (ref 0.1–0.9)
Monocytes: 10 %
Neutrophils Absolute: 5.3 10*3/uL (ref 1.4–7.0)
Neutrophils: 62 %
Platelets: 214 10*3/uL (ref 150–450)
RBC: 5.38 x10E6/uL — ABNORMAL HIGH (ref 3.77–5.28)
RDW: 12.9 % (ref 11.7–15.4)
WBC: 8.4 10*3/uL (ref 3.4–10.8)

## 2023-04-20 LAB — COMPREHENSIVE METABOLIC PANEL
ALT: 34 [IU]/L — ABNORMAL HIGH (ref 0–32)
AST: 25 [IU]/L (ref 0–40)
Albumin: 4.6 g/dL (ref 3.8–4.8)
Alkaline Phosphatase: 78 [IU]/L (ref 44–121)
BUN/Creatinine Ratio: 20 (ref 12–28)
BUN: 25 mg/dL (ref 8–27)
Bilirubin Total: 0.3 mg/dL (ref 0.0–1.2)
CO2: 24 mmol/L (ref 20–29)
Calcium: 9.6 mg/dL (ref 8.7–10.3)
Chloride: 102 mmol/L (ref 96–106)
Creatinine, Ser: 1.28 mg/dL — ABNORMAL HIGH (ref 0.57–1.00)
Globulin, Total: 2.5 g/dL (ref 1.5–4.5)
Glucose: 148 mg/dL — ABNORMAL HIGH (ref 70–99)
Potassium: 4.1 mmol/L (ref 3.5–5.2)
Sodium: 142 mmol/L (ref 134–144)
Total Protein: 7.1 g/dL (ref 6.0–8.5)
eGFR: 43 mL/min/{1.73_m2} — ABNORMAL LOW (ref >59)

## 2023-04-20 LAB — LIPID PANEL
Chol/HDL Ratio: 4.8 {ratio} — ABNORMAL HIGH (ref 0.0–4.4)
Cholesterol, Total: 164 mg/dL (ref 100–199)
HDL: 34 mg/dL — ABNORMAL LOW (ref >39)
LDL Chol Calc (NIH): 85 mg/dL (ref 0–99)
Triglycerides: 268 mg/dL — ABNORMAL HIGH (ref 0–149)
VLDL Cholesterol Cal: 45 mg/dL — ABNORMAL HIGH (ref 5–40)

## 2023-04-20 LAB — VITAMIN D 25 HYDROXY (VIT D DEFICIENCY, FRACTURES): Vit D, 25-Hydroxy: 29.1 ng/mL — ABNORMAL LOW (ref 30.0–100.0)

## 2023-04-20 LAB — HGB A1C W/O EAG: Hgb A1c MFr Bld: 7.6 % — ABNORMAL HIGH (ref 4.8–5.6)

## 2023-04-23 ENCOUNTER — Other Ambulatory Visit: Payer: Self-pay | Admitting: Nurse Practitioner

## 2023-05-16 ENCOUNTER — Other Ambulatory Visit: Payer: Self-pay | Admitting: Nurse Practitioner

## 2023-05-18 DIAGNOSIS — H04123 Dry eye syndrome of bilateral lacrimal glands: Secondary | ICD-10-CM | POA: Diagnosis not present

## 2023-05-18 DIAGNOSIS — Z0100 Encounter for examination of eyes and vision without abnormal findings: Secondary | ICD-10-CM | POA: Diagnosis not present

## 2023-05-18 DIAGNOSIS — H26492 Other secondary cataract, left eye: Secondary | ICD-10-CM | POA: Diagnosis not present

## 2023-05-18 DIAGNOSIS — E113293 Type 2 diabetes mellitus with mild nonproliferative diabetic retinopathy without macular edema, bilateral: Secondary | ICD-10-CM | POA: Diagnosis not present

## 2023-05-22 ENCOUNTER — Other Ambulatory Visit: Payer: Self-pay | Admitting: Nurse Practitioner

## 2023-05-22 DIAGNOSIS — R519 Headache, unspecified: Secondary | ICD-10-CM

## 2023-05-22 NOTE — Telephone Encounter (Signed)
 Ok to send

## 2023-05-31 ENCOUNTER — Ambulatory Visit: Payer: Medicare HMO | Admitting: Physician Assistant

## 2023-06-03 ENCOUNTER — Ambulatory Visit
Admission: EM | Admit: 2023-06-03 | Discharge: 2023-06-03 | Disposition: A | Discharge status: Home / Self Care | Payer: Medicare HMO | Attending: Emergency Medicine | Admitting: Emergency Medicine

## 2023-06-03 ENCOUNTER — Encounter: Payer: Self-pay | Admitting: Emergency Medicine

## 2023-06-03 ENCOUNTER — Ambulatory Visit (INDEPENDENT_AMBULATORY_CARE_PROVIDER_SITE_OTHER): Payer: Medicare HMO

## 2023-06-03 DIAGNOSIS — J101 Influenza due to other identified influenza virus with other respiratory manifestations: Secondary | ICD-10-CM | POA: Insufficient documentation

## 2023-06-03 DIAGNOSIS — R059 Cough, unspecified: Secondary | ICD-10-CM | POA: Diagnosis not present

## 2023-06-03 DIAGNOSIS — R051 Acute cough: Secondary | ICD-10-CM

## 2023-06-03 DIAGNOSIS — N289 Disorder of kidney and ureter, unspecified: Secondary | ICD-10-CM | POA: Insufficient documentation

## 2023-06-03 DIAGNOSIS — R509 Fever, unspecified: Secondary | ICD-10-CM | POA: Diagnosis not present

## 2023-06-03 LAB — RESP PANEL BY RT-PCR (RSV, FLU A&B, COVID)  RVPGX2
Influenza A by PCR: POSITIVE — AB
Influenza B by PCR: NEGATIVE
Resp Syncytial Virus by PCR: NEGATIVE
SARS Coronavirus 2 by RT PCR: NEGATIVE

## 2023-06-03 MED ORDER — OSELTAMIVIR PHOSPHATE 75 MG PO CAPS: 75.0000 mg | ORAL_CAPSULE | Freq: Once | ORAL | 0 refills | Status: AC

## 2023-06-03 MED ORDER — BENZONATATE 200 MG PO CAPS: 200.0000 mg | ORAL_CAPSULE | Freq: Three times a day (TID) | ORAL | 0 refills | Status: DC | PRN

## 2023-06-03 MED ORDER — OSELTAMIVIR PHOSPHATE 30 MG PO CAPS: 30.0000 mg | ORAL_CAPSULE | Freq: Two times a day (BID) | ORAL | 0 refills | Status: AC

## 2023-06-03 NOTE — ED Triage Notes (Signed)
Patient c/o cough, bodyaches, congestion and fever that started 3 days ago.

## 2023-06-03 NOTE — Discharge Instructions (Signed)
Your test was positive for influenza A.  You do not have COVID.  Take 75 mg of Tamiflu today.  You will start taking 30 mg of Tamiflu twice a day rating tomorrow.  Flonase, saline nasal irrigation with a [employer]Med sinus rinse and distilled water as often as you want.  Discontinue ibuprofen.  You may take 1000 g of Tylenol 3-4 times a day as needed for fevers, body aches, headaches.  Tessalon for the cough.  Follow-up with your doctor if you are not getting any better.  Go to the ED if you get worse.

## 2023-06-03 NOTE — ED Provider Notes (Signed)
HPI  SUBJECTIVE:  Tiffany Hawkins is a 78 y.o. female who presents with fevers Tmax 101, body aches, nasal congestion, rhinorrhea, sore throat, postnasal drip, shortness of breath, fatigue, headache, and mild cough productive of phlegm.  She is unable to sleep at night because of the cough.  She has been exposed to COVID.  No known flu exposure.  She got this years flu vaccine.  No wheezing.  She has been taking ibuprofen and unknown medications from her husband with improvement in her symptoms.  Last dose of ibuprofen was within  6 hours of evaluation.  No aggravating factors.  She has a past medical history of diabetes, hypertension, hyperlipidemia, chronic kidney disease, OSA, and quit smoking years ago.  She takes 800 mg of ibuprofen at night chronically to help her sleep.  PCP: [facility].  Past Medical History:  Diagnosis Date   Anxiety    Colon cancer (HCC) 10/12/2014   Stage IIB; had chemo   Diabetes mellitus without complication (HCC)    Hyperlipidemia    Hypertension    Lateral epicondylitis    Personal history of chemotherapy    Polio    childhood   Skin cancer    Sqamous Cell, Lt Calf   Sleep apnea    no CPAP, sx improved with wt loss    Past Surgical History:  Procedure Laterality Date   APPENDECTOMY     BACK SURGERY     CATARACT EXTRACTION W/PHACO Left 12/03/2017   Procedure: CATARACT EXTRACTION PHACO AND INTRAOCULAR LENS PLACEMENT (IOC) LEFT  DIABETIC;  Surgeon: Nevada Crane, MD;  Location: [facility];  Service: Ophthalmology;  Laterality: Left;  Diabetic - insulin   CATARACT EXTRACTION W/PHACO Right 01/14/2018   Procedure: CATARACT EXTRACTION PHACO AND INTRAOCULAR LENS PLACEMENT (IOC) RIGHT DIABETIC;  Surgeon: Nevada Crane, MD;  Location: [facility];  Service: Ophthalmology;  Laterality: Right;  Diabetic - insulin   CERVICAL DISC SURGERY     CHOLECYSTECTOMY     COLON SURGERY     COLONOSCOPY     COLONOSCOPY WITH PROPOFOL N/A 05/17/2015    Procedure: COLONOSCOPY WITH PROPOFOL;  Surgeon: Scot Jun, MD;  Location: [facility] ENDOSCOPY;  Service: Endoscopy;  Laterality: N/A;   COLONOSCOPY WITH PROPOFOL N/A 04/01/2018   Procedure: COLONOSCOPY WITH BIOPSY;  Surgeon: Midge Minium, MD;  Location: [facility];  Service: Endoscopy;  Laterality: N/A;  Diabetic - insulin   EMBOLIZATION (CATH LAB) N/A 01/24/2021   Procedure: EMBOLIZATION;  Surgeon: Annice Needy, MD;  Location: [facility] INVASIVE CV LAB;  Service: Cardiovascular;  Laterality: N/A;   ESOPHAGOGASTRODUODENOSCOPY     HERNIA REPAIR     LOWER EXTREMITY VENOGRAPHY Left 01/24/2021   Procedure: LOWER EXTREMITY VENOGRAPHY;  Surgeon: Annice Needy, MD;  Location: [facility] INVASIVE CV LAB;  Service: Cardiovascular;  Laterality: Left;   POLYPECTOMY N/A 04/01/2018   Procedure: POLYPECTOMY;  Surgeon: Midge Minium, MD;  Location: [facility];  Service: Endoscopy;  Laterality: N/A;   PORT A CATH INJECTION ([facility] HX)     Port has been removed   TUBAL LIGATION      Family History  Problem Relation Age of Onset   Alzheimer's disease Mother    Heart disease Father    Colon cancer Cousin    Colon cancer Cousin    Cancer Cousin        Brain Tumor   Breast cancer Neg Hx     Social History   Tobacco Use  Smoking status: Former    Current packs/day: 0.00    Average packs/day: 1.5 packs/day for 20.0 years (30.0 ttl pk-yrs)    Types: Cigarettes    Start date: 04/17/1961    Quit date: 04/17/1981    Years since quitting: 42.1   Smokeless tobacco: Never  Vaping Use   Vaping status: Never Used  Substance Use Topics   Alcohol use: No   Drug use: No    No current facility-administered medications for this encounter.  Current Outpatient Medications:    benzonatate (TESSALON) 200 MG capsule, Take 1 capsule (200 mg total) by mouth 3 (three) times daily as needed for cough., Disp: 30 capsule, Rfl: 0   oseltamivir (TAMIFLU) 30 MG capsule, Take 1 capsule (30 mg total) by mouth 2  (two) times daily for 4 days. Start on day #2, Disp: 8 capsule, Rfl: 0   oseltamivir (TAMIFLU) 75 MG capsule, Take 1 capsule (75 mg total) by mouth once for 1 dose. Take this today, Disp: 1 capsule, Rfl: 0   albuterol (VENTOLIN HFA) 108 (90 Base) MCG/ACT inhaler, Inhale 2 puffs into the lungs every 6 (six) hours as needed for wheezing or shortness of breath., Disp: 8 g, Rfl: 0   amLODipine (NORVASC) 5 MG tablet, TAKE 2 TABLETS EVERY DAY, Disp: 180 tablet, Rfl: 3   Blood Glucose Monitoring Suppl (TRUE METRIX METER) w/Device KIT, USE AS DIRECTED, Disp: 1 kit, Rfl: 0   cetirizine (ZYRTEC) 10 MG chewable tablet, Chew 1 tablet (10 mg total) by mouth daily., Disp: 90 tablet, Rfl: 1   citalopram (CELEXA) 40 MG tablet, TAKE 1 TABLET EVERY DAY, Disp: 90 tablet, Rfl: 3   cyclobenzaprine (FLEXERIL) 5 MG tablet, Take 1 tablet (5 mg total) by mouth 3 (three) times daily as needed for muscle spasms., Disp: 60 tablet, Rfl: 0   dapagliflozin propanediol (FARXIGA) 10 MG TABS tablet, Take 1 tablet (10 mg total) by mouth daily., Disp: 30 tablet, Rfl: 1   fenofibrate 54 MG tablet, TAKE 1 TABLET EVERY DAY, Disp: 90 tablet, Rfl: 3   fluticasone (FLONASE) 50 MCG/ACT nasal spray, Place 2 sprays into both nostrils daily., Disp: 48 g, Rfl: 1   insulin aspart protamine - aspart (NOVOLOG MIX 70/30 FLEXPEN) (70-30) 100 UNIT/ML FlexPen, Inject 35 units [place] QAM and inject 40units [place] QPM, Disp: 15 mL, Rfl: 11   lovastatin (MEVACOR) 20 MG tablet, TAKE 1 TABLET AT BEDTIME FOR HIGH CHOLESTEROL, Disp: 90 tablet, Rfl: 3   Multiple Vitamin (MULTIVITAMIN) tablet, Take by mouth daily. TAKES 1/2 TABLET, Disp: , Rfl:    ondansetron (ZOFRAN) 4 MG tablet, Take 1 tablet (4 mg total) by mouth every 8 (eight) hours as needed for nausea or vomiting., Disp: 20 tablet, Rfl: 0   pantoprazole (PROTONIX) 20 MG tablet, TAKE 2 TABLETS EVERY DAY, Disp: 180 tablet, Rfl: 3   sharps container, 1 each by Does not apply route as needed. To use for insulin  syringes and needles. Taking insulin at least twice daily.  E11.65, Disp: 1 each, Rfl: 5   spironolactone (ALDACTONE) 100 MG tablet, Take 100 mg by mouth daily. 2 tablets once a day, Disp: , Rfl:    tirzepatide (MOUNJARO) 2.5 MG/0.5ML Pen, Inject 2.5 mg into the skin once a week., Disp: 2 mL, Rfl: 3   TRUE METRIX BLOOD GLUCOSE TEST test strip, TEST BLOOD SUGAR THREE TIMES DAILY AFTER MEALS, Disp: 300 strip, Rfl: 3   TRUEplus Lancets 33G MISC, TEST BLOOD SUGAR THREE TIMES DAILY AS DIRECTED, Disp:  300 each, Rfl: 3  Allergies  Allergen Reactions   Lisinopril Cough     ROS  As noted in HPI.   Physical Exam  BP 127/84 (BP Location: Right Arm)   Pulse (!) 115   Temp 98.3 F (36.8 C) (Oral)   Resp 15   Ht 5\' 1"  (1.549 m)   Wt 73.2 kg   SpO2 94%   BMI 30.49 kg/m   Constitutional: Well developed, well nourished, no acute distress Eyes: PERRL, EOMI, conjunctiva normal bilaterally HENT: Normocephalic, atraumatic,mucus membranes moist.  Erythematous, swollen turbinates.  Positive mucoid nasal congestion.  Positive right maxillary sinus tenderness.  Normal oropharynx.  Tonsils surgically absent.  No postnasal drip. Neck: Positive cervical lymphadenopathy Respiratory: Clear to auscultation bilaterally, no rales, no wheezing, no rhonchi Cardiovascular: Regular tachycardia, no murmurs, no gallops, no rubs GI: nondistended,  skin: No rash, skin intact Musculoskeletal: No edema, no tenderness, no deformities Neurologic: Alert & oriented x 3, CN III-XII grossly intact, no motor deficits, sensation grossly intact Psychiatric: Speech and behavior appropriate   ED Course   Medications - No data to display  Orders Placed This Encounter  Procedures   Resp panel by RT-PCR (RSV, Flu A&B, Covid) Anterior Nasal Swab    Standing Status:   Standing    Number of Occurrences:   1   DG Chest 2 View    Standing Status:   Standing    Number of Occurrences:   1    Reason for Exam (SYMPTOM  OR  DIAGNOSIS REQUIRED):   Cough, fever, rule out pneumonia   Airborne and Contact precautions    Standing Status:   Standing    Number of Occurrences:   1   Results for orders placed or performed during the hospital encounter of 06/03/23 (from the past 24 hours)  Resp panel by RT-PCR (RSV, Flu A&B, Covid) Anterior Nasal Swab     Status: Abnormal   Collection Time: 06/03/23  1:37 PM   Specimen: Anterior Nasal Swab  Result Value Ref Range   SARS Coronavirus 2 by RT PCR NEGATIVE NEGATIVE   Influenza A by PCR POSITIVE (A) NEGATIVE   Influenza B by PCR NEGATIVE NEGATIVE   Resp Syncytial Virus by PCR NEGATIVE NEGATIVE   DG Chest 2 View Result Date: 06/03/2023 CLINICAL DATA:  Cough and fever. EXAM: CHEST - 2 VIEW COMPARISON:  07/08/2020 FINDINGS: The cardiac silhouette, mediastinal and hilar contours are normal. The lungs are clear. No infiltrates, effusions or edema. No pulmonary lesions or pneumothorax. The bony thorax is intact. IMPRESSION: No acute cardiopulmonary findings. Electronically Signed   By: Rudie Meyer M.D.   On: 06/03/2023 14:47    ED Clinical Impression  1. Influenza A   2. Acute cough   3. Renal insufficiency      ED Assessment/Plan     Patient presents with an acute illness with systemic symptoms of tachycardia.  Calculated creatinine clearance from labs done in early January 2025 42 mL/min.  Will have to renally dose the Tamiflu for 75 mg for 1 dose, then 30 mg twice a day for additional 4 days per up-to-date guidelines.  Reviewed imaging independently.  No obvious pneumonia as read by me.  No pneumonia per radiology. See radiology report for full details.  Home with saline nasal irrigation, she is to start Flonase, Tylenol 1000 mg 3 or 4 times a day, she is to discontinue the ibuprofen had a long discussion with her that this is probably not helping her  kidneys, will send home with Tessalon.  She will follow-up with her primary care provider in several days.  ER  return precautions given.  Discussed labs, imaging, MDM, treatment plan, and plan for follow-up with patient Discussed sn/sx that should prompt return to the ED. patient agrees with plan.   Meds ordered this encounter  Medications   benzonatate (TESSALON) 200 MG capsule    Sig: Take 1 capsule (200 mg total) by mouth 3 (three) times daily as needed for cough.    Dispense:  30 capsule    Refill:  0   oseltamivir (TAMIFLU) 75 MG capsule    Sig: Take 1 capsule (75 mg total) by mouth once for 1 dose. Take this today    Dispense:  1 capsule    Refill:  0    Renal dosing.  Creatinine clearance 42.   oseltamivir (TAMIFLU) 30 MG capsule    Sig: Take 1 capsule (30 mg total) by mouth 2 (two) times daily for 4 days. Start on day #2    Dispense:  8 capsule    Refill:  0      *This clinic note was created using Scientist, clinical (histocompatibility and immunogenetics). Therefore, there may be occasional mistakes despite careful proofreading. ?    Domenick Gong, MD 06/06/23 1452

## 2023-06-05 ENCOUNTER — Telehealth (INDEPENDENT_AMBULATORY_CARE_PROVIDER_SITE_OTHER): Payer: Medicare HMO | Admitting: Nurse Practitioner

## 2023-06-05 ENCOUNTER — Encounter: Payer: Self-pay | Admitting: Nurse Practitioner

## 2023-06-05 VITALS — Resp 16 | Ht 61.5 in | Wt 148.0 lb

## 2023-06-05 DIAGNOSIS — J101 Influenza due to other identified influenza virus with other respiratory manifestations: Secondary | ICD-10-CM | POA: Diagnosis not present

## 2023-06-05 DIAGNOSIS — J0100 Acute maxillary sinusitis, unspecified: Secondary | ICD-10-CM

## 2023-06-05 DIAGNOSIS — M940 Chondrocostal junction syndrome [Tietze]: Secondary | ICD-10-CM

## 2023-06-05 MED ORDER — CYCLOBENZAPRINE HCL 5 MG PO TABS: 5.0000 mg | ORAL_TABLET | Freq: Three times a day (TID) | ORAL | 0 refills | Status: AC | PRN

## 2023-06-05 MED ORDER — AZITHROMYCIN 250 MG PO TABS: ORAL_TABLET | ORAL | 0 refills | Status: AC

## 2023-06-05 MED ORDER — HYDROCOD POLI-CHLORPHE POLI ER 10-8 MG/5ML PO SUER: 5.0000 mL | Freq: Two times a day (BID) | ORAL | 0 refills | Status: DC | PRN

## 2023-06-05 NOTE — Progress Notes (Signed)
 [facility] [address] [place], [place] [address]  Internal MEDICINE  Telephone Visit  Patient Name: Tiffany Hawkins  [id number]  [id number]  Date of Service: 06/05/2023  I connected with the patient at 1710 by telephone and verified the patients identity using two identifiers.   I discussed the limitations, risks, security and privacy concerns of performing an evaluation and management service by telephone and the availability of in person appointments. I also discussed with the patient that there may be a patient responsible charge related to the service.  The patient expressed understanding and agrees to proceed.    Chief Complaint  Patient presents with   Telephone Screen    Positive flu-Kidney hurting left side of back.    Telephone Assessment    HPI Tiffany Hawkins presents for a telehealth virtual visit for Influenza A.  She reports fever, chills, body aches, muscle weakness, headache, cough, SOB, sinus pressure, sore throat.  She is out of the time period to take tamiflu but would like medication to help with cough  Current Medication: Outpatient Encounter Medications as of 06/05/2023  Medication Sig   amLODipine (NORVASC) 5 MG tablet TAKE 2 TABLETS EVERY DAY   [EXPIRED] azithromycin (ZITHROMAX) 250 MG tablet Take 2 tablets on day 1, then 1 tablet daily on days 2 through 5   benzonatate (TESSALON) 200 MG capsule Take 1 capsule (200 mg total) by mouth 3 (three) times daily as needed for cough.   Blood Glucose Monitoring Suppl (TRUE METRIX METER) w/Device KIT USE AS DIRECTED   cetirizine (ZYRTEC) 10 MG chewable tablet Chew 1 tablet (10 mg total) by mouth daily. (Patient taking differently: Chew 10 mg by mouth daily as needed.)   chlorpheniramine-HYDROcodone (TUSSIONEX) 10-8 MG/5ML Take 5 mLs by mouth every 12 (twelve) hours as needed for cough.   citalopram (CELEXA) 40 MG tablet TAKE 1 TABLET EVERY DAY   dapagliflozin propanediol (FARXIGA) 10 MG TABS tablet Take 1 tablet  (10 mg total) by mouth daily.   fenofibrate 54 MG tablet TAKE 1 TABLET EVERY DAY   fluticasone (FLONASE) 50 MCG/ACT nasal spray Place 2 sprays into both nostrils daily.   insulin aspart protamine - aspart (NOVOLOG MIX 70/30 FLEXPEN) (70-30) 100 UNIT/ML FlexPen Inject 35 units [place] QAM and inject 40units [place] QPM   lovastatin (MEVACOR) 20 MG tablet TAKE 1 TABLET AT BEDTIME FOR HIGH CHOLESTEROL   Multiple Vitamin (MULTIVITAMIN) tablet Take by mouth daily. TAKES 1/2 TABLET   [EXPIRED] oseltamivir (TAMIFLU) 30 MG capsule Take 1 capsule (30 mg total) by mouth 2 (two) times daily for 4 days. Start on day #2   pantoprazole (PROTONIX) 20 MG tablet TAKE 2 TABLETS EVERY DAY   sharps container 1 each by Does not apply route as needed. To use for insulin syringes and needles. Taking insulin at least twice daily.  E11.65   spironolactone (ALDACTONE) 100 MG tablet Take 100 mg by mouth daily. 2 tablets once a day (Patient not taking: Reported on 06/26/2023)   TRUEplus Lancets 33G MISC TEST BLOOD SUGAR THREE TIMES DAILY AS DIRECTED   [DISCONTINUED] albuterol (VENTOLIN HFA) 108 (90 Base) MCG/ACT inhaler Inhale 2 puffs into the lungs every 6 (six) hours as needed for wheezing or shortness of breath. (Patient not taking: Reported on 06/26/2023)   [DISCONTINUED] cyclobenzaprine (FLEXERIL) 5 MG tablet Take 1 tablet (5 mg total) by mouth 3 (three) times daily as needed for muscle spasms.   [DISCONTINUED] ondansetron (ZOFRAN) 4 MG tablet Take 1 tablet (4 mg total)  by mouth every 8 (eight) hours as needed for nausea or vomiting.   [DISCONTINUED] tirzepatide [facility]) 2.5 MG/0.5ML Pen Inject 2.5 mg into the skin once a week. (Patient taking differently: Inject 2.5 mg into the skin once a week. Thursday)   [DISCONTINUED] TRUE METRIX BLOOD GLUCOSE TEST test strip TEST BLOOD SUGAR THREE TIMES DAILY AFTER MEALS   cyclobenzaprine (FLEXERIL) 5 MG tablet Take 1 tablet (5 mg total) by mouth 3 (three) times daily as needed for muscle  spasms.   No facility-administered encounter medications on file as of 06/05/2023.    Surgical History: Past Surgical History:  Procedure Laterality Date   APPENDECTOMY     BACK SURGERY     CATARACT EXTRACTION W/PHACO Left 12/03/2017   Procedure: CATARACT EXTRACTION PHACO AND INTRAOCULAR LENS PLACEMENT (IOC) LEFT  DIABETIC;  Surgeon: Nevada Crane, MD;  Location: [facility];  Service: Ophthalmology;  Laterality: Left;  Diabetic - insulin   CATARACT EXTRACTION W/PHACO Right 01/14/2018   Procedure: CATARACT EXTRACTION PHACO AND INTRAOCULAR LENS PLACEMENT (IOC) RIGHT DIABETIC;  Surgeon: Nevada Crane, MD;  Location: [facility];  Service: Ophthalmology;  Laterality: Right;  Diabetic - insulin   CERVICAL DISC SURGERY     CHOLECYSTECTOMY     COLON SURGERY     COLONOSCOPY     COLONOSCOPY N/A 07/03/2023   Procedure: COLONOSCOPY;  Surgeon: Toney Reil, MD;  Location: [facility];  Service: Endoscopy;  Laterality: N/A;  Diabetic   COLONOSCOPY WITH PROPOFOL N/A 05/17/2015   Procedure: COLONOSCOPY WITH PROPOFOL;  Surgeon: Scot Jun, MD;  Location: [facility] ENDOSCOPY;  Service: Endoscopy;  Laterality: N/A;   COLONOSCOPY WITH PROPOFOL N/A 04/01/2018   Procedure: COLONOSCOPY WITH BIOPSY;  Surgeon: Midge Minium, MD;  Location: [facility];  Service: Endoscopy;  Laterality: N/A;  Diabetic - insulin   EMBOLIZATION (CATH LAB) N/A 01/24/2021   Procedure: EMBOLIZATION;  Surgeon: Annice Needy, MD;  Location: [facility] INVASIVE CV LAB;  Service: Cardiovascular;  Laterality: N/A;   ESOPHAGOGASTRODUODENOSCOPY     HERNIA REPAIR     LOWER EXTREMITY VENOGRAPHY Left 01/24/2021   Procedure: LOWER EXTREMITY VENOGRAPHY;  Surgeon: Annice Needy, MD;  Location: [facility] INVASIVE CV LAB;  Service: Cardiovascular;  Laterality: Left;   POLYPECTOMY N/A 04/01/2018   Procedure: POLYPECTOMY;  Surgeon: Midge Minium, MD;  Location: [facility];  Service: Endoscopy;  Laterality:  N/A;   POLYPECTOMY  07/03/2023   Procedure: POLYPECTOMY;  Surgeon: Toney Reil, MD;  Location: [facility];  Service: Endoscopy;;   PORT A CATH INJECTION ([facility] HX)     Port has been removed   TUBAL LIGATION      Medical History: Past Medical History:  Diagnosis Date   Anxiety    Atherosclerosis of aorta (HCC)    Colon cancer (HCC) 10/12/2014   Stage IIB; had chemo   Diabetes mellitus without complication (HCC)    Hyperlipidemia    Hypertension    Intractable episodic headache    Lateral epicondylitis    OSA on CPAP    OSA on CPAP    Personal history of chemotherapy    Polio    childhood   Skin cancer    Sqamous Cell, Lt Calf   Sleep apnea    CPAP   Uncontrolled diabetes mellitus with hyperglycemia (HCC)     Family History: Family History  Problem Relation Age of Onset   Alzheimer's disease Mother    Heart disease Father    Colon  cancer Cousin    Colon cancer Cousin    Cancer Cousin        Brain Tumor   Breast cancer Neg Hx     Social History   Socioeconomic History   Marital status: Married    Spouse name: Not on file   Number of children: Not on file   Years of education: Not on file   Highest education level: Not on file  Occupational History   Not on file  Tobacco Use   Smoking status: Former    Current packs/day: 0.00    Average packs/day: 1.5 packs/day for 20.0 years (30.0 ttl pk-yrs)    Types: Cigarettes    Start date: 04/17/1961    Quit date: 04/17/1981    Years since quitting: 42.2   Smokeless tobacco: Never  Vaping Use   Vaping status: Never Used  Substance and Sexual Activity   Alcohol use: No   Drug use: No   Sexual activity: Yes  Other Topics Concern   Not on file  Social History Narrative   Not on file   Social Drivers of Health   Financial Resource Strain: Low Risk  (06/22/2021)   Overall Financial Resource Strain (CARDIA)    Difficulty of Paying Living Expenses: Not hard at all  Food Insecurity: Not on file   Transportation Needs: No Transportation Needs (06/22/2021)   PRAPARE - Administrator, Civil Service (Medical): No    Lack of Transportation (Non-Medical): No  Physical Activity: Not on file  Stress: Not on file  Social Connections: Not on file  Intimate Partner Violence: Not on file      Review of Systems  HENT:  Positive for congestion, postnasal drip, rhinorrhea, sinus pressure and sore throat. Negative for ear pain.   Respiratory:  Positive for cough and shortness of breath. Negative for chest tightness and wheezing.   Cardiovascular: Negative.  Negative for chest pain and palpitations.  Gastrointestinal: Negative.   Musculoskeletal:  Positive for arthralgias, back pain and myalgias (body aches).  Neurological:  Positive for weakness and headaches.    Vital Signs: Resp 16   Ht 5' 1.5" (1.562 m)   Wt 148 lb (67.1 kg)   BMI 27.51 kg/m    Observation/Objective: She is alert and oriented.  No acute distress noted.    Assessment/Plan: 1. Acute non-recurrent maxillary sinusitis (Primary) Zpak prescribed, take until gone. Take cough medication as needed for cough relief.  - chlorpheniramine-HYDROcodone (TUSSIONEX) 10-8 MG/5ML; Take 5 mLs by mouth every 12 (twelve) hours as needed for cough.  Dispense: 140 mL; Refill: 0 - azithromycin (ZITHROMAX) 250 MG tablet; Take 2 tablets on day 1, then 1 tablet daily on days 2 through 5  Dispense: 6 tablet; Refill: 0  2. Influenza A Cough medication prescribed for symptom relief.  - chlorpheniramine-HYDROcodone (TUSSIONEX) 10-8 MG/5ML; Take 5 mLs by mouth every 12 (twelve) hours as needed for cough.  Dispense: 140 mL; Refill: 0  3. Costochondritis May take cyclobenzaprine as needed as prescribed.  - cyclobenzaprine (FLEXERIL) 5 MG tablet; Take 1 tablet (5 mg total) by mouth 3 (three) times daily as needed for muscle spasms.  Dispense: 60 tablet; Refill: 0   General Counseling: najma bozarth understanding of the  findings of today's phone visit and agrees with plan of treatment. I have discussed any further diagnostic evaluation that may be needed or ordered today. We also reviewed her medications today. she has been encouraged to call the office with any questions or  concerns that should arise related to todays visit.  Return if symptoms worsen or fail to improve.   No orders of the defined types were placed in this encounter.   Meds ordered this encounter  Medications   chlorpheniramine-HYDROcodone (TUSSIONEX) 10-8 MG/5ML    Sig: Take 5 mLs by mouth every 12 (twelve) hours as needed for cough.    Dispense:  140 mL    Refill:  0    Fill new script today   cyclobenzaprine (FLEXERIL) 5 MG tablet    Sig: Take 1 tablet (5 mg total) by mouth 3 (three) times daily as needed for muscle spasms.    Dispense:  60 tablet    Refill:  0    Fill new script today   azithromycin (ZITHROMAX) 250 MG tablet    Sig: Take 2 tablets on day 1, then 1 tablet daily on days 2 through 5    Dispense:  6 tablet    Refill:  0    Time spent:10 Minutes Time spent with patient included reviewing progress notes, labs, imaging studies, and discussing plan for follow up.  [place] Controlled Substance Database was reviewed by me for overdose risk score (ORS) if appropriate.  This patient was seen by Sallyanne Kuster, FNP-C in collaboration with Dr. Beverely Risen as a part of collaborative care agreement.  Hiroyuki Ozanich R. Tedd Sias, MSN, FNP-C Internal medicine

## 2023-06-19 ENCOUNTER — Other Ambulatory Visit: Payer: Self-pay | Admitting: Nurse Practitioner

## 2023-06-19 DIAGNOSIS — E1165 Type 2 diabetes mellitus with hyperglycemia: Secondary | ICD-10-CM

## 2023-06-26 ENCOUNTER — Encounter: Payer: Self-pay | Admitting: Gastroenterology

## 2023-06-26 ENCOUNTER — Other Ambulatory Visit: Payer: Self-pay

## 2023-06-26 ENCOUNTER — Ambulatory Visit (INDEPENDENT_AMBULATORY_CARE_PROVIDER_SITE_OTHER): Payer: Medicare HMO | Admitting: Gastroenterology

## 2023-06-26 VITALS — BP 123/75 | HR 92 | Temp 97.5°F | Ht 61.5 in | Wt 152.4 lb

## 2023-06-26 DIAGNOSIS — Z87891 Personal history of nicotine dependence: Secondary | ICD-10-CM

## 2023-06-26 DIAGNOSIS — Z85038 Personal history of other malignant neoplasm of large intestine: Secondary | ICD-10-CM

## 2023-06-26 DIAGNOSIS — Z860101 Personal history of adenomatous and serrated colon polyps: Secondary | ICD-10-CM

## 2023-06-26 DIAGNOSIS — Z08 Encounter for follow-up examination after completed treatment for malignant neoplasm: Secondary | ICD-10-CM

## 2023-06-26 DIAGNOSIS — Z860100 Personal history of colon polyps, unspecified: Secondary | ICD-10-CM

## 2023-06-26 MED ORDER — SUTAB 1479-225-188 MG PO TABS: 12.0000 | ORAL_TABLET | Freq: Once | ORAL | 0 refills | Status: AC

## 2023-06-26 MED ORDER — ONDANSETRON HCL 4 MG PO TABS: 4.0000 mg | ORAL_TABLET | Freq: Three times a day (TID) | ORAL | 0 refills | Status: AC | PRN

## 2023-06-26 NOTE — Progress Notes (Signed)
 Tiffany Repress, MD [address]  [address]  [place], [place] [address]  Main: [phone]  Fax: [phone]    Gastroenterology Consultation  Referring Provider:     Sallyanne Kuster, NP Primary Care Physician:  Sallyanne Kuster, NP Primary Gastroenterologist:  Dr. Arlyss Hawkins Reason for Consultation:   To discuss about colonoscopy, personal history of colon cancer        HPI:   Tiffany Hawkins is a 78 y.o. female referred by Sallyanne Kuster, NP  for consultation of scheduling colonoscopy.  She had history of diagnosed 05/2011, s/p laparoscopic right hemicolectomy in 10/2011, has been in remission since then, undergoing surveillance colonoscopies, identified precancerous polyps less than 1 cm in size Patient has diabetes, not been under control, her biggest weakness is uncontrolled carbohydrate intake  She does not have any other concerns today  NSAIDs: None  Antiplts/Anticoagulants/Anti thrombotics: None  GI Procedures:  Colonoscopy 04/01/2018 - Diverticulosis in the sigmoid colon and in the descending colon. - One 5 mm polyp in the sigmoid colon, removed with a cold snare. Resected and retrieved. Clip ( MR conditional) was placed. - Non- bleeding internal hemorrhoids. Tubular adenoma  DIAGNOSIS:  A. COLON POLYP, TRANSVERSE; HOT SNARE:  - SESSILE SERRATED ADENOMA.  - NEGATIVE FOR HIGH-GRADE DYSPLASIA AND MALIGNANCY.   B. COLON WITH SMALL ULCERATION, TRANSVERSE; COLD BIOPSY:  - MILD ACTIVE COLITIS WITH ADJACENT ULCER BED.  - NEGATIVE FOR VIRAL CYTOPATHIC EFFECT, DYSPLASIA, AND MALIGNANCY.   Colonoscopy 04/2015 DIAGNOSIS:  A. COLON POLYP, TRANSVERSE; HOT SNARE:  - SESSILE SERRATED ADENOMA.  - NEGATIVE FOR HIGH-GRADE DYSPLASIA AND MALIGNANCY.   B. COLON WITH SMALL ULCERATION, TRANSVERSE; COLD BIOPSY:  - MILD ACTIVE COLITIS WITH ADJACENT ULCER BED.  - NEGATIVE FOR VIRAL CYTOPATHIC EFFECT, DYSPLASIA, AND MALIGNANCY.   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; had chemo   Diabetes mellitus without complication (HCC)    Hyperlipidemia    Hypertension    Lateral epicondylitis    Personal history of chemotherapy    Polio    childhood   Skin cancer    Sqamous Cell, Lt Calf   Sleep apnea    no CPAP, sx improved with wt loss    Past Surgical History:  Procedure Laterality Date   APPENDECTOMY     BACK SURGERY     CATARACT EXTRACTION W/PHACO Left 12/03/2017   Procedure: CATARACT EXTRACTION PHACO AND INTRAOCULAR LENS PLACEMENT (IOC) LEFT  DIABETIC;  Surgeon: Nevada Crane, MD;  Location: [facility];  Service: Ophthalmology;  Laterality: Left;  Diabetic - insulin   CATARACT EXTRACTION W/PHACO Right 01/14/2018   Procedure: CATARACT EXTRACTION PHACO AND INTRAOCULAR LENS PLACEMENT (IOC) RIGHT DIABETIC;  Surgeon: Nevada Crane, MD;  Location: [facility];  Service: Ophthalmology;  Laterality: Right;  Diabetic - insulin   CERVICAL DISC SURGERY     CHOLECYSTECTOMY     COLON SURGERY     COLONOSCOPY     COLONOSCOPY WITH PROPOFOL N/A 05/17/2015   Procedure: COLONOSCOPY WITH PROPOFOL;  Surgeon: Scot Jun, MD;  Location: [facility];  Service: Endoscopy;  Laterality: N/A;   COLONOSCOPY WITH PROPOFOL N/A 04/01/2018   Procedure: COLONOSCOPY WITH BIOPSY;  Surgeon: Midge Minium, MD;  Location: [facility];  Service: Endoscopy;  Laterality: N/A;  Diabetic - insulin   EMBOLIZATION (CATH LAB) N/A 01/24/2021   Procedure: EMBOLIZATION;  Surgeon: Annice Needy, MD;  Location: [facility] INVASIVE CV LAB;  Service: Cardiovascular;  Laterality: N/A;   ESOPHAGOGASTRODUODENOSCOPY     HERNIA REPAIR     LOWER EXTREMITY VENOGRAPHY Left 01/24/2021   Procedure: LOWER EXTREMITY VENOGRAPHY;  Surgeon: Annice Needy, MD;  Location: [facility] INVASIVE CV LAB;  Service: Cardiovascular;  Laterality: Left;   POLYPECTOMY N/A 04/01/2018   Procedure: POLYPECTOMY;  Surgeon: Midge Minium, MD;  Location:  [facility];  Service: Endoscopy;  Laterality: N/A;   PORT A CATH INJECTION ([facility] HX)     Port has been removed   TUBAL LIGATION       Current Outpatient Medications:    albuterol (VENTOLIN HFA) 108 (90 Base) MCG/ACT inhaler, Inhale 2 puffs into the lungs every 6 (six) hours as needed for wheezing or shortness of breath., Disp: 8 g, Rfl: 0   amLODipine (NORVASC) 5 MG tablet, TAKE 2 TABLETS EVERY DAY, Disp: 180 tablet, Rfl: 3   benzonatate (TESSALON) 200 MG capsule, Take 1 capsule (200 mg total) by mouth 3 (three) times daily as needed for cough., Disp: 30 capsule, Rfl: 0   Blood Glucose Monitoring Suppl (TRUE METRIX METER) w/Device KIT, USE AS DIRECTED, Disp: 1 kit, Rfl: 0   cetirizine (ZYRTEC) 10 MG chewable tablet, Chew 1 tablet (10 mg total) by mouth daily., Disp: 90 tablet, Rfl: 1   chlorpheniramine-HYDROcodone (TUSSIONEX) 10-8 MG/5ML, Take 5 mLs by mouth every 12 (twelve) hours as needed for cough., Disp: 140 mL, Rfl: 0   citalopram (CELEXA) 40 MG tablet, TAKE 1 TABLET EVERY DAY, Disp: 90 tablet, Rfl: 3   cyclobenzaprine (FLEXERIL) 5 MG tablet, Take 1 tablet (5 mg total) by mouth 3 (three) times daily as needed for muscle spasms., Disp: 60 tablet, Rfl: 0   dapagliflozin propanediol (FARXIGA) 10 MG TABS tablet, Take 1 tablet (10 mg total) by mouth daily., Disp: 30 tablet, Rfl: 1   fenofibrate 54 MG tablet, TAKE 1 TABLET EVERY DAY, Disp: 90 tablet, Rfl: 3   fluticasone (FLONASE) 50 MCG/ACT nasal spray, Place 2 sprays into both nostrils daily., Disp: 48 g, Rfl: 1   insulin aspart protamine - aspart (NOVOLOG MIX 70/30 FLEXPEN) (70-30) 100 UNIT/ML FlexPen, Inject 35 units [place] QAM and inject 40units [place] QPM, Disp: 15 mL, Rfl: 11   lovastatin (MEVACOR) 20 MG tablet, TAKE 1 TABLET AT BEDTIME FOR HIGH CHOLESTEROL, Disp: 90 tablet, Rfl: 3   Multiple Vitamin (MULTIVITAMIN) tablet, Take by mouth daily. TAKES 1/2 TABLET, Disp: , Rfl:    ondansetron (ZOFRAN) 4 MG tablet, Take 1 tablet (4 mg total)  by mouth every 8 (eight) hours as needed for nausea or vomiting., Disp: 20 tablet, Rfl: 0   ondansetron (ZOFRAN) 4 MG tablet, Take 1 tablet (4 mg total) by mouth every 8 (eight) hours as needed for nausea or vomiting., Disp: 10 tablet, Rfl: 0   pantoprazole (PROTONIX) 20 MG tablet, TAKE 2 TABLETS EVERY DAY, Disp: 180 tablet, Rfl: 3   sharps container, 1 each by Does not apply route as needed. To use for insulin syringes and needles. Taking insulin at least twice daily.  E11.65, Disp: 1 each, Rfl: 5   Sodium Sulfate-Mag Sulfate-KCl (SUTAB) [phone] MG TABS, Take 12 tablets by mouth once for 1 dose., Disp: 24 tablet, Rfl: 0   tirzepatide (MOUNJARO) 2.5 MG/0.5ML Pen, Inject 2.5 mg into the skin once a week., Disp: 2 mL, Rfl: 3   TRUE METRIX BLOOD GLUCOSE TEST test strip, TEST BLOOD SUGAR THREE TIMES DAILY AFTER MEALS, Disp: 300 strip, Rfl:  3   TRUEplus Lancets 33G MISC, TEST BLOOD SUGAR THREE TIMES DAILY AS DIRECTED, Disp: 300 each, Rfl: 3   spironolactone (ALDACTONE) 100 MG tablet, Take 100 mg by mouth daily. 2 tablets once a day (Patient not taking: Reported on 06/26/2023), Disp: , Rfl:    Family History  Problem Relation Age of Onset   Alzheimer's disease Mother    Heart disease Father    Colon cancer Cousin    Colon cancer Cousin    Cancer Cousin        Brain Tumor   Breast cancer Neg Hx      Social History   Tobacco Use   Smoking status: Former    Current packs/day: 0.00    Average packs/day: 1.5 packs/day for 20.0 years (30.0 ttl pk-yrs)    Types: Cigarettes    Start date: 04/17/1961    Quit date: 04/17/1981    Years since quitting: 42.2   Smokeless tobacco: Never  Vaping Use   Vaping status: Never Used  Substance Use Topics   Alcohol use: No   Drug use: No    Allergies as of 06/26/2023 - Review Complete 06/26/2023  Allergen Reaction Noted   Lisinopril Cough 06/22/2020    Review of Systems:    All systems reviewed and negative except where noted in HPI.   Physical  Exam:  BP 123/75 (BP Location: Left Arm, Patient Position: Sitting, Cuff Size: Normal)   Pulse 92   Temp (!) 97.5 F (36.4 C) (Oral)   Ht 5' 1.5" (1.562 m)   Wt 152 lb 6 oz (69.1 kg)   BMI 28.32 kg/m  No LMP recorded. Patient is postmenopausal.  General:   Alert,  Well-developed, well-nourished, pleasant and cooperative in NAD Head:  Normocephalic and atraumatic. Eyes:  Sclera clear, no icterus.   Conjunctiva pink. Ears:  Normal auditory acuity. Nose:  No deformity, discharge, or lesions. Mouth:  No deformity or lesions,oropharynx pink & moist. Neck:  Supple; no masses or thyromegaly. Lungs:  Respirations even and unlabored.  Clear throughout to auscultation.   No wheezes, crackles, or rhonchi. No acute distress. Heart:  Regular rate and rhythm; no murmurs, clicks, rubs, or gallops. Abdomen:  Normal bowel sounds. Soft, non-tender and non-distended without masses, hepatosplenomegaly or hernias noted.  No guarding or rebound tenderness.   Rectal: Not performed Msk:  Symmetrical without gross deformities. Good, equal movement & strength bilaterally. Pulses:  Normal pulses noted. Extremities:  No clubbing or edema.  No cyanosis. Neurologic:  Alert and oriented x3;  grossly normal neurologically. Skin:  Intact without significant lesions or rashes. No jaundice. Psych:  Alert and cooperative. Normal mood and affect.  Imaging Studies: Reviewed  Assessment and Plan:   ISABELL BONAFEDE is a 78 y.o. female with diabetes, overweight, history of right colon cancer diagnosed in 09/2011's, s/p laparoscopic right hemicolectomy in 10/2011, history of precancerous polyps  Patient does not have any major comorbidities other than diabetes and functionally independent Therefore, discussed about continuing surveillance colonoscopy and she is agreeable  I have discussed alternative options, risks & benefits,  which include, but are not limited to, bleeding, infection, perforation,respiratory  complication & drug reaction.  The patient agrees with this plan & written consent will be obtained.     Follow up as needed   Tiffany Repress, MD

## 2023-06-27 ENCOUNTER — Encounter: Payer: Self-pay | Admitting: Gastroenterology

## 2023-06-28 DIAGNOSIS — R69 Illness, unspecified: Secondary | ICD-10-CM | POA: Diagnosis not present

## 2023-07-03 ENCOUNTER — Ambulatory Visit
Admission: RE | Admit: 2023-07-03 | Discharge: 2023-07-03 | Disposition: A | Discharge status: Home / Self Care | Attending: Gastroenterology | Admitting: Gastroenterology

## 2023-07-03 ENCOUNTER — Ambulatory Visit: Payer: Self-pay | Admitting: Anesthesiology

## 2023-07-03 ENCOUNTER — Other Ambulatory Visit: Payer: Self-pay

## 2023-07-03 ENCOUNTER — Encounter: Payer: Self-pay | Admitting: Gastroenterology

## 2023-07-03 ENCOUNTER — Encounter
Admission: RE | Disposition: A | Discharge status: Home / Self Care | Payer: Self-pay | Source: Home / Self Care | Attending: Gastroenterology

## 2023-07-03 DIAGNOSIS — D124 Benign neoplasm of descending colon: Secondary | ICD-10-CM | POA: Insufficient documentation

## 2023-07-03 DIAGNOSIS — E1165 Type 2 diabetes mellitus with hyperglycemia: Secondary | ICD-10-CM | POA: Diagnosis not present

## 2023-07-03 DIAGNOSIS — K621 Rectal polyp: Secondary | ICD-10-CM | POA: Diagnosis not present

## 2023-07-03 DIAGNOSIS — Z7985 Long-term (current) use of injectable non-insulin antidiabetic drugs: Secondary | ICD-10-CM | POA: Diagnosis not present

## 2023-07-03 DIAGNOSIS — K648 Other hemorrhoids: Secondary | ICD-10-CM | POA: Diagnosis not present

## 2023-07-03 DIAGNOSIS — Z1211 Encounter for screening for malignant neoplasm of colon: Secondary | ICD-10-CM | POA: Diagnosis not present

## 2023-07-03 DIAGNOSIS — Z85038 Personal history of other malignant neoplasm of large intestine: Secondary | ICD-10-CM | POA: Insufficient documentation

## 2023-07-03 DIAGNOSIS — D123 Benign neoplasm of transverse colon: Secondary | ICD-10-CM | POA: Insufficient documentation

## 2023-07-03 DIAGNOSIS — E119 Type 2 diabetes mellitus without complications: Secondary | ICD-10-CM | POA: Insufficient documentation

## 2023-07-03 DIAGNOSIS — K635 Polyp of colon: Secondary | ICD-10-CM | POA: Diagnosis not present

## 2023-07-03 DIAGNOSIS — K644 Residual hemorrhoidal skin tags: Secondary | ICD-10-CM | POA: Insufficient documentation

## 2023-07-03 DIAGNOSIS — Z980 Intestinal bypass and anastomosis status: Secondary | ICD-10-CM | POA: Insufficient documentation

## 2023-07-03 DIAGNOSIS — I700 Atherosclerosis of aorta: Secondary | ICD-10-CM | POA: Insufficient documentation

## 2023-07-03 DIAGNOSIS — Z87891 Personal history of nicotine dependence: Secondary | ICD-10-CM | POA: Insufficient documentation

## 2023-07-03 DIAGNOSIS — Z9221 Personal history of antineoplastic chemotherapy: Secondary | ICD-10-CM | POA: Insufficient documentation

## 2023-07-03 DIAGNOSIS — G4733 Obstructive sleep apnea (adult) (pediatric): Secondary | ICD-10-CM | POA: Diagnosis not present

## 2023-07-03 DIAGNOSIS — Z09 Encounter for follow-up examination after completed treatment for conditions other than malignant neoplasm: Secondary | ICD-10-CM | POA: Diagnosis not present

## 2023-07-03 DIAGNOSIS — Z794 Long term (current) use of insulin: Secondary | ICD-10-CM | POA: Diagnosis not present

## 2023-07-03 DIAGNOSIS — N182 Chronic kidney disease, stage 2 (mild): Secondary | ICD-10-CM | POA: Diagnosis not present

## 2023-07-03 DIAGNOSIS — K5730 Diverticulosis of large intestine without perforation or abscess without bleeding: Secondary | ICD-10-CM | POA: Insufficient documentation

## 2023-07-03 DIAGNOSIS — Z7984 Long term (current) use of oral hypoglycemic drugs: Secondary | ICD-10-CM | POA: Diagnosis not present

## 2023-07-03 DIAGNOSIS — Z860100 Personal history of colon polyps, unspecified: Secondary | ICD-10-CM | POA: Diagnosis not present

## 2023-07-03 DIAGNOSIS — I129 Hypertensive chronic kidney disease with stage 1 through stage 4 chronic kidney disease, or unspecified chronic kidney disease: Secondary | ICD-10-CM | POA: Diagnosis not present

## 2023-07-03 DIAGNOSIS — I10 Essential (primary) hypertension: Secondary | ICD-10-CM | POA: Diagnosis not present

## 2023-07-03 DIAGNOSIS — E1122 Type 2 diabetes mellitus with diabetic chronic kidney disease: Secondary | ICD-10-CM | POA: Diagnosis not present

## 2023-07-03 HISTORY — DX: Type 2 diabetes mellitus with hyperglycemia: E11.65

## 2023-07-03 HISTORY — PX: POLYPECTOMY: SHX5525

## 2023-07-03 HISTORY — DX: Obstructive sleep apnea (adult) (pediatric): G47.33

## 2023-07-03 HISTORY — PX: COLONOSCOPY: SHX5424

## 2023-07-03 HISTORY — DX: Atherosclerosis of aorta: I70.0

## 2023-07-03 HISTORY — DX: Headache, unspecified: R51.9

## 2023-07-03 LAB — GLUCOSE, CAPILLARY: Glucose-Capillary: 120 mg/dL — ABNORMAL HIGH (ref 70–99)

## 2023-07-03 SURGERY — COLONOSCOPY
Anesthesia: General | Site: Rectum

## 2023-07-03 MED ORDER — STERILE WATER FOR IRRIGATION IR SOLN
Status: DC | PRN
Start: 2023-07-03 — End: 2023-07-03
  Administered 2023-07-03: 1

## 2023-07-03 MED ORDER — SODIUM CHLORIDE 0.9 % IV SOLN
INTRAVENOUS | Status: DC
Start: 2023-07-03 — End: 2023-07-03

## 2023-07-03 MED ORDER — GLYCOPYRROLATE 0.2 MG/ML IJ SOLN
INTRAMUSCULAR | Status: DC | PRN
  Administered 2023-07-03: .2 mg via INTRAVENOUS

## 2023-07-03 MED ORDER — LIDOCAINE HCL (PF) 2 % IJ SOLN
INTRAMUSCULAR | Status: AC
  Filled 2023-07-03: qty 5

## 2023-07-03 MED ORDER — LACTATED RINGERS IV SOLN: INTRAVENOUS | Status: DC

## 2023-07-03 MED ORDER — GLYCOPYRROLATE 0.2 MG/ML IJ SOLN
INTRAMUSCULAR | Status: AC
  Filled 2023-07-03: qty 1

## 2023-07-03 MED ORDER — LIDOCAINE HCL (CARDIAC) PF 100 MG/5ML IV SOSY
PREFILLED_SYRINGE | INTRAVENOUS | Status: DC | PRN
  Administered 2023-07-03: 20 mg via INTRAVENOUS

## 2023-07-03 MED ORDER — PROPOFOL 10 MG/ML IV BOLUS
INTRAVENOUS | Status: DC | PRN
  Administered 2023-07-03 (×4): 30 mg via INTRAVENOUS
  Administered 2023-07-03: 100 mg via INTRAVENOUS
  Administered 2023-07-03: 20 mg via INTRAVENOUS
  Administered 2023-07-03: 30 mg via INTRAVENOUS
  Administered 2023-07-03: 20 mg via INTRAVENOUS
  Administered 2023-07-03: 30 mg via INTRAVENOUS

## 2023-07-03 MED ORDER — PROPOFOL 10 MG/ML IV BOLUS
INTRAVENOUS | Status: AC
Start: 2023-07-03 — End: ?
  Filled 2023-07-03: qty 20

## 2023-07-03 SURGICAL SUPPLY — 16 items
CLIP HMST 235XBRD CATH ROT (MISCELLANEOUS) IMPLANT
ELECT REM PT RETURN 9FT ADLT (ELECTROSURGICAL) IMPLANT
ELECTRODE REM PT RTRN 9FT ADLT (ELECTROSURGICAL) IMPLANT
FORCEPS BIOP RAD 4 LRG CAP 4 (CUTTING FORCEPS) IMPLANT
GOWN CVR UNV OPN BCK APRN NK (MISCELLANEOUS) ×4 IMPLANT
INJECTOR VARIJECT VIN23 (MISCELLANEOUS) IMPLANT
KIT DEFENDO VALVE AND CONN (KITS) IMPLANT
KIT PRC NS LF DISP ENDO (KITS) ×2 IMPLANT
MANIFOLD NEPTUNE II (INSTRUMENTS) ×2 IMPLANT
MARKER SPOT ENDO TATTOO 5ML (MISCELLANEOUS) IMPLANT
PROBE APC STR FIRE (PROBE) IMPLANT
RETRIEVER NET ROTH 2.5X230 LF (MISCELLANEOUS) IMPLANT
SNARE COLD EXACTO (MISCELLANEOUS) IMPLANT
TRAP ETRAP POLY (MISCELLANEOUS) IMPLANT
VARIJECT INJECTOR VIN23 (MISCELLANEOUS) IMPLANT
WATER STERILE IRR 250ML POUR (IV SOLUTION) ×2 IMPLANT

## 2023-07-03 NOTE — Anesthesia Postprocedure Evaluation (Signed)
 Anesthesia Post Note  Patient: Tiffany Hawkins  Procedure(s) Performed: COLONOSCOPY POLYPECTOMY (Rectum)  Patient location during evaluation: PACU Anesthesia Type: General Level of consciousness: awake and alert Pain management: pain level controlled Vital Signs Assessment: post-procedure vital signs reviewed and stable Respiratory status: spontaneous breathing, nonlabored ventilation, respiratory function stable and patient connected to nasal cannula oxygen Cardiovascular status: blood pressure returned to baseline and stable Postop Assessment: no apparent nausea or vomiting Anesthetic complications: no   No notable events documented.   Last Vitals:  Vitals:   07/03/23 1009 07/03/23 1015  BP: 120/74 126/84  Pulse: (!) 110 (!) 110  Resp: (!) 23 19  Temp: 36.5 C   SpO2: 95% 94%    Last Pain:  Vitals:   07/03/23 1015  TempSrc:   PainSc: 0-No pain                 Toluwani Ruder C Iverson Sees

## 2023-07-03 NOTE — Op Note (Signed)
 [facility] Gastroenterology Patient Name: Tiffany Hawkins Procedure Date: 07/03/2023 9:20 AM MRN: [id number] Account #: [id number] Date of Birth: Apr 09, 1946 Admit Type: Outpatient Age: 78 Room: [facility] OR ROOM 01 Gender: Female Note Status: Finalized Instrument Name: [id number] Procedure:             Colonoscopy Indications:           Surveillance: Personal history of adenomatous polyps                         on last colonoscopy 5 years ago, High risk colon                         cancer surveillance: Personal history of colon cancer,                         Last colonoscopy: December 2019 Providers:             Toney Reil MD, MD Referring MD:          Sallyanne Kuster (Referring MD) Medicines:             General Anesthesia Complications:         No immediate complications. Estimated blood loss: None. Procedure:             Pre-Anesthesia Assessment:                        - Prior to the procedure, a History and Physical was                         performed, and patient medications and allergies were                         reviewed. The patient is competent. The risks and                         benefits of the procedure and the sedation options and                         risks were discussed with the patient. All questions                         were answered and informed consent was obtained.                         Patient identification and proposed procedure were                         verified by the physician, the nurse, the                         anesthesiologist, the anesthetist and the technician                         in the pre-procedure area in the procedure room in the                         endoscopy suite. Mental Status Examination: alert and  oriented. Airway Examination: normal oropharyngeal                         airway and neck mobility. Respiratory Examination:                         clear to auscultation.  CV Examination: normal.                         Prophylactic Antibiotics: The patient does not require                         prophylactic antibiotics. Prior Anticoagulants: The                         patient has taken no anticoagulant or antiplatelet                         agents. ASA Grade Assessment: III - A patient with                         severe systemic disease. After reviewing the risks and                         benefits, the patient was deemed in satisfactory                         condition to undergo the procedure. The anesthesia                         plan was to use general anesthesia. Immediately prior                         to administration of medications, the patient was                         re-assessed for adequacy to receive sedatives. The                         heart rate, respiratory rate, oxygen saturations,                         blood pressure, adequacy of pulmonary ventilation, and                         response to care were monitored throughout the                         procedure. The physical status of the patient was                         re-assessed after the procedure.                        After obtaining informed consent, the colonoscope was                         passed under direct vision. Throughout the procedure,  the patient's blood pressure, pulse, and oxygen                         saturations were monitored continuously. The                         Colonoscope was introduced through the anus and                         advanced to the the ileocolonic anastomosis. The                         colonoscopy was performed with moderate difficulty due                         to multiple diverticula in the colon, significant                         looping, a tortuous colon and the patient's body                         habitus. Successful completion of the procedure was                         aided by applying  abdominal pressure. The patient                         tolerated the procedure well. The quality of the bowel                         preparation was evaluated using the BBPS [facility] Bowel                         Preparation Scale) with scores of: Right Colon = 3,                         Transverse Colon = 3 and Left Colon = 3 (entire mucosa                         seen well with no residual staining, small fragments                         of stool or opaque liquid). The total BBPS score                         equals 9. Findings:      The perianal and digital rectal examinations were normal. Pertinent       negatives include normal sphincter tone and no palpable rectal lesions.      There was evidence of a prior end-to-side ileo-colonic anastomosis in       the ascending colon. This was patent and was characterized by healthy       appearing mucosa.      Two sessile polyps were found in the descending colon and transverse       colon. The polyps were 3 to 8 mm in size. These polyps were removed with       a cold snare. Resection and retrieval were  complete. Estimated blood       loss: none.      Multiple large-mouthed diverticula were found in the entire colon.      Non-bleeding external and internal hemorrhoids were found during       retroflexion. The hemorrhoids were large. Impression:            - Patent end-to-side ileo-colonic anastomosis,                         characterized by healthy appearing mucosa.                        - Two 3 to 8 mm polyps in the descending colon and in                         the transverse colon, removed with a cold snare.                         Resected and retrieved.                        - Diverticulosis in the entire examined colon.                        - Non-bleeding external and internal hemorrhoids. Recommendation:        - Discharge patient to home (with escort).                        - Resume previous diet today.                         - Continue present medications.                        - Await pathology results.                        - Repeat colonoscopy in 5 years for surveillance. Procedure Code(s):     --- Professional ---                        [phone], Colonoscopy, flexible; with removal of                         tumor(s), polyp(s), or other lesion(s) by snare                         technique Diagnosis Code(s):     --- Professional ---                        Z86.010, Personal history of colonic polyps                        Z85.038, Personal history of other malignant neoplasm                         of large intestine                        Z98.0, Intestinal bypass and anastomosis status  D12.4, Benign neoplasm of descending colon                        D12.3, Benign neoplasm of transverse colon (hepatic                         flexure or splenic flexure)                        K64.8, Other hemorrhoids                        K57.30, Diverticulosis of large intestine without                         perforation or abscess without bleeding CPT copyright 2022 [employer]. All rights reserved. The codes documented in this report are preliminary and upon coder review may  be revised to meet current compliance requirements. Dr. Libby Maw Toney Reil MD, MD 07/03/2023 10:12:21 AM This report has been signed electronically. Number of Addenda: 0 Note Initiated On: 07/03/2023 9:20 AM Scope Withdrawal Time: 0 hours 16 minutes 56 seconds  Total Procedure Duration: 0 hours 26 minutes 3 seconds  Estimated Blood Loss:  Estimated blood loss: none.      [facility]

## 2023-07-03 NOTE — H&P (Signed)
 Arlyss Repress, MD [address]  [address]  [place], [place] [address]  Main: [phone]  Fax: [phone] Pager: [phone]  Primary Care Physician:  Sallyanne Kuster, NP Primary Gastroenterologist:  Dr. Arlyss Repress  Pre-Procedure History & Physical: HPI:  Tiffany Hawkins is a 78 y.o. female is here for an colonoscopy.   Past Medical History:  Diagnosis Date   Anxiety    Atherosclerosis of aorta (HCC)    Colon cancer (HCC) 10/12/2014   Stage IIB; had chemo   Diabetes mellitus without complication (HCC)    Hyperlipidemia    Hypertension    Intractable episodic headache    Lateral epicondylitis    OSA on CPAP    OSA on CPAP    Personal history of chemotherapy    Polio    childhood   Skin cancer    Sqamous Cell, Lt Calf   Sleep apnea    CPAP   Uncontrolled diabetes mellitus with hyperglycemia [facility])     Past Surgical History:  Procedure Laterality Date   APPENDECTOMY     BACK SURGERY     CATARACT EXTRACTION W/PHACO Left 12/03/2017   Procedure: CATARACT EXTRACTION PHACO AND INTRAOCULAR LENS PLACEMENT (IOC) LEFT  DIABETIC;  Surgeon: Nevada Crane, MD;  Location: [facility];  Service: Ophthalmology;  Laterality: Left;  Diabetic - insulin   CATARACT EXTRACTION W/PHACO Right 01/14/2018   Procedure: CATARACT EXTRACTION PHACO AND INTRAOCULAR LENS PLACEMENT (IOC) RIGHT DIABETIC;  Surgeon: Nevada Crane, MD;  Location: [facility];  Service: Ophthalmology;  Laterality: Right;  Diabetic - insulin   CERVICAL DISC SURGERY     CHOLECYSTECTOMY     COLON SURGERY     COLONOSCOPY     COLONOSCOPY WITH PROPOFOL N/A 05/17/2015   Procedure: COLONOSCOPY WITH PROPOFOL;  Surgeon: Scot Jun, MD;  Location: [facility] ENDOSCOPY;  Service: Endoscopy;  Laterality: N/A;   COLONOSCOPY WITH PROPOFOL N/A 04/01/2018   Procedure: COLONOSCOPY WITH BIOPSY;  Surgeon: Midge Minium, MD;  Location: [facility];  Service: Endoscopy;  Laterality: N/A;  Diabetic  - insulin   EMBOLIZATION (CATH LAB) N/A 01/24/2021   Procedure: EMBOLIZATION;  Surgeon: Annice Needy, MD;  Location: [facility] INVASIVE CV LAB;  Service: Cardiovascular;  Laterality: N/A;   ESOPHAGOGASTRODUODENOSCOPY     HERNIA REPAIR     LOWER EXTREMITY VENOGRAPHY Left 01/24/2021   Procedure: LOWER EXTREMITY VENOGRAPHY;  Surgeon: Annice Needy, MD;  Location: [facility] INVASIVE CV LAB;  Service: Cardiovascular;  Laterality: Left;   POLYPECTOMY N/A 04/01/2018   Procedure: POLYPECTOMY;  Surgeon: Midge Minium, MD;  Location: [facility];  Service: Endoscopy;  Laterality: N/A;   PORT A CATH INJECTION ([facility] HX)     Port has been removed   TUBAL LIGATION      Prior to Admission medications   Medication Sig Start Date End Date Taking? Authorizing Provider  amLODipine (NORVASC) 5 MG tablet TAKE 2 TABLETS EVERY DAY 10/04/22  Yes Abernathy, Alyssa, NP  benzonatate (TESSALON) 200 MG capsule Take 1 capsule (200 mg total) by mouth 3 (three) times daily as needed for cough. 06/03/23  Yes Domenick Gong, MD  cetirizine (ZYRTEC) 10 MG chewable tablet Chew 1 tablet (10 mg total) by mouth daily. Patient taking differently: Chew 10 mg by mouth daily as needed. 09/12/19  Yes Boscia, Kathlynn Grate, NP  chlorpheniramine-HYDROcodone (TUSSIONEX) 10-8 MG/5ML Take 5 mLs by mouth every 12 (twelve) hours as needed for cough. 06/05/23  Yes Sallyanne Kuster, NP  citalopram (CELEXA) 40 MG tablet TAKE 1 TABLET EVERY DAY 04/23/23  Yes Abernathy, Alyssa, NP  cyclobenzaprine (FLEXERIL) 5 MG tablet Take 1 tablet (5 mg total) by mouth 3 (three) times daily as needed for muscle spasms. 06/05/23  Yes Abernathy, Arlyss Repress, NP  dapagliflozin propanediol (FARXIGA) 10 MG TABS tablet Take 1 tablet (10 mg total) by mouth daily. 05/17/22  Yes Abernathy, Arlyss Repress, NP  fenofibrate 54 MG tablet TAKE 1 TABLET EVERY DAY 02/28/23  Yes Abernathy, Alyssa, NP  fluticasone (FLONASE) 50 MCG/ACT nasal spray Place 2 sprays into both nostrils daily. 03/08/22  Yes  Abernathy, Alyssa, NP  insulin aspart protamine - aspart (NOVOLOG MIX 70/30 FLEXPEN) (70-30) 100 UNIT/ML FlexPen Inject 35 units [place] QAM and inject 40units [place] QPM 11/09/21  Yes Abernathy, Alyssa, NP  lovastatin (MEVACOR) 20 MG tablet TAKE 1 TABLET AT BEDTIME FOR HIGH CHOLESTEROL 04/09/23  Yes Abernathy, Alyssa, NP  Multiple Vitamin (MULTIVITAMIN) tablet Take by mouth daily. TAKES 1/2 TABLET   Yes [provider]  ondansetron (ZOFRAN) 4 MG tablet Take 1 tablet (4 mg total) by mouth every 8 (eight) hours as needed for nausea or vomiting. 06/26/23  Yes Lonita Debes, Loel Dubonnet, MD  pantoprazole (PROTONIX) 20 MG tablet TAKE 2 TABLETS EVERY DAY 10/02/22  Yes Abernathy, Arlyss Repress, NP  tirzepatide [facility]) 2.5 MG/0.5ML Pen Inject 2.5 mg into the skin once a week. Patient taking differently: Inject 2.5 mg into the skin once a week. Thursday 03/28/23  Yes Abernathy, Arlyss Repress, NP  albuterol (VENTOLIN HFA) 108 (90 Base) MCG/ACT inhaler Inhale 2 puffs into the lungs every 6 (six) hours as needed for wheezing or shortness of breath. Patient not taking: Reported on 06/26/2023 07/08/20   Carlean Jews, PA-C  Blood Glucose Monitoring Suppl (TRUE METRIX METER) w/Device KIT USE AS DIRECTED 09/24/20   Lyndon Code, MD  sharps container 1 each by Does not apply route as needed. To use for insulin syringes and needles. Taking insulin at least twice daily.  E11.65 11/06/17   Carlean Jews, NP  spironolactone (ALDACTONE) 100 MG tablet Take 100 mg by mouth daily. 2 tablets once a day Patient not taking: Reported on 06/26/2023    [provider]  TRUE METRIX BLOOD GLUCOSE TEST test strip TEST BLOOD SUGAR THREE TIMES DAILY AFTER MEALS 10/10/22   Sallyanne Kuster, NP  TRUEplus Lancets 33G MISC TEST BLOOD SUGAR THREE TIMES DAILY AS DIRECTED 05/16/23   Sallyanne Kuster, NP    Allergies as of 06/26/2023 - Review Complete 06/26/2023  Allergen Reaction Noted   Lisinopril Cough 06/22/2020    Family History   Problem Relation Age of Onset   Alzheimer's disease Mother    Heart disease Father    Colon cancer Cousin    Colon cancer Cousin    Cancer Cousin        Brain Tumor   Breast cancer Neg Hx     Social History   Socioeconomic History   Marital status: Married    Spouse name: Not on file   Number of children: Not on file   Years of education: Not on file   Highest education level: Not on file  Occupational History   Not on file  Tobacco Use   Smoking status: Former    Current packs/day: 0.00    Average packs/day: 1.5 packs/day for 20.0 years (30.0 ttl pk-yrs)    Types: Cigarettes    Start date: 04/17/1961    Quit date: 04/17/1981    Years since quitting: 42.2  Smokeless tobacco: Never  Vaping Use   Vaping status: Never Used  Substance and Sexual Activity   Alcohol use: No   Drug use: No   Sexual activity: Yes  Other Topics Concern   Not on file  Social History Narrative   Not on file   Social Drivers of Health   Financial Resource Strain: Low Risk  (06/22/2021)   Overall Financial Resource Strain (CARDIA)    Difficulty of Paying Living Expenses: Not hard at all  Food Insecurity: Not on file  Transportation Needs: No Transportation Needs (06/22/2021)   PRAPARE - Administrator, Civil Service (Medical): No    Lack of Transportation (Non-Medical): No  Physical Activity: Not on file  Stress: Not on file  Social Connections: Not on file  Intimate Partner Violence: Not on file    Review of Systems: See HPI, otherwise negative ROS  Physical Exam: BP (!) 150/81   Pulse 91   Temp 98.4 F (36.9 C) (Temporal)   Resp 10   Ht 5' 1.5" (1.562 m)   Wt 67.1 kg   SpO2 96%   BMI 27.51 kg/m  General:   Alert,  pleasant and cooperative in NAD Head:  Normocephalic and atraumatic. Neck:  Supple; no masses or thyromegaly. Lungs:  Clear throughout to auscultation.    Heart:  Regular rate and rhythm. Abdomen:  Soft, nontender and nondistended. Normal bowel sounds,  without guarding, and without rebound.   Neurologic:  Alert and  oriented x4;  grossly normal neurologically.  Impression/Plan: Tiffany Hawkins is here for an colonoscopy to be performed for h/o colon cancer  Risks, benefits, limitations, and alternatives regarding  colonoscopy have been reviewed with the patient.  Questions have been answered.  All parties agreeable.   Lannette Donath, MD  07/03/2023, 9:23 AM

## 2023-07-03 NOTE — Transfer of Care (Signed)
 Immediate Anesthesia Transfer of Care Note  Patient: Tiffany Hawkins  Procedure(s) Performed: COLONOSCOPY POLYPECTOMY (Rectum)  Patient Location: PACU  Anesthesia Type: General  Level of Consciousness: awake, alert  and patient cooperative  Airway and Oxygen Therapy: Patient Spontanous Breathing and Patient connected to supplemental oxygen  Post-op Assessment: Post-op Vital signs reviewed, Patient's Cardiovascular Status Stable, Respiratory Function Stable, Patent Airway and No signs of Nausea or vomiting  Post-op Vital Signs: Reviewed and stable  Complications: No notable events documented.

## 2023-07-03 NOTE — Anesthesia Preprocedure Evaluation (Signed)
 Anesthesia Evaluation  Patient identified by MRN, date of birth, ID band Patient awake    Reviewed: Allergy & Precautions, H&P , NPO status , Patient's Chart, lab work & pertinent test results  Airway Mallampati: III  TM Distance: >3 FB Neck ROM: Full    Dental no notable dental hx. (+) Caps Has a cap left lower jaw:   Pulmonary neg pulmonary ROS, former smoker   Pulmonary exam normal breath sounds clear to auscultation       Cardiovascular hypertension, negative cardio ROS Normal cardiovascular exam Rhythm:Regular Rate:Normal     Neuro/Psych negative neurological ROS  negative psych ROS   GI/Hepatic negative GI ROS, Neg liver ROS,,,  Endo/Other  negative endocrine ROSdiabetes    Renal/GU negative Renal ROS  negative genitourinary   Musculoskeletal negative musculoskeletal ROS (+)    Abdominal   Peds negative pediatric ROS (+)  Hematology negative hematology ROS (+)   Anesthesia Other Findings Hypertension  Diabetes mellitus without complication  Hyperlipidemia  Lateral epicondylitis Polio  Anxiety Personal history of chemotherapy Sleep apnea Skin cancer  Colon cancer  OSA on CPAP Atherosclerosis of aorta  Uncontrolled diabetes mellitus with hyperglycemia  Intractable episodic headache OSA on CPAP     Reproductive/Obstetrics negative OB ROS                             Anesthesia Physical Anesthesia Plan  ASA: 3  Anesthesia Plan: General   Post-op Pain Management:    Induction: Intravenous  PONV Risk Score and Plan:   Airway Management Planned: Natural Airway and Nasal Cannula  Additional Equipment:   Intra-op Plan:   Post-operative Plan:   Informed Consent: I have reviewed the patients History and Physical, chart, labs and discussed the procedure including the risks, benefits and alternatives for the proposed anesthesia with the patient or authorized  representative who has indicated his/her understanding and acceptance.     Dental Advisory Given  Plan Discussed with: Anesthesiologist, CRNA and Surgeon  Anesthesia Plan Comments: (Patient consented for risks of anesthesia including but not limited to:  - adverse reactions to medications - risk of airway placement if required - damage to eyes, teeth, lips or other oral mucosa - nerve damage due to positioning  - sore throat or hoarseness - Damage to heart, brain, nerves, lungs, other parts of body or loss of life  Patient voiced understanding and assent.)       Anesthesia Quick Evaluation

## 2023-07-04 ENCOUNTER — Encounter: Payer: Self-pay | Admitting: Gastroenterology

## 2023-07-05 ENCOUNTER — Encounter: Payer: Self-pay | Admitting: Gastroenterology

## 2023-07-05 LAB — SURGICAL PATHOLOGY

## 2023-07-10 ENCOUNTER — Other Ambulatory Visit: Payer: Self-pay

## 2023-07-10 DIAGNOSIS — Z794 Long term (current) use of insulin: Secondary | ICD-10-CM

## 2023-07-10 DIAGNOSIS — H26492 Other secondary cataract, left eye: Secondary | ICD-10-CM | POA: Diagnosis not present

## 2023-07-10 MED ORDER — TIRZEPATIDE 2.5 MG/0.5ML ~~LOC~~ SOAJ: 2.5000 mg | SUBCUTANEOUS | 1 refills | Status: DC

## 2023-07-11 ENCOUNTER — Other Ambulatory Visit: Payer: Self-pay

## 2023-07-11 MED ORDER — TRUE METRIX BLOOD GLUCOSE TEST VI STRP: ORAL_STRIP | 3 refills | Status: AC

## 2023-07-15 ENCOUNTER — Encounter: Payer: Self-pay | Admitting: Nurse Practitioner

## 2023-07-19 ENCOUNTER — Other Ambulatory Visit: Payer: Self-pay

## 2023-07-19 MED ORDER — TRUE METRIX METER W/DEVICE KIT: PACK | 0 refills | Status: AC

## 2023-07-30 ENCOUNTER — Other Ambulatory Visit: Payer: Self-pay | Admitting: Nurse Practitioner

## 2023-07-30 ENCOUNTER — Encounter: Payer: Self-pay | Admitting: Nurse Practitioner

## 2023-07-30 ENCOUNTER — Ambulatory Visit: Payer: Medicare HMO | Admitting: Nurse Practitioner

## 2023-07-30 VITALS — BP 136/72 | HR 92 | Temp 98.4°F | Resp 16 | Ht 61.5 in | Wt 152.6 lb

## 2023-07-30 DIAGNOSIS — I152 Hypertension secondary to endocrine disorders: Secondary | ICD-10-CM

## 2023-07-30 DIAGNOSIS — N1832 Chronic kidney disease, stage 3b: Secondary | ICD-10-CM | POA: Insufficient documentation

## 2023-07-30 DIAGNOSIS — Z760 Encounter for issue of repeat prescription: Secondary | ICD-10-CM

## 2023-07-30 DIAGNOSIS — E1169 Type 2 diabetes mellitus with other specified complication: Secondary | ICD-10-CM

## 2023-07-30 DIAGNOSIS — E1159 Type 2 diabetes mellitus with other circulatory complications: Secondary | ICD-10-CM | POA: Diagnosis not present

## 2023-07-30 DIAGNOSIS — Z794 Long term (current) use of insulin: Secondary | ICD-10-CM

## 2023-07-30 DIAGNOSIS — I10 Essential (primary) hypertension: Secondary | ICD-10-CM

## 2023-07-30 DIAGNOSIS — E1122 Type 2 diabetes mellitus with diabetic chronic kidney disease: Secondary | ICD-10-CM

## 2023-07-30 DIAGNOSIS — E785 Hyperlipidemia, unspecified: Secondary | ICD-10-CM | POA: Diagnosis not present

## 2023-07-30 LAB — POCT GLYCOSYLATED HEMOGLOBIN (HGB A1C): Hemoglobin A1C: 6.1 % — AB (ref 4.0–5.6)

## 2023-07-30 MED ORDER — OLMESARTAN MEDOXOMIL 5 MG PO TABS: 5.0000 mg | ORAL_TABLET | Freq: Every day | ORAL | 3 refills | Status: DC

## 2023-07-30 MED ORDER — LOVASTATIN 20 MG PO TABS: 40.0000 mg | ORAL_TABLET | Freq: Every day | ORAL | Status: DC

## 2023-07-30 NOTE — Progress Notes (Unsigned)
 [facility] [address] [place], [place] [address]  Internal MEDICINE  Office Visit Note  Patient Name: Tiffany Hawkins  [id number]  [id number]  Date of Service: 07/30/2023  Chief Complaint  Patient presents with   Diabetes   Hypertension   Hyperlipidemia    HPI Tiffany Hawkins presents for a follow-up visit for diabetes, high cholesterol, CKD.  Diabetes -- A1c significantly improved to 6.1 from 7.6 in January this year.  High cholesterol -- triglycerides significantly elevated, high VLDL, low HDL, normal LDL CKD stage 3B -- eGFR is 43 -- not on ACEi or ARB.  Had a recent colonoscopy -- had 2 polyps removed.    Current Medication: Outpatient Encounter Medications as of 07/30/2023  Medication Sig   benzonatate (TESSALON) 200 MG capsule Take 1 capsule (200 mg total) by mouth 3 (three) times daily as needed for cough.   Blood Glucose Monitoring Suppl (TRUE METRIX METER) w/Device KIT Use as directed DxE11.65   cetirizine (ZYRTEC) 10 MG chewable tablet Chew 1 tablet (10 mg total) by mouth daily. (Patient taking differently: Chew 10 mg by mouth daily as needed.)   chlorpheniramine-HYDROcodone (TUSSIONEX) 10-8 MG/5ML Take 5 mLs by mouth every 12 (twelve) hours as needed for cough.   citalopram (CELEXA) 40 MG tablet TAKE 1 TABLET EVERY DAY   cyclobenzaprine (FLEXERIL) 5 MG tablet Take 1 tablet (5 mg total) by mouth 3 (three) times daily as needed for muscle spasms.   dapagliflozin propanediol (FARXIGA) 10 MG TABS tablet Take 1 tablet (10 mg total) by mouth daily.   fenofibrate 54 MG tablet TAKE 1 TABLET EVERY DAY   fluticasone (FLONASE) 50 MCG/ACT nasal spray Place 2 sprays into both nostrils daily.   glucose blood (TRUE METRIX BLOOD GLUCOSE TEST) test strip TEST BLOOD SUGAR THREE TIMES DAILY AFTER MEALS   insulin aspart protamine - aspart (NOVOLOG MIX 70/30 FLEXPEN) (70-30) 100 UNIT/ML FlexPen Inject 35 units [place] QAM and inject 40units [place] QPM   Multiple Vitamin (MULTIVITAMIN) tablet  Take by mouth daily. TAKES 1/2 TABLET   olmesartan (BENICAR) 5 MG tablet Take 1 tablet (5 mg total) by mouth daily.   ondansetron (ZOFRAN) 4 MG tablet Take 1 tablet (4 mg total) by mouth every 8 (eight) hours as needed for nausea or vomiting.   pantoprazole (PROTONIX) 20 MG tablet TAKE 2 TABLETS EVERY DAY   sharps container 1 each by Does not apply route as needed. To use for insulin syringes and needles. Taking insulin at least twice daily.  E11.65   spironolactone (ALDACTONE) 100 MG tablet Take 100 mg by mouth daily. 2 tablets once a day   tirzepatide [facility]) 2.5 MG/0.5ML Pen Inject 2.5 mg into the skin once a week.   TRUEplus Lancets 33G MISC TEST BLOOD SUGAR THREE TIMES DAILY AS DIRECTED   [DISCONTINUED] amLODipine (NORVASC) 5 MG tablet TAKE 2 TABLETS EVERY DAY   [DISCONTINUED] lovastatin (MEVACOR) 20 MG tablet TAKE 1 TABLET AT BEDTIME FOR HIGH CHOLESTEROL   lovastatin (MEVACOR) 20 MG tablet Take 2 tablets (40 mg total) by mouth at bedtime. For high cholesterol   No facility-administered encounter medications on file as of 07/30/2023.    Surgical History: Past Surgical History:  Procedure Laterality Date   APPENDECTOMY     BACK SURGERY     CATARACT EXTRACTION W/PHACO Left 12/03/2017   Procedure: CATARACT EXTRACTION PHACO AND INTRAOCULAR LENS PLACEMENT (IOC) LEFT  DIABETIC;  Surgeon: Nevada Crane, MD;  Location: [facility];  Service: Ophthalmology;  Laterality: Left;  Diabetic - insulin   CATARACT EXTRACTION W/PHACO Right 01/14/2018   Procedure: CATARACT EXTRACTION PHACO AND INTRAOCULAR LENS PLACEMENT (IOC) RIGHT DIABETIC;  Surgeon: Rosa College, MD;  Location: [facility];  Service: Ophthalmology;  Laterality: Right;  Diabetic - insulin   CERVICAL DISC SURGERY     CHOLECYSTECTOMY     COLON SURGERY     COLONOSCOPY     COLONOSCOPY N/A 07/03/2023   Procedure: COLONOSCOPY;  Surgeon: Selena Daily, MD;  Location: [facility];  Service: Endoscopy;   Laterality: N/A;  Diabetic   COLONOSCOPY WITH PROPOFOL N/A 05/17/2015   Procedure: COLONOSCOPY WITH PROPOFOL;  Surgeon: Cassie Click, MD;  Location: [facility] ENDOSCOPY;  Service: Endoscopy;  Laterality: N/A;   COLONOSCOPY WITH PROPOFOL N/A 04/01/2018   Procedure: COLONOSCOPY WITH BIOPSY;  Surgeon: Marnee Sink, MD;  Location: [facility];  Service: Endoscopy;  Laterality: N/A;  Diabetic - insulin   EMBOLIZATION (CATH LAB) N/A 01/24/2021   Procedure: EMBOLIZATION;  Surgeon: Celso College, MD;  Location: [facility] INVASIVE CV LAB;  Service: Cardiovascular;  Laterality: N/A;   ESOPHAGOGASTRODUODENOSCOPY     HERNIA REPAIR     LOWER EXTREMITY VENOGRAPHY Left 01/24/2021   Procedure: LOWER EXTREMITY VENOGRAPHY;  Surgeon: Celso College, MD;  Location: [facility] INVASIVE CV LAB;  Service: Cardiovascular;  Laterality: Left;   POLYPECTOMY N/A 04/01/2018   Procedure: POLYPECTOMY;  Surgeon: Marnee Sink, MD;  Location: [facility]ERY [facility];  Service: Endoscopy;  Laterality: N/A;   POLYPECTOMY  07/03/2023   Procedure: POLYPECTOMY;  Surgeon: Selena Daily, MD;  Location: [facility] SURGERY [facility];  Service: Endoscopy;;   PORT A CATH INJECTION ([facility] HX)     Port has been removed   TUBAL LIGATION      Medical History: Past Medical History:  Diagnosis Date   Anxiety    Atherosclerosis of aorta (HCC)    Colon cancer (HCC) 10/12/2014   Stage IIB; had chemo   Diabetes mellitus without complication (HCC)    Hyperlipidemia    Hypertension    Intractable episodic headache    Lateral epicondylitis    OSA on CPAP    OSA on CPAP    Personal history of chemotherapy    Polio    childhood   Skin cancer    Sqamous Cell, Lt Calf   Sleep apnea    CPAP   Uncontrolled diabetes mellitus with hyperglycemia (HCC)     Family History: Family History  Problem Relation Age of Onset   Alzheimer's disease Mother    Heart disease Father    Colon cancer Cousin    Colon cancer Cousin    Cancer Cousin        Brain  Tumor   Breast cancer Neg Hx     Social History   Socioeconomic History   Marital status: Married    Spouse name: Not on file   Number of children: Not on file   Years of education: Not on file   Highest education level: Not on file  Occupational History   Not on file  Tobacco Use   Smoking status: Former    Current packs/day: 0.00    Average packs/day: 1.5 packs/day for 20.0 years (30.0 ttl pk-yrs)    Types: Cigarettes    Start date: 04/17/1961    Quit date: 04/17/1981    Years since quitting: 42.3   Smokeless tobacco: Never  Vaping Use   Vaping status: Never Used  Substance and Sexual Activity   Alcohol use:  No   Drug use: No   Sexual activity: Yes  Other Topics Concern   Not on file  Social History Narrative   Not on file   Social Drivers of Health   Financial Resource Strain: Low Risk  (06/22/2021)   Overall Financial Resource Strain (CARDIA)    Difficulty of Paying Living Expenses: Not hard at all  Food Insecurity: Not on file  Transportation Needs: No Transportation Needs (06/22/2021)   PRAPARE - Administrator, Civil Service (Medical): No    Lack of Transportation (Non-Medical): No  Physical Activity: Not on file  Stress: Not on file  Social Connections: Not on file  Intimate Partner Violence: Not on file      Review of Systems  Constitutional:  Negative for chills, fatigue and unexpected weight change.  HENT:  Negative for congestion, postnasal drip, rhinorrhea, sneezing and sore throat.   Eyes:  Negative for redness.  Respiratory: Negative.  Negative for cough, chest tightness, shortness of breath and wheezing.   Cardiovascular: Negative.  Negative for chest pain and palpitations.  Gastrointestinal: Negative.  Negative for abdominal pain, constipation, diarrhea, nausea and vomiting.  Genitourinary:  Negative for dysuria and frequency.  Musculoskeletal:  Negative for arthralgias, back pain, joint swelling and neck pain.  Skin:  Negative for  rash.  Neurological:  Negative for tremors and numbness.  Hematological:  Negative for adenopathy. Does not bruise/bleed easily.  Psychiatric/Behavioral:  Negative for behavioral problems (Depression), sleep disturbance and suicidal ideas. The patient is not nervous/anxious.     Vital Signs: BP 136/72   Pulse 92   Temp 98.4 F (36.9 C)   Resp 16   Ht 5' 1.5" (1.562 m)   Wt 152 lb 9.6 oz (69.2 kg)   SpO2 96%   BMI 28.37 kg/m    Physical Exam Vitals reviewed.  Constitutional:      General: She is not in acute distress.    Appearance: Normal appearance. She is normal weight. She is not ill-appearing.  HENT:     Head: Normocephalic and atraumatic.  Eyes:     Pupils: Pupils are equal, round, and reactive to light.  Cardiovascular:     Rate and Rhythm: Normal rate and regular rhythm.  Pulmonary:     Effort: Pulmonary effort is normal. No respiratory distress.  Neurological:     Mental Status: She is alert and oriented to person, place, and time.     Cranial Nerves: No cranial nerve deficit.     Coordination: Coordination normal.     Gait: Gait normal.  Psychiatric:        Mood and Affect: Mood normal.        Behavior: Behavior normal.        Assessment/Plan: 1. Type 2 diabetes mellitus with stage 3b chronic kidney disease, with long-term current use of insulin (HCC) (Primary) A1c is significantly improved and stable at 6.1. continue mounjaro and insulin as prescribed.  - POCT glycosylated hemoglobin (Hb A1C)  2. Stage 3b chronic kidney disease (HCC) Discontinue amlodipine, start olmesartan as prescribed.  - olmesartan (BENICAR) 5 MG tablet; Take 1 tablet (5 mg total) by mouth daily.  Dispense: 30 tablet; Refill: 3  3. Hypertension associated with type 2 diabetes mellitus (HCC) Discontinue amlodipine, start olmesartan as prescribed.   4. Hyperlipidemia associated with type 2 diabetes mellitus (HCC) Increase lovastatin dose to 40 mg daily.  - lovastatin (MEVACOR)  20 MG tablet; Take 2 tablets (40 mg total) by mouth at bedtime.  For high cholesterol   General Counseling: Tiffany Hawkins understanding of the findings of todays visit and agrees with plan of treatment. I have discussed any further diagnostic evaluation that may be needed or ordered today. We also reviewed her medications today. she has been encouraged to call the office with any questions or concerns that should arise related to todays visit.    Orders Placed This Encounter  Procedures   POCT glycosylated hemoglobin (Hb A1C)    Meds ordered this encounter  Medications   lovastatin (MEVACOR) 20 MG tablet    Sig: Take 2 tablets (40 mg total) by mouth at bedtime. For high cholesterol   olmesartan (BENICAR) 5 MG tablet    Sig: Take 1 tablet (5 mg total) by mouth daily.    Dispense:  30 tablet    Refill:  3    Discontinue amlodipine, fill new script today    Return in about 4 weeks (around 08/27/2023) for F/U, eval new med, Lynnea Vandervoort PCP olmesartan .   Total time spent:30 Minutes Time spent includes review of chart, medications, test results, and follow up plan with the patient.   [place] Controlled Substance Database was reviewed by me.  This patient was seen by Laurence Pons, FNP-C in collaboration with Dr. Verneta Gone as a part of collaborative care agreement.   Keymiah Lyles R. Bobbi Burow, MSN, FNP-C Internal medicine

## 2023-07-30 NOTE — Telephone Encounter (Signed)
 Please review

## 2023-08-29 ENCOUNTER — Encounter: Payer: Self-pay | Admitting: Nurse Practitioner

## 2023-08-29 ENCOUNTER — Ambulatory Visit (INDEPENDENT_AMBULATORY_CARE_PROVIDER_SITE_OTHER): Admitting: Nurse Practitioner

## 2023-08-29 VITALS — BP 138/82 | HR 99 | Temp 98.3°F | Resp 16 | Ht 61.5 in | Wt 151.2 lb

## 2023-08-29 DIAGNOSIS — N3946 Mixed incontinence: Secondary | ICD-10-CM | POA: Insufficient documentation

## 2023-08-29 DIAGNOSIS — Z794 Long term (current) use of insulin: Secondary | ICD-10-CM | POA: Diagnosis not present

## 2023-08-29 DIAGNOSIS — I152 Hypertension secondary to endocrine disorders: Secondary | ICD-10-CM

## 2023-08-29 DIAGNOSIS — E1159 Type 2 diabetes mellitus with other circulatory complications: Secondary | ICD-10-CM | POA: Diagnosis not present

## 2023-08-29 DIAGNOSIS — N1832 Chronic kidney disease, stage 3b: Secondary | ICD-10-CM | POA: Diagnosis not present

## 2023-08-29 DIAGNOSIS — E1122 Type 2 diabetes mellitus with diabetic chronic kidney disease: Secondary | ICD-10-CM | POA: Diagnosis not present

## 2023-08-29 MED ORDER — DAPAGLIFLOZIN PROPANEDIOL 10 MG PO TABS: 10.0000 mg | ORAL_TABLET | Freq: Every day | ORAL | Status: AC

## 2023-08-29 MED ORDER — OLMESARTAN MEDOXOMIL 5 MG PO TABS: 5.0000 mg | ORAL_TABLET | Freq: Every day | ORAL | 3 refills | Status: DC

## 2023-08-29 NOTE — Patient Instructions (Signed)
 STOP amlodipine   START olmesartan 

## 2023-08-29 NOTE — Progress Notes (Signed)
 [facility] [address] [place], [place] [address]  Internal MEDICINE  Office Visit Note  Patient Name: Tiffany Hawkins  [id number]  [id number]  Date of Service: 08/29/2023  Chief Complaint  Patient presents with  . Diabetes  . Hypertension  . Hyperlipidemia  . Follow-up    HPI Nichole presents for a follow-up visit for hypertension, high cholesterol and urinary incontinence.  Hypertension -- has not started olmesartan  yet but will pick it up from pharmacy today High cholesterol -- started increased dose of lovastatin  and is tolerating it well. Urinary incontinence -- not taking anything to help with this. Not currently taking spironolactone or any other diuretics.     Current Medication: Outpatient Encounter Medications as of 08/29/2023  Medication Sig  . Blood Glucose Monitoring Suppl (TRUE METRIX METER) w/Device KIT Use as directed DxE11.65  . cetirizine  (ZYRTEC ) 10 MG chewable tablet Chew 1 tablet (10 mg total) by mouth daily. (Patient taking differently: Chew 10 mg by mouth daily as needed.)  . chlorpheniramine -HYDROcodone  (TUSSIONEX) 10-8 MG/5ML Take 5 mLs by mouth every 12 (twelve) hours as needed for cough.  . citalopram  (CELEXA ) 40 MG tablet TAKE 1 TABLET EVERY DAY  . cyclobenzaprine  (FLEXERIL ) 5 MG tablet Take 1 tablet (5 mg total) by mouth 3 (three) times daily as needed for muscle spasms.  . fenofibrate  54 MG tablet TAKE 1 TABLET EVERY DAY  . fluticasone  (FLONASE ) 50 MCG/ACT nasal spray Place 2 sprays into both nostrils daily.  Aaron Aas glucose blood (TRUE METRIX BLOOD GLUCOSE TEST) test strip TEST BLOOD SUGAR THREE TIMES DAILY AFTER MEALS  . insulin  aspart protamine - aspart (NOVOLOG  MIX 70/30 FLEXPEN) (70-30) 100 UNIT/ML FlexPen Inject 35 units [place] QAM and inject 40units [place] QPM  . lovastatin  (MEVACOR ) 20 MG tablet Take 2 tablets (40 mg total) by mouth at bedtime. For high cholesterol  . Multiple Vitamin (MULTIVITAMIN) tablet Take by mouth daily. TAKES 1/2  TABLET  . ondansetron  (ZOFRAN ) 4 MG tablet Take 1 tablet (4 mg total) by mouth every 8 (eight) hours as needed for nausea or vomiting.  . pantoprazole  (PROTONIX ) 20 MG tablet TAKE 2 TABLETS EVERY DAY  . sharps container 1 each by Does not apply route as needed. To use for insulin  syringes and needles. Taking insulin  at least twice daily.  E11.65  . tirzepatide  (MOUNJARO ) 2.5 MG/0.5ML Pen Inject 2.5 mg into the skin once a week.  . TRUEplus Lancets 33G MISC TEST BLOOD SUGAR THREE TIMES DAILY AS DIRECTED  . [DISCONTINUED] benzonatate  (TESSALON ) 200 MG capsule Take 1 capsule (200 mg total) by mouth 3 (three) times daily as needed for cough.  . [DISCONTINUED] dapagliflozin  propanediol (FARXIGA ) 10 MG TABS tablet Take 1 tablet (10 mg total) by mouth daily.  . [DISCONTINUED] olmesartan  (BENICAR ) 5 MG tablet Take 1 tablet (5 mg total) by mouth daily.  . dapagliflozin  propanediol (FARXIGA ) 10 MG TABS tablet Take 1 tablet (10 mg total) by mouth daily.  . olmesartan  (BENICAR ) 5 MG tablet Take 1 tablet (5 mg total) by mouth daily.  Aaron Aas spironolactone (ALDACTONE) 100 MG tablet Take 100 mg by mouth daily. 2 tablets once a day (Patient not taking: Reported on 08/29/2023)   No facility-administered encounter medications on file as of 08/29/2023.    Surgical History: Past Surgical History:  Procedure Laterality Date  . APPENDECTOMY    . BACK SURGERY    . CATARACT EXTRACTION W/PHACO Left 12/03/2017   Procedure: CATARACT EXTRACTION PHACO AND INTRAOCULAR LENS PLACEMENT (IOC) LEFT  DIABETIC;  Surgeon: Rosa College, MD;  Location: [facility];  Service: Ophthalmology;  Laterality: Left;  Diabetic - insulin   . CATARACT EXTRACTION W/PHACO Right 01/14/2018   Procedure: CATARACT EXTRACTION PHACO AND INTRAOCULAR LENS PLACEMENT (IOC) RIGHT DIABETIC;  Surgeon: Rosa College, MD;  Location: [facility];  Service: Ophthalmology;  Laterality: Right;  Diabetic - insulin   . CERVICAL DISC SURGERY    .  CHOLECYSTECTOMY    . COLON SURGERY    . COLONOSCOPY    . COLONOSCOPY N/A 07/03/2023   Procedure: COLONOSCOPY;  Surgeon: Selena Daily, MD;  Location: [facility];  Service: Endoscopy;  Laterality: N/A;  Diabetic  . COLONOSCOPY WITH PROPOFOL  N/A 05/17/2015   Procedure: COLONOSCOPY WITH PROPOFOL ;  Surgeon: Cassie Click, MD;  Location: [facility];  Service: Endoscopy;  Laterality: N/A;  . COLONOSCOPY WITH PROPOFOL  N/A 04/01/2018   Procedure: COLONOSCOPY WITH BIOPSY;  Surgeon: Marnee Sink, MD;  Location: [facility];  Service: Endoscopy;  Laterality: N/A;  Diabetic - insulin   . EMBOLIZATION (CATH LAB) N/A 01/24/2021   Procedure: EMBOLIZATION;  Surgeon: Celso College, MD;  Location: [facility] INVASIVE CV LAB;  Service: Cardiovascular;  Laterality: N/A;  . ESOPHAGOGASTRODUODENOSCOPY    . HERNIA REPAIR    . LOWER EXTREMITY VENOGRAPHY Left 01/24/2021   Procedure: LOWER EXTREMITY VENOGRAPHY;  Surgeon: Celso College, MD;  Location: [facility] INVASIVE CV LAB;  Service: Cardiovascular;  Laterality: Left;  . POLYPECTOMY N/A 04/01/2018   Procedure: POLYPECTOMY;  Surgeon: Marnee Sink, MD;  Location: [facility];  Service: Endoscopy;  Laterality: N/A;  . POLYPECTOMY  07/03/2023   Procedure: POLYPECTOMY;  Surgeon: Selena Daily, MD;  Location: [facility];  Service: Endoscopy;;  . PORT A CATH INJECTION ([facility] HX)     Port has been removed  . TUBAL LIGATION      Medical History: Past Medical History:  Diagnosis Date  . Anxiety   . Atherosclerosis of aorta (HCC)   . Colon cancer (HCC) 10/12/2014   Stage IIB; had chemo  . Diabetes mellitus without complication (HCC)   . Hyperlipidemia   . Hypertension   . Intractable episodic headache   . Lateral epicondylitis   . OSA on CPAP   . OSA on CPAP   . Personal history of chemotherapy   . Polio    childhood  . Skin cancer    Sqamous Cell, Lt Calf  . Sleep apnea    CPAP  . Uncontrolled diabetes mellitus with  hyperglycemia (HCC)     Family History: Family History  Problem Relation Age of Onset  . Alzheimer's disease Mother   . Heart disease Father   . Colon cancer Cousin   . Colon cancer Cousin   . Cancer Cousin        Brain Tumor  . Breast cancer Neg Hx     Social History   Socioeconomic History  . Marital status: Married    Spouse name: Not on file  . Number of children: Not on file  . Years of education: Not on file  . Highest education level: Not on file  Occupational History  . Not on file  Tobacco Use  . Smoking status: Former    Current packs/day: 0.00    Average packs/day: 1.5 packs/day for 20.0 years (30.0 ttl pk-yrs)    Types: Cigarettes    Start date: 04/17/1961    Quit date: 04/17/1981    Years since quitting: 42.3  . Smokeless tobacco: Never  Vaping Use  . Vaping status: Never Used  Substance and Sexual Activity  . Alcohol use: No  . Drug use: No  . Sexual activity: Yes  Other Topics Concern  . Not on file  Social History Narrative  . Not on file   Social Drivers of Health   Financial Resource Strain: Low Risk  (06/22/2021)   Overall Financial Resource Strain (CARDIA)   . Difficulty of Paying Living Expenses: Not hard at all  Food Insecurity: Not on file  Transportation Needs: No Transportation Needs (06/22/2021)   PRAPARE - Transportation   . Lack of Transportation (Medical): No   . Lack of Transportation (Non-Medical): No  Physical Activity: Not on file  Stress: Not on file  Social Connections: Not on file  Intimate Partner Violence: Not on file      Review of Systems  Constitutional:  Negative for chills, fatigue and unexpected weight change.  HENT:  Negative for congestion, postnasal drip, rhinorrhea, sneezing and sore throat.   Eyes:  Negative for redness.  Respiratory: Negative.  Negative for cough, chest tightness, shortness of breath and wheezing.   Cardiovascular: Negative.  Negative for chest pain and palpitations.  Gastrointestinal:  Negative.  Negative for abdominal pain, constipation, diarrhea, nausea and vomiting.  Genitourinary:  Negative for dysuria and frequency.  Musculoskeletal:  Negative for arthralgias, back pain, joint swelling and neck pain.  Skin:  Negative for rash.  Neurological:  Negative for tremors and numbness.  Hematological:  Negative for adenopathy. Does not bruise/bleed easily.  Psychiatric/Behavioral:  Negative for behavioral problems (Depression), sleep disturbance and suicidal ideas. The patient is not nervous/anxious.     Vital Signs: BP (!) 140/82   Pulse 99   Temp 98.3 F (36.8 C)   Resp 16   Ht 5' 1.5" (1.562 m)   Wt 151 lb 3.2 oz (68.6 kg)   SpO2 95%   BMI 28.11 kg/m    Physical Exam Vitals reviewed.  Constitutional:      General: She is not in acute distress.    Appearance: Normal appearance. She is normal weight. She is not ill-appearing.  HENT:     Head: Normocephalic and atraumatic.  Eyes:     Pupils: Pupils are equal, round, and reactive to light.  Cardiovascular:     Rate and Rhythm: Normal rate and regular rhythm.  Pulmonary:     Effort: Pulmonary effort is normal. No respiratory distress.  Neurological:     Mental Status: She is alert and oriented to person, place, and time.     Cranial Nerves: No cranial nerve deficit.     Coordination: Coordination normal.     Gait: Gait normal.  Psychiatric:        Mood and Affect: Mood normal.        Behavior: Behavior normal.       Assessment/Plan: 1. Type 2 diabetes mellitus with stage 3b chronic kidney disease, with long-term current use of insulin  (HCC) (Primary) *** - Urine Microalbumin w/creat. ratio - dapagliflozin  propanediol (FARXIGA ) 10 MG TABS tablet; Take 1 tablet (10 mg total) by mouth daily.  2. Stage 3b chronic kidney disease (HCC) *** - olmesartan  (BENICAR ) 5 MG tablet; Take 1 tablet (5 mg total) by mouth daily.  Dispense: 30 tablet; Refill: 3  3. Hypertension associated with type 2 diabetes  mellitus (HCC) *** - olmesartan  (BENICAR ) 5 MG tablet; Take 1 tablet (5 mg total) by mouth daily.  Dispense: 30 tablet; Refill: 3  4. Mixed stress and urge  urinary incontinence ***   General Counseling: kayl eriksen understanding of the findings of todays visit and agrees with plan of treatment. I have discussed any further diagnostic evaluation that may be needed or ordered today. We also reviewed her medications today. she has been encouraged to call the office with any questions or concerns that should arise related to todays visit.    Orders Placed This Encounter  Procedures  . Urine Microalbumin w/creat. ratio    Meds ordered this encounter  Medications  . olmesartan  (BENICAR ) 5 MG tablet    Sig: Take 1 tablet (5 mg total) by mouth daily.    Dispense:  30 tablet    Refill:  3    Discontinue amlodipine , fill new script today!!  . dapagliflozin  propanediol (FARXIGA ) 10 MG TABS tablet    Sig: Take 1 tablet (10 mg total) by mouth daily.    Getting the med from the PAP program with manufacturer.    Return in about 3 months (around 11/29/2023) for F/U, Recheck A1C, Jahden Schara PCP.   Total time spent:30 Minutes Time spent includes review of chart, medications, test results, and follow up plan with the patient.   [place] Controlled Substance Database was reviewed by me.  This patient was seen by Laurence Pons, FNP-C in collaboration with Dr. Verneta Gone as a part of collaborative care agreement.   Codylee Patil R. Bobbi Burow, MSN, FNP-C Internal medicine

## 2023-08-30 LAB — MICROALBUMIN / CREATININE URINE RATIO
Creatinine, Urine: 39.8 mg/dL
Microalb/Creat Ratio: 775 mg/g{creat} — ABNORMAL HIGH (ref 0–29)
Microalbumin, Urine: 308.6 ug/mL

## 2023-08-31 ENCOUNTER — Encounter: Payer: Self-pay | Admitting: Nurse Practitioner

## 2023-09-14 DIAGNOSIS — Z0100 Encounter for examination of eyes and vision without abnormal findings: Secondary | ICD-10-CM | POA: Diagnosis not present

## 2023-09-19 ENCOUNTER — Telehealth: Payer: Self-pay | Admitting: Internal Medicine

## 2023-09-19 NOTE — Telephone Encounter (Signed)
 Left vm to confirm 09/26/23 appointment-Toni

## 2023-09-26 ENCOUNTER — Ambulatory Visit: Payer: Medicare HMO | Admitting: Internal Medicine

## 2023-09-26 DIAGNOSIS — R0602 Shortness of breath: Secondary | ICD-10-CM | POA: Diagnosis not present

## 2023-10-01 ENCOUNTER — Encounter: Payer: Self-pay | Admitting: Nurse Practitioner

## 2023-10-01 DIAGNOSIS — N1832 Chronic kidney disease, stage 3b: Secondary | ICD-10-CM

## 2023-10-01 DIAGNOSIS — R809 Proteinuria, unspecified: Secondary | ICD-10-CM

## 2023-10-03 ENCOUNTER — Telehealth: Payer: Self-pay | Admitting: Nurse Practitioner

## 2023-10-03 NOTE — Telephone Encounter (Signed)
 Nephrology referral sent via [facility] to [facility].  Notified patient. Gave pt telephone# [phone]

## 2023-10-08 ENCOUNTER — Encounter: Payer: Self-pay | Admitting: Physician Assistant

## 2023-10-08 ENCOUNTER — Ambulatory Visit (INDEPENDENT_AMBULATORY_CARE_PROVIDER_SITE_OTHER): Payer: Medicare HMO | Admitting: Physician Assistant

## 2023-10-08 VITALS — BP 128/70 | HR 74 | Temp 98.7°F | Resp 16 | Ht 61.5 in | Wt 153.0 lb

## 2023-10-08 DIAGNOSIS — Z7189 Other specified counseling: Secondary | ICD-10-CM

## 2023-10-08 DIAGNOSIS — G4733 Obstructive sleep apnea (adult) (pediatric): Secondary | ICD-10-CM | POA: Diagnosis not present

## 2023-10-08 NOTE — Progress Notes (Signed)
 [facility] [address] [place], [place] [address]  Pulmonary Sleep Medicine   Office Visit Note  Patient Name: Tiffany Hawkins DOB: 07/01/1945 MRN [id number]  Date of Service: 10/08/2023  Complaints/HPI: Pt is here for routine pulmonary follow up. Using CPAP nightly and benefiting from Use. Well controlled on current range. Some issues with supplies/billing with DME and will check on this. Keeping supplies clean. PFT normal. BP improved on recheck.  CPAP use compliance 09/08/23-10/07/23 Use 30/30 days >4 hours 100% Avg use 9 hrs Settings APAP 10-17cm H2O AHI 2.2  leak 95th 10.2   ROS  General: (-) fever, (-) chills, (-) night sweats, (-) weakness Skin: (-) rashes, (-) itching,. Eyes: (-) visual changes, (-) redness, (-) itching. Nose and Sinuses: (-) nasal stuffiness or itchiness, (-) postnasal drip, (-) nosebleeds, (-) sinus trouble. Mouth and Throat: (-) sore throat, (-) hoarseness. Neck: (-) swollen glands, (-) enlarged thyroid , (-) neck pain. Respiratory: + cough, (-) bloody sputum, - shortness of breath, - wheezing. Cardiovascular: - ankle swelling, (-) chest pain. Lymphatic: (-) lymph node enlargement. Neurologic: (-) numbness, (-) tingling. Psychiatric: (-) anxiety, (-) depression   Current Medication: Outpatient Encounter Medications as of 10/08/2023  Medication Sig   Blood Glucose Monitoring Suppl (TRUE METRIX METER) w/Device KIT Use as directed DxE11.65   cetirizine  (ZYRTEC ) 10 MG chewable tablet Chew 1 tablet (10 mg total) by mouth daily. (Patient taking differently: Chew 10 mg by mouth daily as needed.)   chlorpheniramine -HYDROcodone  (TUSSIONEX) 10-8 MG/5ML Take 5 mLs by mouth every 12 (twelve) hours as needed for cough.   citalopram  (CELEXA ) 40 MG tablet TAKE 1 TABLET EVERY DAY   cyclobenzaprine  (FLEXERIL ) 5 MG tablet Take 1 tablet (5 mg total) by mouth 3 (three) times daily as needed for muscle spasms.   dapagliflozin  propanediol  (FARXIGA ) 10 MG TABS tablet Take 1 tablet (10 mg total) by mouth daily.   fenofibrate  54 MG tablet TAKE 1 TABLET EVERY DAY   fluticasone  (FLONASE ) 50 MCG/ACT nasal spray Place 2 sprays into both nostrils daily.   glucose blood (TRUE METRIX BLOOD GLUCOSE TEST) test strip TEST BLOOD SUGAR THREE TIMES DAILY AFTER MEALS   insulin  aspart protamine - aspart (NOVOLOG  MIX 70/30 FLEXPEN) (70-30) 100 UNIT/ML FlexPen Inject 35 units Payne QAM and inject 40units  QPM   lovastatin  (MEVACOR ) 20 MG tablet Take 2 tablets (40 mg total) by mouth at bedtime. For high cholesterol   Multiple Vitamin (MULTIVITAMIN) tablet Take by mouth daily. TAKES 1/2 TABLET   olmesartan  (BENICAR ) 5 MG tablet Take 1 tablet (5 mg total) by mouth daily.   ondansetron  (ZOFRAN ) 4 MG tablet Take 1 tablet (4 mg total) by mouth every 8 (eight) hours as needed for nausea or vomiting.   pantoprazole  (PROTONIX ) 20 MG tablet TAKE 2 TABLETS EVERY DAY   sharps container 1 each by Does not apply route as needed. To use for insulin  syringes and needles. Taking insulin  at least twice daily.  E11.65   spironolactone (ALDACTONE) 100 MG tablet Take 100 mg by mouth daily. 2 tablets once a day   tirzepatide  (MOUNJARO ) 2.5 MG/0.5ML Pen Inject 2.5 mg into the skin once a week.   TRUEplus Lancets 33G MISC TEST BLOOD SUGAR THREE TIMES DAILY AS DIRECTED   No facility-administered encounter medications on file as of 10/08/2023.    Surgical History: Past Surgical History:  Procedure Laterality Date   APPENDECTOMY     BACK SURGERY     CATARACT EXTRACTION W/PHACO Left 12/03/2017  Procedure: CATARACT EXTRACTION PHACO AND INTRAOCULAR LENS PLACEMENT (IOC) LEFT  DIABETIC;  Surgeon: Myrna Adine Anes, MD;  Location: [facility];  Service: Ophthalmology;  Laterality: Left;  Diabetic - insulin    CATARACT EXTRACTION W/PHACO Right 01/14/2018   Procedure: CATARACT EXTRACTION PHACO AND INTRAOCULAR LENS PLACEMENT (IOC) RIGHT DIABETIC;  Surgeon: Myrna Adine Anes, MD;  Location: [facility];  Service: Ophthalmology;  Laterality: Right;  Diabetic - insulin    CERVICAL DISC SURGERY     CHOLECYSTECTOMY     COLON SURGERY     COLONOSCOPY     COLONOSCOPY N/A 07/03/2023   Procedure: COLONOSCOPY;  Surgeon: Unk Corinn Skiff, MD;  Location: [facility];  Service: Endoscopy;  Laterality: N/A;  Diabetic   COLONOSCOPY WITH PROPOFOL  N/A 05/17/2015   Procedure: COLONOSCOPY WITH PROPOFOL ;  Surgeon: Lamar ONEIDA Holmes, MD;  Location: [facility]COPY;  Service: Endoscopy;  Laterality: N/A;   COLONOSCOPY WITH PROPOFOL  N/A 04/01/2018   Procedure: COLONOSCOPY WITH BIOPSY;  Surgeon: Jinny Carmine, MD;  Location: [facility];  Service: Endoscopy;  Laterality: N/A;  Diabetic - insulin    EMBOLIZATION (CATH LAB) N/A 01/24/2021   Procedure: EMBOLIZATION;  Surgeon: Marea Selinda RAMAN, MD;  Location: [facility] INVASIVE CV LAB;  Service: Cardiovascular;  Laterality: N/A;   ESOPHAGOGASTRODUODENOSCOPY     HERNIA REPAIR     LOWER EXTREMITY VENOGRAPHY Left 01/24/2021   Procedure: LOWER EXTREMITY VENOGRAPHY;  Surgeon: Marea Selinda RAMAN, MD;  Location: [facility] INVASIVE CV LAB;  Service: Cardiovascular;  Laterality: Left;   POLYPECTOMY N/A 04/01/2018   Procedure: POLYPECTOMY;  Surgeon: Jinny Carmine, MD;  Location: [facility];  Service: Endoscopy;  Laterality: N/A;   POLYPECTOMY  07/03/2023   Procedure: POLYPECTOMY;  Surgeon: Unk Corinn Skiff, MD;  Location: [facility];  Service: Endoscopy;;   PORT A CATH INJECTION ([facility] HX)     Port has been removed   TUBAL LIGATION      Medical History: Past Medical History:  Diagnosis Date   Anxiety    Atherosclerosis of aorta (HCC)    Colon cancer (HCC) 10/12/2014   Stage IIB; had chemo   Diabetes mellitus without complication (HCC)    Hyperlipidemia    Hypertension    Intractable episodic headache    Lateral epicondylitis    OSA on CPAP    OSA on CPAP    Personal history of chemotherapy    Polio     childhood   Skin cancer    Sqamous Cell, Lt Calf   Sleep apnea    CPAP   Uncontrolled diabetes mellitus with hyperglycemia (HCC)     Family History: Family History  Problem Relation Age of Onset   Alzheimer's disease Mother    Heart disease Father    Colon cancer Cousin    Colon cancer Cousin    Cancer Cousin        Brain Tumor   Breast cancer Neg Hx     Social History: Social History   Socioeconomic History   Marital status: Married    Spouse name: Not on file   Number of children: Not on file   Years of education: Not on file   Highest education level: Not on file  Occupational History   Not on file  Tobacco Use   Smoking status: Former    Current packs/day: 0.00    Average packs/day: 1.5 packs/day for 20.0 years (30.0 ttl pk-yrs)    Types: Cigarettes    Start date: 04/17/1961    Quit  date: 04/17/1981    Years since quitting: 42.5   Smokeless tobacco: Never  Vaping Use   Vaping status: Never Used  Substance and Sexual Activity   Alcohol use: No   Drug use: No   Sexual activity: Yes  Other Topics Concern   Not on file  Social History Narrative   Not on file   Social Drivers of Health   Financial Resource Strain: Low Risk  (06/22/2021)   Overall Financial Resource Strain (CARDIA)    Difficulty of Paying Living Expenses: Not hard at all  Food Insecurity: Not on file  Transportation Needs: No Transportation Needs (06/22/2021)   PRAPARE - Administrator, Civil Service (Medical): No    Lack of Transportation (Non-Medical): No  Physical Activity: Not on file  Stress: Not on file  Social Connections: Not on file  Intimate Partner Violence: Not on file    Vital Signs: Blood pressure 128/70, pulse 74, temperature 98.7 F (37.1 C), resp. rate 16, height 5' 1.5 (1.562 m), weight 153 lb (69.4 kg), SpO2 95%.  Examination: General Appearance: The patient is well-developed, well-nourished, and in no distress. Skin: Gross inspection of skin  unremarkable. Head: normocephalic, no gross deformities. Eyes: no gross deformities noted. ENT: ears appear grossly normal no exudates. Neck: Supple. No thyromegaly. No LAD. Respiratory: Lungs clear to auscultation bilaterally. Cardiovascular: Normal S1 and S2 without murmur or rub. Extremities: No cyanosis. pulses are equal. Neurologic: Alert and oriented. No involuntary movements.  LABS: Recent Results (from the past 2160 hours)  POCT glycosylated hemoglobin (Hb A1C)     Status: Abnormal   Collection Time: 07/30/23 10:07 AM  Result Value Ref Range   Hemoglobin A1C 6.1 (A) 4.0 - 5.6 %   HbA1c POC (<> result, manual entry)     HbA1c, POC (prediabetic range)     HbA1c, POC (controlled diabetic range)    Urine Microalbumin w/creat. ratio     Status: Abnormal   Collection Time: 08/29/23  3:49 PM  Result Value Ref Range   Creatinine, Urine 39.8 Not Estab. mg/dL   Microalbumin, Urine 691.3 Not Estab. ug/mL   Microalb/Creat Ratio 775 (H) 0 - 29 mg/g creat    Comment:                        Normal:                0 -  29                        Moderately increased: 30 - 300                        Severely increased:       >300     Radiology: No results found.  No results found.  No results found.    Assessment and Plan: Patient Active Problem List   Diagnosis Date Noted   Mixed stress and urge urinary incontinence 08/29/2023   Stage 3b chronic kidney disease (HCC) 07/30/2023   Hx of colonic polyps 07/03/2023   Polyp of descending colon 07/03/2023   Polyp of transverse colon 07/03/2023   Aortic atherosclerosis (HCC) 12/29/2020   Deformity of toe of left foot 03/28/2020   H/O multiple pulmonary nodules 09/22/2019   Chronic cough 09/22/2019   Perennial allergic rhinitis 09/17/2019   Acute upper respiratory infection 08/28/2019   Weakness 08/28/2019   Acute bronchitis  06/01/2019   Cough productive of yellow sputum 06/01/2019   Vasomotor rhinitis 05/01/2019   Chronic  kidney disease, stage II (mild) 03/14/2019   Gastroesophageal reflux disease without esophagitis 03/14/2019   Intractable episodic headache 03/14/2019   Need for prophylactic vaccination with combined diphtheria-tetanus-pertussis (DTaP) vaccine 03/14/2019   Renal cyst 11/27/2018   Abnormality of gait and mobility 11/27/2018   Hepatic steatosis 10/31/2018   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/31/2018   Elevated ferritin 10/31/2018   Vertigo 08/13/2018   Gastroenteritis 05/09/2018   Nausea 05/09/2018   Personal history of colon cancer    Polyp of sigmoid colon    Dysuria 02/23/2018   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 02/10/2018   Encounter for general adult medical examination with abnormal findings 02/10/2018   Acute non-recurrent pansinusitis 08/24/2017   Other headache syndrome 08/24/2017   Uncontrolled type 2 diabetes mellitus with hyperglycemia (HCC) 08/24/2017   Type 2 diabetes mellitus with stage 3b chronic kidney disease, with long-term current use of insulin  (HCC) 08/22/2017   Need for vaccination against Streptococcus pneumoniae using pneumococcal conjugate vaccine 13 08/22/2017   Hypertension associated with type 2 diabetes mellitus (HCC) 05/03/2017   Otalgia, bilateral 05/02/2017   Type 2 diabetes mellitus with hyperglycemia (HCC) 05/02/2017   Hyperlipidemia associated with type 2 diabetes mellitus (HCC) 05/02/2017   Right lower quadrant pain 06/29/2016   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 Obstructive sleep apnea (Primary) Continue cpap nightly  2. CPAP use counseling CPAP couseling-Discussed importance of adequate CPAP use as well as proper care and cleaning techniques of machine and all supplies.    General Counseling: I have discussed the findings of the evaluation and examination with Reena.  I have also discussed  any further diagnostic evaluation thatmay be needed or ordered today. Eyva verbalizes understanding of the findings of todays visit. We also reviewed her medications today and discussed drug interactions and side effects including but not limited excessive drowsiness and altered mental states. We also discussed that there is always a risk not just to her but also people around her. she has been encouraged to call the office with any questions or concerns that should arise related to todays visit.  No orders of the defined types were placed in this encounter.    Time spent: 30  I have personally obtained a history, examined the patient, evaluated laboratory and imaging results, formulated the assessment and plan and placed orders. This patient was seen by Tinnie Pro, PA-C in collaboration with Dr. Elfreda Bathe as a part of collaborative care agreement.     Elfreda DELENA Bathe, MD [facility] Pulmonary and Critical Care Sleep medicine

## 2023-10-15 DIAGNOSIS — N1832 Chronic kidney disease, stage 3b: Secondary | ICD-10-CM | POA: Diagnosis not present

## 2023-10-15 DIAGNOSIS — R809 Proteinuria, unspecified: Secondary | ICD-10-CM | POA: Diagnosis not present

## 2023-10-15 DIAGNOSIS — E1122 Type 2 diabetes mellitus with diabetic chronic kidney disease: Secondary | ICD-10-CM | POA: Diagnosis not present

## 2023-10-16 ENCOUNTER — Telehealth: Payer: Self-pay | Admitting: Nurse Practitioner

## 2023-10-16 NOTE — Telephone Encounter (Signed)
 Per Lonell w/ [facility], referral has been closed due to patient not returning calls -Andree

## 2023-10-17 ENCOUNTER — Other Ambulatory Visit: Payer: Self-pay | Admitting: Nephrology

## 2023-10-17 DIAGNOSIS — E1122 Type 2 diabetes mellitus with diabetic chronic kidney disease: Secondary | ICD-10-CM

## 2023-10-17 DIAGNOSIS — N1832 Chronic kidney disease, stage 3b: Secondary | ICD-10-CM

## 2023-10-17 DIAGNOSIS — R809 Proteinuria, unspecified: Secondary | ICD-10-CM

## 2023-10-25 ENCOUNTER — Ambulatory Visit
Admission: RE | Admit: 2023-10-25 | Discharge: 2023-10-25 | Disposition: A | Discharge status: Home / Self Care | Source: Ambulatory Visit | Attending: Nephrology | Admitting: Nephrology

## 2023-10-25 ENCOUNTER — Telehealth: Payer: Self-pay

## 2023-10-25 DIAGNOSIS — R809 Proteinuria, unspecified: Secondary | ICD-10-CM | POA: Diagnosis not present

## 2023-10-25 DIAGNOSIS — E1122 Type 2 diabetes mellitus with diabetic chronic kidney disease: Secondary | ICD-10-CM | POA: Diagnosis not present

## 2023-10-25 DIAGNOSIS — N281 Cyst of kidney, acquired: Secondary | ICD-10-CM | POA: Diagnosis not present

## 2023-10-25 DIAGNOSIS — N1832 Chronic kidney disease, stage 3b: Secondary | ICD-10-CM | POA: Diagnosis not present

## 2023-10-25 NOTE — Telephone Encounter (Signed)
 Left message for patient to give office a call back.

## 2023-10-25 NOTE — Telephone Encounter (Signed)
 Patient notified

## 2023-10-28 NOTE — Procedures (Signed)
 [facility] [address] [place], [address]    Complete Pulmonary Function Testing Interpretation:  FINDINGS:  Forced vital capacity is mildly decreased.  FEV1 is 1.74 L which is 95% of predicted and is within normal limits.  F1 FVC ratio is normal.  Postbronchodilator no significant change in FEV1 was noted.  Total lung capacity is normal residual volume is normal residual volume total lung capacity ratio is normal.  DLCO is within normal limits.  IMPRESSION:  This pulmonary function study is within normal limits.  Elfreda DELENA Bathe, MD [facility] Pulmonary Critical Care Medicine Sleep Medicine

## 2023-10-29 DIAGNOSIS — Z872 Personal history of diseases of the skin and subcutaneous tissue: Secondary | ICD-10-CM | POA: Diagnosis not present

## 2023-10-29 DIAGNOSIS — L680 Hirsutism: Secondary | ICD-10-CM | POA: Diagnosis not present

## 2023-10-29 DIAGNOSIS — Z859 Personal history of malignant neoplasm, unspecified: Secondary | ICD-10-CM | POA: Diagnosis not present

## 2023-10-29 DIAGNOSIS — L578 Other skin changes due to chronic exposure to nonionizing radiation: Secondary | ICD-10-CM | POA: Diagnosis not present

## 2023-11-06 LAB — PULMONARY FUNCTION TEST

## 2023-11-12 DIAGNOSIS — E1122 Type 2 diabetes mellitus with diabetic chronic kidney disease: Secondary | ICD-10-CM | POA: Diagnosis not present

## 2023-11-12 DIAGNOSIS — N1831 Chronic kidney disease, stage 3a: Secondary | ICD-10-CM | POA: Diagnosis not present

## 2023-11-12 DIAGNOSIS — R809 Proteinuria, unspecified: Secondary | ICD-10-CM | POA: Diagnosis not present

## 2023-11-16 ENCOUNTER — Telehealth: Payer: Self-pay

## 2023-11-16 NOTE — Progress Notes (Signed)
   11/16/2023  Patient ID: Tiffany Hawkins, female   DOB: [birth year], 78 y.o.   MRN: [id number]  This patient is appearing on a report for being at risk of failing the adherence measure for identified medications this calendar year.   Medication Adherence Summary (STAR/HEDIS Monitoring): Adherence Category: hypertension (ACEi/ARB)    Drug Name: Olmesartan  5 mg  Last Fill or Sold Date:10/26/2023 Days' Supply: 30 -This patient is on the adherence report     Notes: ? Adherence data pulled from pharmacy claims portal Dr. Annemarie. ? Reviewed barriers to adherence: days supply. ? Plan: Will collaborate with provider to switch to 90 or 100 days supply.  Dorcas Solian, PharmD Clinical Pharmacist Cell: [phone]

## 2023-11-19 ENCOUNTER — Telehealth: Payer: Self-pay

## 2023-11-19 NOTE — Telephone Encounter (Signed)
-----   Message from CMA Nimisha P sent at 11/19/2023  9:38 AM EDT ----- Regarding: RE: Medication Adherence Good morning  We will send 90 days at her next visit ----- Message ----- From: Cleatus Dorcas SAUNDERS, [facility] Sent: 11/16/2023   3:19 PM EDT To: Tinnie Creston Mardy Liana, NP Subject: Medication Adherence                           Hi,   This patient is on the adherence report for Olmesartan  5 mg which was last filled for a 30-days supply on 10/26/2023. If deemed clinically appropriate, please consider switching to 90 or 100 days supply.   Thanks,  Dorcas Cleatus, PharmD Clinical Pharmacist Cell: [phone]

## 2023-11-25 ENCOUNTER — Other Ambulatory Visit: Payer: Self-pay | Admitting: Nurse Practitioner

## 2023-11-25 DIAGNOSIS — E1122 Type 2 diabetes mellitus with diabetic chronic kidney disease: Secondary | ICD-10-CM

## 2023-11-26 NOTE — Telephone Encounter (Signed)
 Please review

## 2023-11-29 ENCOUNTER — Encounter: Payer: Self-pay | Admitting: Nurse Practitioner

## 2023-11-29 ENCOUNTER — Ambulatory Visit: Admitting: Nurse Practitioner

## 2023-11-29 VITALS — BP 126/70 | HR 98 | Temp 98.1°F | Resp 16 | Ht 61.5 in | Wt 146.4 lb

## 2023-11-29 DIAGNOSIS — E785 Hyperlipidemia, unspecified: Secondary | ICD-10-CM

## 2023-11-29 DIAGNOSIS — N1832 Chronic kidney disease, stage 3b: Secondary | ICD-10-CM

## 2023-11-29 DIAGNOSIS — I152 Hypertension secondary to endocrine disorders: Secondary | ICD-10-CM | POA: Diagnosis not present

## 2023-11-29 DIAGNOSIS — Z794 Long term (current) use of insulin: Secondary | ICD-10-CM | POA: Diagnosis not present

## 2023-11-29 DIAGNOSIS — E1169 Type 2 diabetes mellitus with other specified complication: Secondary | ICD-10-CM

## 2023-11-29 DIAGNOSIS — E1159 Type 2 diabetes mellitus with other circulatory complications: Secondary | ICD-10-CM

## 2023-11-29 DIAGNOSIS — E1122 Type 2 diabetes mellitus with diabetic chronic kidney disease: Secondary | ICD-10-CM

## 2023-11-29 LAB — POCT GLYCOSYLATED HEMOGLOBIN (HGB A1C): Hemoglobin A1C: 6.2 % — AB (ref 4.0–5.6)

## 2023-11-29 MED ORDER — OLMESARTAN MEDOXOMIL 5 MG PO TABS: 5.0000 mg | ORAL_TABLET | Freq: Every day | ORAL | 3 refills | Status: DC

## 2023-11-29 NOTE — Progress Notes (Signed)
 [facility] [address] [place], [place] [address]  Internal MEDICINE  Office Visit Note  Patient Name: Tiffany Hawkins  [id number]  [id number]  Date of Service: 11/29/2023  Chief Complaint  Patient presents with   Diabetes   Hypertension   Hyperlipidemia   Follow-up    HPI Tiffany Hawkins presents for a follow-up visit for diabetes, hypertension, CKD and high cholesterol.  Diabetes -- A1c is stable at 6.2 today.  Taking farxiga , insulin  and mounjaro  Hypertension -- BP is ok, but need to start the olmesartan  for kidney function  CKD stage 3b-- taking farxiga  and has a prescription for olmesartan  but did not start the medication.  High cholesterol -- taking lovastatin  and fenofibrate .     Current Medication: Outpatient Encounter Medications as of 11/29/2023  Medication Sig   Blood Glucose Monitoring Suppl (TRUE METRIX METER) w/Device KIT Use as directed DxE11.65   cetirizine  (ZYRTEC ) 10 MG chewable tablet Chew 1 tablet (10 mg total) by mouth daily. (Patient taking differently: Chew 10 mg by mouth daily as needed.)   chlorpheniramine -HYDROcodone  (TUSSIONEX) 10-8 MG/5ML Take 5 mLs by mouth every 12 (twelve) hours as needed for cough.   citalopram  (CELEXA ) 40 MG tablet TAKE 1 TABLET EVERY DAY   cyclobenzaprine  (FLEXERIL ) 5 MG tablet Take 1 tablet (5 mg total) by mouth 3 (three) times daily as needed for muscle spasms.   dapagliflozin  propanediol (FARXIGA ) 10 MG TABS tablet Take 1 tablet (10 mg total) by mouth daily.   fenofibrate  54 MG tablet TAKE 1 TABLET EVERY DAY   fluticasone  (FLONASE ) 50 MCG/ACT nasal spray Place 2 sprays into both nostrils daily.   glucose blood (TRUE METRIX BLOOD GLUCOSE TEST) test strip TEST BLOOD SUGAR THREE TIMES DAILY AFTER MEALS   insulin  aspart protamine - aspart (NOVOLOG  MIX 70/30 FLEXPEN) (70-30) 100 UNIT/ML FlexPen Inject 35 units [place] QAM and inject 40units [place] QPM   lovastatin  (MEVACOR ) 20 MG tablet Take 2 tablets (40 mg total) by mouth at  bedtime. For high cholesterol   MOUNJARO  2.5 MG/0.5ML Pen INJECT 2.5 MG INTO THE SKIN ONCE A WEEK.   Multiple Vitamin (MULTIVITAMIN) tablet Take by mouth daily. TAKES 1/2 TABLET   olmesartan  (BENICAR ) 5 MG tablet Take 1 tablet (5 mg total) by mouth daily.   ondansetron  (ZOFRAN ) 4 MG tablet Take 1 tablet (4 mg total) by mouth every 8 (eight) hours as needed for nausea or vomiting.   pantoprazole  (PROTONIX ) 20 MG tablet TAKE 2 TABLETS EVERY DAY   sharps container 1 each by Does not apply route as needed. To use for insulin  syringes and needles. Taking insulin  at least twice daily.  E11.65   spironolactone (ALDACTONE) 100 MG tablet Take 100 mg by mouth daily. 2 tablets once a day   TRUEplus Lancets 33G MISC TEST BLOOD SUGAR THREE TIMES DAILY AS DIRECTED   [DISCONTINUED] olmesartan  (BENICAR ) 5 MG tablet Take 1 tablet (5 mg total) by mouth daily.   No facility-administered encounter medications on file as of 11/29/2023.    Surgical History: Past Surgical History:  Procedure Laterality Date   APPENDECTOMY     BACK SURGERY     CATARACT EXTRACTION W/PHACO Left 12/03/2017   Procedure: CATARACT EXTRACTION PHACO AND INTRAOCULAR LENS PLACEMENT (IOC) LEFT  DIABETIC;  Surgeon: Myrna Adine Anes, MD;  Location: [facility];  Service: Ophthalmology;  Laterality: Left;  Diabetic - insulin    CATARACT EXTRACTION W/PHACO Right 01/14/2018   Procedure: CATARACT EXTRACTION PHACO AND INTRAOCULAR LENS PLACEMENT (IOC) RIGHT DIABETIC;  Surgeon: Myrna Adine Anes, MD;  Location: [facility];  Service: Ophthalmology;  Laterality: Right;  Diabetic - insulin    CERVICAL DISC SURGERY     CHOLECYSTECTOMY     COLON SURGERY     COLONOSCOPY     COLONOSCOPY N/A 07/03/2023   Procedure: COLONOSCOPY;  Surgeon: Unk Corinn Skiff, MD;  Location: [facility];  Service: Endoscopy;  Laterality: N/A;  Diabetic   COLONOSCOPY WITH PROPOFOL  N/A 05/17/2015   Procedure: COLONOSCOPY WITH PROPOFOL ;  Surgeon: Lamar ONEIDA Holmes, MD;  Location: [facility] ENDOSCOPY;  Service: Endoscopy;  Laterality: N/A;   COLONOSCOPY WITH PROPOFOL  N/A 04/01/2018   Procedure: COLONOSCOPY WITH BIOPSY;  Surgeon: Jinny Carmine, MD;  Location: [facility];  Service: Endoscopy;  Laterality: N/A;  Diabetic - insulin    EMBOLIZATION (CATH LAB) N/A 01/24/2021   Procedure: EMBOLIZATION;  Surgeon: Marea Selinda RAMAN, MD;  Location: [facility] INVASIVE CV LAB;  Service: Cardiovascular;  Laterality: N/A;   ESOPHAGOGASTRODUODENOSCOPY     HERNIA REPAIR     LOWER EXTREMITY VENOGRAPHY Left 01/24/2021   Procedure: LOWER EXTREMITY VENOGRAPHY;  Surgeon: Marea Selinda RAMAN, MD;  Location: [facility] INVASIVE CV LAB;  Service: Cardiovascular;  Laterality: Left;   POLYPECTOMY N/A 04/01/2018   Procedure: POLYPECTOMY;  Surgeon: Jinny Carmine, MD;  Location: [facility];  Service: Endoscopy;  Laterality: N/A;   POLYPECTOMY  07/03/2023   Procedure: POLYPECTOMY;  Surgeon: Unk Corinn Skiff, MD;  Location: [facility];  Service: Endoscopy;;   PORT A CATH INJECTION ([facility] HX)     Port has been removed   TUBAL LIGATION      Medical History: Past Medical History:  Diagnosis Date   Anxiety    Atherosclerosis of aorta (HCC)    Colon cancer (HCC) 10/12/2014   Stage IIB; had chemo   Diabetes mellitus without complication (HCC)    Hyperlipidemia    Hypertension    Intractable episodic headache    Lateral epicondylitis    OSA on CPAP    OSA on CPAP    Personal history of chemotherapy    Polio    childhood   Skin cancer    Sqamous Cell, Lt Calf   Sleep apnea    CPAP   Uncontrolled diabetes mellitus with hyperglycemia (HCC)     Family History: Family History  Problem Relation Age of Onset   Alzheimer's disease Mother    Heart disease Father    Colon cancer Cousin    Colon cancer Cousin    Cancer Cousin        Brain Tumor   Breast cancer Neg Hx     Social History   Socioeconomic History   Marital status: Married    Spouse name: Not on file    Number of children: Not on file   Years of education: Not on file   Highest education level: Not on file  Occupational History   Not on file  Tobacco Use   Smoking status: Former    Current packs/day: 0.00    Average packs/day: 1.5 packs/day for 20.0 years (30.0 ttl pk-yrs)    Types: Cigarettes    Start date: 04/17/1961    Quit date: 04/17/1981    Years since quitting: 42.7   Smokeless tobacco: Never  Vaping Use   Vaping status: Never Used  Substance and Sexual Activity   Alcohol use: No   Drug use: No   Sexual activity: Yes  Other Topics Concern   Not on file  Social History Narrative  Not on file   Social Drivers of Health   Financial Resource Strain: Low Risk  (06/22/2021)   Overall Financial Resource Strain (CARDIA)    Difficulty of Paying Living Expenses: Not hard at all  Food Insecurity: Not on file  Transportation Needs: No Transportation Needs (06/22/2021)   PRAPARE - Administrator, Civil Service (Medical): No    Lack of Transportation (Non-Medical): No  Physical Activity: Not on file  Stress: Not on file  Social Connections: Not on file  Intimate Partner Violence: Not on file      Review of Systems  Constitutional:  Negative for chills, fatigue and unexpected weight change.  HENT:  Negative for congestion, postnasal drip, rhinorrhea, sneezing and sore throat.   Eyes:  Negative for redness.  Respiratory: Negative.  Negative for cough, chest tightness, shortness of breath and wheezing.   Cardiovascular: Negative.  Negative for chest pain and palpitations.  Gastrointestinal: Negative.  Negative for abdominal pain, constipation, diarrhea, nausea and vomiting.  Genitourinary:  Negative for dysuria and frequency.  Musculoskeletal:  Negative for arthralgias, back pain, joint swelling and neck pain.  Skin:  Negative for rash.  Neurological:  Negative for tremors and numbness.  Hematological:  Negative for adenopathy. Does not bruise/bleed easily.   Psychiatric/Behavioral:  Negative for behavioral problems (Depression), sleep disturbance and suicidal ideas. The patient is not nervous/anxious.     Vital Signs: BP 126/70   Pulse 98   Temp 98.1 F (36.7 C)   Resp 16   Ht 5' 1.5 (1.562 m)   Wt 146 lb 6.4 oz (66.4 kg)   SpO2 97%   BMI 27.21 kg/m    Physical Exam Vitals reviewed.  Constitutional:      General: She is not in acute distress.    Appearance: Normal appearance. She is normal weight. She is not ill-appearing.  HENT:     Head: Normocephalic and atraumatic.  Eyes:     Pupils: Pupils are equal, round, and reactive to light.  Cardiovascular:     Rate and Rhythm: Normal rate and regular rhythm.  Pulmonary:     Effort: Pulmonary effort is normal. No respiratory distress.  Neurological:     Mental Status: She is alert and oriented to person, place, and time.     Cranial Nerves: No cranial nerve deficit.     Coordination: Coordination normal.     Gait: Gait normal.  Psychiatric:        Mood and Affect: Mood normal.        Behavior: Behavior normal.        Assessment/Plan: 1. Type 2 diabetes mellitus with stage 3b chronic kidney disease, with long-term current use of insulin  (HCC) (Primary) A1c is is stable at 6.2. continue medications as prescribed. - POCT glycosylated hemoglobin (Hb A1C)  2. Stage 3b chronic kidney disease (HCC) Start olmesartan  as prescribed  - olmesartan  (BENICAR ) 5 MG tablet; Take 1 tablet (5 mg total) by mouth daily.  Dispense: 30 tablet; Refill: 3  3. Hypertension associated with type 2 diabetes mellitus (HCC) Start olmesartan  as prescribed  - olmesartan  (BENICAR ) 5 MG tablet; Take 1 tablet (5 mg total) by mouth daily.  Dispense: 30 tablet; Refill: 3  4. Hyperlipidemia associated with type 2 diabetes mellitus (HCC) Continue lovastatin  and fenofibrate  as prescribed.    General Counseling: stephaine breshears understanding of the findings of todays visit and agrees with plan of  treatment. I have discussed any further diagnostic evaluation that may be needed or  ordered today. We also reviewed her medications today. she has been encouraged to call the office with any questions or concerns that should arise related to todays visit.    Orders Placed This Encounter  Procedures   POCT glycosylated hemoglobin (Hb A1C)    Meds ordered this encounter  Medications   olmesartan  (BENICAR ) 5 MG tablet    Sig: Take 1 tablet (5 mg total) by mouth daily.    Dispense:  30 tablet    Refill:  3    Discontinue amlodipine , fill new script today!!    Return for previously schedule AWV in december. .   Total time spent:30 Minutes Time spent includes review of chart, medications, test results, and follow up plan with the patient.   [place] Controlled Substance Database was reviewed by me.  This patient was seen by Mardy Maxin, FNP-C in collaboration with Dr. Sigrid Bathe as a part of collaborative care agreement.   Myasia Sinatra R. Maxin, MSN, FNP-C Internal medicine

## 2024-01-07 ENCOUNTER — Encounter: Payer: Self-pay | Admitting: Nurse Practitioner

## 2024-01-09 ENCOUNTER — Other Ambulatory Visit: Payer: Self-pay | Admitting: Nurse Practitioner

## 2024-01-09 DIAGNOSIS — I700 Atherosclerosis of aorta: Secondary | ICD-10-CM

## 2024-01-27 ENCOUNTER — Other Ambulatory Visit: Payer: Self-pay | Admitting: Nurse Practitioner

## 2024-01-27 DIAGNOSIS — E1169 Type 2 diabetes mellitus with other specified complication: Secondary | ICD-10-CM

## 2024-03-05 ENCOUNTER — Other Ambulatory Visit: Payer: Self-pay | Admitting: Nurse Practitioner

## 2024-03-05 DIAGNOSIS — E1159 Type 2 diabetes mellitus with other circulatory complications: Secondary | ICD-10-CM

## 2024-03-05 DIAGNOSIS — N1832 Chronic kidney disease, stage 3b: Secondary | ICD-10-CM

## 2024-03-10 ENCOUNTER — Other Ambulatory Visit: Payer: Self-pay | Admitting: Nurse Practitioner

## 2024-03-18 DIAGNOSIS — Z7985 Long-term (current) use of injectable non-insulin antidiabetic drugs: Secondary | ICD-10-CM | POA: Diagnosis not present

## 2024-03-18 DIAGNOSIS — N1831 Chronic kidney disease, stage 3a: Secondary | ICD-10-CM | POA: Diagnosis not present

## 2024-03-18 DIAGNOSIS — E1122 Type 2 diabetes mellitus with diabetic chronic kidney disease: Secondary | ICD-10-CM | POA: Diagnosis not present

## 2024-03-18 DIAGNOSIS — R809 Proteinuria, unspecified: Secondary | ICD-10-CM | POA: Diagnosis not present

## 2024-03-28 ENCOUNTER — Encounter: Payer: Self-pay | Admitting: Nurse Practitioner

## 2024-03-28 ENCOUNTER — Ambulatory Visit (INDEPENDENT_AMBULATORY_CARE_PROVIDER_SITE_OTHER): Payer: Medicare HMO | Admitting: Nurse Practitioner

## 2024-03-28 VITALS — BP 130/70 | HR 91 | Temp 98.1°F | Resp 16 | Ht 61.5 in | Wt 151.2 lb

## 2024-03-28 DIAGNOSIS — I700 Atherosclerosis of aorta: Secondary | ICD-10-CM

## 2024-03-28 DIAGNOSIS — B91 Sequelae of poliomyelitis: Secondary | ICD-10-CM

## 2024-03-28 DIAGNOSIS — Z794 Long term (current) use of insulin: Secondary | ICD-10-CM

## 2024-03-28 DIAGNOSIS — E1169 Type 2 diabetes mellitus with other specified complication: Secondary | ICD-10-CM | POA: Diagnosis not present

## 2024-03-28 DIAGNOSIS — R748 Abnormal levels of other serum enzymes: Secondary | ICD-10-CM | POA: Diagnosis not present

## 2024-03-28 DIAGNOSIS — Z1231 Encounter for screening mammogram for malignant neoplasm of breast: Secondary | ICD-10-CM

## 2024-03-28 DIAGNOSIS — N1832 Chronic kidney disease, stage 3b: Secondary | ICD-10-CM | POA: Diagnosis not present

## 2024-03-28 DIAGNOSIS — E1122 Type 2 diabetes mellitus with diabetic chronic kidney disease: Secondary | ICD-10-CM

## 2024-03-28 DIAGNOSIS — M545 Low back pain, unspecified: Secondary | ICD-10-CM

## 2024-03-28 DIAGNOSIS — E785 Hyperlipidemia, unspecified: Secondary | ICD-10-CM

## 2024-03-28 DIAGNOSIS — E1129 Type 2 diabetes mellitus with other diabetic kidney complication: Secondary | ICD-10-CM | POA: Insufficient documentation

## 2024-03-28 DIAGNOSIS — M6281 Muscle weakness (generalized): Secondary | ICD-10-CM | POA: Diagnosis not present

## 2024-03-28 DIAGNOSIS — Z0001 Encounter for general adult medical examination with abnormal findings: Secondary | ICD-10-CM | POA: Diagnosis not present

## 2024-03-28 DIAGNOSIS — G8929 Other chronic pain: Secondary | ICD-10-CM | POA: Insufficient documentation

## 2024-03-28 DIAGNOSIS — E559 Vitamin D deficiency, unspecified: Secondary | ICD-10-CM | POA: Diagnosis not present

## 2024-03-28 DIAGNOSIS — R809 Proteinuria, unspecified: Secondary | ICD-10-CM | POA: Diagnosis not present

## 2024-03-28 DIAGNOSIS — N3946 Mixed incontinence: Secondary | ICD-10-CM

## 2024-03-28 DIAGNOSIS — E1159 Type 2 diabetes mellitus with other circulatory complications: Secondary | ICD-10-CM

## 2024-03-28 DIAGNOSIS — I152 Hypertension secondary to endocrine disorders: Secondary | ICD-10-CM

## 2024-03-28 NOTE — Patient Instructions (Signed)
STOP olmesartan

## 2024-03-28 NOTE — Progress Notes (Signed)
 [facility] [address] [place], [place] [address]  Internal MEDICINE  Office Visit Note  Patient Name: Tiffany Hawkins  [id number]  [id number]  Date of Service: 03/28/2024  Chief Complaint  Patient presents with   Diabetes   Hyperlipidemia   Hypertension   Medicare Wellness    HPI Tiffany Hawkins presents for a medicare annual wellness visit.  Well-appearing 78 y.o. female with hypertension, aortic atherosclerosis, allergic rhinitis, GERD, high cholesterol and type 2 diabetes. Post-polio syndrome. Chronic low back pain  Started on losartan by nephrology, was unsure if she needed to continue olmesartan , patient instructed to stop olmesartan  today.  Routine CRC screening: due in 2030 if necessary Routine mammogram: discontinued, aged out, patient wants to stop routine mammograms.  DEXA scan: done in 2016 Pap smear: discontinued, aged out Eye exam: goes to [facility] foot exam: done  Labs: some labs have been done by nephrology but is still due for a few things now.  New or worsening pain: chronic low back pain  Other concerns: progressive leg weakness possibly due to chronic low back pain and/or post-polio syndrome   PCP: Mardy Maxin FNP-C Nephrology: Dr. Bonnell Sherry Ophthalmology : Dr. Myrna at [facility].      03/28/2024   10:45 AM 03/28/2023   10:42 AM 03/08/2022   10:58 AM  MMSE - Mini Mental State Exam  Orientation to time 5 5 5   Orientation to Place 5 5 5   Registration 3 3 3   Attention/ Calculation 5 5 5   Recall 3 3 3   Language- name 2 objects 2 2 2   Language- repeat 1 1 1   Language- follow 3 step command 3 3 3   Language- read & follow direction 1 1 1   Write a sentence 1 1 1   Copy design 1 1 1   Total score 30 30 30     Functional Status Survey: Is the patient deaf or have difficulty hearing?: Yes Does the patient have difficulty seeing, even when wearing glasses/contacts?: No Does the patient have difficulty concentrating,  remembering, or making decisions?: Yes Does the patient have difficulty walking or climbing stairs?: Yes Does the patient have difficulty dressing or bathing?: No Does the patient have difficulty doing errands alone such as visiting a doctor's office or shopping?: No     02/08/2022    1:25 PM 03/08/2022   10:56 AM 05/17/2022   10:45 AM 03/28/2023   10:40 AM 03/28/2024   10:44 AM  Fall Risk  Falls in the past year? 0 0 0 0 0  Was there an injury with Fall? 0  0  0  0  0  Fall Risk Category Calculator 0 0 0 0 0  Fall Risk Category (Retired) Low  Low      (RETIRED) Patient Fall Risk Level Low fall risk  Low fall risk      Patient at Risk for Falls Due to No Fall Risks No Fall Risks No Fall Risks No Fall Risks   Fall risk Follow up Falls evaluation completed  Falls evaluation completed  Falls evaluation completed Falls evaluation completed Falls evaluation completed     Data saved with a previous flowsheet row definition       03/28/2024   10:44 AM  Depression screen PHQ 2/9  Decreased Interest 0  Down, Depressed, Hopeless 0  PHQ - 2 Score 0       Current Medication: Outpatient Encounter Medications as of 03/28/2024  Medication Sig   Blood Glucose Monitoring Suppl (  TRUE METRIX METER) w/Device KIT Use as directed DxE11.65   cetirizine  (ZYRTEC ) 10 MG chewable tablet Chew 1 tablet (10 mg total) by mouth daily. (Patient taking differently: Chew 10 mg by mouth daily as needed.)   citalopram  (CELEXA ) 40 MG tablet TAKE 1 TABLET EVERY DAY   cyclobenzaprine  (FLEXERIL ) 5 MG tablet Take 1 tablet (5 mg total) by mouth 3 (three) times daily as needed for muscle spasms.   dapagliflozin  propanediol (FARXIGA ) 10 MG TABS tablet Take 1 tablet (10 mg total) by mouth daily.   fenofibrate  54 MG tablet TAKE 1 TABLET EVERY DAY   fluticasone  (FLONASE ) 50 MCG/ACT nasal spray Place 2 sprays into both nostrils daily.   glucose blood (TRUE METRIX BLOOD GLUCOSE TEST) test strip TEST BLOOD SUGAR THREE  TIMES DAILY AFTER MEALS   insulin  aspart protamine - aspart (NOVOLOG  MIX 70/30 FLEXPEN) (70-30) 100 UNIT/ML FlexPen Inject 35 units [place] QAM and inject 40units [place] QPM   losartan (COZAAR) 25 MG tablet Take 25 mg by mouth daily.   lovastatin  (MEVACOR ) 20 MG tablet TAKE 1 TABLET AT BEDTIME FOR HIGH CHOLESTEROL   MOUNJARO  2.5 MG/0.5ML Pen INJECT 2.5 MG INTO THE SKIN ONCE A WEEK.   Multiple Vitamin (MULTIVITAMIN) tablet Take by mouth daily. TAKES 1/2 TABLET   ondansetron  (ZOFRAN ) 4 MG tablet Take 1 tablet (4 mg total) by mouth every 8 (eight) hours as needed for nausea or vomiting.   pantoprazole  (PROTONIX ) 20 MG tablet TAKE 2 TABLETS EVERY DAY   sharps container 1 each by Does not apply route as needed. To use for insulin  syringes and needles. Taking insulin  at least twice daily.  E11.65   spironolactone (ALDACTONE) 100 MG tablet Take 100 mg by mouth daily. 2 tablets once a day   TRUEplus Lancets 33G MISC TEST BLOOD SUGAR THREE TIMES DAILY AS DIRECTED   [DISCONTINUED] chlorpheniramine -HYDROcodone  (TUSSIONEX) 10-8 MG/5ML Take 5 mLs by mouth every 12 (twelve) hours as needed for cough.   [DISCONTINUED] olmesartan  (BENICAR ) 5 MG tablet TAKE 1 TABLET EVERY DAY (DISCONTINUE AMLODIPINE )   No facility-administered encounter medications on file as of 03/28/2024.    Surgical History: Past Surgical History:  Procedure Laterality Date   APPENDECTOMY     BACK SURGERY     CATARACT EXTRACTION W/PHACO Left 12/03/2017   Procedure: CATARACT EXTRACTION PHACO AND INTRAOCULAR LENS PLACEMENT (IOC) LEFT  DIABETIC;  Surgeon: Myrna Adine Anes, MD;  Location: [facility];  Service: Ophthalmology;  Laterality: Left;  Diabetic - insulin    CATARACT EXTRACTION W/PHACO Right 01/14/2018   Procedure: CATARACT EXTRACTION PHACO AND INTRAOCULAR LENS PLACEMENT (IOC) RIGHT DIABETIC;  Surgeon: Myrna Adine Anes, MD;  Location: [facility];  Service: Ophthalmology;  Laterality: Right;  Diabetic - insulin    CERVICAL  DISC SURGERY     CHOLECYSTECTOMY     COLON SURGERY     COLONOSCOPY     COLONOSCOPY N/A 07/03/2023   Procedure: COLONOSCOPY;  Surgeon: Unk Corinn Skiff, MD;  Location: [facility];  Service: Endoscopy;  Laterality: N/A;  Diabetic   COLONOSCOPY WITH PROPOFOL  N/A 05/17/2015   Procedure: COLONOSCOPY WITH PROPOFOL ;  Surgeon: Lamar ONEIDA Holmes, MD;  Location: [facility] ENDOSCOPY;  Service: Endoscopy;  Laterality: N/A;   COLONOSCOPY WITH PROPOFOL  N/A 04/01/2018   Procedure: COLONOSCOPY WITH BIOPSY;  Surgeon: Jinny Carmine, MD;  Location: [facility];  Service: Endoscopy;  Laterality: N/A;  Diabetic - insulin    EMBOLIZATION (CATH LAB) N/A 01/24/2021   Procedure: EMBOLIZATION;  Surgeon: Marea Selinda RAMAN, MD;  Location:  [facility] INVASIVE CV LAB;  Service: Cardiovascular;  Laterality: N/A;   ESOPHAGOGASTRODUODENOSCOPY     HERNIA REPAIR     LOWER EXTREMITY VENOGRAPHY Left 01/24/2021   Procedure: LOWER EXTREMITY VENOGRAPHY;  Surgeon: Marea Selinda RAMAN, MD;  Location: [facility] INVASIVE CV LAB;  Service: Cardiovascular;  Laterality: Left;   POLYPECTOMY N/A 04/01/2018   Procedure: POLYPECTOMY;  Surgeon: Jinny Carmine, MD;  Location: [facility];  Service: Endoscopy;  Laterality: N/A;   POLYPECTOMY  07/03/2023   Procedure: POLYPECTOMY;  Surgeon: Unk Corinn Skiff, MD;  Location: [facility];  Service: Endoscopy;;   PORT A CATH INJECTION ([facility] HX)     Port has been removed   TUBAL LIGATION      Medical History: Past Medical History:  Diagnosis Date   Anxiety    Atherosclerosis of aorta    Colon cancer (HCC) 10/12/2014   Stage IIB; had chemo   Diabetes mellitus without complication (HCC)    Hyperlipidemia    Hypertension    Intractable episodic headache    Lateral epicondylitis    OSA on CPAP    OSA on CPAP    Personal history of chemotherapy    Polio    childhood   Skin cancer    Sqamous Cell, Lt Calf   Sleep apnea    CPAP   Uncontrolled diabetes mellitus with hyperglycemia (HCC)      Family History: Family History  Problem Relation Age of Onset   Alzheimer's disease Mother    Heart disease Father    Colon cancer Cousin    Colon cancer Cousin    Cancer Cousin        Brain Tumor   Breast cancer Neg Hx     Social History   Socioeconomic History   Marital status: Married    Spouse name: Not on file   Number of children: Not on file   Years of education: Not on file   Highest education level: Not on file  Occupational History   Not on file  Tobacco Use   Smoking status: Former    Current packs/day: 0.00    Average packs/day: 1.5 packs/day for 20.0 years (30.0 ttl pk-yrs)    Types: Cigarettes    Start date: 04/17/1961    Quit date: 04/17/1981    Years since quitting: 42.9   Smokeless tobacco: Never  Vaping Use   Vaping status: Never Used  Substance and Sexual Activity   Alcohol use: No   Drug use: No   Sexual activity: Yes  Other Topics Concern   Not on file  Social History Narrative   Not on file   Social Drivers of Health   Tobacco Use: Medium Risk (03/28/2024)   Patient History    Smoking Tobacco Use: Former    Smokeless Tobacco Use: Never    Passive Exposure: Not on Actuary Strain: Low Risk (06/22/2021)   Overall Financial Resource Strain (CARDIA)    Difficulty of Paying Living Expenses: Not hard at all  Food Insecurity: Not on file  Transportation Needs: No Transportation Needs (06/22/2021)   PRAPARE - Administrator, Civil Service (Medical): No    Lack of Transportation (Non-Medical): No  Physical Activity: Not on file  Stress: Not on file  Social Connections: Not on file  Intimate Partner Violence: Not on file  Depression (PHQ2-9): Low Risk (03/28/2024)   Depression (PHQ2-9)    PHQ-2 Score: 0  Alcohol Screen: Low Risk (11/09/2021)   Alcohol Screen  Last Alcohol Screening Score (AUDIT): 0  Housing: Low Risk (06/22/2021)   Housing    Last Housing Risk Score: 0  Utilities: Not on file  Health Literacy:  Not on file      Review of Systems  Constitutional:  Positive for appetite change, fatigue and unexpected weight change. Negative for activity change, chills and fever.  HENT: Negative.  Negative for congestion, ear pain, rhinorrhea, sore throat and trouble swallowing.   Eyes: Negative.   Respiratory: Negative.  Negative for cough, chest tightness, shortness of breath and wheezing.   Cardiovascular: Negative.  Negative for chest pain.  Gastrointestinal: Negative.  Negative for abdominal pain, blood in stool, constipation, diarrhea, nausea and vomiting.  Endocrine: Positive for polydipsia and polyphagia.  Genitourinary: Negative.  Negative for difficulty urinating, dysuria, frequency, hematuria and urgency.  Musculoskeletal: Negative.  Negative for arthralgias, back pain, joint swelling, myalgias and neck pain.  Skin: Negative.  Negative for rash and wound.  Allergic/Immunologic: Negative.  Negative for immunocompromised state.  Neurological: Negative.  Negative for dizziness, seizures, numbness and headaches.  Hematological: Negative.   Psychiatric/Behavioral: Negative.  Negative for behavioral problems, self-injury and suicidal ideas. The patient is not nervous/anxious.     Vital Signs: BP 130/70   Pulse 91   Temp 98.1 F (36.7 C)   Resp 16   Ht 5' 1.5 (1.562 m)   Wt 151 lb 3.2 oz (68.6 kg)   SpO2 97%   BMI 28.11 kg/m    Physical Exam Vitals reviewed.  Constitutional:      General: She is awake. She is not in acute distress.    Appearance: Normal appearance. She is well-developed and well-groomed. She is obese. She is not ill-appearing or diaphoretic.  HENT:     Head: Normocephalic and atraumatic.     Right Ear: Tympanic membrane, ear canal and external ear normal.     Left Ear: Tympanic membrane, ear canal and external ear normal.     Nose: Nose normal. No congestion or rhinorrhea.     Mouth/Throat:     Lips: Pink.     Mouth: Mucous membranes are moist.     Pharynx:  Oropharynx is clear. Uvula midline. No oropharyngeal exudate or posterior oropharyngeal erythema.  Eyes:     General: Lids are normal. Vision grossly intact. Gaze aligned appropriately. No scleral icterus.       Right eye: No discharge.        Left eye: No discharge.     Extraocular Movements: Extraocular movements intact.     Conjunctiva/sclera: Conjunctivae normal.     Pupils: Pupils are equal, round, and reactive to light.     Funduscopic exam:    Right eye: Red reflex present.        Left eye: Red reflex present. Neck:     Thyroid : No thyromegaly.     Vascular: No carotid bruit or JVD.     Trachea: Trachea and phonation normal. No tracheal deviation.  Cardiovascular:     Rate and Rhythm: Normal rate and regular rhythm.     Pulses: Normal pulses.          Dorsalis pedis pulses are 2+ on the right side and 2+ on the left side.       Posterior tibial pulses are 2+ on the right side and 2+ on the left side.     Heart sounds: Normal heart sounds, S1 normal and S2 normal. No murmur heard.    No friction rub. No gallop.  Pulmonary:     Effort: Pulmonary effort is normal. No accessory muscle usage or respiratory distress.     Breath sounds: Normal breath sounds and air entry. No stridor. No wheezing or rales.  Chest:     Chest wall: No tenderness.     Comments: Declined clinical breast exam Abdominal:     General: Bowel sounds are normal. There is no distension.     Palpations: Abdomen is soft. There is no shifting dullness, fluid wave, mass or pulsatile mass.     Tenderness: There is no abdominal tenderness. There is no guarding or rebound.  Musculoskeletal:        General: No tenderness or deformity. Normal range of motion.     Cervical back: Normal range of motion and neck supple.     Right foot: Prominent metatarsal heads present.     Left foot: Prominent metatarsal heads present.  Feet:     Right foot:     Protective Sensation: 6 sites tested.  5 sites sensed.     Skin  integrity: Callus present.     Toenail Condition: Right toenails are abnormally thick, long and ingrown.     Left foot:     Protective Sensation: 6 sites tested.  5 sites sensed.     Skin integrity: Callus present.     Toenail Condition: Left toenails are abnormally thick, long and ingrown.  Lymphadenopathy:     Cervical: No cervical adenopathy.  Skin:    General: Skin is warm and dry.     Capillary Refill: Capillary refill takes less than 2 seconds.     Coloration: Skin is not pale.     Findings: No erythema or rash.  Neurological:     Mental Status: She is alert and oriented to person, place, and time.     Cranial Nerves: No cranial nerve deficit.     Motor: No abnormal muscle tone.     Coordination: Coordination normal.     Gait: Gait normal.     Deep Tendon Reflexes: Reflexes are normal and symmetric.  Psychiatric:        Mood and Affect: Mood and affect normal.        Behavior: Behavior normal. Behavior is cooperative.        Thought Content: Thought content normal.        Judgment: Judgment normal.        Assessment/Plan: 1. Encounter for Medicare annual examination with abnormal findings (Primary) Age-appropriate preventive screenings and vaccinations discussed. Routine labs for health maintenance ordered, see below. PHM updated.    2. Type 2 diabetes mellitus with stage 3b chronic kidney disease, with long-term current use of insulin  (HCC) Repeat A1c ordered. Continue mounjaro  as prescribed and continue farxiga  as prescribed.  - Hgb A1C w/o eAG  3. Stage 3b chronic kidney disease (HCC) Stop olmesartan  and continue losartan as prescribed by nephrology.   4. Hypertension associated with type 2 diabetes mellitus (HCC) Stop olmesartan  and continue losartan as prescribed by nephrology.   5. Hyperlipidemia associated with type 2 diabetes mellitus (HCC) Routine lab ordered, continue lovastatin  as prescribed  - Lipid Profile  6. Abnormal liver enzymes Routine lab  ordered  - Hepatic function panel  7. Microalbuminuria due to type 2 diabetes mellitus (HCC) Followed by nephrology, ACR has significantly improved.   8. Post-polio limb muscle weakness Referred to orthopedic surgery - Ambulatory referral to Orthopedic Surgery  9. Chronic midline low back pain without sciatica Referred to orthopedic surgery  -  Ambulatory referral to Orthopedic Surgery  10. Vitamin D  deficiency Routine lab ordered. - Vitamin D  (25 hydroxy)      General Counseling: felicity penix understanding of the findings of todays visit and agrees with plan of treatment. I have discussed any further diagnostic evaluation that may be needed or ordered today. We also reviewed her medications today. she has been encouraged to call the office with any questions or concerns that should arise related to todays visit.    Orders Placed This Encounter  Procedures   Hgb A1C w/o eAG   Hepatic function panel   Vitamin D  (25 hydroxy)   Lipid Profile   Ambulatory referral to Orthopedic Surgery    No orders of the defined types were placed in this encounter.   Return in about 4 weeks (around 04/25/2024) for F/U, Labs, Shantina Chronister PCP.   Total time spent:30 Minutes Time spent includes review of chart, medications, test results, and follow up plan with the patient.   [place] Controlled Substance Database was reviewed by me.  This patient was seen by Mardy Maxin, FNP-C in collaboration with Dr. Sigrid Bathe as a part of collaborative care agreement.  Daeshawn Redmann R. Maxin, MSN, FNP-C Internal medicine

## 2024-03-31 ENCOUNTER — Telehealth: Payer: Self-pay | Admitting: Nurse Practitioner

## 2024-03-31 ENCOUNTER — Ambulatory Visit: Admitting: Internal Medicine

## 2024-03-31 NOTE — Telephone Encounter (Signed)
 Orthopedic referral sent via [facility] to [facility]. Telephone # [phone]  Left patient vm-Toni

## 2024-04-03 ENCOUNTER — Telehealth: Payer: Self-pay | Admitting: Nurse Practitioner

## 2024-04-03 NOTE — Telephone Encounter (Signed)
 Orthopedic appointment was 04/02/2024 with [facility]

## 2024-04-07 ENCOUNTER — Telehealth: Payer: Self-pay | Admitting: Internal Medicine

## 2024-04-07 ENCOUNTER — Ambulatory Visit: Admitting: Internal Medicine

## 2024-04-07 ENCOUNTER — Encounter: Payer: Self-pay | Admitting: Internal Medicine

## 2024-04-07 VITALS — BP 140/60 | HR 86 | Temp 98.0°F | Resp 16 | Ht 61.5 in | Wt 150.6 lb

## 2024-04-07 DIAGNOSIS — G4733 Obstructive sleep apnea (adult) (pediatric): Secondary | ICD-10-CM

## 2024-04-07 DIAGNOSIS — B91 Sequelae of poliomyelitis: Secondary | ICD-10-CM

## 2024-04-07 DIAGNOSIS — M6281 Muscle weakness (generalized): Secondary | ICD-10-CM

## 2024-04-07 NOTE — Progress Notes (Signed)
 [facility] [address] [place], [place] [address]  Pulmonary Sleep Medicine   Office Visit Note  Patient Name: Tiffany Hawkins DOB: Apr 03, 1946 MRN [id number]  Date of Service: 04/07/2024  Complaints/HPI: She has been doing well. She has been using the CPAP and has excellent compliance. She states she sleeps for almost 9h every night. Patient has no issues with the machine and states her mask is leaking and needs a new one. Her AHI of 1.7 per hour. She is overall well controlled. Her BMI is 27.99 and she notes she had been down to 140 pounds will try to work on it after [employer] Results:     ROS  General: (-) fever, (-) chills, (-) night sweats, (-) weakness Skin: (-) rashes, (-) itching,. Eyes: (-) visual changes, (-) redness, (-) itching. Nose and Sinuses: (-) nasal stuffiness or itchiness, (-) postnasal drip, (-) nosebleeds, (-) sinus trouble. Mouth and Throat: (-) sore throat, (-) hoarseness. Neck: (-) swollen glands, (-) enlarged thyroid , (-) neck pain. Respiratory: - cough, (-) bloody sputum, - shortness of breath, - wheezing. Cardiovascular: - ankle swelling, (-) chest pain. Lymphatic: (-) lymph node enlargement. Neurologic: (-) numbness, (-) tingling. Psychiatric: (-) anxiety, (-) depression   Current Medication: Outpatient Encounter Medications as of 04/07/2024  Medication Sig   Blood Glucose Monitoring Suppl (TRUE METRIX METER) w/Device KIT Use as directed DxE11.65   cetirizine  (ZYRTEC ) 10 MG chewable tablet Chew 1 tablet (10 mg total) by mouth daily. (Patient taking differently: Chew 10 mg by mouth daily as needed.)   citalopram  (CELEXA ) 40 MG tablet TAKE 1 TABLET EVERY DAY   cyclobenzaprine  (FLEXERIL ) 5 MG tablet Take 1 tablet (5 mg total) by mouth 3 (three) times daily as needed for muscle spasms.   dapagliflozin  propanediol (FARXIGA ) 10 MG TABS tablet Take 1 tablet (10 mg total) by mouth daily.   fenofibrate  54 MG tablet TAKE  1 TABLET EVERY DAY   fluticasone  (FLONASE ) 50 MCG/ACT nasal spray Place 2 sprays into both nostrils daily.   glucose blood (TRUE METRIX BLOOD GLUCOSE TEST) test strip TEST BLOOD SUGAR THREE TIMES DAILY AFTER MEALS   insulin  aspart protamine - aspart (NOVOLOG  MIX 70/30 FLEXPEN) (70-30) 100 UNIT/ML FlexPen Inject 35 units [place] QAM and inject 40units [place] QPM   losartan (COZAAR) 25 MG tablet Take 25 mg by mouth daily.   lovastatin  (MEVACOR ) 20 MG tablet TAKE 1 TABLET AT BEDTIME FOR HIGH CHOLESTEROL   MOUNJARO  2.5 MG/0.5ML Pen INJECT 2.5 MG INTO THE SKIN ONCE A WEEK.   Multiple Vitamin (MULTIVITAMIN) tablet Take by mouth daily. TAKES 1/2 TABLET   ondansetron  (ZOFRAN ) 4 MG tablet Take 1 tablet (4 mg total) by mouth every 8 (eight) hours as needed for nausea or vomiting.   pantoprazole  (PROTONIX ) 20 MG tablet TAKE 2 TABLETS EVERY DAY   sharps container 1 each by Does not apply route as needed. To use for insulin  syringes and needles. Taking insulin  at least twice daily.  E11.65   spironolactone (ALDACTONE) 100 MG tablet Take 100 mg by mouth daily. 2 tablets once a day   TRUEplus Lancets 33G MISC TEST BLOOD SUGAR THREE TIMES DAILY AS DIRECTED   No facility-administered encounter medications on file as of 04/07/2024.    Surgical History: Past Surgical History:  Procedure Laterality Date   APPENDECTOMY     BACK SURGERY     CATARACT EXTRACTION W/PHACO Left 12/03/2017   Procedure: CATARACT EXTRACTION PHACO AND INTRAOCULAR LENS PLACEMENT (IOC) LEFT  DIABETIC;  Surgeon: Myrna Adine Anes, MD;  Location: [facility];  Service: Ophthalmology;  Laterality: Left;  Diabetic - insulin    CATARACT EXTRACTION W/PHACO Right 01/14/2018   Procedure: CATARACT EXTRACTION PHACO AND INTRAOCULAR LENS PLACEMENT (IOC) RIGHT DIABETIC;  Surgeon: Myrna Adine Anes, MD;  Location: [facility];  Service: Ophthalmology;  Laterality: Right;  Diabetic - insulin    CERVICAL DISC SURGERY     CHOLECYSTECTOMY     COLON  SURGERY     COLONOSCOPY     COLONOSCOPY N/A 07/03/2023   Procedure: COLONOSCOPY;  Surgeon: Unk Corinn Skiff, MD;  Location: [facility];  Service: Endoscopy;  Laterality: N/A;  Diabetic   COLONOSCOPY WITH PROPOFOL  N/A 05/17/2015   Procedure: COLONOSCOPY WITH PROPOFOL ;  Surgeon: Lamar ONEIDA Holmes, MD;  Location: [facility] END[facility]COPY;  Service: Endoscopy;  Laterality: N/A;   COLONOSCOPY WITH PROPOFOL  N/A 04/01/2018   Procedure: COLONOSCOPY WITH BIOPSY;  Surgeon: Jinny Carmine, MD;  Location: [facility];  Service: Endoscopy;  Laterality: N/A;  Diabetic - insulin    EMBOLIZATION (CATH LAB) N/A 01/24/2021   Procedure: EMBOLIZATION;  Surgeon: Marea Selinda RAMAN, MD;  Location: [facility] INVASIVE CV LAB;  Service: Cardiovascular;  Laterality: N/A;   ESOPHAGOGASTRODUODENOSCOPY     HERNIA REPAIR     LOWER EXTREMITY VENOGRAPHY Left 01/24/2021   Procedure: LOWER EXTREMITY VENOGRAPHY;  Surgeon: Marea Selinda RAMAN, MD;  Location: [facility] INVASIVE CV LAB;  Service: Cardiovascular;  Laterality: Left;   POLYPECTOMY N/A 04/01/2018   Procedure: POLYPECTOMY;  Surgeon: Jinny Carmine, MD;  Location: [facility];  Service: Endoscopy;  Laterality: N/A;   POLYPECTOMY  07/03/2023   Procedure: POLYPECTOMY;  Surgeon: Unk Corinn Skiff, MD;  Location: [facility];  Service: Endoscopy;;   PORT A CATH INJECTION ([facility] HX)     Port has been removed   TUBAL LIGATION      Medical History: Past Medical History:  Diagnosis Date   Anxiety    Atherosclerosis of aorta    Colon cancer (HCC) 10/12/2014   Stage IIB; had chemo   Diabetes mellitus without complication (HCC)    Hyperlipidemia    Hypertension    Intractable episodic headache    Lateral epicondylitis    OSA on CPAP    OSA on CPAP    Personal history of chemotherapy    Polio    childhood   Skin cancer    Sqamous Cell, Lt Calf   Sleep apnea    CPAP   Uncontrolled diabetes mellitus with hyperglycemia (HCC)     Family History: Family History   Problem Relation Age of Onset   Alzheimer's disease Mother    Heart disease Father    Colon cancer Cousin    Colon cancer Cousin    Cancer Cousin        Brain Tumor   Breast cancer Neg Hx     Social History: Social History   Socioeconomic History   Marital status: Married    Spouse name: Not on file   Number of children: Not on file   Years of education: Not on file   Highest education level: Not on file  Occupational History   Not on file  Tobacco Use   Smoking status: Former    Current packs/day: 0.00    Average packs/day: 1.5 packs/day for 20.0 years (30.0 ttl pk-yrs)    Types: Cigarettes    Start date: 04/17/1961    Quit date: 04/17/1981    Years since quitting: 43.0   Smokeless  tobacco: Never  Vaping Use   Vaping status: Never Used  Substance and Sexual Activity   Alcohol use: No   Drug use: No   Sexual activity: Yes  Other Topics Concern   Not on file  Social History Narrative   Not on file   Social Drivers of Health   Tobacco Use: Medium Risk (04/07/2024)   Patient History    Smoking Tobacco Use: Former    Smokeless Tobacco Use: Never    Passive Exposure: Not on file  Financial Resource Strain: Low Risk (06/22/2021)   Overall Financial Resource Strain (CARDIA)    Difficulty of Paying Living Expenses: Not hard at all  Food Insecurity: Not on file  Transportation Needs: No Transportation Needs (06/22/2021)   PRAPARE - Administrator, Civil Service (Medical): No    Lack of Transportation (Non-Medical): No  Physical Activity: Not on file  Stress: Not on file  Social Connections: Not on file  Intimate Partner Violence: Not on file  Depression [phone]): Low Risk (03/28/2024)   Depression (PHQ2-9)    PHQ-2 Score: 0  Alcohol Screen: Low Risk (11/09/2021)   Alcohol Screen    Last Alcohol Screening Score (AUDIT): 0  Housing: Low Risk (06/22/2021)   Housing    Last Housing Risk Score: 0  Utilities: Not on file  Health Literacy: Not on file     Vital Signs: Blood pressure (!) 140/60, pulse 86, temperature 98 F (36.7 C), resp. rate 16, height 5' 1.5 (1.562 m), weight 150 lb 9.6 oz (68.3 kg), SpO2 96%.  Examination: General Appearance: The patient is well-developed, well-nourished, and in no distress. Skin: Gross inspection of skin unremarkable. Head: normocephalic, no gross deformities. Eyes: no gross deformities noted. ENT: ears appear grossly normal no exudates. Neck: Supple. No thyromegaly. No LAD. Respiratory: no rhocnhi noted. Cardiovascular: Normal S1 and S2 without murmur or rub. Extremities: No cyanosis. pulses are equal. Neurologic: Alert and oriented. No involuntary movements.  LABS: No results found for this or any previous visit (from the past 2160 hours).  Radiology: US  RENAL Result Date: 10/28/2023 CLINICAL DATA:  Stage III B chronic kidney disease EXAM: RENAL / URINARY TRACT ULTRASOUND COMPLETE COMPARISON:  01/13/2019 FINDINGS: Right Kidney: Renal measurements: 11.0 x 4.8 x 5.2 cm = volume: 143 mL. Echogenicity within normal limits. No mass or hydronephrosis visualized. 3 cm simple exophytic renal cyst is seen arising from the lower pole of the right kidney for which no follow-up imaging is recommended. Left Kidney: Renal measurements: 11.6 x 5.7 x 3.8 cm = volume: 131 mL. Echogenicity within normal limits. No mass or hydronephrosis visualized. Multiple simple exophytic cortical cysts are seen involving the left kidney, the largest within the upper pole measuring 2.7 cm. No follow-up imaging is recommended for these lesions. Bladder: Appears normal for degree of bladder distention. Other: None. IMPRESSION: 1. Normal renal sonogram. Electronically Signed   By: Dorethia Molt M.D.   On: 10/28/2023 02:02    No results found.  No results found.  Assessment and Plan: Patient Active Problem List   Diagnosis Date Noted   Microalbuminuria due to type 2 diabetes mellitus (HCC) 03/28/2024   Post-polio limb muscle  weakness 03/28/2024   Chronic midline low back pain without sciatica 03/28/2024   Mixed stress and urge urinary incontinence 08/29/2023   Stage 3b chronic kidney disease (HCC) 07/30/2023   Hx of colonic polyps 07/03/2023   Polyp of descending colon 07/03/2023   Polyp of transverse colon 07/03/2023   Atherosclerosis  of aorta 12/29/2020   Deformity of toe of left foot 03/28/2020   H/O multiple pulmonary nodules 09/22/2019   Chronic cough 09/22/2019   Perennial allergic rhinitis 09/17/2019   Acute upper respiratory infection 08/28/2019   Weakness 08/28/2019   Acute bronchitis 06/01/2019   Cough productive of yellow sputum 06/01/2019   Vasomotor rhinitis 05/01/2019   Chronic kidney disease, stage II (mild) 03/14/2019   Gastroesophageal reflux disease without esophagitis 03/14/2019   Intractable episodic headache 03/14/2019   Need for prophylactic vaccination with combined diphtheria-tetanus-pertussis (DTaP) vaccine 03/14/2019   Renal cyst 11/27/2018   Abnormality of gait and mobility 11/27/2018   Hepatic steatosis 10/31/2018   Elevated ferritin 10/31/2018   Vertigo 08/13/2018   Gastroenteritis 05/09/2018   Nausea 05/09/2018   Personal history of colon cancer    Polyp of sigmoid colon    Dysuria 02/23/2018   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 02/10/2018   Encounter for general adult medical examination with abnormal findings 02/10/2018   Acute non-recurrent pansinusitis 08/24/2017   Other headache syndrome 08/24/2017   Uncontrolled type 2 diabetes mellitus with hyperglycemia (HCC) 08/24/2017   Type 2 diabetes mellitus with stage 3b chronic kidney disease, with long-term current use of insulin  (HCC) 08/22/2017   Need for vaccination against Streptococcus pneumoniae using pneumococcal conjugate vaccine 13 08/22/2017   Hypertension associated with type 2 diabetes mellitus (HCC) 05/03/2017   Otalgia, bilateral 05/02/2017   Type 2 diabetes mellitus with hyperglycemia (HCC)  05/02/2017   Hyperlipidemia associated with type 2 diabetes mellitus (HCC) 05/02/2017   Right lower quadrant pain 06/29/2016   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 OSA (obstructive sleep apnea) (Primary) On CPAP patient is doing well overall. Patient will continue with her current pressures.   2. Obesity, morbid (HCC) Exrecise diet discussed  3. Post-polio limb muscle weakness Work on PT and consider small weight exercises   General Counseling: I have discussed the findings of the evaluation and examination with Tiffany Hawkins.  I have also discussed any further diagnostic evaluation thatmay be needed or ordered today. Tiffany Hawkins verbalizes understanding of the findings of todays visit. We also reviewed her medications today and discussed drug interactions and side effects including but not limited excessive drowsiness and altered mental states. We also discussed that there is always a risk not just to her but also people around her. she has been encouraged to call the office with any questions or concerns that should arise related to todays visit.  Orders Placed This Encounter  Procedures   For home use only DME continuous positive airway pressure (CPAP)    Length of Need:   Lifetime    Patient has OSA or probable OSA:   Yes    Is the patient currently using CPAP in the home:   Yes    If yes (to question two):   Determine DME provider and inform them of any new orders/settings    Settings:   Autotitration    Signs and symptoms of probable OSA  (select all that apply):   Snoring    CPAP supplies needed:   Mask, headgear, cushions, filters, heated tubing and water  chamber     Time spent: 45  I have personally obtained a history, examined the patient, evaluated laboratory and imaging results, formulated the assessment and plan and placed orders.    Tiffany Hawkins Tiffany Bathe, MD [facility] Pulmonary and Critical Care Sleep medicine

## 2024-04-07 NOTE — Telephone Encounter (Signed)
 Notified Thomasina Adine Hawks & Avelina with [employer] of cpap order-Toni

## 2024-04-07 NOTE — Patient Instructions (Signed)

## 2024-04-08 ENCOUNTER — Telehealth: Payer: Self-pay | Admitting: Internal Medicine

## 2024-04-08 NOTE — Telephone Encounter (Signed)
 Manual Cpap supply order with pressure faxed to [facility]; [phone]. Scanned-Toni

## 2024-04-15 ENCOUNTER — Ambulatory Visit: Payer: Self-pay | Admitting: Nurse Practitioner

## 2024-04-15 LAB — HEPATIC FUNCTION PANEL
ALT: 21 IU/L (ref 0–32)
AST: 17 IU/L (ref 0–40)
Albumin: 4.3 g/dL (ref 3.8–4.8)
Alkaline Phosphatase: 46 IU/L — ABNORMAL LOW (ref 49–135)
Bilirubin Total: 0.2 mg/dL (ref 0.0–1.2)
Bilirubin, Direct: 0.08 mg/dL (ref 0.00–0.40)
Total Protein: 6.4 g/dL (ref 6.0–8.5)

## 2024-04-15 LAB — LIPID PANEL
Chol/HDL Ratio: 4.3 ratio (ref 0.0–4.4)
Cholesterol, Total: 154 mg/dL (ref 100–199)
HDL: 36 mg/dL — ABNORMAL LOW
LDL Chol Calc (NIH): 85 mg/dL (ref 0–99)
Triglycerides: 195 mg/dL — ABNORMAL HIGH (ref 0–149)
VLDL Cholesterol Cal: 33 mg/dL (ref 5–40)

## 2024-04-15 LAB — VITAMIN D 25 HYDROXY (VIT D DEFICIENCY, FRACTURES): Vit D, 25-Hydroxy: 31.6 ng/mL (ref 30.0–100.0)

## 2024-04-15 LAB — HGB A1C W/O EAG: Hgb A1c MFr Bld: 6 % — ABNORMAL HIGH (ref 4.8–5.6)

## 2024-04-15 NOTE — Progress Notes (Signed)
 The labs have been reviewed and will be discussed with the patient at her upcoming appointment.

## 2024-04-21 ENCOUNTER — Ambulatory Visit: Attending: Physician Assistant | Admitting: Physical Therapy

## 2024-04-21 ENCOUNTER — Encounter: Payer: Self-pay | Admitting: Physical Therapy

## 2024-04-21 DIAGNOSIS — R2689 Other abnormalities of gait and mobility: Secondary | ICD-10-CM | POA: Diagnosis present

## 2024-04-21 DIAGNOSIS — R269 Unspecified abnormalities of gait and mobility: Secondary | ICD-10-CM | POA: Insufficient documentation

## 2024-04-21 DIAGNOSIS — Z9181 History of falling: Secondary | ICD-10-CM | POA: Diagnosis present

## 2024-04-21 DIAGNOSIS — M6281 Muscle weakness (generalized): Secondary | ICD-10-CM | POA: Insufficient documentation

## 2024-04-21 NOTE — Therapy (Signed)
 " OUTPATIENT PHYSICAL THERAPY LOWER EXTREMITY EVALUATION   Patient Name: Tiffany Hawkins MRN: [id number] DOB:[birth year], 79 y.o., female Today's Date: 04/21/2024  END OF SESSION:  PT End of Session - 04/21/24 1417     Visit Number 1    Number of Visits 12    Date for Recertification  06/02/24    PT Start Time 1301    PT Stop Time 1402    PT Time Calculation (min) 61 min    Equipment Utilized During Treatment Gait belt    Activity Tolerance Patient tolerated treatment well;Patient limited by fatigue;Patient limited by pain    Behavior During Therapy WFL for tasks assessed/performed          Past Medical History:  Diagnosis Date   Anxiety    Atherosclerosis of aorta    Colon cancer (HCC) 10/12/2014   Stage IIB; had chemo   Diabetes mellitus without complication (HCC)    Hyperlipidemia    Hypertension    Intractable episodic headache    Lateral epicondylitis    OSA on CPAP    OSA on CPAP    Personal history of chemotherapy    Polio    childhood   Skin cancer    Sqamous Cell, Lt Calf   Sleep apnea    CPAP   Uncontrolled diabetes mellitus with hyperglycemia [facility])    Past Surgical History:  Procedure Laterality Date   APPENDECTOMY     BACK SURGERY     CATARACT EXTRACTION W/PHACO Left 12/03/2017   Procedure: CATARACT EXTRACTION PHACO AND INTRAOCULAR LENS PLACEMENT (IOC) LEFT  DIABETIC;  Surgeon: Myrna Adine Anes, MD;  Location: [facility]GERY [facility];  Service: Ophthalmology;  Laterality: Left;  Diabetic - insulin    CATARACT EXTRACTION W/PHACO Right 01/14/2018   Procedure: CATARACT EXTRACTION PHACO AND INTRAOCULAR LENS PLACEMENT (IOC) RIGHT DIABETIC;  Surgeon: Myrna Adine Anes, MD;  Location: [facility] SURG[facility];  Service: Ophthalmology;  Laterality: Right;  Diabetic - insulin    CERVICAL DISC SURGERY     CHOLECYSTECTOMY     COLON SURGERY     COLONOSCOPY     COLONOSCOPY N/A 07/03/2023   Procedure: COLONOSCOPY;  Surgeon: Unk Corinn Skiff, MD;  Location: [facility];  Service: Endoscopy;  Laterality: N/A;  Diabetic   COLONOSCOPY WITH PROPOFOL  N/A 05/17/2015   Procedure: COLONOSCOPY WITH PROPOFOL ;  Surgeon: Lamar ONEIDA Holmes, MD;  Location: [facility] ENDOSCOPY;  Service: Endoscopy;  Laterality: N/A;   COLONOSCOPY WITH PROPOFOL  N/A 04/01/2018   Procedure: COLONOSCOPY WITH BIOPSY;  Surgeon: Jinny Carmine, MD;  Location: [facility] SURGERY [facility]TR;  Service: Endoscopy;  Laterality: N/A;  Diabetic - insulin    EMBOLIZATION (CATH LAB) N/A 01/24/2021   Procedure: EMBOLIZATION;  Surgeon: Marea Selinda RAMAN, MD;  Location: [facility] INVASIVE CV LAB;  Service: Cardiovascular;  Laterality: N/A;   ESOPHAGOGASTRODUODENOSCOPY     HERNIA REPAIR     LOWER EXTREMITY VENOGRAPHY Left 01/24/2021   Procedure: LOWER EXTREMITY VENOGRAPHY;  Surgeon: Marea Selinda RAMAN, MD;  Location: [facility] INVASIVE CV LAB;  Service: Cardiovascular;  Laterality: Left;   POLYPECTOMY N/A 04/01/2018   Procedure: POLYPECTOMY;  Surgeon: Jinny Carmine, MD;  Location: [facility] SURGERY [facility];  Service: Endoscopy;  Laterality: N/A;   POLYPECTOMY  07/03/2023   Procedure: POLYPECTOMY;  Surgeon: Unk Corinn Skiff, MD;  Location: [facility] SURGERY [facility];  Service: Endoscopy;;   PORT A CATH INJECTION ([facility] HX)     Port has been removed   TUBAL LIGATION     Patient Active Problem List   Diagnosis Date  Noted   Microalbuminuria due to type 2 diabetes mellitus (HCC) 03/28/2024   Post-polio limb muscle weakness 03/28/2024   Chronic midline low back pain without sciatica 03/28/2024   Mixed stress and urge urinary incontinence 08/29/2023   Stage 3b chronic kidney disease (HCC) 07/30/2023   Hx of colonic polyps 07/03/2023   Polyp of descending colon 07/03/2023   Polyp of transverse colon 07/03/2023   Atherosclerosis of aorta 12/29/2020   Deformity of toe of left foot 03/28/2020   H/O multiple pulmonary nodules 09/22/2019   Chronic cough 09/22/2019   Perennial allergic rhinitis 09/17/2019   Acute upper respiratory infection 08/28/2019    Weakness 08/28/2019   Acute bronchitis 06/01/2019   Cough productive of yellow sputum 06/01/2019   Vasomotor rhinitis 05/01/2019   Chronic kidney disease, stage II (mild) 03/14/2019   Gastroesophageal reflux disease without esophagitis 03/14/2019   Intractable episodic headache 03/14/2019   Need for prophylactic vaccination with combined diphtheria-tetanus-pertussis (DTaP) vaccine 03/14/2019   Renal cyst 11/27/2018   Abnormality of gait and mobility 11/27/2018   Hepatic steatosis 10/31/2018   Elevated ferritin 10/31/2018   Vertigo 08/13/2018   Gastroenteritis 05/09/2018   Nausea 05/09/2018   Personal history of colon cancer    Polyp of sigmoid colon    Dysuria 02/23/2018   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 02/10/2018   Encounter for general adult medical examination with abnormal findings 02/10/2018   Acute non-recurrent pansinusitis 08/24/2017   Other headache syndrome 08/24/2017   Uncontrolled type 2 diabetes mellitus with hyperglycemia (HCC) 08/24/2017   Type 2 diabetes mellitus with stage 3b chronic kidney disease, with long-term current use of insulin  (HCC) 08/22/2017   Need for vaccination against Streptococcus pneumoniae using pneumococcal conjugate vaccine 13 08/22/2017   Hypertension associated with type 2 diabetes mellitus (HCC) 05/03/2017   Otalgia, bilateral 05/02/2017   Type 2 diabetes mellitus with hyperglycemia (HCC) 05/02/2017   Hyperlipidemia associated with type 2 diabetes mellitus (HCC) 05/02/2017   Right lower quadrant pain 06/29/2016   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   History of colon cancer 04/13/2015   PCP: Liana Fish, NP  REFERRING PROVIDER: [name], PA-C  REFERRING DIAG:  Diagnosis  R53.1 (ICD-10-CM) - Weakness  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 Is well known to PT clinic.  Pt. Reports no pain and states she has weakness in both legs and is not moving well.  Pt. Reports some falls but more near falls requiring UE assist on walls/ furniture.  Pt. Currently walking inside/ outside without use of assistive device.  Pt. has RW at home but not using.  Pt. Has shower chair/ grab bars.  Pt. States R leg is stronger than L leg.  Ascends stairs with L LE at church.   PERTINENT HISTORY: See MD note.  R hand dominant.    PAIN:  Are you having pain? No.   Pt. Reports increase in B knee pain with floor to waist pick up during Berg.    PRECAUTIONS: Fall  RED FLAGS: None   WEIGHT BEARING RESTRICTIONS: No  FALLS:  Has patient fallen in last 6 months? Yes. Number of falls no number given with most recent fall in bathroom while cleaning toilet.    LIVING ENVIRONMENT: Lives with: lives  with their spouse Lives in: House/apartment Stairs: Yes: External: 2 steps; can reach both Has following equipment at home: Single point cane, Walker - 2 wheeled, shower chair, and Grab bars  OCCUPATION: Retired  PLOF: Needs assistance with ADLs  PATIENT GOALS: wants to walk like I am feeling I will not fall.   Hold her great grand kids while standing.    NEXT MD VISIT: PRN  OBJECTIVE:  Note: Objective measures were completed at Evaluation unless otherwise noted.  PATIENT SURVEYS:  LEFS:  23 out of 80 ABC:  61%  COGNITION: Overall cognitive status: Within functional limits for tasks assessed     SENSATION: Light touch: Impaired  (diminished sensation in R S2 dermatome).  Diminished L achilles reflex.    EDEMA:  Not assessed  MUSCLE LENGTH: Hamstrings: NT Thomas test: NT  POSTURE: rounded shoulders and forward head  PALPATION: NT  LOWER EXTREMITY ROM:  B LE AROM WFL  LOWER EXTREMITY MMT:  MMT Right eval Left eval  Hip flexion  3+ 3+  Hip extension    Hip abduction 3 3  Hip adduction    Hip internal rotation    Hip external rotation    Knee flexion 3+ 3+  Knee extension 4 4  Ankle dorsiflexion 5 4  Ankle plantarflexion    Ankle inversion    Ankle eversion     (Blank rows = not tested)  LOWER EXTREMITY SPECIAL TESTS:  NT  FUNCTIONAL TESTS:  Berg balance test:  27/56  (high fall risk)  GAIT: Distance walked: in clinic Assistive device utilized: None Level of assistance: CGA Comments:  Staggered gait pattern while walking into PT clinic/ hallway without use of assistive device.  Reaches for walls/ //-bars with UE for safety to prevent LOB.                                                                                                  TREATMENT DATE: 04/21/24  See evaluation/  will issue HEP next tx./ PT encouraged pt. To use RW with walking, esp. Outside.      PATIENT EDUCATION:  Education details: Fall risk/ use of Rollator Person educated: Patient and Spouse Education method: Medical Illustrator Education comprehension: verbalized understanding and returned demonstration  HOME EXERCISE PROGRAM: Will issue next tx. Session.    ASSESSMENT:  CLINICAL IMPRESSION: Patient is a pleasant 79 y.o. female who was seen today for physical therapy evaluation and treatment for B LE muscle weakness/ balance issues/ gait difficulty.  Pt. Presents with generalized B LE muscle weakness resulting in staggered gait pattern without use of assistive device.  Pt. Benefits from SBA/CGA and use of RW/rollator with short distance ambulation.  Pt. Has limited standing/ walking endurance and generalized deconditioning.  Pt. Will benefit from skilled PT services to develop exercise program to increase B LE muscle strength to improve balance and safety with functional mobility/ walking with least assistive device.    OBJECTIVE IMPAIRMENTS: Abnormal gait, decreased activity tolerance, decreased balance, decreased  endurance, decreased mobility, difficulty walking, decreased strength, decreased safety awareness, dizziness, impaired perceived functional ability, impaired sensation,  improper body mechanics, and postural dysfunction.   ACTIVITY LIMITATIONS: carrying, lifting, bending, standing, squatting, stairs, transfers, bathing, and locomotion level  PARTICIPATION LIMITATIONS: cleaning, laundry, interpersonal relationship, shopping, community activity, and church  PERSONAL FACTORS: Fitness and Past/current experiences are also affecting patient's functional outcome.   REHAB POTENTIAL: Good  CLINICAL DECISION MAKING: Evolving/moderate complexity  EVALUATION COMPLEXITY: Moderate   GOALS: Goals reviewed with patient? Yes  SHORT TERM GOALS: Target date: 05/12/24 Pt. Independent with HEP to increase B LE muscle strength 1/2 muscle grade to improve standing tolerance/ independence with walking.   Baseline: see above Goal status: INITIAL  2.  Pt. Will consistently use RW/rollator with ambulation to decrease fall risk/ improve safety with walking.   Baseline:  pt. Not using assistive device (hesitant to use).   Goal status: INITIAL  LONG TERM GOALS: Target date: 06/02/24  Pt. Will increase LEFS to >40 out of 80 to improve functional mobility.   Baseline:  23 out of 80 Goal status: INITIAL  2.  Pt. Will increase Berg balance test to >40/56 to improve mobility/ decrease fall risk with least assistive device.  Baseline: 27/56  High fall risk. Goal status: INITIAL  3.  Pt. Will increase ABC to >75% to improve safety with walking/ decrease fall risk.   Baseline:  61% Goal status: INITIAL  PLAN:  PT FREQUENCY: 2x/week  PT DURATION: 6 weeks  PLANNED INTERVENTIONS: 97110-Therapeutic exercises, 97530- Therapeutic activity, [id number]- Neuromuscular re-education, 97535- Self Care, [id number]- Manual therapy, [phone]- Gait training, Patient/Family education, Balance training, Stair training, Cryotherapy, and  Moist heat  PLAN FOR NEXT SESSION: Issue HEP   Ozell JAYSON Sero, PT, DPT # [phone] 04/21/2024, 2:19 PM  "

## 2024-04-23 ENCOUNTER — Ambulatory Visit: Admitting: Physical Therapy

## 2024-04-23 ENCOUNTER — Encounter: Payer: Self-pay | Admitting: Physical Therapy

## 2024-04-23 DIAGNOSIS — M6281 Muscle weakness (generalized): Secondary | ICD-10-CM

## 2024-04-23 DIAGNOSIS — Z9181 History of falling: Secondary | ICD-10-CM

## 2024-04-23 DIAGNOSIS — R2689 Other abnormalities of gait and mobility: Secondary | ICD-10-CM

## 2024-04-23 DIAGNOSIS — R269 Unspecified abnormalities of gait and mobility: Secondary | ICD-10-CM

## 2024-04-23 NOTE — Therapy (Signed)
 " OUTPATIENT PHYSICAL THERAPY LOWER EXTREMITY TREATMENT   Patient Name: Tiffany Hawkins MRN: [id number] DOB:October 05, 1945, 79 y.o., female Today's Date: 04/23/2024  END OF SESSION:  PT End of Session - 04/23/24 1823     Visit Number 2    Number of Visits 12    Date for Recertification  06/02/24    PT Start Time 1259    PT Stop Time 1349    PT Time Calculation (min) 50 min    Equipment Utilized During Treatment Gait belt    Activity Tolerance Patient tolerated treatment well;Patient limited by fatigue;Patient limited by pain    Behavior During Therapy WFL for tasks assessed/performed          Past Medical History:  Diagnosis Date   Anxiety    Atherosclerosis of aorta    Colon cancer (HCC) 10/12/2014   Stage IIB; had chemo   Diabetes mellitus without complication (HCC)    Hyperlipidemia    Hypertension    Intractable episodic headache    Lateral epicondylitis    OSA on CPAP    OSA on CPAP    Personal history of chemotherapy    Polio    childhood   Skin cancer    Sqamous Cell, Lt Calf   Sleep apnea    CPAP   Uncontrolled diabetes mellitus with hyperglycemia [facility])    Past Surgical History:  Procedure Laterality Date   APPENDECTOMY     BACK SURGERY     CATARACT EXTRACTION W/PHACO Left 12/03/2017   Procedure: CATARACT EXTRACTION PHACO AND INTRAOCULAR LENS PLACEMENT (IOC) LEFT  DIABETIC;  Surgeon: Myrna Adine Anes, MD;  Location: [facility] SURGERY CNTR;  Service: Ophthalmology;  Laterality: Left;  Diabetic - insulin    CATARACT EXTRACTION W/PHACO Right 01/14/2018   Procedure: CATARACT EXTRACTION PHACO AND INTRAOCULAR LENS PLACEMENT (IOC) RIGHT DIABETIC;  Surgeon: Myrna Adine Anes, MD;  Location: [facility] SURGERY CNTR;  Service: Ophthalmology;  Laterality: Right;  Diabetic - insulin    CERVICAL DISC SURGERY     CHOLECYSTECTOMY     COLON SURGERY     COLONOSCOPY     COLONOSCOPY N/A 07/03/2023   Procedure: COLONOSCOPY;  Surgeon: Unk Corinn Skiff, MD;  Location: [facility] SURGERY  CNTR;  Service: Endoscopy;  Laterality: N/A;  Diabetic   COLONOSCOPY WITH PROPOFOL  N/A 05/17/2015   Procedure: COLONOSCOPY WITH PROPOFOL ;  Surgeon: Lamar ONEIDA Holmes, MD;  Location: [facility] ENDOSCOPY;  Service: Endoscopy;  Laterality: N/A;   COLONOSCOPY WITH PROPOFOL  N/A 04/01/2018   Procedure: COLONOSCOPY WITH BIOPSY;  Surgeon: Jinny Carmine, MD;  Location: [facility] SURGERY CNTR;  Service: Endoscopy;  Laterality: N/A;  Diabetic - insulin    EMBOLIZATION (CATH LAB) N/A 01/24/2021   Procedure: EMBOLIZATION;  Surgeon: Marea Selinda RAMAN, MD;  Location: [facility] INVASIVE CV LAB;  Service: Cardiovascular;  Laterality: N/A;   ESOPHAGOGASTRODUODENOSCOPY     HERNIA REPAIR     LOWER EXTREMITY VENOGRAPHY Left 01/24/2021   Procedure: LOWER EXTREMITY VENOGRAPHY;  Surgeon: Marea Selinda RAMAN, MD;  Location: [facility] INVASIVE CV LAB;  Service: Cardiovascular;  Laterality: Left;   POLYPECTOMY N/A 04/01/2018   Procedure: POLYPECTOMY;  Surgeon: Jinny Carmine, MD;  Location: [facility] SURGERY CNTR;  Service: Endoscopy;  Laterality: N/A;   POLYPECTOMY  07/03/2023   Procedure: POLYPECTOMY;  Surgeon: Unk Corinn Skiff, MD;  Location: [facility] SURGERY CNTR;  Service: Endoscopy;;   PORT A CATH INJECTION ([facility] HX)     Port has been removed   TUBAL LIGATION     Patient Active Problem List   Diagnosis Date  Noted   Microalbuminuria due to type 2 diabetes mellitus (HCC) 03/28/2024   Post-polio limb muscle weakness 03/28/2024   Chronic midline low back pain without sciatica 03/28/2024   Mixed stress and urge urinary incontinence 08/29/2023   Stage 3b chronic kidney disease (HCC) 07/30/2023   Hx of colonic polyps 07/03/2023   Polyp of descending colon 07/03/2023   Polyp of transverse colon 07/03/2023   Atherosclerosis of aorta 12/29/2020   Deformity of toe of left foot 03/28/2020   H/O multiple pulmonary nodules 09/22/2019   Chronic cough 09/22/2019   Perennial allergic rhinitis 09/17/2019   Acute upper respiratory infection 08/28/2019    Weakness 08/28/2019   Acute bronchitis 06/01/2019   Cough productive of yellow sputum 06/01/2019   Vasomotor rhinitis 05/01/2019   Chronic kidney disease, stage II (mild) 03/14/2019   Gastroesophageal reflux disease without esophagitis 03/14/2019   Intractable episodic headache 03/14/2019   Need for prophylactic vaccination with combined diphtheria-tetanus-pertussis (DTaP) vaccine 03/14/2019   Renal cyst 11/27/2018   Abnormality of gait and mobility 11/27/2018   Hepatic steatosis 10/31/2018   Elevated ferritin 10/31/2018   Vertigo 08/13/2018   Gastroenteritis 05/09/2018   Nausea 05/09/2018   Personal history of colon cancer    Polyp of sigmoid colon    Dysuria 02/23/2018   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 02/10/2018   Encounter for general adult medical examination with abnormal findings 02/10/2018   Acute non-recurrent pansinusitis 08/24/2017   Other headache syndrome 08/24/2017   Uncontrolled type 2 diabetes mellitus with hyperglycemia (HCC) 08/24/2017   Type 2 diabetes mellitus with stage 3b chronic kidney disease, with long-term current use of insulin  (HCC) 08/22/2017   Need for vaccination against Streptococcus pneumoniae using pneumococcal conjugate vaccine 13 08/22/2017   Hypertension associated with type 2 diabetes mellitus (HCC) 05/03/2017   Otalgia, bilateral 05/02/2017   Type 2 diabetes mellitus with hyperglycemia (HCC) 05/02/2017   Hyperlipidemia associated with type 2 diabetes mellitus (HCC) 05/02/2017   Right lower quadrant pain 06/29/2016   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   History of colon cancer 04/13/2015   PCP: Liana Fish, NP  REFERRING PROVIDER: [name], PA-C  REFERRING DIAG:  Diagnosis  R53.1 (ICD-10-CM) - Weakness  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 Is well known to PT clinic.  Pt. Reports no pain and states she has weakness in both legs and is not moving well.  Pt. Reports some falls but more near falls requiring UE assist on walls/ furniture.  Pt. Currently walking inside/ outside without use of assistive device.  Pt. has RW at home but not using.  Pt. Has shower chair/ grab bars.  Pt. States R leg is stronger than L leg.  Ascends stairs with L LE at church.   PERTINENT HISTORY: See MD note.  R hand dominant.    PAIN:  Are you having pain? No.   Pt. Reports increase in B knee pain with floor to waist pick up during Berg.    PRECAUTIONS: Fall  RED FLAGS: None   WEIGHT BEARING RESTRICTIONS: No  FALLS:  Has patient fallen in last 6 months? Yes. Number of falls no number given with most recent fall in bathroom while cleaning toilet.    LIVING ENVIRONMENT: Lives with: lives  with their spouse Lives in: House/apartment Stairs: Yes: External: 2 steps; can reach both Has following equipment at home: Single point cane, Walker - 2 wheeled, shower chair, and Grab bars  OCCUPATION: Retired  PLOF: Needs assistance with ADLs  PATIENT GOALS: wants to walk like I am feeling I will not fall.   Hold her great grand kids while standing.    NEXT MD VISIT: PRN  OBJECTIVE:  Note: Objective measures were completed at Evaluation unless otherwise noted.  PATIENT SURVEYS:  LEFS:  23 out of 80 ABC:  61%  COGNITION: Overall cognitive status: Within functional limits for tasks assessed     SENSATION: Light touch: Impaired  (diminished sensation in R S2 dermatome).  Diminished L achilles reflex.    EDEMA:  Not assessed  MUSCLE LENGTH: Hamstrings: NT Thomas test: NT  POSTURE: rounded shoulders and forward head  PALPATION: NT  LOWER EXTREMITY ROM:  B LE AROM WFL  LOWER EXTREMITY MMT:  MMT Right eval Left eval  Hip flexion  3+ 3+  Hip extension    Hip abduction 3 3  Hip adduction    Hip internal rotation    Hip external rotation    Knee flexion 3+ 3+  Knee extension 4 4  Ankle dorsiflexion 5 4  Ankle plantarflexion    Ankle inversion    Ankle eversion     (Blank rows = not tested)  LOWER EXTREMITY SPECIAL TESTS:  NT  FUNCTIONAL TESTS:  Berg balance test:  27/56  (high fall risk)  GAIT: Distance walked: in clinic Assistive device utilized: None Level of assistance: CGA Comments:  Staggered gait pattern while walking into PT clinic/ hallway without use of assistive device.  Reaches for walls/ //-bars with UE for safety to prevent LOB.                                                                                                  TREATMENT DATE: 04/23/2024  Subjective:  Pt. States she had a fall yesterday while trying to step over a bench outside of house to watch great grand kids play softball/ ride bikes.  Pt. Fell on L side and c/o soreness in L side of neck/ shoulder.  Pt. States she is not too bad and brought RW to PT today.  Pt. Was not using any assistive device while walking outside of house yesterday.    Neuro.mm.:  PT instructed pt. In use of personal RW while walking in clinic.    Walking in //-bars: forward/backwards 5 laps each using blue line to increase BOS.  Moderate cuing for posture correction/ consistent step pattern.  Pt. Prefers to occasionally touch //-bars to check balance.  Challenged with backwards walking.    Walking in //-bars: 3 plinth step overs.  6/12 hurdles requiring 1 UE assist to complete safely.  3 laps with increase fatigue requiring short seated rest break.    Walking in PT gym/ hallway with use of rollator with marked improvement in cadence/ occasional cuing to increase hip flexion to prevent shuffling gait.  Moderate fatigue at end of tx.  Pt. Ambulates  with use of rollator from PT clinic to car.     PATIENT EDUCATION:  Education details: Fall risk/ use  of Rollator Person educated: Patient and Spouse Education method: Medical Illustrator Education comprehension: verbalized understanding and returned demonstration  HOME EXERCISE PROGRAM: Will issue next tx. Session.    ASSESSMENT:  CLINICAL IMPRESSION: Pt. Presents with generalized B LE muscle weakness resulting in staggered gait pattern without use of assistive device.  Pt. Benefits from SBA/CGA and use of RW/rollator with short distance ambulation.  Pt. Has limited standing/ walking endurance and generalized deconditioning.  Moderate fatigue during tx. Session requiring short seated rest breaks.   Pt. Will benefit from skilled PT services to develop exercise program to increase B LE muscle strength to improve balance and safety with functional mobility/ walking with least assistive device.    OBJECTIVE IMPAIRMENTS: Abnormal gait, decreased activity tolerance, decreased balance, decreased endurance, decreased mobility, difficulty walking, decreased strength, decreased safety awareness, dizziness, impaired perceived functional ability, impaired sensation, improper body mechanics, and postural dysfunction.   ACTIVITY LIMITATIONS: carrying, lifting, bending, standing, squatting, stairs, transfers, bathing, and locomotion level  PARTICIPATION LIMITATIONS: cleaning, laundry, interpersonal relationship, shopping, community activity, and church  PERSONAL FACTORS: Fitness and Past/current experiences are also affecting patient's functional outcome.   REHAB POTENTIAL: Good  CLINICAL DECISION MAKING: Evolving/moderate complexity  EVALUATION COMPLEXITY: Moderate   GOALS: Goals reviewed with patient? Yes  SHORT TERM GOALS: Target date: 05/12/24 Pt. Independent with HEP to increase B LE muscle strength 1/2 muscle grade to improve standing tolerance/ independence with walking.   Baseline: see above Goal status: INITIAL  2.  Pt. Will consistently use RW/rollator with ambulation to  decrease fall risk/ improve safety with walking.   Baseline:  pt. Not using assistive device (hesitant to use).   Goal status: INITIAL  LONG TERM GOALS: Target date: 06/02/24  Pt. Will increase LEFS to >40 out of 80 to improve functional mobility.   Baseline:  23 out of 80 Goal status: INITIAL  2.  Pt. Will increase Berg balance test to >40/56 to improve mobility/ decrease fall risk with least assistive device.  Baseline: 27/56  High fall risk. Goal status: INITIAL  3.  Pt. Will increase ABC to >75% to improve safety with walking/ decrease fall risk.   Baseline:  61% Goal status: INITIAL  PLAN:  PT FREQUENCY: 2x/week  PT DURATION: 6 weeks  PLANNED INTERVENTIONS: 97110-Therapeutic exercises, 97530- Therapeutic activity, [id number]- Neuromuscular re-education, 97535- Self Care, [id number]- Manual therapy, [phone]- Gait training, Patient/Family education, Balance training, Stair training, Cryotherapy, and Moist heat  PLAN FOR NEXT SESSION: Issue HEP   Ozell JAYSON Sero, PT, DPT # [phone] 04/23/2024, 6:26 PM  "

## 2024-04-25 ENCOUNTER — Ambulatory Visit: Admitting: Nurse Practitioner

## 2024-04-25 ENCOUNTER — Encounter: Payer: Self-pay | Admitting: Nurse Practitioner

## 2024-04-25 VITALS — BP 136/72 | HR 89 | Temp 97.5°F | Resp 16 | Ht 61.5 in | Wt 153.4 lb

## 2024-04-25 DIAGNOSIS — E1122 Type 2 diabetes mellitus with diabetic chronic kidney disease: Secondary | ICD-10-CM

## 2024-04-25 DIAGNOSIS — I152 Hypertension secondary to endocrine disorders: Secondary | ICD-10-CM

## 2024-04-25 DIAGNOSIS — R7689 Other specified abnormal immunological findings in serum: Secondary | ICD-10-CM | POA: Insufficient documentation

## 2024-04-25 DIAGNOSIS — E785 Hyperlipidemia, unspecified: Secondary | ICD-10-CM

## 2024-04-25 DIAGNOSIS — E1159 Type 2 diabetes mellitus with other circulatory complications: Secondary | ICD-10-CM

## 2024-04-25 DIAGNOSIS — Z794 Long term (current) use of insulin: Secondary | ICD-10-CM | POA: Diagnosis not present

## 2024-04-25 DIAGNOSIS — E1169 Type 2 diabetes mellitus with other specified complication: Secondary | ICD-10-CM | POA: Diagnosis not present

## 2024-04-25 DIAGNOSIS — N1832 Chronic kidney disease, stage 3b: Secondary | ICD-10-CM | POA: Diagnosis not present

## 2024-04-25 DIAGNOSIS — R5382 Chronic fatigue, unspecified: Secondary | ICD-10-CM

## 2024-04-25 NOTE — Progress Notes (Signed)
 [facility] [address] [place], [place] [address]  Internal MEDICINE  Office Visit Note  Patient Name: Tiffany Hawkins  [id number]  [id number]  Date of Service: 04/25/2024  Chief Complaint  Patient presents with   Diabetes   Hyperlipidemia   Hypertension   Follow-up    Review labs    HPI Tiffany Hawkins presents for a follow-up visit for lab results.  Diabetes -- A1c is improved to 6.0 and stable. Vitamin D  is normal  Normal hepatic panel Triglycerides are elevated but improved from prior lab.  Positive ANA last year per her nephrologist but no rheumatology referral or follow up labs. She reports chronic fatigue, joint pains esp back pain. Need additional labs.     Current Medication: Outpatient Encounter Medications as of 04/25/2024  Medication Sig   Blood Glucose Monitoring Suppl (TRUE METRIX METER) w/Device KIT Use as directed DxE11.65   cetirizine  (ZYRTEC ) 10 MG chewable tablet Chew 1 tablet (10 mg total) by mouth daily. (Patient taking differently: Chew 10 mg by mouth daily as needed.)   citalopram  (CELEXA ) 40 MG tablet TAKE 1 TABLET EVERY DAY   cyclobenzaprine  (FLEXERIL ) 5 MG tablet Take 1 tablet (5 mg total) by mouth 3 (three) times daily as needed for muscle spasms. (Patient not taking: Reported on 04/25/2024)   dapagliflozin  propanediol (FARXIGA ) 10 MG TABS tablet Take 1 tablet (10 mg total) by mouth daily.   fenofibrate  54 MG tablet TAKE 1 TABLET EVERY DAY   fluticasone  (FLONASE ) 50 MCG/ACT nasal spray Place 2 sprays into both nostrils daily.   glucose blood (TRUE METRIX BLOOD GLUCOSE TEST) test strip TEST BLOOD SUGAR THREE TIMES DAILY AFTER MEALS   insulin  aspart protamine - aspart (NOVOLOG  MIX 70/30 FLEXPEN) (70-30) 100 UNIT/ML FlexPen Inject 35 units Worth QAM and inject 40units [place] QPM   losartan (COZAAR) 25 MG tablet Take 25 mg by mouth daily.   lovastatin  (MEVACOR ) 20 MG tablet TAKE 1 TABLET AT BEDTIME FOR HIGH CHOLESTEROL   MOUNJARO  2.5 MG/0.5ML Pen INJECT 2.5  MG INTO THE SKIN ONCE A WEEK.   Multiple Vitamin (MULTIVITAMIN) tablet Take by mouth daily. TAKES 1/2 TABLET   ondansetron  (ZOFRAN ) 4 MG tablet Take 1 tablet (4 mg total) by mouth every 8 (eight) hours as needed for nausea or vomiting.   pantoprazole  (PROTONIX ) 20 MG tablet TAKE 2 TABLETS EVERY DAY   sharps container 1 each by Does not apply route as needed. To use for insulin  syringes and needles. Taking insulin  at least twice daily.  E11.65   spironolactone (ALDACTONE) 100 MG tablet Take 100 mg by mouth daily. 2 tablets once a day (Patient not taking: Reported on 04/25/2024)   TRUEplus Lancets 33G MISC TEST BLOOD SUGAR THREE TIMES DAILY AS DIRECTED   No facility-administered encounter medications on file as of 04/25/2024.    Surgical History: Past Surgical History:  Procedure Laterality Date   APPENDECTOMY     BACK SURGERY     CATARACT EXTRACTION W/PHACO Left 12/03/2017   Procedure: CATARACT EXTRACTION PHACO AND INTRAOCULAR LENS PLACEMENT (IOC) LEFT  DIABETIC;  Surgeon: Myrna Adine Anes, MD;  Location: [facility] SURGERY [facility];  Service: Ophthalmology;  Laterality: Left;  Diabetic - insulin    CATARACT EXTRACTION W/PHACO Right 01/14/2018   Procedure: CATARACT EXTRACTION PHACO AND INTRAOCULAR LENS PLACEMENT (IOC) RIGHT DIABETIC;  Surgeon: Myrna Adine Anes, MD;  Location: [facility] SURGERY CNTR;  Service: Ophthalmology;  Laterality: Right;  Diabetic - insulin    CERVICAL DISC SURGERY     CHOLECYSTECTOMY  COLON SURGERY     COLONOSCOPY     COLONOSCOPY N/A 07/03/2023   Procedure: COLONOSCOPY;  Surgeon: Unk Corinn Skiff, MD;  Location: [facility] SURGERY CNTR;  Service: Endoscopy;  Laterality: N/A;  Diabetic   COLONOSCOPY WITH PROPOFOL  N/A 05/17/2015   Procedure: COLONOSCOPY WITH PROPOFOL ;  Surgeon: Lamar ONEIDA Holmes, MD;  Location: [facility] ENDOSCOPY;  Service: Endoscopy;  Laterality: N/A;   COLONOSCOPY WITH PROPOFOL  N/A 04/01/2018   Procedure: COLONOSCOPY WITH BIOPSY;  Surgeon: Jinny Carmine, MD;  Location:  [facility] SURGERY CNTR;  Service: Endoscopy;  Laterality: N/A;  Diabetic - insulin    EMBOLIZATION (CATH LAB) N/A 01/24/2021   Procedure: EMBOLIZATION;  Surgeon: Marea Selinda RAMAN, MD;  Location: [facility] INVASIVE CV LAB;  Service: Cardiovascular;  Laterality: N/A;   ESOPHAGOGASTRODUODENOSCOPY     HERNIA REPAIR     LOWER EXTREMITY VENOGRAPHY Left 01/24/2021   Procedure: LOWER EXTREMITY VENOGRAPHY;  Surgeon: Marea Selinda RAMAN, MD;  Location: [facility] INVASIVE CV LAB;  Service: Cardiovascular;  Laterality: Left;   POLYPECTOMY N/A 04/01/2018   Procedure: POLYPECTOMY;  Surgeon: Jinny Carmine, MD;  Location: [facility] SURGERY CNTR;  Service: Endoscopy;  Laterality: N/A;   POLYPECTOMY  07/03/2023   Procedure: POLYPECTOMY;  Surgeon: Unk Corinn Skiff, MD;  Location: [facility] SURGERY CNTR;  Service: Endoscopy;;   PORT A CATH INJECTION ([facility] HX)     Port has been removed   TUBAL LIGATION      Medical History: Past Medical History:  Diagnosis Date   Anxiety    Atherosclerosis of aorta    Colon cancer (HCC) 10/12/2014   Stage IIB; had chemo   Diabetes mellitus without complication (HCC)    Hyperlipidemia    Hypertension    Intractable episodic headache    Lateral epicondylitis    OSA on CPAP    OSA on CPAP    Personal history of chemotherapy    Polio    childhood   Skin cancer    Sqamous Cell, Lt Calf   Sleep apnea    CPAP   Uncontrolled diabetes mellitus with hyperglycemia (HCC)     Family History: Family History  Problem Relation Age of Onset   Alzheimer's disease Mother    Heart disease Father    Colon cancer Cousin    Colon cancer Cousin    Cancer Cousin        Brain Tumor   Breast cancer Neg Hx     Social History   Socioeconomic History   Marital status: Married    Spouse name: Not on file   Number of children: Not on file   Years of education: Not on file   Highest education level: Not on file  Occupational History   Not on file  Tobacco Use   Smoking status: Former    Current  packs/day: 0.00    Average packs/day: 1.5 packs/day for 20.0 years (30.0 ttl pk-yrs)    Types: Cigarettes    Start date: 04/17/1961    Quit date: 04/17/1981    Years since quitting: 43.0   Smokeless tobacco: Never  Vaping Use   Vaping status: Never Used  Substance and Sexual Activity   Alcohol use: No   Drug use: No   Sexual activity: Yes  Other Topics Concern   Not on file  Social History Narrative   Not on file   Social Drivers of Health   Tobacco Use: Medium Risk (04/25/2024)   Patient History    Smoking Tobacco Use: Former    Smokeless Tobacco Use:  Never    Passive Exposure: Not on file  Financial Resource Strain: Low Risk (06/22/2021)   Overall Financial Resource Strain (CARDIA)    Difficulty of Paying Living Expenses: Not hard at all  Food Insecurity: Not on file  Transportation Needs: No Transportation Needs (06/22/2021)   PRAPARE - Administrator, Civil Service (Medical): No    Lack of Transportation (Non-Medical): No  Physical Activity: Not on file  Stress: Not on file  Social Connections: Not on file  Intimate Partner Violence: Not on file  Depression (PHQ2-9): Low Risk (03/28/2024)   Depression (PHQ2-9)    PHQ-2 Score: 0  Alcohol Screen: Low Risk (11/09/2021)   Alcohol Screen    Last Alcohol Screening Score (AUDIT): 0  Housing: Low Risk (06/22/2021)   Housing    Last Housing Risk Score: 0  Utilities: Not on file  Health Literacy: Not on file      Review of Systems  Respiratory: Negative.  Negative for cough, chest tightness, shortness of breath and wheezing.   Cardiovascular: Negative.  Negative for chest pain and palpitations.  Gastrointestinal: Negative.   Musculoskeletal:  Positive for arthralgias, back pain, gait problem and myalgias.    Vital Signs: BP 136/72   Pulse 89   Temp (!) 97.5 F (36.4 C)   Resp 16   Ht 5' 1.5 (1.562 m)   Wt 153 lb 6.4 oz (69.6 kg)   SpO2 95%   BMI 28.52 kg/m    Physical Exam Vitals reviewed.   Constitutional:      General: She is not in acute distress.    Appearance: Normal appearance. She is not ill-appearing.  HENT:     Head: Normocephalic and atraumatic.  Eyes:     Pupils: Pupils are equal, round, and reactive to light.  Cardiovascular:     Rate and Rhythm: Normal rate and regular rhythm.  Pulmonary:     Effort: Pulmonary effort is normal. No respiratory distress.  Skin:    General: Skin is warm and dry.     Capillary Refill: Capillary refill takes less than 2 seconds.  Neurological:     Mental Status: She is alert and oriented to person, place, and time.  Psychiatric:        Mood and Affect: Mood normal.        Behavior: Behavior normal.        Assessment/Plan: 1. Positive ANA (antinuclear antibody) (Primary) Additional autoimmune labs ordered, will order rheumatology referral pending results.  - ANA Direct w/Reflex if Positive - Sed Rate (ESR) - C-reactive protein - Rheumatoid Factor - HLA-B27 antigen  2. Chronic fatigue Additional autoimmune labs ordered, will order rheumatology referral pending results.  - ANA Direct w/Reflex if Positive - Sed Rate (ESR) - C-reactive protein - Rheumatoid Factor - HLA-B27 antigen  3. Type 2 diabetes mellitus with stage 3b chronic kidney disease, with long-term current use of insulin  (HCC) A1c is 6.0, and stable. Continue medications as prescribed.   4. Hypertension associated with type 2 diabetes mellitus (HCC) Stable, continue medications as prescribed.   5. Hyperlipidemia associated with type 2 diabetes mellitus (HCC) Continue lovastatin  and fenofibrate  as prescribed.   6. Stage 3b chronic kidney disease (HCC) Followed by nephrology   General Counseling: Reena oakland understanding of the findings of todays visit and agrees with plan of treatment. I have discussed any further diagnostic evaluation that may be needed or ordered today. We also reviewed her medications today. she has been encouraged to call  the office with any questions or concerns that should arise related to todays visit.    Orders Placed This Encounter  Procedures   ANA Direct w/Reflex if Positive   Sed Rate (ESR)   C-reactive protein   Rheumatoid Factor   HLA-B27 antigen    No orders of the defined types were placed in this encounter.   Return for F/U, Recheck A1C, Mellony Danziger PCP, no f/u for labs, will refer to rheum pending results. .   Total time spent:30 Minutes Time spent includes review of chart, medications, test results, and follow up plan with the patient.   [place] Controlled Substance Database was reviewed by me.  This patient was seen by Mardy Maxin, FNP-C in collaboration with Dr. Sigrid Bathe as a part of collaborative care agreement.   Ximena Todaro R. Maxin, MSN, FNP-C Internal medicine

## 2024-04-28 ENCOUNTER — Encounter: Payer: Self-pay | Admitting: Physical Therapy

## 2024-04-28 ENCOUNTER — Ambulatory Visit: Admitting: Physical Therapy

## 2024-04-28 DIAGNOSIS — Z9181 History of falling: Secondary | ICD-10-CM

## 2024-04-28 DIAGNOSIS — M6281 Muscle weakness (generalized): Secondary | ICD-10-CM | POA: Diagnosis not present

## 2024-04-28 DIAGNOSIS — R269 Unspecified abnormalities of gait and mobility: Secondary | ICD-10-CM

## 2024-04-28 DIAGNOSIS — R2689 Other abnormalities of gait and mobility: Secondary | ICD-10-CM

## 2024-04-28 NOTE — Patient Instructions (Signed)
 Access Code: [email or web address] URL: [email or web address] Date: 04/28/2024 Prepared by: Ozell Sero  Exercises - Sit to Stand with Counter Support  - 1 x daily - 5 x weekly - 2 sets - 12 reps - Standing March with Counter Support  - 1 x daily - 5 x weekly - 2 sets - 12 reps - Standing Hip Abduction with Counter Support  - 1 x daily - 5 x weekly - 2 sets - 12 reps - Standing Hip Extension with Counter Support  - 1 x daily - 5 x weekly - 2 sets - 12 reps - Supine Bridge  - 1 x daily - 5 x weekly - 2 sets - 12 reps

## 2024-04-28 NOTE — Therapy (Signed)
 " OUTPATIENT PHYSICAL THERAPY LOWER EXTREMITY TREATMENT   Patient Name: Tiffany Hawkins MRN: [id number] DOB:1946/02/19, 79 y.o., female Today's Date: 04/28/2024  END OF SESSION:  PT End of Session - 04/28/24 1300     Visit Number 3    Number of Visits 12    Date for Recertification  06/02/24    PT Start Time 1301    PT Stop Time 1352    PT Time Calculation (min) 51 min    Equipment Utilized During Treatment Gait belt    Activity Tolerance Patient tolerated treatment well;Patient limited by fatigue;Patient limited by pain    Behavior During Therapy WFL for tasks assessed/performed          Past Medical History:  Diagnosis Date   Anxiety    Atherosclerosis of aorta    Colon cancer (HCC) 10/12/2014   Stage IIB; had chemo   Diabetes mellitus without complication (HCC)    Hyperlipidemia    Hypertension    Intractable episodic headache    Lateral epicondylitis    OSA on CPAP    OSA on CPAP    Personal history of chemotherapy    Polio    childhood   Skin cancer    Sqamous Cell, Lt Calf   Sleep apnea    CPAP   Uncontrolled diabetes mellitus with hyperglycemia [facility])    Past Surgical History:  Procedure Laterality Date   APPENDECTOMY     BACK SURGERY     CATARACT EXTRACTION W/PHACO Left 12/03/2017   Procedure: CATARACT EXTRACTION PHACO AND INTRAOCULAR LENS PLACEMENT (IOC) LEFT  DIABETIC;  Surgeon: Myrna Adine Anes, MD;  Location: [facility] SURGERY [facility]TR;  Service: Ophthalmology;  Laterality: Left;  Diabetic - insulin    CATARACT EXTRACTION W/PHACO Right 01/14/2018   Procedure: CATARACT EXTRACTION PHACO AND INTRAOCULAR LENS PLACEMENT (IOC) RIGHT DIABETIC;  Surgeon: Myrna Adine Anes, MD;  Location: [facility] SURGERY [facility];  Service: Ophthalmology;  Laterality: Right;  Diabetic - insulin    CERVICAL DISC SURGERY     CHOLECYSTECTOMY     COLON SURGERY     COLONOSCOPY     COLONOSCOPY N/A 07/03/2023   Procedure: COLONOSCOPY;  Surgeon: Unk Corinn Skiff, MD;  Location: [facility] SURGERY  CNTR;  Service: Endoscopy;  Laterality: N/A;  Diabetic   COLONOSCOPY WITH PROPOFOL  N/A 05/17/2015   Procedure: COLONOSCOPY WITH PROPOFOL ;  Surgeon: Lamar ONEIDA Holmes, MD;  Location: [facility] ENDOSCOPY;  Service: Endoscopy;  Laterality: N/A;   COLONOSCOPY WITH PROPOFOL  N/A 04/01/2018   Procedure: COLONOSCOPY WITH BIOPSY;  Surgeon: Jinny Carmine, MD;  Location: [facility] SURGERY [facility];  Service: Endoscopy;  Laterality: N/A;  Diabetic - insulin    EMBOLIZATION (CATH LAB) N/A 01/24/2021   Procedure: EMBOLIZATION;  Surgeon: Marea Selinda RAMAN, MD;  Location: [facility] INVASIVE CV LAB;  Service: Cardiovascular;  Laterality: N/A;   ESOPHAGOGASTRODUODENOSCOPY     HERNIA REPAIR     LOWER EXTREMITY VENOGRAPHY Left 01/24/2021   Procedure: LOWER EXTREMITY VENOGRAPHY;  Surgeon: Marea Selinda RAMAN, MD;  Location: [facility] INVASIVE CV LAB;  Service: Cardiovascular;  Laterality: Left;   POLYPECTOMY N/A 04/01/2018   Procedure: POLYPECTOMY;  Surgeon: Jinny Carmine, MD;  Location: [facility] SURGERY CNTR;  Service: Endoscopy;  Laterality: N/A;   POLYPECTOMY  07/03/2023   Procedure: POLYPECTOMY;  Surgeon: Unk Corinn Skiff, MD;  Location: [facility] SURGERY CNTR;  Service: Endoscopy;;   PORT A CATH INJECTION ([facility] HX)     Port has been removed   TUBAL LIGATION     Patient Active Problem List   Diagnosis Date  Noted   Positive ANA (antinuclear antibody) 04/25/2024   Microalbuminuria due to type 2 diabetes mellitus (HCC) 03/28/2024   Post-polio limb muscle weakness 03/28/2024   Chronic midline low back pain without sciatica 03/28/2024   Mixed stress and urge urinary incontinence 08/29/2023   Stage 3b chronic kidney disease (HCC) 07/30/2023   Hx of colonic polyps 07/03/2023   Polyp of descending colon 07/03/2023   Polyp of transverse colon 07/03/2023   Atherosclerosis of aorta 12/29/2020   Deformity of toe of left foot 03/28/2020   H/O multiple pulmonary nodules 09/22/2019   Chronic cough 09/22/2019   Perennial allergic rhinitis 09/17/2019    Acute upper respiratory infection 08/28/2019   Chronic fatigue 08/28/2019   Acute bronchitis 06/01/2019   Cough productive of yellow sputum 06/01/2019   Vasomotor rhinitis 05/01/2019   Chronic kidney disease, stage II (mild) 03/14/2019   Gastroesophageal reflux disease without esophagitis 03/14/2019   Intractable episodic headache 03/14/2019   Need for prophylactic vaccination with combined diphtheria-tetanus-pertussis (DTaP) vaccine 03/14/2019   Renal cyst 11/27/2018   Abnormality of gait and mobility 11/27/2018   Hepatic steatosis 10/31/2018   Elevated ferritin 10/31/2018   Vertigo 08/13/2018   Gastroenteritis 05/09/2018   Nausea 05/09/2018   Personal history of colon cancer    Polyp of sigmoid colon    Dysuria 02/23/2018   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 02/10/2018   Encounter for general adult medical examination with abnormal findings 02/10/2018   Acute non-recurrent pansinusitis 08/24/2017   Other headache syndrome 08/24/2017   Uncontrolled type 2 diabetes mellitus with hyperglycemia (HCC) 08/24/2017   Type 2 diabetes mellitus with stage 3b chronic kidney disease, with long-term current use of insulin  (HCC) 08/22/2017   Need for vaccination against Streptococcus pneumoniae using pneumococcal conjugate vaccine 13 08/22/2017   Hypertension associated with type 2 diabetes mellitus (HCC) 05/03/2017   Otalgia, bilateral 05/02/2017   Type 2 diabetes mellitus with hyperglycemia (HCC) 05/02/2017   Hyperlipidemia associated with type 2 diabetes mellitus (HCC) 05/02/2017   Right lower quadrant pain 06/29/2016   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   History of colon cancer 04/13/2015   PCP: Liana Fish, NP  REFERRING PROVIDER: [name], PA-C  REFERRING DIAG:  Diagnosis  R53.1 (ICD-10-CM) - Weakness  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 Is well known to PT clinic.  Pt. Reports no pain and states she has weakness in both legs and is not moving well.  Pt. Reports some falls but more near falls requiring UE assist on walls/ furniture.  Pt. Currently walking inside/ outside without use of assistive device.  Pt. has RW at home but not using.  Pt. Has shower chair/ grab bars.  Pt. States R leg is stronger than L leg.  Ascends stairs with L LE at church.   PERTINENT HISTORY: See MD note.  R hand dominant.    PAIN:  Are you having pain? No.   Pt. Reports increase in B knee pain with floor to waist pick up during Berg.    PRECAUTIONS: Fall  RED FLAGS: None   WEIGHT BEARING RESTRICTIONS: No  FALLS:  Has patient fallen in last 6 months? Yes. Number of falls no number given with most recent fall in bathroom while cleaning toilet.  LIVING ENVIRONMENT: Lives with: lives with their spouse Lives in: House/apartment Stairs: Yes: External: 2 steps; can reach both Has following equipment at home: Single point cane, Walker - 2 wheeled, shower chair, and Grab bars  OCCUPATION: Retired  PLOF: Needs assistance with ADLs  PATIENT GOALS: wants to walk like I am feeling I will not fall.   Hold her great grand kids while standing.    NEXT MD VISIT: PRN  OBJECTIVE:  Note: Objective measures were completed at Evaluation unless otherwise noted.  PATIENT SURVEYS:  LEFS:  23 out of 80 ABC:  61%  COGNITION: Overall cognitive status: Within functional limits for tasks assessed     SENSATION: Light touch: Impaired  (diminished sensation in R S2 dermatome).  Diminished L achilles reflex.    EDEMA:  Not assessed  MUSCLE LENGTH: Hamstrings: NT Thomas test: NT  POSTURE: rounded shoulders and forward head  PALPATION: NT  LOWER EXTREMITY ROM:  B LE AROM  WFL  LOWER EXTREMITY MMT:  MMT Right eval Left eval  Hip flexion 3+ 3+  Hip extension    Hip abduction 3 3  Hip adduction    Hip internal rotation    Hip external rotation    Knee flexion 3+ 3+  Knee extension 4 4  Ankle dorsiflexion 5 4  Ankle plantarflexion    Ankle inversion    Ankle eversion     (Blank rows = not tested)  LOWER EXTREMITY SPECIAL TESTS:  NT  FUNCTIONAL TESTS:  Berg balance test:  27/56  (high fall risk)  GAIT: Distance walked: in clinic Assistive device utilized: None Level of assistance: CGA Comments:  Staggered gait pattern while walking into PT clinic/ hallway without use of assistive device.  Reaches for walls/ //-bars with UE for safety to prevent LOB.                                                                                                  TREATMENT DATE: 04/28/2024  Subjective:   Pt is feeling pretty good this afternoon. Reported feeling a good bit of fatigue.  Reports no falls since last visit.  Pt is not using a rolling walker at home.  Pt. Had f/u with MD last week to discuss fatigue and is doing to be referred to Rheumatologist.    Neuro.mm.:  PT instructed pt. In use of rollator while walking in clinic.  Pt. Did not arrive with personal RW today.    Walking in //-bars: forward/backwards 5 laps each using blue line to increase BOS.  Moderate cuing for posture correction/ consistent step pattern.  Pt. Prefers to occasionally touch //-bars to check balance.  Challenged with backwards walking.    Walking in PT gym/ hallway with use of rollator with marked improvement in cadence/ occasional cuing to increase hip flexion to prevent shuffling gait.  Pt. Instructed to stay closer to rollator to prevent forward leaning.   See new HEP (handouts issued)  Sit to stand 1x10 from chair with CGA.  Tandem stance walking in // bars 2 laps with CGA.  Balance on Airex pad with CGA  without use of hands in // bars followed by 10 seconds eyes  closed.  Walking in PT gym/ hallway with use of rollator with marked improvement in cadence/ occasional cuing to increase hip flexion to prevent shuffling gait. Pt. Reported much fatigue throughout the session Pt. Ambulates with use of rollator from PT clinic to car.     PATIENT EDUCATION:  Education details: Fall risk/ use of Rollator Person educated: Patient and Spouse Education method: Medical Illustrator Education comprehension: verbalized understanding and returned demonstration  HOME EXERCISE PROGRAM: Access Code: [email or web address] URL: [email or web address] Date: 04/28/2024 Prepared by: Ozell Sero   Exercises - Sit to Stand with Counter Support  - 1 x daily - 5 x weekly - 2 sets - 12 reps - Standing March with Counter Support  - 1 x daily - 5 x weekly - 2 sets - 12 reps - Standing Hip Abduction with Counter Support  - 1 x daily - 5 x weekly - 2 sets - 12 reps - Standing Hip Extension with Counter Support  - 1 x daily - 5 x weekly - 2 sets - 12 reps - Supine Bridge  - 1 x daily - 5 x weekly - 2 sets - 12 reps    ASSESSMENT:  CLINICAL IMPRESSION: PT issued a starting HEP to focus on LE muscle strengthening/ endurance training. Pt presents with B LE weakness resulting in staggering gait pattern and needs a supported surface to balance. Pt benefits from SBA/CGA and use of rollator while walking in hallway/ outside.  Pt reported feeling very fatigued today multiple times throughout the session and required frequent rest breaks during tx. Pt has limited standing/ walking endurance. Pt. Will benefit from skilled PT services to develop exercise program to increase B LE muscle strength to improve balance and safety with functional mobility/ walking with least assistive device.     OBJECTIVE IMPAIRMENTS: Abnormal gait, decreased activity tolerance, decreased balance, decreased endurance, decreased mobility, difficulty walking, decreased strength, decreased safety  awareness, dizziness, impaired perceived functional ability, impaired sensation, improper body mechanics, and postural dysfunction.   ACTIVITY LIMITATIONS: carrying, lifting, bending, standing, squatting, stairs, transfers, bathing, and locomotion level  PARTICIPATION LIMITATIONS: cleaning, laundry, interpersonal relationship, shopping, community activity, and church  PERSONAL FACTORS: Fitness and Past/current experiences are also affecting patient's functional outcome.   REHAB POTENTIAL: Good  CLINICAL DECISION MAKING: Evolving/moderate complexity  EVALUATION COMPLEXITY: Moderate   GOALS: Goals reviewed with patient? Yes  SHORT TERM GOALS: Target date: 05/12/24 Pt. Independent with HEP to increase B LE muscle strength 1/2 muscle grade to improve standing tolerance/ independence with walking.   Baseline: see above Goal status: INITIAL  2.  Pt. Will consistently use RW/rollator with ambulation to decrease fall risk/ improve safety with walking.   Baseline:  pt. Not using assistive device (hesitant to use).   Goal status: INITIAL  LONG TERM GOALS: Target date: 06/02/24  Pt. Will increase LEFS to >40 out of 80 to improve functional mobility.   Baseline:  23 out of 80 Goal status: INITIAL  2.  Pt. Will increase Berg balance test to >40/56 to improve mobility/ decrease fall risk with least assistive device.  Baseline: 27/56  High fall risk. Goal status: INITIAL  3.  Pt. Will increase ABC to >75% to improve safety with walking/ decrease fall risk.   Baseline:  61% Goal status: INITIAL  PLAN:  PT FREQUENCY: 2x/week  PT DURATION: 6 weeks  PLANNED INTERVENTIONS: 97110-Therapeutic exercises, 97530- Therapeutic activity, [id number]- Neuromuscular re-education, 97535- Self  Care, [id number]- Manual therapy, [phone]- Gait training, Patient/Family education, Balance training, Stair training, Cryotherapy, and Moist heat  PLAN FOR NEXT SESSION: Standing tolerance/ TUG/ 10 meter walk  test   Ozell JAYSON Sero, PT, DPT # [id number] Rankin Gainer, SPT 04/28/2024, 5:21 PM  "

## 2024-04-29 LAB — HLA-B27 ANTIGEN

## 2024-04-29 LAB — C-REACTIVE PROTEIN: CRP: 11 mg/L — ABNORMAL HIGH (ref 0–10)

## 2024-04-29 LAB — SEDIMENTATION RATE: Sed Rate: 10 mm/h (ref 0–40)

## 2024-04-29 LAB — ANA W/REFLEX IF POSITIVE: Anti Nuclear Antibody (ANA): NEGATIVE

## 2024-04-29 LAB — RHEUMATOID FACTOR: Rheumatoid fact SerPl-aCnc: <10 [IU]/mL

## 2024-04-30 ENCOUNTER — Ambulatory Visit: Admitting: Physical Therapy

## 2024-04-30 ENCOUNTER — Encounter: Payer: Self-pay | Admitting: Physical Therapy

## 2024-04-30 DIAGNOSIS — M6281 Muscle weakness (generalized): Secondary | ICD-10-CM

## 2024-04-30 DIAGNOSIS — R2689 Other abnormalities of gait and mobility: Secondary | ICD-10-CM

## 2024-04-30 DIAGNOSIS — R269 Unspecified abnormalities of gait and mobility: Secondary | ICD-10-CM

## 2024-04-30 DIAGNOSIS — Z9181 History of falling: Secondary | ICD-10-CM

## 2024-04-30 NOTE — Therapy (Signed)
 " OUTPATIENT PHYSICAL THERAPY LOWER EXTREMITY TREATMENT   Patient Name: Tiffany Hawkins MRN: [id number] DOB:12-Jul-1945, 79 y.o., female Today's Date: 04/30/2024  END OF SESSION:  PT End of Session - 04/30/24 1302     Visit Number 4    Number of Visits 12    Date for Recertification  06/02/24    PT Start Time 1302    PT Stop Time 1354    PT Time Calculation (min) 52 min    Equipment Utilized During Treatment Gait belt    Activity Tolerance Patient tolerated treatment well;Patient limited by fatigue;Patient limited by pain    Behavior During Therapy WFL for tasks assessed/performed          Past Medical History:  Diagnosis Date   Anxiety    Atherosclerosis of aorta    Colon cancer (HCC) 10/12/2014   Stage IIB; had chemo   Diabetes mellitus without complication (HCC)    Hyperlipidemia    Hypertension    Intractable episodic headache    Lateral epicondylitis    OSA on CPAP    OSA on CPAP    Personal history of chemotherapy    Polio    childhood   Skin cancer    Sqamous Cell, Lt Calf   Sleep apnea    CPAP   Uncontrolled diabetes mellitus with hyperglycemia [facility])    Past Surgical History:  Procedure Laterality Date   APPENDECTOMY     BACK SURGERY     CATARACT EXTRACTION W/PHACO Left 12/03/2017   Procedure: CATARACT EXTRACTION PHACO AND INTRAOCULAR LENS PLACEMENT (IOC) LEFT  DIABETIC;  Surgeon: Myrna Adine Anes, MD;  Location: [facility];  Service: Ophthalmology;  Laterality: Left;  Diabetic - insulin    CATARACT EXTRACTION W/PHACO Right 01/14/2018   Procedure: CATARACT EXTRACTION PHACO AND INTRAOCULAR LENS PLACEMENT (IOC) RIGHT DIABETIC;  Surgeon: Myrna Adine Anes, MD;  Location: [facility];  Service: Ophthalmology;  Laterality: Right;  Diabetic - insulin    CERVICAL DISC SURGERY     CHOLECYSTECTOMY     COLON SURGERY     COLONOSCOPY     COLONOSCOPY N/A 07/03/2023   Procedure: COLONOSCOPY;  Surgeon: Unk Corinn Skiff, MD;  Location: [facility];  Service: Endoscopy;  Laterality: N/A;  Diabetic   COLONOSCOPY WITH PROPOFOL  N/A 05/17/2015   Procedure: COLONOSCOPY WITH PROPOFOL ;  Surgeon: Lamar ONEIDA Holmes, MD;  Location: [facility];  Service: Endoscopy;  Laterality: N/A;   COLONOSCOPY WITH PROPOFOL  N/A 04/01/2018   Procedure: COLONOSCOPY WITH BIOPSY;  Surgeon: Jinny Carmine, MD;  Location: [facility];  Service: Endoscopy;  Laterality: N/A;  Diabetic - insulin    EMBOLIZATION (CATH LAB) N/A 01/24/2021   Procedure: EMBOLIZATION;  Surgeon: Marea Selinda RAMAN, MD;  Location: [facility] INVASIVE CV LAB;  Service: Cardiovascular;  Laterality: N/A;   ESOPHAGOGASTRODUODENOSCOPY     HERNIA REPAIR     LOWER EXTREMITY VENOGRAPHY Left 01/24/2021   Procedure: LOWER EXTREMITY VENOGRAPHY;  Surgeon: Marea Selinda RAMAN, MD;  Location: [facility] INVASIVE CV LAB;  Service: Cardiovascular;  Laterality: Left;   POLYPECTOMY N/A 04/01/2018   Procedure: POLYPECTOMY;  Surgeon: Jinny Carmine, MD;  Location: [facility];  Service: Endoscopy;  Laterality: N/A;   POLYPECTOMY  07/03/2023   Procedure: POLYPECTOMY;  Surgeon: Unk Corinn Skiff, MD;  Location: [facility];  Service: Endoscopy;;   PORT A CATH INJECTION ([facility] HX)     Port has been removed   TUBAL LIGATION     Patient Active Problem List   Diagnosis Date  Noted   Positive ANA (antinuclear antibody) 04/25/2024   Microalbuminuria due to type 2 diabetes mellitus (HCC) 03/28/2024   Post-polio limb muscle weakness 03/28/2024   Chronic midline low back pain without sciatica 03/28/2024   Mixed stress and urge urinary incontinence 08/29/2023   Stage 3b chronic kidney disease (HCC) 07/30/2023   Hx of colonic polyps 07/03/2023   Polyp of descending colon 07/03/2023   Polyp of transverse colon 07/03/2023   Atherosclerosis of aorta 12/29/2020   Deformity of toe of left foot 03/28/2020   H/O multiple pulmonary nodules 09/22/2019   Chronic cough 09/22/2019   Perennial allergic rhinitis 09/17/2019    Acute upper respiratory infection 08/28/2019   Chronic fatigue 08/28/2019   Acute bronchitis 06/01/2019   Cough productive of yellow sputum 06/01/2019   Vasomotor rhinitis 05/01/2019   Chronic kidney disease, stage II (mild) 03/14/2019   Gastroesophageal reflux disease without esophagitis 03/14/2019   Intractable episodic headache 03/14/2019   Need for prophylactic vaccination with combined diphtheria-tetanus-pertussis (DTaP) vaccine 03/14/2019   Renal cyst 11/27/2018   Abnormality of gait and mobility 11/27/2018   Hepatic steatosis 10/31/2018   Elevated ferritin 10/31/2018   Vertigo 08/13/2018   Gastroenteritis 05/09/2018   Nausea 05/09/2018   Personal history of colon cancer    Polyp of sigmoid colon    Dysuria 02/23/2018   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 02/10/2018   Encounter for general adult medical examination with abnormal findings 02/10/2018   Acute non-recurrent pansinusitis 08/24/2017   Other headache syndrome 08/24/2017   Uncontrolled type 2 diabetes mellitus with hyperglycemia (HCC) 08/24/2017   Type 2 diabetes mellitus with stage 3b chronic kidney disease, with long-term current use of insulin  (HCC) 08/22/2017   Need for vaccination against Streptococcus pneumoniae using pneumococcal conjugate vaccine 13 08/22/2017   Hypertension associated with type 2 diabetes mellitus (HCC) 05/03/2017   Otalgia, bilateral 05/02/2017   Type 2 diabetes mellitus with hyperglycemia (HCC) 05/02/2017   Hyperlipidemia associated with type 2 diabetes mellitus (HCC) 05/02/2017   Right lower quadrant pain 06/29/2016   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   History of colon cancer 04/13/2015   PCP: Liana Fish, NP  REFERRING PROVIDER: [name], PA-C  REFERRING DIAG:  Diagnosis  R53.1 (ICD-10-CM) - Weakness  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 Is well known to PT clinic.  Pt. Reports no pain and states she has weakness in both legs and is not moving well.  Pt. Reports some falls but more near falls requiring UE assist on walls/ furniture.  Pt. Currently walking inside/ outside without use of assistive device.  Pt. has RW at home but not using.  Pt. Has shower chair/ grab bars.  Pt. States R leg is stronger than L leg.  Ascends stairs with L LE at church.   PERTINENT HISTORY: See MD note.  R hand dominant.    PAIN:  Are you having pain? No.   Pt. Reports increase in B knee pain with floor to waist pick up during Berg.    PRECAUTIONS: Fall  RED FLAGS: None   WEIGHT BEARING RESTRICTIONS: No  FALLS:  Has patient fallen in last 6 months? Yes. Number of falls no number given with most recent fall in bathroom while cleaning toilet.  LIVING ENVIRONMENT: Lives with: lives with their spouse Lives in: House/apartment Stairs: Yes: External: 2 steps; can reach both Has following equipment at home: Single point cane, Walker - 2 wheeled, shower chair, and Grab bars  OCCUPATION: Retired  PLOF: Needs assistance with ADLs  PATIENT GOALS: wants to walk like I am feeling I will not fall.   Hold her great grand kids while standing.    NEXT MD VISIT: PRN  OBJECTIVE:  Note: Objective measures were completed at Evaluation unless otherwise noted.  PATIENT SURVEYS:  LEFS:  23 out of 80 ABC:  61%  COGNITION: Overall cognitive status: Within functional limits for tasks assessed     SENSATION: Light touch: Impaired  (diminished sensation in R S2 dermatome).  Diminished L achilles reflex.    EDEMA:  Not assessed  MUSCLE LENGTH: Hamstrings: NT Thomas test: NT  POSTURE: rounded shoulders and forward head  PALPATION: NT  LOWER EXTREMITY ROM:  B LE AROM  WFL  LOWER EXTREMITY MMT:  MMT Right eval Left eval  Hip flexion 3+ 3+  Hip extension    Hip abduction 3 3  Hip adduction    Hip internal rotation    Hip external rotation    Knee flexion 3+ 3+  Knee extension 4 4  Ankle dorsiflexion 5 4  Ankle plantarflexion    Ankle inversion    Ankle eversion     (Blank rows = not tested)  LOWER EXTREMITY SPECIAL TESTS:  NT  FUNCTIONAL TESTS:  Berg balance test:  27/56  (high fall risk)  GAIT: Distance walked: in clinic Assistive device utilized: None Level of assistance: CGA Comments:  Staggered gait pattern while walking into PT clinic/ hallway without use of assistive device.  Reaches for walls/ //-bars with UE for safety to prevent LOB.                                                                                                  TREATMENT DATE: 04/30/2024  Subjective:   Pt reported feeling fatigue and legs feel weak today.  Reports no falls since last visit.  Pt is not using a rolling walker at home.  Pt. Saw MD for blood work recently for fatigue and blood levels reported normal and pt. Instructed to f/u with MD.  Pt reports pain in her lower back today (no subjective pain score).    Nustep bike Level 2 for 10 min for endurance.  Neuro.mm.:  Walking obstacle course in hallway with alternating cone taps, 6 step ups and Airex pads.  Tandem gait walking blue line in // bars 2 laps with CGA.  Recipricol gait UE/LE touches 3 laps in // with one arm UE as support on first lap no UE on last 2 laps with CGA.  Minimal use of UE to regain balance for final 2 laps.  Hallway walk with head turns looking for letters with CGA.  Completed without/ with use of rollator.  Marked improvement while using rollator.    Cone weave with rollator walker around 6 cones, 3 times down and back with CGA.   PT instructed  pt. In use of rollator while walking in clinic.  Pt. Did not arrive with personal RW today.     PATIENT EDUCATION:  Education  details: Fall risk/ use of Rollator Person educated: Patient and Spouse Education method: Medical Illustrator Education comprehension: verbalized understanding and returned demonstration  HOME EXERCISE PROGRAM: Access Code: [email or web address] URL: [email or web address] Date: 04/28/2024 Prepared by: Ozell Sero   Exercises - Sit to Stand with Counter Support  - 1 x daily - 5 x weekly - 2 sets - 12 reps - Standing March with Counter Support  - 1 x daily - 5 x weekly - 2 sets - 12 reps - Standing Hip Abduction with Counter Support  - 1 x daily - 5 x weekly - 2 sets - 12 reps - Standing Hip Extension with Counter Support  - 1 x daily - 5 x weekly - 2 sets - 12 reps - Supine Bridge  - 1 x daily - 5 x weekly - 2 sets - 12 reps    ASSESSMENT:  CLINICAL IMPRESSION: Pt ambulates with use of rollator with marked improvement in cadence/ occasional cuing to increase hip flexion to prevent shuffling gait.   Pt presents with B LE weakness and staggered gait pattern and needs supported surface to stay balanced. Pt reported feeling fatigued and that legs felt weak. Pt benefited from use of rollator walker (with CGA) which showed improvement in gait speed, confidence and step length during walking. Pt reported being tired a few times throughout the session requiring short seated rest breaks. Pt has limited standing/ walking endurance. Pt. Will benefit from skilled PT services to develop exercise program to increase B LE muscle strength to improve balance and safety with functional mobility/ walking with least assistive device.     OBJECTIVE IMPAIRMENTS: Abnormal gait, decreased activity tolerance, decreased balance, decreased endurance, decreased mobility, difficulty walking, decreased strength, decreased safety awareness, dizziness, impaired perceived functional ability, impaired sensation, improper body mechanics, and postural dysfunction.   ACTIVITY LIMITATIONS: carrying, lifting,  bending, standing, squatting, stairs, transfers, bathing, and locomotion level  PARTICIPATION LIMITATIONS: cleaning, laundry, interpersonal relationship, shopping, community activity, and church  PERSONAL FACTORS: Fitness and Past/current experiences are also affecting patient's functional outcome.   REHAB POTENTIAL: Good  CLINICAL DECISION MAKING: Evolving/moderate complexity  EVALUATION COMPLEXITY: Moderate   GOALS: Goals reviewed with patient? Yes  SHORT TERM GOALS: Target date: 05/12/24 Pt. Independent with HEP to increase B LE muscle strength 1/2 muscle grade to improve standing tolerance/ independence with walking.   Baseline: see above Goal status: INITIAL  2.  Pt. Will consistently use RW/rollator with ambulation to decrease fall risk/ improve safety with walking.   Baseline:  pt. Not using assistive device (hesitant to use).   Goal status: INITIAL  LONG TERM GOALS: Target date: 06/02/24  Pt. Will increase LEFS to >40 out of 80 to improve functional mobility.   Baseline:  23 out of 80 Goal status: INITIAL  2.  Pt. Will increase Berg balance test to >40/56 to improve mobility/ decrease fall risk with least assistive device.  Baseline: 27/56  High fall risk. Goal status: INITIAL  3.  Pt. Will increase ABC to >75% to improve safety with walking/ decrease fall risk.   Baseline:  61% Goal status: INITIAL  PLAN:  PT FREQUENCY: 2x/week  PT DURATION: 6 weeks  PLANNED INTERVENTIONS: 97110-Therapeutic exercises, 97530- Therapeutic activity, [id number]- Neuromuscular re-education, 97535- Self Care, [id number]- Manual therapy, [phone]- Gait training, Patient/Family education, Balance training, Stair training, Cryotherapy, and Moist  heat  PLAN FOR NEXT SESSION: Standing tolerance/ TUG/ 10 meter walk test   Ozell JAYSON Sero, PT, DPT # [phone] Rankin Gainer, SPT 04/30/2024, 3:10 PM  "

## 2024-05-05 ENCOUNTER — Ambulatory Visit: Admitting: Physical Therapy

## 2024-05-05 ENCOUNTER — Encounter: Payer: Self-pay | Admitting: Physical Therapy

## 2024-05-05 DIAGNOSIS — R2689 Other abnormalities of gait and mobility: Secondary | ICD-10-CM

## 2024-05-05 DIAGNOSIS — R269 Unspecified abnormalities of gait and mobility: Secondary | ICD-10-CM

## 2024-05-05 DIAGNOSIS — M6281 Muscle weakness (generalized): Secondary | ICD-10-CM | POA: Diagnosis not present

## 2024-05-05 DIAGNOSIS — Z9181 History of falling: Secondary | ICD-10-CM

## 2024-05-05 NOTE — Therapy (Signed)
 " OUTPATIENT PHYSICAL THERAPY LOWER EXTREMITY TREATMENT   Patient Name: Tiffany Hawkins MRN: [id number] DOB:Feb 24, 1946, 79 y.o., female Today's Date: 05/05/2024  END OF SESSION:  PT End of Session - 05/05/24 1300     Visit Number 5    Number of Visits 12    Date for Recertification  06/02/24    PT Start Time 1301    PT Stop Time 1347    PT Time Calculation (min) 46 min    Equipment Utilized During Treatment Gait belt    Activity Tolerance Patient tolerated treatment well;Patient limited by fatigue;Patient limited by pain    Behavior During Therapy WFL for tasks assessed/performed          Past Medical History:  Diagnosis Date   Anxiety    Atherosclerosis of aorta    Colon cancer (HCC) 10/12/2014   Stage IIB; had chemo   Diabetes mellitus without complication (HCC)    Hyperlipidemia    Hypertension    Intractable episodic headache    Lateral epicondylitis    OSA on CPAP    OSA on CPAP    Personal history of chemotherapy    Polio    childhood   Skin cancer    Sqamous Cell, Lt Calf   Sleep apnea    CPAP   Uncontrolled diabetes mellitus with hyperglycemia [facility])    Past Surgical History:  Procedure Laterality Date   APPENDECTOMY     BACK SURGERY     CATARACT EXTRACTION W/PHACO Left 12/03/2017   Procedure: CATARACT EXTRACTION PHACO AND INTRAOCULAR LENS PLACEMENT (IOC) LEFT  DIABETIC;  Surgeon: Myrna Adine Anes, MD;  Location: [facility];  Service: Ophthalmology;  Laterality: Left;  Diabetic - insulin    CATARACT EXTRACTION W/PHACO Right 01/14/2018   Procedure: CATARACT EXTRACTION PHACO AND INTRAOCULAR LENS PLACEMENT (IOC) RIGHT DIABETIC;  Surgeon: Myrna Adine Anes, MD;  Location: [facility];  Service: Ophthalmology;  Laterality: Right;  Diabetic - insulin    CERVICAL DISC SURGERY     CHOLECYSTECTOMY     COLON SURGERY     COLONOSCOPY     COLONOSCOPY N/A 07/03/2023   Procedure: COLONOSCOPY;  Surgeon: Unk Corinn Skiff, MD;  Location: [facility]  [facility];  Service: Endoscopy;  Laterality: N/A;  Diabetic   COLONOSCOPY WITH PROPOFOL  N/A 05/17/2015   Procedure: COLONOSCOPY WITH PROPOFOL ;  Surgeon: Lamar ONEIDA Holmes, MD;  Location: [facility] ENDOSCOPY;  Service: Endoscopy;  Laterality: N/A;   COLONOSCOPY WITH PROPOFOL  N/A 04/01/2018   Procedure: COLONOSCOPY WITH BIOPSY;  Surgeon: Jinny Carmine, MD;  Location: [facility];  Service: Endoscopy;  Laterality: N/A;  Diabetic - insulin    EMBOLIZATION (CATH LAB) N/A 01/24/2021   Procedure: EMBOLIZATION;  Surgeon: Marea Selinda RAMAN, MD;  Location: [facility] INVASIVE CV LAB;  Service: Cardiovascular;  Laterality: N/A;   ESOPHAGOGASTRODUODENOSCOPY     HERNIA REPAIR     LOWER EXTREMITY VENOGRAPHY Left 01/24/2021   Procedure: LOWER EXTREMITY VENOGRAPHY;  Surgeon: Marea Selinda RAMAN, MD;  Location: [facility] INVASIVE CV LAB;  Service: Cardiovascular;  Laterality: Left;   POLYPECTOMY N/A 04/01/2018   Procedure: POLYPECTOMY;  Surgeon: Jinny Carmine, MD;  Location: [facility];  Service: Endoscopy;  Laterality: N/A;   POLYPECTOMY  07/03/2023   Procedure: POLYPECTOMY;  Surgeon: Unk Corinn Skiff, MD;  Location: [facility];  Service: Endoscopy;;   PORT A CATH INJECTION ([facility] HX)     Port has been removed   TUBAL LIGATION     Patient Active Problem List   Diagnosis Date  Noted   Positive ANA (antinuclear antibody) 04/25/2024   Microalbuminuria due to type 2 diabetes mellitus (HCC) 03/28/2024   Post-polio limb muscle weakness 03/28/2024   Chronic midline low back pain without sciatica 03/28/2024   Mixed stress and urge urinary incontinence 08/29/2023   Stage 3b chronic kidney disease (HCC) 07/30/2023   Hx of colonic polyps 07/03/2023   Polyp of descending colon 07/03/2023   Polyp of transverse colon 07/03/2023   Atherosclerosis of aorta 12/29/2020   Deformity of toe of left foot 03/28/2020   H/O multiple pulmonary nodules 09/22/2019   Chronic cough 09/22/2019   Perennial allergic rhinitis 09/17/2019    Acute upper respiratory infection 08/28/2019   Chronic fatigue 08/28/2019   Acute bronchitis 06/01/2019   Cough productive of yellow sputum 06/01/2019   Vasomotor rhinitis 05/01/2019   Chronic kidney disease, stage II (mild) 03/14/2019   Gastroesophageal reflux disease without esophagitis 03/14/2019   Intractable episodic headache 03/14/2019   Need for prophylactic vaccination with combined diphtheria-tetanus-pertussis (DTaP) vaccine 03/14/2019   Renal cyst 11/27/2018   Abnormality of gait and mobility 11/27/2018   Hepatic steatosis 10/31/2018   Elevated ferritin 10/31/2018   Vertigo 08/13/2018   Gastroenteritis 05/09/2018   Nausea 05/09/2018   Personal history of colon cancer    Polyp of sigmoid colon    Dysuria 02/23/2018   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 02/10/2018   Encounter for general adult medical examination with abnormal findings 02/10/2018   Acute non-recurrent pansinusitis 08/24/2017   Other headache syndrome 08/24/2017   Uncontrolled type 2 diabetes mellitus with hyperglycemia (HCC) 08/24/2017   Type 2 diabetes mellitus with stage 3b chronic kidney disease, with long-term current use of insulin  (HCC) 08/22/2017   Need for vaccination against Streptococcus pneumoniae using pneumococcal conjugate vaccine 13 08/22/2017   Hypertension associated with type 2 diabetes mellitus (HCC) 05/03/2017   Otalgia, bilateral 05/02/2017   Type 2 diabetes mellitus with hyperglycemia (HCC) 05/02/2017   Hyperlipidemia associated with type 2 diabetes mellitus (HCC) 05/02/2017   Right lower quadrant pain 06/29/2016   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   History of colon cancer 04/13/2015   PCP: Liana Fish, NP  REFERRING PROVIDER: [name], PA-C  REFERRING DIAG:  Diagnosis  R53.1 (ICD-10-CM) - Weakness  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 Is well known to PT clinic.  Pt. Reports no pain and states she has weakness in both legs and is not moving well.  Pt. Reports some falls but more near falls requiring UE assist on walls/ furniture.  Pt. Currently walking inside/ outside without use of assistive device.  Pt. has RW at home but not using.  Pt. Has shower chair/ grab bars.  Pt. States R leg is stronger than L leg.  Ascends stairs with L LE at church.   PERTINENT HISTORY: See MD note.  R hand dominant.    PAIN:  Are you having pain? No.   Pt. Reports increase in B knee pain with floor to waist pick up during Berg.    PRECAUTIONS: Fall  RED FLAGS: None   WEIGHT BEARING RESTRICTIONS: No  FALLS:  Has patient fallen in last 6 months? Yes. Number of falls no number given with most recent fall in bathroom while cleaning toilet.  LIVING ENVIRONMENT: Lives with: lives with their spouse Lives in: House/apartment Stairs: Yes: External: 2 steps; can reach both Has following equipment at home: Single point cane, Walker - 2 wheeled, shower chair, and Grab bars  OCCUPATION: Retired  PLOF: Needs assistance with ADLs  PATIENT GOALS: wants to walk like I am feeling I will not fall.   Hold her great grand kids while standing.    NEXT MD VISIT: PRN  OBJECTIVE:  Note: Objective measures were completed at Evaluation unless otherwise noted.  PATIENT SURVEYS:  LEFS:  23 out of 80 ABC:  61%  COGNITION: Overall cognitive status: Within functional limits for tasks assessed     SENSATION: Light touch: Impaired  (diminished sensation in R S2 dermatome).  Diminished L achilles reflex.    EDEMA:  Not assessed  MUSCLE LENGTH: Hamstrings: NT Thomas test: NT  POSTURE: rounded shoulders and forward head  PALPATION: NT  LOWER EXTREMITY ROM:  B LE AROM  WFL  LOWER EXTREMITY MMT:  MMT Right eval Left eval  Hip flexion 3+ 3+  Hip extension    Hip abduction 3 3  Hip adduction    Hip internal rotation    Hip external rotation    Knee flexion 3+ 3+  Knee extension 4 4  Ankle dorsiflexion 5 4  Ankle plantarflexion    Ankle inversion    Ankle eversion     (Blank rows = not tested)  LOWER EXTREMITY SPECIAL TESTS:  NT  FUNCTIONAL TESTS:  Berg balance test:  27/56  (high fall risk)  GAIT: Distance walked: in clinic Assistive device utilized: None Level of assistance: CGA Comments:  Staggered gait pattern while walking into PT clinic/ hallway without use of assistive device.  Reaches for walls/ //-bars with UE for safety to prevent LOB.                                                                                                  TREATMENT DATE: 05/05/2024  Subjective:    Pt reports feeling good and having energy today. Pt reports no falls since last visit. Still waiting f/u call with MD about blood work last week. Reports not being able to go to church yesterday because of fatigue/ icy conditions.  Pt reports pain in her lower back today (no subjective pain score).    Neuro.mm.:  Walking in // bars: 3 laps over 1/5 hurdles with minimal use of UE. 4 laps tandem stance over airex pad with minimal use of UE. 3 laps lateral walking L/R over long airex pad with use of UE for balance. 2 laps over 6 in 12 hurdles (All exercises with CGA)  STS 2x10   Balance on Airex pad for 2 min without use of UE with CGA.  Followed by 15 sec eyes closed with CGA.  Recipricol gait UE/LE touches 3 laps in // with one arm UE as support on first lap no UE on last 2 laps with CGA.  Minimal use of UE to regain balance for final 2 laps.  Hallway walk with use of rollator walker. Walked approximately 210 ft (  with CGA)  Hallway walk with head turns looking for letters with CGA.  Completed with use of rollator.     PT instructed pt. In use of  rollator while walking in clinic.  Pt. Did not arrive with personal RW today.     PATIENT EDUCATION:  Education details: Fall risk/ use of Rollator Person educated: Patient and Spouse Education method: Medical Illustrator Education comprehension: verbalized understanding and returned demonstration  HOME EXERCISE PROGRAM: Access Code: [email or web address] URL: [email or web address] Date: 04/28/2024 Prepared by: Ozell Sero   Exercises - Sit to Stand with Counter Support  - 1 x daily - 5 x weekly - 2 sets - 12 reps - Standing March with Counter Support  - 1 x daily - 5 x weekly - 2 sets - 12 reps - Standing Hip Abduction with Counter Support  - 1 x daily - 5 x weekly - 2 sets - 12 reps - Standing Hip Extension with Counter Support  - 1 x daily - 5 x weekly - 2 sets - 12 reps - Supine Bridge  - 1 x daily - 5 x weekly - 2 sets - 12 reps    ASSESSMENT:  CLINICAL IMPRESSION: Pt is showing improvement of gait with use of rollator walker (with CGA) when moving about the clinic. Pt gait was less staggered, increase step length, and increase gait speed. when walking. Pt presents with reports of weakness in B LE and shuffling gait. Pt did not need as many breaks today, and showed improvement in single leg balance with reciprocal UE/LE touches. Pt showed the ability to catch themselves when they get off balance during that exercise. Pt. Will benefit from skilled PT services to develop exercise program to increase B LE muscle strength to improve balance and safety with functional mobility/ walking with least assistive device.    OBJECTIVE IMPAIRMENTS: Abnormal gait, decreased activity tolerance, decreased balance, decreased endurance, decreased mobility, difficulty walking, decreased strength, decreased safety awareness, dizziness, impaired perceived functional ability, impaired sensation, improper body mechanics, and postural dysfunction.   ACTIVITY LIMITATIONS: carrying, lifting,  bending, standing, squatting, stairs, transfers, bathing, and locomotion level  PARTICIPATION LIMITATIONS: cleaning, laundry, interpersonal relationship, shopping, community activity, and church  PERSONAL FACTORS: Fitness and Past/current experiences are also affecting patient's functional outcome.   REHAB POTENTIAL: Good  CLINICAL DECISION MAKING: Evolving/moderate complexity  EVALUATION COMPLEXITY: Moderate   GOALS: Goals reviewed with patient? Yes  SHORT TERM GOALS: Target date: 05/12/24 Pt. Independent with HEP to increase B LE muscle strength 1/2 muscle grade to improve standing tolerance/ independence with walking.   Baseline: see above Goal status: INITIAL  2.  Pt. Will consistently use RW/rollator with ambulation to decrease fall risk/ improve safety with walking.   Baseline:  pt. Not using assistive device (hesitant to use).   Goal status: INITIAL  LONG TERM GOALS: Target date: 06/02/24  Pt. Will increase LEFS to >40 out of 80 to improve functional mobility.   Baseline:  23 out of 80 Goal status: INITIAL  2.  Pt. Will increase Berg balance test to >40/56 to improve mobility/ decrease fall risk with least assistive device.  Baseline: 27/56  High fall risk. Goal status: INITIAL  3.  Pt. Will increase ABC to >75% to improve safety with walking/ decrease fall risk.   Baseline:  61% Goal status: INITIAL  PLAN:  PT FREQUENCY: 2x/week  PT DURATION: 6 weeks  PLANNED INTERVENTIONS: [id number]-Therapeutic exercises, 97530- Therapeutic activity, [id number]- Neuromuscular re-education, 97535- Self Care, [id number]- Manual therapy, [id number]-  Gait training, Patient/Family education, Balance training, Stair training, Cryotherapy, and Moist heat  PLAN FOR NEXT SESSION: Standing tolerance/ TUG/ 10 meter walk test   Ozell JAYSON Sero, PT, DPT # [phone] Rankin Gainer, SPT 05/05/2024, 5:38 PM  "

## 2024-05-07 ENCOUNTER — Ambulatory Visit: Admitting: Physical Therapy

## 2024-05-08 ENCOUNTER — Encounter: Payer: Self-pay | Admitting: Physical Therapy

## 2024-05-08 ENCOUNTER — Ambulatory Visit: Admitting: Physical Therapy

## 2024-05-08 DIAGNOSIS — Z9181 History of falling: Secondary | ICD-10-CM

## 2024-05-08 DIAGNOSIS — R2689 Other abnormalities of gait and mobility: Secondary | ICD-10-CM

## 2024-05-08 DIAGNOSIS — M6281 Muscle weakness (generalized): Secondary | ICD-10-CM | POA: Diagnosis not present

## 2024-05-08 DIAGNOSIS — R269 Unspecified abnormalities of gait and mobility: Secondary | ICD-10-CM

## 2024-05-08 NOTE — Therapy (Signed)
 " OUTPATIENT PHYSICAL THERAPY LOWER EXTREMITY TREATMENT   Patient Name: Tiffany Hawkins MRN: [id number] DOB:[birth year], 79 y.o., female Today's Date: 05/08/2024  END OF SESSION:  PT End of Session - 05/08/24 1318     Visit Number 6    Number of Visits 12    Date for Recertification  06/02/24    PT Start Time 1254    PT Stop Time 1350    PT Time Calculation (min) 56 min    Equipment Utilized During Treatment Gait belt    Activity Tolerance Patient tolerated treatment well;Patient limited by fatigue;Patient limited by pain    Behavior During Therapy WFL for tasks assessed/performed          Past Medical History:  Diagnosis Date   Anxiety    Atherosclerosis of aorta    Colon cancer (HCC) 10/12/2014   Stage IIB; had chemo   Diabetes mellitus without complication (HCC)    Hyperlipidemia    Hypertension    Intractable episodic headache    Lateral epicondylitis    OSA on CPAP    OSA on CPAP    Personal history of chemotherapy    Polio    childhood   Skin cancer    Sqamous Cell, Lt Calf   Sleep apnea    CPAP   Uncontrolled diabetes mellitus with hyperglycemia [facility])    Past Surgical History:  Procedure Laterality Date   APPENDECTOMY     BACK SURGERY     CATARACT EXTRACTION W/PHACO Left 12/03/2017   Procedure: CATARACT EXTRACTION PHACO AND INTRAOCULAR LENS PLACEMENT (IOC) LEFT  DIABETIC;  Surgeon: Myrna Adine Anes, MD;  Location: [facility];  Service: Ophthalmology;  Laterality: Left;  Diabetic - insulin    CATARACT EXTRACTION W/PHACO Right 01/14/2018   Procedure: CATARACT EXTRACTION PHACO AND INTRAOCULAR LENS PLACEMENT (IOC) RIGHT DIABETIC;  Surgeon: Myrna Adine Anes, MD;  Location: [facility];  Service: Ophthalmology;  Laterality: Right;  Diabetic - insulin    CERVICAL DISC SURGERY     CHOLECYSTECTOMY     COLON SURGERY     COLONOSCOPY     COLONOSCOPY N/A 07/03/2023   Procedure: COLONOSCOPY;  Surgeon: Unk Corinn Skiff, MD;  Location: [facility];  Service: Endoscopy;  Laterality: N/A;  Diabetic   COLONOSCOPY WITH PROPOFOL  N/A 05/17/2015   Procedure: COLONOSCOPY WITH PROPOFOL ;  Surgeon: Lamar ONEIDA Holmes, MD;  Location: [facility] ENDOSCOPY;  Service: Endoscopy;  Laterality: N/A;   COLONOSCOPY WITH PROPOFOL  N/A 04/01/2018   Procedure: COLONOSCOPY WITH BIOPSY;  Surgeon: Jinny Carmine, MD;  Location: [facility];  Service: Endoscopy;  Laterality: N/A;  Diabetic - insulin    EMBOLIZATION (CATH LAB) N/A 01/24/2021   Procedure: EMBOLIZATION;  Surgeon: Marea Selinda RAMAN, MD;  Location: [facility] INVASIVE CV LAB;  Service: Cardiovascular;  Laterality: N/A;   ESOPHAGOGASTRODUODENOSCOPY     HERNIA REPAIR     LOWER EXTREMITY VENOGRAPHY Left 01/24/2021   Procedure: LOWER EXTREMITY VENOGRAPHY;  Surgeon: Marea Selinda RAMAN, MD;  Location: [facility] INVASIVE CV LAB;  Service: Cardiovascular;  Laterality: Left;   POLYPECTOMY N/A 04/01/2018   Procedure: POLYPECTOMY;  Surgeon: Jinny Carmine, MD;  Location: [facility];  Service: Endoscopy;  Laterality: N/A;   POLYPECTOMY  07/03/2023   Procedure: POLYPECTOMY;  Surgeon: Unk Corinn Skiff, MD;  Location: [facility];  Service: Endoscopy;;   PORT A CATH INJECTION ([facility] HX)     Port has been removed   TUBAL LIGATION     Patient Active Problem List   Diagnosis Date  Noted   Positive ANA (antinuclear antibody) 04/25/2024   Microalbuminuria due to type 2 diabetes mellitus (HCC) 03/28/2024   Post-polio limb muscle weakness 03/28/2024   Chronic midline low back pain without sciatica 03/28/2024   Mixed stress and urge urinary incontinence 08/29/2023   Stage 3b chronic kidney disease (HCC) 07/30/2023   Hx of colonic polyps 07/03/2023   Polyp of descending colon 07/03/2023   Polyp of transverse colon 07/03/2023   Atherosclerosis of aorta 12/29/2020   Deformity of toe of left foot 03/28/2020   H/O multiple pulmonary nodules 09/22/2019   Chronic cough 09/22/2019   Perennial allergic rhinitis 09/17/2019    Acute upper respiratory infection 08/28/2019   Chronic fatigue 08/28/2019   Acute bronchitis 06/01/2019   Cough productive of yellow sputum 06/01/2019   Vasomotor rhinitis 05/01/2019   Chronic kidney disease, stage II (mild) 03/14/2019   Gastroesophageal reflux disease without esophagitis 03/14/2019   Intractable episodic headache 03/14/2019   Need for prophylactic vaccination with combined diphtheria-tetanus-pertussis (DTaP) vaccine 03/14/2019   Renal cyst 11/27/2018   Abnormality of gait and mobility 11/27/2018   Hepatic steatosis 10/31/2018   Elevated ferritin 10/31/2018   Vertigo 08/13/2018   Gastroenteritis 05/09/2018   Nausea 05/09/2018   Personal history of colon cancer    Polyp of sigmoid colon    Dysuria 02/23/2018   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 02/10/2018   Encounter for general adult medical examination with abnormal findings 02/10/2018   Acute non-recurrent pansinusitis 08/24/2017   Other headache syndrome 08/24/2017   Uncontrolled type 2 diabetes mellitus with hyperglycemia (HCC) 08/24/2017   Type 2 diabetes mellitus with stage 3b chronic kidney disease, with long-term current use of insulin  (HCC) 08/22/2017   Need for vaccination against Streptococcus pneumoniae using pneumococcal conjugate vaccine 13 08/22/2017   Hypertension associated with type 2 diabetes mellitus (HCC) 05/03/2017   Otalgia, bilateral 05/02/2017   Type 2 diabetes mellitus with hyperglycemia (HCC) 05/02/2017   Hyperlipidemia associated with type 2 diabetes mellitus (HCC) 05/02/2017   Right lower quadrant pain 06/29/2016   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   History of colon cancer 04/13/2015   PCP: Liana Fish, NP  REFERRING PROVIDER: [name], PA-C  REFERRING DIAG:  Diagnosis  R53.1 (ICD-10-CM) - Weakness  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 Is well known to PT clinic.  Pt. Reports no pain and states she has weakness in both legs and is not moving well.  Pt. Reports some falls but more near falls requiring UE assist on walls/ furniture.  Pt. Currently walking inside/ outside without use of assistive device.  Pt. has RW at home but not using.  Pt. Has shower chair/ grab bars.  Pt. States R leg is stronger than L leg.  Ascends stairs with L LE at church.   PERTINENT HISTORY: See MD note.  R hand dominant.    PAIN:  Are you having pain? No.   Pt. Reports increase in B knee pain with floor to waist pick up during Berg.    PRECAUTIONS: Fall  RED FLAGS: None   WEIGHT BEARING RESTRICTIONS: No  FALLS:  Has patient fallen in last 6 months? Yes. Number of falls no number given with most recent fall in bathroom while cleaning toilet.  LIVING ENVIRONMENT: Lives with: lives with their spouse Lives in: House/apartment Stairs: Yes: External: 2 steps; can reach both Has following equipment at home: Single point cane, Walker - 2 wheeled, shower chair, and Grab bars  OCCUPATION: Retired  PLOF: Needs assistance with ADLs  PATIENT GOALS: wants to walk like I am feeling I will not fall.   Hold her great grand kids while standing.    NEXT MD VISIT: PRN  OBJECTIVE:  Note: Objective measures were completed at Evaluation unless otherwise noted.  PATIENT SURVEYS:  LEFS:  23 out of 80 ABC:  61%  COGNITION: Overall cognitive status: Within functional limits for tasks assessed     SENSATION: Light touch: Impaired  (diminished sensation in R S2 dermatome).  Diminished L achilles reflex.    EDEMA:  Not assessed  MUSCLE LENGTH: Hamstrings: NT Thomas test: NT  POSTURE: rounded shoulders and forward head  PALPATION: NT  LOWER EXTREMITY ROM:  B LE AROM  WFL  LOWER EXTREMITY MMT:  MMT Right eval Left eval  Hip flexion 3+ 3+  Hip extension    Hip abduction 3 3  Hip adduction    Hip internal rotation    Hip external rotation    Knee flexion 3+ 3+  Knee extension 4 4  Ankle dorsiflexion 5 4  Ankle plantarflexion    Ankle inversion    Ankle eversion     (Blank rows = not tested)  LOWER EXTREMITY SPECIAL TESTS:  NT  FUNCTIONAL TESTS:  Berg balance test:  27/56  (high fall risk)  GAIT: Distance walked: in clinic Assistive device utilized: None Level of assistance: CGA Comments:  Staggered gait pattern while walking into PT clinic/ hallway without use of assistive device.  Reaches for walls/ //-bars with UE for safety to prevent LOB.                                                                                                  TREATMENT DATE: 05/08/2024  Subjective:    Pt reports feeling tired today. Pt reports no falls since last visit. Pt reports that she has not been able to sleep well due to sleep apnea machine issues. Going to see MD about sleep apnea machine.  Neuro.mm.:  Hallway walk with rollator walker. (35 ft x 6)  Walking in // bars: 3 laps march walking, Lateral walking 4 laps L/R. (Focusing on upright posture). Recipricol gait UE/LE touches 4 laps. ( All exercises with CGA).  STS 2x10 from gray chair  Balance on Airex pad for 1 min without use of UE eyes closed with CGA.   Recipricol gait UE/LE touches 4 laps in //  with CGA.  Minimal use of UE to regain balance.  Hallway obstacle course with cone taps/ hurdles/ 6 and 12 step ups/ Airex pad (18ft x 4 laps) focusing on balance, leg clearance, postural control.  HHA with initial 6 step ups/ downs for safety.    Magnet fishing 2x standing in // bars, 1 set with airex pad.  In //-bars with CGA for safety.  1 LOB with self-correction.  Pt was walked to car with use of rollator and PT to ensure safe transfer  PATIENT EDUCATION:  Education details: Fall  risk/ use of Rollator Person educated: Patient and Spouse Education method: Medical Illustrator Education comprehension: verbalized understanding and returned demonstration  HOME EXERCISE PROGRAM: Access Code: [email or web address] URL: [email or web address] Date: 04/28/2024 Prepared by: Ozell Sero   Exercises - Sit to Stand with Counter Support  - 1 x daily - 5 x weekly - 2 sets - 12 reps - Standing March with Counter Support  - 1 x daily - 5 x weekly - 2 sets - 12 reps - Standing Hip Abduction with Counter Support  - 1 x daily - 5 x weekly - 2 sets - 12 reps - Standing Hip Extension with Counter Support  - 1 x daily - 5 x weekly - 2 sets - 12 reps - Supine Bridge  - 1 x daily - 5 x weekly - 2 sets - 12 reps    ASSESSMENT:  CLINICAL IMPRESSION: Pt is showing improvement in gait when walking and is more confident with rollator with CGA for safety.  Pt. Is showing good heel strike and toe off. Pt did well with obstacle course and required HHA for initial step ups/ downs. Pt showed improvement stepping up on stair step not needing hand assist on 2nd go round. Pt needed wall to stabilize during obstacle course but did not use it on 2nd round of course. Pt is motivated to complete exercise.  Pt showed the ability to catch themselves when they get off balance during that exercise. Pt. Will benefit from skilled PT services to develop exercise program to increase B LE muscle strength to improve balance and safety with functional mobility/ walking with least assistive device.     OBJECTIVE IMPAIRMENTS: Abnormal gait, decreased activity tolerance, decreased balance, decreased endurance, decreased mobility, difficulty walking, decreased strength, decreased safety awareness, dizziness, impaired perceived functional ability, impaired sensation, improper body mechanics, and postural dysfunction.   ACTIVITY LIMITATIONS: carrying, lifting, bending, standing, squatting, stairs, transfers,  bathing, and locomotion level  PARTICIPATION LIMITATIONS: cleaning, laundry, interpersonal relationship, shopping, community activity, and church  PERSONAL FACTORS: Fitness and Past/current experiences are also affecting patient's functional outcome.   REHAB POTENTIAL: Good  CLINICAL DECISION MAKING: Evolving/moderate complexity  EVALUATION COMPLEXITY: Moderate   GOALS: Goals reviewed with patient? Yes  SHORT TERM GOALS: Target date: 05/12/24 Pt. Independent with HEP to increase B LE muscle strength 1/2 muscle grade to improve standing tolerance/ independence with walking.   Baseline: see above Goal status: INITIAL  2.  Pt. Will consistently use RW/rollator with ambulation to decrease fall risk/ improve safety with walking.   Baseline:  pt. Not using assistive device (hesitant to use).   Goal status: INITIAL  LONG TERM GOALS: Target date: 06/02/24  Pt. Will increase LEFS to >40 out of 80 to improve functional mobility.   Baseline:  23 out of 80 Goal status: INITIAL  2.  Pt. Will increase Berg balance test to >40/56 to improve mobility/ decrease fall risk with least assistive device.  Baseline: 27/56  High fall risk. Goal status: INITIAL  3.  Pt. Will increase ABC to >75% to improve safety with walking/ decrease fall risk.   Baseline:  61% Goal status: INITIAL  PLAN:  PT FREQUENCY: 2x/week  PT DURATION: 6 weeks  PLANNED INTERVENTIONS: 97110-Therapeutic exercises, 97530- Therapeutic activity, [id number]- Neuromuscular re-education, 97535- Self Care, [id number]- Manual therapy, [phone]- Gait training, Patient/Family education, Balance training, Stair training, Cryotherapy, and  Moist heat  PLAN FOR NEXT SESSION: Standing tolerance/ TUG/ 10 meter walk test.  CHECK for signed MD cert for rollator   Ozell JAYSON Sero, PT, DPT # [phone] Rankin Gainer, SPT 05/08/2024, 2:21 PM  "

## 2024-05-12 ENCOUNTER — Ambulatory Visit: Admitting: Physical Therapy

## 2024-05-13 ENCOUNTER — Telehealth: Payer: Self-pay | Admitting: Internal Medicine

## 2024-05-13 ENCOUNTER — Telehealth: Payer: Self-pay | Admitting: Nurse Practitioner

## 2024-05-13 NOTE — Telephone Encounter (Signed)
 error

## 2024-05-13 NOTE — Telephone Encounter (Signed)
 SS and initial office visit faxed to [facility]; [phone]

## 2024-05-14 ENCOUNTER — Ambulatory Visit: Admitting: Physical Therapy

## 2024-05-14 DIAGNOSIS — M6281 Muscle weakness (generalized): Secondary | ICD-10-CM | POA: Diagnosis not present

## 2024-05-14 DIAGNOSIS — R269 Unspecified abnormalities of gait and mobility: Secondary | ICD-10-CM

## 2024-05-14 DIAGNOSIS — R2689 Other abnormalities of gait and mobility: Secondary | ICD-10-CM

## 2024-05-14 DIAGNOSIS — Z9181 History of falling: Secondary | ICD-10-CM

## 2024-05-14 NOTE — Therapy (Unsigned)
 " OUTPATIENT PHYSICAL THERAPY LOWER EXTREMITY TREATMENT   Patient Name: Tiffany Hawkins MRN: [id number] DOB:12/11/1945, 79 y.o., female Today's Date: 05/14/2024  END OF SESSION:  PT End of Session - 05/14/24 1251     Visit Number 7    Number of Visits 12    Date for Recertification  06/02/24    PT Start Time 1252    PT Stop Time 1342    PT Time Calculation (min) 50 min    Equipment Utilized During Treatment Gait belt    Activity Tolerance Patient tolerated treatment well;Patient limited by fatigue;Patient limited by pain    Behavior During Therapy WFL for tasks assessed/performed          Past Medical History:  Diagnosis Date   Anxiety    Atherosclerosis of aorta    Colon cancer (HCC) 10/12/2014   Stage IIB; had chemo   Diabetes mellitus without complication (HCC)    Hyperlipidemia    Hypertension    Intractable episodic headache    Lateral epicondylitis    OSA on CPAP    OSA on CPAP    Personal history of chemotherapy    Polio    childhood   Skin cancer    Sqamous Cell, Lt Calf   Sleep apnea    CPAP   Uncontrolled diabetes mellitus with hyperglycemia [facility])    Past Surgical History:  Procedure Laterality Date   APPENDECTOMY     BACK SURGERY     CATARACT EXTRACTION W/PHACO Left 12/03/2017   Procedure: CATARACT EXTRACTION PHACO AND INTRAOCULAR LENS PLACEMENT (IOC) LEFT  DIABETIC;  Surgeon: Myrna Adine Anes, MD;  Location: [facility];  Service: Ophthalmology;  Laterality: Left;  Diabetic - insulin    CATARACT EXTRACTION W/PHACO Right 01/14/2018   Procedure: CATARACT EXTRACTION PHACO AND INTRAOCULAR LENS PLACEMENT (IOC) RIGHT DIABETIC;  Surgeon: Myrna Adine Anes, MD;  Location: [facility];  Service: Ophthalmology;  Laterality: Right;  Diabetic - insulin    CERVICAL DISC SURGERY     CHOLECYSTECTOMY     COLON SURGERY     COLONOSCOPY     COLONOSCOPY N/A 07/03/2023   Procedure: COLONOSCOPY;  Surgeon: Unk Corinn Skiff, MD;  Location: [facility] SURG[facility]  [facility];  Service: Endoscopy;  Laterality: N/A;  Diabetic   COLONOSCOPY WITH PROPOFOL  N/A 05/17/2015   Procedure: COLONOSCOPY WITH PROPOFOL ;  Surgeon: Lamar ONEIDA Holmes, MD;  Location: [facility] ENDOSCOPY;  Service: Endoscopy;  Laterality: N/A;   COLONOSCOPY WITH PROPOFOL  N/A 04/01/2018   Procedure: COLONOSCOPY WITH BIOPSY;  Surgeon: Jinny Carmine, MD;  Location: [facility];  Service: Endoscopy;  Laterality: N/A;  Diabetic - insulin    EMBOLIZATION (CATH LAB) N/A 01/24/2021   Procedure: EMBOLIZATION;  Surgeon: Marea Selinda RAMAN, MD;  Location: [facility] INVASIVE CV LAB;  Service: Cardiovascular;  Laterality: N/A;   ESOPHAGOGASTRODUODENOSCOPY     HERNIA REPAIR     LOWER EXTREMITY VENOGRAPHY Left 01/24/2021   Procedure: LOWER EXTREMITY VENOGRAPHY;  Surgeon: Marea Selinda RAMAN, MD;  Location: [facility] INVASIVE CV LAB;  Service: Cardiovascular;  Laterality: Left;   POLYPECTOMY N/A 04/01/2018   Procedure: POLYPECTOMY;  Surgeon: Jinny Carmine, MD;  Location: [facility] SURGERY [facility];  Service: Endoscopy;  Laterality: N/A;   POLYPECTOMY  07/03/2023   Procedure: POLYPECTOMY;  Surgeon: Unk Corinn Skiff, MD;  Location: [facility] SURGERY CN[facility];  Service: Endoscopy;;   PORT A CATH INJECTION ([facility] HX)     Port has been removed   TUBAL LIGATION     Patient Active Problem List   Diagnosis Date  Noted   Positive ANA (antinuclear antibody) 04/25/2024   Microalbuminuria due to type 2 diabetes mellitus (HCC) 03/28/2024   Post-polio limb muscle weakness 03/28/2024   Chronic midline low back pain without sciatica 03/28/2024   Mixed stress and urge urinary incontinence 08/29/2023   Stage 3b chronic kidney disease (HCC) 07/30/2023   Hx of colonic polyps 07/03/2023   Polyp of descending colon 07/03/2023   Polyp of transverse colon 07/03/2023   Atherosclerosis of aorta 12/29/2020   Deformity of toe of left foot 03/28/2020   H/O multiple pulmonary nodules 09/22/2019   Chronic cough 09/22/2019   Perennial allergic rhinitis 09/17/2019    Acute upper respiratory infection 08/28/2019   Chronic fatigue 08/28/2019   Acute bronchitis 06/01/2019   Cough productive of yellow sputum 06/01/2019   Vasomotor rhinitis 05/01/2019   Chronic kidney disease, stage II (mild) 03/14/2019   Gastroesophageal reflux disease without esophagitis 03/14/2019   Intractable episodic headache 03/14/2019   Need for prophylactic vaccination with combined diphtheria-tetanus-pertussis (DTaP) vaccine 03/14/2019   Renal cyst 11/27/2018   Abnormality of gait and mobility 11/27/2018   Hepatic steatosis 10/31/2018   Elevated ferritin 10/31/2018   Vertigo 08/13/2018   Gastroenteritis 05/09/2018   Nausea 05/09/2018   Personal history of colon cancer    Polyp of sigmoid colon    Dysuria 02/23/2018   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 02/10/2018   Encounter for general adult medical examination with abnormal findings 02/10/2018   Acute non-recurrent pansinusitis 08/24/2017   Other headache syndrome 08/24/2017   Uncontrolled type 2 diabetes mellitus with hyperglycemia (HCC) 08/24/2017   Type 2 diabetes mellitus with stage 3b chronic kidney disease, with long-term current use of insulin  (HCC) 08/22/2017   Need for vaccination against Streptococcus pneumoniae using pneumococcal conjugate vaccine 13 08/22/2017   Hypertension associated with type 2 diabetes mellitus (HCC) 05/03/2017   Otalgia, bilateral 05/02/2017   Type 2 diabetes mellitus with hyperglycemia (HCC) 05/02/2017   Hyperlipidemia associated with type 2 diabetes mellitus (HCC) 05/02/2017   Right lower quadrant pain 06/29/2016   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   History of colon cancer 04/13/2015   PCP: Liana Fish, NP  REFERRING PROVIDER: [name], PA-C  REFERRING DIAG:  Diagnosis  R53.1 (ICD-10-CM) - Weakness  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 Is well known to PT clinic.  Pt. Reports no pain and states she has weakness in both legs and is not moving well.  Pt. Reports some falls but more near falls requiring UE assist on walls/ furniture.  Pt. Currently walking inside/ outside without use of assistive device.  Pt. has RW at home but not using.  Pt. Has shower chair/ grab bars.  Pt. States R leg is stronger than L leg.  Ascends stairs with L LE at church.   PERTINENT HISTORY: See MD note.  R hand dominant.    PAIN:  Are you having pain? No.   Pt. Reports increase in B knee pain with floor to waist pick up during Berg.    PRECAUTIONS: Fall  RED FLAGS: None   WEIGHT BEARING RESTRICTIONS: No  FALLS:  Has patient fallen in last 6 months? Yes. Number of falls no number given with most recent fall in bathroom while cleaning toilet.  LIVING ENVIRONMENT: Lives with: lives with their spouse Lives in: House/apartment Stairs: Yes: External: 2 steps; can reach both Has following equipment at home: Single point cane, Walker - 2 wheeled, shower chair, and Grab bars  OCCUPATION: Retired  PLOF: Needs assistance with ADLs  PATIENT GOALS: wants to walk like I am feeling I will not fall.   Hold her great grand kids while standing.    NEXT MD VISIT: PRN  OBJECTIVE:  Note: Objective measures were completed at Evaluation unless otherwise noted.  PATIENT SURVEYS:  LEFS:  23 out of 80 ABC:  61%  COGNITION: Overall cognitive status: Within functional limits for tasks assessed     SENSATION: Light touch: Impaired  (diminished sensation in R S2 dermatome).  Diminished L achilles reflex.    EDEMA:  Not assessed  MUSCLE LENGTH: Hamstrings: NT Thomas test: NT  POSTURE: rounded shoulders and forward head  PALPATION: NT  LOWER EXTREMITY ROM:  B LE AROM  WFL  LOWER EXTREMITY MMT:  MMT 05/14/24 Right eval Left eval  Hip flexion 3+ 4  Hip extension    Hip abduction 3 3  Hip adduction    Hip internal rotation    Hip external rotation    Knee flexion 4 4  Knee extension 4 4  Ankle dorsiflexion 5 4  Ankle plantarflexion    Ankle inversion    Ankle eversion     (Blank rows = not tested)  LOWER EXTREMITY SPECIAL TESTS:  NT  FUNCTIONAL TESTS:  Berg balance test:  27/56  (high fall risk)  GAIT: Distance walked: in clinic Assistive device utilized: None Level of assistance: CGA Comments:  Staggered gait pattern while walking into PT clinic/ hallway without use of assistive device.  Reaches for walls/ //-bars with UE for safety to prevent LOB.                                                                                                  TREATMENT DATE: 05/14/2024  Subjective:   Pt reports feeling good with high energy, Pt reports no falls since last visit. Pt also reports that she was able to get a new sleep apnea mask which has helped her sleep issues. Pt reports that she has been exercising every day.  Neuro.mm  10 meter walk test 15.71 sec, 14.56 sec, Average: 15.14 sec (With CGA) 10 meter walk test with rollator walker 12.36 sec, 11.57 sec. Average: 11.97 (With CGA)  TUG test 13.93 sec, 13,56 sec, Average 13.75 (With CGA) TUG With rollator walker 12.67 sec, 10.74 sec, Average 11.71 (With CGA)  Reassessment of LE MMT: MMT 05/14/24 Right eval Left eval  Hip flexion 3+ 4  Hip extension    Hip abduction 3 3  Hip adduction    Hip internal rotation    Hip external rotation    Knee flexion 4 4  Knee extension 4 4  Ankle dorsiflexion 5 4  Ankle plantarflexion    Ankle inversion    Ankle eversion     (Blank rows = not tested)  Up and down stairs (4 stairs) 3x with UE  assist for balance and step up)   In //bars. Lateral walking 4 laps L/R. (Focusing on upright posture with no UE assist). Recipricol gait UE/LE touches 4  laps. (Use of UE to reagin balance). Tandem stance walking 3 laps followed by tandem stance walking over airex pad. (Some use of UE to regain balance). Hurdle walks over 6 and 12 hurdles (With one UE assist) ( All exercises with CGA)  Pt was walked to car with use of rollator and PT to ensure safe transfer  PATIENT EDUCATION:  Education details: Fall risk/ use of Rollator Person educated: Patient and Spouse Education method: Medical Illustrator Education comprehension: verbalized understanding and returned demonstration  HOME EXERCISE PROGRAM: Access Code: [email or web address] URL: [email or web address] Date: 04/28/2024 Prepared by: Ozell Sero   Exercises - Sit to Stand with Counter Support  - 1 x daily - 5 x weekly - 2 sets - 12 reps - Standing March with Counter Support  - 1 x daily - 5 x weekly - 2 sets - 12 reps - Standing Hip Abduction with Counter Support  - 1 x daily - 5 x weekly - 2 sets - 12 reps - Standing Hip Extension with Counter Support  - 1 x daily - 5 x weekly - 2 sets - 12 reps - Supine Bridge  - 1 x daily - 5 x weekly - 2 sets - 12 reps    ASSESSMENT:  CLINICAL IMPRESSION: Pt is improving well in gait with rollator walker. Pt improved in time in 10 meter walk test as well as TUG test with the rollator walker. Pt gait also showed improvement it speed as well as less shuffling, and good heel strike with walker. Pt required less rest breaks than last session. In ascending stairs patient need handrails for balance and strength assist. Pt is motivated to complete exercise. Pt is awaiting insurance order for rollator walker and would benefit from it for balance and ambulation. Pt. Will benefit from skilled PT services to develop exercise program to increase B LE muscle strength to improve balance and safety with functional mobility/ walking with least assistive device  OBJECTIVE IMPAIRMENTS: Abnormal gait, decreased activity tolerance, decreased balance,  decreased endurance, decreased mobility, difficulty walking, decreased strength, decreased safety awareness, dizziness, impaired perceived functional ability, impaired sensation, improper body mechanics, and postural dysfunction.   ACTIVITY LIMITATIONS: carrying, lifting, bending, standing, squatting, stairs, transfers, bathing, and locomotion level  PARTICIPATION LIMITATIONS: cleaning, laundry, interpersonal relationship, shopping, community activity, and church  PERSONAL FACTORS: Fitness and Past/current experiences are also affecting patient's functional outcome.   REHAB POTENTIAL: Good  CLINICAL DECISION MAKING: Evolving/moderate complexity  EVALUATION COMPLEXITY: Moderate   GOALS: Goals reviewed with patient? Yes  SHORT TERM GOALS: Target date: 05/12/24 Pt. Independent with HEP to increase B LE muscle strength 1/2 muscle grade to improve standing tolerance/ independence with walking.   Baseline: see above  1/28: see above Goal status: Partially met  2.  Pt. Will consistently use RW/rollator with ambulation to decrease fall risk/ improve safety with walking.   Baseline:  pt. Not using assistive device (hesitant to use).  1/28: pt. Demonstrates good use of rollator in PT clinic/ gym. Goal status: Partially met  LONG TERM GOALS: Target date: 06/02/24  Pt. Will increase LEFS to >40 out of 80 to improve functional mobility.   Baseline:  23 out of 80 Goal status: INITIAL  2.  Pt. Will increase Berg balance test to >40/56 to improve mobility/ decrease fall risk with least assistive device.  Baseline: 27/56  High fall risk. Goal status: INITIAL  3.  Pt. Will increase ABC to >75% to improve safety with walking/ decrease fall risk.   Baseline:  61% Goal status: INITIAL  PLAN:  PT FREQUENCY: 2x/week  PT DURATION: 6 weeks  PLANNED INTERVENTIONS: 97110-Therapeutic exercises, 97530- Therapeutic activity, [id number]- Neuromuscular re-education, 97535- Self Care, [id number]- Manual therapy,  [phone]- Gait training, Patient/Family education, Balance training, Stair training, Cryotherapy, and Moist heat  PLAN FOR NEXT SESSION: CHECK for signed MD cert for rollator.  Progress HEP   Ozell JAYSON Sero, PT, DPT # [id number] [place] Gainer, SPT 05/14/2024, 3:32 PM  "

## 2024-05-15 ENCOUNTER — Encounter: Payer: Self-pay | Admitting: Physical Therapy

## 2024-05-15 ENCOUNTER — Telehealth: Payer: Self-pay | Admitting: Nurse Practitioner

## 2024-05-15 ENCOUNTER — Ambulatory Visit: Payer: Self-pay | Admitting: Nurse Practitioner

## 2024-05-15 DIAGNOSIS — R7982 Elevated C-reactive protein (CRP): Secondary | ICD-10-CM

## 2024-05-15 DIAGNOSIS — M545 Low back pain, unspecified: Secondary | ICD-10-CM

## 2024-05-15 DIAGNOSIS — M6281 Muscle weakness (generalized): Secondary | ICD-10-CM

## 2024-05-15 DIAGNOSIS — R5382 Chronic fatigue, unspecified: Secondary | ICD-10-CM

## 2024-05-15 NOTE — Telephone Encounter (Signed)
 Rheumatology referral sent via [facility] to [facility]. Notified patient. Gave patient telephone # [phone]

## 2024-05-19 ENCOUNTER — Ambulatory Visit: Admitting: Physical Therapy

## 2024-05-21 ENCOUNTER — Encounter: Payer: Self-pay | Admitting: Physical Therapy

## 2024-05-21 ENCOUNTER — Ambulatory Visit: Admitting: Physical Therapy

## 2024-05-21 DIAGNOSIS — R2689 Other abnormalities of gait and mobility: Secondary | ICD-10-CM

## 2024-05-21 DIAGNOSIS — M6281 Muscle weakness (generalized): Secondary | ICD-10-CM

## 2024-05-21 DIAGNOSIS — Z9181 History of falling: Secondary | ICD-10-CM

## 2024-05-21 DIAGNOSIS — R269 Unspecified abnormalities of gait and mobility: Secondary | ICD-10-CM

## 2024-05-21 NOTE — Therapy (Signed)
 " OUTPATIENT PHYSICAL THERAPY LOWER EXTREMITY TREATMENT   Patient Name: Tiffany Hawkins MRN: [id number] DOB:03-21-1946, 79 y.o., female Today's Date: 05/21/2024  END OF SESSION:  PT End of Session - 05/21/24 1359     Visit Number 8    Number of Visits 12    Date for Recertification  06/02/24    PT Start Time 1300    PT Stop Time 1350    PT Time Calculation (min) 50 min    Equipment Utilized During Treatment Gait belt    Activity Tolerance Patient tolerated treatment well;Patient limited by fatigue;Patient limited by pain    Behavior During Therapy WFL for tasks assessed/performed          Past Medical History:  Diagnosis Date   Anxiety    Atherosclerosis of aorta    Colon cancer (HCC) 10/12/2014   Stage IIB; had chemo   Diabetes mellitus without complication (HCC)    Hyperlipidemia    Hypertension    Intractable episodic headache    Lateral epicondylitis    OSA on CPAP    OSA on CPAP    Personal history of chemotherapy    Polio    childhood   Skin cancer    Sqamous Cell, Lt Calf   Sleep apnea    CPAP   Uncontrolled diabetes mellitus with hyperglycemia [facility])    Past Surgical History:  Procedure Laterality Date   APPENDECTOMY     BACK SURGERY     CATARACT EXTRACTION W/PHACO Left 12/03/2017   Procedure: CATARACT EXTRACTION PHACO AND INTRAOCULAR LENS PLACEMENT (IOC) LEFT  DIABETIC;  Surgeon: Myrna Adine Anes, MD;  Location: [facility];  Service: Ophthalmology;  Laterality: Left;  Diabetic - insulin    CATARACT EXTRACTION W/PHACO Right 01/14/2018   Procedure: CATARACT EXTRACTION PHACO AND INTRAOCULAR LENS PLACEMENT (IOC) RIGHT DIABETIC;  Surgeon: Myrna Adine Anes, MD;  Location: [facility] SURGERY [facility];  Service: Ophthalmology;  Laterality: Right;  Diabetic - insulin    CERVICAL DISC SURGERY     CHOLECYSTECTOMY     COLON SURGERY     COLONOSCOPY     COLONOSCOPY N/A 07/03/2023   Procedure: COLONOSCOPY;  Surgeon: Unk Corinn Skiff, MD;  Location: [facility] SURG[facility]  [facility];  Service: Endoscopy;  Laterality: N/A;  Diabetic   COLONOSCOPY WITH PROPOFOL  N/A 05/17/2015   Procedure: COLONOSCOPY WITH PROPOFOL ;  Surgeon: Lamar ONEIDA Holmes, MD;  Location: [facility] ENDOSCOPY;  Service: Endoscopy;  Laterality: N/A;   COLONOSCOPY WITH PROPOFOL  N/A 04/01/2018   Procedure: COLONOSCOPY WITH BIOPSY;  Surgeon: Jinny Carmine, MD;  Location: [facility] SURGERY CNTR;  Service: Endoscopy;  Laterality: N/A;  Diabetic - insulin    EMBOLIZATION (CATH LAB) N/A 01/24/2021   Procedure: EMBOLIZATION;  Surgeon: Marea Selinda RAMAN, MD;  Location: [facility] INVASIVE CV LAB;  Service: Cardiovascular;  Laterality: N/A;   ESOPHAGOGASTRODUODENOSCOPY     HERNIA REPAIR     LOWER EXTREMITY VENOGRAPHY Left 01/24/2021   Procedure: LOWER EXTREMITY VENOGRAPHY;  Surgeon: Marea Selinda RAMAN, MD;  Location: [facility] INVASIVE CV LAB;  Service: Cardiovascular;  Laterality: Left;   POLYPECTOMY N/A 04/01/2018   Procedure: POLYPECTOMY;  Surgeon: Jinny Carmine, MD;  Location: [facility] SURGERY CNTR;  Service: Endoscopy;  Laterality: N/A;   POLYPECTOMY  07/03/2023   Procedure: POLYPECTOMY;  Surgeon: Unk Corinn Skiff, MD;  Location: [facility] SURGERY CNTR;  Service: Endoscopy;;   PORT A CATH INJECTION ([facility] HX)     Port has been removed   TUBAL LIGATION     Patient Active Problem List   Diagnosis Date  Noted   Positive ANA (antinuclear antibody) 04/25/2024   Microalbuminuria due to type 2 diabetes mellitus (HCC) 03/28/2024   Post-polio limb muscle weakness 03/28/2024   Chronic midline low back pain without sciatica 03/28/2024   Mixed stress and urge urinary incontinence 08/29/2023   Stage 3b chronic kidney disease (HCC) 07/30/2023   Hx of colonic polyps 07/03/2023   Polyp of descending colon 07/03/2023   Polyp of transverse colon 07/03/2023   Atherosclerosis of aorta 12/29/2020   Deformity of toe of left foot 03/28/2020   H/O multiple pulmonary nodules 09/22/2019   Chronic cough 09/22/2019   Perennial allergic rhinitis 09/17/2019    Acute upper respiratory infection 08/28/2019   Chronic fatigue 08/28/2019   Acute bronchitis 06/01/2019   Cough productive of yellow sputum 06/01/2019   Vasomotor rhinitis 05/01/2019   Chronic kidney disease, stage II (mild) 03/14/2019   Gastroesophageal reflux disease without esophagitis 03/14/2019   Intractable episodic headache 03/14/2019   Need for prophylactic vaccination with combined diphtheria-tetanus-pertussis (DTaP) vaccine 03/14/2019   Renal cyst 11/27/2018   Abnormality of gait and mobility 11/27/2018   Hepatic steatosis 10/31/2018   Elevated ferritin 10/31/2018   Vertigo 08/13/2018   Gastroenteritis 05/09/2018   Nausea 05/09/2018   Personal history of colon cancer    Polyp of sigmoid colon    Dysuria 02/23/2018   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 02/10/2018   Encounter for general adult medical examination with abnormal findings 02/10/2018   Acute non-recurrent pansinusitis 08/24/2017   Other headache syndrome 08/24/2017   Uncontrolled type 2 diabetes mellitus with hyperglycemia (HCC) 08/24/2017   Type 2 diabetes mellitus with stage 3b chronic kidney disease, with long-term current use of insulin  (HCC) 08/22/2017   Need for vaccination against Streptococcus pneumoniae using pneumococcal conjugate vaccine 13 08/22/2017   Hypertension associated with type 2 diabetes mellitus (HCC) 05/03/2017   Otalgia, bilateral 05/02/2017   Type 2 diabetes mellitus with hyperglycemia (HCC) 05/02/2017   Hyperlipidemia associated with type 2 diabetes mellitus (HCC) 05/02/2017   Right lower quadrant pain 06/29/2016   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   History of colon cancer 04/13/2015   PCP: Liana Fish, NP  REFERRING PROVIDER: [name], PA-C  REFERRING DIAG:  Diagnosis  R53.1 (ICD-10-CM) - Weakness  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 Is well known to PT clinic.  Pt. Reports no pain and states she has weakness in both legs and is not moving well.  Pt. Reports some falls but more near falls requiring UE assist on walls/ furniture.  Pt. Currently walking inside/ outside without use of assistive device.  Pt. has RW at home but not using.  Pt. Has shower chair/ grab bars.  Pt. States R leg is stronger than L leg.  Ascends stairs with L LE at church.   PERTINENT HISTORY: See MD note.  R hand dominant.    PAIN:  Are you having pain? No.   Pt. Reports increase in B knee pain with floor to waist pick up during Berg.    PRECAUTIONS: Fall  RED FLAGS: None   WEIGHT BEARING RESTRICTIONS: No  FALLS:  Has patient fallen in last 6 months? Yes. Number of falls no number given with most recent fall in bathroom while cleaning toilet.  LIVING ENVIRONMENT: Lives with: lives with their spouse Lives in: House/apartment Stairs: Yes: External: 2 steps; can reach both Has following equipment at home: Single point cane, Walker - 2 wheeled, shower chair, and Grab bars  OCCUPATION: Retired  PLOF: Needs assistance with ADLs  PATIENT GOALS: wants to walk like I am feeling I will not fall.   Hold her great grand kids while standing.    NEXT MD VISIT: PRN  OBJECTIVE:  Note: Objective measures were completed at Evaluation unless otherwise noted.  PATIENT SURVEYS:  LEFS:  23 out of 80 ABC:  61%  COGNITION: Overall cognitive status: Within functional limits for tasks assessed     SENSATION: Light touch: Impaired  (diminished sensation in R S2 dermatome).  Diminished L achilles reflex.    EDEMA:  Not assessed  MUSCLE LENGTH: Hamstrings: NT Thomas test: NT  POSTURE: rounded shoulders and forward head  PALPATION: NT  LOWER EXTREMITY ROM:  B LE AROM  WFL  LOWER EXTREMITY MMT:  MMT 05/14/24 Right eval Left eval  Hip flexion 3+ 4  Hip extension    Hip abduction 3 3  Hip adduction    Hip internal rotation    Hip external rotation    Knee flexion 4 4  Knee extension 4 4  Ankle dorsiflexion 5 4  Ankle plantarflexion    Ankle inversion    Ankle eversion     (Blank rows = not tested)  LOWER EXTREMITY SPECIAL TESTS:  NT  FUNCTIONAL TESTS:  Berg balance test:  27/56  (high fall risk)  GAIT: Distance walked: in clinic Assistive device utilized: None Level of assistance: CGA Comments:  Staggered gait pattern while walking into PT clinic/ hallway without use of assistive device.  Reaches for walls/ //-bars with UE for safety to prevent LOB.                                                                                                  TREATMENT DATE: 05/21/2024  Subjective:   Pt reports feeling ok today. Feeling tired due to issues with sleep apnea mask. Pt is going to f/u to see what the issue tomorrow with MD. Pt reports no falls since last visit. Pt had f/u with MD this morning to discuss blood work.  Pt reports that she did her exercises today.  Neuro.mm  Obstacle course 2x. With winding around cones, stair step up, Walking over airex pad, and step over 4, 6 hurdles and 1, 12 hurdle. (With CGA) ( Pt required PT assist to step, up over stair and 12 hurdle 1st time)   Reassessment of Berg 05/21/24: 38 out of 56  In //bars. Tandem stance walking 3 laps followed. 1st trip with UE assist. 2nd-3rd laps with no UE assist. (Pt required UE assist to regain balance) (With CGA)  Walking in hallway for about 140 ft with no AD. Walking in hallway with rolator walker  219ft. ( Pt cued to have good heel strike and toe off) (With CGA)   B single leg balance 2x30 sec with UE assist.  Pt was walked to car with  use of rollator and PT to ensure safe transfer.  PT reinforced benefits of rollator with all aspects of walking.    PATIENT  EDUCATION:  Education details: Fall risk/ use of Rollator Person educated: Patient and Spouse Education method: Medical Illustrator Education comprehension: verbalized understanding and returned demonstration  HOME EXERCISE PROGRAM: Access Code: [email or web address] URL: [email or web address] Date: 04/28/2024 Prepared by: Ozell Sero   Exercises - Sit to Stand with Counter Support  - 1 x daily - 5 x weekly - 2 sets - 12 reps - Standing March with Counter Support  - 1 x daily - 5 x weekly - 2 sets - 12 reps - Standing Hip Abduction with Counter Support  - 1 x daily - 5 x weekly - 2 sets - 12 reps - Standing Hip Extension with Counter Support  - 1 x daily - 5 x weekly - 2 sets - 12 reps - Supine Bridge  - 1 x daily - 5 x weekly - 2 sets - 12 reps    ASSESSMENT:  CLINICAL IMPRESSION: Pt did well with exercise today.  Pt's berg was reassessed which was 38/56 and was a improvement from baseline. Pt presents with shuffling gait and LE weakness and benefits from cuing/ instruction with rollator. Pt showed improvement with 2nd round of obstacle course not needing PT assist to get over stair step. Pt gait was faster and more confident with rollator. Pt is awaiting insurance order for rollator walker and would benefit from it for balance and ambulation. Pt. Will benefit from skilled PT services to develop exercise program to increase B LE muscle strength to improve balance and safety with functional mobility/ walking with least assistive device   OBJECTIVE IMPAIRMENTS: Abnormal gait, decreased activity tolerance, decreased balance, decreased endurance, decreased mobility, difficulty walking, decreased strength, decreased safety awareness, dizziness, impaired perceived functional ability, impaired sensation, improper body mechanics, and postural dysfunction.   ACTIVITY LIMITATIONS: carrying, lifting, bending, standing, squatting, stairs, transfers, bathing, and locomotion  level  PARTICIPATION LIMITATIONS: cleaning, laundry, interpersonal relationship, shopping, community activity, and church  PERSONAL FACTORS: Fitness and Past/current experiences are also affecting patient's functional outcome.   REHAB POTENTIAL: Good  CLINICAL DECISION MAKING: Evolving/moderate complexity  EVALUATION COMPLEXITY: Moderate   GOALS: Goals reviewed with patient? Yes  SHORT TERM GOALS: Target date: 05/12/24 Pt. Independent with HEP to increase B LE muscle strength 1/2 muscle grade to improve standing tolerance/ independence with walking.   Baseline: see above  1/28: see above Goal status: Partially met  2.  Pt. Will consistently use RW/rollator with ambulation to decrease fall risk/ improve safety with walking.   Baseline:  pt. Not using assistive device (hesitant to use).  1/28: pt. Demonstrates good use of rollator in PT clinic/ gym. Goal status: Partially met  LONG TERM GOALS: Target date: 06/02/24  Pt. Will increase LEFS to >40 out of 80 to improve functional mobility.   Baseline:  23 out of 80 Goal status: INITIAL  2.  Pt. Will increase Berg balance test to >40/56 to improve mobility/ decrease fall risk with least assistive device.  Baseline: 27/56  High fall risk. 05/21/24: 38/56 significant fall risk. Goal status: On-going  3.  Pt. Will increase ABC to >75% to improve safety with walking/ decrease fall risk.   Baseline:  61% Goal status: INITIAL  PLAN:  PT FREQUENCY: 2x/week  PT DURATION: 6 weeks  PLANNED INTERVENTIONS: 97110-Therapeutic exercises, 97530- Therapeutic activity, [id number]- Neuromuscular re-education, 97535- Self Care, [id number]- Manual therapy, [phone]- Gait training, Patient/Family  education, Balance training, Stair training, Cryotherapy, and Moist heat  PLAN FOR NEXT SESSION: CHECK for signed MD cert for rollator.  CHECK GOALS   Ozell JAYSON Sero, PT, DPT # [id number] Rankin Gainer, SPT 05/21/2024, 3:30 PM  "

## 2024-05-26 ENCOUNTER — Ambulatory Visit: Admitting: Physical Therapy

## 2024-05-28 ENCOUNTER — Ambulatory Visit: Admitting: Physical Therapy

## 2024-09-26 ENCOUNTER — Ambulatory Visit: Admitting: Nurse Practitioner

## 2025-03-31 ENCOUNTER — Ambulatory Visit: Admitting: Nurse Practitioner
